# Patient Record
Sex: Female | Born: 1950 | Race: Black or African American | Hispanic: No | Marital: Married | Smoking: Former smoker
Health system: Southern US, Community
[De-identification: ages and names within clinical notes are randomized; demographics above are authoritative.]

## PROBLEM LIST (undated history)

## (undated) DIAGNOSIS — E785 Hyperlipidemia, unspecified: Secondary | ICD-10-CM

## (undated) DIAGNOSIS — M1990 Unspecified osteoarthritis, unspecified site: Secondary | ICD-10-CM

## (undated) DIAGNOSIS — M4316 Spondylolisthesis, lumbar region: Secondary | ICD-10-CM

## (undated) DIAGNOSIS — I10 Essential (primary) hypertension: Secondary | ICD-10-CM

## (undated) DIAGNOSIS — R159 Full incontinence of feces: Secondary | ICD-10-CM

## (undated) DIAGNOSIS — Z973 Presence of spectacles and contact lenses: Secondary | ICD-10-CM

## (undated) DIAGNOSIS — N186 End stage renal disease: Secondary | ICD-10-CM

## (undated) DIAGNOSIS — Z9289 Personal history of other medical treatment: Secondary | ICD-10-CM

## (undated) DIAGNOSIS — Z87898 Personal history of other specified conditions: Secondary | ICD-10-CM

## (undated) DIAGNOSIS — Z87448 Personal history of other diseases of urinary system: Secondary | ICD-10-CM

## (undated) DIAGNOSIS — H401130 Primary open-angle glaucoma, bilateral, stage unspecified: Secondary | ICD-10-CM

## (undated) DIAGNOSIS — M48061 Spinal stenosis, lumbar region without neurogenic claudication: Secondary | ICD-10-CM

## (undated) DIAGNOSIS — H409 Unspecified glaucoma: Secondary | ICD-10-CM

## (undated) DIAGNOSIS — E119 Type 2 diabetes mellitus without complications: Secondary | ICD-10-CM

## (undated) DIAGNOSIS — M545 Low back pain, unspecified: Secondary | ICD-10-CM

## (undated) DIAGNOSIS — Z972 Presence of dental prosthetic device (complete) (partial): Secondary | ICD-10-CM

## (undated) DIAGNOSIS — N3946 Mixed incontinence: Secondary | ICD-10-CM

## (undated) DIAGNOSIS — N189 Chronic kidney disease, unspecified: Secondary | ICD-10-CM

## (undated) DIAGNOSIS — M810 Age-related osteoporosis without current pathological fracture: Secondary | ICD-10-CM

## (undated) DIAGNOSIS — D631 Anemia in chronic kidney disease: Secondary | ICD-10-CM

## (undated) DIAGNOSIS — G8929 Other chronic pain: Secondary | ICD-10-CM

## (undated) DIAGNOSIS — D849 Immunodeficiency, unspecified: Secondary | ICD-10-CM

## (undated) DIAGNOSIS — K219 Gastro-esophageal reflux disease without esophagitis: Secondary | ICD-10-CM

## (undated) DIAGNOSIS — N3941 Urge incontinence: Secondary | ICD-10-CM

## (undated) DIAGNOSIS — R55 Syncope and collapse: Secondary | ICD-10-CM

## (undated) DIAGNOSIS — N952 Postmenopausal atrophic vaginitis: Secondary | ICD-10-CM

## (undated) HISTORY — DX: Anemia in chronic kidney disease: D63.1

## (undated) HISTORY — PX: TUBAL LIGATION: SHX77

## (undated) HISTORY — DX: Gastro-esophageal reflux disease without esophagitis: K21.9

## (undated) HISTORY — PX: CATARACT EXTRACTION: SUR2

## (undated) HISTORY — DX: Hyperlipidemia, unspecified: E78.5

## (undated) HISTORY — DX: Urge incontinence: N39.41

## (undated) HISTORY — DX: Type 2 diabetes mellitus without complications: E11.9

## (undated) HISTORY — DX: Postmenopausal atrophic vaginitis: N95.2

## (undated) HISTORY — DX: End stage renal disease: N18.6

## (undated) HISTORY — PX: REFRACTIVE SURGERY: SHX103

## (undated) HISTORY — DX: Unspecified osteoarthritis, unspecified site: M19.90

## (undated) HISTORY — PX: NEPHRECTOMY TRANSPLANTED ORGAN: SUR880

## (undated) HISTORY — DX: Chronic kidney disease, unspecified: N18.9

---
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
## 1999-10-03 DIAGNOSIS — H409 Unspecified glaucoma: Secondary | ICD-10-CM

## 1999-10-03 DIAGNOSIS — H401133 Primary open-angle glaucoma, bilateral, severe stage: Secondary | ICD-10-CM | POA: Insufficient documentation

## 2003-07-27 ENCOUNTER — Encounter: Payer: Self-pay | Admitting: Family Medicine

## 2003-07-27 ENCOUNTER — Other Ambulatory Visit: Admission: RE | Admit: 2003-07-27 | Discharge: 2003-07-27 | Payer: Self-pay | Admitting: Internal Medicine

## 2004-06-13 ENCOUNTER — Encounter: Payer: Self-pay | Admitting: Family Medicine

## 2004-06-13 LAB — CONVERTED CEMR LAB: Hgb A1c MFr Bld: 6.7 %

## 2004-08-05 ENCOUNTER — Ambulatory Visit: Payer: Self-pay | Admitting: Family Medicine

## 2004-08-05 LAB — CONVERTED CEMR LAB: Hgb A1c MFr Bld: 6.5 %

## 2004-08-08 ENCOUNTER — Ambulatory Visit: Payer: Self-pay | Admitting: Family Medicine

## 2004-09-08 ENCOUNTER — Ambulatory Visit: Payer: Self-pay | Admitting: Family Medicine

## 2004-09-09 ENCOUNTER — Encounter: Payer: Self-pay | Admitting: Family Medicine

## 2004-09-09 LAB — CONVERTED CEMR LAB: Hgb A1c MFr Bld: 6.1 %

## 2004-10-21 ENCOUNTER — Ambulatory Visit: Payer: Self-pay | Admitting: Family Medicine

## 2004-11-21 ENCOUNTER — Encounter: Payer: Self-pay | Admitting: Family Medicine

## 2004-12-05 ENCOUNTER — Ambulatory Visit: Payer: Self-pay | Admitting: Family Medicine

## 2004-12-07 ENCOUNTER — Ambulatory Visit: Payer: Self-pay | Admitting: Family Medicine

## 2004-12-12 ENCOUNTER — Ambulatory Visit: Payer: Self-pay | Admitting: Family Medicine

## 2005-03-07 ENCOUNTER — Ambulatory Visit: Payer: Self-pay | Admitting: Family Medicine

## 2005-03-07 LAB — CONVERTED CEMR LAB: Hgb A1c MFr Bld: 5.6 %

## 2005-03-20 ENCOUNTER — Ambulatory Visit: Payer: Self-pay | Admitting: Family Medicine

## 2005-06-15 ENCOUNTER — Ambulatory Visit: Payer: Self-pay | Admitting: Family Medicine

## 2005-06-20 ENCOUNTER — Ambulatory Visit: Payer: Self-pay | Admitting: Family Medicine

## 2005-07-02 ENCOUNTER — Encounter: Payer: Self-pay | Admitting: Family Medicine

## 2005-07-02 LAB — CONVERTED CEMR LAB: Microalbumin U total vol: 105.5 mg/L

## 2005-07-28 ENCOUNTER — Ambulatory Visit: Payer: Self-pay | Admitting: Family Medicine

## 2005-07-28 LAB — CONVERTED CEMR LAB: Hgb A1c MFr Bld: 5.8 %

## 2005-08-01 ENCOUNTER — Ambulatory Visit: Payer: Self-pay | Admitting: Family Medicine

## 2005-10-17 ENCOUNTER — Ambulatory Visit: Payer: Self-pay | Admitting: Family Medicine

## 2005-11-10 ENCOUNTER — Ambulatory Visit: Payer: Self-pay | Admitting: Family Medicine

## 2005-11-24 ENCOUNTER — Ambulatory Visit: Payer: Self-pay | Admitting: Family Medicine

## 2005-11-27 ENCOUNTER — Ambulatory Visit: Payer: Self-pay | Admitting: Family Medicine

## 2006-01-10 ENCOUNTER — Ambulatory Visit: Payer: Self-pay | Admitting: Family Medicine

## 2006-03-12 ENCOUNTER — Ambulatory Visit: Payer: Self-pay | Admitting: Family Medicine

## 2006-04-26 ENCOUNTER — Encounter: Admission: RE | Admit: 2006-04-26 | Discharge: 2006-04-26 | Payer: Self-pay | Admitting: Orthopaedic Surgery

## 2006-05-31 ENCOUNTER — Encounter: Admission: RE | Admit: 2006-05-31 | Discharge: 2006-05-31 | Payer: Self-pay | Admitting: Orthopaedic Surgery

## 2006-06-22 ENCOUNTER — Encounter: Admission: RE | Admit: 2006-06-22 | Discharge: 2006-06-22 | Payer: Self-pay | Admitting: *Deleted

## 2006-10-25 ENCOUNTER — Ambulatory Visit: Payer: Self-pay | Admitting: Family Medicine

## 2006-11-06 ENCOUNTER — Ambulatory Visit: Payer: Self-pay | Admitting: Family Medicine

## 2007-01-03 ENCOUNTER — Encounter: Payer: Self-pay | Admitting: Family Medicine

## 2007-01-03 DIAGNOSIS — M1990 Unspecified osteoarthritis, unspecified site: Secondary | ICD-10-CM | POA: Insufficient documentation

## 2007-07-10 ENCOUNTER — Encounter (INDEPENDENT_AMBULATORY_CARE_PROVIDER_SITE_OTHER): Payer: Self-pay | Admitting: *Deleted

## 2007-07-11 ENCOUNTER — Ambulatory Visit: Payer: Self-pay | Admitting: Family Medicine

## 2007-07-26 ENCOUNTER — Encounter: Payer: Self-pay | Admitting: Family Medicine

## 2007-07-29 ENCOUNTER — Encounter (INDEPENDENT_AMBULATORY_CARE_PROVIDER_SITE_OTHER): Payer: Self-pay | Admitting: *Deleted

## 2007-10-28 ENCOUNTER — Ambulatory Visit: Payer: Self-pay | Admitting: Family Medicine

## 2007-10-28 LAB — CONVERTED CEMR LAB
Albumin: 4.1 g/dL (ref 3.5–5.2)
BUN: 45 mg/dL — ABNORMAL HIGH (ref 6–23)
CO2: 24 meq/L (ref 19–32)
Calcium: 9.9 mg/dL (ref 8.4–10.5)
Chloride: 105 meq/L (ref 96–112)
Creatinine, Ser: 2.4 mg/dL — ABNORMAL HIGH (ref 0.4–1.2)
Creatinine,U: 254.8 mg/dL
Direct LDL: 133.5 mg/dL
GFR calc non Af Amer: 22 mL/min
Hgb A1c MFr Bld: 6.4 % — ABNORMAL HIGH (ref 4.6–6.0)
Microalb Creat Ratio: 305.7 mg/g — ABNORMAL HIGH (ref 0.0–30.0)
Microalb, Ur: 77.9 mg/dL — ABNORMAL HIGH (ref 0.0–1.9)
Triglycerides: 139 mg/dL (ref 0–149)

## 2007-10-30 ENCOUNTER — Ambulatory Visit: Payer: Self-pay | Admitting: Family Medicine

## 2007-10-30 ENCOUNTER — Encounter: Payer: Self-pay | Admitting: Family Medicine

## 2007-10-30 ENCOUNTER — Other Ambulatory Visit: Admission: RE | Admit: 2007-10-30 | Discharge: 2007-10-30 | Payer: Self-pay | Admitting: Family Medicine

## 2007-10-30 LAB — CONVERTED CEMR LAB: Pap Smear: NORMAL

## 2007-11-04 ENCOUNTER — Encounter (INDEPENDENT_AMBULATORY_CARE_PROVIDER_SITE_OTHER): Payer: Self-pay | Admitting: *Deleted

## 2007-11-19 LAB — CONVERTED CEMR LAB: OCCULT 3: NEGATIVE

## 2007-12-06 ENCOUNTER — Ambulatory Visit: Payer: Self-pay | Admitting: Family Medicine

## 2007-12-09 ENCOUNTER — Ambulatory Visit: Payer: Self-pay | Admitting: Family Medicine

## 2007-12-09 ENCOUNTER — Encounter (INDEPENDENT_AMBULATORY_CARE_PROVIDER_SITE_OTHER): Payer: Self-pay | Admitting: *Deleted

## 2007-12-15 LAB — CONVERTED CEMR LAB: AST: 23 units/L (ref 0–37)

## 2008-01-27 ENCOUNTER — Ambulatory Visit: Payer: Self-pay | Admitting: Family Medicine

## 2008-01-27 LAB — CONVERTED CEMR LAB
BUN: 42 mg/dL — ABNORMAL HIGH (ref 6–23)
Cholesterol: 129 mg/dL (ref 0–200)
Creatinine, Ser: 2.3 mg/dL — ABNORMAL HIGH (ref 0.4–1.2)
GFR calc Af Amer: 28 mL/min
GFR calc non Af Amer: 23 mL/min
Glucose, Bld: 100 mg/dL — ABNORMAL HIGH (ref 70–99)
LDL Cholesterol: 75 mg/dL (ref 0–99)
Potassium: 3.9 meq/L (ref 3.5–5.1)
Triglycerides: 63 mg/dL (ref 0–149)
VLDL: 13 mg/dL (ref 0–40)

## 2008-02-03 ENCOUNTER — Ambulatory Visit: Payer: Self-pay | Admitting: Family Medicine

## 2008-02-18 ENCOUNTER — Ambulatory Visit: Payer: Self-pay | Admitting: Nephrology

## 2008-02-20 ENCOUNTER — Telehealth: Payer: Self-pay | Admitting: Family Medicine

## 2008-02-25 ENCOUNTER — Ambulatory Visit: Payer: Self-pay | Admitting: Family Medicine

## 2008-03-06 ENCOUNTER — Encounter: Payer: Self-pay | Admitting: Family Medicine

## 2008-03-19 ENCOUNTER — Encounter: Payer: Self-pay | Admitting: Family Medicine

## 2008-03-30 ENCOUNTER — Encounter: Payer: Self-pay | Admitting: Family Medicine

## 2008-04-09 ENCOUNTER — Ambulatory Visit: Payer: Self-pay | Admitting: Family Medicine

## 2008-05-18 ENCOUNTER — Ambulatory Visit: Payer: Self-pay | Admitting: Family Medicine

## 2008-05-18 LAB — CONVERTED CEMR LAB: Hgb A1c MFr Bld: 6.6 % — ABNORMAL HIGH (ref 4.6–6.0)

## 2008-05-25 ENCOUNTER — Ambulatory Visit: Payer: Self-pay | Admitting: Family Medicine

## 2008-06-17 ENCOUNTER — Encounter: Payer: Self-pay | Admitting: Family Medicine

## 2008-07-31 ENCOUNTER — Ambulatory Visit: Payer: Self-pay | Admitting: Internal Medicine

## 2008-07-31 DIAGNOSIS — R42 Dizziness and giddiness: Secondary | ICD-10-CM | POA: Insufficient documentation

## 2008-08-04 ENCOUNTER — Telehealth (INDEPENDENT_AMBULATORY_CARE_PROVIDER_SITE_OTHER): Payer: Self-pay | Admitting: Internal Medicine

## 2008-08-18 ENCOUNTER — Encounter: Payer: Self-pay | Admitting: Family Medicine

## 2008-08-19 ENCOUNTER — Ambulatory Visit: Payer: Self-pay | Admitting: Family Medicine

## 2008-08-19 LAB — CONVERTED CEMR LAB
ALT: 18 units/L (ref 0–35)
AST: 19 units/L (ref 0–37)
Chloride: 109 meq/L (ref 96–112)
GFR calc Af Amer: 27 mL/min
GFR calc non Af Amer: 22 mL/min
Glucose, Bld: 69 mg/dL — ABNORMAL LOW (ref 70–99)
Potassium: 3.7 meq/L (ref 3.5–5.1)
Sodium: 141 meq/L (ref 135–145)

## 2008-08-25 ENCOUNTER — Ambulatory Visit: Payer: Self-pay | Admitting: Family Medicine

## 2008-10-05 ENCOUNTER — Ambulatory Visit: Payer: Self-pay | Admitting: Family Medicine

## 2008-10-05 DIAGNOSIS — L659 Nonscarring hair loss, unspecified: Secondary | ICD-10-CM | POA: Insufficient documentation

## 2008-11-23 ENCOUNTER — Ambulatory Visit: Payer: Self-pay | Admitting: Family Medicine

## 2008-11-23 LAB — CONVERTED CEMR LAB: TSH: 2.14 microintl units/mL (ref 0.35–5.50)

## 2008-12-07 ENCOUNTER — Encounter: Payer: Self-pay | Admitting: Family Medicine

## 2008-12-09 ENCOUNTER — Encounter: Payer: Self-pay | Admitting: Family Medicine

## 2008-12-31 ENCOUNTER — Encounter: Payer: Self-pay | Admitting: Internal Medicine

## 2009-01-18 ENCOUNTER — Ambulatory Visit: Payer: Self-pay | Admitting: Internal Medicine

## 2009-01-19 ENCOUNTER — Encounter: Payer: Self-pay | Admitting: Family Medicine

## 2009-02-04 ENCOUNTER — Ambulatory Visit: Payer: Self-pay | Admitting: Internal Medicine

## 2009-02-04 HISTORY — PX: COLONOSCOPY: SHX174

## 2009-02-22 ENCOUNTER — Ambulatory Visit: Payer: Self-pay | Admitting: Family Medicine

## 2009-02-22 DIAGNOSIS — R252 Cramp and spasm: Secondary | ICD-10-CM | POA: Insufficient documentation

## 2009-02-25 ENCOUNTER — Telehealth: Payer: Self-pay | Admitting: Family Medicine

## 2009-04-06 ENCOUNTER — Ambulatory Visit: Payer: Self-pay | Admitting: Family Medicine

## 2009-04-12 ENCOUNTER — Encounter: Payer: Self-pay | Admitting: Family Medicine

## 2009-04-30 ENCOUNTER — Telehealth (INDEPENDENT_AMBULATORY_CARE_PROVIDER_SITE_OTHER): Payer: Self-pay | Admitting: Internal Medicine

## 2009-05-05 ENCOUNTER — Telehealth (INDEPENDENT_AMBULATORY_CARE_PROVIDER_SITE_OTHER): Payer: Self-pay | Admitting: *Deleted

## 2009-05-17 ENCOUNTER — Ambulatory Visit: Payer: Self-pay | Admitting: Family Medicine

## 2009-05-19 ENCOUNTER — Ambulatory Visit: Payer: Self-pay | Admitting: Family Medicine

## 2009-05-19 DIAGNOSIS — E559 Vitamin D deficiency, unspecified: Secondary | ICD-10-CM | POA: Insufficient documentation

## 2009-05-19 LAB — CONVERTED CEMR LAB
Albumin: 3.7 g/dL (ref 3.5–5.2)
Alkaline Phosphatase: 59 units/L (ref 39–117)
BUN: 49 mg/dL — ABNORMAL HIGH (ref 6–23)
Basophils Absolute: 0.1 10*3/uL (ref 0.0–0.1)
Bilirubin, Direct: 0.1 mg/dL (ref 0.0–0.3)
CO2: 26 meq/L (ref 19–32)
Calcium: 9.5 mg/dL (ref 8.4–10.5)
Creatinine, Ser: 2.3 mg/dL — ABNORMAL HIGH (ref 0.4–1.2)
Direct LDL: 163.2 mg/dL
Eosinophils Absolute: 0.1 10*3/uL (ref 0.0–0.7)
GFR calc non Af Amer: 28.14 mL/min (ref >60)
Glucose, Bld: 74 mg/dL (ref 70–99)
HDL: 41.1 mg/dL (ref >39.00)
Hgb A1c MFr Bld: 6.1 % (ref 4.6–6.5)
Lymphocytes Relative: 26.3 % (ref 12.0–46.0)
MCHC: 33.6 g/dL (ref 30.0–36.0)
Monocytes Relative: 10.9 % (ref 3.0–12.0)
Neutro Abs: 3 10*3/uL (ref 1.4–7.7)
Neutrophils Relative %: 59.3 % (ref 43.0–77.0)
Platelets: 189 10*3/uL (ref 150.0–400.0)
RDW: 13.3 % (ref 11.5–14.6)
Total Bilirubin: 0.9 mg/dL (ref 0.3–1.2)
Triglycerides: 126 mg/dL (ref 0.0–149.0)
VLDL: 25.2 mg/dL (ref 0.0–40.0)

## 2009-06-18 ENCOUNTER — Encounter: Payer: Self-pay | Admitting: Family Medicine

## 2009-06-24 ENCOUNTER — Ambulatory Visit: Payer: Self-pay | Admitting: Family Medicine

## 2009-06-24 ENCOUNTER — Other Ambulatory Visit: Admission: RE | Admit: 2009-06-24 | Discharge: 2009-06-24 | Payer: Self-pay | Admitting: Family Medicine

## 2009-06-24 ENCOUNTER — Encounter: Payer: Self-pay | Admitting: Family Medicine

## 2009-06-24 LAB — CONVERTED CEMR LAB: Pap Smear: NORMAL

## 2009-06-28 ENCOUNTER — Encounter: Payer: Self-pay | Admitting: Family Medicine

## 2009-06-30 ENCOUNTER — Encounter (INDEPENDENT_AMBULATORY_CARE_PROVIDER_SITE_OTHER): Payer: Self-pay | Admitting: *Deleted

## 2009-07-05 ENCOUNTER — Encounter: Payer: Self-pay | Admitting: Family Medicine

## 2009-08-10 ENCOUNTER — Encounter: Payer: Self-pay | Admitting: Family Medicine

## 2009-08-17 ENCOUNTER — Ambulatory Visit: Payer: Self-pay | Admitting: Family Medicine

## 2009-08-17 LAB — CONVERTED CEMR LAB
AST: 18 units/L (ref 0–37)
Cholesterol: 172 mg/dL (ref 0–200)
Triglycerides: 108 mg/dL (ref 0.0–149.0)
VLDL: 21.6 mg/dL (ref 0.0–40.0)

## 2009-08-23 ENCOUNTER — Ambulatory Visit: Payer: Self-pay | Admitting: Family Medicine

## 2009-09-20 ENCOUNTER — Telehealth: Payer: Self-pay | Admitting: Family Medicine

## 2009-10-02 HISTORY — PX: BLADDER SUSPENSION: SHX72

## 2009-10-04 ENCOUNTER — Encounter: Payer: Self-pay | Admitting: Family Medicine

## 2009-10-26 ENCOUNTER — Ambulatory Visit: Payer: Self-pay | Admitting: Family Medicine

## 2009-10-26 DIAGNOSIS — R159 Full incontinence of feces: Secondary | ICD-10-CM | POA: Insufficient documentation

## 2009-10-27 LAB — CONVERTED CEMR LAB
CO2: 26 meq/L (ref 19–32)
Calcium: 9.3 mg/dL (ref 8.4–10.5)
Glucose, Bld: 173 mg/dL — ABNORMAL HIGH (ref 70–99)
Potassium: 4.1 meq/L (ref 3.5–5.1)
Sodium: 139 meq/L (ref 135–145)

## 2009-10-28 ENCOUNTER — Telehealth: Payer: Self-pay | Admitting: Family Medicine

## 2009-11-02 ENCOUNTER — Ambulatory Visit (HOSPITAL_BASED_OUTPATIENT_CLINIC_OR_DEPARTMENT_OTHER): Admission: RE | Admit: 2009-11-02 | Discharge: 2009-11-02 | Payer: Self-pay | Admitting: Urology

## 2009-11-23 ENCOUNTER — Encounter: Payer: Self-pay | Admitting: Family Medicine

## 2009-12-06 ENCOUNTER — Ambulatory Visit: Payer: Self-pay | Admitting: Family Medicine

## 2009-12-06 LAB — CONVERTED CEMR LAB
Albumin: 3.9 g/dL (ref 3.5–5.2)
Calcium: 9.4 mg/dL (ref 8.4–10.5)
Chloride: 110 meq/L (ref 96–112)
Potassium: 3.9 meq/L (ref 3.5–5.1)

## 2009-12-07 ENCOUNTER — Encounter: Payer: Self-pay | Admitting: Family Medicine

## 2009-12-07 LAB — CONVERTED CEMR LAB: Vit D, 25-Hydroxy: 35 ng/mL (ref 30–89)

## 2009-12-09 ENCOUNTER — Ambulatory Visit: Payer: Self-pay | Admitting: Family Medicine

## 2010-02-21 ENCOUNTER — Encounter: Payer: Self-pay | Admitting: Family Medicine

## 2010-04-06 ENCOUNTER — Encounter: Payer: Self-pay | Admitting: Family Medicine

## 2010-04-18 ENCOUNTER — Telehealth: Payer: Self-pay | Admitting: Family Medicine

## 2010-04-20 ENCOUNTER — Encounter: Payer: Self-pay | Admitting: Family Medicine

## 2010-05-09 ENCOUNTER — Encounter (INDEPENDENT_AMBULATORY_CARE_PROVIDER_SITE_OTHER): Payer: Self-pay | Admitting: *Deleted

## 2010-06-14 ENCOUNTER — Ambulatory Visit: Payer: Self-pay | Admitting: Internal Medicine

## 2010-06-14 DIAGNOSIS — M25529 Pain in unspecified elbow: Secondary | ICD-10-CM | POA: Insufficient documentation

## 2010-06-14 DIAGNOSIS — M79609 Pain in unspecified limb: Secondary | ICD-10-CM | POA: Insufficient documentation

## 2010-06-30 ENCOUNTER — Encounter: Payer: Self-pay | Admitting: Family Medicine

## 2010-07-06 ENCOUNTER — Encounter: Payer: Self-pay | Admitting: Family Medicine

## 2010-07-06 LAB — HM MAMMOGRAPHY: HM Mammogram: NORMAL

## 2010-07-10 ENCOUNTER — Emergency Department: Payer: Self-pay | Admitting: Internal Medicine

## 2010-07-10 ENCOUNTER — Encounter: Payer: Self-pay | Admitting: Family Medicine

## 2010-07-10 LAB — CONVERTED CEMR LAB
ALT: 24 units/L
CO2: 20 meq/L
Calcium: 9.5 mg/dL
Chloride: 110 meq/L
Platelets: 214 10*3/uL
Sodium: 142 meq/L
Total Protein: 7.9 g/dL
WBC: 9.9 10*3/uL

## 2010-07-11 ENCOUNTER — Ambulatory Visit: Payer: Self-pay | Admitting: Internal Medicine

## 2010-07-11 ENCOUNTER — Encounter: Payer: Self-pay | Admitting: Family Medicine

## 2010-07-11 DIAGNOSIS — R55 Syncope and collapse: Secondary | ICD-10-CM | POA: Insufficient documentation

## 2010-07-14 ENCOUNTER — Encounter: Payer: Self-pay | Admitting: Family Medicine

## 2010-07-14 ENCOUNTER — Other Ambulatory Visit: Admission: RE | Admit: 2010-07-14 | Discharge: 2010-07-14 | Payer: Self-pay | Admitting: Family Medicine

## 2010-07-14 ENCOUNTER — Ambulatory Visit: Payer: Self-pay | Admitting: Internal Medicine

## 2010-07-14 LAB — CONVERTED CEMR LAB: Pap Smear: NORMAL

## 2010-07-22 ENCOUNTER — Encounter: Payer: Self-pay | Admitting: Family Medicine

## 2010-07-22 ENCOUNTER — Encounter (INDEPENDENT_AMBULATORY_CARE_PROVIDER_SITE_OTHER): Payer: Self-pay | Admitting: *Deleted

## 2010-07-25 ENCOUNTER — Encounter: Payer: Self-pay | Admitting: Family Medicine

## 2010-09-01 ENCOUNTER — Encounter: Payer: Self-pay | Admitting: Family Medicine

## 2010-10-12 ENCOUNTER — Telehealth (INDEPENDENT_AMBULATORY_CARE_PROVIDER_SITE_OTHER): Payer: Self-pay | Admitting: *Deleted

## 2010-10-17 ENCOUNTER — Other Ambulatory Visit: Payer: Self-pay | Admitting: Family Medicine

## 2010-10-17 ENCOUNTER — Ambulatory Visit
Admission: RE | Admit: 2010-10-17 | Discharge: 2010-10-17 | Payer: Self-pay | Source: Home / Self Care | Attending: Family Medicine | Admitting: Family Medicine

## 2010-10-17 LAB — LIPID PANEL
Cholesterol: 169 mg/dL (ref 0–200)
HDL: 46.8 mg/dL (ref >39.00)
LDL Cholesterol: 112 mg/dL — ABNORMAL HIGH (ref 0–99)
Total CHOL/HDL Ratio: 4
Triglycerides: 51 mg/dL (ref 0.0–149.0)
VLDL: 10.2 mg/dL (ref 0.0–40.0)

## 2010-10-17 LAB — HEPATIC FUNCTION PANEL
ALT: 18 U/L (ref 0–35)
AST: 21 U/L (ref 0–37)
Albumin: 4 g/dL (ref 3.5–5.2)
Alkaline Phosphatase: 60 U/L (ref 39–117)
Bilirubin, Direct: 0.1 mg/dL (ref 0.0–0.3)
Total Bilirubin: 0.8 mg/dL (ref 0.3–1.2)
Total Protein: 7 g/dL (ref 6.0–8.3)

## 2010-10-17 LAB — BASIC METABOLIC PANEL
BUN: 56 mg/dL — ABNORMAL HIGH (ref 6–23)
CO2: 26 mEq/L (ref 19–32)
Calcium: 9.5 mg/dL (ref 8.4–10.5)
Chloride: 106 mEq/L (ref 96–112)
Creatinine, Ser: 2.9 mg/dL — ABNORMAL HIGH (ref 0.4–1.2)
GFR: 21.77 mL/min — ABNORMAL LOW (ref >60.00)
Glucose, Bld: 97 mg/dL (ref 70–99)
Potassium: 4.4 mEq/L (ref 3.5–5.1)
Sodium: 140 mEq/L (ref 135–145)

## 2010-10-17 LAB — HEMOGLOBIN A1C: Hgb A1c MFr Bld: 6.6 % — ABNORMAL HIGH (ref 4.6–6.5)

## 2010-10-24 ENCOUNTER — Ambulatory Visit
Admission: RE | Admit: 2010-10-24 | Discharge: 2010-10-24 | Payer: Self-pay | Source: Home / Self Care | Attending: Family Medicine | Admitting: Family Medicine

## 2010-11-01 NOTE — Assessment & Plan Note (Signed)
Summary: DISCHARGE/CLE   Vital Signs:  Patient profile:   60 year old female Weight:      170 pounds Temp:     98.2 degrees F oral Pulse rate:   64 / minute Pulse rhythm:   regular BP sitting:   144 / 74  (left arm) Cuff size:   large  Vitals Entered By: Emelia Salisbury LPN (January 25, 624THL 1:36 PM) CC: After urinating when she wipes has noticed stool on tissue paper and has not had a BM   History of Present Illness: When pt goes to BR, she will have stool when wiping at times when she is unaware of stool. Shre has started on a eyedrop recently, she does not have them with her.  Her stool has been lose.  She also has been having worse leg cramps. She drinks tonic water and was taken off all Vitamins until after her bladder support surgery. She had been tolerating her Vit D without difficulty.  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 Take One By Mouth Once A Day 2)  Multivitamins   Tabs (Multiple Vitamin) .... Take One By Mouth Once A Day 3)  Vitamin C and Vitamin E 4)  Lipitor 80 Mg  Tabs (Atorvastatin Calcium) .... One Tab  By Mouth  At Bedtime 5)  Hyzaar 100-12.5 Mg  Tabs (Losartan Potassium-Hctz) .... Take One By Mouth Every Evening 6)  Norvasc 5 Mg  Tabs (Amlodipine Besylate) .... Take One By Mouth Every Evening 7)  Ditropan Xl 10 Mg  Tb24 (Oxybutynin Chloride) .... One Tab By Mouth Daily By Mouth 8)  Carvedilol 6.25 Mg Tabs (Carvedilol) .... One Tab By Mouth Two Times A Day 9)  Amaryl 2 Mg Tabs (Glimepiride) .... One Tab By Mouth Every Am 10)  Meclizine Hcl 25 Mg Tabs (Meclizine Hcl) .Marland Kitchen.. 1 Every 8 Hrs As Needed For Dizziness By Mouth 11)  Pravastatin Sodium 80 Mg Tabs (Pravastatin Sodium) .... One Tab By Mouth At Night 12)  Vitamin D (Ergocalciferol) 50000 Unit Caps (Ergocalciferol) .... One Tab By Mouth Once A Week.  Allergies: No Known Drug Allergies  Physical Exam  General:  Well-developed,well-nourished,in no acute distress; alert,appropriate and cooperative throughout examination Head:  Normocephalic and atraumatic without obvious abnormalities. Significant  alopecia and  thinnning of the hair, appears no worse.. Eyes:  Conjunctiva clear bilaterally.  Ears:  External ear exam shows no significant lesions or deformities.  Otoscopic examination reveals clear canals, tympanic membranes are intact bilaterally without bulging, retraction, inflammation or discharge. Hearing is grossly normal bilaterally. Nose:  External nasal examination shows no deformity or inflammation. Nasal mucosa are pink and moist without lesions or  exudates. Mouth:  Oral mucosa and oropharynx without lesions or exudates.  Teeth in good repair. Neck:  No deformities, masses, or tenderness noted. Chest Wall:  No deformities, masses, or tenderness noted. Lungs:  Normal respiratory effort, chest expands symmetrically. Lungs are clear to auscultation, no crackles or wheezes. Heart:  Normal rate and regular rhythm. S1 and S2 normal without gallop, murmur, click, rub or other extra sounds.   Impression & Recommendations:  Problem # 1:   ENCOPRESIS (ICD-307.7) Assessment New Check on eye drops. Do Kegel's, try Citrucel to firm uip stool. Recheck one month.  Problem # 2:  MUSCLE CRAMPS (ICD-729.82) Assessment: Deteriorated Will check K, Mg, metabolites. Cont Quinine water. Orders: Venipuncture [id number]) TLB-BMP (Basic Metabolic Panel-BMET) (99991111) TLB-Magnesium (Mg) (83735-MG)  Complete Medication List: 1)  Nexium 40 Mg Cpdr (Esomeprazole magnesium) .... Take one by mouth once a day 2)  Multivitamins Tabs (Multiple vitamin) .... Take one by mouth once a day 3)  Vitamin C and Vitamin E  4)  Hyzaar 100-12.5 Mg Tabs (Losartan potassium-hctz) .... Take one by mouth every evening 5)  Norvasc 5 Mg Tabs (Amlodipine besylate) .... Take one by mouth every evening 6)  Ditropan Xl 10 Mg Tb24 (Oxybutynin chloride) .... One tab by mouth daily by mouth 7)  Carvedilol 6.25 Mg Tabs (Carvedilol) .... One tab by mouth two times a day 8)  Amaryl 2 Mg Tabs (Glimepiride) .... One tab by mouth every am 9)  Meclizine Hcl 25 Mg Tabs (Meclizine hcl) .Marland Kitchen.. 1 every 8 hrs as needed for dizziness by mouth 10)  Pravastatin Sodium 80 Mg Tabs (Pravastatin sodium) .... One tab by mouth at night 11)  Vitamin D (ergocalciferol) 50000 Unit Caps (Ergocalciferol) .... One tab by mouth once a week.  Patient Instructions: 1)  RTC one month.  2)  Try Citrucel, one tsp in 4 oz of water regularly in the AM.  3)  Also do Kegel exercises, 15 at a time three times a day. 4)  Check on K and Mg for leg cramps.  Current Allergies (reviewed today): No known allergies

## 2010-11-01 NOTE — Assessment & Plan Note (Signed)
Summary: CPX / LFW   Vital Signs:  Patient profile:   60 year old female Height:      63 inches Weight:      158.75 pounds Temp:     98.2 degrees F oral Pulse rate:   64 / minute Pulse rhythm:   regular BP sitting:   118 / 80  (left arm) Cuff size:   regular  Vitals Entered By: Maudie Mercury Dance CMA Deborra Medina) (July 14, 2010 8:37 AM) CC: CPx   History of Present Illness: CC:CPE  preventative - had flu shot this year.  up to date on tetanus.  h/o normal pap smears last 2 years and in past.  had mammogram last week and told normal.  colonoscopy 2010 WNL Olevia Perches).  DM - off glimepiride.  hasn't felt any other lows.    Oh by the way.Marland KitchenMarland KitchenStool - has some bowel accidents when passes gas, has been happening over last year.  Also accidents when goes to void.  mild constipation, no diarrhea.    -  Date:  07/06/2010    Mammogram normal   Date:  06/16/2010    Flu vaccine given    Diabetes Eye Exam done per patient  Current Medications (verified): 1)  Nexium 40 Mg  Cpdr (Esomeprazole Magnesium) .... Take One By Mouth Once A Day 2)  Multivitamins   Tabs (Multiple Vitamin) .... Take One By Mouth Once A Day 3)  Vitamin C and Vitamin E 4)  Hyzaar 100-12.5 Mg  Tabs (Losartan Potassium-Hctz) .... Take One By Mouth Every Evening 5)  Ditropan Xl 10 Mg  Tb24 (Oxybutynin Chloride) .... One Tab By Mouth Daily By Mouth 6)  Carvedilol 6.25 Mg Tabs (Carvedilol) .... One Tab By Mouth Two Times A Day 7)  Meclizine Hcl 25 Mg Tabs (Meclizine Hcl) .Marland Kitchen.. 1 Every 8 Hrs As Needed For Dizziness By Mouth 8)  Pravastatin Sodium 80 Mg Tabs (Pravastatin Sodium) .... One Tab By Mouth At Night 9)  Vitamin D3 1000 Unit Caps (Cholecalciferol) .... One Tab By Mouth Two Times A Day 10)  Amlodipine Besylate 10 Mg Tabs (Amlodipine Besylate) .... Take 1 Tablet By Mouth Once A Day  Allergies (verified): No Known Drug Allergies  Past History:  Past Medical History: T2DM (currently diet controlled since losing  weight) GERD HLD Hypertension  pre-2003 CKD stage 4 per [facility] renal (Dr. Alfonse Alpers), considering transplant  Past Surgical History: BTL  Nuclear stress test-nl, EF 69% 4/01 2-D Echo neg 11/01 Bladder U/S Residual 41ml    Cystoscopy Nml   (Dr Matilde Sprang) 12/09/08 Colonoscopy Nml (Dr Olevia Perches ) 02/04/09                  10 yrs.  Social History: Reviewed history from 07/11/2010 and no changes required. No smoking (remote), no EtOH, no rec drugs Occupation: [employer], Corporate investment banker Single  divorced many years not active sexually, no desire Lives with husband, sister and niece, no pets Daughter in [place]       The patient complains of syncope.  The patient denies anorexia, fever, weight loss, weight gain, vision loss, decreased hearing, hoarseness, chest pain, dyspnea on exertion, peripheral edema, prolonged cough, headaches, hemoptysis, abdominal pain, melena, hematochezia, severe indigestion/heartburn, hematuria, muscle weakness, suspicious skin lesions, transient blindness, difficulty walking, depression, and breast masses.         eye exam - 06/2010 muscle cramps worsening syncope - last week [facility] ER visit, thought to be 2/2 hypoglycemia (see previous note)  Physical Exam  General:  Well-developed,well-nourished,in no acute distress; alert,appropriate and cooperative throughout examination Head:  Normocephalic and atraumatic without obvious abnormalities. No apparent alopecia or balding. Eyes:  No corneal or conjunctival inflammation noted. EOMI. Perrla. Ears:  External ear exam shows no significant lesions or deformities.  Otoscopic examination reveals clear canals, tympanic membranes are intact bilaterally without bulging, retraction, inflammation or discharge. Hearing is grossly normal bilaterally. Nose:  External nasal examination shows no deformity or inflammation. Nasal mucosa are pink and moist without lesions or exudates but anterior mucosa is mildly irritated,  no actual bleeding noted. Mouth:  Oral mucosa and oropharynx without lesions or exudates.  Teeth in good repair. Neck:  No deformities, masses, or tenderness noted. Breasts:  No mass, nodules, thickening, tenderness, bulging, retraction, inflamation, nipple discharge or skin changes noted. Mild fibrocystic changes with slight tissue depressions bilat in UOQ just outside the areola bilat. Lungs:  Normal respiratory effort, chest expands symmetrically. Lungs are clear to auscultation, no crackles or wheezes. Heart:  Normal rate and regular rhythm. S1 and S2 normal without gallop, murmur, click, rub or other extra sounds. Abdomen:  Bowel sounds positive,abdomen soft and non-tender without masses, organomegaly or hernias noted. Genitalia:  Pelvic Exam:        External: normal female genitalia without lesions or masses        Vagina: normal without lesions or masses        Cervix: normal without lesions or masses        Adnexa: normal bimanual exam without masses or fullness        Uterus: normal by palpation        Pap smear: performed Pulses:  2+ rad/DP pulses Extremities:  no edema Neurologic:  CN grossly intact, sensation and strength intact, gait and station intact Skin:  Intact without suspicious lesions or rashes Psych:  Cognition and judgment appear intact. Alert and cooperative with normal attention span and concentration. No apparent delusions, illusions, hallucinations.  full affect   Impression & Recommendations:  Problem # 1:  HEALTH MAINTENANCE EXAM (ICD-V70.0) performed today.  preventative protocols updated.  Problem # 2:  OTHER SCREENING MAMMOGRAM (ICD-V76.12) had mammogram last week, normal.  will obtain records.  Problem # 3:  ROUTINE GYNECOLOGICAL EXAMINATION (ICD-V72.31) with pap.  Problem # 4:  SCREENING FOR MALIGNANT NEOPLASM OF THE CERVIX (ICD-V76.2)  pap performed.  Orders: Pap Smear, Thin Prep ( Collection of) [id number])  Problem # 5:  DIABETES MELLITUS, TYPE  II (ICD-250.00)  off amaryl 2/2 hypoglycemia.  return 4-6 mo for f/u, recheck A1c. Her updated medication list for this problem includes:    Hyzaar 100-12.5 Mg Tabs (Losartan potassium-hctz) .Marland Kitchen... Take one by mouth every evening  Labs Reviewed: Creat: 2.81 (07/10/2010)   Microalbumin: 105.5 (07/02/2005)  Last Eye Exam: done per patient (06/16/2010) Reviewed HgBA1c results: 6.4 (12/06/2009)  6.1 (05/17/2009)  Her updated medication list for this problem includes:    Hyzaar 100-12.5 Mg Tabs (Losartan potassium-hctz) .Marland Kitchen... Take one by mouth every evening  Problem # 6:  HYPERCHOLESTEROLEMIA PRE-2003 (ICD-272.0)  still a bit high.  would not add fibrate, continue to monitor for now.  due for recheck next visit.  Her updated medication list for this problem includes:    Pravastatin Sodium 80 Mg Tabs (Pravastatin sodium) ..... One tab by mouth at night  Labs Reviewed: SGOT: 19 (07/10/2010)   SGPT: 24 (07/10/2010)   HDL:42.20 (08/17/2009), 41.10 (05/17/2009)  LDL:108 (08/17/2009), 75 (01/27/2008)  Chol:172 (08/17/2009), 224 (05/17/2009)  Trig:108.0 (  08/17/2009), 126.0 (05/17/2009)  Her updated medication list for this problem includes:    Pravastatin Sodium 80 Mg Tabs (Pravastatin sodium) ..... One tab by mouth at night  Problem # 7:  HYPERTENSION (ICD-401.9)  good contorl on current regimen.  conitnue Her updated medication list for this problem includes:    Amlodipine Besylate 10 Mg Tabs (Amlodipine besylate) .Marland Kitchen... Take 1 tablet by mouth once a day    Carvedilol 6.25 Mg Tabs (Carvedilol) ..... One tab by mouth two times a day    Hyzaar 100-12.5 Mg Tabs (Losartan potassium-hctz) .Marland Kitchen... Take one by mouth every evening  BP today: 118/80 Prior BP: 132/82 (07/11/2010)  Labs Reviewed: K+: 3.9 (07/10/2010) Creat: : 2.81 (07/10/2010)   Chol: 172 (08/17/2009)   HDL: 42.20 (08/17/2009)   LDL: 108 (08/17/2009)   TG: 108.0 (08/17/2009)  Her updated medication list for this problem  includes:    Amlodipine Besylate 10 Mg Tabs (Amlodipine besylate) .Marland Kitchen... Take 1 tablet by mouth once a day    Carvedilol 6.25 Mg Tabs (Carvedilol) ..... One tab by mouth two times a day    Hyzaar 100-12.5 Mg Tabs (Losartan potassium-hctz) .Marland Kitchen... Take one by mouth every evening  Problem # 8:  ENCOPRESIS (ICD-307.7) ? if constipation causing, trial of miralax.  recommend return in 2 weeks if not improved with this.  has been issue in past.  Complete Medication List: 1)  Amlodipine Besylate 10 Mg Tabs (Amlodipine besylate) .... Take 1 tablet by mouth once a day 2)  Carvedilol 6.25 Mg Tabs (Carvedilol) .... One tab by mouth two times a day 3)  Hyzaar 100-12.5 Mg Tabs (Losartan potassium-hctz) .... Take one by mouth every evening 4)  Pravastatin Sodium 80 Mg Tabs (Pravastatin sodium) .... One tab by mouth at night 5)  Nexium 40 Mg Cpdr (Esomeprazole magnesium) .... Take one by mouth once a day 6)  Multivitamins Tabs (Multiple vitamin) .... Take one by mouth once a day 7)  Vitamin C and Vitamin E  8)  Ditropan Xl 10 Mg Tb24 (Oxybutynin chloride) .... One tab by mouth daily by mouth 9)  Vitamin D3 1000 Unit Caps (Cholecalciferol) .... One tab by mouth two times a day 10)  Meclizine Hcl 25 Mg Tabs (Meclizine hcl) .Marland Kitchen.. 1 every 8 hrs as needed for dizziness by mouth 11)  Miralax Powd (Polyethylene glycol 3350) .Marland Kitchen.. 17gm in 8oz fluid daily for constipation  Patient Instructions: 1)  Check wth Dr. Alfonse Alpers about tonic water at bedtime for muscle cramps 2)  Return in 4-6 months for follow up on diabetes, sooner if needed. 3)  Good to see you today, call clinic with questions. Prescriptions: MIRALAX  POWD (POLYETHYLENE GLYCOL 3350) 17gm in 8oz fluid daily for constipation  #1 x 1   Entered and Authorized by:   Ria Bush  MD   Signed by:   Ria Bush  MD on 07/14/2010   Method used:   Electronically to        [facility] [phone]* (retail)       [place], [place]  [address]       Ph: [phone] or [phone]       Fax: [phone]   RxID:   [id number]   Current Allergies (reviewed today): No known allergies    Prevention & Chronic Care Immunizations   Influenza vaccine: given  (06/16/2010)   Influenza vaccine due: 06/03/2011  Tetanus booster: 10/30/2007: Tdap   Tetanus booster due: 10/29/2017    Pneumococcal vaccine: Not documented  Colorectal Screening   Hemoccult: negative  (12/06/2007)   Hemoccult action/deferral: Not indicated  (07/14/2010)   Hemoccult due: 12/05/2008    Colonoscopy: Location:  [facility].    (02/04/2009)   Colonoscopy due: 01/2019  Other Screening   Pap smear: Normal, Satisfactory  (06/24/2009)   Pap smear action/deferral: Ordered  (07/14/2010)   Pap smear due: 07/2010    Mammogram: normal   (07/06/2010)   Mammogram due: 07/07/2011   Smoking status: never  (06/24/2009)  Diabetes Mellitus   HgbA1C: 6.4  (12/06/2009)   Hemoglobin A1C due: 08/18/2008    Eye exam: done per patient  (06/16/2010)    Foot exam: yes  (06/14/2010)   High risk foot: Not documented   Foot care education: Not documented   Foot exam due: 10/29/2008    Urine microalbumin/creatinine ratio: 138.0  (05/17/2009)   Urine microalbumin/cr due: 10/27/2008    Diabetes flowsheet reviewed?: Yes   Progress toward A1C goal: At goal  Lipids   Total Cholesterol: 172  (08/17/2009)   LDL: 108  (08/17/2009)   LDL Direct: 163.2  (05/17/2009)   HDL: 42.20  (08/17/2009)   Triglycerides: 108.0  (08/17/2009)    SGOT (AST): 19  (07/10/2010)   SGPT (ALT): 24  (07/10/2010)   Alkaline phosphatase: 59  (07/10/2010)   Total bilirubin: 0.3  (07/10/2010)    Lipid flowsheet reviewed?: Yes   Progress toward LDL goal: Unchanged  Hypertension   Last Blood Pressure: 118 / 80  (07/14/2010)   Serum creatinine: 2.81  (07/10/2010)   Serum potassium 3.9  (07/10/2010)    Hypertension flowsheet reviewed?: Yes    Progress toward BP goal: At goal  Self-Management Support :   Personal Goals (by the next clinic visit) :     Personal A1C goal: 7  (07/14/2010)     Personal blood pressure goal: 130/80  (07/14/2010)     Personal LDL goal: 100  (07/14/2010)    Diabetes self-management support: Not documented    Hypertension self-management support: Not documented    Lipid self-management support: Not documented    Nursing Instructions: Schedule screening mammogram (see order) Pap smear today   Appended Document: CPX / LFW please notify patient normal pap, rec rpt in 3 years.  please input into chart with new due date 3 years  Appended Document: CPX / LFW Letter mailed and chart updated

## 2010-11-01 NOTE — Letter (Signed)
Summary: Dr.Vimal Derebail,Nephology,Note  Dr.Vimal Derebail,Nephology,Note   Imported By: Virgia Land 01/12/2010 13:47:43  _____________________________________________________________________  External Attachment:    Type:   Image     Comment:   External Document

## 2010-11-01 NOTE — Miscellaneous (Signed)
  Clinical Lists Changes  Medications: Added new medication of AMLODIPINE BESYLATE 10 MG TABS (AMLODIPINE BESYLATE) Take 1 tablet by mouth once a day

## 2010-11-01 NOTE — Letter (Signed)
Summary: [facility] - consider transplant  [facility]   Imported By: Edmonia James 07/07/2010 13:55:05  _____________________________________________________________________  External Attachment:    Type:   Image     Comment:   External Document

## 2010-11-01 NOTE — Letter (Signed)
Summary: [facility]   Imported By: Laural Benes 08/03/2010 13:01:18  _____________________________________________________________________  External Attachment:    Type:   Image     Comment:   External Document  Appended Document: [facility]    Clinical Lists Changes  Observations: Added new observation of PAST SURG HX: BTL Bladder sling for stress incontinence [MacDIarmid]  Nuclear stress test-nl, EF 69% 4/01 2-D Echo neg 11/01 Bladder U/S Residual 84ml    Cystoscopy Nml   (Dr Matilde Sprang) 12/09/08 Colonoscopy Nml (Dr Olevia Perches ) 02/04/09                rpt 10 yrs. (08/03/2010 14:53) Added new observation of PAST MED HX: CKD stage 4 per [facility] renal (Dr. Alfonse Alpers), considering transplant T2DM (currently diet controlled since losing weight) HLD GERD Hypertension  pre-2003 Urge incontinence (Dr. Matilde Sprang) to do PTNS (08/03/2010 14:53)        Past History:  Past Medical History: CKD stage 4 per [facility] renal (Dr. Alfonse Alpers), considering transplant T2DM (currently diet controlled since losing weight) HLD GERD Hypertension  pre-2003 Urge incontinence (Dr. Matilde Sprang) to do PTNS  Past Surgical History: BTL Bladder sling for stress incontinence [MacDIarmid]  Nuclear stress test-nl, EF 69% 4/01 2-D Echo neg 11/01 Bladder U/S Residual 25ml    Cystoscopy Nml   (Dr Matilde Sprang) 12/09/08 Colonoscopy Nml (Dr Olevia Perches ) 02/04/09                rpt 10 yrs.

## 2010-11-01 NOTE — Assessment & Plan Note (Signed)
Summary: establish/ dr. Council Mechanic patient/alc   Vital Signs:  Patient profile:   60 year old female Height:      62.5 inches Weight:      156.50 pounds BMI:     28.27 Temp:     98.5 degrees F oral Pulse rate:   70 / minute Pulse rhythm:   regular BP sitting:   142 / 78  (left arm) Cuff size:   regular  Vitals Entered By: Maudie Mercury Dance CMA Deborra Medina) (June 14, 2010 3:12 PM) CC: Transfer from Dr. Council Mechanic   History of Present Illness: CC: transfer care, arm pain  60 yo with T2DM, HTN, HLD, stage 4 CKD.  1. arm pain - left arm pain for 2-3 weeks, intermittent.  Sharp, stabbing.  No radiation.  Pain around [facility] fossa.  No paresthesias.  Denies trauma/injury.  2. R heel pain - started 2 wks ago, does walk lots on treadmill, wonders if injured it.  Denies injury/trauma.  states worse at end of work day - is on feet all day.  3. CRI - stays away from NSAIDs.  Sees [facility] nephrologist.  [employer] lab assistant due for CPE.  Preventive Screening-Counseling & Management      Blood Transfusions:  no.    Current Medications (verified): 1)  Nexium 40 Mg  Cpdr (Esomeprazole Magnesium) .... Take One By Mouth Once A Day 2)  Multivitamins   Tabs (Multiple Vitamin) .... Take One By Mouth Once A Day 3)  Vitamin C and Vitamin E 4)  Hyzaar 100-12.5 Mg  Tabs (Losartan Potassium-Hctz) .... Take One By Mouth Every Evening 5)  Ditropan Xl 10 Mg  Tb24 (Oxybutynin Chloride) .... One Tab By Mouth Daily By Mouth 6)  Carvedilol 6.25 Mg Tabs (Carvedilol) .... One Tab By Mouth Two Times A Day 7)  Amaryl 2 Mg Tabs (Glimepiride) .... One Tab By Mouth Every Am 8)  Meclizine Hcl 25 Mg Tabs (Meclizine Hcl) .Marland Kitchen.. 1 Every 8 Hrs As Needed For Dizziness By Mouth 9)  Pravastatin Sodium 80 Mg Tabs (Pravastatin Sodium) .... One Tab By Mouth At Night 10)  Vitamin D3 1000 Unit Caps (Cholecalciferol) .... One Tab By Mouth Two Times A Day 11)  Amlodipine Besylate 10 Mg Tabs (Amlodipine Besylate) .... Take 1 Tablet By Mouth  Once A Day  Allergies (verified): No Known Drug Allergies  Past History:  Past Medical History: Last updated: 05/14/2010 Diabetes mellitus, type II GERD Hypertension  pre-2003 CKD stage 4 per [facility] renal  Social History: No smoking (remote), no EtOH, no rec drugs Occupation:[employer] Single  divorced many years not active sexually, no desire Lives with husband, sister and niece, no pets Daughter in [place] Transfusions:  no  Review of Systems       per HPI  Physical Exam  General:  Well-developed,well-nourished,in no acute distress; alert,appropriate and cooperative throughout examination Head:  Normocephalic and atraumatic without obvious abnormalities. No apparent alopecia or balding. Mouth:  Oral mucosa and oropharynx without lesions or exudates.  Teeth in good repair. Neck:  No deformities, masses, or tenderness noted. Lungs:  Normal respiratory effort, chest expands symmetrically. Lungs are clear to auscultation, no crackles or wheezes. Heart:  Normal rate and regular rhythm. S1 and S2 normal without gallop, murmur, click, rub or other extra sounds. Msk:  L elbow - full flexion/extension.  mild tenderness to deep palpation proximal lateral mm of forearm.  No pain at medial/lateral epicondyle, olecranon.  no deformity, swelling, warmth or erythema. R elbow -  WNL  R heel - no deformity, mild tenderness to deep palpation medial calcaneal heel.  No pain with stretching of plantar fascia.  No pain with palation of achilles tendon. L heel - WNL Pulses:  2+ rad/DP pulses Extremities:  no edema Neurologic:  CN grossly intact, sensation and strength intact, gait and station intact Skin:  Intact without suspicious lesions or rashes  Diabetes Management Exam:    Foot Exam (with socks and/or shoes not present):       Sensory-Pinprick/Light touch:          Left medial foot (L-4): normal          Left dorsal foot (L-5): normal          Left lateral foot (S-1): normal           Right medial foot (L-4): normal          Right dorsal foot (L-5): normal          Right lateral foot (S-1): normal       Sensory-Monofilament:          Left foot: normal          Right foot: normal       Inspection:          Left foot: normal          Right foot: normal       Nails:          Left foot: normal          Right foot: normal   Impression & Recommendations:  Problem # 1:  OTHER SCREENING MAMMOGRAM (ICD-V76.12) set up for mammo as due. Orders: Radiology Referral (Radiology)  Problem # 2:  ELBOW PAIN, LEFT (ICD-719.42) not in right location for lateral epicondylitis.  ? elbow sprain.  Treat as such with tylenol (staying away from NSAIDs 2/2 CRI), icing, stretching exercises and strenghtening exercises provided.  RTC if not improved.  doubt osteochondritis dessicans  Problem # 3:  HEEL PAIN, RIGHT (ICD-729.5) ? plantar fasciitis.  some components of story not typical of such.  treat as such with ice, frozen water bottle massage, RTC if not improved.  Problem # 4:  CHRONIC KIDNEY DISEASE STAGE IV (SEVERE) (ICD-585.4) sees nephrologist from [facility].  stays away from NSAID,s off metformin Labs Reviewed: BUN: 64 (12/06/2009)   Cr: 2.9 (12/06/2009)    Hgb: 11.8 (05/17/2009)   Hct: 35.1 (05/17/2009)   Ca++: 9.4 (12/06/2009)   Phos: 4.6 (12/06/2009) TP: 7.2 (05/17/2009)   Alb: 3.9 (12/06/2009)  Problem # 5:  Preventive Health Care (ICD-V70.0) schedule CPE next visit.  to get flu at work.  Complete Medication List: 1)  Nexium 40 Mg Cpdr (Esomeprazole magnesium) .... Take one by mouth once a day 2)  Multivitamins Tabs (Multiple vitamin) .... Take one by mouth once a day 3)  Vitamin C and Vitamin E  4)  Hyzaar 100-12.5 Mg Tabs (Losartan potassium-hctz) .... Take one by mouth every evening 5)  Ditropan Xl 10 Mg Tb24 (Oxybutynin chloride) .... One tab by mouth daily by mouth 6)  Carvedilol 6.25 Mg Tabs (Carvedilol) .... One tab by mouth two times a day 7)  Amaryl 2 Mg Tabs  (Glimepiride) .... One tab by mouth every am 8)  Meclizine Hcl 25 Mg Tabs (Meclizine hcl) .Marland Kitchen.. 1 every 8 hrs as needed for dizziness by mouth 9)  Pravastatin Sodium 80 Mg Tabs (Pravastatin sodium) .... One tab by mouth at night 10)  Vitamin D3  1000 Unit Caps (Cholecalciferol) .... One tab by mouth two times a day 11)  Amlodipine Besylate 10 Mg Tabs (Amlodipine besylate) .... Take 1 tablet by mouth once a day  Patient Instructions: 1)  return in next 1-2 months for CPE.  come fasting for that appointment if possible for blood work (cholesterol, sugar) 2)  Pass by up front for mammogram scheduling. 3)  Try frozen water bottle for heel to help with inflammation. 4)  Try tylenol for inside elbow pain 5)  Pleasure to meet you today, call clinic with questions  Current Allergies (reviewed today): No known allergies    Prevention & Chronic Care Immunizations   Influenza vaccine: Not documented    Tetanus booster: 10/30/2007: Tdap   Tetanus booster due: 10/29/2017    Pneumococcal vaccine: Not documented  Colorectal Screening   Hemoccult: negative  (12/06/2007)   Hemoccult due: 12/05/2008    Colonoscopy: Location:  [place].    (02/04/2009)   Colonoscopy due: 01/2019  Other Screening   Pap smear: Normal, Satisfactory  (06/24/2009)   Pap smear due: 07/2010    Mammogram: Normal Bilateral  (07/05/2009)   Mammogram due: 07/2010   Smoking status: never  (06/24/2009)  Diabetes Mellitus   HgbA1C: 6.4  (12/06/2009)   Hemoglobin A1C due: 08/18/2008    Eye exam: Not documented    Foot exam: yes  (06/14/2010)   High risk foot: Not documented   Foot care education: Not documented   Foot exam due: 10/29/2008    Urine microalbumin/creatinine ratio: 138.0  (05/17/2009)   Urine microalbumin/cr due: 10/27/2008  Lipids   Total Cholesterol: 172  (08/17/2009)   LDL: 108  (08/17/2009)   LDL Direct: 163.2  (05/17/2009)   HDL: 42.20  (08/17/2009)   Triglycerides: 108.0   (08/17/2009)    SGOT (AST): 18  (08/17/2009)   SGPT (ALT): 18  (08/17/2009)   Alkaline phosphatase: 59  (05/17/2009)   Total bilirubin: 0.9  (05/17/2009)  Hypertension   Last Blood Pressure: 142 / 78  (06/14/2010)   Serum creatinine: 2.9  (12/06/2009)   Serum potassium 3.9  (12/06/2009)  Self-Management Support :    Diabetes self-management support: Not documented    Hypertension self-management support: Not documented    Lipid self-management support: Not documented

## 2010-11-01 NOTE — Letter (Signed)
Summary: Dr.Romulo Colindres,[facility],Note  [place]   Imported By: Virgia Land 12/14/2009 10:40:31  _____________________________________________________________________  External Attachment:    Type:   Image     Comment:   External Document

## 2010-11-01 NOTE — Progress Notes (Signed)
Summary: Rx for Carvedilol and Ditropan 90 day supply  Phone Note Refill Request Call back at Home Phone [phone] Message from:  Patient on October 28, 2009 1:38 PM  Refills Requested: Medication #1:  DITROPAN XL 10 MG  TB24 one tab by mouth DAILY BY MOUTH   Supply Requested: 3 months  Medication #2:  CARVEDILOL 6.25 MG TABS one tab by mouth two times a day   Supply Requested: 3 months Patient request written Rx for a 90 day supply to be sent to mail order pharmacy.  Please call when Rx ready for pick up.   Method Requested: Pick up at Office Initial call taken by: Sherrian Divers CMA Deborra Medina),  October 28, 2009 1:43 PM  Follow-up for Phone Call        Done. Follow-up by: Raenette Rover MD,  October 28, 2009 4:53 PM  Additional Follow-up for Phone Call Additional follow up Details #1::        Patient notified Rx ready for pick up. Rx's also sent in electronically because she will be out of both medications before the mail order comes in. Additional Follow-up by: Sherrian Divers CMA Deborra Medina),  October 28, 2009 5:07 PM    Prescriptions: CARVEDILOL 6.25 MG TABS (CARVEDILOL) one tab by mouth two times a day  #180 x 3   Entered by:   Sherrian Divers CMA ([place])   Authorized by:   Raenette Rover MD   Signed by:   Sherrian Divers CMA ([place]) on 10/28/2009   Method used:   Electronically to        [facility]. [phone]* (retail)       [place], [place]  [address]       Ph: [phone] or [phone]       Fax: [phone]   RxID:   [id number] DITROPAN XL 10 MG  TB24 (OXYBUTYNIN CHLORIDE) one tab by mouth DAILY BY MOUTH  #90 x 3   Entered by:   Sherrian Divers CMA ([place])   Authorized by:   Raenette Rover MD   Signed by:   Sherrian Divers CMA ([place]) on 10/28/2009   Method used:   Electronically to        [facility]. [phone]* (retail)       [place], [place]  [address]       Ph: [phone] or  [phone]       Fax: [phone]   RxID:   [id number] CARVEDILOL 6.25 MG TABS (CARVEDILOL) one tab by mouth two times a day  #180 x 3   Entered and Authorized by:   Raenette Rover MD   Signed by:   Raenette Rover MD on 10/28/2009   Method used:   Print then Give to Patient   RxID:   [id number] DITROPAN XL 10 MG  TB24 (OXYBUTYNIN CHLORIDE) one tab by mouth DAILY BY MOUTH  #90 x 3   Entered and Authorized by:   Raenette Rover MD   Signed by:   Raenette Rover MD on 10/28/2009   Method used:   Print then Give to Patient   RxID:   [id number]

## 2010-11-01 NOTE — Letter (Signed)
Summary: Dr.Scott MacDiarmid,[facility],Note  [place] MacDiarmid,[facility],Note   Imported By: Virgia Land 02/24/2010 16:45:34  _____________________________________________________________________  External Attachment:    Type:   Image     Comment:   External Document

## 2010-11-01 NOTE — Letter (Signed)
Summary: Dr.Vimal Del[facility],Note  Dr.Vimal [facility],Note   Imported By: Virgia Land 12/14/2009 13:37:49  _____________________________________________________________________  External Attachment:    Type:   Image     Comment:   External Document

## 2010-11-01 NOTE — Assessment & Plan Note (Signed)
Summary: ER FOLLOW UP/ [facility]/ 12:00   Vital Signs:  Patient profile:   60 year old female Weight:      157.50 pounds Temp:     98.8 degrees F oral Pulse rate:   74 / minute Pulse rhythm:   regular BP sitting:   132 / 82  (left arm) Cuff size:   regular  Vitals Entered By: Maudie Mercury Dance CMA Deborra Medina) (July 11, 2010 12:15 PM) CC: ER follow up   History of Present Illness: CC: ER f/u  presents with husband.  Seen over weekend at [facility] for syncope at church, cbg found to be 37.  given amp glucose, went up 200s.  head CT WNL, CXR WNL, blood work WNL (Cr 2.8), EKG with LVH and nonspecific ST T changes.  recommended f/u with PCP so here today.  Happened while eating a piece of chicken.  Had good breakfast.  Over last 5 months husband has noticed patient more tired.  Recently had steroid shot in heel.  No hA, CP/tightness, SOB.  possibly a bit dehydrate.    As far as DM, only on glimepiride 2mg  daily.  Over last few weeks, has had ok sugar checks but doesn't routinely check.  down 10 lbs since 11/2009 (trying).    Allergies: No Known Drug Allergies  Past History:  Past Medical History: Diabetes mellitus, type II GERD Hypertension  pre-2003 CKD stage 4 per [facility] renal, considering transplant PMH-FH-SH reviewed for relevance  Social History: No smoking (remote), no EtOH, no rec drugs Occupation: [employer], Corporate investment banker Single  divorced many years not active sexually, no desire Lives with husband, sister and niece, no pets Daughter in [place]       per HPI  Physical Exam  General:  Well-developed,well-nourished,in no acute distress; alert,appropriate and cooperative throughout examination   Impression & Recommendations:  Problem # 1:  DIABETES MELLITUS, TYPE II (ICD-250.00) pt has been on amaryl for 1 1/2 years for DM, has had 10 lb weight loss in last 6 months.  likely overtreating at this time.  last A1c 11/2009 6.4%.  recheck today, expect it to be lower,  stop Amaryl.    The following medications were removed from the medication list:    Amaryl 2 Mg Tabs (Glimepiride) ..... One tab by mouth every am Her updated medication list for this problem includes:    Hyzaar 100-12.5 Mg Tabs (Losartan potassium-hctz) .Marland Kitchen... Take one by mouth every evening  Labs Reviewed: Creat: 2.9 (12/06/2009)   Microalbumin: 105.5 (07/02/2005) Reviewed HgBA1c results: 6.4 (12/06/2009)  6.1 (05/17/2009)  Orders: TLB-A1C / Hgb A1C (Glycohemoglobin) (83036-A1C)  Problem # 2:  SYNCOPE (ICD-780.2) associated with HA and dizzines.  w/u included: head CT WNL at [facility] ER, CXR WNL, blood work including cardiac enzymes x 1 WNL.  likely from hypoglycemia.  see above for plan.  Problem # 3:  CHRONIC KIDNEY DISEASE STAGE IV (SEVERE) (ICD-585.4) received records from Dr. Alfonse Alpers.  noted discussing transplant.  pt to f/u with her in 2 mo.  Cr at [facility] 2.8 (10/9).  Labs Reviewed: BUN: 64 (12/06/2009)   Cr: 2.9 (12/06/2009)    Hgb: 11.8 (05/17/2009)   Hct: 35.1 (05/17/2009)   Ca++: 9.4 (12/06/2009)   Phos: 4.6 (12/06/2009) TP: 7.2 (05/17/2009)   Alb: 3.9 (12/06/2009)  Complete Medication List: 1)  Nexium 40 Mg Cpdr (Esomeprazole magnesium) .... Take one by mouth once a day 2)  Multivitamins Tabs (Multiple vitamin) .... Take one by mouth once a day  3)  Vitamin C and Vitamin E  4)  Hyzaar 100-12.5 Mg Tabs (Losartan potassium-hctz) .... Take one by mouth every evening 5)  Ditropan Xl 10 Mg Tb24 (Oxybutynin chloride) .... One tab by mouth daily by mouth 6)  Carvedilol 6.25 Mg Tabs (Carvedilol) .... One tab by mouth two times a day 7)  Meclizine Hcl 25 Mg Tabs (Meclizine hcl) .Marland Kitchen.. 1 every 8 hrs as needed for dizziness by mouth 8)  Pravastatin Sodium 80 Mg Tabs (Pravastatin sodium) .... One tab by mouth at night 9)  Vitamin D3 1000 Unit Caps (Cholecalciferol) .... One tab by mouth two times a day 10)  Amlodipine Besylate 10 Mg Tabs (Amlodipine besylate) .... Take 1 tablet by mouth once  a day  Patient Instructions: 1)  Stop Amaryl 2mg  daily.  A1c checked today.  Please keep appointment with me on Thursday for CPE. 2)  Good to see you today.  Call clinic with questions.  Current Allergies (reviewed today): No known allergies

## 2010-11-01 NOTE — Miscellaneous (Signed)
  Clinical Lists Changes  Observations: Added new observation of PAP DUE: 07/2013 (07/22/2010 8:39) Added new observation of PAP SMEAR: normal (07/14/2010 8:39)      Preventive Care Screening  Pap Smear:    Date:  07/14/2010    Next Due:  07/2013    Results:  normal

## 2010-11-01 NOTE — Letter (Signed)
Summary: Anabel Halon letter  [place] at [facility]  [address] [place], [place] [address]   Phone: [phone]  Fax: [phone]       05/09/2010 MRN: [id number]  KAYTLINN OPSAHL [address] [place], [place]  [address]  Dear Ms. Roslynn Amble [place], and [place] announce the retirement of Modesto Charon, M.D., from full-time practice at the [facility] office effective March 31, 2010 and his plans of returning part-time.  It is important to Dr. Council Mechanic and to our practice that you understand that [facility] has seven physicians in our office for your health care needs.  We will continue to offer the same exceptional care that you have today.    Dr. Council Mechanic has spoken to many of you about his plans for retirement and returning part-time in the fall.   We will continue to work with you through the transition to schedule appointments for you in the office and meet the high standards that [facility] is committed to.   Again, it is with great pleasure that we share the news that Dr. Council Mechanic will return to [place] at [facility] in October of 2011 with a reduced schedule.    If you have any questions, or would like to request an appointment with one of our physicians, please call us at [phone] and press the option for Scheduling an appointment.  We take pleasure in providing you with excellent patient care and look forward to seeing you at your next office visit.  [facility] are:  Viviana Simpler, M.D. Teresa Pelton, M.D. Loura Pardon, M.D. Eliezer Lofts, M.D. Owens Loffler, M.D. Arnette Norris, M.D. We proudly welcomed Renford Dills, M.D. and Ria Bush, M.D. to the practice in July/August 2011.  Sincerely,  [facility]

## 2010-11-01 NOTE — Letter (Signed)
Summary: Results Follow up Letter  [place] at [facility]  [address] [place], [place] [address]   Phone: [phone]  Fax: [phone]    07/22/2010 MRN: [id number]  ANNALIS SIEKIERSKI [address] [place], [place]  [address]  Dear Ms. Shimel,  The following are the results of your recent test(s):  Test         Result    Pap Smear:        Normal __X___  Not Normal _____ Comments:Repeat in 3 years ______________________________________________________ Cholesterol: LDL(Bad cholesterol):         Your goal is less than:         HDL (Good cholesterol):       Your goal is more than: Comments:  ______________________________________________________ Mammogram:        Normal _____  Not Normal _____ Comments:  ___________________________________________________________________ Hemoccult:        Normal _____  Not normal _______ Comments:    _____________________________________________________________________ Other Tests:    We routinely do not discuss normal results over the telephone.  If you desire a copy of the results, or you have any questions about this information we can discuss them at your next office visit.   Sincerely,     Ria Bush, MD

## 2010-11-01 NOTE — Assessment & Plan Note (Signed)
Summary: 4MTH F/U AFTER LABS / LFW   Vital Signs:  Patient profile:   60 year old female Weight:      166.25 pounds Temp:     98.4 degrees F oral Pulse rate:   76 / minute Pulse rhythm:   regular BP sitting:   100 / 60  (left arm) Cuff size:   large  Vitals Entered By: Emelia Salisbury LPN (March 10, [id number] 075-GRM AM) CC: Follow-up after labs   History of Present Illness: [name] is a 60 y/o African [place] female who presents today for a follow up on kidney function and glucose labs which were elevated 1.5 months ago.  She is doing well and plans to return to work next week after being out because of  bladder surgery.  Her nephrologist would like to add 5mg  amlodipine to her current 5mg  dose.  She also complains of some tenderness of the nose to touch.  Otherwise she has no new medical complaints and is tolerating her medications well. She is no longer having encoparesis after stopping her eye drops.  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 Take One By Mouth Once A Day 2)  Multivitamins   Tabs  (Multiple Vitamin) .... Take One By Mouth Once A Day 3)  Vitamin C and Vitamin E 4)  Hyzaar 100-12.5 Mg  Tabs (Losartan Potassium-Hctz) .... Take One By Mouth Every Evening 5)  Norvasc 5 Mg  Tabs (Amlodipine Besylate) .... Take One By Mouth Every Evening 6)  Ditropan Xl 10 Mg  Tb24 (Oxybutynin Chloride) .... One Tab By Mouth Daily By Mouth 7)  Carvedilol 6.25 Mg Tabs (Carvedilol) .... One Tab By Mouth Two Times A Day 8)  Amaryl 2 Mg Tabs (Glimepiride) .... One Tab By Mouth Every Am 9)  Meclizine Hcl 25 Mg Tabs (Meclizine Hcl) .Marland Kitchen.. 1 Every 8 Hrs As Needed For Dizziness By Mouth 10)  Pravastatin Sodium 80 Mg Tabs (Pravastatin Sodium) .... One Tab By Mouth At Night 11)  Vitamin D (Ergocalciferol) 50000 Unit Caps (Ergocalciferol) .... One Tab By Mouth Once A Week.  Allergies: No Known Drug Allergies  Physical Exam  General:  Well-developed,well-nourished,in no acute distress; alert,appropriate and cooperative throughout examination Head:  Normocephalic and atraumatic without obvious abnormalities. No apparent alopecia or balding. Eyes:  Conjunctiva clear bilat. Ears:  External ear exam shows no significant lesions or deformities.  Otoscopic examination reveals clear canals, tympanic membranes are intact bilaterally without bulging, retraction, inflammation or discharge. Hearing is grossly normal bilaterally. Nose:  External nasal examination shows no deformity or inflammation. Nasal mucosa are pink and moist without lesions or exudates but  anterior mucosa is mildly irritated, no actual bleeding noted. Mouth:  Oral mucosa and oropharynx without lesions or exudates.  Teeth in good repair. Lungs:  Normal respiratory effort, chest expands symmetrically. Lungs are clear to auscultation, no crackles or wheezes. Heart:  Normal rate and regular rhythm. S1 and S2 normal without gallop, murmur, click, rub or other extra sounds.   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  Problem # 2:  UNSPECIFIED VITAMIN D DEFICIENCY (ICD-268.9) Assessment: Improved Change from 50000Iu weekly to Vit D 1000Iu two times a day  Recheck next Fall.  Problem # 3:  DIABETES MELLITUS, TYPE II (ICD-250.00) Assessment: Unchanged Good control. Cont curr meds and lifestyle. Her updated medication list for this problem includes:    Hyzaar 100-12.5 Mg Tabs (Losartan potassium-hctz) .Marland Kitchen... Take one by mouth every evening    Amaryl 2 Mg Tabs (Glimepiride) ..... 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 Will need to establish with nephrologist soon. Cont good BP and DM control.  Complete Medication List: 1)  Nexium 40 Mg Cpdr (Esomeprazole magnesium) .... Take one by mouth once a day 2)  Multivitamins Tabs (Multiple vitamin) .... Take one by mouth once a day 3)  Vitamin C and Vitamin E  4)  Hyzaar 100-12.5 Mg Tabs (Losartan potassium-hctz) .... Take one by mouth every evening 5)  Norvasc 5 Mg Tabs (Amlodipine besylate) .... Take one by mouth every evening 6)  Ditropan Xl 10 Mg Tb24 (Oxybutynin chloride) .... One tab by mouth daily by mouth 7)  Carvedilol 6.25 Mg Tabs (Carvedilol) .... One tab by mouth two times a day 8)  Amaryl 2 Mg Tabs (Glimepiride) .... One tab by mouth every am 9)  Meclizine Hcl 25 Mg Tabs (Meclizine hcl) .Marland Kitchen.. 1 every 8 hrs as needed for dizziness by mouth 10)  Pravastatin Sodium 80 Mg Tabs (Pravastatin sodium) .... One tab by mouth at night 11)  Vitamin D3 1000 Unit Caps (Cholecalciferol) .... One tab by mouth two times a day  Patient Instructions: 1)  RTC in the Fall for Comp Exam, labs prior.  Current Allergies (reviewed today): No known allergies   Appended Document: 4MTH F/U AFTER LABS  / LFW Check CBC and BUN/Cr , PTH and GFR in future per Nephrology to check transplant status. Should be done by Nephrology about Jul.  Appended Document: 4MTH F/U AFTER LABS / LFW Get Urine protein to Creatinine ratio and urine Microalbumin to Creatinine ratio when able...send to Dr Christa See, [facility].

## 2010-11-01 NOTE — Letter (Signed)
Summary: Out of Work  [employer] [facility]  [address] [place], [place] [address]   Phone: [phone]  Fax: [phone]    July 11, 2010   Employee:  CELISE CLIATT [facility]    To Whom It May Concern:   For Medical reasons, please excuse the above named employee from work for the following dates:  Start:  July 11, 2010   End:  July 12, 2010  (may return to work tomorrow)  If you need additional information, please feel free to contact our office.         Sincerely,    Ria Bush  MD

## 2010-11-01 NOTE — Progress Notes (Signed)
Summary: Amiodipine 10mg  Rx.Marland Kitchen  Phone Note Call from Patient   Summary of Call: Pt called request a 30 day supply of Amiodipine 10 mg. Pt will normally get her medication thru mail order, however she is out. [facility]Pt call back # [phone].Marland KitchenAllen Norris  April 18, 2010 10:20 AM  Initial call taken by: Allen Norris,  April 18, 2010 10:20 AM  Follow-up for Phone Call        Left message on voicemail  to return call.   10 mg. Amlodipine was requested.  Our meds list says 5 mg.  Need to clarify with patient.  Tried to phone back at contact number that was left and without an extension, operator could not locate the patient.  Lugene Fuquay CMA Deborra Medina)  April 18, 2010 2:33 PM   [facility].  Lugene Fuquay CMA Deborra Medina)  April 18, 2010 4:54 PM

## 2010-11-01 NOTE — Miscellaneous (Signed)
  Clinical Lists Changes  Observations: Added new observation of PLATELETK/UL: 214 K/uL (07/10/2010 16:03) Added new observation of MCV: 83 fL (07/10/2010 16:03) Added new observation of HCT: 35.5 % (07/10/2010 16:03) Added new observation of HGB: 11.7 g/dL (07/10/2010 16:03) Added new observation of WBC COUNT: 9.9 10*3/microliter (07/10/2010 16:03) Added new observation of CALCIUM: 9.5 mg/dL (07/10/2010 16:03) Added new observation of ALBUMIN: 3.9 g/dL (07/10/2010 16:03) Added new observation of PROTEIN, TOT: 7.9 g/dL (07/10/2010 16:03) Added new observation of SGPT (ALT): 24 units/L (07/10/2010 16:03) Added new observation of SGOT (AST): 19 units/L (07/10/2010 16:03) Added new observation of ALK PHOS: 59 units/L (07/10/2010 16:03) Added new observation of BILI TOTAL: 0.3 mg/dL (07/10/2010 16:03) Added new observation of CREATININE: 2.81 mg/dL (07/10/2010 16:03) Added new observation of BUN: 60 mg/dL (07/10/2010 16:03) Added new observation of BG RANDOM: 75 mg/dL (07/10/2010 16:03) Added new observation of CO2 PLSM/SER: 20 meq/L (07/10/2010 16:03) Added new observation of CL SERUM: 110 meq/L (07/10/2010 16:03) Added new observation of K SERUM: 3.9 meq/L (07/10/2010 16:03) Added new observation of NA: 142 meq/L (07/10/2010 16:03)

## 2010-11-01 NOTE — Letter (Signed)
Summary: [facility]- renal looking good  [place] By: Phillis Knack 09/06/2010 11:49:01  _____________________________________________________________________  External Attachment:    Type:   Image     Comment:   External Document

## 2010-11-01 NOTE — Letter (Signed)
Summary: [facility]   Imported By: Edmonia James 05/13/2010 08:46:08  _____________________________________________________________________  External Attachment:    Type:   Image     Comment:   External Document  Appended Document: [facility]    Clinical Lists Changes  Observations: Added new observation of PAST MED HX: Diabetes mellitus, type II GERD Hypertension  pre-2003 CKD stage 4 per [facility] renal (05/14/2010 16:56)       Past History:  Past Medical History: Diabetes mellitus, type II GERD Hypertension  pre-2003 CKD stage 4 per [facility] renal

## 2010-11-01 NOTE — Letter (Signed)
Summary: Dr.Scott MacDiarmid,[facility],Note  Dr.Scott MacDiarmid,[facility],Note   Imported By: Virgia Land 10/06/2009 15:34:37  _____________________________________________________________________  External Attachment:    Type:   Image     Comment:   External Document

## 2010-11-01 NOTE — Letter (Signed)
Summary: [facility]   Imported By: Edmonia James 11/26/2009 10:27:51  _____________________________________________________________________  External Attachment:    Type:   Image     Comment:   External Document

## 2010-11-03 NOTE — Assessment & Plan Note (Signed)
Summary: 3-4 MONTH FOLLOW UP/RBH   Vital Signs:  Patient profile:   60 year old female Height:      63 inches Weight:      157.50 pounds BMI:     28.00 Temp:     98.6 degrees F oral Pulse rate:   60 / minute Pulse rhythm:   regular BP sitting:   126 / 76  (left arm) Cuff size:   regular  Vitals Entered By: Ozzie Hoyle LPN (January 23, [id number] 12:35 PM) CC: 3-4 month f/u   History of Present Illness: CC: DM f/u  1. encopresis - 3-73mo, when goes to use bathroom, having more problems with encopresis and inability to control.  thinks that hard stool has caused her to stretch muscles and now can't hold in.  No pain.  No bleeding.  No leg weakness or numbness, no fevers/chills.  seems to happen more at work.  did try some miralax on weekends.  having small amt liquid stool, not entire BM.  has had 1 child vaginally, 5lb 9oz.  bms not regular - has every few days 1 bm, some constipation.   getting PTNS for urge incontinence at [facility] (x 2 mo now).  2. DM - doing well.  checks sugars.  off all meds since glimepiride caused lows.  A1c remains controlled at 6.6% with diet.  3. HTN - stable on meds, compliant  Current Medications (verified): 1)  Amlodipine Besylate 10 Mg Tabs (Amlodipine Besylate) .... Take 1 Tablet By Mouth Once A Day 2)  Carvedilol 6.25 Mg Tabs (Carvedilol) .... One Tab By Mouth Two Times A Day 3)  Hyzaar 100-12.5 Mg  Tabs (Losartan Potassium-Hctz) .... Take One By Mouth Every Evening 4)  Pravastatin Sodium 80 Mg Tabs (Pravastatin Sodium) .... One Tab By Mouth At Night 5)  Nexium 40 Mg  Cpdr (Esomeprazole Magnesium) .... Take One By Mouth Once A Day 6)  Multivitamins   Tabs (Multiple Vitamin) .... Take One By Mouth Once A Day 7)  Vitamin C and Vitamin E 8)  Ditropan Xl 10 Mg  Tb24 (Oxybutynin Chloride) .... One Tab By Mouth Daily By Mouth 9)  Vitamin D3 1000 Unit Caps (Cholecalciferol) .... One Tab By Mouth Two Times A Day 10)  Meclizine Hcl 25 Mg Tabs (Meclizine Hcl)  .Marland Kitchen.. 1 Every 8 Hrs As Needed For Dizziness By Mouth 11)  Miralax  Powd (Polyethylene Glycol 3350) .Marland Kitchen.. 17gm in 8oz Fluid Daily For Constipation  Allergies (verified): No Known Drug Allergies  Past History:  Past Medical History: Last updated: 08/03/2010 CKD stage 4 per [facility] renal (Dr. Alfonse Alpers), considering transplant T2DM (currently diet controlled since losing weight) HLD GERD Hypertension  pre-2003 Urge incontinence (Dr. Matilde Sprang) to do PTNS  Past Surgical History: Last updated: 08/03/2010 BTL Bladder sling for stress incontinence [MacDIarmid]  Nuclear stress test-nl, EF 69% 4/01 2-D Echo neg 11/01 Bladder U/S Residual 34ml    Cystoscopy Nml   (Dr Matilde Sprang) 12/09/08 Colonoscopy Nml (Dr Olevia Perches ) 02/04/09                rpt 10 yrs.  Social History: Last updated: 07/11/2010 No smoking (remote), no EtOH, no rec drugs Occupation: [employer], Corporate investment banker Single  divorced many years not active sexually, no desire Lives with husband, sister and niece, no pets Daughter in [place]       per HPI  Physical Exam  General:  Well-developed,well-nourished,in no acute distress; alert,appropriate and cooperative throughout examination Head:  Normocephalic and atraumatic without obvious abnormalities. No apparent alopecia or balding. Mouth:  Oral mucosa and oropharynx without lesions or exudates.  Teeth in good repair. Neck:  No deformities, masses, or tenderness noted. Lungs:  Normal respiratory effort, chest expands symmetrically. Lungs are clear to auscultation, no crackles or wheezes. Heart:  Normal rate and regular rhythm. S1 and S2 normal without gallop, murmur, click, rub or other extra sounds. Abdomen:  Bowel sounds positive,abdomen soft and non-tender without masses, organomegaly or hernias noted. Rectal:  No external abnormalities noted.  decreased sphincter tone. No rectal masses or tenderness. Pulses:  2+ rad/DP pulses Extremities:  no  edema  Diabetes Management Exam:    Foot Exam (with socks and/or shoes not present):       Sensory-Pinprick/Light touch:          Left medial foot (L-4): normal          Left dorsal foot (L-5): normal          Left lateral foot (S-1): normal          Right medial foot (L-4): normal          Right dorsal foot (L-5): normal          Right lateral foot (S-1): normal       Sensory-Monofilament:          Left foot: normal          Right foot: normal       Inspection:          Left foot: normal          Right foot: normal       Nails:          Left foot: normal          Right foot: normal   Impression & Recommendations:  Problem # 1:  ENCOPRESIS (ICD-307.7) h/o constipation, ? if impaction causing, trial of miralax regularly with soluble fiber supplements daily for 2 wks. to call us if not better for referral to GI for eval encopresis and anorectal manometry.  h/o DM but well controlled.  on nexium but doesn't seem to have significant diarrhea component.  only had one vag delivery of 5lb baby.  encopresis has been issue in past.  colonsocopy WNL 2010 Dr. Olevia Perches.  check KUB to eval for impaction/stool load.  no rad tech available so pt will return.  Orders: T-1 View Abdomen (KUB) (74000TC)  Problem # 2:  DIABETES MELLITUS, TYPE II (ICD-250.00) Assessment: Improved off all antihyperglycemic meds 2/2 lows.  A1c stable at 6.6% diet controlled.  continue.  RTC 6 mo for f/u.  Her updated medication list for this problem includes:    Hyzaar 100-12.5 Mg Tabs (Losartan potassium-hctz) .Marland Kitchen... Take one by mouth every evening  Labs Reviewed: Creat: 2.9 (10/17/2010)   Microalbumin: 105.5 (07/02/2005)  Last Eye Exam: done per patient (06/16/2010) Reviewed HgBA1c results: 6.6 (10/17/2010)  6.3 (07/11/2010)  Problem # 3:  HYPERTENSION (ICD-401.9) Assessment: Unchanged  Her updated medication list for this problem includes:    Amlodipine Besylate 10 Mg Tabs (Amlodipine besylate) .Marland Kitchen... Take 1  tablet by mouth once a day    Carvedilol 6.25 Mg Tabs (Carvedilol) ..... One tab by mouth two times a day    Hyzaar 100-12.5 Mg Tabs (Losartan potassium-hctz) .Marland Kitchen... Take one by mouth every evening  BP today: 126/76 Prior BP: 118/80 (07/14/2010)  Labs Reviewed: K+: 4.4 (10/17/2010) Creat: : 2.9 (10/17/2010)   Chol: 169 (10/17/2010)  HDL: 46.80 (10/17/2010)   LDL: 112 (10/17/2010)   TG: 51.0 (10/17/2010)  Problem # 4:  CHRONIC KIDNEY DISEASE STAGE IV (SEVERE) (ICD-585.4) noted discussing transplant w/ Dr. Alfonse Alpers.  pt to f/u with her.  Cr at [facility] 2.8 (10/9).  Labs Reviewed: BUN: 56 (10/17/2010)   Cr: 2.9 (10/17/2010)    Hgb: 11.7 (07/10/2010)   Hct: 35.5 (07/10/2010)   Ca++: 9.5 (10/17/2010)   Phos: 4.6 (12/06/2009) TP: 7.0 (10/17/2010)   Alb: 4.0 (10/17/2010)  Complete Medication List: 1)  Amlodipine Besylate 10 Mg Tabs (Amlodipine besylate) .... Take 1 tablet by mouth once a day 2)  Carvedilol 6.25 Mg Tabs (Carvedilol) .... One tab by mouth two times a day 3)  Hyzaar 100-12.5 Mg Tabs (Losartan potassium-hctz) .... Take one by mouth every evening 4)  Pravastatin Sodium 80 Mg Tabs (Pravastatin sodium) .... One tab by mouth at night 5)  Nexium 40 Mg Cpdr (Esomeprazole magnesium) .... Take one by mouth once a day 6)  Multivitamins Tabs (Multiple vitamin) .... Take one by mouth once a day 7)  Vitamin C and Vitamin E  8)  Vitamin D3 1000 Unit Caps (Cholecalciferol) .... One tab by mouth two times a day 9)  Ditropan Xl 10 Mg Tb24 (Oxybutynin chloride) .... One tab by mouth daily by mouth 10)  Meclizine Hcl 25 Mg Tabs (Meclizine hcl) .Marland Kitchen.. 1 every 8 hrs as needed for dizziness by mouth 11)  Miralax Powd (Polyethylene glycol 3350) .Marland Kitchen.. 17gm in 8oz fluid daily for constipation  Patient Instructions: 1)  return in 6 months for follow up. 2)  Sugar is doing well. 3)  take miralax daily for next 2 weeks as well as 1 cup with fiber supplement daily (metamucil or benefiber). 4)  Call me in 2 weeks  w/update. 5)  Good to see you today.   Orders Added: 1)  T-1 View Abdomen (KUB) [74000TC] 2)  Est. Patient Level IV [id number]    Current Allergies (reviewed today): No known allergies

## 2010-11-03 NOTE — Progress Notes (Signed)
----   Converted from flag ---- ---- 10/10/2010 6:34 PM, Ria Bush  MD wrote: CMP, FLP, A1c 250.00, 272.4, 401.9  ---- 10/10/2010 11:50 AM, Daralene Milch CMA (AAMA) wrote: Lab orders please! Good Morning! This pt is scheduled for labs Monday, which labs to draw and dx codes to use? Thanks Tasha ------------------------------

## 2010-11-14 ENCOUNTER — Encounter: Payer: Self-pay | Admitting: Family Medicine

## 2010-12-13 NOTE — Medication Information (Signed)
[facility]   Imported By: Jamelle Haring 12/05/2010 11:42:56  _____________________________________________________________________  External Attachment:    Type:   Image     Comment:   External Document

## 2010-12-19 ENCOUNTER — Encounter: Payer: Self-pay | Admitting: Family Medicine

## 2010-12-19 DIAGNOSIS — E1121 Type 2 diabetes mellitus with diabetic nephropathy: Secondary | ICD-10-CM | POA: Insufficient documentation

## 2010-12-19 DIAGNOSIS — E119 Type 2 diabetes mellitus without complications: Secondary | ICD-10-CM

## 2010-12-19 DIAGNOSIS — E1169 Type 2 diabetes mellitus with other specified complication: Secondary | ICD-10-CM | POA: Insufficient documentation

## 2010-12-19 DIAGNOSIS — N189 Chronic kidney disease, unspecified: Secondary | ICD-10-CM

## 2010-12-19 DIAGNOSIS — Z992 Dependence on renal dialysis: Secondary | ICD-10-CM | POA: Insufficient documentation

## 2010-12-19 DIAGNOSIS — I10 Essential (primary) hypertension: Secondary | ICD-10-CM

## 2010-12-19 DIAGNOSIS — N3946 Mixed incontinence: Secondary | ICD-10-CM | POA: Insufficient documentation

## 2010-12-19 DIAGNOSIS — N186 End stage renal disease: Secondary | ICD-10-CM | POA: Insufficient documentation

## 2010-12-19 DIAGNOSIS — E785 Hyperlipidemia, unspecified: Secondary | ICD-10-CM

## 2010-12-19 DIAGNOSIS — N3941 Urge incontinence: Secondary | ICD-10-CM

## 2010-12-19 DIAGNOSIS — K219 Gastro-esophageal reflux disease without esophagitis: Secondary | ICD-10-CM

## 2010-12-21 LAB — ABO/RH: ABO/RH(D): AB POS

## 2010-12-21 LAB — CBC
Hemoglobin: 11.9 g/dL — ABNORMAL LOW (ref 12.0–15.0)
MCHC: 32.7 g/dL (ref 30.0–36.0)
MCV: 82.6 fL (ref 78.0–100.0)
RBC: 4.41 MIL/uL (ref 3.87–5.11)
WBC: 5.6 10*3/uL (ref 4.0–10.5)

## 2010-12-21 LAB — TYPE AND SCREEN: Antibody Screen: NEGATIVE

## 2010-12-21 LAB — BASIC METABOLIC PANEL
CO2: 25 mEq/L (ref 19–32)
Calcium: 9.3 mg/dL (ref 8.4–10.5)
Chloride: 108 mEq/L (ref 96–112)
Creatinine, Ser: 2.94 mg/dL — ABNORMAL HIGH (ref 0.4–1.2)
Glucose, Bld: 90 mg/dL (ref 70–99)

## 2010-12-21 LAB — APTT: aPTT: 25 seconds (ref 24–37)

## 2010-12-21 LAB — PROTIME-INR: INR: 1.02 (ref 0.00–1.49)

## 2011-01-10 LAB — GLUCOSE, CAPILLARY: Glucose-Capillary: 86 mg/dL (ref 70–99)

## 2011-03-20 ENCOUNTER — Other Ambulatory Visit: Payer: Self-pay | Admitting: *Deleted

## 2011-03-20 MED ORDER — MECLIZINE HCL 25 MG PO TABS
25.0000 mg | ORAL_TABLET | Freq: Three times a day (TID) | ORAL | Status: DC | PRN
Start: 2011-03-20 — End: 2012-08-22

## 2011-03-20 NOTE — Telephone Encounter (Signed)
refilled 

## 2011-03-27 ENCOUNTER — Other Ambulatory Visit: Payer: Self-pay | Admitting: *Deleted

## 2011-03-27 MED ORDER — LOSARTAN POTASSIUM-HCTZ 100-12.5 MG PO TABS: 1.0000 | ORAL_TABLET | Freq: Every evening | ORAL | Status: DC

## 2011-03-27 NOTE — Telephone Encounter (Signed)
Rx sent to local pharmacy to last until paient receives from mail order.

## 2011-04-10 ENCOUNTER — Ambulatory Visit: Payer: Self-pay | Admitting: Family Medicine

## 2011-04-17 ENCOUNTER — Encounter: Payer: Self-pay | Admitting: Family Medicine

## 2011-04-17 ENCOUNTER — Ambulatory Visit (INDEPENDENT_AMBULATORY_CARE_PROVIDER_SITE_OTHER): Payer: 59 | Admitting: Family Medicine

## 2011-04-17 ENCOUNTER — Encounter: Payer: Self-pay | Admitting: Internal Medicine

## 2011-04-17 VITALS — BP 124/76 | HR 64 | Temp 98.7°F | Ht 62.0 in | Wt 153.0 lb

## 2011-04-17 DIAGNOSIS — N941 Unspecified dyspareunia: Secondary | ICD-10-CM

## 2011-04-17 DIAGNOSIS — E119 Type 2 diabetes mellitus without complications: Secondary | ICD-10-CM

## 2011-04-17 DIAGNOSIS — R252 Cramp and spasm: Secondary | ICD-10-CM

## 2011-04-17 DIAGNOSIS — N184 Chronic kidney disease, stage 4 (severe): Secondary | ICD-10-CM

## 2011-04-17 DIAGNOSIS — E785 Hyperlipidemia, unspecified: Secondary | ICD-10-CM

## 2011-04-17 DIAGNOSIS — R159 Full incontinence of feces: Secondary | ICD-10-CM

## 2011-04-17 DIAGNOSIS — N952 Postmenopausal atrophic vaginitis: Secondary | ICD-10-CM | POA: Insufficient documentation

## 2011-04-17 DIAGNOSIS — N189 Chronic kidney disease, unspecified: Secondary | ICD-10-CM

## 2011-04-17 DIAGNOSIS — IMO0002 Reserved for concepts with insufficient information to code with codable children: Secondary | ICD-10-CM

## 2011-04-17 DIAGNOSIS — I10 Essential (primary) hypertension: Secondary | ICD-10-CM

## 2011-04-17 LAB — RENAL FUNCTION PANEL
Albumin: 4.3 g/dL (ref 3.5–5.2)
CO2: 27 mEq/L (ref 19–32)
Calcium: 9.3 mg/dL (ref 8.4–10.5)
Glucose, Bld: 88 mg/dL (ref 70–99)
Potassium: 4.3 mEq/L (ref 3.5–5.1)
Sodium: 137 mEq/L (ref 135–145)

## 2011-04-17 MED ORDER — GLUCOSE BLOOD VI STRP: ORAL_STRIP | Status: DC

## 2011-04-17 MED ORDER — FREESTYLE SYSTEM KIT: 1.0000 | PACK | Status: DC | PRN

## 2011-04-17 MED ORDER — ONETOUCH ULTRA SYSTEM W/DEVICE KIT: 1.0000 | PACK | Freq: Once | Status: DC

## 2011-04-17 NOTE — Assessment & Plan Note (Signed)
Last A1c 6.6%.  Well controlled. Foot exam today.

## 2011-04-17 NOTE — Assessment & Plan Note (Signed)
Doubt due to statin. Check K.

## 2011-04-17 NOTE — Patient Instructions (Signed)
Return after October 13th for your next physical. Trial of vaginal lubricant (replens or KY jelly), if not helping, return sooner for further evaluation. Pass by [facility]' office for referral to GI. Blood work today to check diabetes and potassium. Good to see you today, call us with questions.

## 2011-04-17 NOTE — Assessment & Plan Note (Signed)
Main complaint seems to be vaginal dryness. Recommend trial of lubricating gels such as replens or KY.  If not improved, return for pelvic exam to r/o other cause, then consider trial of vaginal estrogen cream for vaginal atrophy.

## 2011-04-17 NOTE — Progress Notes (Signed)
  Subjective:    Patient ID: Kristen Kirk, female    DOB: May 10, 1951, 60 y.o.   MRN: [id number]  HPI CC: 5 mo f/u DM, HTN  1. DM - hasn't been checking lately.  Hasn't noticed lows.  Diet controlled.  Requests script for glucommeter.  Last vision check was 2 wks ago, elevated pressure so will return for recheck.  2. HTN - seen by renal and stopped amlodipine 2/2 hypotension.  Doing well since then.  No HA, vision changes, CP/tightness, SOB, leg swelling.  3. CKD stage 4 - followed by renal Dr. Alfonse Alpers.  Last saw Dr. Alfonse Alpers 1 mo ago.  Pt says kidneys stable.  Has discussed getting on transplant list.  Pt states considering this, has f/u appt 05/03/2011 at [facility] Lab Results  Component Value Date   CREATININE 2.9* 10/17/2010   4. Encopresis  - still having issues.  Stool sometimes "slips through", even when voiding.  H/o constipation.  Trial of miralax daily, didn't really help.  Would like referral to GI.  Only had 1 child, 5#9oz.  No pain. No bleeding. No leg weakness or numbness, no fevers/chills. seems to happen more at work.  Receives PTNS for urge incontinence at [facility] urology.  5. Dyspareunia - feels very dry, since menopause, recently worse.  States has tried lubricating gels, didn't really help.  Husband frustrated.    6. HLD - on pravastatin 80mg  daily.  Endorsing leg cramps that happen 1-2 times a month, last was last week.  Described as "cramping pain".  Rubbing helps.  Going on since prior to starting pravastatin.  Actually some improvement recently.  Review of Systems Per HPI    Objective:   Physical Exam  Nursing note and vitals reviewed. Constitutional: She appears well-developed and well-nourished. No distress.  HENT:  Head: Normocephalic and atraumatic.  Mouth/Throat: Oropharynx is clear and moist. No oropharyngeal exudate.  Eyes: Conjunctivae and EOM are normal. Pupils are equal, round, and reactive to light. No scleral icterus.  Neck: Normal range of motion.  Neck supple.  Cardiovascular: Normal rate, regular rhythm, normal heart sounds and intact distal pulses.   No murmur heard. Pulses:      Dorsalis pedis pulses are 2+ on the right side, and 2+ on the left side.       Posterior tibial pulses are 2+ on the right side, and 2+ on the left side.  Pulmonary/Chest: Effort normal and breath sounds normal. No respiratory distress. She has no wheezes. She has no rales.  Musculoskeletal: She exhibits no edema.  Lymphadenopathy:    She has no cervical adenopathy.  Skin: Skin is warm and dry.  Psychiatric: She has a normal mood and affect.        Diabetic foot exam: Normal inspection No skin breakdown No calluses  Normal DP/PT pulses Normal sensation to light tough and monofilament Nails normal Assessment & Plan:

## 2011-04-17 NOTE — Assessment & Plan Note (Addendum)
Continue pravastatin. Doubt leg cramps associated as they started prior to starting statin, but continue to monitor. If not improving, consider trial off statin.

## 2011-04-17 NOTE — Assessment & Plan Note (Signed)
Check Cr today.  Seems stable from renal standpoint. Will fax results and note to Dr. Alfonse Alpers. Pt has appt with renal 05/03/2011 for f/u.

## 2011-04-17 NOTE — Assessment & Plan Note (Signed)
With h/o constipation, thought due to impaction, but did not improve with daily miralax although constipation did improve. H/o DM, but well controlled. On nexium but no diarrhea component. only had one vag delivery of 5lb 9 oz baby. H/o urge incontinence, on PTSD per uro, doubt this is related. referral to GI for eval encopresis and anorectal manometry. colonsocopy WNL 2010 Dr. Olevia Perches. KUB neg for impaction.

## 2011-04-17 NOTE — Assessment & Plan Note (Signed)
Remaining good control off amlodipine. Continue other meds for now.

## 2011-04-21 ENCOUNTER — Other Ambulatory Visit: Payer: Self-pay | Admitting: Family Medicine

## 2011-04-21 ENCOUNTER — Telehealth: Payer: Self-pay | Admitting: *Deleted

## 2011-04-21 MED ORDER — GLUCOSE BLOOD VI STRP: ORAL_STRIP | Status: AC

## 2011-04-21 MED ORDER — GLUCOSE BLOOD VI STRP: ORAL_STRIP | Status: DC

## 2011-04-21 NOTE — Telephone Encounter (Signed)
Form faxed with quantity of 100.

## 2011-04-21 NOTE — Telephone Encounter (Signed)
[employer] has faxed a form asking for clarification on quantity of test strips, form is in your in box.

## 2011-04-21 NOTE — Telephone Encounter (Signed)
Filled form and placed in my out box.

## 2011-05-09 ENCOUNTER — Telehealth: Payer: Self-pay | Admitting: *Deleted

## 2011-05-09 NOTE — Telephone Encounter (Signed)
Pt has dropped off FMLA forms for completion, forms are on your desk.

## 2011-05-11 NOTE — Telephone Encounter (Signed)
Called patient and she was asleep per her husband. Left a message with her husband to advise patient that her paperwork was ready for pick up and to make an office visit if anything else was required. He verbalized understanding and said he would giver her the message.

## 2011-05-11 NOTE — Telephone Encounter (Signed)
Notify pt I filled out forms. plz make copy for chart of last 3 sheets, if needs more information filled out, will need OV.

## 2011-06-03 HISTORY — PX: REFRACTIVE SURGERY: SHX103

## 2011-06-03 HISTORY — PX: OTHER SURGICAL HISTORY: SHX169

## 2011-06-06 ENCOUNTER — Encounter: Payer: Self-pay | Admitting: Internal Medicine

## 2011-06-06 ENCOUNTER — Ambulatory Visit (INDEPENDENT_AMBULATORY_CARE_PROVIDER_SITE_OTHER): Payer: 59 | Admitting: Internal Medicine

## 2011-06-06 VITALS — BP 130/82 | HR 68 | Ht 63.0 in | Wt 152.6 lb

## 2011-06-06 DIAGNOSIS — K5901 Slow transit constipation: Secondary | ICD-10-CM

## 2011-06-06 DIAGNOSIS — R159 Full incontinence of feces: Secondary | ICD-10-CM

## 2011-06-06 NOTE — Progress Notes (Signed)
Kristen Kirk Dec 04, 1950 MRN [id number]    History of Present Illness:  This is a 60 year old African American female with constipation and a small amount of leakage of stool through the rectum after she takes a laxative. She has been on MiraLax 17 g on an as necessary basis. It takes about 1-2 days before MiraLax works. She subsequently has  several loose stools which seem to leak. She had a colonoscopy in May 2010 which was a normal exam. She is being treated by Dr Newton Pigg, urologist for overactive bladder. She has a history of high blood pressure, chronic renal insufficiency, and type 2 diabetes.   Past Medical History  Diagnosis Date  . CKD (chronic kidney disease)     stage 4 per [facility] renal (Dr. Alfonse Alpers) considering transplant  . Diabetes mellitus type II     currently diet controlled since losing weight  . HLD (hyperlipidemia)   . GERD (gastroesophageal reflux disease)   . HTN (hypertension) pre-2003  . Urge incontinence     Dr. Matilde Sprang to do PTNS  . Vitamin D deficiency   . Glaucoma   . DJD (degenerative joint disease)   . Osteoarthritis    Past Surgical History  Procedure Date  . Tubal ligation     bilateral  . Bladder suspension     for stress incontinence-Dr. MacDiarmid  . Refractive surgery     reports that she has quit smoking. She has never used smokeless tobacco. She reports that she does not drink alcohol or use illicit drugs. family history includes Hyperlipidemia in her brothers and sisters; Hypertension in her brothers and sisters; and Stroke in her brother. No Known Allergies      Review of Systems:  The remainder of the 10  point ROS is negative except as outlined in H&P   Physical Exam: General appearance  Well developed, in no distress. Eyes- non icteric. HEENT nontraumatic, normocephalic. Mouth no lesions, tongue papillated, no cheilosis. Neck supple without adenopathy, thyroid not enlarged, no carotid bruits, no JVD. Lungs Clear to  auscultation bilaterally. Cor normal S1 normal S2, regular rhythm , no murmur,  quiet precordium. Abdomen soft nontender abdomen with normoactive bowel sounds. No distention. No bruit. Rectal: And anoscopic exam reveals normal perianal area. Rectal tone is normal; she has satisfactory squeeze. There is small amount of followup soft stool which is Hemoccult-negative. The known hemorrhoids. There is no prolapse. Extremities no pedal edema. Skin no lesions. Neurological alert and oriented x 3. Psychological normal mood and affect.  Assessment and Plan:  Problem #1 Fecal incontinence is predominantly secondary to having diarrhea from taking MiraLax. Her basic problem is actually constipation. Rather than taking MiraLax in large amounts infrequently, she needs to take it every day or every other day in small amounts to maintain normal transit time without having loose stools., which leak.. We have talked about a high fiber diet, using fiber supplements and taking MiraLax 9 g regularly every other day or every day so that she may have a regular bowel movement without getting constipated. She would be due for a colonoscopy in 2018.   06/06/2011 Delfin Edis

## 2011-06-06 NOTE — Patient Instructions (Signed)
Dr Lynnae Sandhoff

## 2011-07-03 ENCOUNTER — Telehealth: Payer: Self-pay | Admitting: *Deleted

## 2011-07-03 NOTE — Telephone Encounter (Signed)
Patient came by the office and left a message requesting a vaginal lubricant. Patient noted that the [facility] is not helping. Patient noted that the last time she was in the office something was mentioned about a cream. Pharmacy [facility].

## 2011-07-04 NOTE — Telephone Encounter (Signed)
Patient notified and will come in if replens is no help.

## 2011-07-04 NOTE — Telephone Encounter (Signed)
She can try replens moisturizer 3x/wk - OTC med. If doesn't help, please have her come in for office visit to discuss estrogen therapy.

## 2011-07-16 ENCOUNTER — Other Ambulatory Visit: Payer: Self-pay | Admitting: Family Medicine

## 2011-07-16 DIAGNOSIS — Z0000 Encounter for general adult medical examination without abnormal findings: Secondary | ICD-10-CM

## 2011-07-16 DIAGNOSIS — E559 Vitamin D deficiency, unspecified: Secondary | ICD-10-CM

## 2011-07-16 DIAGNOSIS — N189 Chronic kidney disease, unspecified: Secondary | ICD-10-CM

## 2011-07-16 DIAGNOSIS — E785 Hyperlipidemia, unspecified: Secondary | ICD-10-CM

## 2011-07-16 DIAGNOSIS — I10 Essential (primary) hypertension: Secondary | ICD-10-CM

## 2011-07-16 DIAGNOSIS — E119 Type 2 diabetes mellitus without complications: Secondary | ICD-10-CM

## 2011-07-17 ENCOUNTER — Other Ambulatory Visit (INDEPENDENT_AMBULATORY_CARE_PROVIDER_SITE_OTHER): Payer: 59

## 2011-07-17 DIAGNOSIS — E785 Hyperlipidemia, unspecified: Secondary | ICD-10-CM

## 2011-07-17 DIAGNOSIS — Z0000 Encounter for general adult medical examination without abnormal findings: Secondary | ICD-10-CM

## 2011-07-17 DIAGNOSIS — I10 Essential (primary) hypertension: Secondary | ICD-10-CM

## 2011-07-17 DIAGNOSIS — E559 Vitamin D deficiency, unspecified: Secondary | ICD-10-CM

## 2011-07-17 DIAGNOSIS — N189 Chronic kidney disease, unspecified: Secondary | ICD-10-CM

## 2011-07-17 DIAGNOSIS — E119 Type 2 diabetes mellitus without complications: Secondary | ICD-10-CM

## 2011-07-17 LAB — CBC WITH DIFFERENTIAL/PLATELET
Basophils Relative: 0.2 % (ref 0.0–3.0)
Eosinophils Relative: 2.3 % (ref 0.0–5.0)
HCT: 35.8 % — ABNORMAL LOW (ref 36.0–46.0)
Hemoglobin: 11.8 g/dL — ABNORMAL LOW (ref 12.0–15.0)
Lymphs Abs: 1.5 10*3/uL (ref 0.7–4.0)
MCV: 83.4 fl (ref 78.0–100.0)
Monocytes Absolute: 0.5 10*3/uL (ref 0.1–1.0)
Monocytes Relative: 7.8 % (ref 3.0–12.0)
Neutro Abs: 4.5 10*3/uL (ref 1.4–7.7)
Platelets: 215 10*3/uL (ref 150.0–400.0)
RBC: 4.3 Mil/uL (ref 3.87–5.11)
WBC: 6.7 10*3/uL (ref 4.5–10.5)

## 2011-07-17 LAB — LIPID PANEL
Cholesterol: 151 mg/dL (ref 0–200)
LDL Cholesterol: 84 mg/dL (ref 0–99)
Total CHOL/HDL Ratio: 3
Triglycerides: 91 mg/dL (ref 0.0–149.0)
VLDL: 18.2 mg/dL (ref 0.0–40.0)

## 2011-07-17 LAB — COMPREHENSIVE METABOLIC PANEL
ALT: 20 U/L (ref 0–35)
AST: 21 U/L (ref 0–37)
Alkaline Phosphatase: 54 U/L (ref 39–117)
BUN: 63 mg/dL — ABNORMAL HIGH (ref 6–23)
Chloride: 110 mEq/L (ref 96–112)
Creatinine, Ser: 2.7 mg/dL — ABNORMAL HIGH (ref 0.4–1.2)
Total Bilirubin: 0.5 mg/dL (ref 0.3–1.2)

## 2011-07-17 LAB — TSH: TSH: 1.91 u[IU]/mL (ref 0.35–5.50)

## 2011-07-18 LAB — VITAMIN D 25 HYDROXY (VIT D DEFICIENCY, FRACTURES): Vit D, 25-Hydroxy: 53 ng/mL (ref 30–89)

## 2011-07-20 ENCOUNTER — Ambulatory Visit (INDEPENDENT_AMBULATORY_CARE_PROVIDER_SITE_OTHER): Payer: 59 | Admitting: Family Medicine

## 2011-07-20 ENCOUNTER — Encounter: Payer: Self-pay | Admitting: Family Medicine

## 2011-07-20 VITALS — BP 136/72 | HR 60 | Temp 98.7°F | Ht 63.0 in | Wt 155.0 lb

## 2011-07-20 DIAGNOSIS — Z0000 Encounter for general adult medical examination without abnormal findings: Secondary | ICD-10-CM | POA: Insufficient documentation

## 2011-07-20 DIAGNOSIS — Z1231 Encounter for screening mammogram for malignant neoplasm of breast: Secondary | ICD-10-CM

## 2011-07-20 DIAGNOSIS — N941 Unspecified dyspareunia: Secondary | ICD-10-CM

## 2011-07-20 DIAGNOSIS — IMO0002 Reserved for concepts with insufficient information to code with codable children: Secondary | ICD-10-CM

## 2011-07-20 DIAGNOSIS — Z01419 Encounter for gynecological examination (general) (routine) without abnormal findings: Secondary | ICD-10-CM | POA: Insufficient documentation

## 2011-07-20 DIAGNOSIS — R159 Full incontinence of feces: Secondary | ICD-10-CM

## 2011-07-20 NOTE — Assessment & Plan Note (Addendum)
Reviewed preventative protocols and updated. UTD tetanus, receives flu at work. UTD colonoscopy Breast and pelvic/pap today.  Set up mammogram. Reviewed blood work with patient. Filled out form for work, awaiting cotinine level prior to faxing.

## 2011-07-20 NOTE — Patient Instructions (Addendum)
Try replens moisturizer 3x/wk - OTC med.  Trial of this for 2 weeks.  If not better, call me for estrogen cream. Pass by Marion's office to schedule mammogram. Breast exam and pelvic exam today with pap smear. Good to see you today, call us with questions. We will check cotinine level and fax to insurance. Good to see you today, call us with questions.

## 2011-07-20 NOTE — Assessment & Plan Note (Signed)
Anticipate due to vaginal atrophy.  Pelvic otherwise normal. Vaginal lubrication with sex did not help. Trial of replens for next 1-2 wks.  If not improving, start estrogen cream.

## 2011-07-20 NOTE — Progress Notes (Addendum)
Subjective:    Patient ID: Kristen Kirk, female    DOB: 06-29-1951, 60 y.o.   MRN: [id number]  HPI CC: CPE today  Dyspareunia - feels very dry, since menopause, recently worse. States has tried lubricating gels, didn't really help.   Encoporesis - daily or QOD miralax has improved.  Saw Dr. Olevia Perches for this.  Preventative: Colonoscopy next due 2018.  Normal 2010 Olevia Perches). Gets flu shot at work up to date on tetanus.  h/o normal pap smears last 3 years and in past.  Due today. Due for mammogram this year.   Body mass index is 27.46 kg/(m^2).  Medications and allergies reviewed and updated in chart.  Past histories reviewed and updated if relevant as below. Patient Active Problem List  Diagnoses  . UNSPECIFIED VITAMIN D DEFICIENCY  . Encopresis  . GLAUCOMA NOS  . ALOPECIA  . OSTEOARTHRITIS- EARLY T11-12, T12-L1  4/05  . ELBOW PAIN, LEFT  . HEEL PAIN, RIGHT  . MUSCLE CRAMPS  . SYNCOPE  . VERTIGO  . CKD (chronic kidney disease)  . Diabetes mellitus type II  . HLD (hyperlipidemia)  . GERD (gastroesophageal reflux disease)  . HTN (hypertension)  . Urge incontinence  . Female dyspareunia  . Healthcare maintenance   Past Medical History  Diagnosis Date  . CKD (chronic kidney disease)     stage 4 per [facility] renal (Dr. Alfonse Alpers) considering transplant  . Diabetes mellitus type II     currently diet controlled since losing weight  . HLD (hyperlipidemia)   . GERD (gastroesophageal reflux disease)   . HTN (hypertension) pre-2003  . Urge incontinence     Dr. Matilde Sprang to do PTNS  . Vitamin D deficiency   . Glaucoma     L>R  . DJD (degenerative joint disease)   . Osteoarthritis    Past Surgical History  Procedure Date  . Tubal ligation 1990s    bilateral  . Bladder suspension 2011    for stress incontinence-Dr. MacDiarmid  . Refractive surgery   . Left eye laser surgery 06/2011   History  Substance Use Topics  . Smoking status: Former Research scientist (life sciences)  . Smokeless  tobacco: Never Used   Comment: Remote  . Alcohol Use: No   Family History  Problem Relation Age of Onset  . Stroke Brother   . Hyperlipidemia Brother   . Hypertension Brother   . Hyperlipidemia Brother   . Hypertension Brother   . Hyperlipidemia Sister   . Hypertension Sister   . Hyperlipidemia Sister   . Hypertension Sister    No Known Allergies Current Outpatient Prescriptions on File Prior to Visit  Medication Sig Dispense Refill  . Ascorbic Acid (VITAMIN C) 100 MG tablet Take 100 mg by mouth daily.        . Blood Glucose Monitoring Suppl (ONE TOUCH ULTRA SYSTEM KIT) W/DEVICE KIT 1 kit by Does not apply route once.  1 each  0  . carvedilol (COREG) 6.25 MG tablet Take 6.25 mg by mouth 2 (two) times daily with a meal.        . Cholecalciferol (VITAMIN D3) 1000 UNITS CAPS Take 1 capsule by mouth 2 (two) times daily.        . dorzolamide-timolol (COSOPT) 22.3-6.8 MG/ML ophthalmic solution Place 1 drop into both eyes 3 (three) times daily.        Marland Kitchen esomeprazole (NEXIUM) 40 MG capsule Take 40 mg by mouth daily before breakfast.        . glucose blood test  strip Check sugars once daily  100 each  11  . losartan-hydrochlorothiazide (HYZAAR) 100-12.5 MG per tablet Take 1 tablet by mouth every evening.  30 tablet  0  . meclizine (ANTIVERT) 25 MG tablet Take 1 tablet (25 mg total) by mouth 3 (three) times daily as needed.  30 tablet  1  . Multiple Vitamin (MULTIVITAMIN) tablet Take 1 tablet by mouth daily.        Marland Kitchen oxybutynin (DITROPAN-XL) 10 MG 24 hr tablet Take 10 mg by mouth daily.        . polyethylene glycol (MIRALAX / GLYCOLAX) packet Take 17 g by mouth daily.        . pravastatin (PRAVACHOL) 80 MG tablet Take 80 mg by mouth every evening.        . travoprost, benzalkonium, (TRAVATAN) 0.004 % ophthalmic solution Place 1 drop into both eyes at bedtime.        . vitamin E 100 UNIT capsule Take 100 Units by mouth daily.         Review of Systems  Constitutional: Negative for fever,  chills, activity change, appetite change, fatigue and unexpected weight change.  HENT: Negative for hearing loss and neck pain.   Eyes: Negative for visual disturbance.  Respiratory: Negative for cough, chest tightness, shortness of breath and wheezing.   Cardiovascular: Negative for chest pain, palpitations and leg swelling.  Gastrointestinal: Negative for nausea, vomiting, abdominal pain, diarrhea, constipation, blood in stool and abdominal distention.  Genitourinary: Negative for hematuria and difficulty urinating.  Musculoskeletal: Negative for myalgias and arthralgias.  Skin: Negative for rash.  Neurological: Negative for dizziness, seizures, syncope and headaches.  Hematological: Does not bruise/bleed easily.  Psychiatric/Behavioral: Negative for dysphoric mood. The patient is not nervous/anxious.        Objective:   Physical Exam  Nursing note and vitals reviewed. Constitutional: She is oriented to person, place, and time. She appears well-developed and well-nourished. No distress.  HENT:  Head: Normocephalic and atraumatic.  Right Ear: External ear normal.  Left Ear: External ear normal.  Nose: Nose normal.  Mouth/Throat: Oropharynx is clear and moist.  Eyes: Conjunctivae and EOM are normal. Pupils are equal, round, and reactive to light.  Neck: Normal range of motion. Neck supple. No thyromegaly present.  Cardiovascular: Normal rate, regular rhythm, normal heart sounds and intact distal pulses.   No murmur heard. Pulses:      Radial pulses are 2+ on the right side, and 2+ on the left side.  Pulmonary/Chest: Effort normal and breath sounds normal. No respiratory distress. She has no wheezes. She has no rales. Right breast exhibits no inverted nipple, no mass, no nipple discharge, no skin change and no tenderness. Left breast exhibits no inverted nipple, no mass, no nipple discharge, no skin change and no tenderness. Breasts are symmetrical.  Abdominal: Soft. Bowel sounds are  normal. She exhibits no distension and no mass. There is no tenderness. There is no rebound and no guarding.  Genitourinary: Uterus normal. No breast swelling, tenderness, discharge or bleeding. Pelvic exam was performed with patient supine. There is no rash or tenderness on the right labia. There is no rash or tenderness on the left labia. Cervix exhibits no motion tenderness, no discharge and no friability. No erythema or bleeding around the vagina. No signs of injury around the vagina. No vaginal discharge found.       Vaginal atrophy, dry  Musculoskeletal: Normal range of motion. She exhibits edema (tr pitting).  Lymphadenopathy:  She has no cervical adenopathy.  Neurological: She is alert and oriented to person, place, and time.       CN grossly intact, station and gait intact  Skin: Skin is warm and dry. No rash noted.  Psychiatric: She has a normal mood and affect. Her behavior is normal. Judgment and thought content normal.      Assessment & Plan:

## 2011-07-20 NOTE — Assessment & Plan Note (Signed)
Resolved with more regular use of miralax.

## 2011-07-21 LAB — NICOTINE/COTININE METABOLITES: Cotinine: <10 ng/mL

## 2011-07-24 ENCOUNTER — Encounter: Payer: Self-pay | Admitting: *Deleted

## 2011-07-25 ENCOUNTER — Ambulatory Visit: Payer: 59

## 2011-07-27 ENCOUNTER — Other Ambulatory Visit (HOSPITAL_COMMUNITY)
Admission: RE | Admit: 2011-07-27 | Discharge: 2011-07-27 | Disposition: A | Discharge status: Home / Self Care | Payer: 59 | Source: Ambulatory Visit | Attending: Family Medicine | Admitting: Family Medicine

## 2011-09-14 ENCOUNTER — Ambulatory Visit: Payer: Self-pay

## 2011-10-06 ENCOUNTER — Other Ambulatory Visit: Payer: Self-pay | Admitting: Internal Medicine

## 2011-10-06 MED ORDER — CARVEDILOL 6.25 MG PO TABS: 6.2500 mg | ORAL_TABLET | Freq: Two times a day (BID) | ORAL | Status: DC

## 2011-10-06 MED ORDER — OXYBUTYNIN CHLORIDE ER 10 MG PO TB24: 10.0000 mg | ORAL_TABLET | Freq: Every day | ORAL | Status: DC

## 2011-10-06 MED ORDER — LOSARTAN POTASSIUM-HCTZ 100-12.5 MG PO TABS: 1.0000 | ORAL_TABLET | Freq: Every evening | ORAL | Status: DC

## 2011-10-06 MED ORDER — PRAVASTATIN SODIUM 80 MG PO TABS: 80.0000 mg | ORAL_TABLET | Freq: Every evening | ORAL | Status: DC

## 2011-10-06 NOTE — Telephone Encounter (Signed)
Sent 1 month supply to local pharmacy b/c patient was out of medication.  Then sent 3 month supply to mail order pharmacy.

## 2012-01-10 LAB — CBC
HCT: 38 %
Hemoglobin: 12.3 g/dL (ref 12.0–16.0)

## 2012-01-10 LAB — COMPREHENSIVE METABOLIC PANEL
Albumin: 4.4
Calcium: 9.6 mg/dL
Glucose: 145
Sodium: 136 mmol/L — AB (ref 137–147)

## 2012-01-10 LAB — PROTEIN / CREATININE RATIO, URINE

## 2012-01-29 ENCOUNTER — Encounter: Payer: Self-pay | Admitting: Family Medicine

## 2012-01-29 ENCOUNTER — Ambulatory Visit (INDEPENDENT_AMBULATORY_CARE_PROVIDER_SITE_OTHER): Payer: 59 | Admitting: Family Medicine

## 2012-01-29 VITALS — BP 110/74 | HR 80 | Temp 98.8°F | Wt 154.8 lb

## 2012-01-29 DIAGNOSIS — E119 Type 2 diabetes mellitus without complications: Secondary | ICD-10-CM

## 2012-01-29 DIAGNOSIS — K219 Gastro-esophageal reflux disease without esophagitis: Secondary | ICD-10-CM

## 2012-01-29 DIAGNOSIS — N3941 Urge incontinence: Secondary | ICD-10-CM

## 2012-01-29 DIAGNOSIS — K117 Disturbances of salivary secretion: Secondary | ICD-10-CM

## 2012-01-29 DIAGNOSIS — N941 Unspecified dyspareunia: Secondary | ICD-10-CM

## 2012-01-29 DIAGNOSIS — IMO0002 Reserved for concepts with insufficient information to code with codable children: Secondary | ICD-10-CM

## 2012-01-29 DIAGNOSIS — N189 Chronic kidney disease, unspecified: Secondary | ICD-10-CM

## 2012-01-29 DIAGNOSIS — H409 Unspecified glaucoma: Secondary | ICD-10-CM

## 2012-01-29 DIAGNOSIS — Z23 Encounter for immunization: Secondary | ICD-10-CM

## 2012-01-29 MED ORDER — ESTRADIOL 10 MCG VA TABS: ORAL_TABLET | VAGINAL | Status: DC

## 2012-01-29 MED ORDER — DARIFENACIN HYDROBROMIDE ER 7.5 MG PO TB24: 7.5000 mg | ORAL_TABLET | Freq: Every day | ORAL | Status: DC

## 2012-01-29 NOTE — Assessment & Plan Note (Signed)
Sounds like may need upcoming surgery R eye.

## 2012-01-29 NOTE — Assessment & Plan Note (Signed)
See below.   Oxybutynin XR no longer helping. If enablex does not help, advised f/u with Dr. Matilde Sprang

## 2012-01-29 NOTE — Patient Instructions (Addendum)
Pneumonia shot today. Try vaginal estrogen tablet - vaginally daily for 1 week then twice a week. Stop oxybutynin XL - trial of enablex (darifenacin) daily for bladder.  If not helping, consider checking in with urology for follow up or to see if you can undergo PTNS again. Good to see you today, call us with questions.

## 2012-01-29 NOTE — Progress Notes (Signed)
Addended by: Royann Shivers A on: 01/29/2012 01:10 PM   Modules accepted: Orders

## 2012-01-29 NOTE — Assessment & Plan Note (Addendum)
With dyspareunia. Vag lubricant and moisturizer replens did not help Trial of vagifem.

## 2012-01-29 NOTE — Assessment & Plan Note (Signed)
Anticipate due to anticholinergic. Trial of enablex (more muscarinic selective). rec keep water bottle nearby.  Discussed may need to choose between dry mouth and bladder incontinence control.

## 2012-01-29 NOTE — Assessment & Plan Note (Signed)
Well controlled with nexium, however very expensive. Consider trial of formulary PPI, samples provided today. Pt desires to continue nexium for now.

## 2012-01-29 NOTE — Assessment & Plan Note (Signed)
Good control - A1c this month 6.5%.  Off diabetic meds. Better controlled since losing weight. Due for foot exam next visit.

## 2012-01-29 NOTE — Assessment & Plan Note (Signed)
sounds like on transplant list as far as pt knows.  Hopeful. Cr actually some improved but remains in stage 4 CKD.

## 2012-01-29 NOTE — Progress Notes (Signed)
  Subjective:    Patient ID: Kristen Kirk, female    DOB: 25-Mar-1951, 61 y.o.   MRN: [id number]  HPI CC: several issues  Recently saw nephrology for CKD stage 4 - things stable. Hgb 12.3, A1c 6.5%, Cr 2.5 (actually better than normally).  Still on transplant list.  Dry mouth - going on for several months.  wonders what could be causing this.  Thinks a medicine is causing this.  Thick mucous out of throat, sometimes trouble swallowing when eating as well from dry mouth.  H/o overactive bladder - urge and stress s/p bladder suspension (McDiarmid) - significant urge incontinence - PTNS done, was helping then stopped.  Currently on oxybutynin XL but not working well.  Has tried several other antimuscarinics, states most have caused dry mouth.  Vag atrophy - was using replens which didn't really help.  Interested in vaginal cream.  Prior has been examined  Elevated pressure in eyes - s/p surgery left eye, now told may need R eye surgery.  Followed by ophtho.  Past Medical History  Diagnosis Date  . CKD (chronic kidney disease)     stage 4 per [facility] renal (Dr. Alfonse Alpers) considering transplant  . Diabetes mellitus type II     currently diet controlled since losing weight  . HLD (hyperlipidemia)   . GERD (gastroesophageal reflux disease)   . HTN (hypertension) pre-2003  . Urge incontinence     Dr. Matilde Sprang to do PTNS  . Vitamin d deficiency   . Glaucoma     L>R  . DJD (degenerative joint disease)   . Osteoarthritis    Past Surgical History  Procedure Date  . Tubal ligation 1990s    bilateral  . Bladder suspension 2011    for stress incontinence-Dr. MacDiarmid  . Refractive surgery   . Left eye laser surgery 06/2011  . Colonoscopy 02/04/2009    normal, rec rpt 10 yrs    Review of Systems Per HPI    Objective:   Physical Exam  Nursing note and vitals reviewed. Constitutional: She appears well-developed and well-nourished. No distress.  HENT:  Head: Normocephalic and  atraumatic.  Mouth/Throat: Oropharynx is clear and moist. No oropharyngeal exudate.       MMM  Eyes: Conjunctivae and EOM are normal. Pupils are equal, round, and reactive to light. No scleral icterus.  Neck: Normal range of motion. Neck supple.  Cardiovascular: Normal rate, regular rhythm, normal heart sounds and intact distal pulses.   No murmur heard. Pulmonary/Chest: Effort normal and breath sounds normal. No respiratory distress. She has no wheezes. She has no rales.  Musculoskeletal: She exhibits no edema.  Lymphadenopathy:    She has no cervical adenopathy.  Skin: Skin is warm and dry. No rash noted.  Psychiatric: She has a normal mood and affect.       Assessment & Plan:

## 2012-01-30 ENCOUNTER — Encounter: Payer: Self-pay | Admitting: Family Medicine

## 2012-03-19 ENCOUNTER — Encounter: Payer: Self-pay | Admitting: Family Medicine

## 2012-03-19 ENCOUNTER — Ambulatory Visit (INDEPENDENT_AMBULATORY_CARE_PROVIDER_SITE_OTHER): Payer: 59 | Admitting: Family Medicine

## 2012-03-19 VITALS — BP 142/78 | HR 56 | Temp 98.5°F | Wt 158.2 lb

## 2012-03-19 DIAGNOSIS — E119 Type 2 diabetes mellitus without complications: Secondary | ICD-10-CM

## 2012-03-19 DIAGNOSIS — Z7682 Awaiting organ transplant status: Secondary | ICD-10-CM

## 2012-03-19 DIAGNOSIS — N189 Chronic kidney disease, unspecified: Secondary | ICD-10-CM

## 2012-03-19 DIAGNOSIS — N3941 Urge incontinence: Secondary | ICD-10-CM

## 2012-03-19 DIAGNOSIS — I10 Essential (primary) hypertension: Secondary | ICD-10-CM

## 2012-03-19 DIAGNOSIS — N952 Postmenopausal atrophic vaginitis: Secondary | ICD-10-CM

## 2012-03-19 MED ORDER — ESTRADIOL 0.1 MG/GM VA CREA: 2.0000 g | TOPICAL_CREAM | Freq: Every day | VAGINAL | Status: DC

## 2012-03-19 MED ORDER — CARVEDILOL 6.25 MG PO TABS: 6.2500 mg | ORAL_TABLET | Freq: Two times a day (BID) | ORAL | Status: DC

## 2012-03-19 NOTE — Addendum Note (Signed)
Addended by: Royann Shivers A on: 03/19/2012 03:48 PM   Modules accepted: Orders

## 2012-03-19 NOTE — Progress Notes (Signed)
  Subjective:    Patient ID: Kristen Kirk, female    DOB: 1951/02/20, 61 y.o.   MRN: [id number]  HPI CC: PPD placement and pap smear  Ms Bentley thought she was on kidney transplant list but actually needs copy of pap and ppd sent to [facility] transplant attention Veto [place] at fax # [phone].  We will do this.  Actually had pap smear 07/2011.  Provided pt with copy of normal results. PPD to be placed today.  Was unable to fill latest meds - vagifem and enablex ($100 each).    Still having significant vaginal dryness.  No other concerns today.  BP Readings from Last 3 Encounters:  03/19/12 142/78  01/29/12 110/74  07/20/11 136/72  bp elevated today.  Has been elevated recently - was high at urologist's office as well (170/80 last week).  However at home blood pressures have been running well.  Glaucoma acting up - saw ophthalmology yesterday, R eye with pressure 27.  Pending surgery.  DM - A1c checked in 01/2012 - 6.5%.  Foot exam today.  Past Medical History  Diagnosis Date  . CKD (chronic kidney disease)     stage 4 per [facility] renal (Dr. Alfonse Alpers) considering transplant  . Diabetes mellitus type II     currently diet controlled since losing weight  . HLD (hyperlipidemia)   . GERD (gastroesophageal reflux disease)   . HTN (hypertension) pre-2003  . Urge incontinence     Dr. Matilde Sprang to do PTNS  . Vitamin d deficiency   . Glaucoma     L>R  . DJD (degenerative joint disease)   . Osteoarthritis      Review of Systems Per HPI    Objective:   Physical Exam  Nursing note and vitals reviewed. Constitutional: She appears well-developed and well-nourished. No distress.  HENT:  Head: Normocephalic and atraumatic.  Right Ear: External ear normal.  Left Ear: External ear normal.  Nose: Nose normal.  Mouth/Throat: Oropharynx is clear and moist. No oropharyngeal exudate.  Eyes: Conjunctivae and EOM are normal. Pupils are equal, round, and reactive to light. No scleral  icterus.  Neck: Normal range of motion. Neck supple. Carotid bruit is not present.  Cardiovascular: Normal rate, regular rhythm, normal heart sounds and intact distal pulses.   No murmur heard. Pulmonary/Chest: Effort normal and breath sounds normal. No respiratory distress. She has no wheezes. She has no rales.  Musculoskeletal: She exhibits no edema.       Diabetic foot exam: Normal inspection No skin breakdown No calluses  Normal DP/PT pulses Normal sensation to light touch and slightly diminished to monofilament Nails normal  Lymphadenopathy:    She has no cervical adenopathy.  Skin: Skin is warm and dry. No rash noted.  Psychiatric: She has a normal mood and affect.       Assessment & Plan:

## 2012-03-19 NOTE — Assessment & Plan Note (Signed)
With dyspareunia. Vag lubricant/moisturizer replens did not help. vagifem too expensive.  Sent in estrace cream for pt to price out.

## 2012-03-19 NOTE — Assessment & Plan Note (Signed)
chronic, diet controlled. Foot exam today.

## 2012-03-19 NOTE — Assessment & Plan Note (Signed)
Followed by urology.   

## 2012-03-19 NOTE — Assessment & Plan Note (Signed)
Stable.  Asked Maudie Mercury to fax results of recent pap to [facility] transplant, will also fax over PPD results.

## 2012-03-19 NOTE — Assessment & Plan Note (Signed)
Chronic, deteriorated recently. Advised keep track of BPs, if staying consistently >130/80, to update me to restart amlodipine (low dose 2.5mg  daily)

## 2012-03-19 NOTE — Patient Instructions (Signed)
Ask [facility] to send me copy of mammogram as I do not have this. Keep eye on blood pressures at home.  If consistently >130/80, call me and we will start back on amlodipine (2.5mg  daily). Price out estrogen cream at pharmacy. PPD placed today. Good to see you today, call us with questions.

## 2012-03-21 ENCOUNTER — Telehealth: Payer: Self-pay

## 2012-03-21 NOTE — Telephone Encounter (Signed)
Pt said cream is more expensive that Estadiol tabs. Pt wants to know which Dr Darnell Level recommends.[facility].Please advise.

## 2012-03-22 NOTE — Telephone Encounter (Signed)
Message left advising patient to call and advise if she wants the pills.

## 2012-03-22 NOTE — Telephone Encounter (Signed)
Would do tablets first, but if too expensive ok to hold on for now.

## 2012-03-25 NOTE — Telephone Encounter (Signed)
Pt came by office request 90 day supply Estradiol sent to [facility]. Pt will reschedule PPD in 2 weeks since missed coming back for reading.

## 2012-03-25 NOTE — Telephone Encounter (Signed)
Pt requesting Estradiol tabs. Not on med list.

## 2012-03-26 MED ORDER — ESTRADIOL 10 MCG VA TABS: ORAL_TABLET | VAGINAL | Status: DC

## 2012-03-26 NOTE — Addendum Note (Signed)
Addended by: Ria Bush on: 03/26/2012 10:46 AM   Modules accepted: Orders, Medications

## 2012-03-26 NOTE — Telephone Encounter (Signed)
Notified patient.

## 2012-03-26 NOTE — Telephone Encounter (Signed)
Sent in.  plz notify pt.

## 2012-04-09 ENCOUNTER — Ambulatory Visit (INDEPENDENT_AMBULATORY_CARE_PROVIDER_SITE_OTHER): Payer: 59 | Admitting: *Deleted

## 2012-04-09 DIAGNOSIS — Z139 Encounter for screening, unspecified: Secondary | ICD-10-CM

## 2012-04-11 LAB — TB SKIN TEST: TB Skin Test: NEGATIVE

## 2012-04-17 HISTORY — PX: TRABECULECTOMY: SHX107

## 2012-04-25 ENCOUNTER — Telehealth: Payer: Self-pay | Admitting: Family Medicine

## 2012-04-25 DIAGNOSIS — N952 Postmenopausal atrophic vaginitis: Secondary | ICD-10-CM

## 2012-04-25 DIAGNOSIS — N950 Postmenopausal bleeding: Secondary | ICD-10-CM

## 2012-04-25 NOTE — Telephone Encounter (Signed)
Caller: Veona/Patient; PCP: Ria Bush; CB#: [phone]; ; ; Call regarding Medication Inquiry;   Patient states she was prescribed Vagifem vaginal tablets 04/22/12 for vaginal dryness. Patient states she developed vaginal spotting 04/23/12. States one episode of "medium red" spotting 04/23/12, states one spot "the size of a pencil eraser."  States also had a "very faded" light pink spot on pad 04/24/12. States did not use Vagifem 04/24/12 or 04/25/12. States no bleeding noted 04/25/12. Afebrile. Denies pain.  Denies vaginal discharge. Patient states she has noted odor to urine onset X "Several months." States she was seen by Urologist 04/12/12. States has negative urinalysis and was prescribed Myrbtriq. States has f/u appt. with Urologist 05/06/12.  Triage per Vaginal Bleeding and Urinary SX Protocol.  No emergent sx identified. Care advice given per guidelines. No emergent sx identified. Care advice given per guidelines. Call back paramters reviewed. PATIENT STATES SHE DOES NOT WANT TO CONTINUE TO USE VAGIFEM UNTIL DR. GUTIERREZ IS AWARE OF ABOVE SX AND ADVISES HER TO CONTINUE. PATIENT CAN BE REACHED @ [phone].

## 2012-04-26 ENCOUNTER — Other Ambulatory Visit: Payer: Self-pay | Admitting: Family Medicine

## 2012-04-26 DIAGNOSIS — N950 Postmenopausal bleeding: Secondary | ICD-10-CM

## 2012-04-26 DIAGNOSIS — N952 Postmenopausal atrophic vaginitis: Secondary | ICD-10-CM

## 2012-04-26 NOTE — Telephone Encounter (Signed)
How was she taking tablets?   Likely too much estrogen.  Are tablets able to be cut in 1/2?  If so, would do 1/2 tablet twice a week.  Let's get an ultrasound to check on things prior to restarting vagifem - placed order in chart. If normal [place], will try lower dose.  If continued spotting, may recommend Estrace cream instead.  ==>spoke with pt - took 2 pills and then noted spotting.  Recommended hold vagifem for now, will obtain TVS to eval endometrial thickness, if normal, will try tabs only twice a week.   If continued bleeding, to let me know, could try estrace cream for better control of estrogen dose.

## 2012-05-01 ENCOUNTER — Ambulatory Visit: Payer: Self-pay | Admitting: Family Medicine

## 2012-05-01 ENCOUNTER — Encounter: Payer: Self-pay | Admitting: Family Medicine

## 2012-05-02 ENCOUNTER — Encounter: Payer: Self-pay | Admitting: Family Medicine

## 2012-05-16 ENCOUNTER — Other Ambulatory Visit: Payer: Self-pay

## 2012-05-16 MED ORDER — PRAVASTATIN SODIUM 80 MG PO TABS: 80.0000 mg | ORAL_TABLET | Freq: Every evening | ORAL | Status: DC

## 2012-05-16 NOTE — Telephone Encounter (Signed)
Pt request refill pravastatin #30 to [facility]. Pt out of med and will reorder from [facility] but will take a few weeks to get. Pt notified while on phone refill sent.

## 2012-06-24 ENCOUNTER — Telehealth: Payer: Self-pay | Admitting: Family Medicine

## 2012-06-24 MED ORDER — PRAVASTATIN SODIUM 80 MG PO TABS: 80.0000 mg | ORAL_TABLET | Freq: Every evening | ORAL | Status: DC

## 2012-06-24 NOTE — Telephone Encounter (Signed)
[facility] Triage Call Report Triage Record Num: [id number] Operator: Jani Files Patient Name: Kristen Kirk Call Date & Time: 06/21/2012 5:59:43PM Patient Phone: [phone] PCP: Ria Bush Patient Gender: Female PCP Fax : [phone] Patient DOB: Jan 21, 1951 Practice Name: [facility] Reason for Call: Caller: [name]/Patient; PCP: Ria Bush [facility]); CB#: [phone]; Call regarding Medication Issue; Medication(s): Pravastatin 80mg . daily; Patient states she ran out of her Pravastatin 80mg . daily. States seh took last dosage 06/20/12. RN verified order in [place] Record. Patient was prescribed Pravastatin 80mg . by mouth, one daily every evening. Last filled 05/16/12 for # 30 with no refills. Per standing orders, Pravastatin 80mg .; one by mouth every evening; dispense #3; no refills called into [facility] at [phone]. Spoke with Pharmacist/Tom. Patient informed of above. Patient advised to call office 06/24/12 for refill request. Patient verbalizes understanding Protocol(s) Used: Office Note Recommended Outcome per Protocol: Information Noted and Sent to Office Reason for Outcome: Caller information to office Care Advice:

## 2012-06-24 NOTE — Telephone Encounter (Signed)
plz notify refill sent in.

## 2012-06-24 NOTE — Telephone Encounter (Signed)
Patient notified as instructed by telephone. 

## 2012-08-12 ENCOUNTER — Other Ambulatory Visit: Payer: Self-pay | Admitting: Family Medicine

## 2012-08-15 ENCOUNTER — Other Ambulatory Visit: Payer: Self-pay

## 2012-08-15 MED ORDER — LOSARTAN POTASSIUM-HCTZ 100-12.5 MG PO TABS: ORAL_TABLET | ORAL | Status: DC

## 2012-08-15 NOTE — Telephone Encounter (Signed)
Pt request # 10 losartan hctz to [facility] [address]med sent left message pt to call back.

## 2012-08-16 NOTE — Telephone Encounter (Signed)
Patient notified as instructed by telephone refill done.

## 2012-08-22 ENCOUNTER — Ambulatory Visit (INDEPENDENT_AMBULATORY_CARE_PROVIDER_SITE_OTHER): Payer: 59 | Admitting: Family Medicine

## 2012-08-22 ENCOUNTER — Telehealth: Payer: Self-pay | Admitting: *Deleted

## 2012-08-22 ENCOUNTER — Encounter: Payer: Self-pay | Admitting: Family Medicine

## 2012-08-22 VITALS — BP 128/80 | HR 76 | Temp 98.2°F | Wt 162.5 lb

## 2012-08-22 DIAGNOSIS — M7550 Bursitis of unspecified shoulder: Secondary | ICD-10-CM | POA: Insufficient documentation

## 2012-08-22 DIAGNOSIS — R140 Abdominal distension (gaseous): Secondary | ICD-10-CM

## 2012-08-22 DIAGNOSIS — Z1231 Encounter for screening mammogram for malignant neoplasm of breast: Secondary | ICD-10-CM

## 2012-08-22 DIAGNOSIS — R141 Gas pain: Secondary | ICD-10-CM

## 2012-08-22 DIAGNOSIS — M25519 Pain in unspecified shoulder: Secondary | ICD-10-CM

## 2012-08-22 DIAGNOSIS — M25512 Pain in left shoulder: Secondary | ICD-10-CM

## 2012-08-22 MED ORDER — MECLIZINE HCL 25 MG PO TABS: 25.0000 mg | ORAL_TABLET | Freq: Three times a day (TID) | ORAL | Status: DC | PRN

## 2012-08-22 NOTE — Telephone Encounter (Signed)
Patient needs mammogram referral. She meant to ask at her appt today and forgot.

## 2012-08-22 NOTE — Assessment & Plan Note (Signed)
No red flags.  Anticipate diet- related. Discussed beano and gas x OTC use. Recommended avoid gas producing foods and lactose. Update me if sxs worsen or not improving with above treatment.

## 2012-08-22 NOTE — Assessment & Plan Note (Signed)
L subacromial bursitis with mild RTC tendinopathy. Treat supportively with scheduled tylenol, ice/heat, and stretching exercises from [facility] pt advisor. If not improving with this, return for steroid injection. Avoid NSAIDs 2/2 CKD.

## 2012-08-22 NOTE — Progress Notes (Signed)
Subjective:    Patient ID: Kristen Kirk, female    DOB: 31-Aug-1951, 61 y.o.   MRN: [id number]  HPI CC: gas and shoulder pain  Birthday coming up.  Kristen Kirk is a peasant 61 yo with h/o diet controlled diabetes, CKD stage 4, GERD controlled with PPI PRN, HTN, HLD presents today with 2 concerns today:  1. Gas - present for last several weeks.  No abd pain, nausea/vomiting, no bloating feeling.  Passing gas and belching.  Mild constipation.  Feels like anything she eats worsens gas.  So far hasn't tried anything for this either.  No fevers/chills, BM changes, diarrhea.  No blood in stool.  No early satiety, no weight loss, no dysphagia. Wt Readings from Last 3 Encounters:  08/22/12 162 lb 8 oz (73.71 kg)  03/19/12 158 lb 4 oz (71.782 kg)  01/29/12 154 lb 12 oz (70.194 kg)    2. L shoulder pain - present for the last week.  Denies inciting trauma/injury.  Worse pain with movement, specifically reaching backwards with arm.  No h/o shoulder issues in past.  More difficult to sleep on arm at night.  So far has nothing for this.  No numbness/weakness down arm, denies neck pain.  Pain stays in shoulder, points to anterior shoulder.  Still having some vaginal issues, declines OBGYN referral currently but may consider at beginning of year.  CKD - Followed by Kristen Kirk at [facility].  Stage 4.  Past Medical History  Diagnosis Date  . CKD (chronic kidney disease)     stage 4 per [facility] renal (Kristen Kirk) considering transplant  . Diabetes mellitus type II     currently diet controlled since losing weight  . HLD (hyperlipidemia)   . GERD (gastroesophageal reflux disease)   . HTN (hypertension) pre-2003  . Urge incontinence     Dr. Matilde Sprang to do PTNS  . Vitamin D deficiency   . Glaucoma(365)     L>R  . DJD (degenerative joint disease)   . Osteoarthritis   . Vaginal atrophy     vagifem caused spotting, normal TVS     Review of Systems Per HPI    Objective:   Physical Exam    Nursing note and vitals reviewed. Constitutional: She appears well-developed and well-nourished. No distress.  HENT:  Mouth/Throat: Oropharynx is clear and moist. No oropharyngeal exudate.  Eyes: Conjunctivae normal and EOM are normal. Pupils are equal, round, and reactive to light.  Cardiovascular: Normal rate, regular rhythm, normal heart sounds and intact distal pulses.   No murmur heard. Pulmonary/Chest: Effort normal and breath sounds normal. No respiratory distress. She has no wheezes. She has no rales.  Abdominal: Soft. Bowel sounds are normal. She exhibits no distension and no mass. There is no hepatosplenomegaly. There is no tenderness. There is no rebound, no guarding and no CVA tenderness. Hernia confirmed negative in the ventral area.  Musculoskeletal: She exhibits no edema.       FROM at shoulders but tender with mid range L arm. No neck pain + tender posterior L shoulder to palpation.  No deformity present. + tender with testing int/ext rotation against resistance and + empty can sign No pain at bicipital tendon. No pain at [facility] with crossover test Negative tests for impingement.  Lymphadenopathy:    She has no cervical adenopathy.  Skin: Skin is warm and dry. No rash noted.  Psychiatric: She has a normal mood and affect.       Assessment & Plan:

## 2012-08-22 NOTE — Patient Instructions (Addendum)
For shoulder - I think you have bursitis and possible rotator cuff irritation.  Treat with resting shoulder, ice/heat (which ever soothes better), and tylenol 500mg  2-3 times daily regularly for 1 week.  Do stretching exercises provided today as well. For abdominal issues - take beano with large meals.  Take gas X after meals.  Exclude gas producing foods (beans, onions, celery, carrots, raisins, bananas, apricots, prunes, brussel sprouts, wheat germ, pretzels).  Drink lots of water.   Avoid lactose for now - milk and other dairy products. Let me know if not improving with these measures.

## 2012-08-22 NOTE — Telephone Encounter (Signed)
Placed referral for mammogram in chart.  Will route to [facility].

## 2012-09-06 ENCOUNTER — Ambulatory Visit (INDEPENDENT_AMBULATORY_CARE_PROVIDER_SITE_OTHER): Payer: 59 | Admitting: Family Medicine

## 2012-09-06 ENCOUNTER — Encounter: Payer: Self-pay | Admitting: Family Medicine

## 2012-09-06 VITALS — BP 140/80 | HR 64 | Temp 98.1°F | Wt 162.0 lb

## 2012-09-06 DIAGNOSIS — M7550 Bursitis of unspecified shoulder: Secondary | ICD-10-CM

## 2012-09-06 DIAGNOSIS — IMO0002 Reserved for concepts with insufficient information to code with codable children: Secondary | ICD-10-CM

## 2012-09-06 DIAGNOSIS — M75100 Unspecified rotator cuff tear or rupture of unspecified shoulder, not specified as traumatic: Secondary | ICD-10-CM

## 2012-09-06 MED ORDER — DICLOFENAC SODIUM 1 % TD GEL: 1.0000 "application " | Freq: Three times a day (TID) | TRANSDERMAL | Status: DC

## 2012-09-06 NOTE — Patient Instructions (Signed)
You had steroid injection into left shoulder today. Watch for swelling of shoulder or redness/warmth - if that happens, please seek urgent care. voltaren gel presription sent in today. Let me know if this doesn't help.

## 2012-09-06 NOTE — Assessment & Plan Note (Signed)
With RTC tendinopathy. Steroid injection provided today - pt tolerated well.  Lidocaine caused immediate relief. Discussed red flags to seek urgent care.   Will also provide with voltaren gel script for pt to price out.

## 2012-09-06 NOTE — Progress Notes (Signed)
  Subjective:    Patient ID: Kristen Kirk, female    DOB: 11-13-1950, 61 y.o.   MRN: [id number]  HPI CC: L shoulder pain  See prior note for details.  Last month presumed L subacromial bursitis - rec tylenol (cannot take NSAIDs 2/2 CRI) and rest, ice/heat.  Also provided with exercises.  These things didn't help.  Worse pain with sleep at night and reaching backwards.  Comes in today requesting steroid injection into L shoulder.  No fevers/chills, redness.  Planning trip out of town this weekend.  From last visit: L shoulder pain - present for the last week. Denies inciting trauma/injury. Worse pain with movement, specifically reaching backwards with arm. No h/o shoulder issues in past. More difficult to sleep on arm at night. So far has nothing for this. No numbness/weakness down arm, denies neck pain. Pain stays in shoulder, points to anterior shoulder.   Review of Systems Per HPI    Objective:   Physical Exam AAF, NAD FROM at shoulders but tender with mid range L arm. No neck pain + tender posterior L shoulder to palpation. No deformity present. + tender with testing int/ext rotation against resistance and + empty can sign No pain at bicipital tendon. No pain at [facility] with crossover test Negative tests for impingement.    L shoulder steroid injection:  IC obtained and in chart.  Landmarks palpated and area cleaned with EtOH wipe.  Using posterior lateral approach, steroid injection today - 1cc depo medrol, 4cc lidocaine using 22g 1.5in needle.  Pt tolerated well.    Assessment & Plan:

## 2012-09-17 ENCOUNTER — Telehealth: Payer: Self-pay | Admitting: Family Medicine

## 2012-09-17 NOTE — Telephone Encounter (Signed)
Patient Information:  Caller Name: [place]  Phone: [phone]  Patient: Kristen Kirk, Kristen Kirk  Gender: Female  DOB: June 06, 1951  Age: 61 Years  PCP: Ria Bush [facility])  Office Follow Up:  Does the office need to follow up with this patient?: No  Instructions For The Office: N/A   Symptoms  Reason For Call & Symptoms: c/o cough and congestion; coughed up some phlegm, unk color; sneezing; calling for OTC med suggestions  Reviewed Health History In EMR: Yes  Reviewed Medications In EMR: Yes  Reviewed Allergies In EMR: Yes  Reviewed Surgeries / Procedures: Yes  Date of Onset of Symptoms: 09/16/2012  Guideline(s) Used:  Cough  Disposition Per Guideline:   Home Care  Reason For Disposition Reached:   Cough with cold symptoms (e.g., runny nose, postnasal drip, throat clearing)  Advice Given:  Reassurance  Coughing is the way that our lungs remove irritants and mucus. It helps protect our lungs from getting pneumonia.  You can get a dry hacking cough after a chest cold. Sometimes this type of cough can last 1-3 weeks, and be worse at night.  You can also get a cough after being exposed to irritating substances like smoke, strong perfumes, and dust.  Cough Medicines:  OTC Cough Syrups: The most common cough suppressant in OTC cough medications is dextromethorphan. Often the letters "DM" appear in the name.  OTC Cough Drops: Cough drops can help a lot, especially for mild coughs. They reduce coughing by soothing your irritated throat and removing that tickle sensation in the back of the throat. Cough drops also have the advantage of portability - you can carry them with you.  Home Remedy - Hard Candy: Hard candy works just as well as medicine-flavored OTC cough drops. Diabetics should use sugar-free candy.  Home Remedy - Honey: This old home remedy has been shown to help decrease coughing at night. The adult dosage is 2 teaspoons (10 ml) at bedtime. Honey should not be  given to infants under one year of age.  OTC Cough Syrup - Dextromethorphan:  Cough syrups containing the cough suppressant dextromethorphan (DM) may help decrease your cough. Cough syrups work best for coughs that keep you awake at night. They can also sometimes help in the late stages of a respiratory infection when the cough is dry and hacking. They can be used along with cough drops.  Examples: Benylin, Robitussin DM, Vicks 44 Cough Relief  Caution - Dextromethorphan:   Do not try to completely suppress coughs that produce mucus and phlegm. Remember that coughing is helpful in bringing up mucus from the lungs and preventing pneumonia.  Caution - Dextromethorphan:   Do not try to completely suppress coughs that produce mucus and phlegm. Remember that coughing is helpful in bringing up mucus from the lungs and preventing pneumonia.  Research Notes: Dextromethorphan in some research studies has been shown to reduce the frequency and severity of cough in adults (18 years or older) without significant adverse effects. However, other studies suggest that dextromethorphan is no better than placebo at reducing a cough.  Coughing Spasms:  Drink warm fluids. Inhale warm mist (Reason: both relax the airway and loosen up the phlegm).  Prevent Dehydration:  Drink adequate liquids.  Avoid Tobacco Smoke:  Smoking or being exposed to smoke makes coughs much worse.  Expected Course:   The expected course depends on what is causing the cough.  Viral bronchitis (chest cold) causes a cough that lasts 1 to 3 weeks. Sometimes you  may cough up lots of phlegm (sputum, mucus). The mucus can normally be white, gray, yellow, or green.  Call Back If:  Difficulty breathing  Cough lasts more than 3 weeks  Fever lasts > 3 days  You become worse.

## 2012-09-19 ENCOUNTER — Ambulatory Visit (INDEPENDENT_AMBULATORY_CARE_PROVIDER_SITE_OTHER): Payer: 59 | Admitting: Family Medicine

## 2012-09-19 ENCOUNTER — Encounter: Payer: Self-pay | Admitting: *Deleted

## 2012-09-19 ENCOUNTER — Encounter: Payer: Self-pay | Admitting: Family Medicine

## 2012-09-19 ENCOUNTER — Ambulatory Visit: Payer: Self-pay | Admitting: Family Medicine

## 2012-09-19 VITALS — BP 126/84 | HR 76 | Temp 99.4°F | Wt 155.8 lb

## 2012-09-19 DIAGNOSIS — J111 Influenza due to unidentified influenza virus with other respiratory manifestations: Secondary | ICD-10-CM | POA: Insufficient documentation

## 2012-09-19 DIAGNOSIS — R509 Fever, unspecified: Secondary | ICD-10-CM

## 2012-09-19 DIAGNOSIS — R059 Cough, unspecified: Secondary | ICD-10-CM

## 2012-09-19 DIAGNOSIS — J40 Bronchitis, not specified as acute or chronic: Secondary | ICD-10-CM

## 2012-09-19 DIAGNOSIS — R05 Cough: Secondary | ICD-10-CM

## 2012-09-19 LAB — POCT INFLUENZA A/B: Influenza B, POC: POSITIVE

## 2012-09-19 MED ORDER — OSELTAMIVIR PHOSPHATE 75 MG PO CAPS: 75.0000 mg | ORAL_CAPSULE | Freq: Every day | ORAL | Status: DC

## 2012-09-19 MED ORDER — AZITHROMYCIN 250 MG PO TABS: ORAL_TABLET | ORAL | Status: DC

## 2012-09-19 MED ORDER — HYDROCODONE-HOMATROPINE 5-1.5 MG/5ML PO SYRP: 5.0000 mL | ORAL_SOLUTION | Freq: Every evening | ORAL | Status: DC | PRN

## 2012-09-19 NOTE — Patient Instructions (Addendum)
Flu test was positive - take tamiflu once daily for next 5 days. Treat cough with hycodan cough syrup.  May also use simple mucinex or guaifenesin alone with plenty of fluid to mobilize mucous. Please return to see me if fever >101, or worsening productive cough or not improving as expected. Cut hyzaar pill in half while you feel ill. Push fluids and plenty of rest. Good to see you today, call us with questions.

## 2012-09-19 NOTE — Assessment & Plan Note (Addendum)
Somewhat sudden onset, subjective fevers/chills, productive cough and myalgias - check flu swab for influenza A/B = positive. Take tamiflu renally dosed. Treat cough with hycodan cough syrup In h/o CKD and hyzaar use, recommended cut hyzaar in 1/2 until feeling better.

## 2012-09-19 NOTE — Progress Notes (Signed)
  Subjective:    Patient ID: Kristen Kirk, female    DOB: October 19, 1950, 61 y.o.   MRN: [id number]  HPI CC: cough, fever  2d h/o URTI that started with ST, RN, coryza.  Yesterday acutely worse - worse mildly productive cough, post tussive emesis - clear mucous, subjective fevers and chills.  Has not been keeping track of temperature.  Decreased appetite.  Drinking ok, staying hydrated.  Body aches wit hcoughing.  Did receive flu shot this year.  No abd pain, PNdrainage, ear or tooth pain, HA.  No sick contacts at home. No smokers at home. No h/o asthma. H/o stage 4 CKD, T2DM controlled  Past Medical History  Diagnosis Date  . CKD (chronic kidney disease)     stage 4 per [facility] renal (Dr. Alfonse Alpers) considering transplant  . Diabetes mellitus type II     currently diet controlled since losing weight  . HLD (hyperlipidemia)   . GERD (gastroesophageal reflux disease)   . HTN (hypertension) pre-2003  . Urge incontinence     Dr. Matilde Sprang to do PTNS  . Vitamin D deficiency   . Glaucoma(365)     L>R  . DJD (degenerative joint disease)   . Osteoarthritis   . Vaginal atrophy     vagifem caused spotting, normal TVS     Review of Systems Per HPI    Objective:   Physical Exam  Nursing note and vitals reviewed. Constitutional: She appears well-developed and well-nourished. No distress.       Nontoxic, tired  HENT:  Head: Normocephalic and atraumatic.  Right Ear: Hearing, tympanic membrane, external ear and ear canal normal.  Left Ear: Hearing, tympanic membrane, external ear and ear canal normal.  Nose: Mucosal edema present. No rhinorrhea. Right sinus exhibits no maxillary sinus tenderness and no frontal sinus tenderness. Left sinus exhibits no maxillary sinus tenderness and no frontal sinus tenderness.  Mouth/Throat: Uvula is midline, oropharynx is clear and moist and mucous membranes are normal. No oropharyngeal exudate, posterior oropharyngeal edema, posterior oropharyngeal  erythema or tonsillar abscesses.       irritation of turbinates  Eyes: Conjunctivae normal and EOM are normal. Pupils are equal, round, and reactive to light. No scleral icterus.  Neck: Normal range of motion. Neck supple.  Cardiovascular: Normal rate, regular rhythm, normal heart sounds and intact distal pulses.   No murmur heard. Pulmonary/Chest: Effort normal. No respiratory distress. She has wheezes (slight end-insp wheeze). She has no rales.  Lymphadenopathy:    She has no cervical adenopathy.  Skin: Skin is warm and dry. No rash noted.       Assessment & Plan:

## 2012-09-20 ENCOUNTER — Encounter: Payer: Self-pay | Admitting: Family Medicine

## 2012-09-23 ENCOUNTER — Encounter: Payer: Self-pay | Admitting: *Deleted

## 2012-10-17 ENCOUNTER — Ambulatory Visit (INDEPENDENT_AMBULATORY_CARE_PROVIDER_SITE_OTHER): Payer: 59 | Admitting: Family Medicine

## 2012-10-17 ENCOUNTER — Encounter: Payer: Self-pay | Admitting: Family Medicine

## 2012-10-17 ENCOUNTER — Telehealth: Payer: Self-pay | Admitting: Family Medicine

## 2012-10-17 VITALS — BP 190/90 | HR 60 | Temp 98.1°F | Wt 162.5 lb

## 2012-10-17 DIAGNOSIS — M75100 Unspecified rotator cuff tear or rupture of unspecified shoulder, not specified as traumatic: Secondary | ICD-10-CM

## 2012-10-17 DIAGNOSIS — M7550 Bursitis of unspecified shoulder: Secondary | ICD-10-CM

## 2012-10-17 DIAGNOSIS — IMO0002 Reserved for concepts with insufficient information to code with codable children: Secondary | ICD-10-CM

## 2012-10-17 DIAGNOSIS — I10 Essential (primary) hypertension: Secondary | ICD-10-CM

## 2012-10-17 NOTE — Telephone Encounter (Signed)
Please have patient check blood pressure over next week and to update me if continued high readings like at office visit.

## 2012-10-17 NOTE — Assessment & Plan Note (Signed)
Left sided, s/p steroid injection 09/06/2012 with several weeks of relief. Now with returning pain. Advised to be regular with stretching exercises provided from [facility] pt advisor on subacromial bursitis, provided with resistance band. rec use ice/heat, continue tylenol and voltaren gel. If not better with this in 1-2 weeks, or if any worsening pain or disability, she will call me for referral to ortho. Pt agrees with plan.

## 2012-10-17 NOTE — Assessment & Plan Note (Signed)
Significantly elevated today, reports compliance with meds. Did not repeat blood pressure prior to patient leaving, but I will ask her to keep track of bp at home and update me if consistently high like today (See phone note)

## 2012-10-17 NOTE — Progress Notes (Signed)
  Subjective:    Patient ID: Kristen Kirk, female    DOB: 02-22-1951, 62 y.o.   MRN: [id number]  HPI CC: L shoulder pain  Has been having L shoulder issues for last 2 months, thought subacromial bursitis and mild RTC tendonitis, treated last month 09/06/2012 with steroid shot into subacromial bursa.  Did provide relief for several weeks, now for last 2 weeks having recurrent L shoulder pain that starts at superior shoulder and radiates down upper arm.  Wakes her up out of sleep.  Some days worse than others.  Has been prescribed home exercises for subacromial bursitis but has not been doing.  Treating with tylenol 2-3x/day and voltaren gel.  Denies fevers/chills, neck pain.  BP elevated today - reports compliance with blood pressure meds - has not recently been checking bp at home..  2 days ago had some sausage - more salty than normal.  No HA, vision changes, CP/tightness, SOB, leg swelling.  BP Readings from Last 3 Encounters:  10/17/12 190/90  09/19/12 126/84  09/06/12 140/80    Want to avoid NSAIDs given CKD stage 4.  Followed by Dr. Alfonse Alpers at [facility] 3rd shift.  Past Medical History  Diagnosis Date  . CKD (chronic kidney disease)     stage 4 per [facility] renal (Dr. Alfonse Alpers) considering transplant  . Diabetes mellitus type II     currently diet controlled since losing weight  . HLD (hyperlipidemia)   . GERD (gastroesophageal reflux disease)   . HTN (hypertension) pre-2003  . Urge incontinence     Dr. Matilde Sprang to do PTNS  . Vitamin D deficiency   . Glaucoma(365)     L>R  . DJD (degenerative joint disease)   . Osteoarthritis   . Vaginal atrophy     vagifem caused spotting, normal TVS    Review of Systems Per HPI    Objective:   Physical Exam  Nursing note and vitals reviewed. Constitutional: She appears well-developed and well-nourished. No distress.  Neck: Normal range of motion. Neck supple.  Musculoskeletal: She exhibits no edema.       No midline spine  tenderness. Point tender left subacromial bursa FROM of bilateral shoulders. Neg impingement No pain with testing int/ext rotation of shoulder against resistance. Mild discomfort with empty can sign on left. No pain with rotation of humeral head in [facility] joint       Assessment & Plan:

## 2012-10-17 NOTE — Patient Instructions (Addendum)
I think you still have shoulder bursitis. Continue tylenol, ice/heat, volatern gel, and do stretching exercises provided today. If not improving with this in 1-2 weeks, (or sooner if any worsening) let me know for referral to orthopedist.

## 2012-10-18 NOTE — Telephone Encounter (Signed)
Patient notified

## 2012-10-21 ENCOUNTER — Other Ambulatory Visit: Payer: Self-pay | Admitting: Family Medicine

## 2012-10-25 ENCOUNTER — Other Ambulatory Visit: Payer: Self-pay | Admitting: *Deleted

## 2012-10-25 MED ORDER — LOSARTAN POTASSIUM-HCTZ 100-12.5 MG PO TABS: ORAL_TABLET | ORAL | Status: DC

## 2012-12-02 ENCOUNTER — Other Ambulatory Visit: Payer: Self-pay | Admitting: Family Medicine

## 2012-12-02 MED ORDER — POLYETHYLENE GLYCOL 3350 17 G PO PACK: 17.0000 g | PACK | Freq: Every day | ORAL | Status: DC

## 2013-01-13 ENCOUNTER — Encounter: Payer: Self-pay | Admitting: Family Medicine

## 2013-01-13 ENCOUNTER — Ambulatory Visit (INDEPENDENT_AMBULATORY_CARE_PROVIDER_SITE_OTHER): Payer: 59 | Admitting: Family Medicine

## 2013-01-13 VITALS — BP 150/100 | HR 72 | Temp 98.5°F | Wt 158.2 lb

## 2013-01-13 DIAGNOSIS — G4700 Insomnia, unspecified: Secondary | ICD-10-CM

## 2013-01-13 DIAGNOSIS — R143 Flatulence: Secondary | ICD-10-CM

## 2013-01-13 DIAGNOSIS — I10 Essential (primary) hypertension: Secondary | ICD-10-CM

## 2013-01-13 DIAGNOSIS — N952 Postmenopausal atrophic vaginitis: Secondary | ICD-10-CM

## 2013-01-13 DIAGNOSIS — R140 Abdominal distension (gaseous): Secondary | ICD-10-CM

## 2013-01-13 DIAGNOSIS — R141 Gas pain: Secondary | ICD-10-CM

## 2013-01-13 MED ORDER — POLYETHYLENE GLYCOL 3350 17 GM/SCOOP PO POWD: 17.0000 g | Freq: Every day | ORAL | Status: DC | PRN

## 2013-01-13 NOTE — Patient Instructions (Signed)
For insomnia - may try melatonin or benadryl.  Let me know if not helping. For gassiness - avoid any dairy products for the next 2 weeks to see if improvement in gassiness. Pass by [facility]'s office for referral to GYN.  Lactose Intolerance, Adult Lactose intolerance is when the body is not able to digest lactose, a sugar found in milk and milk products. Lactose intolerance is caused by your body not producing enough of the enzyme lactase. When there is not enough lactase to digest the amount of lactose consumed, discomfort may be felt. Lactose intolerance is not a milk allergy. For most people, lactase deficiency is a condition that develops naturally over time. After about the age of 2, the body begins to produce less lactase. But many people may not experience symptoms until they are much older. CAUSES Things that can cause you to be lactose intolerant include:  Aging.  Being born without the ability to make lactase.  Certain digestive diseases.  Injuries to the small intestine. SYMPTOMS   Feeling sick to your stomach (nauseous).  Diarrhea.  Cramps.  Bloating.  Gas. Symptoms usually show up a half hour or 2 hours after eating or drinking products containing lactose. TREATMENT  No treatment can improve the body's ability to produce lactase. However, symptoms can be controlled through diet. A medicine may be given to you to take when you consume lactose-containing foods or drinks. The medicine contains the lactase enzyme, which help the body digest lactose better. HOME CARE INSTRUCTIONS  Eat or drink dairy products as told by your caregiver or dietician.  Take all medicine as directed by your caregiver.  Find lactose-free or lactose-reduced products at your local grocery store.  Talk to your caregiver or dietician to decide if you need any dietary supplements. The following is the amount of calcium needed from the diet:  19 to 50 years: 1000 mg  Over 50 years: 1200  mg Calcium and Lactose in Common Foods Non-Dairy Products / Calcium Content (mg)  Calcium-fortified orange juice, 1 cup / 308 to 344 mg  Sardines, with edible bones, 3 oz / 270 mg  Salmon, canned, with edible bones, 3 oz / 205 mg  Soymilk, fortified, 1 cup / 200 mg  Broccoli (raw), 1 cup / 90 mg  Orange, 1 medium / 50 mg  Pinto beans,  cup / 40 mg  Tuna, canned, 3 oz / 10 mg  Lettuce greens,  cup / 10 mg Dairy Products / Calcium Content (mg) / Lactose Content (g)  Yogurt, plain, low-fat, 1 cup / 415 mg / 5 g  Milk, reduced fat, 1 cup / 295 mg / 11 g  Swiss cheese, 1 oz / 270 mg / 1 g  Ice cream,  cup / 85 mg / 6 g  Cottage cheese,  cup / 75 mg / 2 to 3 g SEEK MEDICAL CARE IF: You have no relief from your symptoms. Document Released: 09/18/2005 Document Revised: 12/11/2011 Document Reviewed: 12/16/2010 [facility] Patient Information 2013 [place].   Insomnia Insomnia is frequent trouble falling and/or staying asleep. Insomnia can be a long term problem or a short term problem. Both are common. Insomnia can be a short term problem when the wakefulness is related to a certain stress or worry. Long term insomnia is often related to ongoing stress during waking hours and/or poor sleeping habits. Overtime, sleep deprivation itself can make the problem worse. Every little thing feels more severe because you are overtired and your ability to  cope is decreased. CAUSES   Stress, anxiety, and depression.  Poor sleeping habits.  Distractions such as TV in the bedroom.  Naps close to bedtime.  Engaging in emotionally charged conversations before bed.  Technical reading before sleep.  Alcohol and other sedatives. They may make the problem worse. They can hurt normal sleep patterns and normal dream activity.  Stimulants such as caffeine for several hours prior to bedtime.  Pain syndromes and shortness of breath can cause insomnia.  Exercise late at  night.  Changing time zones may cause sleeping problems (jet lag). It is sometimes helpful to have someone observe your sleeping patterns. They should look for periods of not breathing during the night (sleep apnea). They should also look to see how long those periods last. If you live alone or observers are uncertain, you can also be observed at a sleep clinic where your sleep patterns will be professionally monitored. Sleep apnea requires a checkup and treatment. Give your caregivers your medical history. Give your caregivers observations your family has made about your sleep.  SYMPTOMS   Not feeling rested in the morning.  Anxiety and restlessness at bedtime.  Difficulty falling and staying asleep. TREATMENT   Your caregiver may prescribe treatment for an underlying medical disorders. Your caregiver can give advice or help if you are using alcohol or other drugs for self-medication. Treatment of underlying problems will usually eliminate insomnia problems.  Medications can be prescribed for short time use. They are generally not recommended for lengthy use.  Over-the-counter sleep medicines are not recommended for lengthy use. They can be habit forming.  You can promote easier sleeping by making lifestyle changes such as:  Using relaxation techniques that help with breathing and reduce muscle tension.  Exercising earlier in the day.  Changing your diet and the time of your last meal. No night time snacks.  Establish a regular time to go to bed.  Counseling can help with stressful problems and worry.  Soothing music and white noise may be helpful if there are background noises you cannot remove.  Stop tedious detailed work at least one hour before bedtime. HOME CARE INSTRUCTIONS   Keep a diary. Inform your caregiver about your progress. This includes any medication side effects. See your caregiver regularly. Take note of:  Times when you are asleep.  Times when you are awake  during the night.  The quality of your sleep.  How you feel the next day. This information will help your caregiver care for you.  Get out of bed if you are still awake after 15 minutes. Read or do some quiet activity. Keep the lights down. Wait until you feel sleepy and go back to bed.  Keep regular sleeping and waking hours. Avoid naps.  Exercise regularly.  Avoid distractions at bedtime. Distractions include watching television or engaging in any intense or detailed activity like attempting to balance the household checkbook.  Develop a bedtime ritual. Keep a familiar routine of bathing, brushing your teeth, climbing into bed at the same time each night, listening to soothing music. Routines increase the success of falling to sleep faster.  Use relaxation techniques. This can be using breathing and muscle tension release routines. It can also include visualizing peaceful scenes. You can also help control troubling or intruding thoughts by keeping your mind occupied with boring or repetitive thoughts like the old concept of counting sheep. You can make it more creative like imagining planting one beautiful flower after another in your backyard  garden.  During your day, work to eliminate stress. When this is not possible use some of the previous suggestions to help reduce the anxiety that accompanies stressful situations. MAKE SURE YOU:   Understand these instructions.  Will watch your condition.  Will get help right away if you are not doing well or get worse. Document Released: 09/15/2000 Document Revised: 12/11/2011 Document Reviewed: 10/16/2007 [facility] Patient Information 2013 [place].

## 2013-01-13 NOTE — Progress Notes (Signed)
Subjective:    Patient ID: Kristen Kirk, female    DOB: November 12, 1950, 62 y.o.   MRN: [id number]  HPI CC: abd pain, bloating  BP elevated today, but pt states at home running normal.  Did have small piece of ham this morning, and hamburger yesterday. BP Readings from Last 3 Encounters:  01/13/13 150/100  10/17/12 190/90  09/19/12 126/84    abd pain - described mainly as gas pains.  Works around [employer], distressing because at times uncontrollable.  Has tried beano and gas X.  Finds milk products worsen gas. Denies fevers/chills, nausea/vomiting, diarrhea, early satiety, weight changes.  Denies dysuria, urgency.  Wt Readings from Last 3 Encounters:  01/13/13 158 lb 4 oz (71.782 kg)  10/17/12 162 lb 8 oz (73.71 kg)  09/19/12 155 lb 12 oz (70.648 kg)   Taking miralax prn which helps constipation.  Insomnia - trouble falling asleep and trouble staying asleep.  3rd shift - works 10:30pm to 6:30am.  Tries to keep dark environment.  Hasn't tried any meds for this.  Does have bedtime routine.  Notes worsened since lasix started - takes after gets home from work, prior to bedtime.  Vaginal dryness - on estradiol tablet vaginally twice weekly.  Has been on this since August 2013.  Hasn't really helped at all.  Also with hot flashes.  Has tried vaginal lubricants as well.  Continued significant dyspareunia.  Asks about osphena.  Sees Dr. Alfonse Alpers at [facility].  On lasix and iron.  Medications and allergies reviewed and updated in chart.  Past histories reviewed and updated if relevant as below. Patient Active Problem List  Diagnosis  . UNSPECIFIED VITAMIN D DEFICIENCY  . Encopresis  . GLAUCOMA NOS  . ALOPECIA  . OSTEOARTHRITIS- EARLY T11-12, T12-L1  4/05  . ELBOW PAIN, LEFT  . MUSCLE CRAMPS  . SYNCOPE  . VERTIGO  . CKD (chronic kidney disease)  . Diabetes mellitus, type 2  . HLD (hyperlipidemia)  . GERD (gastroesophageal reflux disease)  . HTN (hypertension)  . Urge incontinence  .  Vaginal atrophy  . Healthcare maintenance  . Xerostomia  . Gassiness  . Subacromial bursitis  . Influenza   Past Medical History  Diagnosis Date  . CKD (chronic kidney disease)     stage 4 per [facility] renal (Dr. Alfonse Alpers) considering transplant  . Diabetes mellitus type II     currently diet controlled since losing weight  . HLD (hyperlipidemia)   . GERD (gastroesophageal reflux disease)   . HTN (hypertension) pre-2003  . Urge incontinence     Dr. Matilde Sprang to do PTNS  . Vitamin D deficiency   . Glaucoma(365)     L>R  . DJD (degenerative joint disease)   . Osteoarthritis   . Vaginal atrophy     vagifem caused spotting, normal TVS   Past Surgical History  Procedure Laterality Date  . Tubal ligation  1990s    bilateral  . Bladder suspension  2011    for stress incontinence-Dr. MacDiarmid  . Refractive surgery    . Left eye laser surgery  06/2011  . Colonoscopy  02/04/2009    normal, rec rpt 10 yrs   History  Substance Use Topics  . Smoking status: Former Research scientist (life sciences)  . Smokeless tobacco: Never Used     Comment: Remote  . Alcohol Use: No   Family History  Problem Relation Age of Onset  . Stroke Brother   . Hyperlipidemia Brother   . Hypertension Brother   .  Hyperlipidemia Brother   . Hypertension Brother   . Hyperlipidemia Sister   . Hypertension Sister   . Hyperlipidemia Sister   . Hypertension Sister    No Known Allergies Current Outpatient Prescriptions on File Prior to Visit  Medication Sig Dispense Refill  . Ascorbic Acid (VITAMIN C) 100 MG tablet Take 100 mg by mouth daily.        Marland Kitchen aspirin 81 MG tablet Take 81 mg by mouth daily.      . Blood Glucose Monitoring Suppl (ONE TOUCH ULTRA SYSTEM KIT) W/DEVICE KIT 1 kit by Does not apply route once.  1 each  0  . carvedilol (COREG) 6.25 MG tablet Take 1 tablet (6.25 mg total) by mouth 2 (two) times daily with a meal.  180 tablet  3  . Cholecalciferol (VITAMIN D3) 1000 UNITS CAPS Take 1 capsule by mouth 2 (two) times  daily.        . dorzolamide-timolol (COSOPT) 22.3-6.8 MG/ML ophthalmic solution Place 1 drop into both eyes 3 (three) times daily.        Marland Kitchen esomeprazole (NEXIUM) 40 MG capsule Take 40 mg by mouth daily as needed.       . Estradiol 10 MCG TABS 1 tablet vaginally daily for 1 week then one tablet vaginally twice a week  24 tablet  3  . glucose blood test strip Check sugars once daily  100 each  11  . losartan-hydrochlorothiazide (HYZAAR) 100-12.5 MG per tablet One tablet by mouth every evening.  90 tablet  0  . meclizine (ANTIVERT) 25 MG tablet Take 1 tablet (25 mg total) by mouth 3 (three) times daily as needed.  30 tablet  1  . Multiple Vitamin (MULTIVITAMIN) tablet Take 1 tablet by mouth daily.        . polyethylene glycol (MIRALAX / GLYCOLAX) packet Take 17 g by mouth daily.  14 each  0  . pravastatin (PRAVACHOL) 80 MG tablet Take 1 tablet by mouth   every evening  30 tablet  11  . travoprost, benzalkonium, (TRAVATAN) 0.004 % ophthalmic solution Place 1 drop into both eyes at bedtime.        . vitamin E 100 UNIT capsule Take 100 Units by mouth daily.        . diclofenac sodium (VOLTAREN) 1 % GEL Apply 1 application topically 3 (three) times daily.  1 Tube  1   No current facility-administered medications on file prior to visit.     Review of Systems Per HPI    Objective:   Physical Exam  Nursing note and vitals reviewed. Constitutional: She appears well-developed and well-nourished. No distress.  HENT:  Head: Normocephalic and atraumatic.  Mouth/Throat: Oropharynx is clear and moist. No oropharyngeal exudate.  Eyes: Conjunctivae and EOM are normal. Pupils are equal, round, and reactive to light.  Cardiovascular: Normal rate, regular rhythm, normal heart sounds and intact distal pulses.   No murmur heard. Pulmonary/Chest: Effort normal and breath sounds normal. No respiratory distress. She has no wheezes. She has no rales.  Abdominal: Soft. Normal appearance. She exhibits no distension  and no mass. Bowel sounds are increased. There is no hepatosplenomegaly. There is no tenderness. There is no rigidity, no rebound, no guarding, no CVA tenderness and negative Murphy's sign. No hernia.  Musculoskeletal: She exhibits no edema.  Skin: Skin is warm and dry. No rash noted.  Psychiatric: She has a normal mood and affect.       Assessment & Plan:

## 2013-01-14 ENCOUNTER — Ambulatory Visit: Payer: 59 | Admitting: Obstetrics and Gynecology

## 2013-01-14 ENCOUNTER — Encounter: Payer: Self-pay | Admitting: Family Medicine

## 2013-01-14 DIAGNOSIS — G4700 Insomnia, unspecified: Secondary | ICD-10-CM | POA: Insufficient documentation

## 2013-01-14 NOTE — Assessment & Plan Note (Signed)
With dyspareunia. Did not respond to vaginal lubricant/moisturizer. Has not responded to estrace cream. vagifem was too expensive. Asks about osphena but I don't have experience with this med, although looks like would be able to take despite kidney disease. Will refer to GYN for further eval and discussion of options. Transvaginal US 2013 showed possible small fibroid, otherwise stable.

## 2013-01-14 NOTE — Assessment & Plan Note (Signed)
works 3rd shift, anticipate shift work sleep disorder. Also takes lasix prior to bedtime, wakes up during the night to void.  I suggested she take her lasix in afternoon prior to work. May also do trial of benadryl or melatonin. Provided with sleep hygiene handout. Will try to avoid pharmacotherapy for now.

## 2013-01-14 NOTE — Assessment & Plan Note (Signed)
BP again elevated today, however pt states at home better control.  No changes today. BP Readings from Last 3 Encounters:  01/13/13 150/100  10/17/12 190/90  09/19/12 126/84

## 2013-01-14 NOTE — Assessment & Plan Note (Signed)
Persistent compliant.  No red flags. Has not responded to beano or gas X OTC. Recommended trial of lactose free diet, avoid gas producing foods. miralax has significantly helped constipation.

## 2013-01-16 ENCOUNTER — Emergency Department: Payer: Self-pay | Admitting: Emergency Medicine

## 2013-01-16 LAB — URINALYSIS, COMPLETE
Bacteria: NONE SEEN
Blood: NEGATIVE
Glucose,UR: NEGATIVE mg/dL (ref 0–75)
Nitrite: NEGATIVE
Ph: 6 (ref 4.5–8.0)
WBC UR: 1 /HPF (ref 0–5)

## 2013-01-16 LAB — CBC
HCT: 33.5 % — ABNORMAL LOW (ref 35.0–47.0)
MCH: 26.9 pg (ref 26.0–34.0)
RBC: 4.02 10*6/uL (ref 3.80–5.20)

## 2013-01-16 LAB — COMPREHENSIVE METABOLIC PANEL
Alkaline Phosphatase: 79 U/L (ref 50–136)
BUN: 64 mg/dL — ABNORMAL HIGH (ref 7–18)
Chloride: 109 mmol/L — ABNORMAL HIGH (ref 98–107)
Co2: 25 mmol/L (ref 21–32)
EGFR (African American): 14 — ABNORMAL LOW
EGFR (Non-African Amer.): 12 — ABNORMAL LOW
Glucose: 86 mg/dL (ref 65–99)
Osmolality: 299 (ref 275–301)
Potassium: 3.9 mmol/L (ref 3.5–5.1)
SGOT(AST): 24 U/L (ref 15–37)
Total Protein: 7.7 g/dL (ref 6.4–8.2)

## 2013-01-16 LAB — CK TOTAL AND CKMB (NOT AT ARMC): CK-MB: 2 ng/mL (ref 0.5–3.6)

## 2013-01-16 LAB — TROPONIN I: Troponin-I: <0.02 ng/mL

## 2013-01-20 ENCOUNTER — Ambulatory Visit: Payer: 59 | Admitting: Obstetrics & Gynecology

## 2013-01-28 ENCOUNTER — Other Ambulatory Visit: Payer: Self-pay | Admitting: Obstetrics & Gynecology

## 2013-01-28 ENCOUNTER — Ambulatory Visit (INDEPENDENT_AMBULATORY_CARE_PROVIDER_SITE_OTHER): Payer: 59 | Admitting: Obstetrics & Gynecology

## 2013-01-28 ENCOUNTER — Encounter: Payer: Self-pay | Admitting: Obstetrics & Gynecology

## 2013-01-28 VITALS — BP 149/85 | HR 64 | Ht 63.0 in | Wt 156.0 lb

## 2013-01-28 DIAGNOSIS — N905 Atrophy of vulva: Secondary | ICD-10-CM

## 2013-01-28 DIAGNOSIS — L989 Disorder of the skin and subcutaneous tissue, unspecified: Secondary | ICD-10-CM

## 2013-01-28 MED ORDER — ESTRADIOL 10 MCG VA TABS: ORAL_TABLET | VAGINAL | Status: DC

## 2013-01-28 NOTE — Progress Notes (Signed)
  Subjective:    Patient ID: Kristen Kirk, female    DOB: 1951-04-30, 62 y.o.   MRN: [id number]  HPI  32 M AA P1 who is here today with the complaint of dyspareunia for about 6 months. She has been menopausal for about 4 years. She tried some vaginal estrogen 2 times per week for only 3 weeks and didn't think that it worked.  Review of Systems     Objective:   Physical Exam  Moderate to severe vulvo vaginal atrophy Decrease in pigmentation in a symmetrical fastion on either side of and just below both labia majora Normal bimanual exam  I recommended a vulvar biopsy. A consent was signed I prepped just below the right labium with betadine and infiltrated the area with 1 cc of 1% lidocaine. I did a 3 mm punch biopsy without difficulty. Silver nitrate yielded hemostasis. She tolerated the procedure well      Assessment & Plan:  Dyspareunia due to v v atrophy and possible lichen sclerosis. I will give her samples of vagifem to be used daily for 2 weeks and then on a QOD basis. I will re evaluate in 2 weeks.

## 2013-01-28 NOTE — Progress Notes (Signed)
Referred here for vaginal atrophy, also having pain with intercourse.

## 2013-02-03 LAB — PATHOLOGY

## 2013-02-10 ENCOUNTER — Ambulatory Visit (INDEPENDENT_AMBULATORY_CARE_PROVIDER_SITE_OTHER): Payer: 59 | Admitting: Obstetrics & Gynecology

## 2013-02-10 ENCOUNTER — Encounter: Payer: Self-pay | Admitting: Obstetrics & Gynecology

## 2013-02-10 VITALS — BP 157/94 | HR 70 | Resp 16 | Ht 63.0 in | Wt 155.0 lb

## 2013-02-10 DIAGNOSIS — N905 Atrophy of vulva: Secondary | ICD-10-CM

## 2013-02-10 DIAGNOSIS — IMO0002 Reserved for concepts with insufficient information to code with codable children: Secondary | ICD-10-CM

## 2013-02-10 MED ORDER — OSPEMIFENE 60 MG PO TABS: 1.0000 | ORAL_TABLET | Freq: Every morning | ORAL | Status: DC

## 2013-02-10 NOTE — Progress Notes (Signed)
  Subjective:    Patient ID: Kristen Kirk, female    DOB: April 13, 1951, 62 y.o.   MRN: [id number]  HPI  [name] is a M AA lady with the complaint of dyspareunia for 2 years. She was seen here last month and I saw atrophy as well as an area that I suspected might be lichen sclerosis. I did a vulvar biopsy and the result was "lentigo". She tried the samples of vagifem that I gave her. She developed a pain in her left hip. This pain resolved when she quit using the Vagifem. She then remembered that she had tried this in the past and developed the same pain. She is willing to try Osphena.  Review of Systems  She tells me that her mammogram was done at [facility] in 2013.    Objective:   Physical Exam        Assessment & Plan:  As above

## 2013-03-09 ENCOUNTER — Encounter: Payer: Self-pay | Admitting: Family Medicine

## 2013-03-09 ENCOUNTER — Other Ambulatory Visit: Payer: Self-pay | Admitting: Family Medicine

## 2013-03-24 ENCOUNTER — Encounter: Payer: Self-pay | Admitting: Family Medicine

## 2013-03-24 ENCOUNTER — Ambulatory Visit (INDEPENDENT_AMBULATORY_CARE_PROVIDER_SITE_OTHER): Payer: 59 | Admitting: Family Medicine

## 2013-03-24 VITALS — BP 170/82 | HR 68 | Temp 98.4°F | Wt 155.0 lb

## 2013-03-24 DIAGNOSIS — I10 Essential (primary) hypertension: Secondary | ICD-10-CM

## 2013-03-24 DIAGNOSIS — J069 Acute upper respiratory infection, unspecified: Secondary | ICD-10-CM

## 2013-03-24 DIAGNOSIS — J029 Acute pharyngitis, unspecified: Secondary | ICD-10-CM | POA: Insufficient documentation

## 2013-03-24 MED ORDER — AMLODIPINE BESYLATE 10 MG PO TABS: 10.0000 mg | ORAL_TABLET | Freq: Every day | ORAL | Status: DC

## 2013-03-24 NOTE — Progress Notes (Signed)
Subjective:    Patient ID: Kristen Kirk, female    DOB: 1951-02-09, 62 y.o.   MRN: [id number] 62 yo pt with complicated medical history, including HTN, DM, CKD stage 4, new to me (Dr. Darnell Level pt ) here for urgent care follow up and elevated BP.  Went to urgent care yesterday because she had sore throat, congestion 2-3 days. Rapid strep neg.  Placed on amoxicillin 500 mg twice daily x 10 days.  BP was elevated was elevated to 196/92.  She was and remains asymptomatic.    Sees renal who has been adjusting her blood pressure medication, along with Dr. Darnell Level.  Not taking any decongestants.    Brings in home BP readings- ranging 131/74- 165/78.  Sore throat feels a little better.  Currently taking Coreg 12.5 mg twice daily, Lasix 40 mg daily ONLY.  Med list includes Hyzaar 100-12.5 mg daily but per pt, this was d/c'd by renal. Most recent note by renal, Dr. Mathis Fare was in 03/04/2012 reviewed. BP was 182/84 at that office visit.  She felt elevated BP was due to taking Coreg only once a day but she is now taking twice daily.   Past Medical History  Diagnosis Date  . Chronic kidney disease, stage 4, severely decreased GFR     per [facility] renal (Dr. Alfonse Alpers) considering transplant  . Diabetes mellitus type II     currently diet controlled since losing weight  . HLD (hyperlipidemia)   . GERD (gastroesophageal reflux disease)   . HTN (hypertension) pre-2003  . Urge incontinence     Dr. Matilde Sprang to do PTNS  . Vitamin D deficiency   . Glaucoma     L>R  . DJD (degenerative joint disease)   . Osteoarthritis   . Vaginal atrophy     vagifem caused spotting, normal TVS     Review of Systems Per HPI    + sore throat No HA, blurred vision, CP or SOB. Objective:   Physical Exam  BP 170/82  Pulse 68  Temp(Src) 98.4 F (36.9 C)  Wt 155 lb (70.308 kg)  BMI 27.46 kg/m2  Constitutional: She appears well-developed and well-nourished. No distress.       Nontoxic, tired  HENT:  Head:  Normocephalic and atraumatic.  Right Ear: Hearing, tympanic membrane, external ear and ear canal normal.  Left Ear: Hearing, tympanic membrane, external ear and ear canal normal.  Nose: Mucosal edema present. No rhinorrhea. Right sinus exhibits no maxillary sinus tenderness and no frontal sinus tenderness. Left sinus exhibits no maxillary sinus tenderness and no frontal sinus tenderness.  Mouth/Throat: Uvula is midline, oropharynx is clear and moist and mucous membranes are normal. No oropharyngeal exudate, posterior oropharyngeal edema, posterior oropharyngeal erythema or tonsillar abscesses.       irritation of turbinates  Eyes: Conjunctivae normal and EOM are normal. Pupils are equal, round, and reactive to light. No scleral icterus.  Neck: Normal range of motion. Neck supple.  Cardiovascular: Normal rate, regular rhythm, normal heart sounds and intact distal pulses.   No murmur heard. Pulmonary/Chest: Effort normal. No respiratory distress. She has wheezes (slight end-insp wheeze). She has no rales.  Lymphadenopathy:    She has no cervical adenopathy.  Skin: Skin is warm and dry. No rash noted.       Assessment & Plan:  1. HTN (hypertension) Remains high. Will restart amlodipine 10 mg daily- per pt, did not have any adverse reactions. Follow up with PCP this week.  2. Acute upper respiratory infections  of unspecified site Likely viral but started on amoxicillin so I advised her to finish abx as directed. The patient indicates understanding of these issues and agrees with the plan.

## 2013-03-24 NOTE — Patient Instructions (Addendum)
It was nice to meet you. Let's restart amlodipine 10 mg daily. Please make appt to see Dr. Darnell Level this week.

## 2013-03-26 ENCOUNTER — Encounter: Payer: Self-pay | Admitting: Family Medicine

## 2013-03-26 ENCOUNTER — Ambulatory Visit (INDEPENDENT_AMBULATORY_CARE_PROVIDER_SITE_OTHER): Payer: 59 | Admitting: Family Medicine

## 2013-03-26 VITALS — BP 146/78 | HR 69 | Temp 97.4°F | Wt 156.5 lb

## 2013-03-26 DIAGNOSIS — J069 Acute upper respiratory infection, unspecified: Secondary | ICD-10-CM

## 2013-03-26 DIAGNOSIS — I10 Essential (primary) hypertension: Secondary | ICD-10-CM

## 2013-03-26 DIAGNOSIS — E119 Type 2 diabetes mellitus without complications: Secondary | ICD-10-CM

## 2013-03-26 MED ORDER — GUAIFENESIN-CODEINE 100-10 MG/5ML PO SYRP: 5.0000 mL | ORAL_SOLUTION | Freq: Two times a day (BID) | ORAL | Status: DC | PRN

## 2013-03-26 NOTE — Assessment & Plan Note (Signed)
Improved control on amlodipine - continue amlodipine 10mg  daily and coreg 12.5mg  bid. rtc 1-2 mo for f/u. BP Readings from Last 3 Encounters:  03/26/13 146/78  03/24/13 170/82  02/10/13 157/94

## 2013-03-26 NOTE — Assessment & Plan Note (Signed)
See if A1c done at latest renal blood work.  I asked her to send me copy of blood work from Dr. Alfonse Alpers.

## 2013-03-26 NOTE — Patient Instructions (Addendum)
Continue coreg 12.5mg  twice daily. Continue amlodipine 10mg  once daily. Ask Dr. Irean Hong office to send me copy of latest blood work to update your chart - if no recent A1c ,I will want you to return to [place] for lab visit. Return to see me in 1-2 months for follow up. Blood pressures are looking better with addition of amlodipine 10 mg.

## 2013-03-26 NOTE — Progress Notes (Signed)
  Subjective:    Patient ID: Kristen Kirk, female    DOB: [birth year], 62 y.o.   MRN: [id number]  HPI CC: f/u HTN  See prior note.  Recently elevated blood pressure noted at [facility] - treated for pharyngitis with amoxicillin.  Continues to cough up thick phlegm as well as persistent sore throat and hoarse voice.  No fevers/chills, ear or tooth pain.  Has not taken anything for this so far (other than abx).  Cough keeping her up at night.  Blood pressures have been better since starting amlodipine 10mg  daily.  Brings log of blood pressures over last 2 days showing 136-162/60s (significant improvement).  Next appt with Dr. Alfonse Alpers is next month.  Lab Results  Component Value Date   CREATININE 2.50 01/10/2012   Lab Results  Component Value Date   HGBA1C 6.5 07/17/2011    Past Medical History  Diagnosis Date  . Chronic kidney disease, stage 4, severely decreased GFR     per [facility] renal (Dr. Alfonse Alpers) considering transplant  . Diabetes mellitus type II     currently diet controlled since losing weight  . HLD (hyperlipidemia)   . GERD (gastroesophageal reflux disease)   . HTN (hypertension) pre-2003  . Urge incontinence     Dr. Matilde Sprang to do PTNS  . Vitamin D deficiency   . Glaucoma     L>R  . DJD (degenerative joint disease)   . Osteoarthritis   . Vaginal atrophy     vagifem caused spotting, normal TVS     Review of Systems Per HPI    Objective:   Physical Exam  Nursing note and vitals reviewed. Constitutional: She appears well-developed and well-nourished. No distress.  HENT:  Head: Normocephalic and atraumatic.  Right Ear: Hearing, tympanic membrane, external ear and ear canal normal.  Left Ear: Hearing, tympanic membrane, external ear and ear canal normal.  Mouth/Throat: Uvula is midline, oropharynx is clear and moist and mucous membranes are normal. No oropharyngeal exudate, posterior oropharyngeal edema, posterior oropharyngeal erythema or tonsillar abscesses.  Eyes:  Conjunctivae and EOM are normal. Pupils are equal, round, and reactive to light. No scleral icterus.  Neck: Normal range of motion. Neck supple.  Cardiovascular: Normal rate, regular rhythm, normal heart sounds and intact distal pulses.   No murmur heard. Pulmonary/Chest: Effort normal and breath sounds normal. No respiratory distress. She has no wheezes. She has no rales.  Musculoskeletal: She exhibits no edema.  Lymphadenopathy:    She has no cervical adenopathy.  Skin: Skin is warm and dry. No rash noted.       Assessment & Plan:

## 2013-03-26 NOTE — Assessment & Plan Note (Signed)
Add on cheratussin, complete abx course. Discussed voice rest and throat lozenges.

## 2013-04-10 ENCOUNTER — Other Ambulatory Visit: Payer: Self-pay

## 2013-04-14 ENCOUNTER — Ambulatory Visit (INDEPENDENT_AMBULATORY_CARE_PROVIDER_SITE_OTHER): Payer: 59 | Admitting: Family Medicine

## 2013-04-14 ENCOUNTER — Encounter: Payer: Self-pay | Admitting: Family Medicine

## 2013-04-14 VITALS — BP 146/78 | HR 72 | Temp 98.1°F | Wt 153.5 lb

## 2013-04-14 DIAGNOSIS — J029 Acute pharyngitis, unspecified: Secondary | ICD-10-CM

## 2013-04-14 NOTE — Assessment & Plan Note (Signed)
In setting of recent treatment with amox for presumed strep pharyngitis. Check RST to r/o recurrence. Overall benign exam today. Anticipate viral pharyngitis. Supportive care as per instructions.

## 2013-04-14 NOTE — Progress Notes (Signed)
  Subjective:    Patient ID: Kristen Kirk, female    DOB: [birth year], 62 y.o.   MRN: [id number]  HPI CC: ST  Seen at walk in clinic 3 weeks ago with sore throat, treated w/ amoxicillin for presumed strep pharyngitis - unsure RST results.   Sore throat has returned for the last week Some headaches at work last week.   Denies fevers/chills, ear or tooth pain, nasal congestion, coughing, abd pain. Denies urinary changes. Using cough drops.  No sick contacts at home. No smokers at home.  Saw renal transplant doc, on list.  Wt Readings from Last 3 Encounters:  04/14/13 153 lb 8 oz (69.627 kg)  03/26/13 156 lb 8 oz (70.988 kg)  03/24/13 155 lb (70.308 kg)    Past Medical History  Diagnosis Date  . Chronic kidney disease, stage 4, severely decreased GFR     per [facility] renal (Dr. Alfonse Alpers) considering transplant  . Diabetes mellitus type II     currently diet controlled since losing weight  . HLD (hyperlipidemia)   . GERD (gastroesophageal reflux disease)   . HTN (hypertension) pre-2003  . Urge incontinence     Dr. Matilde Sprang to do PTNS  . Vitamin D deficiency   . Glaucoma     L>R  . DJD (degenerative joint disease)   . Osteoarthritis   . Vaginal atrophy     vagifem caused spotting, normal TVS    Review of Systems Per HPI    Objective:   Physical Exam  Nursing note and vitals reviewed. Constitutional: She appears well-developed and well-nourished. No distress.  HENT:  Head: Normocephalic and atraumatic.  Right Ear: Hearing, tympanic membrane, external ear and ear canal normal.  Left Ear: Hearing, tympanic membrane, external ear and ear canal normal.  Nose: Nose normal. No mucosal edema or rhinorrhea. Right sinus exhibits no maxillary sinus tenderness and no frontal sinus tenderness. Left sinus exhibits no maxillary sinus tenderness and no frontal sinus tenderness.  Mouth/Throat: Uvula is midline and mucous membranes are normal. Posterior oropharyngeal erythema present.  No oropharyngeal exudate, posterior oropharyngeal edema or tonsillar abscesses.  Faint erythematous slivers of posterior oropharynx, no exudates  Eyes: Conjunctivae and EOM are normal. Pupils are equal, round, and reactive to light. No scleral icterus.  Neck: Normal range of motion. Neck supple.  Cardiovascular: Normal rate, regular rhythm, normal heart sounds and intact distal pulses.   No murmur heard. Pulmonary/Chest: Effort normal and breath sounds normal. No respiratory distress. She has no wheezes. She has no rales.  Lymphadenopathy:    She has no cervical adenopathy.  Skin: Skin is warm and dry. No rash noted.       Assessment & Plan:

## 2013-04-14 NOTE — Patient Instructions (Signed)
You have pharyngitis, likely viral. Strep test was negative. Push fluids and plenty of rest. May use tylenol as needed for throat inflammation. Salt water gargles.  Honey with lemon. Suck on cold things like popsicles or warm things like herbal teas (whichever soothes the throat better). Return if fever >101.5, worsening pain, or trouble opening/closing mouth, or hoarse voice. Good to see you today, call clinic with questions.

## 2013-04-28 ENCOUNTER — Encounter: Payer: Self-pay | Admitting: Family Medicine

## 2013-04-28 ENCOUNTER — Ambulatory Visit (INDEPENDENT_AMBULATORY_CARE_PROVIDER_SITE_OTHER): Payer: 59 | Admitting: Family Medicine

## 2013-04-28 VITALS — BP 144/80 | HR 68 | Temp 98.4°F | Wt 156.2 lb

## 2013-04-28 DIAGNOSIS — N184 Chronic kidney disease, stage 4 (severe): Secondary | ICD-10-CM

## 2013-04-28 DIAGNOSIS — E119 Type 2 diabetes mellitus without complications: Secondary | ICD-10-CM

## 2013-04-28 DIAGNOSIS — E785 Hyperlipidemia, unspecified: Secondary | ICD-10-CM

## 2013-04-28 DIAGNOSIS — K219 Gastro-esophageal reflux disease without esophagitis: Secondary | ICD-10-CM

## 2013-04-28 DIAGNOSIS — I10 Essential (primary) hypertension: Secondary | ICD-10-CM

## 2013-04-28 LAB — LIPID PANEL
HDL: 41.1 mg/dL (ref >39.00)
LDL Cholesterol: 96 mg/dL (ref 0–99)
Total CHOL/HDL Ratio: 4
Triglycerides: 126 mg/dL (ref 0.0–149.0)
VLDL: 25.2 mg/dL (ref 0.0–40.0)

## 2013-04-28 LAB — CBC WITH DIFFERENTIAL/PLATELET
Basophils Absolute: 0 10*3/uL (ref 0.0–0.1)
Eosinophils Absolute: 0.2 10*3/uL (ref 0.0–0.7)
HCT: 30.5 % — ABNORMAL LOW (ref 36.0–46.0)
Hemoglobin: 9.9 g/dL — ABNORMAL LOW (ref 12.0–15.0)
Lymphs Abs: 1.5 10*3/uL (ref 0.7–4.0)
MCHC: 32.5 g/dL (ref 30.0–36.0)
MCV: 83.1 fl (ref 78.0–100.0)
Monocytes Absolute: 0.7 10*3/uL (ref 0.1–1.0)
Monocytes Relative: 9.5 % (ref 3.0–12.0)
Neutro Abs: 5 10*3/uL (ref 1.4–7.7)
RDW: 13.6 % (ref 11.5–14.6)

## 2013-04-28 LAB — RENAL FUNCTION PANEL
Albumin: 4 g/dL (ref 3.5–5.2)
BUN: 69 mg/dL — ABNORMAL HIGH (ref 6–23)
CO2: 26 mEq/L (ref 19–32)
Chloride: 107 mEq/L (ref 96–112)
Creatinine, Ser: 4.3 mg/dL — ABNORMAL HIGH (ref 0.4–1.2)

## 2013-04-28 NOTE — Assessment & Plan Note (Signed)
Remains uncontrolled but improved. Consider increase in coreg if remains uncontrolled at CPE. Pt will call me with dose of lasix she takes daily.
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
## 2013-04-28 NOTE — Assessment & Plan Note (Signed)
Check FLP today.  Compliant with pravastatin 80.

## 2013-04-28 NOTE — Assessment & Plan Note (Addendum)
Check blood work today. Continue to follow with [facility].

## 2013-04-28 NOTE — Patient Instructions (Addendum)
Check on lasix dose and let me know. Blood work today. Return in 3 months for physical. Blood pressure is a bit elevated - we will continue to monitor for now, we may increase coreg in the future if staying elevated. Good to see you today, call us with questions.

## 2013-04-28 NOTE — Assessment & Plan Note (Signed)
No recent blood work - so will draw today. H/o diet controlled diabetes - anticipate continued stable. Foot exam today.

## 2013-04-29 ENCOUNTER — Other Ambulatory Visit: Payer: Self-pay | Admitting: Family Medicine

## 2013-05-02 ENCOUNTER — Encounter: Payer: Self-pay | Admitting: *Deleted

## 2013-05-08 ENCOUNTER — Other Ambulatory Visit: Payer: Self-pay

## 2013-05-08 MED ORDER — PRAVASTATIN SODIUM 80 MG PO TABS: ORAL_TABLET | ORAL | Status: DC

## 2013-05-08 NOTE — Telephone Encounter (Signed)
Pt going out of town and will run out of med while gone and cannot get from [place] prior to going out of town.pt notified done.

## 2013-07-09 ENCOUNTER — Ambulatory Visit (INDEPENDENT_AMBULATORY_CARE_PROVIDER_SITE_OTHER): Payer: 59 | Admitting: *Deleted

## 2013-07-09 DIAGNOSIS — Z0279 Encounter for issue of other medical certificate: Secondary | ICD-10-CM

## 2013-07-11 LAB — TB SKIN TEST: Induration: 0 mm

## 2013-07-15 ENCOUNTER — Other Ambulatory Visit: Payer: Self-pay | Admitting: Family Medicine

## 2013-07-19 ENCOUNTER — Other Ambulatory Visit: Payer: Self-pay | Admitting: Family Medicine

## 2013-07-19 DIAGNOSIS — E119 Type 2 diabetes mellitus without complications: Secondary | ICD-10-CM

## 2013-07-19 DIAGNOSIS — N189 Chronic kidney disease, unspecified: Secondary | ICD-10-CM

## 2013-07-19 DIAGNOSIS — I10 Essential (primary) hypertension: Secondary | ICD-10-CM

## 2013-07-19 DIAGNOSIS — E785 Hyperlipidemia, unspecified: Secondary | ICD-10-CM

## 2013-07-19 DIAGNOSIS — E559 Vitamin D deficiency, unspecified: Secondary | ICD-10-CM

## 2013-07-24 ENCOUNTER — Other Ambulatory Visit (INDEPENDENT_AMBULATORY_CARE_PROVIDER_SITE_OTHER): Payer: 59

## 2013-07-24 DIAGNOSIS — N189 Chronic kidney disease, unspecified: Secondary | ICD-10-CM

## 2013-07-24 DIAGNOSIS — E559 Vitamin D deficiency, unspecified: Secondary | ICD-10-CM

## 2013-07-24 DIAGNOSIS — E119 Type 2 diabetes mellitus without complications: Secondary | ICD-10-CM

## 2013-07-25 LAB — CBC WITH DIFFERENTIAL
Basophils Absolute: 0 10*3/uL (ref 0.0–0.2)
Eos: 3 %
HCT: 30 % — ABNORMAL LOW (ref 34.0–46.6)
Hemoglobin: 9.5 g/dL — ABNORMAL LOW (ref 11.1–15.9)
Lymphocytes Absolute: 1.3 10*3/uL (ref 0.7–3.1)
Lymphs: 23 %
MCV: 83 fL (ref 79–97)
Monocytes: 8 %
Neutrophils Absolute: 3.8 10*3/uL (ref 1.4–7.0)
RBC: 3.61 x10E6/uL — ABNORMAL LOW (ref 3.77–5.28)
RDW: 14.1 % (ref 12.3–15.4)
WBC: 5.7 10*3/uL (ref 3.4–10.8)

## 2013-07-25 LAB — RENAL FUNCTION PANEL
Albumin: 4.6 g/dL (ref 3.6–4.8)
BUN/Creatinine Ratio: 17 (ref 11–26)
BUN: 89 mg/dL (ref 8–27)
CO2: 22 mmol/L (ref 18–29)
Chloride: 101 mmol/L (ref 97–108)
Creatinine, Ser: 5.3 mg/dL — ABNORMAL HIGH (ref 0.57–1.00)
GFR calc Af Amer: 9 mL/min/{1.73_m2} — ABNORMAL LOW (ref >59)
Glucose: 97 mg/dL (ref 65–99)
Phosphorus: 4.9 mg/dL — ABNORMAL HIGH (ref 2.5–4.5)

## 2013-07-25 LAB — VITAMIN D 25 HYDROXY (VIT D DEFICIENCY, FRACTURES): Vit D, 25-Hydroxy: 51 ng/mL (ref 30.0–100.0)

## 2013-07-25 LAB — HEMOGLOBIN A1C: Hgb A1c MFr Bld: 6.4 % — ABNORMAL HIGH (ref 4.8–5.6)

## 2013-07-29 ENCOUNTER — Encounter: Payer: Self-pay | Admitting: Family Medicine

## 2013-07-29 ENCOUNTER — Ambulatory Visit (INDEPENDENT_AMBULATORY_CARE_PROVIDER_SITE_OTHER): Payer: 59 | Admitting: Family Medicine

## 2013-07-29 VITALS — BP 140/70 | HR 64 | Temp 98.6°F | Ht 63.0 in | Wt 159.2 lb

## 2013-07-29 DIAGNOSIS — E119 Type 2 diabetes mellitus without complications: Secondary | ICD-10-CM

## 2013-07-29 DIAGNOSIS — I10 Essential (primary) hypertension: Secondary | ICD-10-CM

## 2013-07-29 DIAGNOSIS — N185 Chronic kidney disease, stage 5: Secondary | ICD-10-CM

## 2013-07-29 DIAGNOSIS — E785 Hyperlipidemia, unspecified: Secondary | ICD-10-CM

## 2013-07-29 DIAGNOSIS — Z0000 Encounter for general adult medical examination without abnormal findings: Secondary | ICD-10-CM

## 2013-07-29 DIAGNOSIS — N952 Postmenopausal atrophic vaginitis: Secondary | ICD-10-CM

## 2013-07-29 NOTE — Assessment & Plan Note (Signed)
Continue pravastatin.  LDL 96 (04/2013)

## 2013-07-29 NOTE — Assessment & Plan Note (Signed)
I'm concerned about her kidneys and increasing BUN.  Discussed concerns with failing kidneys, asked her to contact Dr. Alfonse Alpers. I will fax today's office note and blood work to renal [facility].  Appreciate their care of my patient. I also discussed I was worried about safety of upcoming trip to [facility]. Pt remains on renal transplant list. Advised stop myrbetriq and stop voltaren gel.

## 2013-07-29 NOTE — Assessment & Plan Note (Signed)
Higher than her ideal with CKD.  No changes today.

## 2013-07-29 NOTE — Assessment & Plan Note (Signed)
Preventative protocols reviewed and updated unless pt declined. Discussed healthy diet and lifestyle.  

## 2013-07-29 NOTE — Assessment & Plan Note (Signed)
Continues diet controlled.

## 2013-07-29 NOTE — Assessment & Plan Note (Signed)
osphena did not help - now off med.

## 2013-07-29 NOTE — Progress Notes (Signed)
Subjective:    Patient ID: Kristen Kirk, female    DOB: 04-22-1951, 62 y.o.   MRN: [id number]  HPI CC: CPE  BP Readings from Last 3 Encounters:  07/29/13 140/70  04/28/13 144/80  04/14/13 146/78   Last saw Dr. Alfonse Alpers last week - progressively worsening chronic kidney disease over last 3 months.  Has discussed graft/fistula.  Pt wanted to wait until first of the year for this, wants to visit daughter in [place].  Next appt with Dr. Alfonse Alpers is in a few months.  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 Normal 2010 Olevia Perches).  Gets flu shot at work  Tetanus 2009.  Pneumovax 2013 Pap normal 07/2011.  rec rpt 3 year Mammogram 09/2012 normal.  Body mass index is 28.22 kg/(m^2). Wt Readings from Last 3 Encounters:  07/29/13 159 lb 4 oz (72.235 kg)  04/28/13 156 lb 4 oz (70.875 kg)  04/14/13 153 lb 8 oz (69.627 kg)   Medications and allergies reviewed and updated in chart.  Past histories reviewed and updated if relevant as below. Patient Active Problem List   Diagnosis Date Noted  . Insomnia 01/14/2013  . Gassiness 08/22/2012  . Subacromial bursitis 08/22/2012  . Xerostomia 01/29/2012  . Healthcare maintenance 07/20/2011  . Vaginal atrophy 04/17/2011  . CKD (chronic kidney disease)   . Diabetes mellitus, type 2   . HLD (hyperlipidemia)   . GERD (gastroesophageal reflux disease)   . HTN (hypertension)   . Urge incontinence   . SYNCOPE 07/11/2010  . Encopresis 10/26/2009  . UNSPECIFIED VITAMIN D DEFICIENCY 05/19/2009  . MUSCLE CRAMPS 02/22/2009  . ALOPECIA 10/05/2008  . VERTIGO 07/31/2008  . OSTEOARTHRITIS- EARLY T11-12, T12-L1  4/05 01/03/2007  . GLAUCOMA NOS 10/03/1999   Past Medical History  Diagnosis Date  . Chronic kidney disease, stage 4, severely  decreased GFR     per [facility] renal (Dr. Alfonse Alpers) considering transplant  . Diabetes mellitus type II     currently diet controlled since losing weight  . HLD (hyperlipidemia)   . GERD (gastroesophageal reflux disease)   . HTN (hypertension) pre-2003  . Urge incontinence     Dr. Matilde Sprang to do PTNS  . Vitamin D deficiency   . Glaucoma     L>R  . DJD (degenerative joint disease)   . Osteoarthritis   . Vaginal atrophy     vagifem caused spotting, normal TVS   Past Surgical History  Procedure Laterality Date  . Tubal ligation  1990s    bilateral  . Bladder suspension  2011    for stress incontinence-Dr. MacDiarmid  . Refractive surgery    . Left eye laser surgery  06/2011  . Colonoscopy  02/04/2009    normal, rec rpt 10 yrs   History  Substance Use Topics  . Smoking status: Former Research scientist (life sciences)  . Smokeless tobacco: Never Used     Comment: Remote  . Alcohol Use: No   Family History  Problem Relation Age of Onset  . Stroke Brother   . Hyperlipidemia Brother   . Hypertension Brother   . Hyperlipidemia Brother   . Hypertension Brother   . Hyperlipidemia Sister   . Hypertension Sister   . Hyperlipidemia Sister   . Hypertension Sister    No Known Allergies Current Outpatient Prescriptions on File Prior to  Visit  Medication Sig Dispense Refill  . amLODipine (NORVASC) 10 MG tablet TAKE 1 TABLET BY MOUTH DAILY  30 tablet  5  . Ascorbic Acid (VITAMIN C) 100 MG tablet Take 100 mg by mouth daily.        Marland Kitchen aspirin 81 MG tablet Take 81 mg by mouth daily.      . Blood Glucose Monitoring Suppl (ONE TOUCH ULTRA SYSTEM KIT) W/DEVICE KIT 1 kit by Does not apply route once.  1 each  0  . carvedilol (COREG) 12.5 MG tablet Take 1 tablet (12.5 mg total) by mouth 2 (two) times daily with a meal.      . Cholecalciferol (VITAMIN D3) 1000 UNITS CAPS Take 1 capsule by mouth 2 (two) times daily.        . dorzolamide-timolol (COSOPT) 22.3-6.8 MG/ML ophthalmic solution Place 1 drop into both eyes 3  (three) times daily.        Marland Kitchen esomeprazole (NEXIUM) 40 MG capsule Take 40 mg by mouth daily as needed.       . ferrous sulfate 325 (65 FE) MG tablet Take 325 mg by mouth daily with breakfast.      . furosemide (LASIX) 40 MG tablet Take 1 tablet (40 mg total) by mouth daily.  30 tablet    . glucose blood test strip Check sugars once daily  100 each  11  . losartan-hydrochlorothiazide (HYZAAR) 100-12.5 MG per tablet One tablet by mouth every evening.  90 tablet  0  . meclizine (ANTIVERT) 25 MG tablet Take 1 tablet (25 mg total) by mouth 3 (three) times daily as needed.  30 tablet  1  . Multiple Vitamin (MULTIVITAMIN) tablet Take 1 tablet by mouth daily.        . polyethylene glycol powder (GLYCOLAX/MIRALAX) powder Take 17 g by mouth daily as needed.  3350 g  1  . pravastatin (PRAVACHOL) 80 MG tablet Take 1 tablet by mouth   every evening  15 tablet  0  . travoprost, benzalkonium, (TRAVATAN) 0.004 % ophthalmic solution Place 1 drop into both eyes at bedtime.        . traZODone (DESYREL) 50 MG tablet Take 50 mg by mouth at bedtime as needed.       . vitamin E 100 UNIT capsule Take 100 Units by mouth daily.        . diclofenac sodium (VOLTAREN) 1 % GEL Apply 1 application topically 3 (three) times daily.  1 Tube  1   No current facility-administered medications on file prior to visit.      Review of Systems  Constitutional: Negative for fever, chills, activity change, appetite change, fatigue and unexpected weight change.  HENT: Negative for hearing loss.   Eyes: Negative for visual disturbance.  Respiratory: Negative for cough, chest tightness, shortness of breath and wheezing.   Cardiovascular: Positive for leg swelling (seems to come on with use of myrbetriq). Negative for chest pain and palpitations.  Gastrointestinal: Positive for constipation. Negative for nausea, vomiting, abdominal pain, diarrhea, blood in stool and abdominal distention.  Genitourinary: Negative for hematuria and  difficulty urinating.  Musculoskeletal: Negative for arthralgias, myalgias and neck pain.  Skin: Negative for rash.  Neurological: Negative for dizziness, seizures, syncope and headaches.  Hematological: Negative for adenopathy.  Psychiatric/Behavioral: Negative for dysphoric mood. The patient is not nervous/anxious.        Objective:   Physical Exam  Nursing note and vitals reviewed. Constitutional: She is oriented to person, place, and time. She  appears well-developed and well-nourished. No distress.  HENT:  Head: Normocephalic and atraumatic.  Mouth/Throat: Oropharynx is clear and moist. No oropharyngeal exudate.  Eyes: Conjunctivae and EOM are normal. Pupils are equal, round, and reactive to light. No scleral icterus.  Neck: Normal range of motion. Neck supple. Carotid bruit is not present. No thyromegaly present.  Cardiovascular: Normal rate, regular rhythm, normal heart sounds and intact distal pulses.   No murmur heard. Pulses:      Radial pulses are 2+ on the right side, and 2+ on the left side.  Pulmonary/Chest: Effort normal and breath sounds normal. No respiratory distress. She has no wheezes. She has no rales. Right breast exhibits no inverted nipple, no mass, no nipple discharge, no skin change and no tenderness. Left breast exhibits no inverted nipple, no mass, no nipple discharge, no skin change and no tenderness. Breasts are symmetrical.  Abdominal: Soft. Bowel sounds are normal. She exhibits no distension and no mass. There is no tenderness. There is no rebound and no guarding.  Musculoskeletal: Normal range of motion. She exhibits edema (1-2+ pitting edema bilateral lower extremities).  Lymphadenopathy:       Head (right side): No submental, no submandibular, no tonsillar, no preauricular and no posterior auricular adenopathy present.       Head (left side): No submental, no submandibular, no tonsillar, no preauricular and no posterior auricular adenopathy present.     She has no cervical adenopathy.    She has no axillary adenopathy.       Right axillary: No lateral adenopathy present.       Left axillary: No lateral adenopathy present.      Right: No supraclavicular adenopathy present.       Left: No supraclavicular adenopathy present.  Neurological: She is alert and oriented to person, place, and time.  CN grossly intact, station and gait intact  Skin: Skin is warm and dry. No rash noted.  Psychiatric: She has a normal mood and affect. Her behavior is normal. Judgment and thought content normal.      Assessment & Plan:

## 2013-07-29 NOTE — Patient Instructions (Addendum)
I'm worried about your kidneys . As they have gotten worse, I'd like [place] to stop myrbetriq.  Let's hold voltaren gel for now. I want you to touch base with Dr. Irean Hong office about plans for dialysis and upcoming trip to [facility]. Call to schedule mammogram in December. Good to see you today, call us with questions. I wil send today's office note and labs to Dr. Alfonse Alpers.

## 2013-08-21 ENCOUNTER — Other Ambulatory Visit: Payer: Self-pay | Admitting: Family Medicine

## 2013-09-04 ENCOUNTER — Telehealth: Payer: Self-pay

## 2013-09-04 NOTE — Telephone Encounter (Signed)
Pt said she received losartan HCTZ [facility]ill [facility] and pt cannot remember if she was told to stop losartan HCTZ by Dr Darnell Level and pt cannot remember if she has been taking med.pt is sure she has been taking amlodipine 10 mg daily. Pt wants to know if Dr Darnell Level wants pt to take both amlodipine and losartan HCTZ.Both meds are on med list.pt last seen 07/29/13 Pt request cb.

## 2013-09-04 NOTE — Telephone Encounter (Signed)
Patient notified to continue medication. Per note patient only to d/c myrbetriq and voltaren.

## 2013-09-04 NOTE — Telephone Encounter (Signed)
Noted agree

## 2013-09-16 ENCOUNTER — Encounter: Payer: Self-pay | Admitting: Family Medicine

## 2013-09-16 ENCOUNTER — Ambulatory Visit (INDEPENDENT_AMBULATORY_CARE_PROVIDER_SITE_OTHER): Payer: 59 | Admitting: Family Medicine

## 2013-09-16 VITALS — BP 136/70 | HR 63 | Temp 97.8°F | Ht 63.0 in | Wt 161.5 lb

## 2013-09-16 DIAGNOSIS — S46811A Strain of other muscles, fascia and tendons at shoulder and upper arm level, right arm, initial encounter: Secondary | ICD-10-CM

## 2013-09-16 DIAGNOSIS — S46919A Strain of unspecified muscle, fascia and tendon at shoulder and upper arm level, unspecified arm, initial encounter: Secondary | ICD-10-CM

## 2013-09-16 MED ORDER — CYCLOBENZAPRINE HCL 5 MG PO TABS: 5.0000 mg | ORAL_TABLET | Freq: Two times a day (BID) | ORAL | Status: DC | PRN

## 2013-09-16 NOTE — Progress Notes (Signed)
   Subjective:    Patient ID: Kristen Kirk, female    DOB: [birth year], 62 y.o.   MRN: [id number]  HPI CC: left shoulder pain  1 wk h/o L shoulder pain - points to L trapezius muscle superior to shoulder blade as site of pain.  No radiation of pain.  Presented after stressful drive from [place] to [place].  Denies inciting trauma,injury to start pain.   Has tried bengay and heating pad and tylenol. No skin changes or new rashes, no fevers/chills, neck stiffness.  Has not seen Dr. Alfonse Alpers since last visit here 07/29/2013.  Next appt is early next year.  Still planning trip to [facility].    Past Medical History  Diagnosis Date  . Chronic kidney disease, stage 4, severely decreased GFR     per [facility] renal (Dr. Alfonse Alpers) considering transplant  . Diabetes mellitus type II     currently diet controlled since losing weight  . HLD (hyperlipidemia)   . GERD (gastroesophageal reflux disease)   . HTN (hypertension) pre-2003  . Urge incontinence     Dr. Matilde Sprang to do PTNS  . Vitamin D deficiency   . Glaucoma     L>R  . DJD (degenerative joint disease)   . Osteoarthritis   . Vaginal atrophy     vagifem caused spotting, normal TVS     Review of Systems Per HPI    Objective:   Physical Exam  Nursing note and vitals reviewed. Constitutional: She appears well-developed and well-nourished. No distress.  Musculoskeletal: She exhibits no edema.  Some kyphosis present of cervical spine.  Evident tightness and spasm of right infraspinatus muscle. No midline spine tenderness. FROM of shoulders       Assessment & Plan:

## 2013-09-16 NOTE — Progress Notes (Signed)
Pre-visit discussion using our clinic review tool. No additional management support is needed unless otherwise documented below in the visit note.  

## 2013-09-16 NOTE — Assessment & Plan Note (Signed)
Anticipate strain of R infraspinatus with evident tightness/spasm on exam of this muscle. Treat with stretching exercises (provided on upper back pain), ice/heat, continue tylenol, and flexeril course for muscle spasm. Update [place] if not improving as expected.

## 2013-09-16 NOTE — Patient Instructions (Signed)
You have muscle spasm of your left upper back at your infraspinatus muscle - treat with flexeril 1-2 tablets twice daily as needed for this. Continue heating pad/ice (whichever soothes better) and tylenol as needed. Do stretching exercises provided today. Let us know if not improving with this.

## 2013-10-19 ENCOUNTER — Other Ambulatory Visit: Payer: Self-pay | Admitting: Family Medicine

## 2013-10-23 ENCOUNTER — Emergency Department: Payer: Self-pay | Admitting: Emergency Medicine

## 2013-10-23 LAB — COMPREHENSIVE METABOLIC PANEL
ANION GAP: 7 (ref 7–16)
AST: 17 U/L (ref 15–37)
Albumin: 3.9 g/dL (ref 3.4–5.0)
Alkaline Phosphatase: 70 U/L
BILIRUBIN TOTAL: 0.3 mg/dL (ref 0.2–1.0)
BUN: 88 mg/dL — AB (ref 7–18)
Calcium, Total: 9.1 mg/dL (ref 8.5–10.1)
Chloride: 104 mmol/L (ref 98–107)
Co2: 25 mmol/L (ref 21–32)
Creatinine: 6.96 mg/dL — ABNORMAL HIGH (ref 0.60–1.30)
EGFR (African American): 7 — ABNORMAL LOW
GFR CALC NON AF AMER: 6 — AB
GLUCOSE: 150 mg/dL — AB (ref 65–99)
OSMOLALITY: 302 (ref 275–301)
POTASSIUM: 4.3 mmol/L (ref 3.5–5.1)
SGPT (ALT): 28 U/L (ref 12–78)
Sodium: 136 mmol/L (ref 136–145)
Total Protein: 7.7 g/dL (ref 6.4–8.2)

## 2013-10-23 LAB — URINALYSIS, COMPLETE
Bilirubin,UR: NEGATIVE
Blood: NEGATIVE
Glucose,UR: NEGATIVE mg/dL (ref 0–75)
Ketone: NEGATIVE
NITRITE: NEGATIVE
Ph: 6 (ref 4.5–8.0)
RBC,UR: NONE SEEN /HPF (ref 0–5)
Specific Gravity: 1.006 (ref 1.003–1.030)
WBC UR: 1 /HPF (ref 0–5)

## 2013-10-23 LAB — TROPONIN I

## 2013-10-23 LAB — CBC WITH DIFFERENTIAL/PLATELET
Basophil #: 0 10*3/uL (ref 0.0–0.1)
Basophil %: 0.3 %
EOS ABS: 0.1 10*3/uL (ref 0.0–0.7)
Eosinophil %: 2 %
HCT: 29.8 % — ABNORMAL LOW (ref 35.0–47.0)
HGB: 9.6 g/dL — ABNORMAL LOW (ref 12.0–16.0)
LYMPHS ABS: 1.2 10*3/uL (ref 1.0–3.6)
Lymphocyte %: 21.5 %
MCH: 26.7 pg (ref 26.0–34.0)
MCHC: 32.3 g/dL (ref 32.0–36.0)
MCV: 83 fL (ref 80–100)
Monocyte #: 0.6 x10 3/mm (ref 0.2–0.9)
Monocyte %: 10.5 %
NEUTROS PCT: 65.7 %
Neutrophil #: 3.6 10*3/uL (ref 1.4–6.5)
Platelet: 214 10*3/uL (ref 150–440)
RBC: 3.6 10*6/uL — AB (ref 3.80–5.20)
RDW: 13.5 % (ref 11.5–14.5)
WBC: 5.5 10*3/uL (ref 3.6–11.0)

## 2013-10-27 ENCOUNTER — Ambulatory Visit: Payer: 59 | Admitting: Family Medicine

## 2013-10-29 ENCOUNTER — Ambulatory Visit (INDEPENDENT_AMBULATORY_CARE_PROVIDER_SITE_OTHER): Payer: 59 | Admitting: Family Medicine

## 2013-10-29 ENCOUNTER — Encounter: Payer: Self-pay | Admitting: Family Medicine

## 2013-10-29 VITALS — BP 124/64 | HR 64 | Temp 97.5°F | Wt 158.2 lb

## 2013-10-29 DIAGNOSIS — M545 Low back pain, unspecified: Secondary | ICD-10-CM | POA: Insufficient documentation

## 2013-10-29 DIAGNOSIS — I10 Essential (primary) hypertension: Secondary | ICD-10-CM

## 2013-10-29 DIAGNOSIS — N185 Chronic kidney disease, stage 5: Secondary | ICD-10-CM

## 2013-10-29 DIAGNOSIS — E119 Type 2 diabetes mellitus without complications: Secondary | ICD-10-CM

## 2013-10-29 MED ORDER — TRAMADOL HCL 50 MG PO TABS: 25.0000 mg | ORAL_TABLET | Freq: Every day | ORAL | Status: DC | PRN

## 2013-10-29 NOTE — Assessment & Plan Note (Signed)
No red flags today. Provided with exercises from [facility] advisor, rec continued tylenol and may try tramadol for breakthrough pain.  Start at 1/2-1 tablet once daily. Pt will update me with effect, and notify me sooner if worsening pain.

## 2013-10-29 NOTE — Assessment & Plan Note (Signed)
Stable.  Improved reading today.  Continue meds.

## 2013-10-29 NOTE — Progress Notes (Signed)
Pre-visit discussion using our clinic review tool. No additional management support is needed unless otherwise documented below in the visit note.  

## 2013-10-29 NOTE — Progress Notes (Signed)
   Subjective:    Patient ID: Kristen Kirk, female    DOB: 10-06-1950, 62 y.o.   MRN: [id number]  HPI CC: 3 mo f/u today  Last week with stomach virus.  Seen at [facility] with drop in blood pressure to 60/33.  Rehydrated with IVF.  CKD stage 4 - next appt with Dr. Alfonse Alpers is next week 11/03/2013.  Had discussed transplant in past. Lab Results  Component Value Date   CREATININE 5.30* 07/24/2013    DM - regularly does not check sugars.  Compliant with antihyperglycemic regimen which includes: diet controlled.  Denies hypoglycemic symptoms.  Denies paresthesias. Last diabetic eye exam: unsure.  Pneumovax: 01/2012.  HTN - better control noted.  Compliant with meds.  No HA, vision changes, CP/tightness, SOB, leg swelling.  Lower back pain - for months.  Worse with bending forward.  Has tried tylenol for this which doesn't help much.  Denies inciting trauma/injury.  No radiculopathy or numbness/weakness of legs.  No bowel/bladder accidents.  No fevers recently.  Some weak bowel longstanding. Main pain present after working her shift - standing up all night.  Past Medical History  Diagnosis Date  . Chronic kidney disease, stage 4, severely decreased GFR     per [facility] renal (Dr. Alfonse Alpers) considering transplant  . Diabetes mellitus type II     currently diet controlled since losing weight  . HLD (hyperlipidemia)   . GERD (gastroesophageal reflux disease)   . HTN (hypertension) pre-2003  . Urge incontinence     Dr. Matilde Sprang to do PTNS  . Vitamin D deficiency   . Glaucoma     L>R  . DJD (degenerative joint disease)   . Osteoarthritis   . Vaginal atrophy     vagifem caused spotting, normal TVS    Review of Systems Per HPI    Objective:   Physical Exam  Nursing note and vitals reviewed. Constitutional: She appears well-developed and well-nourished. No distress.  HENT:  Mouth/Throat: Oropharynx is clear and moist. No oropharyngeal exudate.  Cardiovascular: Normal rate, regular  rhythm, normal heart sounds and intact distal pulses.   No murmur heard. Pulmonary/Chest: Effort normal and breath sounds normal. No respiratory distress. She has no wheezes. She has no rales.  Musculoskeletal: She exhibits no edema.  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

## 2013-10-29 NOTE — Patient Instructions (Signed)
Back exam looking ok today - this could be some arthritis in the back. Continue tylenol, start stretching exercises.  May try tramadol 1/2 - 1 tablet once daily for pain as needed. Let me know if not improving with this. Continue blood pressure medicines as up to now.

## 2013-10-29 NOTE — Assessment & Plan Note (Signed)
To see renal next week.  I've asked to have note and labwork faxed back to me to stay up to date on treatment plan.

## 2013-10-29 NOTE — Assessment & Plan Note (Signed)
Stays diet controlled.  Will defer blood work this visit as she will have it drawn for renal this week.

## 2013-10-30 LAB — FERRITIN: Ferritin: 237

## 2013-10-30 LAB — IRON AND TIBC
IRON: 77
Iron Saturation: 26
TIBC: 294
UIBC: 217

## 2013-10-30 LAB — HEMOGLOBIN: HGB: 8.7 g/dL

## 2013-10-30 LAB — COMPREHENSIVE METABOLIC PANEL
CREATININE: 6.79
Glucose: 70
PHOSPHORUS: 5.4 mg/dL — AB (ref 2.5–4.9)
Potassium: 4.5 mmol/L
Sodium: 140 mmol/L (ref 137–147)

## 2013-10-31 ENCOUNTER — Telehealth: Payer: Self-pay

## 2013-10-31 NOTE — Telephone Encounter (Signed)
Relevant patient education mailed to patient.  

## 2013-11-03 ENCOUNTER — Telehealth: Payer: Self-pay | Admitting: Family Medicine

## 2013-11-03 NOTE — Telephone Encounter (Signed)
Relevant patient education mailed to patient.  

## 2013-11-07 ENCOUNTER — Encounter: Payer: Self-pay | Admitting: Family Medicine

## 2013-11-07 ENCOUNTER — Other Ambulatory Visit: Payer: Self-pay | Admitting: Family Medicine

## 2013-11-07 MED ORDER — FERROUS SULFATE 325 (65 FE) MG PO TABS: 325.0000 mg | ORAL_TABLET | Freq: Two times a day (BID) | ORAL | Status: DC

## 2013-11-10 ENCOUNTER — Ambulatory Visit: Payer: Self-pay | Admitting: Family Medicine

## 2013-11-11 ENCOUNTER — Encounter: Payer: Self-pay | Admitting: Family Medicine

## 2013-11-11 LAB — HM MAMMOGRAPHY: HM Mammogram: NORMAL

## 2013-11-12 ENCOUNTER — Encounter: Payer: Self-pay | Admitting: *Deleted

## 2013-11-25 ENCOUNTER — Encounter: Payer: Self-pay | Admitting: *Deleted

## 2013-12-12 ENCOUNTER — Encounter: Payer: Self-pay | Admitting: Family Medicine

## 2013-12-12 ENCOUNTER — Other Ambulatory Visit: Payer: Self-pay | Admitting: Family Medicine

## 2013-12-12 MED ORDER — SODIUM BICARBONATE 650 MG PO TABS: 650.0000 mg | ORAL_TABLET | Freq: Three times a day (TID) | ORAL | Status: DC

## 2014-01-03 ENCOUNTER — Other Ambulatory Visit: Payer: Self-pay | Admitting: Family Medicine

## 2014-01-15 ENCOUNTER — Ambulatory Visit (INDEPENDENT_AMBULATORY_CARE_PROVIDER_SITE_OTHER): Payer: 59 | Admitting: Internal Medicine

## 2014-01-15 ENCOUNTER — Encounter: Payer: Self-pay | Admitting: Internal Medicine

## 2014-01-15 VITALS — BP 120/68 | HR 65 | Temp 98.0°F | Wt 156.8 lb

## 2014-01-15 DIAGNOSIS — J209 Acute bronchitis, unspecified: Secondary | ICD-10-CM

## 2014-01-15 DIAGNOSIS — R059 Cough, unspecified: Secondary | ICD-10-CM

## 2014-01-15 DIAGNOSIS — R05 Cough: Secondary | ICD-10-CM

## 2014-01-15 MED ORDER — AMOXICILLIN 250 MG PO CAPS: 250.0000 mg | ORAL_CAPSULE | Freq: Three times a day (TID) | ORAL | Status: DC

## 2014-01-15 MED ORDER — AZITHROMYCIN 250 MG PO TABS: ORAL_TABLET | ORAL | Status: DC

## 2014-01-15 NOTE — Progress Notes (Signed)
Pre visit review using our clinic review tool, if applicable. No additional management support is needed unless otherwise documented below in the visit note. 

## 2014-01-15 NOTE — Progress Notes (Signed)
HPI  Pt presents to the clinic today with c/o couch and chest congestion. She reports this started 1-2 weeks ago. The cough is productive of thick green mucous. She has not tried anything OTC. She denies fever, chills or body aches. She has no history of allergies or breathing problems.  Review of Systems      Past Medical History  Diagnosis Date  . CKD (chronic kidney disease) stage 5, GFR less than 15 ml/min     per [facility] renal (Dr. Alfonse Alpers) considering transplant, referred for HD access  . Diabetes mellitus type II     currently diet controlled since losing weight  . HLD (hyperlipidemia)   . GERD (gastroesophageal reflux disease)   . HTN (hypertension) pre-2003  . Urge incontinence     Dr. Matilde Sprang to do PTNS  . Vitamin D deficiency   . Glaucoma     L>R  . DJD (degenerative joint disease)   . Osteoarthritis   . Vaginal atrophy     vagifem caused spotting, normal TVS  . Anemia in chronic kidney disease     s/p aranesp 11/2013    Family History  Problem Relation Age of Onset  . Stroke Brother   . Hyperlipidemia Brother   . Hypertension Brother   . Hyperlipidemia Brother   . Hypertension Brother   . Hyperlipidemia Sister   . Hypertension Sister   . Hyperlipidemia Sister   . Hypertension Sister     History   Social History  . Marital Status: Married    Spouse Name: N/A    Number of Children: 1  . Years of Education: N/A   Occupational History  . [employer]-lab assistant   .     Social History Main Topics  . Smoking status: Former Research scientist (life sciences)  . Smokeless tobacco: Never Used     Comment: Remote  . Alcohol Use: No  . Drug Use: No  . Sexual Activity: Not on file   Other Topics Concern  . 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 Denies fever or abrupt weight changes.  HEENT:  Denies eye redness, eye pain, pressure behind the eyes, facial pain, nasal congestion, ear pain, ringing in the ears, wax buildup, runny nose or bloody nose. Respiratory: Positive cough. Denies difficulty breathing or shortness of breath.  Cardiovascular: Denies chest pain, chest tightness, palpitations or swelling in the hands or feet.   No other specific complaints in a complete review of systems (except as listed in HPI above).  Objective:   BP 120/68  Pulse 65  Temp(Src) 98 F (36.7 C) (Oral)  Wt 156 lb 12 oz (71.101 kg)  SpO2 99% Wt Readings from Last 3 Encounters:  01/15/14 156 lb 12 oz (71.101 kg)  10/29/13 158 lb 4 oz (71.782 kg)  09/16/13 161 lb 8 oz (73.256 kg)     General: Appears her stated age, well developed, well nourished in NAD. HEENT: Head: normal shape and size; Eyes: sclera white, no icterus, conjunctiva pink, PERRLA and EOMs intact; Ears: Tm's gray and intact, normal light reflex; Nose: mucosa pink and moist, septum midline; Throat/Mouth: + PND. Teeth present, mucosa erythematous and moist, no exudate noted, no lesions  or ulcerations noted.  Neck: Neck supple, trachea midline. No massses, lumps or thyromegaly present.  Cardiovascular: Normal rate and rhythm. S1,S2 noted.  No murmur, rubs or gallops noted. No JVD or BLE edema. No carotid bruits noted. Pulmonary/Chest: Normal effort and scattered rhonchi throughout. No respiratory distress. No wheezes, rales noted.      Assessment & Plan:   Acute Bronchitis:  Get some rest and drink plenty of water eRx for Azithromax x 5 days Delsym OTC  RTC as needed or if symptoms persist.

## 2014-01-15 NOTE — Patient Instructions (Addendum)
Acute Bronchitis Bronchitis is inflammation of the airways that extend from the windpipe into the lungs (bronchi). The inflammation often causes mucus to develop. This leads to a cough, which is the most common symptom of bronchitis.  In acute bronchitis, the condition usually develops suddenly and goes away over time, usually in a couple weeks. Smoking, allergies, and asthma can make bronchitis worse. Repeated episodes of bronchitis may cause further lung problems.  CAUSES Acute bronchitis is most often caused by the same virus that causes a cold. The virus can spread from person to person (contagious).  SIGNS AND SYMPTOMS   Cough.   Fever.   Coughing up mucus.   Body aches.   Chest congestion.   Chills.   Shortness of breath.   Sore throat.  DIAGNOSIS  Acute bronchitis is usually diagnosed through a physical exam. Tests, such as chest X-rays, are sometimes done to rule out other conditions.  TREATMENT  Acute bronchitis usually goes away in a couple weeks. Often times, no medical treatment is necessary. Medicines are sometimes given for relief of fever or cough. Antibiotics are usually not needed but may be prescribed in certain situations. In some cases, an inhaler may be recommended to help reduce shortness of breath and control the cough. A cool mist vaporizer may also be used to help thin bronchial secretions and make it easier to clear the chest.  HOME CARE INSTRUCTIONS  Get plenty of rest.   Drink enough fluids to keep your urine clear or pale yellow (unless you have a medical condition that requires fluid restriction). Increasing fluids may help thin your secretions and will prevent dehydration.   Only take over-the-counter or prescription medicines as directed by your health care provider.   Avoid smoking and secondhand smoke. Exposure to cigarette smoke or irritating chemicals will make bronchitis worse. If you are a smoker, consider using nicotine gum or skin  patches to help control withdrawal symptoms. Quitting smoking will help your lungs heal faster.   Reduce the chances of another bout of acute bronchitis by washing your hands frequently, avoiding people with cold symptoms, and trying not to touch your hands to your mouth, nose, or eyes.   Follow up with your health care provider as directed.  SEEK MEDICAL CARE IF: Your symptoms do not improve after 1 week of treatment.  SEEK IMMEDIATE MEDICAL CARE IF:  You develop an increased fever or chills.   You have chest pain.   You have severe shortness of breath.  You have bloody sputum.   You develop dehydration.  You develop fainting.  You develop repeated vomiting.  You develop a severe headache. MAKE SURE YOU:   Understand these instructions.  Will watch your condition.  Will get help right away if you are not doing well or get worse. Document Released: 10/26/2004 Document Revised: 05/21/2013 Document Reviewed: 03/11/2013 ExitCare Patient Information 2014 [employer].  

## 2014-01-30 DIAGNOSIS — N185 Chronic kidney disease, stage 5: Secondary | ICD-10-CM

## 2014-01-30 HISTORY — DX: Chronic kidney disease, stage 5: N18.5

## 2014-02-20 HISTORY — PX: AV FISTULA PLACEMENT, RADIOCEPHALIC: SHX1208

## 2014-02-23 LAB — HM DIABETES EYE EXAM

## 2014-02-26 ENCOUNTER — Ambulatory Visit: Payer: 59 | Admitting: Family Medicine

## 2014-03-02 ENCOUNTER — Ambulatory Visit (INDEPENDENT_AMBULATORY_CARE_PROVIDER_SITE_OTHER): Payer: 59 | Admitting: Family Medicine

## 2014-03-02 ENCOUNTER — Encounter: Payer: Self-pay | Admitting: Family Medicine

## 2014-03-02 ENCOUNTER — Telehealth: Payer: Self-pay | Admitting: Family Medicine

## 2014-03-02 VITALS — BP 142/72 | HR 61 | Temp 98.2°F | Wt 153.0 lb

## 2014-03-02 DIAGNOSIS — E1129 Type 2 diabetes mellitus with other diabetic kidney complication: Secondary | ICD-10-CM

## 2014-03-02 DIAGNOSIS — N058 Unspecified nephritic syndrome with other morphologic changes: Secondary | ICD-10-CM

## 2014-03-02 DIAGNOSIS — I10 Essential (primary) hypertension: Secondary | ICD-10-CM

## 2014-03-02 DIAGNOSIS — E1121 Type 2 diabetes mellitus with diabetic nephropathy: Secondary | ICD-10-CM

## 2014-03-02 DIAGNOSIS — N185 Chronic kidney disease, stage 5: Secondary | ICD-10-CM

## 2014-03-02 MED ORDER — ONETOUCH ULTRA SYSTEM W/DEVICE KIT: 1.0000 | PACK | Freq: Once | Status: AC

## 2014-03-02 NOTE — Patient Instructions (Signed)
Good to see you today. I think you are doing well. Return in 5-6 months for physical, prior fasting for blood work.

## 2014-03-02 NOTE — Assessment & Plan Note (Signed)
Chronic, stable. Well controlled on current regimen - continue.  

## 2014-03-02 NOTE — Assessment & Plan Note (Signed)
New fistula LUE as of last week, still maturing. Establishing with HD center for anticipated upcoming need for dialysis.

## 2014-03-02 NOTE — Progress Notes (Signed)
BP 142/72  Pulse 61  Temp(Src) 98.2 F (36.8 C) (Oral)  Wt 153 lb (69.4 kg)  SpO2 99%   CC: 4 mo f/u  Subjective:    Patient ID: Kristen Kirk, female    DOB: [birth year], 63 y.o.   MRN: [id number]  HPI: Kristen Kirk is a 63 y.o. female presenting on 03/02/2014 for Follow-up   CKD (chronic kidney disease) stage 5, GFR less than 15 ml/min per [facility] renal (Dr. Alfonse Alpers) considering transplant, fistula placed 1 wk ago [facility]). LUE fistula.  Anemia of CKD - on aranesp Q6 wks, hgb last checked 9.0.  DM - diet controlled since losing weight. Fasting sugars range 89-101. Eye doctor - 1 wk ago with new glasses.  HTN - compliant with regimen which includes lasix 40mg  bid, coreg 12.5mg  bid, and amlodipine 10mg  daily.  bp at home averages 140/60. No HA, vision changes, CP/tightness, SOB. No low bp readings or sxs. Leg swelling stable.  Relevant past medical, surgical, family and social history reviewed and updated as indicated.  Allergies and medications reviewed and updated. Current Outpatient Prescriptions on File Prior to Visit  Medication Sig  . amLODipine (NORVASC) 10 MG tablet TAKE 1 TABLET BY MOUTH DAILY  . aspirin 81 MG tablet Take 81 mg by mouth daily.  . carvedilol (COREG) 12.5 MG tablet Take 1 tablet (12.5 mg total) by mouth 2 (two) times daily with a meal.  . Cholecalciferol (VITAMIN D3) 1000 UNITS CAPS Take 1 capsule by mouth 2 (two) times daily.    . cyclobenzaprine (FLEXERIL) 5 MG tablet Take 1-2 tablets (5-10 mg total) by mouth 2 (two) times daily as needed for muscle spasms.  . darbepoetin alfa-polysorbate (ARANESP, ALBUMIN FREE,) 60 MCG/ML injection Inject 60 mcg into the skin every 6 (six) weeks. Per renal  . dorzolamide-timolol (COSOPT) 22.3-6.8 MG/ML ophthalmic solution Place 1 drop into both eyes 3 (three) times daily.    Marland Kitchen esomeprazole (NEXIUM) 40 MG capsule Take 40 mg by mouth daily as needed.   . ferrous sulfate 325 (65 FE) MG tablet Take 1 tablet (325 mg  total) by mouth 2 (two) times daily with a meal.  . furosemide (LASIX) 40 MG tablet Take 1 tablet (40 mg total) by mouth 2 (two) times daily.  Marland Kitchen glucose blood test strip Check sugars once daily  . hydrOXYzine (ATARAX/VISTARIL) 25 MG tablet Take 1 tablet (25 mg total) by mouth 2 (two) times daily as needed.  . meclizine (ANTIVERT) 25 MG tablet Take 1 tablet (25 mg total) by mouth 3 (three) times daily as needed.  . Multiple Vitamin (MULTIVITAMIN) tablet Take 1 tablet by mouth daily.    . polyethylene glycol powder (GLYCOLAX/MIRALAX) powder Take 17 g by mouth daily as needed.  . pravastatin (PRAVACHOL) 80 MG tablet Take 1 tablet by mouth    every evening  . sodium bicarbonate 650 MG tablet Take 1 tablet (650 mg total) by mouth 3 (three) times daily.  . traMADol (ULTRAM) 50 MG tablet Take 0.5-1 tablets (25-50 mg total) by mouth daily as needed for moderate pain.  Marland Kitchen travoprost, benzalkonium, (TRAVATAN) 0.004 % ophthalmic solution Place 1 drop into both eyes at bedtime.    . traZODone (DESYREL) 50 MG tablet Take 50 mg by mouth at bedtime as needed.   . vitamin E 100 UNIT capsule Take 100 Units by mouth daily.    . Ascorbic Acid (VITAMIN C) 100 MG tablet Take 100 mg by mouth daily.    . diclofenac sodium (  VOLTAREN) 1 % GEL Apply 1 application topically 3 (three) times daily.   No current facility-administered medications on file prior to visit.    Review of Systems Per HPI unless specifically indicated above    Objective:    BP 142/72  Pulse 61  Temp(Src) 98.2 F (36.8 C) (Oral)  Wt 153 lb (69.4 kg)  SpO2 99%  Physical Exam  Nursing note and vitals reviewed. Constitutional: She appears well-developed and well-nourished. No distress.  HENT:  Head: Normocephalic and atraumatic.  Right Ear: External ear normal.  Left Ear: External ear normal.  Nose: Nose normal.  Mouth/Throat: Oropharynx is clear and moist. No oropharyngeal exudate.  Eyes: Conjunctivae and EOM are normal. Pupils are  equal, round, and reactive to light. No scleral icterus.  Neck: Normal range of motion. Neck supple.  Cardiovascular: Normal rate, regular rhythm, normal heart sounds and intact distal pulses.   No murmur heard. Pulmonary/Chest: Effort normal and breath sounds normal. No respiratory distress. She has no wheezes. She has no rales.  Musculoskeletal: She exhibits edema (tr pedal edema).  Diabetic foot exam: Normal inspection No skin breakdown No calluses  Normal DP/PT pulses Normal sensation to light touch and monofilament Nails normal  Lymphadenopathy:    She has no cervical adenopathy.  Skin: Skin is warm and dry. No rash noted.  Psychiatric: She has a normal mood and affect.       Assessment & Plan:   Problem List Items Addressed This Visit   HTN (hypertension)     Chronic, stable. Well controlled on current regimen - continue.    Controlled type 2 diabetes mellitus with diabetic nephropathy - Primary     Reviewed recent labs and cbg's. Congratulated on good control. Did not check A1c today.    Relevant Orders      HM DIABETES FOOT EXAM (Completed)   CKD (chronic kidney disease) stage 5, GFR less than 15 ml/min     New fistula LUE as of last week, still maturing. Establishing with HD center for anticipated upcoming need for dialysis.        Follow up plan: Return in about 6 months (around 09/01/2014), or as needed, for annual exam, prior fasting for blood work.

## 2014-03-02 NOTE — Assessment & Plan Note (Signed)
Reviewed recent labs and cbg's. Congratulated on good control. Did not check A1c today.

## 2014-03-02 NOTE — Telephone Encounter (Signed)
Relevant patient education mailed to patient.  

## 2014-03-02 NOTE — Progress Notes (Signed)
Pre-visit discussion using our clinic review tool. No additional management support is needed unless otherwise documented below in the visit note.  

## 2014-05-11 ENCOUNTER — Telehealth: Payer: Self-pay

## 2014-05-11 NOTE — Telephone Encounter (Signed)
Crystal with [facility] left v/m requesting labs faxed to [phone]. Spoke with Lovey Newcomer at [facility] and request 10/30/13 labs faxed to [phone]. Advised done.

## 2014-05-19 ENCOUNTER — Telehealth: Payer: Self-pay | Admitting: Family Medicine

## 2014-05-19 MED ORDER — TRAMADOL HCL 50 MG PO TABS: 25.0000 mg | ORAL_TABLET | Freq: Every day | ORAL | Status: DC | PRN

## 2014-05-19 NOTE — Telephone Encounter (Signed)
Pt called requesting refill of Tramadol 50 mg.   [facility] [address].

## 2014-05-19 NOTE — Telephone Encounter (Signed)
Rx called in as directed.   

## 2014-05-19 NOTE — Telephone Encounter (Signed)
plz phone in. 

## 2014-06-03 ENCOUNTER — Encounter: Payer: Self-pay | Admitting: Family Medicine

## 2014-06-29 ENCOUNTER — Encounter: Payer: Self-pay | Admitting: Internal Medicine

## 2014-08-01 ENCOUNTER — Other Ambulatory Visit: Payer: Self-pay | Admitting: Family Medicine

## 2014-08-01 DIAGNOSIS — E1121 Type 2 diabetes mellitus with diabetic nephropathy: Secondary | ICD-10-CM

## 2014-08-01 DIAGNOSIS — N185 Chronic kidney disease, stage 5: Secondary | ICD-10-CM

## 2014-08-01 DIAGNOSIS — I10 Essential (primary) hypertension: Secondary | ICD-10-CM

## 2014-08-01 DIAGNOSIS — E559 Vitamin D deficiency, unspecified: Secondary | ICD-10-CM

## 2014-08-01 DIAGNOSIS — E785 Hyperlipidemia, unspecified: Secondary | ICD-10-CM

## 2014-08-03 ENCOUNTER — Other Ambulatory Visit: Payer: 59

## 2014-08-04 ENCOUNTER — Other Ambulatory Visit (INDEPENDENT_AMBULATORY_CARE_PROVIDER_SITE_OTHER): Payer: 59

## 2014-08-04 ENCOUNTER — Telehealth: Payer: Self-pay | Admitting: Radiology

## 2014-08-04 DIAGNOSIS — N185 Chronic kidney disease, stage 5: Secondary | ICD-10-CM

## 2014-08-04 DIAGNOSIS — E1121 Type 2 diabetes mellitus with diabetic nephropathy: Secondary | ICD-10-CM

## 2014-08-04 DIAGNOSIS — E559 Vitamin D deficiency, unspecified: Secondary | ICD-10-CM

## 2014-08-04 DIAGNOSIS — E785 Hyperlipidemia, unspecified: Secondary | ICD-10-CM

## 2014-08-04 LAB — LIPID PANEL
CHOL/HDL RATIO: 2
Cholesterol: 117 mg/dL (ref 0–200)
HDL: 47 mg/dL (ref >39.00)
LDL Cholesterol: 55 mg/dL (ref 0–99)
NONHDL: 70
Triglycerides: 76 mg/dL (ref 0.0–149.0)
VLDL: 15.2 mg/dL (ref 0.0–40.0)

## 2014-08-04 LAB — COMPREHENSIVE METABOLIC PANEL
ALT: 13 U/L (ref 0–35)
AST: 20 U/L (ref 0–37)
Albumin: 3.6 g/dL (ref 3.5–5.2)
Alkaline Phosphatase: 96 U/L (ref 39–117)
BILIRUBIN TOTAL: 0.4 mg/dL (ref 0.2–1.2)
BUN: 47 mg/dL — ABNORMAL HIGH (ref 6–23)
CALCIUM: 9.2 mg/dL (ref 8.4–10.5)
CHLORIDE: 98 meq/L (ref 96–112)
CO2: 31 mEq/L (ref 19–32)
Creatinine, Ser: 5.7 mg/dL (ref 0.4–1.2)
GFR: 9.72 mL/min — CL (ref >60.00)
Glucose, Bld: 79 mg/dL (ref 70–99)
Potassium: 4.4 mEq/L (ref 3.5–5.1)
Sodium: 141 mEq/L (ref 135–145)
Total Protein: 7.5 g/dL (ref 6.0–8.3)

## 2014-08-04 LAB — CBC WITH DIFFERENTIAL/PLATELET
BASOS ABS: 0 10*3/uL (ref 0.0–0.1)
Basophils Relative: 0.5 % (ref 0.0–3.0)
EOS ABS: 0.1 10*3/uL (ref 0.0–0.7)
Eosinophils Relative: 1.7 % (ref 0.0–5.0)
HCT: 35.7 % — ABNORMAL LOW (ref 36.0–46.0)
Hemoglobin: 11.1 g/dL — ABNORMAL LOW (ref 12.0–15.0)
LYMPHS ABS: 1.4 10*3/uL (ref 0.7–4.0)
LYMPHS PCT: 22.7 % (ref 12.0–46.0)
MCHC: 31.2 g/dL (ref 30.0–36.0)
MCV: 90.3 fl (ref 78.0–100.0)
MONOS PCT: 12.6 % — AB (ref 3.0–12.0)
Monocytes Absolute: 0.8 10*3/uL (ref 0.1–1.0)
Neutro Abs: 3.9 10*3/uL (ref 1.4–7.7)
Neutrophils Relative %: 62.5 % (ref 43.0–77.0)
PLATELETS: 198 10*3/uL (ref 150.0–400.0)
RBC: 3.96 Mil/uL (ref 3.87–5.11)
RDW: 16.6 % — AB (ref 11.5–15.5)
WBC: 6.2 10*3/uL (ref 4.0–10.5)

## 2014-08-04 LAB — VITAMIN D 25 HYDROXY (VIT D DEFICIENCY, FRACTURES): VITD: 72.16 ng/mL (ref 30.00–100.00)

## 2014-08-04 LAB — PHOSPHORUS: Phosphorus: 4.4 mg/dL (ref 2.3–4.6)

## 2014-08-04 LAB — HEMOGLOBIN A1C: HEMOGLOBIN A1C: 4.8 % (ref 4.6–6.5)

## 2014-08-04 NOTE — Telephone Encounter (Signed)
Noted. Thanks. Known CKD stage 5 CREAT  Date Value Ref Range Status  10/30/2013 6.79  Final   CREATININE, SER  Date Value Ref Range Status  08/04/2014 5.7* 0.4 - 1.2 mg/dL Final

## 2014-08-04 NOTE — Telephone Encounter (Signed)
[facility] called a critical result, Creatinine 5.7, GFR 9.72. Results given to DR Danise Mina

## 2014-08-10 ENCOUNTER — Encounter: Payer: 59 | Admitting: Family Medicine

## 2014-08-11 ENCOUNTER — Other Ambulatory Visit (HOSPITAL_COMMUNITY)
Admission: RE | Admit: 2014-08-11 | Discharge: 2014-08-11 | Disposition: A | Discharge status: Home / Self Care | Payer: 59 | Source: Ambulatory Visit | Attending: Family Medicine | Admitting: Family Medicine

## 2014-08-11 ENCOUNTER — Ambulatory Visit (INDEPENDENT_AMBULATORY_CARE_PROVIDER_SITE_OTHER): Payer: 59 | Admitting: Family Medicine

## 2014-08-11 ENCOUNTER — Encounter: Payer: Self-pay | Admitting: Family Medicine

## 2014-08-11 VITALS — BP 140/78 | HR 68 | Temp 98.7°F | Ht 63.0 in | Wt 147.2 lb

## 2014-08-11 DIAGNOSIS — Z01419 Encounter for gynecological examination (general) (routine) without abnormal findings: Secondary | ICD-10-CM | POA: Insufficient documentation

## 2014-08-11 DIAGNOSIS — I10 Essential (primary) hypertension: Secondary | ICD-10-CM

## 2014-08-11 DIAGNOSIS — Z992 Dependence on renal dialysis: Secondary | ICD-10-CM

## 2014-08-11 DIAGNOSIS — E1121 Type 2 diabetes mellitus with diabetic nephropathy: Secondary | ICD-10-CM

## 2014-08-11 DIAGNOSIS — Z1151 Encounter for screening for human papillomavirus (HPV): Secondary | ICD-10-CM | POA: Insufficient documentation

## 2014-08-11 DIAGNOSIS — N186 End stage renal disease: Secondary | ICD-10-CM

## 2014-08-11 DIAGNOSIS — E785 Hyperlipidemia, unspecified: Secondary | ICD-10-CM

## 2014-08-11 DIAGNOSIS — Z0000 Encounter for general adult medical examination without abnormal findings: Secondary | ICD-10-CM

## 2014-08-11 DIAGNOSIS — Z124 Encounter for screening for malignant neoplasm of cervix: Secondary | ICD-10-CM

## 2014-08-11 LAB — HM PAP SMEAR: HM Pap smear: NORMAL

## 2014-08-11 NOTE — Assessment & Plan Note (Signed)
Preventative protocols reviewed and updated unless pt declined. Discussed healthy diet and lifestyle.  

## 2014-08-11 NOTE — Assessment & Plan Note (Signed)
Now HD dependent, MWF.

## 2014-08-11 NOTE — Patient Instructions (Addendum)
Good to see you today, return as needed or in 6 months for follow up.  Health Maintenance Adopting a healthy lifestyle and getting preventive care can go a long way to promote health and wellness. Talk with your health care provider about what schedule of regular examinations is right for you. This is a good chance for you to check in with your provider about disease prevention and staying healthy. In between checkups, there are plenty of things you can do on your own. Experts have done a lot of research about which lifestyle changes and preventive measures are most likely to keep you healthy. Ask your health care provider for more information. WEIGHT AND DIET  Eat a healthy diet  Be sure to include plenty of vegetables, fruits, low-fat dairy products, and lean protein.  Do not eat a lot of foods high in solid fats, added sugars, or salt.  Get regular exercise. This is one of the most important things you can do for your health.  Most adults should exercise for at least 150 minutes each week. The exercise should increase your heart rate and make you sweat (moderate-intensity exercise).  Most adults should also do strengthening exercises at least twice a week. This is in addition to the moderate-intensity exercise.  Maintain a healthy weight  Body mass index (BMI) is a measurement that can be used to identify possible weight problems. It estimates body fat based on height and weight. Your health care provider can help determine your BMI and help you achieve or maintain a healthy weight.  For females 32 years of age and older:   A BMI below 18.5 is considered underweight.  A BMI of 18.5 to 24.9 is normal.  A BMI of 25 to 29.9 is considered overweight.  A BMI of 30 and above is considered obese.  Watch levels of cholesterol and blood lipids  You should start having your blood tested for lipids and cholesterol at 62 years of age, then have this test every 5 years.  You may need to  have your cholesterol levels checked more often if:  Your lipid or cholesterol levels are high.  You are older than 62 years of age.  You are at high risk for heart disease.  CANCER SCREENING   Lung Cancer  Lung cancer screening is recommended for adults 54-71 years old who are at high risk for lung cancer because of a history of smoking.  A yearly low-dose CT scan of the lungs is recommended for people who:  Currently smoke.  Have quit within the past 15 years.  Have at least a 30-pack-year history of smoking. A pack year is smoking an average of one pack of cigarettes a day for 1 year.  Yearly screening should continue until it has been 15 years since you quit.  Yearly screening should stop if you develop a health problem that would prevent you from having lung cancer treatment.  Breast Cancer  Practice breast self-awareness. This means understanding how your breasts normally appear and feel.  It also means doing regular breast self-exams. Let your health care provider know about any changes, no matter how small.  If you are in your 20s or 30s, you should have a clinical breast exam (CBE) by a health care provider every 1-3 years as part of a regular health exam.  If you are 9 or older, have a CBE every year. Also consider having a breast X-ray (mammogram) every year.  If you have a family history  of breast cancer, talk to your health care provider about genetic screening.  If you are at high risk for breast cancer, talk to your health care provider about having an MRI and a mammogram every year.  Breast cancer gene (BRCA) assessment is recommended for women who have family members with BRCA-related cancers. BRCA-related cancers include:  Breast.  Ovarian.  Tubal.  Peritoneal cancers.  Results of the assessment will determine the need for genetic counseling and BRCA1 and BRCA2 testing. Cervical Cancer Routine pelvic examinations to screen for cervical cancer  are no longer recommended for nonpregnant women who are considered low risk for cancer of the pelvic organs (ovaries, uterus, and vagina) and who do not have symptoms. A pelvic examination may be necessary if you have symptoms including those associated with pelvic infections. Ask your health care provider if a screening pelvic exam is right for you.   The Pap test is the screening test for cervical cancer for women who are considered at risk.  If you had a hysterectomy for a problem that was not cancer or a condition that could lead to cancer, then you no longer need Pap tests.  If you are older than 65 years, and you have had normal Pap tests for the past 10 years, you no longer need to have Pap tests.  If you have had past treatment for cervical cancer or a condition that could lead to cancer, you need Pap tests and screening for cancer for at least 20 years after your treatment.  If you no longer get a Pap test, assess your risk factors if they change (such as having a new sexual partner). This can affect whether you should start being screened again.  Some women have medical problems that increase their chance of getting cervical cancer. If this is the case for you, your health care provider may recommend more frequent screening and Pap tests.  The human papillomavirus (HPV) test is another test that may be used for cervical cancer screening. The HPV test looks for the virus that can cause cell changes in the cervix. The cells collected during the Pap test can be tested for HPV.  The HPV test can be used to screen women 18 years of age and older. Getting tested for HPV can extend the interval between normal Pap tests from three to five years.  An HPV test also should be used to screen women of any age who have unclear Pap test results.  After 62 years of age, women should have HPV testing as often as Pap tests.  Colorectal Cancer  This type of cancer can be detected and often  prevented.  Routine colorectal cancer screening usually begins at 62 years of age and continues through 62 years of age.  Your health care provider may recommend screening at an earlier age if you have risk factors for colon cancer.  Your health care provider may also recommend using home test kits to check for hidden blood in the stool.  A small camera at the end of a tube can be used to examine your colon directly (sigmoidoscopy or colonoscopy). This is done to check for the earliest forms of colorectal cancer.  Routine screening usually begins at age 74.  Direct examination of the colon should be repeated every 5-10 years through 62 years of age. However, you may need to be screened more often if early forms of precancerous polyps or small growths are found. Skin Cancer  Check your skin from head  to toe regularly.  Tell your health care provider about any new moles or changes in moles, especially if there is a change in a mole's shape or color.  Also tell your health care provider if you have a mole that is larger than the size of a pencil eraser.  Always use sunscreen. Apply sunscreen liberally and repeatedly throughout the day.  Protect yourself by wearing long sleeves, pants, a wide-brimmed hat, and sunglasses whenever you are outside. HEART DISEASE, DIABETES, AND HIGH BLOOD PRESSURE   Have your blood pressure checked at least every 1-2 years. High blood pressure causes heart disease and increases the risk of stroke.  If you are between 26 years and 89 years old, ask your health care provider if you should take aspirin to prevent strokes.  Have regular diabetes screenings. This involves taking a blood sample to check your fasting blood sugar level.  If you are at a normal weight and have a low risk for diabetes, have this test once every three years after 62 years of age.  If you are overweight and have a high risk for diabetes, consider being tested at a younger age or more  often. PREVENTING INFECTION  Hepatitis B  If you have a higher risk for hepatitis B, you should be screened for this virus. You are considered at high risk for hepatitis B if:  You were born in a country where hepatitis B is common. Ask your health care provider which countries are considered high risk.  Your parents were born in a high-risk country, and you have not been immunized against hepatitis B (hepatitis B vaccine).  You have HIV or AIDS.  You use needles to inject street drugs.  You live with someone who has hepatitis B.  You have had sex with someone who has hepatitis B.  You get hemodialysis treatment.  You take certain medicines for conditions, including cancer, organ transplantation, and autoimmune conditions. Hepatitis C  Blood testing is recommended for:  Everyone born from [birth year] through 1964.  Anyone with known risk factors for hepatitis C. Sexually transmitted infections (STIs)  You should be screened for sexually transmitted infections (STIs) including gonorrhea and chlamydia if:  You are sexually active and are younger than 62 years of age.  You are older than 62 years of age and your health care provider tells you that you are at risk for this type of infection.  Your sexual activity has changed since you were last screened and you are at an increased risk for chlamydia or gonorrhea. Ask your health care provider if you are at risk.  If you do not have HIV, but are at risk, it may be recommended that you take a prescription medicine daily to prevent HIV infection. This is called pre-exposure prophylaxis (PrEP). You are considered at risk if:  You are sexually active and do not regularly use condoms or know the HIV status of your partner(s).  You take drugs by injection.  You are sexually active with a partner who has HIV. Talk with your health care provider about whether you are at high risk of being infected with HIV. If you choose to begin PrEP, you  should first be tested for HIV. You should then be tested every 3 months for as long as you are taking PrEP.  PREGNANCY   If you are premenopausal and you may become pregnant, ask your health care provider about preconception counseling.  If you may become pregnant, take 400 to 800 micrograms (  mcg) of folic acid every day.  If you want to prevent pregnancy, talk to your health care provider about birth control (contraception). OSTEOPOROSIS AND MENOPAUSE   Osteoporosis is a disease in which the bones lose minerals and strength with aging. This can result in serious bone fractures. Your risk for osteoporosis can be identified using a bone density scan.  If you are 73 years of age or older, or if you are at risk for osteoporosis and fractures, ask your health care provider if you should be screened.  Ask your health care provider whether you should take a calcium or vitamin D supplement to lower your risk for osteoporosis.  Menopause may have certain physical symptoms and risks.  Hormone replacement therapy may reduce some of these symptoms and risks. Talk to your health care provider about whether hormone replacement therapy is right for you.  HOME CARE INSTRUCTIONS   Schedule regular health, dental, and eye exams.  Stay current with your immunizations.   Do not use any tobacco products including cigarettes, chewing tobacco, or electronic cigarettes.  If you are pregnant, do not drink alcohol.  If you are breastfeeding, limit how much and how often you drink alcohol.  Limit alcohol intake to no more than 1 drink per day for nonpregnant women. One drink equals 12 ounces of beer, 5 ounces of wine, or 1 ounces of hard liquor.  Do not use street drugs.  Do not share needles.  Ask your health care provider for help if you need support or information about quitting drugs.  Tell your health care provider if you often feel depressed.  Tell your health care provider if you have ever  been abused or do not feel safe at home. Document Released: 04/03/2011 Document Revised: 02/02/2014 Document Reviewed: 08/20/2013 [facility] Patient Information 2015 [place], [place]. This information is not intended to replace advice given to you by your health care provider. Make sure you discuss any questions you have with your health care provider.

## 2014-08-11 NOTE — Assessment & Plan Note (Signed)
Chronic, stable on pravastatin. Continue.

## 2014-08-11 NOTE — Progress Notes (Signed)
BP 140/78 mmHg  Pulse 68  Temp(Src) 98.7 F (37.1 C) (Oral)  Ht '5\' 3"'  (1.6 m)  Wt 147 lb 4 oz (66.792 kg)  BMI 26.09 kg/m2   CC: CPE  Subjective:    Patient ID: Kristen Kirk, female    DOB: 02-27-1951, 63 y.o.   MRN: [id number]  HPI: Kristen Kirk is a 63 y.o. female presenting on 08/11/2014 for Annual Exam   Started dialysis at [facility] in [place] 2 mo ago. Schedule MWF 4 hour sessions. 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   Remote smoking use.  Wt Readings from Last 3 Encounters:  08/11/14 147 lb 4 oz (66.792 kg)  03/02/14 153 lb (69.4 kg)  01/15/14 156 lb 12 oz (71.101 kg)   Body mass index is 26.09 kg/(m^2).   Preventative: Colonoscopy - normal 2010 Olevia Perches). rpt 10 yrs Gets flu shot at work  Tetanus 2009.  Pneumovax 2013 Pap normal 07/2011. rpt today Mammogram 11/2013 normal.  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  Allergies and medications reviewed and updated. Current Outpatient Prescriptions on File Prior to Visit  Medication Sig  . amLODipine (NORVASC) 10 MG tablet TAKE 1 TABLET BY MOUTH DAILY  . Ascorbic Acid (VITAMIN C) 100 MG tablet Take 100 mg by mouth daily.    Marland Kitchen aspirin 81 MG tablet Take 81 mg by mouth daily.  . Blood Glucose Monitoring Suppl (ONE TOUCH ULTRA SYSTEM KIT) W/DEVICE KIT 1 kit by Does not apply route once.  . carvedilol (COREG) 12.5 MG tablet Take 1 tablet (12.5 mg total) by mouth 2 (two) times daily with a meal.  . Cholecalciferol (VITAMIN D3) 1000 UNITS CAPS Take 1 capsule by mouth 2 (two) times daily.    . cyclobenzaprine (FLEXERIL) 5 MG tablet Take 1-2 tablets (5-10 mg total) by mouth 2 (two) times daily as needed for muscle spasms.  .  darbepoetin alfa-polysorbate (ARANESP, ALBUMIN FREE,) 60 MCG/ML injection Inject 60 mcg into the skin every 6 (six) weeks. Per renal  . diclofenac sodium (VOLTAREN) 1 % GEL Apply 1 application topically 3 (three) times daily.  . dorzolamide-timolol (COSOPT) 22.3-6.8 MG/ML ophthalmic solution Place 1 drop into both eyes 3 (three) times daily.    Marland Kitchen esomeprazole (NEXIUM) 40 MG capsule Take 40 mg by mouth daily as needed.   . ferrous sulfate 325 (65 FE) MG tablet Take 1 tablet (325 mg total) by mouth 2 (two) times daily with a meal.  . furosemide (LASIX) 40 MG tablet Take 1 tablet (40 mg total) by mouth 2 (two) times daily.  Marland Kitchen glucose blood test strip Check sugars once daily  . hydrOXYzine (ATARAX/VISTARIL) 25 MG tablet Take 1 tablet (25 mg total) by mouth 2 (two) times daily as needed.  . meclizine (ANTIVERT) 25 MG tablet Take 1 tablet (25 mg total) by mouth 3 (three) times daily as needed.  . Multiple Vitamin (MULTIVITAMIN) tablet Take 1 tablet by mouth daily.    . polyethylene glycol powder (GLYCOLAX/MIRALAX) powder Take 17 g by mouth daily as needed.  . pravastatin (PRAVACHOL) 80 MG tablet Take 1 tablet by mouth    every evening  . traMADol (ULTRAM) 50 MG tablet Take 0.5-1 tablets (25-50 mg total) by mouth daily as needed for moderate  pain.  . travoprost, benzalkonium, (TRAVATAN) 0.004 % ophthalmic solution Place 1 drop into both eyes at bedtime.    . traZODone (DESYREL) 50 MG tablet Take 50 mg by mouth at bedtime as needed.   . vitamin E 100 UNIT capsule Take 100 Units by mouth daily.    . sodium bicarbonate 650 MG tablet Take 1 tablet (650 mg total) by mouth 3 (three) times daily.   No current facility-administered medications on file prior to visit.    Review of Systems  Constitutional: Negative for fever, chills, activity change, appetite change, fatigue and unexpected weight change.  HENT: Negative for hearing loss.   Eyes: Negative for visual disturbance.  Respiratory: Positive for  cough (rare, with chest discomfort to cough). Negative for chest tightness, shortness of breath and wheezing.   Cardiovascular: Negative for chest pain, palpitations and leg swelling.  Gastrointestinal: Negative for nausea, vomiting, abdominal pain, diarrhea, constipation, blood in stool and abdominal distention.  Genitourinary: Negative for hematuria and difficulty urinating.  Musculoskeletal: Negative for myalgias, arthralgias and neck pain.  Skin: Negative for rash.  Neurological: Negative for dizziness, seizures, syncope and headaches.  Hematological: Negative for adenopathy. Does not bruise/bleed easily.  Psychiatric/Behavioral: Negative for dysphoric mood. The patient is not nervous/anxious.    Per HPI unless specifically indicated above    Objective:    BP 140/78 mmHg  Pulse 68  Temp(Src) 98.7 F (37.1 C) (Oral)  Ht '5\' 3"'  (1.6 m)  Wt 147 lb 4 oz (66.792 kg)  BMI 26.09 kg/m2  Physical Exam  Constitutional: She is oriented to person, place, and time. She appears well-developed and well-nourished. No distress.  HENT:  Head: Normocephalic and atraumatic.  Right Ear: Hearing, tympanic membrane, external ear and ear canal normal.  Left Ear: Hearing, tympanic membrane, external ear and ear canal normal.  Nose: Nose normal.  Mouth/Throat: Uvula is midline, oropharynx is clear and moist and mucous membranes are normal. No oropharyngeal exudate, posterior oropharyngeal edema or posterior oropharyngeal erythema.  Eyes: Conjunctivae and EOM are normal. Pupils are equal, round, and reactive to light. No scleral icterus.  Neck: Normal range of motion. Neck supple. No thyromegaly present.  Cardiovascular: Normal rate, regular rhythm, normal heart sounds and intact distal pulses.   No murmur heard. Pulses:      Radial pulses are 2+ on the right side, and 2+ on the left side.  Pulmonary/Chest: Effort normal and breath sounds normal. No respiratory distress. She has no wheezes. She has no  rales. Right breast exhibits no inverted nipple, no mass, no nipple discharge, no skin change and no tenderness. Left breast exhibits no inverted nipple, no mass, no nipple discharge, no skin change and no tenderness.  Abdominal: Soft. Bowel sounds are normal. She exhibits no distension and no mass. There is no tenderness. There is no rebound and no guarding.  Genitourinary: Vagina normal and uterus normal. Pelvic exam was performed with patient supine. There is no rash, tenderness or lesion on the right labia. There is no rash, tenderness or lesion on the left labia. Cervix exhibits no motion tenderness, no discharge and no friability. Right adnexum displays no mass, no tenderness and no fullness. Left adnexum displays no mass, no tenderness and no fullness.  Musculoskeletal: Normal range of motion. She exhibits no edema.  Lymphadenopathy:       Head (right side): No submental, no submandibular, no tonsillar, no preauricular and no posterior auricular adenopathy present.       Head (left side): No submental,  no submandibular, no tonsillar, no preauricular and no posterior auricular adenopathy present.    She has no cervical adenopathy.    She has no axillary adenopathy.       Right axillary: No lateral adenopathy present.       Left axillary: No lateral adenopathy present.      Right: No supraclavicular adenopathy present.       Left: No supraclavicular adenopathy present.  Neurological: She is alert and oriented to person, place, and time.  CN grossly intact, station and gait intact  Skin: Skin is warm and dry. No rash noted.  Psychiatric: She has a normal mood and affect. Her behavior is normal. Judgment and thought content normal.  Nursing note and vitals reviewed.  Results for orders placed or performed in visit on 08/04/14  Comprehensive metabolic panel  Result Value Ref Range   Sodium 141 135 - 145 mEq/L   Potassium 4.4 3.5 - 5.1 mEq/L   Chloride 98 96 - 112 mEq/L   CO2 31 19 - 32  mEq/L   Glucose, Bld 79 70 - 99 mg/dL   BUN 47 (H) 6 - 23 mg/dL   Creatinine, Ser 5.7 (HH) 0.4 - 1.2 mg/dL   Total Bilirubin 0.4 0.2 - 1.2 mg/dL   Alkaline Phosphatase 96 39 - 117 U/L   AST 20 0 - 37 U/L   ALT 13 0 - 35 U/L   Total Protein 7.5 6.0 - 8.3 g/dL   Albumin 3.6 3.5 - 5.2 g/dL   Calcium 9.2 8.4 - 10.5 mg/dL   GFR 9.72 (LL) >60.00 mL/min  Lipid panel  Result Value Ref Range   Cholesterol 117 0 - 200 mg/dL   Triglycerides 76.0 0.0 - 149.0 mg/dL   HDL 47.00 >39.00 mg/dL   VLDL 15.2 0.0 - 40.0 mg/dL   LDL Cholesterol 55 0 - 99 mg/dL   Total CHOL/HDL Ratio 2    NonHDL 70.00   Hemoglobin A1c  Result Value Ref Range   Hgb A1c MFr Bld 4.8 4.6 - 6.5 %  CBC with Differential  Result Value Ref Range   WBC 6.2 4.0 - 10.5 K/uL   RBC 3.96 3.87 - 5.11 Mil/uL   Hemoglobin 11.1 (L) 12.0 - 15.0 g/dL   HCT 35.7 (L) 36.0 - 46.0 %   MCV 90.3 78.0 - 100.0 fl   MCHC 31.2 30.0 - 36.0 g/dL   RDW 16.6 (H) 11.5 - 15.5 %   Platelets 198.0 150.0 - 400.0 K/uL   Neutrophils Relative % 62.5 43.0 - 77.0 %   Lymphocytes Relative 22.7 12.0 - 46.0 %   Monocytes Relative 12.6 (H) 3.0 - 12.0 %   Eosinophils Relative 1.7 0.0 - 5.0 %   Basophils Relative 0.5 0.0 - 3.0 %   Neutro Abs 3.9 1.4 - 7.7 K/uL   Lymphs Abs 1.4 0.7 - 4.0 K/uL   Monocytes Absolute 0.8 0.1 - 1.0 K/uL   Eosinophils Absolute 0.1 0.0 - 0.7 K/uL   Basophils Absolute 0.0 0.0 - 0.1 K/uL  Phosphorus  Result Value Ref Range   Phosphorus 4.4 2.3 - 4.6 mg/dL  Vit D  25 hydroxy (rtn osteoporosis monitoring)  Result Value Ref Range   VITD 72.16 30.00 - 100.00 ng/mL      Assessment & Plan:   Problem List Items Addressed This Visit    HTN (hypertension)    Chronic, mildly elevated today. No changes made.    HLD (hyperlipidemia)    Chronic, stable on pravastatin.  Continue.    Healthcare maintenance - Primary    Preventative protocols reviewed and updated unless pt declined. Discussed healthy diet and lifestyle.     ESRD (end  stage renal disease) on dialysis    Now HD dependent, MWF.    Controlled type 2 diabetes mellitus with diabetic nephropathy    Diabetes has resolved with weight loss and since starting dialysis.     Other Visit Diagnoses    Pap smear for cervical cancer screening        Relevant Orders       Cytology - PAP        Follow up plan: Return in about 6 months (around 02/09/2015), or as needed, for follow up visit.

## 2014-08-11 NOTE — Assessment & Plan Note (Signed)
Chronic, mildly elevated today. No changes made.  

## 2014-08-11 NOTE — Assessment & Plan Note (Signed)
Diabetes has resolved with weight loss and since starting dialysis.

## 2014-08-11 NOTE — Progress Notes (Signed)
Pre visit review using our clinic review tool, if applicable. No additional management support is needed unless otherwise documented below in the visit note. 

## 2014-08-12 LAB — CYTOLOGY - PAP

## 2014-08-14 ENCOUNTER — Encounter: Payer: Self-pay | Admitting: *Deleted

## 2014-09-10 ENCOUNTER — Other Ambulatory Visit: Payer: Self-pay | Admitting: Family Medicine

## 2014-10-06 ENCOUNTER — Other Ambulatory Visit: Payer: Self-pay | Admitting: Family Medicine

## 2014-10-06 NOTE — Telephone Encounter (Signed)
Ok to refill 

## 2014-10-07 NOTE — Telephone Encounter (Signed)
Rx called in as directed.   

## 2014-10-07 NOTE — Telephone Encounter (Signed)
plz phone in. 

## 2014-10-13 ENCOUNTER — Other Ambulatory Visit: Payer: Self-pay | Admitting: *Deleted

## 2014-10-13 NOTE — Telephone Encounter (Signed)
Pt thinks she's having vertigo, last time med filled was 2013, please advise

## 2014-10-14 MED ORDER — MECLIZINE HCL 25 MG PO TABS: 25.0000 mg | ORAL_TABLET | Freq: Three times a day (TID) | ORAL | Status: DC | PRN

## 2014-10-27 ENCOUNTER — Ambulatory Visit (INDEPENDENT_AMBULATORY_CARE_PROVIDER_SITE_OTHER): Payer: 59 | Admitting: Family Medicine

## 2014-10-27 ENCOUNTER — Encounter: Payer: Self-pay | Admitting: Family Medicine

## 2014-10-27 VITALS — BP 112/64 | HR 68 | Temp 98.3°F | Wt 146.0 lb

## 2014-10-27 DIAGNOSIS — Z992 Dependence on renal dialysis: Secondary | ICD-10-CM

## 2014-10-27 DIAGNOSIS — R42 Dizziness and giddiness: Secondary | ICD-10-CM

## 2014-10-27 DIAGNOSIS — N186 End stage renal disease: Secondary | ICD-10-CM

## 2014-10-27 DIAGNOSIS — K219 Gastro-esophageal reflux disease without esophagitis: Secondary | ICD-10-CM

## 2014-10-27 NOTE — Patient Instructions (Addendum)
May take simethicone or gasX.  Exclude gas producing foods (beans, onions, celery, carrots, raisins, bananas, apricots, prunes, brussel sprouts, wheat germ, pretzels) Increase nexium to one tablet daily every day. For vertigo - ok to continue meclizine. Pass by Marion's office for referral to neurologist for further evaluation of worsening vertigo episodes

## 2014-10-27 NOTE — Progress Notes (Signed)
Pre visit review using our clinic review tool, if applicable. No additional management support is needed unless otherwise documented below in the visit note. 

## 2014-10-27 NOTE — Assessment & Plan Note (Signed)
Encouraged she start nexium 40mg  once daily every day.

## 2014-10-27 NOTE — Assessment & Plan Note (Signed)
Continue HD MWF.

## 2014-10-27 NOTE — Progress Notes (Signed)
BP 112/64 mmHg  Pulse 68  Temp(Src) 98.3 F (36.8 C) (Oral)  Wt 146 lb (66.225 kg)   CC: vertigo worsening  Subjective:    Patient ID: Kristen Kirk, female    DOB: 1950-11-22, 64 y.o.   MRN: [id number]  HPI: Kristen Kirk is a 64 y.o. female presenting on 10/27/2014 for Dizziness   Worsening vertigo recently, affecting ability to go to work. Present during holiday season as we as again last week. Describes room spinning sensation and feeling off balance. Improves with closing of eyes. Episodes last days. + nausea/vomiting.  Random onset.   No recent fevers/chills, recent viral URI, significant sinus congestion, denies changes to hearing, denies ringing in ears. Not worse with head tilt in extension. No migraines or headaches.   Has tried meclizine which may help some but not consistently.  Also noticing worsening gas and heartburn regardless of diet.   ESRD HD dependent - MWF. Followed by Dr Mathis Fare at [facility]. 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  Relevant past medical, surgical, family and social history reviewed and updated as indicated. Interim medical history since our last visit reviewed. Allergies and medications reviewed and updated. Current Outpatient Prescriptions on File Prior to Visit  Medication Sig  . amLODipine (NORVASC) 10 MG tablet TAKE 1 TABLET BY MOUTH DAILY  . aspirin 81 MG tablet Take 81 mg by mouth daily.  . Blood Glucose Monitoring Suppl (ONE TOUCH ULTRA SYSTEM KIT) W/DEVICE KIT 1 kit by Does not apply route once.  . carvedilol (COREG) 12.5 MG tablet Take 1 tablet (12.5 mg total) by mouth 2 (two) times daily with a meal.  . Cholecalciferol (VITAMIN D3) 1000 UNITS CAPS Take 1 capsule by mouth 2 (two) times daily.    . cyclobenzaprine (FLEXERIL) 5 MG tablet Take 1-2 tablets (5-10 mg total) by mouth 2 (two) times daily as needed for muscle spasms.  . diclofenac sodium (VOLTAREN) 1  % GEL Apply 1 application topically 3 (three) times daily.  . dorzolamide-timolol (COSOPT) 22.3-6.8 MG/ML ophthalmic solution Place 1 drop into both eyes 3 (three) times daily.    Marland Kitchen esomeprazole (NEXIUM) 40 MG capsule Take 40 mg by mouth daily.   . furosemide (LASIX) 40 MG tablet Take 1 tablet (40 mg total) by mouth 2 (two) times daily.  Marland Kitchen glucose blood test strip Check sugars once daily  . hydrOXYzine (ATARAX/VISTARIL) 25 MG tablet Take 1 tablet (25 mg total) by mouth 2 (two) times daily as needed.  . meclizine (ANTIVERT) 25 MG tablet Take 1 tablet (25 mg total) by mouth 3 (three) times daily as needed.  . Multiple Vitamin (MULTIVITAMIN) tablet Take 1 tablet by mouth daily.    . polyethylene glycol powder (GLYCOLAX/MIRALAX) powder Take 17 g by mouth daily as needed.  . pravastatin (PRAVACHOL) 80 MG tablet Take 1 tablet by mouth  every evening  . traMADol (ULTRAM) 50 MG tablet TAKE 1/2 TO 1 TABLET EVERY DAY AS NEEDED  . travoprost, benzalkonium, (TRAVATAN) 0.004 % ophthalmic solution Place 1 drop into both eyes at bedtime.    . traZODone (DESYREL) 50 MG tablet Take 50 mg by mouth at bedtime as needed.   . vitamin E 100 UNIT capsule Take 400 Units by mouth daily.   . Ascorbic Acid (VITAMIN C) 100 MG tablet Take 100 mg by mouth daily.    . darbepoetin alfa-polysorbate (ARANESP, ALBUMIN FREE,) 60 MCG/ML injection Inject 60  mcg into the skin every 6 (six) weeks. Per renal  . sodium bicarbonate 650 MG tablet Take 1 tablet (650 mg total) by mouth 3 (three) times daily. (Patient not taking: Reported on 10/27/2014)   No current facility-administered medications on file prior to visit.   Past Medical History  Diagnosis Date  . CKD (chronic kidney disease) stage 5, GFR less than 15 ml/min     per [facility] renal (Dr. Alfonse Alpers) considering transplant, referred for HD access and initiation  . Diabetes mellitus type II     currently diet controlled since losing weight  . HLD (hyperlipidemia)   . GERD  (gastroesophageal reflux disease)   . HTN (hypertension) pre-2003  . Urge incontinence     Dr. Matilde Sprang to do PTNS  . Vitamin D deficiency   . Glaucoma     L>R  . DJD (degenerative joint disease)   . Osteoarthritis   . Vaginal atrophy     vagifem caused spotting, normal TVS  . Anemia in chronic kidney disease     s/p aranesp 11/2013 now starting epo with HD 06/2014    Review of Systems Per HPI unless specifically indicated above     Objective:    BP 112/64 mmHg  Pulse 68  Temp(Src) 98.3 F (36.8 C) (Oral)  Wt 146 lb (66.225 kg)  Wt Readings from Last 3 Encounters:  10/27/14 146 lb (66.225 kg)  08/11/14 147 lb 4 oz (66.792 kg)  03/02/14 153 lb (69.4 kg)    Physical Exam  Constitutional: She is oriented to person, place, and time. She appears well-developed and well-nourished. No distress.  HENT:  Head: Normocephalic and atraumatic.  Mouth/Throat: Oropharynx is clear and moist. No oropharyngeal exudate.  Eyes: Conjunctivae and EOM are normal. Pupils are equal, round, and reactive to light. No scleral icterus.  Minimal horizontal nystagmus with bilateral lateral gaze   Neck: Normal range of motion. Neck supple. No thyromegaly present.  Cardiovascular: Normal rate, regular rhythm, normal heart sounds and intact distal pulses.   No murmur heard. Venous hum throughout from L forearm access for HD  Pulmonary/Chest: Effort normal and breath sounds normal. No respiratory distress. She has no wheezes. She has no rales.  Musculoskeletal: She exhibits no edema.  Lymphadenopathy:    She has no cervical adenopathy.  Neurological: She is alert and oriented to person, place, and time. She has normal strength. No cranial nerve deficit or sensory deficit. She displays a negative Romberg sign. Coordination and gait normal.  CN 2-12 intact Station and gait intact FTN intact  Skin: Skin is warm and dry. No rash noted.  Psychiatric: She has a normal mood and affect.  Nursing note and  vitals reviewed.  Results for orders placed or performed in visit on 08/14/14  HM PAP SMEAR  Result Value Ref Range   HM Pap smear Normal-Repeat 3 years   orthostatics negative today.    Assessment & Plan:   Problem List Items Addressed This Visit    VERTIGO - Primary    Pt describes worsening prolonged episodes of vertigo that last a few days, not improved with meclizine. Not consistent with typical peripheral causes of vertigo, exam not consistent with central cause (but pt also not currently having episode). Not consistent with BPV, labrynthitis or vestibular neuritis, meniere's. ?vertiginous migraine without the headache. Significant comorbidities. Would like neuro input prior to ordering head imaging. Pt agrees with plan. Will refer to neuro for further evaluation.      Relevant Orders  Ambulatory referral to Neurology   GERD (gastroesophageal reflux disease)    Encouraged she start nexium 59m once daily every day.      Relevant Medications   sevelamer carbonate (RENVELA) 800 MG tablet   ESRD (end stage renal disease) on dialysis    Continue HD MWF.          Follow up plan: Return if symptoms worsen or fail to improve.

## 2014-10-27 NOTE — Assessment & Plan Note (Signed)
Pt describes worsening prolonged episodes of vertigo that last a few days, not improved with meclizine. Not consistent with typical peripheral causes of vertigo, exam not consistent with central cause (but pt also not currently having episode). Not consistent with BPV, labrynthitis or vestibular neuritis, meniere's. ?vertiginous migraine without the headache. Significant comorbidities. Would like neuro input prior to ordering head imaging. Pt agrees with plan. Will refer to neuro for further evaluation.

## 2014-11-05 ENCOUNTER — Encounter: Payer: Self-pay | Admitting: Neurology

## 2014-11-05 ENCOUNTER — Ambulatory Visit (INDEPENDENT_AMBULATORY_CARE_PROVIDER_SITE_OTHER): Payer: 59 | Admitting: Neurology

## 2014-11-05 VITALS — BP 106/58 | HR 70 | Temp 98.2°F | Resp 18 | Ht 63.0 in | Wt 147.0 lb

## 2014-11-05 DIAGNOSIS — R42 Dizziness and giddiness: Secondary | ICD-10-CM

## 2014-11-05 NOTE — Progress Notes (Signed)
NEUROLOGY CONSULTATION NOTE  Kristen Kirk MRN: [id number] DOB: 1951/03/21  Referring provider: Dr. Danise Mina Primary care provider: Dr. Danise Mina  Reason for consult:  vertigo  HISTORY OF PRESENT ILLNESS: Kristen Kirk is a 64 year old right-handed woman with ESRD on dialysis (M,W,F), GERD, who presents for dizziness.  Records and labs reviewed.  She has history of vertigo, usually occuring twice a year.  However her current episode has been worse and more persistent.  It started around Christmas.  She has spontaneous onset of spinning sensation associated with nausea and vomiting.  There is no associated headache, hearing loss, tinnitus, double vision, focal numbness or weakness.  It lasts all day.  It occurs once or twice a week.  It is not positional.  She has not had any falls.  In between attacks, she feels fine.  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 She takes meclizine with variable effect.  Renal function from 08/04/14 showed Hgb A1c 4.8, LDL 55, BUN 47, Cr 5.7 and GFR 9.72, which is baseline. PAST MEDICAL HISTORY: Past Medical History  Diagnosis Date  . CKD (chronic kidney disease) stage 5, GFR less than 15 ml/min     per [facility] renal (Dr. Alfonse Alpers) considering transplant, referred for HD access and initiation  . Diabetes mellitus type II     currently diet controlled since losing weight  . HLD (hyperlipidemia)   . GERD (gastroesophageal reflux disease)   . HTN (hypertension) pre-2003  . Urge incontinence     Dr. Matilde Sprang to do PTNS  . Vitamin D deficiency   . Glaucoma     L>R  . DJD (degenerative joint disease)   . Osteoarthritis   . Vaginal atrophy     vagifem caused spotting, normal TVS  . Anemia in chronic kidney disease     s/p aranesp 11/2013 now starting epo with HD 06/2014    PAST SURGICAL HISTORY: Past Surgical History  Procedure Laterality Date  . Tubal ligation  1990s    bilateral  . Bladder suspension  2011    for stress incontinence-Dr.  MacDiarmid  . Refractive surgery    . Left eye laser surgery  06/2011  . Colonoscopy  02/04/2009    normal, rec rpt 10 yrs    MEDICATIONS: Current Outpatient Prescriptions on File Prior to Visit  Medication Sig Dispense Refill  . amLODipine (NORVASC) 10 MG tablet TAKE 1 TABLET BY MOUTH DAILY 30 tablet 5  . Ascorbic Acid (VITAMIN C) 100 MG tablet Take 100 mg by mouth daily.      Marland Kitchen aspirin 81 MG tablet Take 81 mg by mouth daily.    . Blood Glucose Monitoring Suppl (ONE TOUCH ULTRA SYSTEM KIT) W/DEVICE KIT 1 kit by Does not apply route once. 1 each 0  . carvedilol (COREG) 12.5 MG tablet Take 1 tablet (12.5 mg total) by mouth 2 (two) times daily with a meal.    . Cholecalciferol (VITAMIN D3) 1000 UNITS CAPS Take 1 capsule by mouth 2 (two) times daily.      . cinacalcet (SENSIPAR) 30 MG tablet Take 30 mg by mouth daily.    . cyclobenzaprine (FLEXERIL) 5 MG tablet Take 1-2 tablets (5-10 mg total) by mouth 2 (two) times daily as needed for muscle spasms. 40 tablet 0  . diclofenac sodium (VOLTAREN) 1 % GEL Apply 1 application topically 3 (three) times daily. 1 Tube 1  . dorzolamide-timolol (COSOPT) 22.3-6.8 MG/ML ophthalmic solution Place 1 drop into both eyes 3 (three) times  daily.      . esomeprazole (NEXIUM) 40 MG capsule Take 40 mg by mouth daily.     . furosemide (LASIX) 40 MG tablet Take 1 tablet (40 mg total) by mouth 2 (two) times daily.    Marland Kitchen glucose blood test strip Check sugars once daily 100 each 11  . hydrOXYzine (ATARAX/VISTARIL) 25 MG tablet Take 1 tablet (25 mg total) by mouth 2 (two) times daily as needed.    . meclizine (ANTIVERT) 25 MG tablet Take 1 tablet (25 mg total) by mouth 3 (three) times daily as needed. 30 tablet 1  . Multiple Vitamin (MULTIVITAMIN) tablet Take 1 tablet by mouth daily.      . polyethylene glycol powder (GLYCOLAX/MIRALAX) powder Take 17 g by mouth daily as needed. 3350 g 1  . pravastatin (PRAVACHOL) 80 MG tablet Take 1 tablet by mouth  every evening 90  tablet 2  . sevelamer carbonate (RENVELA) 800 MG tablet Take 800 mg by mouth 3 (three) times daily with meals.    . sodium bicarbonate 650 MG tablet Take 1 tablet (650 mg total) by mouth 3 (three) times daily.    . traMADol (ULTRAM) 50 MG tablet TAKE 1/2 TO 1 TABLET EVERY DAY AS NEEDED 30 tablet 0  . travoprost, benzalkonium, (TRAVATAN) 0.004 % ophthalmic solution Place 1 drop into both eyes at bedtime.      . traZODone (DESYREL) 50 MG tablet Take 50 mg by mouth at bedtime as needed.     . vitamin E 100 UNIT capsule Take 400 Units by mouth daily.     . darbepoetin alfa-polysorbate (ARANESP, ALBUMIN FREE,) 60 MCG/ML injection Inject 60 mcg into the skin every 6 (six) weeks. Per renal     No current facility-administered medications on file prior to visit.    ALLERGIES: Allergies  Allergen Reactions  . Brimonidine     Other reaction(s): Other (See Comments) eye redness  . Shellfish-Derived Products Swelling    FAMILY HISTORY: Family History  Problem Relation Age of Onset  . Stroke Brother   . Hyperlipidemia Brother   . Hypertension Brother   . Hyperlipidemia Brother   . Hypertension Brother   . Hyperlipidemia Sister   . Hypertension Sister   . Hyperlipidemia Sister   . Hypertension Sister   . Diabetes Mother   . Heart failure Mother   . Heart failure Maternal Grandfather     SOCIAL HISTORY: History   Social History  . Marital Status: Married    Spouse Name: N/A    Number of Children: 1  . Years of Education: N/A   Occupational History  . [employer]-lab assistant   .     Social History Main Topics  . Smoking status: Former Research scientist (life sciences)  . Smokeless tobacco: Never Used     Comment: Remote  . Alcohol Use: No  . Drug Use: No  . Sexual Activity:    Partners: Male   Other Topics Concern  . 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[employer], 3rd shift   Activity:  no regular exercise - goes to gym   Diet: good water, fruits/vegetables daily    REVIEW OF SYSTEMS: Constitutional: No fevers, chills, or sweats, no generalized fatigue, change in appetite Eyes: No visual changes, double vision, eye pain Ear, nose and throat: No hearing loss, ear pain, nasal congestion, sore throat Cardiovascular: No chest  pain, palpitations Respiratory:  No shortness of breath at rest or with exertion, wheezes GastrointestinaI: No nausea, vomiting, diarrhea, abdominal pain, fecal incontinence Genitourinary:  No dysuria, urinary retention or frequency Musculoskeletal:  No neck pain, back pain Integumentary: No rash, pruritus, skin lesions Neurological: as above Psychiatric: No depression, insomnia, anxiety Endocrine: No palpitations, fatigue, diaphoresis, mood swings, change in appetite, change in weight, increased thirst Hematologic/Lymphatic:  No anemia, purpura, petechiae. Allergic/Immunologic: no itchy/runny eyes, nasal congestion, recent allergic reactions, rashes  PHYSICAL EXAM: Filed Vitals:   11/05/14 1004  BP: 106/58  Pulse: 70  Temp: 98.2 F (36.8 C)  Resp: 18   General: No acute distress Head:  Normocephalic/atraumatic Eyes:  fundi unremarkable, without vessel changes, exudates, hemorrhages or papilledema. Neck: supple, no paraspinal tenderness, full range of motion Back: No paraspinal tenderness Heart: regular rate and rhythm Lungs: Clear to auscultation bilaterally. Vascular: No carotid bruits. Neurological Exam: Mental status: alert and oriented to person, place, and time, recent and remote memory intact, fund of knowledge intact, attention and concentration intact, speech fluent and not dysarthric, language intact. Cranial nerves: CN I: not tested CN II: pupils equal, round and reactive to light, visual fields intact, fundi unremarkable, without vessel changes, exudates, hemorrhages or papilledema. CN III, IV, VI:  full range of motion, no  nystagmus, no ptosis CN V: facial sensation intact CN VII: upper and lower face symmetric CN VIII: hearing intact CN IX, X: gag intact, uvula midline CN XI: sternocleidomastoid and trapezius muscles intact CN XII: tongue midline Bulk & Tone: normal, no fasciculations. Motor:  5/5 throughout Sensation:  Temperature and vibration intact Deep Tendon Reflexes:  2+ throughout, toes downgoing Finger to nose testing:  No dysmetria Heel to shin:  No dysmetria Gait:  Normal station and stride.  Able to turn.  Some trouble with tandem walking. Romberg negative.  IMPRESSION: Recurrent vertigo.  Not classic benign paroxysmal vertigo.  Vestibular neuritis possible but not typical to be so episodic.  PLAN: 1.  Would get MRI of the brain 2.  If unremarkable, would recommend ENT evaluation   Thank you for allowing me to take part in the care of this patient.  Metta Clines, DO  CC: Ria Bush, MD

## 2014-11-05 NOTE — Patient Instructions (Addendum)
Etiology of the dizziness is unclear.  We will check MRI of the brain to look for any abnormalities [facility] 11/24/14 at 11:45 am .  I will let you know the results and if any other testing is necessary.  Otherwise, I would suggest seeing an ear doctor

## 2014-11-24 ENCOUNTER — Ambulatory Visit (HOSPITAL_COMMUNITY): Payer: 59

## 2014-11-26 ENCOUNTER — Ambulatory Visit: Payer: 59 | Admitting: Neurology

## 2014-11-28 ENCOUNTER — Ambulatory Visit
Admission: RE | Admit: 2014-11-28 | Discharge: 2014-11-28 | Disposition: A | Discharge status: Home / Self Care | Payer: 59 | Source: Ambulatory Visit | Attending: Neurology | Admitting: Neurology

## 2014-11-28 DIAGNOSIS — R42 Dizziness and giddiness: Secondary | ICD-10-CM

## 2014-11-30 ENCOUNTER — Telehealth: Payer: Self-pay | Admitting: *Deleted

## 2014-11-30 ENCOUNTER — Telehealth: Payer: Self-pay | Admitting: Family Medicine

## 2014-11-30 NOTE — Telephone Encounter (Signed)
Pt called to say she will be dropping off FMLA papers to be filled out

## 2014-11-30 NOTE — Telephone Encounter (Signed)
Patient is aware of normal MRI and advised to see ENT for the vertigo

## 2014-12-01 NOTE — Telephone Encounter (Signed)
Pt dropped off FMLA paperwork to be completed. She would like it faxed and then call her to pick up the copy. Will put inbox. Thank you.

## 2014-12-01 NOTE — Telephone Encounter (Signed)
FMLA paperwork in you INbox for review, completion and signature.

## 2014-12-03 ENCOUNTER — Telehealth: Payer: Self-pay | Admitting: Family Medicine

## 2014-12-03 DIAGNOSIS — R42 Dizziness and giddiness: Secondary | ICD-10-CM

## 2014-12-03 DIAGNOSIS — N186 End stage renal disease: Secondary | ICD-10-CM

## 2014-12-03 DIAGNOSIS — Z992 Dependence on renal dialysis: Secondary | ICD-10-CM

## 2014-12-03 NOTE — Telephone Encounter (Signed)
Pt needs a referral to an ENT. Would like to go to [facility] E[facility] Dr Pryor Ochoa.

## 2014-12-04 DIAGNOSIS — Z7689 Persons encountering health services in other specified circumstances: Secondary | ICD-10-CM

## 2014-12-05 NOTE — Telephone Encounter (Addendum)
Filled and in Kim[place]' box. Would clarify with patient - can we add kidney disease and dialysis dx onto FMLA forms?

## 2014-12-05 NOTE — Telephone Encounter (Signed)
referral placed

## 2014-12-05 NOTE — Addendum Note (Signed)
Addended by: Ria Bush on: 12/05/2014 12:44 PM   Modules accepted: Orders

## 2014-12-07 NOTE — Telephone Encounter (Signed)
Left message with pt's spouse to call back. Pt was at dialysis this morning.

## 2014-12-09 NOTE — Telephone Encounter (Signed)
Faxed referral to [facility] 12/09/14.

## 2014-12-09 NOTE — Telephone Encounter (Signed)
Spoke to pt: she does not want dx of dialysis or kidney disease.  Faxed ppw 12/07/14  Copy mailed to pt. Copy for my file, copy to scan and copy to charge.

## 2014-12-11 NOTE — Telephone Encounter (Signed)
Pt scheduled at [facility] with Dr. Pryor Ochoa on 12/23/14 @ 2:30pm.  LVM for pt to call back and confirm she received my message about when this appt is scheduled.

## 2015-01-14 ENCOUNTER — Encounter: Payer: Self-pay | Admitting: Family Medicine

## 2015-01-14 ENCOUNTER — Ambulatory Visit (INDEPENDENT_AMBULATORY_CARE_PROVIDER_SITE_OTHER): Payer: 59 | Admitting: Family Medicine

## 2015-01-14 VITALS — BP 110/74 | HR 66 | Temp 98.7°F | Wt 144.8 lb

## 2015-01-14 DIAGNOSIS — Z992 Dependence on renal dialysis: Secondary | ICD-10-CM

## 2015-01-14 DIAGNOSIS — E1121 Type 2 diabetes mellitus with diabetic nephropathy: Secondary | ICD-10-CM

## 2015-01-14 DIAGNOSIS — N186 End stage renal disease: Secondary | ICD-10-CM

## 2015-01-14 DIAGNOSIS — J209 Acute bronchitis, unspecified: Secondary | ICD-10-CM

## 2015-01-14 MED ORDER — PREDNISONE 20 MG PO TABS: ORAL_TABLET | ORAL | Status: DC

## 2015-01-14 MED ORDER — AZITHROMYCIN 250 MG PO TABS: ORAL_TABLET | ORAL | Status: DC

## 2015-01-14 MED ORDER — BENZONATATE 100 MG PO CAPS: 100.0000 mg | ORAL_CAPSULE | Freq: Two times a day (BID) | ORAL | Status: DC | PRN

## 2015-01-14 NOTE — Assessment & Plan Note (Signed)
Treat aggressively with zpack and prednisone course given comorbidities.  Tessalon perls for cough. Supportive care as per instructions.  Red flags to return discussed.

## 2015-01-14 NOTE — Assessment & Plan Note (Signed)
Doing well on HD MWF.

## 2015-01-14 NOTE — Patient Instructions (Addendum)
I do think you have bronchitis. Treat with zpack and prednisone course. May use tessalon perls as needed for suppression of cough. Let us know if not improving as expected or if any worsening seek care over the weekend.

## 2015-01-14 NOTE — Progress Notes (Signed)
Pre visit review using our clinic review tool, if applicable. No additional management support is needed unless otherwise documented below in the visit note. 

## 2015-01-14 NOTE — Assessment & Plan Note (Signed)
Will need to monitor sugars while on prednisone.

## 2015-01-14 NOTE — Progress Notes (Signed)
BP 110/74 mmHg  Pulse 66  Temp(Src) 98.7 F (37.1 C) (Oral)  Wt 144 lb 12.8 oz (65.681 kg)  SpO2 97%   CC: congested x 2 days  Subjective:    Patient ID: Kristen Kirk, female    DOB: 01-30-1951, 64 y.o.   MRN: [id number]  HPI: Kristen Kirk is a 64 y.o. female presenting on 01/14/2015 for Cough   2d h/o chest > head congestion with cough productive of mucous.   No fevers/chills, ear or tooth pain, PDrainage, HA or ST.  No sick contacts at home. No smokers at home.  So far hasn't tried anything for this other than tylenol. Doesn't want to miss work. Worked through [employer] last night.   CKD stage 5 followed by Dr Alfonse Alpers [facility] now on HD MWF.  Relevant past medical, surgical, family and social history reviewed and updated as indicated. Interim medical history since our last visit reviewed. Allergies and medications reviewed and updated. Current Outpatient Prescriptions on File Prior to Visit  Medication Sig  . amLODipine (NORVASC) 10 MG tablet TAKE 1 TABLET BY MOUTH DAILY  . Ascorbic Acid (VITAMIN C) 100 MG tablet Take 100 mg by mouth daily.    Marland Kitchen aspirin 81 MG tablet Take 81 mg by mouth daily.  . Blood Glucose Monitoring Suppl (ONE TOUCH ULTRA SYSTEM KIT) W/DEVICE KIT 1 kit by Does not apply route once.  . carvedilol (COREG) 12.5 MG tablet Take 1 tablet (12.5 mg total) by mouth 2 (two) times daily with a meal.  . carvedilol (COREG) 12.5 MG tablet TAKE 1 TABLET (12.5 MG TOTAL) BY MOUTH TWO (2) TIMES A DAY.  Marland Kitchen Cholecalciferol (VITAMIN D3) 1000 UNITS CAPS Take 1 capsule by mouth 2 (two) times daily.    . cinacalcet (SENSIPAR) 30 MG tablet Take 30 mg by mouth daily.  . cyclobenzaprine (FLEXERIL) 5 MG tablet Take 1-2 tablets (5-10 mg total) by mouth 2 (two) times daily as needed for muscle spasms.  . darbepoetin alfa-polysorbate (ARANESP, ALBUMIN FREE,) 60 MCG/ML injection Inject 60 mcg into the skin every 6 (six) weeks. Per renal  . diclofenac sodium (VOLTAREN) 1 % GEL Apply 1  application topically 3 (three) times daily.  . dorzolamide-timolol (COSOPT) 22.3-6.8 MG/ML ophthalmic solution Place 1 drop into both eyes 3 (three) times daily.    Marland Kitchen esomeprazole (NEXIUM) 40 MG capsule Take 40 mg by mouth daily.   . furosemide (LASIX) 40 MG tablet Take 1 tablet (40 mg total) by mouth 2 (two) times daily.  Marland Kitchen glucose blood test strip Check sugars once daily  . hydrOXYzine (ATARAX/VISTARIL) 25 MG tablet Take 1 tablet (25 mg total) by mouth 2 (two) times daily as needed.  . meclizine (ANTIVERT) 25 MG tablet Take 1 tablet (25 mg total) by mouth 3 (three) times daily as needed.  . Multiple Vitamin (MULTIVITAMIN) tablet Take 1 tablet by mouth daily.    . polyethylene glycol powder (GLYCOLAX/MIRALAX) powder Take 17 g by mouth daily as needed.  . pravastatin (PRAVACHOL) 80 MG tablet Take 1 tablet by mouth  every evening  . sevelamer carbonate (RENVELA) 800 MG tablet Take 800 mg by mouth 3 (three) times daily with meals.  . sodium bicarbonate 650 MG tablet Take 1 tablet (650 mg total) by mouth 3 (three) times daily.  . traMADol (ULTRAM) 50 MG tablet TAKE 1/2 TO 1 TABLET EVERY DAY AS NEEDED  . TRAVATAN Z 0.004 % SOLN ophthalmic solution   . travoprost, benzalkonium, (TRAVATAN) 0.004 % ophthalmic  solution Place 1 drop into both eyes at bedtime.    . traZODone (DESYREL) 50 MG tablet Take 50 mg by mouth at bedtime as needed.   . vitamin E 100 UNIT capsule Take 400 Units by mouth daily.    No current facility-administered medications on file prior to visit.   Past Medical History  Diagnosis Date  . CKD (chronic kidney disease) stage 5, GFR less than 15 ml/min     per [facility] renal (Dr. Alfonse Alpers) considering transplant, referred for HD access and initiation  . Diabetes mellitus type II     currently diet controlled since losing weight  . HLD (hyperlipidemia)   . GERD (gastroesophageal reflux disease)   . HTN (hypertension) pre-2003  . Urge incontinence     Dr. Matilde Sprang to do PTNS  .  Vitamin D deficiency   . Glaucoma     L>R  . DJD (degenerative joint disease)   . Osteoarthritis   . Vaginal atrophy     vagifem caused spotting, normal TVS  . Anemia in chronic kidney disease     s/p aranesp 11/2013 now starting epo with HD 06/2014    Review of Systems Per HPI unless specifically indicated above     Objective:    BP 110/74 mmHg  Pulse 66  Temp(Src) 98.7 F (37.1 C) (Oral)  Wt 144 lb 12.8 oz (65.681 kg)  SpO2 97%  Wt Readings from Last 3 Encounters:  01/14/15 144 lb 12.8 oz (65.681 kg)  11/05/14 147 lb (66.679 kg)  10/27/14 146 lb (66.225 kg)    Physical Exam  Constitutional: She appears well-developed and well-nourished. No distress.  HENT:  Head: Normocephalic and atraumatic.  Right Ear: Hearing, tympanic membrane, external ear and ear canal normal.  Left Ear: Hearing, tympanic membrane, external ear and ear canal normal.  Nose: Mucosal edema (mild) present. No rhinorrhea. Right sinus exhibits no maxillary sinus tenderness and no frontal sinus tenderness. Left sinus exhibits no maxillary sinus tenderness and no frontal sinus tenderness.  Mouth/Throat: Uvula is midline, oropharynx is clear and moist and mucous membranes are normal. No oropharyngeal exudate, posterior oropharyngeal edema, posterior oropharyngeal erythema or tonsillar abscesses.  Eyes: Conjunctivae and EOM are normal. Pupils are equal, round, and reactive to light. No scleral icterus.  Neck: Normal range of motion. Neck supple.  Cardiovascular: Normal rate, regular rhythm, normal heart sounds and intact distal pulses.   No murmur heard. Pulmonary/Chest: Effort normal. No respiratory distress. She has decreased breath sounds. She has wheezes (mild faint). She has rhonchi in the right middle field, the right lower field and the left middle field. She has no rales.  Lymphadenopathy:    She has no cervical adenopathy.  Skin: Skin is warm and dry. No rash noted.  Nursing note and vitals  reviewed.  Results for orders placed or performed in visit on 08/14/14  HM PAP SMEAR  Result Value Ref Range   HM Pap smear Normal-Repeat 3 years       Assessment & Plan:   Problem List Items Addressed This Visit    ESRD (end stage renal disease) on dialysis    Doing well on HD MWF.       Controlled type 2 diabetes mellitus with diabetic nephropathy    Will need to monitor sugars while on prednisone.      Acute bronchitis - Primary    Treat aggressively with zpack and prednisone course given comorbidities.  Tessalon perls for cough. Supportive care as per instructions.  Red  flags to return discussed.           Follow up plan: Return if symptoms worsen or fail to improve.

## 2015-01-21 ENCOUNTER — Telehealth: Payer: Self-pay | Admitting: *Deleted

## 2015-01-21 MED ORDER — AMOXICILLIN-POT CLAVULANATE 500-125 MG PO TABS: 1.0000 | ORAL_TABLET | Freq: Every day | ORAL | Status: DC

## 2015-01-21 MED ORDER — AMOXICILLIN-POT CLAVULANATE 500-125 MG PO TABS: 1.0000 | ORAL_TABLET | Freq: Two times a day (BID) | ORAL | Status: DC

## 2015-01-21 NOTE — Telephone Encounter (Signed)
Not feeling well and asking for another abx, head congested and coughing up phlegm. Uses [facility] [address] [place].

## 2015-01-21 NOTE — Telephone Encounter (Signed)
Message left notifying patient.

## 2015-01-21 NOTE — Telephone Encounter (Signed)
seen 4/14 with bronchitis and treated with azithromycin. Now with persistent head congestion and productive cough. Will treat for sinusitis with renally dosed augmentin course.

## 2015-02-09 ENCOUNTER — Ambulatory Visit: Payer: 59 | Admitting: Family Medicine

## 2015-02-09 DIAGNOSIS — Z0289 Encounter for other administrative examinations: Secondary | ICD-10-CM

## 2015-03-10 ENCOUNTER — Ambulatory Visit (INDEPENDENT_AMBULATORY_CARE_PROVIDER_SITE_OTHER): Payer: 59

## 2015-03-10 ENCOUNTER — Encounter: Payer: Self-pay | Admitting: Podiatry

## 2015-03-10 ENCOUNTER — Ambulatory Visit (INDEPENDENT_AMBULATORY_CARE_PROVIDER_SITE_OTHER): Payer: 59 | Admitting: Podiatry

## 2015-03-10 VITALS — BP 126/70 | HR 69 | Resp 16 | Ht 63.0 in | Wt 146.0 lb

## 2015-03-10 DIAGNOSIS — M722 Plantar fascial fibromatosis: Secondary | ICD-10-CM

## 2015-03-10 MED ORDER — METHYLPREDNISOLONE 4 MG PO TBPK: ORAL_TABLET | ORAL | Status: DC

## 2015-03-10 NOTE — Progress Notes (Signed)
   Subjective:    Patient ID: Kristen Kirk, female    DOB: [birth year], 64 y.o.   MRN: [id number] Pt has plantar heel pain in left foot  . Pt.  states that her heel bothers her more at night when she works ( [employer] ).  As far as treatments she has gotten insoles and this has not worked.  Pt is not diabetic.    HPI    Review of Systems  All other systems reviewed and are negative.      Objective:   Physical Exam: I have reviewed her past history medications allergies surgery social history and review of systems. Currently on dialysis with kidney failure due to hypertension. Pulses are strongly palpable bilateral neurologic sensorium is intact percent lasting monofilament. Deep tendon reflexes intact bilateral and muscle strength was 5 over 5 dorsiflexion and plantar flexors and inverters everters all intrinsic musculature is intact. Orthopedic evaluation demonstrates pain on palpation medial calcaneal tubercle of the left heel otherwise all joints distal to the ankle for range of motion without crepitation. Cutaneous evaluation of Mr. supple well-hydrated cutis no erythema edema saline as drainage or odor. Radiographs demonstrate soft tissue increase in density at the plantar fascial calcaneal insertion site the left.        Assessment & Plan:  Assessment: Plantar fasciitis left foot.  Plan: We discussed etiology pathology conservative versus surgical therapies. I injected her left heel today with Kenalog and local anesthesia. Prepared a prescription for a Medrol Dosepak. Dispense a night splint and plantar fascial brace. We discussed etiology pathology conservative versus surgical therapies and I will follow-up with her in 1 month.

## 2015-03-16 ENCOUNTER — Ambulatory Visit: Payer: Self-pay | Admitting: Podiatry

## 2015-04-07 ENCOUNTER — Ambulatory Visit: Payer: 59 | Admitting: Podiatry

## 2015-05-10 ENCOUNTER — Other Ambulatory Visit: Payer: Self-pay | Admitting: Family Medicine

## 2015-06-22 ENCOUNTER — Ambulatory Visit: Payer: 59 | Admitting: Podiatry

## 2015-06-29 ENCOUNTER — Ambulatory Visit: Payer: 59 | Admitting: Podiatry

## 2015-06-30 ENCOUNTER — Encounter: Payer: Self-pay | Admitting: Podiatry

## 2015-06-30 ENCOUNTER — Ambulatory Visit (INDEPENDENT_AMBULATORY_CARE_PROVIDER_SITE_OTHER): Payer: 59 | Admitting: Podiatry

## 2015-06-30 VITALS — BP 133/72 | HR 71 | Resp 18

## 2015-06-30 DIAGNOSIS — M722 Plantar fascial fibromatosis: Secondary | ICD-10-CM

## 2015-07-01 NOTE — Progress Notes (Signed)
She presents for follow-up of plantar fasciitis to her left heel. She states that they have been doing very well until just a couple weeks ago started back. She continues conservative therapies at home since his her night splint anti-inflammatory's in her daily brace.   Objective: vital signs are stable she's alert and oriented 3 pulses are palpable. Neurologic sensorium is intact per Semmes-Weinstein monofilament. Deep tendon reflexes are intact bilateral muscle strength is intact bilateral she has pain on palpation medial calcaneal tubercle of the left heel  The less than previously noted.    assessment: plantar fasciitis left.  Plan: injected the left heel today with Kenalog and local and aesthetic I encouraged her to continue all conservative therapies listed above.   Roselind Messier DPM

## 2015-08-02 ENCOUNTER — Encounter: Payer: Self-pay | Admitting: Podiatry

## 2015-08-02 ENCOUNTER — Ambulatory Visit (INDEPENDENT_AMBULATORY_CARE_PROVIDER_SITE_OTHER): Payer: 59 | Admitting: Podiatry

## 2015-08-02 VITALS — BP 159/76 | HR 67 | Resp 16

## 2015-08-02 DIAGNOSIS — M722 Plantar fascial fibromatosis: Secondary | ICD-10-CM | POA: Diagnosis not present

## 2015-08-02 NOTE — Progress Notes (Signed)
She presents today for follow-up of her plantar fasciitis. She states that it seems to be doing a little better.  Objective: Vital signs are stable [place] 3 pulses are palpable. No open lesions or wounds. She has pain on palpation medial calcaneal tubercle of the left heel.  Assessment: Plantar fasciitis chronic in nature.  Plan: Reinjected the area today continue other conservative therapies. Follow up with me in 1 month  [facility] DPM

## 2015-09-09 ENCOUNTER — Encounter: Payer: Self-pay | Admitting: Family Medicine

## 2015-09-09 ENCOUNTER — Ambulatory Visit (INDEPENDENT_AMBULATORY_CARE_PROVIDER_SITE_OTHER): Payer: 59 | Admitting: Family Medicine

## 2015-09-09 VITALS — BP 118/60 | HR 68 | Temp 98.6°F | Wt 150.0 lb

## 2015-09-09 DIAGNOSIS — E1121 Type 2 diabetes mellitus with diabetic nephropathy: Secondary | ICD-10-CM | POA: Diagnosis not present

## 2015-09-09 DIAGNOSIS — N186 End stage renal disease: Secondary | ICD-10-CM | POA: Diagnosis not present

## 2015-09-09 DIAGNOSIS — Z992 Dependence on renal dialysis: Secondary | ICD-10-CM | POA: Diagnosis not present

## 2015-09-09 DIAGNOSIS — J069 Acute upper respiratory infection, unspecified: Secondary | ICD-10-CM

## 2015-09-09 DIAGNOSIS — B9789 Other viral agents as the cause of diseases classified elsewhere: Principal | ICD-10-CM

## 2015-09-09 MED ORDER — GUAIFENESIN-CODEINE 100-10 MG/5ML PO SYRP: 5.0000 mL | ORAL_SOLUTION | Freq: Two times a day (BID) | ORAL | Status: DC | PRN

## 2015-09-09 MED ORDER — AMOXICILLIN 500 MG PO CAPS: 500.0000 mg | ORAL_CAPSULE | Freq: Every day | ORAL | Status: DC

## 2015-09-09 NOTE — Progress Notes (Signed)
BP 118/60 mmHg  Pulse 68  Temp(Src) 98.6 F (37 C) (Oral)  Wt 150 lb (68.04 kg)  SpO2 99%  LMP  (LMP Unknown)   CC: URI  Subjective:    Patient ID: Kristen Kirk, female    DOB: 1951-01-04, 64 y.o.   MRN: [id number]  HPI: Kristen Kirk is a 64 y.o. female presenting on 09/09/2015 for URI   Head cold bothering her for the past week. Planning on going out of town for Christmas.  Sinus congestion and pressure, blowing nose, some coughing. +PNDrainage.   No fevers/chills, ear or tooth pain, abd pain, no ST.  No sick contacts at home. + sick contacts at work. No smokers at home. No h/o asthma, no allergies.  So far has tried nothing for this.   CKD stage 5 followed by Dr Alfonse Alpers [facility] now on HD MWF.  Relevant past medical, surgical, family and social history reviewed and updated as indicated. Interim medical history since our last visit reviewed. Allergies and medications reviewed and updated. Current Outpatient Prescriptions on File Prior to Visit  Medication Sig  . amLODipine (NORVASC) 10 MG tablet TAKE 1 TABLET BY MOUTH DAILY  . Ascorbic Acid (VITAMIN C) 100 MG tablet Take 100 mg by mouth daily.    Marland Kitchen aspirin 81 MG tablet Take 81 mg by mouth daily.  . Blood Glucose Monitoring Suppl (ONE TOUCH ULTRA SYSTEM KIT) W/DEVICE KIT 1 kit by Does not apply route once.  . carvedilol (COREG) 12.5 MG tablet Take 1 tablet (12.5 mg total) by mouth 2 (two) times daily with a meal.  . Cholecalciferol (VITAMIN D3) 1000 UNITS CAPS Take 1 capsule by mouth 2 (two) times daily.    . cyclobenzaprine (FLEXERIL) 5 MG tablet Take 1-2 tablets (5-10 mg total) by mouth 2 (two) times daily as needed for muscle spasms.  . darbepoetin alfa-polysorbate (ARANESP, ALBUMIN FREE,) 60 MCG/ML injection Inject 60 mcg into the skin every 6 (six) weeks. Per renal  . diclofenac sodium (VOLTAREN) 1 % GEL Apply 1 application topically 3 (three) times daily.  . dorzolamide-timolol (COSOPT) 22.3-6.8 MG/ML ophthalmic  solution Place 1 drop into both eyes 3 (three) times daily.    Marland Kitchen esomeprazole (NEXIUM) 40 MG capsule Take 40 mg by mouth daily.   . furosemide (LASIX) 40 MG tablet Take 1 tablet (40 mg total) by mouth 2 (two) times daily.  Marland Kitchen glucose blood test strip Check sugars once daily  . hydrOXYzine (ATARAX/VISTARIL) 25 MG tablet Take 1 tablet (25 mg total) by mouth 2 (two) times daily as needed.  . meclizine (ANTIVERT) 25 MG tablet Take 1 tablet (25 mg total) by mouth 3 (three) times daily as needed.  . Multiple Vitamin (MULTIVITAMIN) tablet Take 1 tablet by mouth daily.    . polyethylene glycol powder (GLYCOLAX/MIRALAX) powder Take 17 g by mouth daily as needed.  . pravastatin (PRAVACHOL) 80 MG tablet Take 1 tablet by mouth  every evening  . sevelamer carbonate (RENVELA) 800 MG tablet Take 800 mg by mouth 3 (three) times daily with meals.  . sodium bicarbonate 650 MG tablet Take 1 tablet (650 mg total) by mouth 3 (three) times daily.  . traMADol (ULTRAM) 50 MG tablet TAKE 1/2 TO 1 TABLET EVERY DAY AS NEEDED  . TRAVATAN Z 0.004 % SOLN ophthalmic solution   . travoprost, benzalkonium, (TRAVATAN) 0.004 % ophthalmic solution Place 1 drop into both eyes at bedtime.    . traZODone (DESYREL) 50 MG tablet Take 50 mg  by mouth at bedtime as needed.   . vitamin E 100 UNIT capsule Take 400 Units by mouth daily.   . cinacalcet (SENSIPAR) 30 MG tablet Take 30 mg by mouth daily.   No current facility-administered medications on file prior to visit.    Review of Systems Per HPI unless specifically indicated in ROS section     Objective:    BP 118/60 mmHg  Pulse 68  Temp(Src) 98.6 F (37 C) (Oral)  Wt 150 lb (68.04 kg)  SpO2 99%  LMP  (LMP Unknown)  Wt Readings from Last 3 Encounters:  09/09/15 150 lb (68.04 kg)  03/10/15 146 lb (66.225 kg)  01/14/15 144 lb 12.8 oz (65.681 kg)    Physical Exam  Constitutional: She appears well-developed and well-nourished. No distress.  HENT:  Head: Normocephalic and  atraumatic.  Right Ear: Hearing, tympanic membrane, external ear and ear canal normal.  Left Ear: Hearing, tympanic membrane, external ear and ear canal normal.  Nose: Mucosal edema (mild) and rhinorrhea present. Right sinus exhibits no maxillary sinus tenderness and no frontal sinus tenderness. Left sinus exhibits no maxillary sinus tenderness and no frontal sinus tenderness.  Mouth/Throat: Uvula is midline, oropharynx is clear and moist and mucous membranes are normal. No oropharyngeal exudate, posterior oropharyngeal edema, posterior oropharyngeal erythema or tonsillar abscesses.  Congested nasal mucosa  Eyes: Conjunctivae and EOM are normal. Pupils are equal, round, and reactive to light. No scleral icterus.  Neck: Normal range of motion. Neck supple.  Cardiovascular: Normal rate, regular rhythm, normal heart sounds and intact distal pulses.   No murmur heard. Pulmonary/Chest: Effort normal and breath sounds normal. No respiratory distress. She has no wheezes. She has no rales.  Lymphadenopathy:    She has no cervical adenopathy.  Skin: Skin is warm and dry. No rash noted.  Nursing note and vitals reviewed.  Results for orders placed or performed in visit on 08/14/14  HM PAP SMEAR  Result Value Ref Range   HM Pap smear Normal-Repeat 3 years       Assessment & Plan:   Problem List Items Addressed This Visit    Viral URI with cough - Primary    Anticipate viral URI with cough given short duration. However does have significant comorbidities, on dialysis. Supportive care discussed, cheratussin for sleep. Provided with WASP for renally dosed amoxicillin to fill if sxs worsening or not improving as expected. Pt agrees with plan.      ESRD (end stage renal disease) on dialysis ([place])   Controlled type 2 diabetes mellitus with diabetic nephropathy (HCC)   Relevant Medications   lisinopril (PRINIVIL,ZESTRIL) 10 MG tablet       Follow up plan: Return if symptoms worsen or fail to  improve.

## 2015-09-09 NOTE — Patient Instructions (Signed)
You have a viral upper respiratory infection. Viral infections usually take 7-10 days to resolve.  The cough can last a few weeks to go away. Use medication as prescribed: cheratussin cough syrup for when you sleep. Push fluids and plenty of rest.  If worsening productive cough, fever >101, or worsening symptoms after initial improvement, fill antibiotic provided today. (amoxicillin) Call clinic with questions.  Good to see you today. I hope you start feeling better soon.

## 2015-09-09 NOTE — Progress Notes (Signed)
Pre visit review using our clinic review tool, if applicable. No additional management support is needed unless otherwise documented below in the visit note. 

## 2015-09-09 NOTE — Assessment & Plan Note (Signed)
Anticipate viral URI with cough given short duration. However does have significant comorbidities, on dialysis. Supportive care discussed, cheratussin for sleep. Provided with WASP for renally dosed amoxicillin to fill if sxs worsening or not improving as expected. Pt agrees with plan.

## 2015-09-22 ENCOUNTER — Other Ambulatory Visit: Payer: Self-pay | Admitting: Family Medicine

## 2015-09-22 MED ORDER — AMLODIPINE BESYLATE 10 MG PO TABS: 10.0000 mg | ORAL_TABLET | Freq: Every day | ORAL | Status: DC

## 2015-09-22 NOTE — Addendum Note (Signed)
Addended by: Royann Shivers A on: 09/22/2015 06:43 PM   Modules accepted: Orders

## 2015-09-23 ENCOUNTER — Other Ambulatory Visit: Payer: Self-pay | Admitting: Family Medicine

## 2015-10-29 ENCOUNTER — Ambulatory Visit (INDEPENDENT_AMBULATORY_CARE_PROVIDER_SITE_OTHER): Payer: 59 | Admitting: Family Medicine

## 2015-10-29 ENCOUNTER — Encounter: Payer: Self-pay | Admitting: Family Medicine

## 2015-10-29 ENCOUNTER — Telehealth: Payer: Self-pay | Admitting: Radiology

## 2015-10-29 VITALS — BP 160/80 | HR 64 | Temp 98.4°F | Wt 150.2 lb

## 2015-10-29 DIAGNOSIS — K219 Gastro-esophageal reflux disease without esophagitis: Secondary | ICD-10-CM | POA: Diagnosis not present

## 2015-10-29 DIAGNOSIS — I10 Essential (primary) hypertension: Secondary | ICD-10-CM | POA: Diagnosis not present

## 2015-10-29 DIAGNOSIS — R1013 Epigastric pain: Secondary | ICD-10-CM | POA: Diagnosis not present

## 2015-10-29 DIAGNOSIS — N186 End stage renal disease: Secondary | ICD-10-CM

## 2015-10-29 DIAGNOSIS — Z992 Dependence on renal dialysis: Secondary | ICD-10-CM

## 2015-10-29 LAB — CBC WITH DIFFERENTIAL/PLATELET
BASOS ABS: 0.1 10*3/uL (ref 0.0–0.1)
Basophils Relative: 0.7 % (ref 0.0–3.0)
EOS PCT: 4.1 % (ref 0.0–5.0)
Eosinophils Absolute: 0.3 10*3/uL (ref 0.0–0.7)
HEMATOCRIT: 41.6 % (ref 36.0–46.0)
Hemoglobin: 13.3 g/dL (ref 12.0–15.0)
LYMPHS ABS: 2 10*3/uL (ref 0.7–4.0)
Lymphocytes Relative: 26.6 % (ref 12.0–46.0)
MCHC: 32.1 g/dL (ref 30.0–36.0)
MCV: 87.7 fl (ref 78.0–100.0)
Monocytes Absolute: 0.7 10*3/uL (ref 0.1–1.0)
Monocytes Relative: 10 % (ref 3.0–12.0)
NEUTROS ABS: 4.3 10*3/uL (ref 1.4–7.7)
NEUTROS PCT: 58.6 % (ref 43.0–77.0)
PLATELETS: 223 10*3/uL (ref 150.0–400.0)
RBC: 4.74 Mil/uL (ref 3.87–5.11)
RDW: 16 % — ABNORMAL HIGH (ref 11.5–15.5)
WBC: 7.4 10*3/uL (ref 4.0–10.5)

## 2015-10-29 LAB — COMPREHENSIVE METABOLIC PANEL
ALK PHOS: 77 U/L (ref 39–117)
ALT: 12 U/L (ref 0–35)
AST: 16 U/L (ref 0–37)
Albumin: 4.3 g/dL (ref 3.5–5.2)
BILIRUBIN TOTAL: 0.3 mg/dL (ref 0.2–1.2)
BUN: 28 mg/dL — AB (ref 6–23)
CO2: 32 mEq/L (ref 19–32)
Calcium: 9 mg/dL (ref 8.4–10.5)
Chloride: 97 mEq/L (ref 96–112)
Creatinine, Ser: 4.38 mg/dL — ABNORMAL HIGH (ref 0.40–1.20)
GFR: 13.09 mL/min — CL (ref >60.00)
GLUCOSE: 75 mg/dL (ref 70–99)
Potassium: 3.5 mEq/L (ref 3.5–5.1)
SODIUM: 139 meq/L (ref 135–145)
TOTAL PROTEIN: 7.6 g/dL (ref 6.0–8.3)

## 2015-10-29 LAB — LIPASE: Lipase: 45 U/L (ref 11.0–59.0)

## 2015-10-29 MED ORDER — SUCRALFATE 1 G PO TABS: 1.0000 g | ORAL_TABLET | Freq: Every day | ORAL | Status: DC

## 2015-10-29 MED ORDER — ESOMEPRAZOLE MAGNESIUM 40 MG PO CPDR: 40.0000 mg | DELAYED_RELEASE_CAPSULE | Freq: Every day | ORAL | Status: DC

## 2015-10-29 NOTE — Telephone Encounter (Signed)
Known ESRD 

## 2015-10-29 NOTE — Assessment & Plan Note (Signed)
Pt will take antihypertensive regimen when she gets home today.

## 2015-10-29 NOTE — Telephone Encounter (Signed)
[facility] called a critical GFR, 13.09. Results given to Dr Danise Mina

## 2015-10-29 NOTE — Progress Notes (Signed)
Pre visit review using our clinic review tool, if applicable. No additional management support is needed unless otherwise documented below in the visit note. 

## 2015-10-29 NOTE — Assessment & Plan Note (Signed)
Worsening abd pain over last 1 week.  Check labs today (CMP, CBC, lipase). Anticipate possible gastritis vs worsening GERD.  Rec start nexium 40mg  daily, start carafate 1 tab daily prior to meal.  Update if not improving with treatment.

## 2015-10-29 NOTE — Patient Instructions (Signed)
I wonder about gastritis - treat with nexium once daily and carafate once daily for 10-14 days. Avoid spicy and acidic foods.  labwork today. Let us know if not improving with treatment.

## 2015-10-29 NOTE — Progress Notes (Signed)
BP 160/80 mmHg  Pulse 64  Temp(Src) 98.4 F (36.9 C) (Oral)  Wt 150 lb 4 oz (68.153 kg)  LMP  (LMP Unknown)   CC: epigastric abd pain  Subjective:    Patient ID: Kristen Kirk, female    DOB: Apr 12, 1951, 65 y.o.   MRN: [id number]  HPI: Kristen Kirk is a 65 y.o. female presenting on 10/29/2015 for Abdominal Pain   1 wk h/o intermittent epigastric abdominal pain that have woken her up at night. Pain described as hurting achey pain. Pressure on epigastric region helps. No fevers/chills, nausea/vomiting, diarrhea or constipation. No increased gassiness. Belching stable. No blood in stool or urine. No lower abdominal pain or chest pain, coughing or wheezing. No boring pain to back. No weight loss.  No recent antibiotic use, no recent NSAID. Not on blood thinner.  No diet changes. Avoids spicy foods. Drinks caffeine free soda seldomly.   Known ESRD on HD access is L forearm fistula, T2DM, GERD on nexium '40mg'$  - takes on average 2-3 times/wk.   Has not taken bp meds yet this morning - went from work to dialysis.   Relevant past medical, surgical, family and social history reviewed and updated as indicated. Interim medical history since our last visit reviewed. Allergies and medications reviewed and updated. Current Outpatient Prescriptions on File Prior to Visit  Medication Sig  . amLODipine (NORVASC) 10 MG tablet Take 1 tablet (10 mg total) by mouth daily.  . Ascorbic Acid (VITAMIN C) 100 MG tablet Take 100 mg by mouth daily.    Marland Kitchen aspirin 81 MG tablet Take 81 mg by mouth daily.  . carvedilol (COREG) 12.5 MG tablet Take 1 tablet (12.5 mg total) by mouth 2 (two) times daily with a meal.  . Cholecalciferol (VITAMIN D3) 1000 UNITS CAPS Take 1 capsule by mouth 2 (two) times daily.    . cinacalcet (SENSIPAR) 30 MG tablet Take 30 mg by mouth daily.  . cyclobenzaprine (FLEXERIL) 5 MG tablet Take 1-2 tablets (5-10 mg total) by mouth 2 (two) times daily as needed for muscle spasms.  .  darbepoetin alfa-polysorbate (ARANESP, ALBUMIN FREE,) 60 MCG/ML injection Inject 60 mcg into the skin every 6 (six) weeks. Per renal  . diclofenac sodium (VOLTAREN) 1 % GEL Apply 1 application topically 3 (three) times daily.  . dorzolamide-timolol (COSOPT) 22.3-6.8 MG/ML ophthalmic solution Place 1 drop into both eyes 3 (three) times daily.    . furosemide (LASIX) 40 MG tablet Take 1 tablet (40 mg total) by mouth 2 (two) times daily.  . hydrOXYzine (ATARAX/VISTARIL) 25 MG tablet Take 1 tablet (25 mg total) by mouth 2 (two) times daily as needed.  Marland Kitchen lisinopril (PRINIVIL,ZESTRIL) 10 MG tablet Take 5 mg by mouth daily.  . meclizine (ANTIVERT) 25 MG tablet Take 1 tablet (25 mg total) by mouth 3 (three) times daily as needed.  . Multiple Vitamin (MULTIVITAMIN) tablet Take 1 tablet by mouth daily.    . polyethylene glycol powder (GLYCOLAX/MIRALAX) powder Take 17 g by mouth daily as needed.  . pravastatin (PRAVACHOL) 80 MG tablet Take 1 tablet by mouth  every evening  . sevelamer carbonate (RENVELA) 800 MG tablet Take 800 mg by mouth 3 (three) times daily with meals.  . sodium bicarbonate 650 MG tablet Take 1 tablet (650 mg total) by mouth 3 (three) times daily.  . travoprost, benzalkonium, (TRAVATAN) 0.004 % ophthalmic solution Place 1 drop into both eyes at bedtime.    . traZODone (DESYREL) 50 MG tablet Take  50 mg by mouth at bedtime as needed.   . vitamin E 100 UNIT capsule Take 400 Units by mouth daily.   . Blood Glucose Monitoring Suppl (ONE TOUCH ULTRA SYSTEM KIT) W/DEVICE KIT 1 kit by Does not apply route once.  Marland Kitchen glucose blood test strip Check sugars once daily  . traMADol (ULTRAM) 50 MG tablet TAKE 1/2 TO 1 TABLET EVERY DAY AS NEEDED (Patient not taking: Reported on 10/29/2015)   No current facility-administered medications on file prior to visit.    Review of Systems Per HPI unless specifically indicated in ROS section     Objective:    BP 160/80 mmHg  Pulse 64  Temp(Src) 98.4 F  (36.9 C) (Oral)  Wt 150 lb 4 oz (68.153 kg)  LMP  (LMP Unknown)  Wt Readings from Last 3 Encounters:  10/29/15 150 lb 4 oz (68.153 kg)  09/09/15 150 lb (68.04 kg)  03/10/15 146 lb (66.225 kg)    Physical Exam  Constitutional: She appears well-developed and well-nourished.  HENT:  Mouth/Throat: Oropharynx is clear and moist. No oropharyngeal exudate.  Cardiovascular: Normal rate, regular rhythm, normal heart sounds and intact distal pulses.   No murmur heard. Pulmonary/Chest: Effort normal and breath sounds normal. No respiratory distress. She has no wheezes. She has no rales.  Abdominal: Soft. Normal appearance and bowel sounds are normal. She exhibits no distension and no mass. There is no hepatosplenomegaly. There is tenderness (minimal) in the epigastric area. There is no rigidity, no rebound, no guarding, no CVA tenderness and negative Murphy's sign.  Musculoskeletal: She exhibits no edema.  Psychiatric: She has a normal mood and affect.  Nursing note and vitals reviewed.     Assessment & Plan:   Problem List Items Addressed This Visit    HTN (hypertension)    Pt will take antihypertensive regimen when she gets home today.      GERD (gastroesophageal reflux disease)   Relevant Medications   sucralfate (CARAFATE) 1 g tablet   esomeprazole (NEXIUM) 40 MG capsule   ESRD (end stage renal disease) on dialysis (HCC)   Abdominal pain, epigastric - Primary    Worsening abd pain over last 1 week.  Check labs today (CMP, CBC, lipase). Anticipate possible gastritis vs worsening GERD.  Rec start nexium 51m daily, start carafate 1 tab daily prior to meal.  Update if not improving with treatment.      Relevant Orders   Comprehensive metabolic panel   CBC with Differential/Platelet   Lipase       Follow up plan: Return if symptoms worsen or fail to improve.

## 2015-11-01 ENCOUNTER — Encounter: Payer: Self-pay | Admitting: *Deleted

## 2015-11-08 ENCOUNTER — Encounter: Payer: Self-pay | Admitting: Podiatry

## 2015-11-08 ENCOUNTER — Ambulatory Visit (INDEPENDENT_AMBULATORY_CARE_PROVIDER_SITE_OTHER): Payer: 59 | Admitting: Podiatry

## 2015-11-08 DIAGNOSIS — L600 Ingrowing nail: Secondary | ICD-10-CM

## 2015-11-08 DIAGNOSIS — B351 Tinea unguium: Secondary | ICD-10-CM

## 2015-11-08 DIAGNOSIS — M79676 Pain in unspecified toe(s): Secondary | ICD-10-CM | POA: Diagnosis not present

## 2015-11-08 MED ORDER — NEOMYCIN-POLYMYXIN-HC 1 % OT SOLN: OTIC | Status: DC

## 2015-11-08 NOTE — Patient Instructions (Signed)

## 2015-11-08 NOTE — Progress Notes (Signed)
She presents today with a chief complaint of an ingrown toenail tibial border of the hallux left. She states this been bothering her for quite some time and she can hardly wear any shoes but the ones that she has on now. She also states this her nails are long and she would like to have them trimmed. She denies diabetes but states that she does work a regular shift and currently is on dialysis.  Objective: Vital signs are stable alert and oriented 3. Pulses are palpable. Sharp incurvated nail margin along the tibial border of the hallux left with pain on palpation and erythema. Her toenails are thick yellow dystrophic clinic with mycotic and painful to palpation.  Assessment: Pain in limb secondary to onychomycosis and ingrown toenail tibial border hallux left.  Plan: At this point we performed a chemical matrixectomy to the tibial border hallux left after local anesthesia was administered. She tolerated this procedure well was given both oral and written home-going instructions for care and soaking of her toe as well as a prescription for Cortisporin Otic to be applied twice daily after soaking. We also debrided her toenails 1 through 5 bilateral as a covered service secondary to pain I will follow up with her in 1-2 weeks.

## 2015-11-17 ENCOUNTER — Ambulatory Visit (INDEPENDENT_AMBULATORY_CARE_PROVIDER_SITE_OTHER): Payer: 59 | Admitting: Podiatry

## 2015-11-17 ENCOUNTER — Encounter: Payer: Self-pay | Admitting: Podiatry

## 2015-11-17 DIAGNOSIS — L600 Ingrowing nail: Secondary | ICD-10-CM

## 2015-11-17 NOTE — Progress Notes (Signed)
She presents today for follow-up of matrixectomy tibial border hallux nail left foot. She states that the toe still very sore she continues to apply Cortisporin otic drops and soaks in Epsom salts and warm water.  Objective: Vital signs are stable she is alert and oriented 3. Pulses are palpable. The toe appears to be slightly macerated along the tibial margin and the toenail is still painful on palpation. I see no signs of infection. Once or malodor.  Assessment: Well-healing matrixectomy hallux left tibial border however I feel that more than likely she is going to have to find a new para shoes with a deep toe box or have her nail removed.  Plan: Continue soaking Epsom salts and warm water and apply Cortisporin otic and to completely heal. I encouraged her to find a new para shoes with a deep toebox.

## 2015-11-17 NOTE — Patient Instructions (Signed)

## 2015-11-30 ENCOUNTER — Other Ambulatory Visit: Payer: Self-pay | Admitting: Family Medicine

## 2015-11-30 ENCOUNTER — Telehealth: Payer: Self-pay

## 2015-11-30 MED ORDER — FUROSEMIDE 40 MG PO TABS: 40.0000 mg | ORAL_TABLET | Freq: Two times a day (BID) | ORAL | Status: DC

## 2015-11-30 NOTE — Telephone Encounter (Signed)
Pt was seen 10/29/15;pt has been taking nexium once daily and carafate once daily; per AVS pt was to take nexium once daily and carafate daily for 10 - 14 days. Pt finished the carafate 11/29/15; pt is doing really well; no abd pain.pt wants to know if should continue the carafate with the nexium for awhile longer since pt is responding so well. Pt request cb. [facility] [address]

## 2015-12-01 ENCOUNTER — Other Ambulatory Visit: Payer: Self-pay | Admitting: *Deleted

## 2015-12-01 MED ORDER — AMLODIPINE BESYLATE 10 MG PO TABS: 10.0000 mg | ORAL_TABLET | Freq: Every day | ORAL | Status: DC

## 2015-12-03 NOTE — Telephone Encounter (Signed)
If doing well off carafate, ok to stay off, continue nexium only.  If symptoms recurring, would continue carafate for another 1-2 weeks.  Let me know if she needs any refills.

## 2015-12-03 NOTE — Telephone Encounter (Signed)
Message left advising patient.  

## 2015-12-06 ENCOUNTER — Observation Stay
Admission: EM | Admit: 2015-12-06 | Discharge: 2015-12-09 | Disposition: A | Discharge status: Home / Self Care | Payer: 59 | Attending: Internal Medicine | Admitting: Internal Medicine

## 2015-12-06 DIAGNOSIS — R569 Unspecified convulsions: Secondary | ICD-10-CM | POA: Insufficient documentation

## 2015-12-06 DIAGNOSIS — E559 Vitamin D deficiency, unspecified: Secondary | ICD-10-CM | POA: Insufficient documentation

## 2015-12-06 DIAGNOSIS — I10 Essential (primary) hypertension: Secondary | ICD-10-CM

## 2015-12-06 DIAGNOSIS — E1121 Type 2 diabetes mellitus with diabetic nephropathy: Secondary | ICD-10-CM | POA: Diagnosis not present

## 2015-12-06 DIAGNOSIS — Z79899 Other long term (current) drug therapy: Secondary | ICD-10-CM | POA: Insufficient documentation

## 2015-12-06 DIAGNOSIS — Z888 Allergy status to other drugs, medicaments and biological substances status: Secondary | ICD-10-CM | POA: Diagnosis not present

## 2015-12-06 DIAGNOSIS — R55 Syncope and collapse: Secondary | ICD-10-CM | POA: Diagnosis not present

## 2015-12-06 DIAGNOSIS — M1990 Unspecified osteoarthritis, unspecified site: Secondary | ICD-10-CM | POA: Diagnosis not present

## 2015-12-06 DIAGNOSIS — E785 Hyperlipidemia, unspecified: Secondary | ICD-10-CM | POA: Diagnosis present

## 2015-12-06 DIAGNOSIS — D631 Anemia in chronic kidney disease: Secondary | ICD-10-CM | POA: Diagnosis not present

## 2015-12-06 DIAGNOSIS — Z833 Family history of diabetes mellitus: Secondary | ICD-10-CM | POA: Insufficient documentation

## 2015-12-06 DIAGNOSIS — Z992 Dependence on renal dialysis: Secondary | ICD-10-CM | POA: Diagnosis present

## 2015-12-06 DIAGNOSIS — R42 Dizziness and giddiness: Secondary | ICD-10-CM | POA: Insufficient documentation

## 2015-12-06 DIAGNOSIS — J069 Acute upper respiratory infection, unspecified: Secondary | ICD-10-CM | POA: Diagnosis not present

## 2015-12-06 DIAGNOSIS — Z7982 Long term (current) use of aspirin: Secondary | ICD-10-CM | POA: Diagnosis not present

## 2015-12-06 DIAGNOSIS — E1122 Type 2 diabetes mellitus with diabetic chronic kidney disease: Secondary | ICD-10-CM | POA: Insufficient documentation

## 2015-12-06 DIAGNOSIS — Z8349 Family history of other endocrine, nutritional and metabolic diseases: Secondary | ICD-10-CM | POA: Diagnosis not present

## 2015-12-06 DIAGNOSIS — I120 Hypertensive chronic kidney disease with stage 5 chronic kidney disease or end stage renal disease: Secondary | ICD-10-CM | POA: Insufficient documentation

## 2015-12-06 DIAGNOSIS — Z87891 Personal history of nicotine dependence: Secondary | ICD-10-CM | POA: Diagnosis not present

## 2015-12-06 DIAGNOSIS — Z91013 Allergy to seafood: Secondary | ICD-10-CM | POA: Diagnosis not present

## 2015-12-06 DIAGNOSIS — R918 Other nonspecific abnormal finding of lung field: Secondary | ICD-10-CM | POA: Diagnosis not present

## 2015-12-06 DIAGNOSIS — R112 Nausea with vomiting, unspecified: Secondary | ICD-10-CM | POA: Diagnosis not present

## 2015-12-06 DIAGNOSIS — K219 Gastro-esophageal reflux disease without esophagitis: Secondary | ICD-10-CM

## 2015-12-06 DIAGNOSIS — H409 Unspecified glaucoma: Secondary | ICD-10-CM | POA: Insufficient documentation

## 2015-12-06 DIAGNOSIS — Z823 Family history of stroke: Secondary | ICD-10-CM | POA: Insufficient documentation

## 2015-12-06 DIAGNOSIS — E875 Hyperkalemia: Secondary | ICD-10-CM | POA: Insufficient documentation

## 2015-12-06 DIAGNOSIS — N186 End stage renal disease: Secondary | ICD-10-CM | POA: Insufficient documentation

## 2015-12-06 DIAGNOSIS — I739 Peripheral vascular disease, unspecified: Secondary | ICD-10-CM | POA: Insufficient documentation

## 2015-12-06 DIAGNOSIS — Z794 Long term (current) use of insulin: Secondary | ICD-10-CM | POA: Insufficient documentation

## 2015-12-06 DIAGNOSIS — R001 Bradycardia, unspecified: Secondary | ICD-10-CM | POA: Diagnosis not present

## 2015-12-06 DIAGNOSIS — R05 Cough: Secondary | ICD-10-CM | POA: Diagnosis not present

## 2015-12-06 DIAGNOSIS — J101 Influenza due to other identified influenza virus with other respiratory manifestations: Secondary | ICD-10-CM | POA: Insufficient documentation

## 2015-12-06 HISTORY — DX: End stage renal disease: N18.6

## 2015-12-06 HISTORY — DX: Syncope and collapse: R55

## 2015-12-06 HISTORY — DX: Essential (primary) hypertension: I10

## 2015-12-06 HISTORY — DX: Personal history of other medical treatment: Z92.89

## 2015-12-06 NOTE — ED Notes (Addendum)
Pt arrived via EMS from work at [employer]. Pt was working when she had a syncopal episode. Co-workers report that as she was on the floor she was shaking. When EMS arrived pt was alert. Pt was in NSR but then had a run of brady/bigemeny with a rate of 36. FSBS was 118 with EMS. Pt is a dialysis pt and had dialysis today, pt reports that there were no problems. Husband has recently been sick with the flu.

## 2015-12-07 ENCOUNTER — Observation Stay (HOSPITAL_BASED_OUTPATIENT_CLINIC_OR_DEPARTMENT_OTHER)
Admit: 2015-12-07 | Discharge: 2015-12-07 | Disposition: A | Discharge status: Home / Self Care | Payer: 59 | Attending: Internal Medicine | Admitting: Internal Medicine

## 2015-12-07 ENCOUNTER — Emergency Department: Payer: 59

## 2015-12-07 ENCOUNTER — Encounter: Payer: Self-pay | Admitting: Emergency Medicine

## 2015-12-07 DIAGNOSIS — N186 End stage renal disease: Secondary | ICD-10-CM | POA: Diagnosis not present

## 2015-12-07 DIAGNOSIS — Z992 Dependence on renal dialysis: Secondary | ICD-10-CM

## 2015-12-07 DIAGNOSIS — I10 Essential (primary) hypertension: Secondary | ICD-10-CM | POA: Diagnosis not present

## 2015-12-07 DIAGNOSIS — E1121 Type 2 diabetes mellitus with diabetic nephropathy: Secondary | ICD-10-CM

## 2015-12-07 DIAGNOSIS — R55 Syncope and collapse: Principal | ICD-10-CM

## 2015-12-07 DIAGNOSIS — J069 Acute upper respiratory infection, unspecified: Secondary | ICD-10-CM

## 2015-12-07 DIAGNOSIS — E785 Hyperlipidemia, unspecified: Secondary | ICD-10-CM

## 2015-12-07 DIAGNOSIS — R404 Transient alteration of awareness: Secondary | ICD-10-CM | POA: Diagnosis not present

## 2015-12-07 LAB — URINALYSIS COMPLETE WITH MICROSCOPIC (ARMC ONLY)
BILIRUBIN URINE: NEGATIVE
Bacteria, UA: NONE SEEN
GLUCOSE, UA: 50 mg/dL — AB
KETONES UR: NEGATIVE mg/dL
Leukocytes, UA: NEGATIVE
NITRITE: NEGATIVE
Protein, ur: 30 mg/dL — AB
SPECIFIC GRAVITY, URINE: 1.005 (ref 1.005–1.030)
pH: 9 — ABNORMAL HIGH (ref 5.0–8.0)

## 2015-12-07 LAB — GLUCOSE, CAPILLARY
GLUCOSE-CAPILLARY: 89 mg/dL (ref 65–99)
GLUCOSE-CAPILLARY: 83 mg/dL (ref 65–99)
GLUCOSE-CAPILLARY: 112 mg/dL — AB (ref 65–99)
Glucose-Capillary: 79 mg/dL (ref 65–99)
Glucose-Capillary: 96 mg/dL (ref 65–99)

## 2015-12-07 LAB — BASIC METABOLIC PANEL
ANION GAP: 12 (ref 5–15)
ANION GAP: 9 (ref 5–15)
BUN: 46 mg/dL — AB (ref 6–20)
BUN: 51 mg/dL — ABNORMAL HIGH (ref 6–20)
CALCIUM: 9.3 mg/dL (ref 8.9–10.3)
CHLORIDE: 104 mmol/L (ref 101–111)
CO2: 25 mmol/L (ref 22–32)
CO2: 27 mmol/L (ref 22–32)
Calcium: 9.4 mg/dL (ref 8.9–10.3)
Chloride: 102 mmol/L (ref 101–111)
Creatinine, Ser: 6.62 mg/dL — ABNORMAL HIGH (ref 0.44–1.00)
Creatinine, Ser: 6.99 mg/dL — ABNORMAL HIGH (ref 0.44–1.00)
GFR calc Af Amer: 7 mL/min — ABNORMAL LOW (ref >60)
GFR calc non Af Amer: 6 mL/min — ABNORMAL LOW (ref >60)
GFR, EST AFRICAN AMERICAN: 6 mL/min — AB (ref >60)
GFR, EST NON AFRICAN AMERICAN: 6 mL/min — AB (ref >60)
GLUCOSE: 103 mg/dL — AB (ref 65–99)
Glucose, Bld: 94 mg/dL (ref 65–99)
POTASSIUM: 4.7 mmol/L (ref 3.5–5.1)
Potassium: 4.4 mmol/L (ref 3.5–5.1)
SODIUM: 139 mmol/L (ref 135–145)
Sodium: 140 mmol/L (ref 135–145)

## 2015-12-07 LAB — CBC
HCT: 35.5 % (ref 35.0–47.0)
HCT: 35.8 % (ref 35.0–47.0)
HEMOGLOBIN: 11.6 g/dL — AB (ref 12.0–16.0)
HEMOGLOBIN: 11.6 g/dL — AB (ref 12.0–16.0)
MCH: 26.9 pg (ref 26.0–34.0)
MCH: 26.9 pg (ref 26.0–34.0)
MCHC: 32.5 g/dL (ref 32.0–36.0)
MCHC: 32.7 g/dL (ref 32.0–36.0)
MCV: 82.2 fL (ref 80.0–100.0)
MCV: 82.8 fL (ref 80.0–100.0)
Platelets: 139 10*3/uL — ABNORMAL LOW (ref 150–440)
Platelets: 143 10*3/uL — ABNORMAL LOW (ref 150–440)
RBC: 4.31 MIL/uL (ref 3.80–5.20)
RBC: 4.33 MIL/uL (ref 3.80–5.20)
RDW: 16 % — ABNORMAL HIGH (ref 11.5–14.5)
RDW: 16.2 % — AB (ref 11.5–14.5)
WBC: 6.6 10*3/uL (ref 3.6–11.0)
WBC: 7.5 10*3/uL (ref 3.6–11.0)

## 2015-12-07 LAB — TSH: TSH: 1.801 u[IU]/mL (ref 0.350–4.500)

## 2015-12-07 LAB — MRSA PCR SCREENING: MRSA BY PCR: NEGATIVE

## 2015-12-07 LAB — INFLUENZA PANEL BY PCR (TYPE A & B)
H1N1 flu by pcr: NOT DETECTED
Influenza A By PCR: POSITIVE — AB
Influenza B By PCR: NEGATIVE

## 2015-12-07 LAB — TROPONIN I
Troponin I: <0.03 ng/mL (ref <0.031)
Troponin I: <0.03 ng/mL (ref <0.031)
Troponin I: <0.03 ng/mL (ref <0.031)

## 2015-12-07 LAB — HEMOGLOBIN A1C: Hgb A1c MFr Bld: 6 % (ref 4.0–6.0)

## 2015-12-07 MED ORDER — OSELTAMIVIR PHOSPHATE 30 MG PO CAPS
30.0000 mg | ORAL_CAPSULE | ORAL | Status: DC
  Administered 2015-12-08: 30 mg via ORAL
  Filled 2015-12-07: qty 1

## 2015-12-07 MED ORDER — VITAMIN D 1000 UNITS PO TABS
1000.0000 [IU] | ORAL_TABLET | Freq: Two times a day (BID) | ORAL | Status: DC
Start: 2015-12-07 — End: 2015-12-09
  Administered 2015-12-07 – 2015-12-09 (×4): 1000 [IU] via ORAL
  Filled 2015-12-07 (×4): qty 1

## 2015-12-07 MED ORDER — SEVELAMER CARBONATE 800 MG PO TABS
800.0000 mg | ORAL_TABLET | Freq: Three times a day (TID) | ORAL | Status: DC
  Administered 2015-12-07 – 2015-12-09 (×6): 800 mg via ORAL
  Filled 2015-12-07 (×6): qty 1

## 2015-12-07 MED ORDER — SODIUM BICARBONATE 650 MG PO TABS
650.0000 mg | ORAL_TABLET | Freq: Three times a day (TID) | ORAL | Status: DC
  Administered 2015-12-07 – 2015-12-09 (×6): 650 mg via ORAL
  Filled 2015-12-07 (×6): qty 1

## 2015-12-07 MED ORDER — AMLODIPINE BESYLATE 10 MG PO TABS
10.0000 mg | ORAL_TABLET | Freq: Every day | ORAL | Status: DC
  Administered 2015-12-07 – 2015-12-09 (×3): 10 mg via ORAL
  Filled 2015-12-07 (×3): qty 1

## 2015-12-07 MED ORDER — INSULIN ASPART 100 UNIT/ML ~~LOC~~ SOLN: 0.0000 [IU] | Freq: Every day | SUBCUTANEOUS | Status: DC

## 2015-12-07 MED ORDER — FUROSEMIDE 40 MG PO TABS
40.0000 mg | ORAL_TABLET | Freq: Two times a day (BID) | ORAL | Status: DC
  Administered 2015-12-07 – 2015-12-09 (×5): 40 mg via ORAL
  Filled 2015-12-07 (×5): qty 1

## 2015-12-07 MED ORDER — ONDANSETRON HCL 4 MG/2ML IJ SOLN: 4.0000 mg | Freq: Four times a day (QID) | INTRAMUSCULAR | Status: DC | PRN

## 2015-12-07 MED ORDER — POLYETHYLENE GLYCOL 3350 17 GM/SCOOP PO POWD: 17.0000 g | Freq: Every day | ORAL | Status: DC | PRN

## 2015-12-07 MED ORDER — GUAIFENESIN 100 MG/5ML PO SOLN: 5.0000 mL | ORAL | Status: DC | PRN

## 2015-12-07 MED ORDER — ACETAMINOPHEN 500 MG PO TABS
1000.0000 mg | ORAL_TABLET | Freq: Once | ORAL | Status: AC
  Administered 2015-12-07: 1000 mg via ORAL
  Filled 2015-12-07: qty 2

## 2015-12-07 MED ORDER — TRAVOPROST 0.004 % OP SOLN: 1.0000 [drp] | Freq: Every day | OPHTHALMIC | Status: DC

## 2015-12-07 MED ORDER — POLYETHYLENE GLYCOL 3350 17 G PO PACK: 17.0000 g | PACK | Freq: Every day | ORAL | Status: DC | PRN

## 2015-12-07 MED ORDER — CYCLOBENZAPRINE HCL 10 MG PO TABS: 5.0000 mg | ORAL_TABLET | Freq: Two times a day (BID) | ORAL | Status: DC | PRN

## 2015-12-07 MED ORDER — TRAZODONE HCL 50 MG PO TABS: 50.0000 mg | ORAL_TABLET | Freq: Every evening | ORAL | Status: DC | PRN

## 2015-12-07 MED ORDER — ASPIRIN 81 MG PO CHEW
81.0000 mg | CHEWABLE_TABLET | Freq: Every day | ORAL | Status: DC
Start: 2015-12-07 — End: 2015-12-09
  Administered 2015-12-07 – 2015-12-09 (×3): 81 mg via ORAL
  Filled 2015-12-07 (×3): qty 1

## 2015-12-07 MED ORDER — HYDROXYZINE HCL 25 MG PO TABS: 25.0000 mg | ORAL_TABLET | Freq: Two times a day (BID) | ORAL | Status: DC | PRN

## 2015-12-07 MED ORDER — MECLIZINE HCL 25 MG PO TABS: 25.0000 mg | ORAL_TABLET | Freq: Three times a day (TID) | ORAL | Status: DC | PRN

## 2015-12-07 MED ORDER — CINACALCET HCL 30 MG PO TABS
30.0000 mg | ORAL_TABLET | Freq: Every day | ORAL | Status: DC
Start: 2015-12-07 — End: 2015-12-09
  Administered 2015-12-07 – 2015-12-09 (×3): 30 mg via ORAL
  Filled 2015-12-07 (×3): qty 1

## 2015-12-07 MED ORDER — SUCRALFATE 1 G PO TABS
1.0000 g | ORAL_TABLET | Freq: Every day | ORAL | Status: DC
  Administered 2015-12-07 – 2015-12-09 (×2): 1 g via ORAL
  Filled 2015-12-07 (×2): qty 1

## 2015-12-07 MED ORDER — NEOMYCIN-POLYMYXIN-HC 1 % OT SOLN
2.0000 [drp] | Freq: Three times a day (TID) | OTIC | Status: DC
  Filled 2015-12-07 (×2): qty 10

## 2015-12-07 MED ORDER — INSULIN ASPART 100 UNIT/ML ~~LOC~~ SOLN
0.0000 [IU] | Freq: Three times a day (TID) | SUBCUTANEOUS | Status: DC
  Administered 2015-12-08: 2 [IU] via SUBCUTANEOUS
  Administered 2015-12-09: 1 [IU] via SUBCUTANEOUS
  Filled 2015-12-07: qty 1
  Filled 2015-12-07: qty 2

## 2015-12-07 MED ORDER — NEOMYCIN-POLYMYXIN-HC 3.5-10000-1 OT SUSP
2.0000 [drp] | Freq: Three times a day (TID) | OTIC | Status: DC
  Administered 2015-12-07 – 2015-12-08 (×2): 2 [drp] via OTIC
  Filled 2015-12-07: qty 10

## 2015-12-07 MED ORDER — ONDANSETRON HCL 4 MG/2ML IJ SOLN
4.0000 mg | Freq: Once | INTRAMUSCULAR | Status: AC
  Administered 2015-12-07: 4 mg via INTRAVENOUS
  Filled 2015-12-07: qty 2

## 2015-12-07 MED ORDER — PANTOPRAZOLE SODIUM 40 MG PO TBEC
40.0000 mg | DELAYED_RELEASE_TABLET | Freq: Every day | ORAL | Status: DC
  Administered 2015-12-07 – 2015-12-09 (×3): 40 mg via ORAL
  Filled 2015-12-07 (×3): qty 1

## 2015-12-07 MED ORDER — LATANOPROST 0.005 % OP SOLN
1.0000 [drp] | Freq: Every day | OPHTHALMIC | Status: DC
  Administered 2015-12-07 – 2015-12-08 (×2): 1 [drp] via OPHTHALMIC
  Filled 2015-12-07: qty 2.5

## 2015-12-07 MED ORDER — OSELTAMIVIR PHOSPHATE 75 MG PO CAPS
75.0000 mg | ORAL_CAPSULE | Freq: Two times a day (BID) | ORAL | Status: DC
  Administered 2015-12-07: 75 mg via ORAL
  Filled 2015-12-07: qty 1

## 2015-12-07 MED ORDER — HEPARIN SODIUM (PORCINE) 5000 UNIT/ML IJ SOLN
5000.0000 [IU] | Freq: Three times a day (TID) | INTRAMUSCULAR | Status: DC
  Administered 2015-12-07 – 2015-12-09 (×7): 5000 [IU] via SUBCUTANEOUS
  Filled 2015-12-07 (×7): qty 1

## 2015-12-07 MED ORDER — LISINOPRIL 5 MG PO TABS
5.0000 mg | ORAL_TABLET | Freq: Every day | ORAL | Status: DC
  Administered 2015-12-07 – 2015-12-09 (×3): 5 mg via ORAL
  Filled 2015-12-07 (×3): qty 1

## 2015-12-07 MED ORDER — CARVEDILOL 12.5 MG PO TABS
12.5000 mg | ORAL_TABLET | Freq: Two times a day (BID) | ORAL | Status: DC
Start: 2015-12-07 — End: 2015-12-09
  Administered 2015-12-07 – 2015-12-09 (×5): 12.5 mg via ORAL
  Filled 2015-12-07 (×5): qty 1

## 2015-12-07 MED ORDER — DORZOLAMIDE HCL-TIMOLOL MAL 2-0.5 % OP SOLN
1.0000 [drp] | Freq: Three times a day (TID) | OPHTHALMIC | Status: DC
Start: 2015-12-07 — End: 2015-12-09
  Administered 2015-12-07 – 2015-12-09 (×6): 1 [drp] via OPHTHALMIC
  Filled 2015-12-07 (×2): qty 10

## 2015-12-07 MED ORDER — PRAVASTATIN SODIUM 20 MG PO TABS
80.0000 mg | ORAL_TABLET | Freq: Every day | ORAL | Status: DC
  Administered 2015-12-07 – 2015-12-08 (×2): 80 mg via ORAL
  Filled 2015-12-07 (×3): qty 4

## 2015-12-07 NOTE — Consult Note (Signed)
Reason for Consult:Syncope Referring Physician: Manuella Ghazi  CC: Syncope  HPI: Kristen Kirk is an 65 y.o. female with a known history of ESRD on hemodialysis, hyperlipidemia, vertigo, osteoarthritis, GERD, and hypertension and diabetes who presented to the ED yesterday after she passed out at work. Patient works at [employer] and was placing specimens on a rack when she suddenly became nauseated and lightheaded.  Patient turned to head to the bathroom collapsed and fell on the floor. There was no head or bodily trauma.  Was noted by co-workers to have some shaking.  Patient is unsure of how long she passed out for but she estimates about 5-10 minutes. Her colleagues in the office called EMS. Patient denies any bladder/bowel incontinence or tongue biting.  She did vomit after coming to.  She reports that she has had a cough for a few days and cough was productive of clear to yellowish sputum. Her husband was recently hospitalized at the [facility] in [place] for the flu and she spent the weekend going back and forth to [facility].    Past Medical History  Diagnosis Date  . ESRD (end stage renal disease) ([place])     a. Followed @ [facility];  b. On dialysis since late 2015 (MWF).  . Diabetes mellitus type II     a. Currently diet controlled since losing weight.  Marland Kitchen HLD (hyperlipidemia)   . GERD (gastroesophageal reflux disease)   . Essential hypertension   . History of Urge Incontinence   . Vitamin D deficiency   . Glaucoma     a. L>R  . DJD (degenerative joint disease)   . Osteoarthritis   . Vaginal atrophy     a. vagifem caused spotting, normal TVS  . Anemia in chronic kidney disease     a. s/p aranesp 11/2013 now starting epo with HD 06/2014  . Syncope     a. 12/2015.  Marland Kitchen History of echocardiogram     a. 01/2015 Echo [facility]): EF ?55%, triv MR, mildly dil LA, nl PV.  Marland Kitchen History of stress test     a. 03/2013 [facility]): EF 63%, no ischemia.    Past Surgical History  Procedure Laterality Date  . Tubal  ligation  1990s    bilateral  . Bladder suspension  2011    for stress incontinence-Dr. MacDiarmid  . Refractive surgery    . Left eye laser surgery  06/2011  . Colonoscopy  02/04/2009    normal, rec rpt 10 yrs    Family History  Problem Relation Age of Onset  . Stroke Brother   . Hyperlipidemia Brother   . Hypertension Brother   . Hyperlipidemia Brother   . Hypertension Brother   . Hyperlipidemia Sister   . Hypertension Sister   . Hyperlipidemia Sister   . Hypertension Sister   . Diabetes Mother   . Heart failure Mother     died @ 41  . Heart failure Maternal Grandfather   . Other Father     she did not know her father.    Social History:  reports that she has quit smoking. She has never used smokeless tobacco. She reports that she does not drink alcohol or use illicit drugs.  Allergies  Allergen Reactions  . Brimonidine     Other reaction(s): Other (See Comments) eye redness  . Shellfish-Derived Products Swelling    Medications:  I have reviewed the patient's current medications. Prior to Admission:  Prescriptions prior to admission  Medication Sig Dispense Refill  Last Dose  . amLODipine (NORVASC) 10 MG tablet Take 1 tablet (10 mg total) by mouth daily. 90 tablet 1 12/06/2015 at Unknown time  . Ascorbic Acid (VITAMIN C) 100 MG tablet Take 100 mg by mouth daily.     12/06/2015 at Unknown time  . aspirin 81 MG tablet Take 81 mg by mouth daily.   12/06/2015 at 0800  . Blood Glucose Monitoring Suppl (ONE TOUCH ULTRA SYSTEM KIT) W/DEVICE KIT 1 kit by Does not apply route once. 1 each 0 12/06/2015 at Unknown time  . carvedilol (COREG) 12.5 MG tablet Take 1 tablet (12.5 mg total) by mouth 2 (two) times daily with a meal.   12/06/2015 at Unknown time  . Cholecalciferol (VITAMIN D3) 1000 UNITS CAPS Take 1 capsule by mouth 2 (two) times daily.     12/06/2015 at Unknown time  . cyclobenzaprine (FLEXERIL) 5 MG tablet Take 1-2 tablets (5-10 mg total) by mouth 2 (two) times daily as needed  for muscle spasms. 40 tablet 0 prn at prn  . diclofenac sodium (VOLTAREN) 1 % GEL Apply 1 application topically 3 (three) times daily. 1 Tube 1 prn at prn  . dorzolamide-timolol (COSOPT) 22.3-6.8 MG/ML ophthalmic solution Place 1 drop into both eyes 3 (three) times daily.     12/06/2015 at Unknown time  . esomeprazole (NEXIUM) 40 MG capsule Take 1 capsule (40 mg total) by mouth daily. 30 capsule 3 prn at prn  . furosemide (LASIX) 40 MG tablet Take 1 tablet (40 mg total) by mouth 2 (two) times daily. 180 tablet 2 12/06/2015 at Unknown time  . glucose blood test strip Check sugars once daily 100 each 11 12/06/2015 at Unknown time  . hydrOXYzine (ATARAX/VISTARIL) 25 MG tablet Take 1 tablet (25 mg total) by mouth 2 (two) times daily as needed.   prn at prn  . lisinopril (PRINIVIL,ZESTRIL) 10 MG tablet Take 5 mg by mouth daily.   12/06/2015 at Unknown time  . meclizine (ANTIVERT) 25 MG tablet Take 1 tablet (25 mg total) by mouth 3 (three) times daily as needed. 30 tablet 1 prn at prn  . Multiple Vitamin (MULTIVITAMIN) tablet Take 1 tablet by mouth daily.     12/06/2015 at Unknown time  . NEOMYCIN-POLYMYXIN-HYDROCORTISONE (CORTISPORIN) 1 % SOLN otic solution Apply 1-2 drops to toe BID after soaking 10 mL 1 12/06/2015 at Unknown time  . polyethylene glycol powder (GLYCOLAX/MIRALAX) powder Take 17 g by mouth daily as needed. 3350 g 1 Past Week at Unknown time  . pravastatin (PRAVACHOL) 80 MG tablet Take 1 tablet by mouth  every evening 90 tablet 2 12/06/2015 at Unknown time  . sevelamer carbonate (RENVELA) 800 MG tablet Take 800 mg by mouth 3 (three) times daily with meals.   12/06/2015 at Unknown time  . sodium bicarbonate 650 MG tablet Take 1 tablet (650 mg total) by mouth 3 (three) times daily.   12/06/2015 at Unknown time  . sucralfate (CARAFATE) 1 g tablet Take 1 tablet (1 g total) by mouth daily. 30 tablet 0 Past Week at Unknown time  . travoprost, benzalkonium, (TRAVATAN) 0.004 % ophthalmic solution Place 1 drop into  both eyes at bedtime.     12/06/2015 at Unknown time  . traZODone (DESYREL) 50 MG tablet Take 50 mg by mouth at bedtime as needed.    12/06/2015 at Unknown time  . vitamin E 100 UNIT capsule Take 400 Units by mouth daily.    12/06/2015 at Unknown time  . cinacalcet (SENSIPAR) 30 MG  tablet Take 30 mg by mouth daily. Reported on 12/07/2015   Not Taking at Unknown time   Scheduled: . amLODipine  10 mg Oral Daily  . aspirin  81 mg Oral Daily  . carvedilol  12.5 mg Oral BID WC  . cholecalciferol  1,000 Units Oral BID  . cinacalcet  30 mg Oral Daily  . dorzolamide-timolol  1 drop Both Eyes TID  . furosemide  40 mg Oral BID  . heparin  5,000 Units Subcutaneous 3 times per day  . insulin aspart  0-5 Units Subcutaneous QHS  . insulin aspart  0-9 Units Subcutaneous TID WC  . latanoprost  1 drop Both Eyes QHS  . lisinopril  5 mg Oral Daily  . neomycin-polymyxin-hydrocortisone  2 drop Both Ears 3 times per day  . [START ON 12/08/2015] oseltamivir  30 mg Oral Q M,W,F-1800  . pantoprazole  40 mg Oral Daily  . pravastatin  80 mg Oral Daily  . sevelamer carbonate  800 mg Oral TID WC  . sodium bicarbonate  650 mg Oral TID  . sucralfate  1 g Oral Daily    ROS: History obtained from the patient  General ROS: negative for - chills, fatigue, fever, night sweats, weight gain or weight loss Psychological ROS: negative for - behavioral disorder, hallucinations, memory difficulties, mood swings or suicidal ideation Ophthalmic ROS: negative for - blurry vision, double vision, eye pain or loss of vision ENT ROS: negative for - epistaxis, nasal discharge, oral lesions, sore throat, tinnitus  Allergy and Immunology ROS: negative for - hives or itchy/watery eyes Hematological and Lymphatic ROS: negative for - bleeding problems, bruising or swollen lymph nodes Endocrine ROS: negative for - galactorrhea, hair pattern changes, polydipsia/polyuria or temperature intolerance Respiratory ROS: negative for - cough,  hemoptysis, shortness of breath or wheezing Cardiovascular ROS: negative for - chest pain, dyspnea on exertion, edema or irregular heartbeat Gastrointestinal ROS: as noted in HPI Genito-Urinary ROS: negative for - dysuria, hematuria, incontinence or urinary frequency/urgency Musculoskeletal ROS: negative for - joint swelling or muscular weakness Neurological ROS: as noted in HPI Dermatological ROS: negative for rash and skin lesion changes  Physical Examination: Blood pressure 120/54, pulse 63, temperature 98.1 F (36.7 C), temperature source Oral, resp. rate 18, height _0  (1.6 m), weight 68 kg (149 lb 14.6 oz), SpO2 100 %.  HEENT-  Normocephalic, no lesions, without obvious abnormality.  Normal external eye and conjunctiva.  Normal TM's bilaterally.  Normal auditory canals and external ears. Normal external nose, mucus membranes and septum.  Normal pharynx. Cardiovascular- S1, S2 normal, pulses palpable throughout   Lungs- chest clear, no wheezing, rales, normal symmetric air entry Abdomen- soft, non-tender; bowel sounds normal; no masses,  no organomegaly Extremities- no edema Lymph-no adenopathy palpable Musculoskeletal-no joint tenderness, deformity or swelling Skin-warm and dry, no hyperpigmentation, vitiligo, or suspicious lesions  Neurological Examination Mental Status: Alert, oriented, thought content appropriate.  Speech fluent without evidence of aphasia.  Able to follow 3 step commands without difficulty. Cranial Nerves: II: Discs flat bilaterally; Visual fields grossly normal, pupils equal, round, reactive to light and accommodation III,IV, VI: ptosis not present, extra-ocular motions intact bilaterally V,VII: smile symmetric, facial light touch sensation normal bilaterally VIII: hearing normal bilaterally IX,X: gag reflex present XI: bilateral shoulder shrug XII: midline tongue extension Motor: Right : Upper extremity   5/5    Left:     Upper extremity   5/5  Lower  extremity   5/5     Lower extremity  5/5 Tone and bulk:normal tone throughout; no atrophy noted Sensory: Pinprick and light touch intact throughout, bilaterally Deep Tendon Reflexes: 2+ in the upper extremities, trace at the knees and absent at the ankles Plantars: Right: downgoing   Left: downgoing Cerebellar: Normal finger-to-nose and normal heel-to-shin testing bilaterally Gait: not tested due to safety concerns   Laboratory Studies:   Basic Metabolic Panel:  Recent Labs Lab 12/07/15 0012 12/07/15 0536  NA 139 140  K 4.4 4.7  CL 102 104  CO2 25 27  GLUCOSE 103* 94  BUN 46* 51*  CREATININE 6.62* 6.99*  CALCIUM 9.3 9.4    Liver Function Tests: No results for input(s): AST, ALT, ALKPHOS, BILITOT, PROT, ALBUMIN in the last 168 hours. No results for input(s): LIPASE, AMYLASE in the last 168 hours. No results for input(s): AMMONIA in the last 168 hours.  CBC:  Recent Labs Lab 12/07/15 0012 12/07/15 0536  WBC 7.5 6.6  HGB 11.6* 11.6*  HCT 35.8 35.5  MCV 82.8 82.2  PLT 139* 143*    Cardiac Enzymes:  Recent Labs Lab 12/07/15 0012 12/07/15 0536 12/07/15 1135  TROPONINI <0.03 <0.03 <0.03    BNP: Invalid input(s): POCBNP  CBG:  Recent Labs Lab 12/07/15 0507 12/07/15 0742 12/07/15 1123  GLUCAP 89 79 83    Microbiology: Results for orders placed or performed during the hospital encounter of 12/06/15  MRSA PCR Screening     Status: None   Collection Time: 12/07/15  6:55 AM  Result Value Ref Range Status   MRSA by PCR NEGATIVE NEGATIVE Final    Comment:        The GeneXpert MRSA Assay (FDA approved for NASAL specimens only), is one component of a comprehensive MRSA colonization surveillance program. It is not intended to diagnose MRSA infection nor to guide or monitor treatment for MRSA infections.     Coagulation Studies: No results for input(s): LABPROT, INR in the last 72 hours.  Urinalysis:  Recent Labs Lab 12/07/15 0118   COLORURINE STRAW*  LABSPEC 1.005  PHURINE 9.0*  GLUCOSEU 50*  HGBUR 1+*  BILIRUBINUR NEGATIVE  KETONESUR NEGATIVE  PROTEINUR 30*  NITRITE NEGATIVE  LEUKOCYTESUR NEGATIVE    Lipid Panel:     Component Value Date/Time   CHOL 117 08/04/2014 0912   TRIG 76.0 08/04/2014 0912   HDL 47.00 08/04/2014 0912   CHOLHDL 2 08/04/2014 0912   VLDL 15.2 08/04/2014 0912   LDLCALC 55 08/04/2014 0912    HgbA1C:  Lab Results  Component Value Date   HGBA1C 4.8 08/04/2014    Urine Drug Screen:  No results found for: LABOPIA, COCAINSCRNUR, LABBENZ, AMPHETMU, THCU, LABBARB  Alcohol Level: No results for input(s): ETH in the last 168 hours.  Other results: EKG: sinus rhythm at 64 bpm.  Imaging: Ct Head Wo Contrast  12/07/2015  CLINICAL DATA:  Syncope. Collapsed at work with loss of consciousness. Dialysis today. EXAM: CT HEAD WITHOUT CONTRAST TECHNIQUE: Contiguous axial images were obtained from the base of the skull through the vertex without intravenous contrast. COMPARISON:  MRI brain 11/28/2014.  CT head 01/17/2013. FINDINGS: Mild cerebral atrophy. Mild ventricular dilatation likely due to central atrophy. Patchy low-attenuation changes in the deep white matter probably representing small vessel ischemic change. No mass effect or midline shift. No abnormal extra-axial fluid collections. Gray-white matter junctions are distinct. Basal cisterns are not effaced. No evidence of acute intracranial hemorrhage. No depressed skull fractures. Mucosal thickening throughout the paranasal sinuses. Opacification and sclerosis in some of the  right mastoid air cells. Vascular calcifications. IMPRESSION: No acute intracranial abnormalities. Mild chronic atrophy and small vessel ischemic changes. Electronically Signed   By: Lucienne Capers M.D.   On: 12/07/2015 00:56   Dg Chest Port 1 View  12/07/2015  CLINICAL DATA:  Syncope while at work. EXAM: PORTABLE CHEST 1 VIEW COMPARISON:  07/10/2010 FINDINGS: Lower lung  volumes from prior exam. The cardiomediastinal contours are normal. The lungs are clear. Pulmonary vasculature is normal. No consolidation, pleural effusion, or pneumothorax. No acute osseous abnormalities are seen. IMPRESSION: Low lung volumes without acute process. Electronically Signed   By: Jeb Levering M.D.   On: 12/07/2015 00:38     Assessment/Plan: 65 year old female with multiple medical problems presenting after a syncopal episode with some associated jerking.  Patient without a history of seizures.  Neurological examination nonfocal.  Head CT personally reviewed and shows no acute changes.  Suspect extremity jerking due to hypoperfusion.  Patient currently not orthostatic.  Influenza A positive.  Further work up recommended.    Recommendations: 1.  MRI of the brain without contrast 2.  EEG.  May be performed as an outpatient.   3.  Anticonvulsant therapy not indicated at this time.    Alexis Goodell, MD Neurology [phone] 12/07/2015, 3:38 PM

## 2015-12-07 NOTE — ED Provider Notes (Signed)
[facility] Emergency Department Provider Note  ____________________________________________  Time seen: Approximately 12:43 AM  I have reviewed the triage vital signs and the nursing notes.   HISTORY  Chief Complaint Loss of Consciousness    HPI Kristen Kirk is a 65 y.o. female who presents to the ED from work via EMS with a chief complaint of syncope.Patient has a history of hypertension, diabetes, end-stage renal disease on hemodialysis Monday/Wednesday/Friday. 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 Patient reports she was standing, felt nauseated and passed out. Per EMS, coworkers reported that she was shaking and she was on the floor. Rhythm strip per EMS demonstrates a run of bigeminy with a heart rate of 36. Patient states she was dialyzed for the full time yesterday without difficulty. Spouse has recently been sick with the flu, and patient had been feeling more fatigued than usual yesterday. She denies associated fever, chills, chest pain, shortness of breath, abdominal pain, vomiting, diarrhea. Denies recent travel or trauma. Nothing makes her symptoms better or worse.   Past Medical History  Diagnosis Date  . CKD (chronic kidney disease) stage 5, GFR less than 15 ml/min (HCC)     per [facility] renal (Dr. Alfonse Alpers) considering transplant, referred for HD access and initiation  . Diabetes mellitus type II     currently diet controlled since losing weight  . HLD (hyperlipidemia)   . GERD (gastroesophageal reflux disease)   . HTN (hypertension) pre-2003  . Urge incontinence     Dr. Matilde Sprang to do PTNS  . Vitamin D deficiency   . Glaucoma     L>R  . DJD (degenerative joint disease)   . Osteoarthritis   . Vaginal atrophy     vagifem caused spotting, normal TVS  . Anemia in chronic kidney disease     s/p aranesp 11/2013 now starting epo with HD 06/2014    Patient Active Problem List   Diagnosis Date Noted  . Cardiogenic  Syncope 12/07/2015  . ESRD on hemodialysis ([place]) 12/07/2015  . T2DM (type 2 diabetes mellitus) ([place]) 12/07/2015  . URI (upper respiratory infection) 12/07/2015  . Abdominal pain, epigastric 10/29/2015  . Lower back pain 10/29/2013  . Insomnia 01/14/2013  . Gassiness 08/22/2012  . Subacromial bursitis 08/22/2012  . Xerostomia 01/29/2012  . Healthcare maintenance 07/20/2011  . Vaginal atrophy 04/17/2011  . ESRD (end stage renal disease) on dialysis ([place])   . Controlled type 2 diabetes mellitus with diabetic nephropathy ([place])   . HLD (hyperlipidemia)   . GERD (gastroesophageal reflux disease)   . HTN (hypertension)   . Urge incontinence   . SYNCOPE 07/11/2010  . Encopresis(307.7) 10/26/2009  . Vitamin D deficiency 05/19/2009  . MUSCLE CRAMPS 02/22/2009  . ALOPECIA 10/05/2008  . VERTIGO 07/31/2008  . OSTEOARTHRITIS- EARLY T11-12, T12-L1  4/05 01/03/2007  . GLAUCOMA NOS 10/03/1999    Past Surgical History  Procedure Laterality Date  . Tubal ligation  1990s    bilateral  . Bladder suspension  2011    for stress incontinence-Dr. MacDiarmid  . Refractive surgery    . Left eye laser surgery  06/2011  . Colonoscopy  02/04/2009    normal, rec rpt 10 yrs    No current outpatient prescriptions on file.  Allergies Brimonidine and Shellfish-derived products  Family History  Problem Relation Age of Onset  . Stroke Brother   . Hyperlipidemia Brother   . Hypertension Brother   . Hyperlipidemia Brother   . Hypertension  Brother   . Hyperlipidemia Sister   . Hypertension Sister   . Hyperlipidemia Sister   . Hypertension Sister   . Diabetes Mother   . Heart failure Mother   . Heart failure Maternal Grandfather     Social History Social History  Substance Use Topics  . Smoking status: Former Research scientist (life sciences)  . Smokeless tobacco: Never Used     Comment: Remote  . Alcohol Use: No    Review of Systems  Constitutional: No fever/chills. Eyes: No visual changes. ENT: No sore  throat. Cardiovascular: Denies chest pain. Respiratory: Denies shortness of breath. Gastrointestinal: No abdominal pain.  No nausea, no vomiting.  No diarrhea.  No constipation. Genitourinary: Negative for dysuria. Musculoskeletal: Negative for back pain. Skin: Negative for rash. Neurological: Positive for syncope. Negative for headaches, focal weakness or numbness.  10-point ROS otherwise negative.  ____________________________________________   PHYSICAL EXAM:  VITAL SIGNS: ED Triage Vitals  Enc Vitals Group     BP 12/07/15 0000 154/88 mmHg     Pulse Rate 12/07/15 0000 71     Resp 12/07/15 0000 17     Temp 12/07/15 0000 98.7 F (37.1 C)     Temp Source 12/07/15 0000 Oral     SpO2 12/07/15 0000 99 %     Weight 12/07/15 0000 149 lb 14.6 oz (68 kg)     Height 12/07/15 0000 5\' 3"  (1.6 m)     Head Cir --      Peak Flow --      Pain Score 12/07/15 0002 0     Pain Loc --      Pain Edu? --      Excl. in [place]? --     Constitutional: Alert and oriented. Well appearing and in no acute distress. Eyes: Conjunctivae are normal. PERRL. EOMI. Head: Atraumatic. Nose: No congestion/rhinnorhea. Mouth/Throat: Mucous membranes are moist.  Oropharynx non-erythematous. Neck: No stridor.  No cervical spine tenderness to palpation.  No carotid bruits. Cardiovascular: Normal rate, regular rhythm. Grossly normal heart sounds.  Good peripheral circulation. Respiratory: Normal respiratory effort.  No retractions. Lungs CTAB. Gastrointestinal: Soft and nontender. No distention. No abdominal bruits. No CVA tenderness. Musculoskeletal: No lower extremity tenderness nor edema.  No joint effusions. Neurologic:  Normal speech and language. Alert and oriented 4. CN II-XII grossly intact. No gross focal neurologic deficits are appreciated.  Skin:  Skin is warm, dry and intact. No rash noted. Psychiatric: Mood and affect are normal. Speech and behavior are  normal.  ____________________________________________   LABS (all labs ordered are listed, but only abnormal results are displayed)  Labs Reviewed  BASIC METABOLIC PANEL - Abnormal; Notable for the following:    Glucose, Bld 103 (*)    BUN 46 (*)    Creatinine, Ser 6.62 (*)    GFR calc non Af Amer 6 (*)    GFR calc Af Amer 7 (*)    All other components within normal limits  CBC - Abnormal; Notable for the following:    Hemoglobin 11.6 (*)    RDW 16.2 (*)    Platelets 139 (*)    All other components within normal limits  URINALYSIS COMPLETEWITH MICROSCOPIC ([facility] ONLY) - Abnormal; Notable for the following:    Color, Urine STRAW (*)    APPearance CLEAR (*)    Glucose, UA 50 (*)    Hgb urine dipstick 1+ (*)    pH 9.0 (*)    Protein, ur 30 (*)    Squamous Epithelial / LPF  0-5 (*)    All other components within normal limits  INFLUENZA PANEL BY PCR (TYPE A & B, H1N1) - Abnormal; Notable for the following:    Influenza A By PCR POSITIVE (*)    All other components within normal limits  CBC - Abnormal; Notable for the following:    Hemoglobin 11.6 (*)    RDW 16.0 (*)    Platelets 143 (*)    All other components within normal limits  BASIC METABOLIC PANEL - Abnormal; Notable for the following:    BUN 51 (*)    Creatinine, Ser 6.99 (*)    GFR calc non Af Amer 6 (*)    GFR calc Af Amer 6 (*)    All other components within normal limits  MRSA PCR SCREENING  TROPONIN I  TROPONIN I  TSH  GLUCOSE, CAPILLARY  TROPONIN I  HEMOGLOBIN A1C  CBG MONITORING, ED   ____________________________________________  EKG  ED ECG REPORT I, SUNG,JADE J, the attending physician, personally viewed and interpreted this ECG.   Date: 12/07/2015  EKG Time: 0012  Rate: 64  Rhythm: normal EKG, normal sinus rhythm  Axis: Normal  Intervals:none  ST&T Change: Nonspecific  ____________________________________________  RADIOLOGY  Chest x-ray (viewed by me, interpreted per Dr.  Marisue Humble): Low lung volumes without acute process. ____________________________________________   PROCEDURES  Procedure(s) performed: None  Critical Care performed: No  ____________________________________________   INITIAL IMPRESSION / ASSESSMENT AND PLAN / ED COURSE  Pertinent labs & imaging results that were available during my care of the patient were reviewed by me and considered in my medical decision making (see chart for details).  65 year old female with a history of hypertension, diabetes, ESRD on HD who presents with syncopal episode. Run of bigeminy noted per EMS rhythm strip. Suspect cardiac etiology. Will obtain screening lab work, CT head, administer antiemetic; anticipate hospital admission. ____________________________________________   FINAL CLINICAL IMPRESSION(S) / ED DIAGNOSES  Final diagnoses:  Syncope, unspecified syncope type  ESRD (end stage renal disease) on dialysis ([place])  Controlled type 2 diabetes mellitus with diabetic nephropathy, without long-term current use of insulin (HCC)  HLD (hyperlipidemia)  Essential hypertension  Gastroesophageal reflux disease, esophagitis presence not specified      Paulette Blanch, MD 12/07/15 [id number]

## 2015-12-07 NOTE — Consult Note (Signed)
Cardiology Consult    Patient ID: Kristen Kirk MRN: [id number], DOB/AGE: 06-27-1951   Admit date: 12/06/2015 Date of Consult: 12/07/2015  Primary Physician: Ria Bush, MD Primary Cardiologist: new Requesting Provider: A. Oseni, MD  Patient Profile    65 y/o ? w/o prior cardiac hx who was admitted overnight 2/2 syncope.  Past Medical History   Past Medical History  Diagnosis Date  . ESRD (end stage renal disease) ([place])     a. Followed @ [facility];  b. On dialysis since late 2015 (MWF).  . Diabetes mellitus type II     a. Currently diet controlled since losing weight.  Marland Kitchen HLD (hyperlipidemia)   . GERD (gastroesophageal reflux disease)   . Essential hypertension   . History of Urge Incontinence   . Vitamin D deficiency   . Glaucoma     a. L>R  . DJD (degenerative joint disease)   . Osteoarthritis   . Vaginal atrophy     a. vagifem caused spotting, normal TVS  . Anemia in chronic kidney disease     a. s/p aranesp 11/2013 now starting epo with HD 06/2014  . Syncope     a. 12/2015.  Marland Kitchen History of echocardiogram     a. 01/2015 Echo [facility]): EF ?55%, triv MR, mildly dil LA, nl PV.  Marland Kitchen History of stress test     a. 03/2013 Myoview [facility]): EF 63%, no ischemia.    Past Surgical History  Procedure Laterality Date  . Tubal ligation  1990s    bilateral  . Bladder suspension  2011    for stress incontinence-Dr. MacDiarmid  . Refractive surgery    . Left eye laser surgery  06/2011  . Colonoscopy  02/04/2009    normal, rec rpt 10 yrs     Allergies  Allergies  Allergen Reactions  . Brimonidine     Other reaction(s): Other (See Comments) eye redness  . Shellfish-Derived Products Swelling    History of Present Illness    65 y/o ? with the above complex PMH.  She has a long h/o HTN and development of CKD  ESRD.  She's been on dialysis for just over a year and is followed by nephrology @ [facility].  She has no prior cardiac history, though she has undergone echo(2016) and stress  testing(2014) - both nl - in the past related to a w/u for possible renal transplant.  She was in her usoh until this past weekend when she developed some sinus congestion and cough in the absence of fevers/chills/malaise/myalgias.  Her husband was recently hosptiatliized @ the [facility] with flu.  She didn't think too much of it and had a nl dialysis session on the AM of 3/6.  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  She denies c/p, dyspnea, palpitations, or diaphoresis.  She turned from her work station to walk to the bathroom but suddenly lost consciousness, falling to the floor.  This was witnessed by her co-workers and she says that she was told that they thought she was twitching and her tongue was moving "like she was having a seizure."  She doesn't know how much time passed before she regained consciousness, but she believes it was within a minute based on what her co-workers said.  Upon regaining consciousness, she continued to feel nauseated and LH.  She was assisted to the bathroom and upon arriving their, she vomited x 1 into a garbage can.  She continued to feel poorly and EMS was called.  Per notes, during Tx to [facility], she was intermittently bradycardic with rates in the 30's.  In the ED, labs were relatively unremarkable except for her creat, which is chronically elevated.  She was admitted for further eval.  Overnight, there have been no significant events on tele.  She did test + for influenza A by PCR and is on droplet precautions and has received tamiflu.  Her only complaint this AM is that of mild sinus congestion.  She has had no further nausea, vomiting, or LH.  Inpatient Medications    . amLODipine  10 mg Oral Daily  . aspirin  81 mg Oral Daily  . carvedilol  12.5 mg Oral BID WC  . cholecalciferol  1,000 Units Oral BID    . cinacalcet  30 mg Oral Daily  . dorzolamide-timolol  1 drop Both Eyes TID  . furosemide  40 mg Oral BID  . heparin  5,000 Units Subcutaneous 3 times per day  . insulin aspart  0-5 Units Subcutaneous QHS  . insulin aspart  0-9 Units Subcutaneous TID WC  . latanoprost  1 drop Both Eyes QHS  . lisinopril  5 mg Oral Daily  . neomycin-polymyxin-hydrocortisone  2 drop Both Ears 3 times per day  . oseltamivir  75 mg Oral BID  . pantoprazole  40 mg Oral Daily  . pravastatin  80 mg Oral Daily  . sevelamer carbonate  800 mg Oral TID WC  . sodium bicarbonate  650 mg Oral TID  . sucralfate  1 g Oral Daily    Family History    Family History  Problem Relation Age of Onset  . Stroke Brother   . Hyperlipidemia Brother   . Hypertension Brother   . Hyperlipidemia Brother   . Hypertension Brother   . Hyperlipidemia Sister   . Hypertension Sister   . Hyperlipidemia Sister   . Hypertension Sister   . Diabetes Mother   . Heart failure Mother     died @ 48  . Heart failure Maternal Grandfather   . Other Father     she did not know her father.    Social History    Social History   Social History  . Marital Status: Married    Spouse Name: N/A  . Number of Children: 1  . Years of Education: N/A   Occupational History  . [employer]-lab assistant   .     Social History Main Topics  . Smoking status: Former Research scientist (life sciences)  . Smokeless tobacco: Never Used     Comment: Remote- quit ~ late 1980's.  . Alcohol Use: No  . Drug Use: No  . Sexual Activity:    Partners: Male   Other Topics Concern  . Not on file   Social History Narrative   Lives with husband in [place].   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  Cardiovascular:  No chest pain, dyspnea on  exertion, edema, orthopnea, palpitations, paroxysmal nocturnal dyspnea. +++ presyncope and syncope as outlined above. Dermatological: No rash, lesions/masses Respiratory: +++ cough and sinus congestion, no  dyspnea Urologic: No hematuria, dysuria Abdominal:   +++ nausea, vomiting prior to admission.  No diarrhea, bright red blood per rectum, melena, or hematemesis Neurologic:  No visual changes, wkns, changes in mental status. All other systems reviewed and are otherwise negative except as noted above.  Physical Exam    Blood pressure 120/54, pulse 63, temperature 98.1 F (36.7 C), temperature source Oral, resp. rate 18, height 5\' 3"  (1.6 m), weight 149 lb 14.6 oz (68 kg), SpO2 100 %.  General: Pleasant, NAD Psych: Normal affect. Neuro: Alert and oriented X 3. Moves all extremities spontaneously. HEENT: Normal  Neck: Supple without bruits or JVD. Lungs:  Resp regular and unlabored, CTA. Heart: RRR no s3, s4, or murmurs. Abdomen: Soft, non-tender, non-distended, BS + x 4.  Extremities: No clubbing, cyanosis or edema. DP/PT/Radials 2+ and equal bilaterally.  Left AVF with + bruit/thrill.  Labs     Recent Labs  12/07/15 0012 12/07/15 0536 12/07/15 1135  TROPONINI <0.03 <0.03 <0.03   Lab Results  Component Value Date   WBC 6.6 12/07/2015   HGB 11.6* 12/07/2015   HCT 35.5 12/07/2015   MCV 82.2 12/07/2015   PLT 143* 12/07/2015    Recent Labs Lab 12/07/15 0536  NA 140  K 4.7  CL 104  CO2 27  BUN 51*  CREATININE 6.99*  CALCIUM 9.4  GLUCOSE 94   Lab Results  Component Value Date   CHOL 117 08/04/2014   HDL 47.00 08/04/2014   LDLCALC 55 08/04/2014   TRIG 76.0 08/04/2014     Radiology Studies    Ct Head Wo Contrast  12/07/2015  CLINICAL DATA:  Syncope. Collapsed at work with loss of consciousness. Dialysis today. EXAM: CT HEAD WITHOUT CONTRAST TECHNIQUE: Contiguous axial images were obtained from the base of the skull through the vertex without intravenous contrast.  COMPARISON:  MRI brain 11/28/2014.  CT head 01/17/2013. FINDINGS: Mild cerebral atrophy. Mild ventricular dilatation likely due to central atrophy. Patchy low-attenuation changes in the deep white matter probably representing small vessel ischemic change. No mass effect or midline shift. No abnormal extra-axial fluid collections. Gray-white matter junctions are distinct. Basal cisterns are not effaced. No evidence of acute intracranial hemorrhage. No depressed skull fractures. Mucosal thickening throughout the paranasal sinuses. Opacification and sclerosis in some of the right mastoid air cells. Vascular calcifications. IMPRESSION: No acute intracranial abnormalities. Mild chronic atrophy and small vessel ischemic changes. Electronically Signed   By: Lucienne Capers M.D.   On: 12/07/2015 00:56   Dg Chest Port 1 View  12/07/2015  CLINICAL DATA:  Syncope while at work. EXAM: PORTABLE CHEST 1 VIEW COMPARISON:  07/10/2010 FINDINGS: Lower lung volumes from prior exam. The cardiomediastinal contours are normal. The lungs are clear. Pulmonary vasculature is normal. No consolidation, pleural effusion, or pneumothorax. No acute osseous abnormalities are seen. IMPRESSION: Low lung volumes without acute process. Electronically Signed   By: Jeb Levering M.D.   On: 12/07/2015 00:38    ECG & Cardiac Imaging    RSR, 65, LAD, LAFB, abnl R progression (V2).  Assessment & Plan    1.  Syncope:  Pt presented late on 3/6 after a syncopal spell at work that occurred while standing and was preceded by nausea and lightheadedness.  Co-workers apparently questioned shaking/sz like activity during spell.  Duration unknown but felt to be somewhere around a minute per pt.  Nausea and lightheaded persisted following syncope and was then followed by vomiting.  She was reportedly bradycardic with rates in  the 30's at times on tele en route to [facility].  She has r/o for MI and has had no significant bradycardia, heart block, tachy or  brady arrhythmias on tele.  Lytes and TSH wnl.  Echo is ordered and pending.  Provided that EF is nl (it was in 2016) and no events found on tele, would arrange for 30 day event monitor.  Her symptoms/presentation sounds to be most consistent with vasovagal syncope.  If any further bradycardia seen, we will need to back off or discontinue  blocker.  2.  Essential HTN: stable.  3.  ESRD: MWF dialysis.  4. Influenza A:  On tamiflu.  ? Role in vasovagal syncope.  Signed, Murray Hodgkins, NP 12/07/2015, 12:38 PM

## 2015-12-07 NOTE — Progress Notes (Signed)
[facility] [place] NAME: Kristen Kirk    MR#:  [id number]  DATE OF BIRTH:  Feb 12, 1951  SUBJECTIVE:  CHIEF COMPLAINT:   Chief Complaint  Patient presents with  . Loss of Consciousness  feels tired, no other c/o, family at bedside.  REVIEW OF SYSTEMS:  Review of Systems  Constitutional: Positive for malaise/fatigue. Negative for fever, weight loss and diaphoresis.  HENT: Negative for ear discharge, ear pain, hearing loss, nosebleeds, sore throat and tinnitus.   Eyes: Negative for blurred vision and pain.  Respiratory: Negative for cough, hemoptysis, shortness of breath and wheezing.   Cardiovascular: Negative for chest pain, palpitations, orthopnea and leg swelling.  Gastrointestinal: Negative for heartburn, nausea, vomiting, abdominal pain, diarrhea, constipation and blood in stool.  Genitourinary: Negative for dysuria, urgency and frequency.  Musculoskeletal: Negative for myalgias and back pain.  Skin: Negative for itching and rash.  Neurological: Positive for dizziness and weakness. Negative for tingling, tremors, focal weakness, seizures and headaches.  Psychiatric/Behavioral: Negative for depression. The patient is not nervous/anxious.     DRUG ALLERGIES:   Allergies  Allergen Reactions  . Brimonidine     Other reaction(s): Other (See Comments) eye redness  . Shellfish-Derived Products Swelling   VITALS:  Blood pressure 120/54, pulse 63, temperature 98.1 F (36.7 C), temperature source Oral, resp. rate 18, height 5\' 3"  (1.6 m), weight 68 kg (149 lb 14.6 oz), SpO2 100 %. PHYSICAL EXAMINATION:  Physical Exam  Constitutional: She is oriented to person, place, and time and well-developed, well-nourished, and in no distress.  HENT:  Head: Normocephalic and atraumatic.  Eyes: Conjunctivae and EOM are normal. Pupils are equal, round, and reactive to light.  Neck: Normal range of motion. Neck supple. No tracheal  deviation present. No thyromegaly present.  Cardiovascular: Normal rate, regular rhythm and normal heart sounds.   Pulmonary/Chest: Effort normal and breath sounds normal. No respiratory distress. She has no wheezes. She exhibits no tenderness.  Abdominal: Soft. Bowel sounds are normal. She exhibits no distension. There is no tenderness.  Musculoskeletal: Normal range of motion.  Neurological: She is alert and oriented to person, place, and time. No cranial nerve deficit.  Skin: Skin is warm and dry. No rash noted.  Psychiatric: Mood and affect normal.   LABORATORY PANEL:   CBC  Recent Labs Lab 12/07/15 0536  WBC 6.6  HGB 11.6*  HCT 35.5  PLT 143*   ------------------------------------------------------------------------------------------------------------------ Chemistries   Recent Labs Lab 12/07/15 0536  NA 140  K 4.7  CL 104  CO2 27  GLUCOSE 94  BUN 51*  CREATININE 6.99*  CALCIUM 9.4   RADIOLOGY:  Ct Head Wo Contrast  12/07/2015  CLINICAL DATA:  Syncope. Collapsed at work with loss of consciousness. Dialysis today. EXAM: CT HEAD WITHOUT CONTRAST TECHNIQUE: Contiguous axial images were obtained from the base of the skull through the vertex without intravenous contrast. COMPARISON:  MRI brain 11/28/2014.  CT head 01/17/2013. FINDINGS: Mild cerebral atrophy. Mild ventricular dilatation likely due to central atrophy. Patchy low-attenuation changes in the deep white matter probably representing small vessel ischemic change. No mass effect or midline shift. No abnormal extra-axial fluid collections. Gray-white matter junctions are distinct. Basal cisterns are not effaced. No evidence of acute intracranial hemorrhage. No depressed skull fractures. Mucosal thickening throughout the paranasal sinuses. Opacification and sclerosis in some of the right mastoid air cells. Vascular calcifications. IMPRESSION: No acute intracranial abnormalities. Mild chronic atrophy and small vessel  ischemic changes.  Electronically Signed   By: Lucienne Capers M.D.   On: 12/07/2015 00:56   Dg Chest Port 1 View  12/07/2015  CLINICAL DATA:  Syncope while at work. EXAM: PORTABLE CHEST 1 VIEW COMPARISON:  07/10/2010 FINDINGS: Lower lung volumes from prior exam. The cardiomediastinal contours are normal. The lungs are clear. Pulmonary vasculature is normal. No consolidation, pleural effusion, or pneumothorax. No acute osseous abnormalities are seen. IMPRESSION: Low lung volumes without acute process. Electronically Signed   By: Jeb Levering M.D.   On: 12/07/2015 00:38   ASSESSMENT AND PLAN:  1). Cardiogenic/vasovagal syncope - likely - Patient had a syncope after nausea. She denies any prior dizziness. She has not had a history of syncope in the past. EMS reported the patient was bradycardic and had bigeminy on EKG prior to arrival but in the ED, patient's EKG shows normal sinus rhythm. - serial troponin negative.  - normal TSH and pending 2-D echo. - Neg orthostatic vitals and continue when necessary Zofran and meclizine. - Ensure fall precautions. - Obtain influenza A and B swabs as patient's husband has the flu. - pt and family giving some concerning story for seizures - foaming in mouth corners, shaking of extremities. Will get Neuro c/s - MRI brain, EEG (may be outpt) depending on Neuro eval - Cardio c/s for eval of cardiac etio.  2). ESRD on hemodialysis - nephrology for hemodialysis.  3). Type 2 diabetes mellitus - Patient is not on home medications. Start sliding scale insulin and monitor glucose levels. - Consistent carbohydrate diet - A1c 4.8 suggestive of diet controlled - excellent sugar control. Doubt she has DM based on this.  4). Positive Influenza - Patient complains of cough that is productive of clear to yellowish sputum. Husband Has Just Presented Discharge after Having Influenza. - + flu swabs and start patient on when necessary guaifenesin. - Continue droplet  isolation  - started Tamiflu     All the records are reviewed and case discussed with Care Management/Social Worker. Management plans discussed with the patient, family and they are in agreement.  CODE STATUS: FULL CODE  TOTAL TIME TAKING CARE OF THIS PATIENT: 25 minutes.   More than 50% of the time was spent in counseling/coordination of care: YES  POSSIBLE D/C IN AM, DEPENDING ON CLINICAL CONDITION. AWAIT mri Brain results.   [facility], Lamont Glasscock M.D on 12/07/2015 at 4:06 PM  Between 7am to 6pm - Pager - [phone]  After 6pm go to [email or web address] - password [email or web address] [facility]  Office  [phone]  CC: Primary care physician; Ria Bush, MD  Note: This dictation was prepared with Dragon dictation along with smaller phrase technology. Any transcriptional errors that result from this process are unintentional.

## 2015-12-07 NOTE — ED Notes (Signed)
Nasal flu swab obtained and sent to lab

## 2015-12-07 NOTE — H&P (Signed)
[facility] at [place] NAME: Kristen Kirk    MR#:  [id number]  DATE OF BIRTH:  1951-02-04  DATE OF ADMISSION:  12/06/2015  PRIMARY CARE PHYSICIAN: Ria Bush, MD   REQUESTING/REFERRING PHYSICIAN: Dr Lurline Hare  CHIEF COMPLAINT:   Chief Complaint  Patient presents with  . Loss of Consciousness   HISTORY OF PRESENT ILLNESS: Kristen Kirk  is a 65 y.o. female with a known history of ESRD on hemodialysis, hyperlipidemia, vertigo, osteoarthritis, GERD, and hypertension and diabetes. She presents to the ED today after she passed out at work. Patient works at [employer] and was put in specimen on a rack when she suddenly became nauseated and collapsed and fell on the floor. There was no head or bodily trauma. Patient is unsure of how long she passed out for but she estimates about 5-10 minutes. Her colleagues in the office called EMS. Patient denies dizziness prior to the fall and also denies any palpitations, chest pain, shortness of breath, abdominal pain, blurry vision or vertigo. She was not reported to have had any jerky movement suggestive of seizures. She denies any urinary or bowel incontinence when she came around. Her appetite has been normal and she hasn't had any preceding fever or changes in bowel or urine habits. She denies any bleeding from any orifice or excessive urination. She reports that she has had a cough for a few days and cough was productive of clear to yellowish sputum. Her husband was recently hospitalized at the [facility] in [place] for the flu. She denies any cardiac history but states that she had a stress test about 2 years ago that was negative. She is on dialysis on Mondays, Wednesday and Fridays and had had dialysis yesterday without any difficulty. She denies use of recreational drugs, tobacco or alcohol.  While being transported to the ED, patient was noted by EMS to have had bradycardia of 30 bpm as well as  bigeminy. On arrival in the ED, EKG showed normal sinus rhythm. CBC was unremarkable and CMP was significant for a BUN of 46 and creatinine of 6.62. Troponin was negative. Patient was given Zofran and Tylenol in the ED and will be admitted for observation due to likely cardiogenic syncope. Wishes to be full code.  PAST MEDICAL HISTORY:   Past Medical History  Diagnosis Date  . CKD (chronic kidney disease) stage 5, GFR less than 15 ml/min (HCC)     per [facility] renal (Dr. Alfonse Alpers) considering transplant, referred for HD access and initiation  . Diabetes mellitus type II     currently diet controlled since losing weight  . HLD (hyperlipidemia)   . GERD (gastroesophageal reflux disease)   . HTN (hypertension) pre-2003  . Urge incontinence     Dr. Matilde Sprang to do PTNS  . Vitamin D deficiency   . Glaucoma     L>R  . DJD (degenerative joint disease)   . Osteoarthritis   . Vaginal atrophy     vagifem caused spotting, normal TVS  . Anemia in chronic kidney disease     s/p aranesp 11/2013 now starting epo with HD 06/2014    PAST SURGICAL HISTORY:  Past Surgical History  Procedure Laterality Date  . Tubal ligation  1990s    bilateral  . Bladder suspension  2011    for stress incontinence-Dr. MacDiarmid  . Refractive surgery    . Left eye laser surgery  06/2011  . Colonoscopy  02/04/2009  normal, rec rpt 10 yrs    SOCIAL HISTORY:  Social History  Substance Use Topics  . Smoking status: Former Research scientist (life sciences)  . Smokeless tobacco: Never Used     Comment: Remote  . Alcohol Use: No    FAMILY HISTORY:  Family History  Problem Relation Age of Onset  . Stroke Brother   . Hyperlipidemia Brother   . Hypertension Brother   . Hyperlipidemia Brother   . Hypertension Brother   . Hyperlipidemia Sister   . Hypertension Sister   . Hyperlipidemia Sister   . Hypertension Sister   . Diabetes Mother   . Heart failure Mother   . Heart failure Maternal Grandfather     DRUG ALLERGIES:  Allergies   Allergen Reactions  . Brimonidine     Other reaction(s): Other (See Comments) eye redness  . Shellfish-Derived Products Swelling    REVIEW OF SYSTEMS:   CONSTITUTIONAL: No fever, fatigue or weakness.  EYES: No blurred or double vision.  EARS, NOSE, AND THROAT: No tinnitus or ear pain.  RESPIRATORY: As in history of present illness CARDIOVASCULAR: No chest pain, orthopnea, edema.  GASTROINTESTINAL: No vomiting, diarrhea or abdominal pain.  GENITOURINARY: No dysuria, hematuria.  ENDOCRINE: No polyuria, nocturia,  HEMATOLOGY: No anemia, easy bruising or bleeding SKIN: No rash or lesion. MUSCULOSKELETAL: No joint pain or arthritis.   NEUROLOGIC: No tingling, numbness, weakness.  PSYCHIATRY: No anxiety or depression.   MEDICATIONS AT HOME:  Prior to Admission medications   Medication Sig Start Date End Date Taking? Authorizing Provider  amLODipine (NORVASC) 10 MG tablet Take 1 tablet (10 mg total) by mouth daily. 12/01/15  Yes Ria Bush, MD  Ascorbic Acid (VITAMIN C) 100 MG tablet Take 100 mg by mouth daily.     Yes Historical Provider, MD  aspirin 81 MG tablet Take 81 mg by mouth daily.   Yes Historical Provider, MD  Blood Glucose Monitoring Suppl (ONE TOUCH ULTRA SYSTEM KIT) W/DEVICE KIT 1 kit by Does not apply route once. 03/02/14  Yes Ria Bush, MD  carvedilol (COREG) 12.5 MG tablet Take 1 tablet (12.5 mg total) by mouth 2 (two) times daily with a meal. 03/09/13  Yes Ria Bush, MD  Cholecalciferol (VITAMIN D3) 1000 UNITS CAPS Take 1 capsule by mouth 2 (two) times daily.     Yes Historical Provider, MD  cyclobenzaprine (FLEXERIL) 5 MG tablet Take 1-2 tablets (5-10 mg total) by mouth 2 (two) times daily as needed for muscle spasms. 09/16/13  Yes Ria Bush, MD  diclofenac sodium (VOLTAREN) 1 % GEL Apply 1 application topically 3 (three) times daily. 09/06/12  Yes Ria Bush, MD  dorzolamide-timolol (COSOPT) 22.3-6.8 MG/ML ophthalmic solution Place 1 drop  into both eyes 3 (three) times daily.     Yes Historical Provider, MD  esomeprazole (NEXIUM) 40 MG capsule Take 1 capsule (40 mg total) by mouth daily. 10/29/15  Yes Ria Bush, MD  furosemide (LASIX) 40 MG tablet Take 1 tablet (40 mg total) by mouth 2 (two) times daily. 11/30/15  Yes Ria Bush, MD  glucose blood test strip Check sugars once daily 04/21/11  Yes Ria Bush, MD  hydrOXYzine (ATARAX/VISTARIL) 25 MG tablet Take 1 tablet (25 mg total) by mouth 2 (two) times daily as needed. 12/12/13  Yes Ria Bush, MD  lisinopril (PRINIVIL,ZESTRIL) 10 MG tablet Take 5 mg by mouth daily. 08/12/15  Yes Historical Provider, MD  meclizine (ANTIVERT) 25 MG tablet Take 1 tablet (25 mg total) by mouth 3 (three) times daily as needed.  10/14/14  Yes Ria Bush, MD  Multiple Vitamin (MULTIVITAMIN) tablet Take 1 tablet by mouth daily.     Yes Historical Provider, MD  NEOMYCIN-POLYMYXIN-HYDROCORTISONE (CORTISPORIN) 1 % SOLN otic solution Apply 1-2 drops to toe BID after soaking 11/08/15  Yes Max T Hyatt, DPM  polyethylene glycol powder (GLYCOLAX/MIRALAX) powder Take 17 g by mouth daily as needed. 01/13/13  Yes Ria Bush, MD  pravastatin (PRAVACHOL) 80 MG tablet Take 1 tablet by mouth  every evening 11/30/15  Yes Ria Bush, MD  sevelamer carbonate (RENVELA) 800 MG tablet Take 800 mg by mouth 3 (three) times daily with meals.   Yes Historical Provider, MD  sodium bicarbonate 650 MG tablet Take 1 tablet (650 mg total) by mouth 3 (three) times daily. 12/12/13  Yes Ria Bush, MD  sucralfate (CARAFATE) 1 g tablet Take 1 tablet (1 g total) by mouth daily. 10/29/15  Yes Ria Bush, MD  travoprost, benzalkonium, (TRAVATAN) 0.004 % ophthalmic solution Place 1 drop into both eyes at bedtime.     Yes Historical Provider, MD  traZODone (DESYREL) 50 MG tablet Take 50 mg by mouth at bedtime as needed.  01/27/13  Yes Historical Provider, MD  vitamin E 100 UNIT capsule Take 400 Units by  mouth daily.    Yes Historical Provider, MD  cinacalcet (SENSIPAR) 30 MG tablet Take 30 mg by mouth daily. Reported on 12/07/2015    Historical Provider, MD      PHYSICAL EXAMINATION:   VITAL SIGNS: Blood pressure 140/63, pulse 60, temperature 98.7 F (37.1 C), temperature source Oral, resp. rate 15, height 5' 3" (1.6 m), weight 68 kg (149 lb 14.6 oz), SpO2 98 %.  GENERAL:  65 y.o.-year-old patient lying in the bed with no acute distress. Alert and oriented x 3. EYES: Pupils equal, round, reactive to light and accommodation. No scleral icterus. Extraocular muscles intact.  HEENT: Head atraumatic, normocephalic. Oropharynx and nasopharynx clear.  NECK:  Supple, no jugular venous distention. No thyroid enlargement, no tenderness.  LUNGS: Normal breath sounds bilaterally, no wheezing, rales,rhonchi or crepitation. No use of accessory muscles of respiration.  CARDIOVASCULAR: S1, S2 normal. No murmurs, rubs, or gallops.  ABDOMEN: Soft, nontender, nondistended. Bowel sounds present. No organomegaly or mass.  EXTREMITIES: No pedal edema, cyanosis, or clubbing.  NEUROLOGIC: Cranial nerves II through XII are intact. Muscle strength 5/5 in all extremities. Sensation intact. Gait not checked.   SKIN: No obvious rash, lesion, or ulcer.   LABORATORY PANEL:   CBC  Recent Labs Lab 12/07/15 0012  WBC 7.5  HGB 11.6*  HCT 35.8  PLT 139*  MCV 82.8  MCH 26.9  MCHC 32.5  RDW 16.2*   ------------------------------------------------------------------------------------------------------------------  Chemistries   Recent Labs Lab 12/07/15 0012  NA 139  K 4.4  CL 102  CO2 25  GLUCOSE 103*  BUN 46*  CREATININE 6.62*  CALCIUM 9.3   ------------------------------------------------------------------------------------------------------------------ estimated creatinine clearance is 8 mL/min (by C-G formula based on Cr of  6.62). ------------------------------------------------------------------------------------------------------------------ No results for input(s): TSH, T4TOTAL, T3FREE, THYROIDAB in the last 72 hours.  Invalid input(s): FREET3   Coagulation profile No results for input(s): INR, PROTIME in the last 168 hours. ------------------------------------------------------------------------------------------------------------------- No results for input(s): DDIMER in the last 72 hours. -------------------------------------------------------------------------------------------------------------------  Cardiac Enzymes  Recent Labs Lab 12/07/15 0012  TROPONINI <0.03   ------------------------------------------------------------------------------------------------------------------ Invalid input(s): POCBNP  ---------------------------------------------------------------------------------------------------------------  Urinalysis    Component Value Date/Time   COLORURINE STRAW* 12/07/2015 0118   COLORURINE Yellow 10/23/2013 2127   APPEARANCEUR CLEAR* 12/07/2015  [place] 10/23/2013 2127   LABSPEC 1.005 12/07/2015 0118   LABSPEC 1.006 10/23/2013 2127   PHURINE 9.0* 12/07/2015 0118   PHURINE 6.0 10/23/2013 2127   GLUCOSEU 50* 12/07/2015 0118   GLUCOSEU Negative 10/23/2013 2127   HGBUR 1+* 12/07/2015 0118   HGBUR Negative 10/23/2013 2127   BILIRUBINUR NEGATIVE 12/07/2015 0118   BILIRUBINUR Negative 10/23/2013 2127   KETONESUR NEGATIVE 12/07/2015 0118   KETONESUR Negative 10/23/2013 2127   PROTEINUR 30* 12/07/2015 0118   PROTEINUR 30 mg/dL 10/23/2013 2127   NITRITE NEGATIVE 12/07/2015 0118   NITRITE Negative 10/23/2013 2127   LEUKOCYTESUR NEGATIVE 12/07/2015 0118   LEUKOCYTESUR 1+ 10/23/2013 2127     RADIOLOGY: Ct Head Wo Contrast  12/07/2015  CLINICAL DATA:  Syncope. Collapsed at work with loss of consciousness. Dialysis today. EXAM: CT HEAD WITHOUT CONTRAST  TECHNIQUE: Contiguous axial images were obtained from the base of the skull through the vertex without intravenous contrast. COMPARISON:  MRI brain 11/28/2014.  CT head 01/17/2013. FINDINGS: Mild cerebral atrophy. Mild ventricular dilatation likely due to central atrophy. Patchy low-attenuation changes in the deep white matter probably representing small vessel ischemic change. No mass effect or midline shift. No abnormal extra-axial fluid collections. Gray-white matter junctions are distinct. Basal cisterns are not effaced. No evidence of acute intracranial hemorrhage. No depressed skull fractures. Mucosal thickening throughout the paranasal sinuses. Opacification and sclerosis in some of the right mastoid air cells. Vascular calcifications. IMPRESSION: No acute intracranial abnormalities. Mild chronic atrophy and small vessel ischemic changes. Electronically Signed   By: Lucienne Capers M.D.   On: 12/07/2015 00:56   Dg Chest Port 1 View  12/07/2015  CLINICAL DATA:  Syncope while at work. EXAM: PORTABLE CHEST 1 VIEW COMPARISON:  07/10/2010 FINDINGS: Lower lung volumes from prior exam. The cardiomediastinal contours are normal. The lungs are clear. Pulmonary vasculature is normal. No consolidation, pleural effusion, or pneumothorax. No acute osseous abnormalities are seen. IMPRESSION: Low lung volumes without acute process. Electronically Signed   By: Jeb Levering M.D.   On: 12/07/2015 00:38    EKG: Orders placed or performed during the hospital encounter of 12/06/15  . ED EKG  . ED EKG  . EKG 12-Lead  . EKG 12-Lead    ASSESSMENT  Principal Problem:   Cardiogenic Syncope Active Problems:   ESRD on hemodialysis (HCC)   T2DM (type 2 diabetes mellitus) (HCC)   URI (upper respiratory infection)  PLAN   1). Cardiogenic syncope - likely - Patient had a syncopeeither by nausea. She denies any prior dizziness. She has not had a history of syncope in the past. EMS reported the patient was  bradycardic and had bigeminy on EKG prior to arrival but in the ED, patient's EKG shows normal sinus rhythm. - Initial troponin was negative. We'll obtain 2 months S rule out ACS. - lace patient on telemetry and obtain TSH and 2-D echo. - Check orthostatic vitals and continue when necessary Zofran and meclizine. - Ensure follow precautions. - Obtain influenza A and B swabs as patient's husband has the flu.  2). ESRD on hemodialysis - No complaint at this time. Consult nephrology for hemodialysis.  3). Type 2 diabetes mellitus - Patient is not on home medications. Start sliding scale insulin and monitor glucose levels. - Consistent carbohydrate diet - Check A1c  4). URI - Patient complains of cough that is productive of clear to yellowish sputum. Husband Has Just Presented Discharge after Having Influenza. - Obtain flu swabs and start  patient on when necessary guaifenesin. - Place patient on droplet isolation pending flu results  All the records are reviewed and case discussed with ED provider. Management plans discussed with the patient, family and they are in agreement.  CODE STATUS: Code Status History    This patient does not have a recorded code status. Please follow your organizational policy for patients in this situation.      I have independently reviewed all EKG and chest x-ray data  VTE prophylaxis: Heparin if no contraindications and patient at low risk for bleeding. SCD's and progressive ambulation if patient has contraindications to anticoagulants. No DVT prophylaxis if patient presently receiving therapeutic anticoagulation or is at significant risk of bleeding for which the risk of anticoagulation outweigh the potential benefits.  Vaccinations: Pneumonia & flu vaccine per hospital protocol Prevention: Will proceed with conservative measures for the prevention of delirium in patients older than 65. Fall precautions and 1:1 sitter as needed per hospital  protocol.  TOTAL TIME TAKING CARE OF THIS PATIENT: 50 minutes.    Sylvan Cheese M.D on 12/07/2015 at 3:04 AM  Between 7am to 6pm - Pager - [phone]  After 6pm go to [email or web address] - password [email or web address] [facility]  [facility]  Office  [phone]  CC: Primary care physician; Ria Bush, MD

## 2015-12-07 NOTE — Consult Note (Signed)
[facility]  CONSULT NOTE    Date: 12/07/2015                  Patient Name:  Kristen Kirk  MRN: [id number]  DOB: 1951-01-06  Age / Sex: 65 y.o., female         PCP: Ria Bush, MD                 Service Requesting Consult: Dr. Manuella Ghazi                 Reason for Consult: End Stage Renal Disease            History of Present Illness: Kristen Kirk is a 65 y.o. black female with diabetes mellitus type II, hypertension, hyperlipidemia, glaucoma, GERD, anemia and incontinence, who was admitted to [facility] on 12/06/2015 for HLD (hyperlipidemia) [E78.5] ESRD (end stage renal disease) on dialysis ([place]) [N18.6, Z99.2] Essential hypertension [I10] Syncope, unspecified syncope type [R55] Gastroesophageal reflux disease, esophagitis presence not specified [K21.9] Controlled type 2 diabetes mellitus with diabetic nephropathy, without long-term current use of insulin ([place]) [E11.21]  Patient received dialysis yesterday at [facility]. She follows with [facility]. Has a left forearm AVF.   Patient had presyncope at work. Influenza positive.    Medications: Outpatient medications: Prescriptions prior to admission  Medication Sig Dispense Refill Last Dose  . amLODipine (NORVASC) 10 MG tablet Take 1 tablet (10 mg total) by mouth daily. 90 tablet 1 12/06/2015 at Unknown time  . Ascorbic Acid (VITAMIN C) 100 MG tablet Take 100 mg by mouth daily.     12/06/2015 at Unknown time  . aspirin 81 MG tablet Take 81 mg by mouth daily.   12/06/2015 at 0800  . Blood Glucose Monitoring Suppl (ONE TOUCH ULTRA SYSTEM KIT) W/DEVICE KIT 1 kit by Does not apply route once. 1 each 0 12/06/2015 at Unknown time  . carvedilol (COREG) 12.5 MG tablet Take 1 tablet (12.5 mg total) by mouth 2 (two) times daily with a meal.   12/06/2015 at Unknown time  . Cholecalciferol (VITAMIN D3) 1000 UNITS CAPS Take 1 capsule by mouth 2 (two) times daily.     12/06/2015 at Unknown time  . cyclobenzaprine  (FLEXERIL) 5 MG tablet Take 1-2 tablets (5-10 mg total) by mouth 2 (two) times daily as needed for muscle spasms. 40 tablet 0 prn at prn  . diclofenac sodium (VOLTAREN) 1 % GEL Apply 1 application topically 3 (three) times daily. 1 Tube 1 prn at prn  . dorzolamide-timolol (COSOPT) 22.3-6.8 MG/ML ophthalmic solution Place 1 drop into both eyes 3 (three) times daily.     12/06/2015 at Unknown time  . esomeprazole (NEXIUM) 40 MG capsule Take 1 capsule (40 mg total) by mouth daily. 30 capsule 3 prn at prn  . furosemide (LASIX) 40 MG tablet Take 1 tablet (40 mg total) by mouth 2 (two) times daily. 180 tablet 2 12/06/2015 at Unknown time  . glucose blood test strip Check sugars once daily 100 each 11 12/06/2015 at Unknown time  . hydrOXYzine (ATARAX/VISTARIL) 25 MG tablet Take 1 tablet (25 mg total) by mouth 2 (two) times daily as needed.   prn at prn  . lisinopril (PRINIVIL,ZESTRIL) 10 MG tablet Take 5 mg by mouth daily.   12/06/2015 at Unknown time  . meclizine (ANTIVERT) 25 MG tablet Take 1 tablet (25 mg total) by mouth 3 (three) times daily as needed. 30 tablet 1 prn at prn  .  Multiple Vitamin (MULTIVITAMIN) tablet Take 1 tablet by mouth daily.     12/06/2015 at Unknown time  . NEOMYCIN-POLYMYXIN-HYDROCORTISONE (CORTISPORIN) 1 % SOLN otic solution Apply 1-2 drops to toe BID after soaking 10 mL 1 12/06/2015 at Unknown time  . polyethylene glycol powder (GLYCOLAX/MIRALAX) powder Take 17 g by mouth daily as needed. 3350 g 1 Past Week at Unknown time  . pravastatin (PRAVACHOL) 80 MG tablet Take 1 tablet by mouth  every evening 90 tablet 2 12/06/2015 at Unknown time  . sevelamer carbonate (RENVELA) 800 MG tablet Take 800 mg by mouth 3 (three) times daily with meals.   12/06/2015 at Unknown time  . sodium bicarbonate 650 MG tablet Take 1 tablet (650 mg total) by mouth 3 (three) times daily.   12/06/2015 at Unknown time  . sucralfate (CARAFATE) 1 g tablet Take 1 tablet (1 g total) by mouth daily. 30 tablet 0 Past Week at  Unknown time  . travoprost, benzalkonium, (TRAVATAN) 0.004 % ophthalmic solution Place 1 drop into both eyes at bedtime.     12/06/2015 at Unknown time  . traZODone (DESYREL) 50 MG tablet Take 50 mg by mouth at bedtime as needed.    12/06/2015 at Unknown time  . vitamin E 100 UNIT capsule Take 400 Units by mouth daily.    12/06/2015 at Unknown time  . cinacalcet (SENSIPAR) 30 MG tablet Take 30 mg by mouth daily. Reported on 12/07/2015   Not Taking at Unknown time    Current medications: Current Facility-Administered Medications  Medication Dose Route Frequency Provider Last Rate Last Dose  . amLODipine (NORVASC) tablet 10 mg  10 mg Oral Daily Sylvan Cheese, MD   10 mg at 12/07/15 0930  . aspirin chewable tablet 81 mg  81 mg Oral Daily Sylvan Cheese, MD   81 mg at 12/07/15 0930  . carvedilol (COREG) tablet 12.5 mg  12.5 mg Oral BID WC Sylvan Cheese, MD   12.5 mg at 12/07/15 0817  . cholecalciferol (VITAMIN D) tablet 1,000 Units  1,000 Units Oral BID Sylvan Cheese, MD   1,000 Units at 12/07/15 0930  . cinacalcet (SENSIPAR) tablet 30 mg  30 mg Oral Daily Sylvan Cheese, MD   30 mg at 12/07/15 0930  . cyclobenzaprine (FLEXERIL) tablet 5-10 mg  5-10 mg Oral BID PRN Sylvan Cheese, MD      . dorzolamide-timolol (COSOPT) 22.3-6.8 MG/ML ophthalmic solution 1 drop  1 drop Both Eyes TID Sylvan Cheese, MD   1 drop at 12/07/15 1041  . furosemide (LASIX) tablet 40 mg  40 mg Oral BID Sylvan Cheese, MD   40 mg at 12/07/15 0817  . guaiFENesin (ROBITUSSIN) 100 MG/5ML solution 100 mg  5 mL Oral Q4H PRN Sylvan Cheese, MD      . heparin injection 5,000 Units  5,000 Units Subcutaneous 3 times per day Sylvan Cheese, MD   5,000 Units at 12/07/15 0649  . hydrOXYzine (ATARAX/VISTARIL) tablet 25 mg  25 mg Oral BID PRN Sylvan Cheese, MD      . insulin aspart (novoLOG) injection 0-5 Units  0-5 Units Subcutaneous QHS Sylvan Cheese, MD      . insulin aspart (novoLOG) injection 0-9 Units  0-9 Units Subcutaneous  TID WC Sylvan Cheese, MD   0 Units at 12/07/15 0750  . latanoprost (XALATAN) 0.005 % ophthalmic solution 1 drop  1 drop Both Eyes QHS Abdullahi Oseni, MD      . lisinopril (PRINIVIL,ZESTRIL) tablet 5 mg  5 mg Oral Daily Sylvan Cheese, MD  5 mg at 12/07/15 0930  . meclizine (ANTIVERT) tablet 25 mg  25 mg Oral TID PRN Sylvan Cheese, MD      . neomycin-polymyxin-hydrocortisone (CORTISPORIN) otic suspension 2 drop  2 drop Both Ears 3 times per day Sylvan Cheese, MD   2 drop at 12/07/15 0937  . ondansetron (ZOFRAN) injection 4 mg  4 mg Intravenous Q6H PRN Sylvan Cheese, MD      . oseltamivir (TAMIFLU) capsule 75 mg  75 mg Oral BID Saundra Shelling, MD   75 mg at 12/07/15 0930  . pantoprazole (PROTONIX) EC tablet 40 mg  40 mg Oral Daily Sylvan Cheese, MD   40 mg at 12/07/15 0930  . polyethylene glycol (MIRALAX / GLYCOLAX) packet 17 g  17 g Oral Daily PRN Sylvan Cheese, MD      . pravastatin (PRAVACHOL) tablet 80 mg  80 mg Oral Daily Sylvan Cheese, MD   80 mg at 12/07/15 0931  . sevelamer carbonate (RENVELA) tablet 800 mg  800 mg Oral TID WC Sylvan Cheese, MD   800 mg at 12/07/15 0817  . sodium bicarbonate tablet 650 mg  650 mg Oral TID Sylvan Cheese, MD   650 mg at 12/07/15 0930  . sucralfate (CARAFATE) tablet 1 g  1 g Oral Daily Sylvan Cheese, MD   1 g at 12/07/15 0930  . traZODone (DESYREL) tablet 50 mg  50 mg Oral QHS PRN Sylvan Cheese, MD          Allergies: Allergies  Allergen Reactions  . Brimonidine     Other reaction(s): Other (See Comments) eye redness  . Shellfish-Derived Products Swelling      Past Medical History: Past Medical History  Diagnosis Date  . CKD (chronic kidney disease) stage 5, GFR less than 15 ml/min (HCC)     per [facility] renal (Dr. Alfonse Alpers) considering transplant, referred for HD access and initiation  . Diabetes mellitus type II     currently diet controlled since losing weight  . HLD (hyperlipidemia)   . GERD (gastroesophageal reflux  disease)   . HTN (hypertension) pre-2003  . Urge incontinence     Dr. Matilde Sprang to do PTNS  . Vitamin D deficiency   . Glaucoma     L>R  . DJD (degenerative joint disease)   . Osteoarthritis   . Vaginal atrophy     vagifem caused spotting, normal TVS  . Anemia in chronic kidney disease     s/p aranesp 11/2013 now starting epo with HD 06/2014     Past Surgical History: Past Surgical History  Procedure Laterality Date  . Tubal ligation  1990s    bilateral  . Bladder suspension  2011    for stress incontinence-Dr. MacDiarmid  . Refractive surgery    . Left eye laser surgery  06/2011  . Colonoscopy  02/04/2009    normal, rec rpt 10 yrs     Family History: Family History  Problem Relation Age of Onset  . Stroke Brother   . Hyperlipidemia Brother   . Hypertension Brother   . Hyperlipidemia Brother   . Hypertension Brother   . Hyperlipidemia Sister   . Hypertension Sister   . Hyperlipidemia Sister   . Hypertension Sister   . Diabetes Mother   . Heart failure Mother   . Heart failure Maternal Grandfather      Social History: Social History   Social History  . Marital Status: Married    Spouse Name: N/A  . Number of Children: 1  .  Years of Education: N/A   Occupational History  . [employer]-lab assistant   .     Social History Main Topics  . Smoking status: Former Research scientist (life sciences)  . Smokeless tobacco: Never Used     Comment: Remote  . Alcohol Use: No  . Drug Use: No  . Sexual Activity:    Partners: Male   Other Topics Concern  . 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  Negative for fever, chills, weight loss, malaise/fatigue and diaphoresis.  HENT: Negative.  Negative for  congestion, ear discharge, ear pain, hearing loss, nosebleeds, sore throat and tinnitus.   Eyes: Negative.  Negative for blurred vision, double vision, photophobia, pain, discharge and redness.  Respiratory: Negative.  Negative for cough, hemoptysis, sputum production, shortness of breath, wheezing and stridor.   Cardiovascular: Negative.  Negative for chest pain, palpitations, orthopnea, claudication, leg swelling and PND.  Gastrointestinal: Positive for nausea and vomiting. Negative for heartburn, abdominal pain, diarrhea, constipation, blood in stool and melena.  Genitourinary: Positive for urgency. Negative for dysuria, frequency, hematuria and flank pain.  Musculoskeletal: Negative.  Negative for myalgias, back pain, joint pain, falls and neck pain.  Skin: Negative.  Negative for itching and rash.  Neurological: Positive for dizziness and loss of consciousness. Negative for tingling, tremors, sensory change, speech change, focal weakness, seizures, weakness and headaches.  Endo/Heme/Allergies: Negative.  Negative for environmental allergies and polydipsia. Does not bruise/bleed easily.  Psychiatric/Behavioral: Negative.  Negative for depression, suicidal ideas, hallucinations, memory loss and substance abuse. The patient is not nervous/anxious and does not have insomnia.     Vital Signs: Blood pressure 120/54, pulse 63, temperature 98.1 F (36.7 C), temperature source Oral, resp. rate 18, height '5\' 3"'  (1.6 m), weight 68 kg (149 lb 14.6 oz), SpO2 100 %.  Weight trends: Filed Weights   12/07/15 0000  Weight: 68 kg (149 lb 14.6 oz)    Physical Exam: General: NAD, laying in bed  Head: Normocephalic, atraumatic. Moist oral mucosal membranes  Eyes: Anicteric, PERRL  Neck: Supple, trachea midline  Lungs:  Clear to auscultation  Heart: Regular rate and rhythm  Abdomen:  Soft, nontender,   Extremities:  no peripheral edema.  Neurologic: Nonfocal, moving all four extremities  Skin: No  lesions  Access: Left forearm AVF     Lab results: Basic Metabolic Panel:  Recent Labs Lab 12/07/15 0012 12/07/15 0536  NA 139 140  K 4.4 4.7  CL 102 104  CO2 25 27  GLUCOSE 103* 94  BUN 46* 51*  CREATININE 6.62* 6.99*  CALCIUM 9.3 9.4    Liver Function Tests: No results for input(s): AST, ALT, ALKPHOS, BILITOT, PROT, ALBUMIN in the last 168 hours. No results for input(s): LIPASE, AMYLASE in the last 168 hours. No results for input(s): AMMONIA in the last 168 hours.  CBC:  Recent Labs Lab 12/07/15 0012 12/07/15 0536  WBC 7.5 6.6  HGB 11.6* 11.6*  HCT 35.8 35.5  MCV 82.8 82.2  PLT 139* 143*    Cardiac Enzymes:  Recent Labs Lab 12/07/15 0012 12/07/15 0536  TROPONINI <0.03 <0.03    BNP: Invalid input(s): POCBNP  CBG:  Recent Labs Lab 12/07/15 0507  12/07/15 0742 12/07/15 1123  GLUCAP 89 79 83    Microbiology: Results for orders placed or performed during the hospital encounter of 12/06/15  MRSA PCR Screening     Status: None   Collection Time: 12/07/15  6:55 AM  Result Value Ref Range Status   MRSA by PCR NEGATIVE NEGATIVE Final    Comment:        The GeneXpert MRSA Assay (FDA approved for NASAL specimens only), is one component of a comprehensive MRSA colonization surveillance program. It is not intended to diagnose MRSA infection nor to guide or monitor treatment for MRSA infections.     Coagulation Studies: No results for input(s): LABPROT, INR in the last 72 hours.  Urinalysis:  Recent Labs  12/07/15 0118  COLORURINE STRAW*  LABSPEC 1.005  PHURINE 9.0*  GLUCOSEU 50*  HGBUR 1+*  BILIRUBINUR NEGATIVE  KETONESUR NEGATIVE  PROTEINUR 30*  NITRITE NEGATIVE  LEUKOCYTESUR NEGATIVE      Imaging: Ct Head Wo Contrast  12/07/2015  CLINICAL DATA:  Syncope. Collapsed at work with loss of consciousness. Dialysis today. EXAM: CT HEAD WITHOUT CONTRAST TECHNIQUE: Contiguous axial images were obtained from the base of the skull  through the vertex without intravenous contrast. COMPARISON:  MRI brain 11/28/2014.  CT head 01/17/2013. FINDINGS: Mild cerebral atrophy. Mild ventricular dilatation likely due to central atrophy. Patchy low-attenuation changes in the deep white matter probably representing small vessel ischemic change. No mass effect or midline shift. No abnormal extra-axial fluid collections. Gray-white matter junctions are distinct. Basal cisterns are not effaced. No evidence of acute intracranial hemorrhage. No depressed skull fractures. Mucosal thickening throughout the paranasal sinuses. Opacification and sclerosis in some of the right mastoid air cells. Vascular calcifications. IMPRESSION: No acute intracranial abnormalities. Mild chronic atrophy and small vessel ischemic changes. Electronically Signed   By: Lucienne Capers M.D.   On: 12/07/2015 00:56   Dg Chest Port 1 View  12/07/2015  CLINICAL DATA:  Syncope while at work. EXAM: PORTABLE CHEST 1 VIEW COMPARISON:  07/10/2010 FINDINGS: Lower lung volumes from prior exam. The cardiomediastinal contours are normal. The lungs are clear. Pulmonary vasculature is normal. No consolidation, pleural effusion, or pneumothorax. No acute osseous abnormalities are seen. IMPRESSION: Low lung volumes without acute process. Electronically Signed   By: Jeb Levering M.D.   On: 12/07/2015 00:38      Assessment & Plan: Ms. TENITA CUE is a 65 y.o. black female with diabetes mellitus type II, hypertension, hyperlipidemia, glaucoma, GERD, anemia and incontinence, who was admitted to [facility] on 12/06/2015.   MWF [facility]  1. End Stage Renal Disease: MWF. Last treatment was yesterday. Through left forearm AVF - schedule dialysis for tomorrow.   2. Hypertension: admitted with dizziness.  - amlodipine, carvedilol, furosemide, lisinopril  3. Secondary Hyperparathyroidism and vitamin D deficiency - cinacalcet, vitamin D, and takes sevelamer with meals.    4. Anemia with chronic kidney disease: Hemoglobin 11.6 - [facility] as outpatient.    LOSJuleen Kirk, Kristen Kirk 3/7/201711:46 AM

## 2015-12-07 NOTE — Plan of Care (Signed)
Problem: Pain Managment: Goal: General experience of comfort will improve Outcome: Progressing Prn medications  Problem: Physical Regulation: Goal: Will remain free from infection Outcome: Not Progressing + flu, antibiotics  Problem: Tissue Perfusion: Goal: Risk factors for ineffective tissue perfusion will decrease Outcome: Progressing SQ heparin  Problem: Fluid Volume: Goal: Ability to maintain a balanced intake and output will improve Outcome: Progressing Monitor I&O

## 2015-12-08 ENCOUNTER — Observation Stay: Payer: 59

## 2015-12-08 LAB — RENAL FUNCTION PANEL
ALBUMIN: 3.6 g/dL (ref 3.5–5.0)
ANION GAP: 12 (ref 5–15)
BUN: 76 mg/dL — AB (ref 6–20)
CALCIUM: 8.9 mg/dL (ref 8.9–10.3)
CO2: 23 mmol/L (ref 22–32)
Chloride: 104 mmol/L (ref 101–111)
Creatinine, Ser: 9.34 mg/dL — ABNORMAL HIGH (ref 0.44–1.00)
GFR calc Af Amer: 5 mL/min — ABNORMAL LOW (ref >60)
GFR calc non Af Amer: 4 mL/min — ABNORMAL LOW (ref >60)
GLUCOSE: 95 mg/dL (ref 65–99)
PHOSPHORUS: 3.9 mg/dL (ref 2.5–4.6)
Potassium: 5.6 mmol/L — ABNORMAL HIGH (ref 3.5–5.1)
SODIUM: 139 mmol/L (ref 135–145)

## 2015-12-08 LAB — CBC
HCT: 34.8 % — ABNORMAL LOW (ref 35.0–47.0)
HEMOGLOBIN: 11.5 g/dL — AB (ref 12.0–16.0)
MCH: 27.3 pg (ref 26.0–34.0)
MCHC: 33.2 g/dL (ref 32.0–36.0)
MCV: 82.3 fL (ref 80.0–100.0)
Platelets: 151 10*3/uL (ref 150–440)
RBC: 4.23 MIL/uL (ref 3.80–5.20)
RDW: 15.6 % — AB (ref 11.5–14.5)
WBC: 6.3 10*3/uL (ref 3.6–11.0)

## 2015-12-08 LAB — GLUCOSE, CAPILLARY
GLUCOSE-CAPILLARY: 187 mg/dL — AB (ref 65–99)
Glucose-Capillary: 80 mg/dL (ref 65–99)
Glucose-Capillary: 87 mg/dL (ref 65–99)

## 2015-12-08 MED ORDER — LORAZEPAM 1 MG PO TABS
1.0000 mg | ORAL_TABLET | Freq: Once | ORAL | Status: AC
  Administered 2015-12-08: 1 mg via ORAL
  Filled 2015-12-08: qty 1

## 2015-12-08 MED ORDER — DOCUSATE SODIUM 100 MG PO CAPS
100.0000 mg | ORAL_CAPSULE | Freq: Two times a day (BID) | ORAL | Status: DC
  Administered 2015-12-08 – 2015-12-09 (×2): 100 mg via ORAL
  Filled 2015-12-08 (×2): qty 1

## 2015-12-08 NOTE — Progress Notes (Signed)
PRE HD.  Pt arrived A/O x3,  Denies any discomforts.  Vitals stable, access +bruit and thrill, pt stable for HD

## 2015-12-08 NOTE — Progress Notes (Signed)
[place] [facility] [place] NAME: Kristen Kirk    MR#:  [id number]  DATE OF BIRTH:  1950/11/22  SUBJECTIVE:  Still w th cough Seen in HD MRI not done yet  REVIEW OF SYSTEMS:  Review of Systems  Constitutional: Negative for fever, weight loss, malaise/fatigue and diaphoresis.  HENT: Negative for ear discharge, ear pain, hearing loss, nosebleeds, sore throat and tinnitus.   Eyes: Negative for blurred vision and pain.  Respiratory: Positive for cough. Negative for hemoptysis, sputum production, shortness of breath and wheezing.   Cardiovascular: Negative for chest pain, palpitations, orthopnea and leg swelling.  Gastrointestinal: Negative for heartburn, nausea, vomiting, abdominal pain, diarrhea, constipation and blood in stool.  Genitourinary: Negative for dysuria, urgency and frequency.  Musculoskeletal: Negative for myalgias and back pain.  Skin: Negative for itching and rash.  Neurological: Negative for dizziness, tingling, tremors, focal weakness, seizures, weakness and headaches.  Psychiatric/Behavioral: Negative for depression. The patient is not nervous/anxious.     DRUG ALLERGIES:   Allergies  Allergen Reactions  . Brimonidine     Other reaction(s): Other (See Comments) eye redness  . Shellfish-Derived Products Swelling   VITALS:  Blood pressure 144/71, pulse 64, temperature 99.6 F (37.6 C), temperature source Oral, resp. rate 14, height 5\' 3"  (1.6 m), weight 67.8 kg (149 lb 7.6 oz), SpO2 99 %. PHYSICAL EXAMINATION:  Physical Exam  Constitutional: She is oriented to person, place, and time and well-developed, well-nourished, and in no distress.  HENT:  Head: Normocephalic and atraumatic.  Eyes: Conjunctivae and EOM are normal. Pupils are equal, round, and reactive to light.  Neck: Normal range of motion. Neck supple. No tracheal deviation present. No thyromegaly present.  Cardiovascular: Normal rate, regular rhythm and  normal heart sounds.   Pulmonary/Chest: Effort normal and breath sounds normal. No respiratory distress. She has no wheezes. She exhibits no tenderness.  Abdominal: Soft. Bowel sounds are normal. She exhibits no distension. There is no tenderness.  Musculoskeletal: Normal range of motion.  Neurological: She is alert and oriented to person, place, and time. No cranial nerve deficit.  Skin: Skin is warm and dry. No rash noted.  Psychiatric: Mood and affect normal.   LABORATORY PANEL:   CBC  Recent Labs Lab 12/08/15 0905  WBC 6.3  HGB 11.5*  HCT 34.8*  PLT 151   ------------------------------------------------------------------------------------------------------------------ Chemistries   Recent Labs Lab 12/08/15 0905  NA 139  K 5.6*  CL 104  CO2 23  GLUCOSE 95  BUN 76*  CREATININE 9.34*  CALCIUM 8.9   RADIOLOGY:  No results found. ASSESSMENT AND PLAN:  1. vasovagal syncope: Pain the seting of INFLUENZA and GI upset/nausea.  She has a normal TSH and normal ECHO Cardiology and NEUROLOGY consult appreciated. She had negative orthostatic vitals  MRI and EEG pending  2. ESRD on hemodialysis: as per NEPHROLOGY  3. Type 2 diabetes mellitus - Patient is not on home medications. Start sliding scale insulin and monitor glucose levels. - Consistent carbohydrate diet - A1c 4.8 suggestive of diet controlled - excellent sugar control. Doubt she has DM based on this.  4. Positive Influenza A: Continue TAMIFLU Continue droplet isolation    D/w Dr Abigail Butts Management plans discussed with the patient and she is in agreement.  CODE STATUS: FULL CODE  TOTAL TIME TAKING CARE OF THIS PATIENT: 25 minutes.    POSSIBLE D/C IN AM, DEPENDING ON CLINICAL CONDITION. AWAIT mri Brain results.   Kristen Kirk M.D on  12/08/2015 at 12:13 PM  Between 7am to 6pm - Pager - [phone]  After 6pm go to [email or web address] - password [email or web address]  Office   [phone]  CC: Primary care physician; Ria Bush, MD  Note: This dictation was prepared with Dragon dictation along with smaller phrase technology. Any transcriptional errors that result from this process are unintentional.

## 2015-12-08 NOTE — Progress Notes (Signed)
Tx started as prescibed, denies SOB or any other discomforts. Access / lines visible.  Pt stable

## 2015-12-08 NOTE — Progress Notes (Signed)
Post HD  

## 2015-12-08 NOTE — Progress Notes (Signed)
Tx ended, pt rinsebacked, tolerated tx. Pt stable

## 2015-12-08 NOTE — Progress Notes (Signed)
Pt requesting stool softener for hardened stool. MD Dr. Tressia Miners notified, orders placed for colace. RN will continue to monitor. Rachael Fee, RN

## 2015-12-08 NOTE — Progress Notes (Signed)
[facility]  ROUNDING NOTE   Subjective:   Seen and examined on hemodialysis. Tolerating treatment well. Uf of 1 litre  EEG done today.   Objective:  Vital signs in last 24 hours:  Temp:  [98.9 F (37.2 C)-99.6 F (37.6 C)] 99.6 F (37.6 C) (03/08 1015) Pulse Rate:  [61-71] 64 (03/08 1100) Resp:  [13-21] 17 (03/08 1100) BP: (101-148)/(55-81) 104/61 mmHg (03/08 1100) SpO2:  [94 %-99 %] 99 % (03/08 1015) Weight:  [67.8 kg (149 lb 7.6 oz)] 67.8 kg (149 lb 7.6 oz) (03/08 1015)  Weight change:  Filed Weights   12/07/15 0000 12/08/15 1015  Weight: 68 kg (149 lb 14.6 oz) 67.8 kg (149 lb 7.6 oz)    Intake/Output: I/O last 3 completed shifts: In: 480 [P.O.:480] Out: 300 [Urine:300]   Intake/Output this shift:  Total I/O In: -  Out: 200 [Urine:200]  Physical Exam: General: NAD,   Head: Normocephalic, atraumatic. Moist oral mucosal membranes  Eyes: Anicteric, PERRL  Neck: Supple, trachea midline  Lungs:  Clear to auscultation  Heart: Regular rate and rhythm  Abdomen:  Soft, nontender,   Extremities: no peripheral edema.  Neurologic: Nonfocal, moving all four extremities  Skin: No lesions  Access: Left AVF    Basic Metabolic Panel:  Recent Labs Lab 12/07/15 0012 12/07/15 0536 12/08/15 0905  NA 139 140 139  K 4.4 4.7 5.6*  CL 102 104 104  CO2 25 27 23   GLUCOSE 103* 94 95  BUN 46* 51* 76*  CREATININE 6.62* 6.99* 9.34*  CALCIUM 9.3 9.4 8.9  PHOS  --   --  3.9    Liver Function Tests:  Recent Labs Lab 12/08/15 0905  ALBUMIN 3.6   No results for input(s): LIPASE, AMYLASE in the last 168 hours. No results for input(s): AMMONIA in the last 168 hours.  CBC:  Recent Labs Lab 12/07/15 0012 12/07/15 0536 12/08/15 0905  WBC 7.5 6.6 6.3  HGB 11.6* 11.6* 11.5*  HCT 35.8 35.5 34.8*  MCV 82.8 82.2 82.3  PLT 139* 143* 151    Cardiac Enzymes:  Recent Labs Lab 12/07/15 0012 12/07/15 0536 12/07/15 1135  TROPONINI <0.03 <0.03 <0.03     BNP: Invalid input(s): POCBNP  CBG:  Recent Labs Lab 12/07/15 0742 12/07/15 1123 12/07/15 1639 12/07/15 2114 12/08/15 0748  GLUCAP 79 83 96 112* 27    Microbiology: Results for orders placed or performed during the hospital encounter of 12/06/15  MRSA PCR Screening     Status: None   Collection Time: 12/07/15  6:55 AM  Result Value Ref Range Status   MRSA by PCR NEGATIVE NEGATIVE Final    Comment:        The GeneXpert MRSA Assay (FDA approved for NASAL specimens only), is one component of a comprehensive MRSA colonization surveillance program. It is not intended to diagnose MRSA infection nor to guide or monitor treatment for MRSA infections.     Coagulation Studies: No results for input(s): LABPROT, INR in the last 72 hours.  Urinalysis:  Recent Labs  12/07/15 0118  COLORURINE STRAW*  LABSPEC 1.005  PHURINE 9.0*  GLUCOSEU 50*  HGBUR 1+*  BILIRUBINUR NEGATIVE  KETONESUR NEGATIVE  PROTEINUR 30*  NITRITE NEGATIVE  LEUKOCYTESUR NEGATIVE      Imaging: Ct Head Wo Contrast  12/07/2015  CLINICAL DATA:  Syncope. Collapsed at work with loss of consciousness. Dialysis today. EXAM: CT HEAD WITHOUT CONTRAST TECHNIQUE: Contiguous axial images were obtained from the base of the skull through the  vertex without intravenous contrast. COMPARISON:  MRI brain 11/28/2014.  CT head 01/17/2013. FINDINGS: Mild cerebral atrophy. Mild ventricular dilatation likely due to central atrophy. Patchy low-attenuation changes in the deep white matter probably representing small vessel ischemic change. No mass effect or midline shift. No abnormal extra-axial fluid collections. Gray-white matter junctions are distinct. Basal cisterns are not effaced. No evidence of acute intracranial hemorrhage. No depressed skull fractures. Mucosal thickening throughout the paranasal sinuses. Opacification and sclerosis in some of the right mastoid air cells. Vascular calcifications. IMPRESSION: No acute  intracranial abnormalities. Mild chronic atrophy and small vessel ischemic changes. Electronically Signed   By: Lucienne Capers M.D.   On: 12/07/2015 00:56   Dg Chest Port 1 View  12/07/2015  CLINICAL DATA:  Syncope while at work. EXAM: PORTABLE CHEST 1 VIEW COMPARISON:  07/10/2010 FINDINGS: Lower lung volumes from prior exam. The cardiomediastinal contours are normal. The lungs are clear. Pulmonary vasculature is normal. No consolidation, pleural effusion, or pneumothorax. No acute osseous abnormalities are seen. IMPRESSION: Low lung volumes without acute process. Electronically Signed   By: Jeb Levering M.D.   On: 12/07/2015 00:38     Medications:     . amLODipine  10 mg Oral Daily  . aspirin  81 mg Oral Daily  . carvedilol  12.5 mg Oral BID WC  . cholecalciferol  1,000 Units Oral BID  . cinacalcet  30 mg Oral Daily  . dorzolamide-timolol  1 drop Both Eyes TID  . furosemide  40 mg Oral BID  . heparin  5,000 Units Subcutaneous 3 times per day  . insulin aspart  0-5 Units Subcutaneous QHS  . insulin aspart  0-9 Units Subcutaneous TID WC  . latanoprost  1 drop Both Eyes QHS  . lisinopril  5 mg Oral Daily  . neomycin-polymyxin-hydrocortisone  2 drop Both Ears 3 times per day  . oseltamivir  30 mg Oral Q M,W,F-1800  . pantoprazole  40 mg Oral Daily  . pravastatin  80 mg Oral Daily  . sevelamer carbonate  800 mg Oral TID WC  . sodium bicarbonate  650 mg Oral TID  . sucralfate  1 g Oral Daily   cyclobenzaprine, guaiFENesin, hydrOXYzine, meclizine, ondansetron (ZOFRAN) IV, polyethylene glycol, traZODone  Assessment/ Plan:  Kristen Kirk is a 65 y.o. black female with diabetes mellitus type II, hypertension, hyperlipidemia, glaucoma, GERD, anemia and incontinence, who was admitted to [facility] on 12/06/2015.   MWF [facility]  1. End Stage Renal Disease: MWF. With some hyperkalemia today. 3 K bath - tolerating treatment well. Continue MWF schedule.   2.  Hypertension: admitted with dizziness. Some hypotension.  - amlodipine, carvedilol, furosemide, lisinopril  3. Secondary Hyperparathyroidism and vitamin D deficiency - cinacalcet, vitamin D - sevelamer with meals.   4. Anemia with chronic kidney disease: Hemoglobin 11.5 - [facility] as outpatient.    LOS:  Kristen Kirk 3/8/201711:53 AM

## 2015-12-08 NOTE — Progress Notes (Signed)
Attempted EEG. Pt is unavailable. dialysis

## 2015-12-08 NOTE — Progress Notes (Signed)
MD notified. Pt complains of clostrophobia, MRI pending. Orders for ativan received. Will continue to assess.

## 2015-12-08 NOTE — Progress Notes (Signed)
PRE HD   

## 2015-12-09 DIAGNOSIS — R55 Syncope and collapse: Secondary | ICD-10-CM | POA: Diagnosis not present

## 2015-12-09 LAB — HEPATITIS B SURFACE ANTIGEN
Hepatitis B Surface Ag: NEGATIVE
Hepatitis B Surface Ag: NEGATIVE

## 2015-12-09 LAB — PARATHYROID HORMONE, INTACT (NO CA): PTH: 106 pg/mL — AB (ref 15–65)

## 2015-12-09 LAB — HEPATITIS B CORE ANTIBODY, TOTAL: HEP B C TOTAL AB: NEGATIVE

## 2015-12-09 LAB — GLUCOSE, CAPILLARY
GLUCOSE-CAPILLARY: 115 mg/dL — AB (ref 65–99)
Glucose-Capillary: 133 mg/dL — ABNORMAL HIGH (ref 65–99)

## 2015-12-09 LAB — HEPATITIS B SURFACE ANTIBODY,QUALITATIVE: HEP B S AB: REACTIVE

## 2015-12-09 MED ORDER — POLYETHYLENE GLYCOL 3350 17 G PO PACK: 17.0000 g | PACK | Freq: Every day | ORAL | Status: DC | PRN

## 2015-12-09 MED ORDER — OSELTAMIVIR PHOSPHATE 30 MG PO CAPS: 30.0000 mg | ORAL_CAPSULE | ORAL | Status: DC

## 2015-12-09 NOTE — Discharge Summary (Signed)
[place] [facility] [place] NAME: Kristen Kirk    MR#:  [id number]  DATE OF BIRTH:  [birth year]  DATE OF ADMISSION:  12/06/2015 ADMITTING PHYSICIAN: Vaughan Basta, MD  DATE OF DISCHARGE: 12/09/2015  PRIMARY CARE PHYSICIAN: Ria Bush, MD    ADMISSION DIAGNOSIS:  HLD (hyperlipidemia) [E78.5] ESRD (end stage renal disease) on dialysis ([place]) [N18.6, Z99.2] Essential hypertension [I10] Syncope, unspecified syncope type [R55] Gastroesophageal reflux disease, esophagitis presence not specified [K21.9] Controlled type 2 diabetes mellitus with diabetic nephropathy, without long-term current use of insulin ([place]) [E11.21]  DISCHARGE DIAGNOSIS:  Principal Problem:   Cardiogenic Syncope Active Problems:   ESRD on hemodialysis ([place])   T2DM (type 2 diabetes mellitus) ([place])   URI (upper respiratory infection)   Controlled type 2 diabetes mellitus with diabetic nephropathy, without long-term current use of insulin ([facility])   Essential hypertension   SECONDARY DIAGNOSIS:   Past Medical History  Diagnosis Date  . ESRD (end stage renal disease) ([place])     a. Followed @ [facility];  b. On dialysis since late 2015 (MWF).  . Diabetes mellitus type II     a. Currently diet controlled since losing weight.  Marland Kitchen HLD (hyperlipidemia)   . GERD (gastroesophageal reflux disease)   . Essential hypertension   . History of Urge Incontinence   . Vitamin D deficiency   . Glaucoma     a. L>R  . DJD (degenerative joint disease)   . Osteoarthritis   . Vaginal atrophy     a. vagifem caused spotting, normal TVS  . Anemia in chronic kidney disease     a. s/p aranesp 11/2013 now starting epo with HD 06/2014  . Syncope     a. 12/2015.  Marland Kitchen History of echocardiogram     a. 01/2015 Echo [facility]): EF ?55%, triv MR, mildly dil LA, nl PV.  Marland Kitchen History of stress test     a. 03/2013 My[facility]): EF 63%, no ischemia.    HOSPITAL COURSE:   1. vasovagal syncope: Pain the  seting of INFLUENZA and GI upset/nausea.  She has a normal TSH and normal ECHO Cardiology and NEUROLOGY consult appreciated. She had negative orthostatic vitals  MRI was negative   2. ESRD on hemodialysis: Continue Monday, Wednesday and Friday hemodialysis  3. Type 2 diabetes mellitus: Diet-controlled  4. Positive Influenza A: Patient will need to finish Tamiflu    DISCHARGE CONDITIONS AND DIET:   Stable for discharge on renal/diabetic diet  CONSULTS OBTAINED:  Treatment Team:  Lavonia Dana, MD Catarina Hartshorn, MD  DRUG ALLERGIES:   Allergies  Allergen Reactions  . Brimonidine     Other reaction(s): Other (See Comments) eye redness  . Shellfish-Derived Products Swelling    DISCHARGE MEDICATIONS:   Current Discharge Medication List    START taking these medications   Details  oseltamivir (TAMIFLU) 30 MG capsule Take 1 capsule (30 mg total) by mouth every Monday, Wednesday, and Friday at 6 PM. Qty: 3 capsule, Refills: 0    polyethylene glycol (MIRALAX / GLYCOLAX) packet Take 17 g by mouth daily as needed for moderate constipation. Qty: 14 each, Refills: 0      CONTINUE these medications which have NOT CHANGED   Details  amLODipine (NORVASC) 10 MG tablet Take 1 tablet (10 mg total) by mouth daily. Qty: 90 tablet, Refills: 1    Ascorbic Acid (VITAMIN C) 100 MG tablet Take 100 mg by mouth daily.      aspirin 81  MG tablet Take 81 mg by mouth daily.    Blood Glucose Monitoring Suppl (ONE TOUCH ULTRA SYSTEM KIT) W/DEVICE KIT 1 kit by Does not apply route once. Qty: 1 each, Refills: 0    carvedilol (COREG) 12.5 MG tablet Take 1 tablet (12.5 mg total) by mouth 2 (two) times daily with a meal.    Cholecalciferol (VITAMIN D3) 1000 UNITS CAPS Take 1 capsule by mouth 2 (two) times daily.      cyclobenzaprine (FLEXERIL) 5 MG tablet Take 1-2 tablets (5-10 mg total) by mouth 2 (two) times daily as needed for muscle spasms. Qty: 40 tablet, Refills: 0    diclofenac  sodium (VOLTAREN) 1 % GEL Apply 1 application topically 3 (three) times daily. Qty: 1 Tube, Refills: 1    dorzolamide-timolol (COSOPT) 22.3-6.8 MG/ML ophthalmic solution Place 1 drop into both eyes 3 (three) times daily.      esomeprazole (NEXIUM) 40 MG capsule Take 1 capsule (40 mg total) by mouth daily. Qty: 30 capsule, Refills: 3    furosemide (LASIX) 40 MG tablet Take 1 tablet (40 mg total) by mouth 2 (two) times daily. Qty: 180 tablet, Refills: 2    glucose blood test strip Check sugars once daily Qty: 100 each, Refills: 11    hydrOXYzine (ATARAX/VISTARIL) 25 MG tablet Take 1 tablet (25 mg total) by mouth 2 (two) times daily as needed.    lisinopril (PRINIVIL,ZESTRIL) 10 MG tablet Take 5 mg by mouth daily.    meclizine (ANTIVERT) 25 MG tablet Take 1 tablet (25 mg total) by mouth 3 (three) times daily as needed. Qty: 30 tablet, Refills: 1    Multiple Vitamin (MULTIVITAMIN) tablet Take 1 tablet by mouth daily.      NEOMYCIN-POLYMYXIN-HYDROCORTISONE (CORTISPORIN) 1 % SOLN otic solution Apply 1-2 drops to toe BID after soaking Qty: 10 mL, Refills: 1    pravastatin (PRAVACHOL) 80 MG tablet Take 1 tablet by mouth  every evening Qty: 90 tablet, Refills: 2    sevelamer carbonate (RENVELA) 800 MG tablet Take 800 mg by mouth 3 (three) times daily with meals.    sodium bicarbonate 650 MG tablet Take 1 tablet (650 mg total) by mouth 3 (three) times daily.    sucralfate (CARAFATE) 1 g tablet Take 1 tablet (1 g total) by mouth daily. Qty: 30 tablet, Refills: 0    travoprost, benzalkonium, (TRAVATAN) 0.004 % ophthalmic solution Place 1 drop into both eyes at bedtime.      traZODone (DESYREL) 50 MG tablet Take 50 mg by mouth at bedtime as needed.     vitamin E 100 UNIT capsule Take 400 Units by mouth daily.     cinacalcet (SENSIPAR) 30 MG tablet Take 30 mg by mouth daily. Reported on 12/07/2015      STOP taking these medications     polyethylene glycol powder (GLYCOLAX/MIRALAX)  powder               Today   CHIEF COMPLAINT:  Patient is doing well this point. She is ready for discharge   VITAL SIGNS:  Blood pressure 118/74, pulse 68, temperature 98 F (36.7 C), temperature source Oral, resp. rate 20, height '5\' 3"'  (1.6 m), weight 64.365 kg (141 lb 14.4 oz), SpO2 100 %.   REVIEW OF SYSTEMS:  Review of Systems  Constitutional: Negative for fever, chills and malaise/fatigue.  HENT: Negative for sore throat.   Eyes: Negative for blurred vision.  Respiratory: Negative for cough, hemoptysis, shortness of breath and wheezing.   Cardiovascular: Negative  for chest pain, palpitations and leg swelling.  Gastrointestinal: Negative for nausea, vomiting, abdominal pain, diarrhea and blood in stool.  Genitourinary: Negative for dysuria.  Musculoskeletal: Negative for back pain.  Neurological: Negative for dizziness, tremors and headaches.  Endo/Heme/Allergies: Does not bruise/bleed easily.     PHYSICAL EXAMINATION:  GENERAL:  65 y.o.-year-old patient lying in the bed with no acute distress.  NECK:  Supple, no jugular venous distention. No thyroid enlargement, no tenderness.  LUNGS: Normal breath sounds bilaterally, no wheezing, rales,rhonchi  No use of accessory muscles of respiration.  CARDIOVASCULAR: S1, S2 normal. No murmurs, rubs, or gallops.  ABDOMEN: Soft, non-tender, non-distended. Bowel sounds present. No organomegaly or mass.  EXTREMITIES: No pedal edema, cyanosis, or clubbing.  PSYCHIATRIC: The patient is alert and oriented x 3.  SKIN: No obvious rash, lesion, or ulcer.   DATA REVIEW:   CBC  Recent Labs Lab 12/08/15 0905  WBC 6.3  HGB 11.5*  HCT 34.8*  PLT 151    Chemistries   Recent Labs Lab 12/08/15 0905  NA 139  K 5.6*  CL 104  CO2 23  GLUCOSE 95  BUN 76*  CREATININE 9.34*  CALCIUM 8.9    Cardiac Enzymes  Recent Labs Lab 12/07/15 0012 12/07/15 0536 12/07/15 1135  TROPONINI <0.03 <0.03 <0.03    Microbiology  Results  '@[id number]' @  RADIOLOGY:  Mr Brain Wo Contrast  12/08/2015  CLINICAL DATA:  65 year old female with end-stage renal disease on dialysis. Syncope at work. Initial encounter. EXAM: MRI HEAD WITHOUT CONTRAST TECHNIQUE: Multiplanar, multiecho pulse sequences of the brain and surrounding structures were obtained without intravenous contrast. COMPARISON:  Head CT without contrast 12/07/2015. [facility] Brain MRI 11/28/2014. FINDINGS: Major intracranial vascular flow voids are stable. Stable cerebral volume. No restricted diffusion to suggest acute infarction. No midline shift, mass effect, evidence of mass lesion, ventriculomegaly, extra-axial collection or acute intracranial hemorrhage. Cervicomedullary junction and pituitary are within normal limits. Patchy and confluent bilateral cerebral white matter T2 and FLAIR hyperintensity is stable since 2016 and in a nonspecific configuration. No cortical encephalomalacia identified. Deep gray matter nuclei and cerebellum are within normal limits. Stable mild patchy T2 hyperintensity in the pons. No chronic cerebral blood products identified. Trace mastoid fluid is stable or regressed. Stable visualized internal auditory structures. Increased mild paranasal sinus mucosal thickening bilaterally, small fluid level in the left maxillary sinus is new. Negative orbit and scalp soft tissues. Stable and negative visualized cervical spine. IMPRESSION: 1. No acute intracranial abnormality. Stable noncontrast MRI appearance of the brain since 2016 with moderate for age nonspecific cerebral white matter signal changes, favor small vessel disease related given end-stage renal disease. 2. New mild paranasal sinus inflammation suggestive of acute sinusitis in the appropriate clinical setting. Electronically Signed   By: Genevie Ann M.D.   On: 12/08/2015 17:22      Management plans discussed with the patient and she is in agreement. Stable for discharge home  Patient  should follow up with PCP in 1 week  CODE STATUS:     Code Status Orders        Start     Ordered   12/07/15 0504  Full code   Continuous     12/07/15 0503    Code Status History    Date Active Date Inactive Code Status Order ID Comments User Context   This patient has a current code status but no historical code status.      TOTAL TIME TAKING CARE OF THIS PATIENT:  35 minutes.    Note: This dictation was prepared with Dragon dictation along with smaller phrase technology. Any transcriptional errors that result from this process are unintentional.  Jilliana Burkes M.D on 12/09/2015 at 10:25 AM  Between 7am to 6pm - Pager - [phone] After 6pm go to [email or web address] - password [email or web address] [facility]  [facility]  Office  [phone]  CC: Primary care physician; Ria Bush, MD

## 2015-12-09 NOTE — Progress Notes (Signed)
Subjective: No further syncopal events noted.  Patient with no new complaints.   Objective: Current vital signs: BP 118/74 mmHg  Pulse 68  Temp(Src) 98 F (36.7 C) (Oral)  Resp 20  Ht 5\' 3"  (1.6 m)  Wt 64.365 kg (141 lb 14.4 oz)  BMI 25.14 kg/m2  SpO2 100%  LMP  (LMP Unknown) Vital signs in last 24 hours: Temp:  [98 F (36.7 C)-100 F (37.8 C)] 98 F (36.7 C) (03/09 0422) Pulse Rate:  [58-77] 68 (03/09 0422) Resp:  [14-21] 20 (03/09 0422) BP: (87-144)/(50-77) 118/74 mmHg (03/09 0845) SpO2:  [92 %-100 %] 100 % (03/09 0422) Weight:  [64.365 kg (141 lb 14.4 oz)] 64.365 kg (141 lb 14.4 oz) (03/08 1441)  Intake/Output from previous day: 03/08 0701 - 03/09 0700 In: -  Out: 1200 [Urine:200] Intake/Output this shift: Total I/O In: -  Out: 150 [Urine:150] Nutritional status: Diet Carb Modified Fluid consistency:: Thin; Room service appropriate?: Yes  Neurologic Exam: Mental Status: Alert, oriented, thought content appropriate. Speech fluent without evidence of aphasia. Able to follow 3 step commands without difficulty. Cranial Nerves: II: Discs flat bilaterally; Visual fields grossly normal, pupils equal, round, reactive to light and accommodation III,IV, VI: ptosis not present, extra-ocular motions intact bilaterally V,VII: smile symmetric, facial light touch sensation normal bilaterally VIII: hearing normal bilaterally IX,X: gag reflex present XI: bilateral shoulder shrug XII: midline tongue extension Motor: 5/5 throughout Sensory: Pinprick and light touch intact throughout, bilaterally Deep Tendon Reflexes: 2+ in the upper extremities, trace at the knees and absent at the ankles   Lab Results: Basic Metabolic Panel:  Recent Labs Lab 12/07/15 0012 12/07/15 0536 12/08/15 0905  NA 139 140 139  K 4.4 4.7 5.6*  CL 102 104 104  CO2 25 27 23   GLUCOSE 103* 94 95  BUN 46* 51* 76*  CREATININE 6.62* 6.99* 9.34*  CALCIUM 9.3 9.4 8.9  PHOS  --   --  3.9    Liver  Function Tests:  Recent Labs Lab 12/08/15 0905  ALBUMIN 3.6   No results for input(s): LIPASE, AMYLASE in the last 168 hours. No results for input(s): AMMONIA in the last 168 hours.  CBC:  Recent Labs Lab 12/07/15 0012 12/07/15 0536 12/08/15 0905  WBC 7.5 6.6 6.3  HGB 11.6* 11.6* 11.5*  HCT 35.8 35.5 34.8*  MCV 82.8 82.2 82.3  PLT 139* 143* 151    Cardiac Enzymes:  Recent Labs Lab 12/07/15 0012 12/07/15 0536 12/07/15 1135  TROPONINI <0.03 <0.03 <0.03    Lipid Panel: No results for input(s): CHOL, TRIG, HDL, CHOLHDL, VLDL, LDLCALC in the last 168 hours.  CBG:  Recent Labs Lab 12/07/15 2114 12/08/15 0748 12/08/15 1613 12/08/15 2109 12/09/15 0730  GLUCAP 112* 80 187* 2 133*    Microbiology: Results for orders placed or performed during the hospital encounter of 12/06/15  MRSA PCR Screening     Status: None   Collection Time: 12/07/15  6:55 AM  Result Value Ref Range Status   MRSA by PCR NEGATIVE NEGATIVE Final    Comment:        The GeneXpert MRSA Assay (FDA approved for NASAL specimens only), is one component of a comprehensive MRSA colonization surveillance program. It is not intended to diagnose MRSA infection nor to guide or monitor treatment for MRSA infections.     Coagulation Studies: No results for input(s): LABPROT, INR in the last 72 hours.  Imaging: Mr Herby Abraham Contrast  12/08/2015  CLINICAL DATA:  65 year old female  with end-stage renal disease on dialysis. Syncope at work. Initial encounter. EXAM: MRI HEAD WITHOUT CONTRAST TECHNIQUE: Multiplanar, multiecho pulse sequences of the brain and surrounding structures were obtained without intravenous contrast. COMPARISON:  Head CT without contrast 12/07/2015. [facility] Brain MRI 11/28/2014. FINDINGS: Major intracranial vascular flow voids are stable. Stable cerebral volume. No restricted diffusion to suggest acute infarction. No midline shift, mass effect, evidence of mass lesion,  ventriculomegaly, extra-axial collection or acute intracranial hemorrhage. Cervicomedullary junction and pituitary are within normal limits. Patchy and confluent bilateral cerebral white matter T2 and FLAIR hyperintensity is stable since 2016 and in a nonspecific configuration. No cortical encephalomalacia identified. Deep gray matter nuclei and cerebellum are within normal limits. Stable mild patchy T2 hyperintensity in the pons. No chronic cerebral blood products identified. Trace mastoid fluid is stable or regressed. Stable visualized internal auditory structures. Increased mild paranasal sinus mucosal thickening bilaterally, small fluid level in the left maxillary sinus is new. Negative orbit and scalp soft tissues. Stable and negative visualized cervical spine. IMPRESSION: 1. No acute intracranial abnormality. Stable noncontrast MRI appearance of the brain since 2016 with moderate for age nonspecific cerebral white matter signal changes, favor small vessel disease related given end-stage renal disease. 2. New mild paranasal sinus inflammation suggestive of acute sinusitis in the appropriate clinical setting. Electronically Signed   By: Genevie Ann M.D.   On: 12/08/2015 17:22    Medications:  I have reviewed the patient's current medications. Scheduled: . amLODipine  10 mg Oral Daily  . aspirin  81 mg Oral Daily  . carvedilol  12.5 mg Oral BID WC  . cholecalciferol  1,000 Units Oral BID  . cinacalcet  30 mg Oral Daily  . docusate sodium  100 mg Oral BID  . dorzolamide-timolol  1 drop Both Eyes TID  . furosemide  40 mg Oral BID  . heparin  5,000 Units Subcutaneous 3 times per day  . insulin aspart  0-5 Units Subcutaneous QHS  . insulin aspart  0-9 Units Subcutaneous TID WC  . latanoprost  1 drop Both Eyes QHS  . lisinopril  5 mg Oral Daily  . oseltamivir  30 mg Oral Q M,W,F-1800  . pantoprazole  40 mg Oral Daily  . pravastatin  80 mg Oral Daily  . sevelamer carbonate  800 mg Oral TID WC  .  sodium bicarbonate  650 mg Oral TID  . sucralfate  1 g Oral Daily    Assessment/Plan: Patient without further syncopal events.  MRI of the brain personally reviewed and shows no acute changes.  EEG pending but may be done as an outpatient.  Unable to be performed previously due to the fact that the patient was in dialysis.  Anticonvulsant therapy not indicated at this time.      Alexis Goodell, MD Neurology [phone] 12/09/2015  11:03 AM

## 2015-12-09 NOTE — Discharge Instructions (Signed)

## 2015-12-09 NOTE — Progress Notes (Signed)
Patient discharged via wheelchair and private vehicle. IV removed and catheter intact. All discharge instructions given and patient verbalizes understanding. Tele removed and returned. Prescriptions given to patient. Patient wearing mask upon discharge. No distress noted.

## 2015-12-09 NOTE — Progress Notes (Signed)
[facility]  ROUNDING NOTE   Subjective:   Hemodialysis yesterday. Tolerated treatment  Objective:  Vital signs in last 24 hours:  Temp:  [98 F (36.7 C)-100 F (37.8 C)] 98.5 F (36.9 C) (03/09 1116) Pulse Rate:  [58-77] 70 (03/09 1116) Resp:  [14-21] 18 (03/09 1116) BP: (87-144)/(50-77) 103/55 mmHg (03/09 1116) SpO2:  [92 %-100 %] 97 % (03/09 1116) Weight:  [64.365 kg (141 lb 14.4 oz)] 64.365 kg (141 lb 14.4 oz) (03/08 1441)  Weight change:  Filed Weights   12/07/15 0000 12/08/15 1015 12/08/15 1441  Weight: 68 kg (149 lb 14.6 oz) 67.8 kg (149 lb 7.6 oz) 64.365 kg (141 lb 14.4 oz)    Intake/Output: I/O last 3 completed shifts: In: -  Out: 1200 [Urine:200; Other:1000]   Intake/Output this shift:  Total I/O In: -  Out: 150 [Urine:150]  Physical Exam: General: NAD,   Head: Normocephalic, atraumatic. Moist oral mucosal membranes  Eyes: Anicteric, PERRL  Neck: Supple, trachea midline  Lungs:  Clear to auscultation  Heart: Regular rate and rhythm  Abdomen:  Soft, nontender,   Extremities: no peripheral edema.  Neurologic: Nonfocal, moving all four extremities  Skin: No lesions  Access: Left AVF    Basic Metabolic Panel:  Recent Labs Lab 12/07/15 0012 12/07/15 0536 12/08/15 0905  NA 139 140 139  K 4.4 4.7 5.6*  CL 102 104 104  CO2 25 27 23   GLUCOSE 103* 94 95  BUN 46* 51* 76*  CREATININE 6.62* 6.99* 9.34*  CALCIUM 9.3 9.4 8.9  PHOS  --   --  3.9    Liver Function Tests:  Recent Labs Lab 12/08/15 0905  ALBUMIN 3.6   No results for input(s): LIPASE, AMYLASE in the last 168 hours. No results for input(s): AMMONIA in the last 168 hours.  CBC:  Recent Labs Lab 12/07/15 0012 12/07/15 0536 12/08/15 0905  WBC 7.5 6.6 6.3  HGB 11.6* 11.6* 11.5*  HCT 35.8 35.5 34.8*  MCV 82.8 82.2 82.3  PLT 139* 143* 151    Cardiac Enzymes:  Recent Labs Lab 12/07/15 0012 12/07/15 0536 12/07/15 1135  TROPONINI <0.03 <0.03 <0.03     BNP: Invalid input(s): POCBNP  CBG:  Recent Labs Lab 12/08/15 0748 12/08/15 1613 12/08/15 2109 12/09/15 0730 12/09/15 1113  GLUCAP 80 187* 4 133* 115*    Microbiology: Results for orders placed or performed during the hospital encounter of 12/06/15  MRSA PCR Screening     Status: None   Collection Time: 12/07/15  6:55 AM  Result Value Ref Range Status   MRSA by PCR NEGATIVE NEGATIVE Final    Comment:        The GeneXpert MRSA Assay (FDA approved for NASAL specimens only), is one component of a comprehensive MRSA colonization surveillance program. It is not intended to diagnose MRSA infection nor to guide or monitor treatment for MRSA infections.     Coagulation Studies: No results for input(s): LABPROT, INR in the last 72 hours.  Urinalysis:  Recent Labs  12/07/15 0118  COLORURINE STRAW*  LABSPEC 1.005  PHURINE 9.0*  GLUCOSEU 50*  HGBUR 1+*  BILIRUBINUR NEGATIVE  KETONESUR NEGATIVE  PROTEINUR 30*  NITRITE NEGATIVE  LEUKOCYTESUR NEGATIVE      Imaging: Mr Herby Abraham Contrast  12/08/2015  CLINICAL DATA:  65 year old female with end-stage renal disease on dialysis. Syncope at work. Initial encounter. EXAM: MRI HEAD WITHOUT CONTRAST TECHNIQUE: Multiplanar, multiecho pulse sequences of the brain and surrounding structures were obtained without intravenous  contrast. COMPARISON:  Head CT without contrast 12/07/2015. [facility] Brain MRI 11/28/2014. FINDINGS: Major intracranial vascular flow voids are stable. Stable cerebral volume. No restricted diffusion to suggest acute infarction. No midline shift, mass effect, evidence of mass lesion, ventriculomegaly, extra-axial collection or acute intracranial hemorrhage. Cervicomedullary junction and pituitary are within normal limits. Patchy and confluent bilateral cerebral white matter T2 and FLAIR hyperintensity is stable since 2016 and in a nonspecific configuration. No cortical encephalomalacia identified.  Deep gray matter nuclei and cerebellum are within normal limits. Stable mild patchy T2 hyperintensity in the pons. No chronic cerebral blood products identified. Trace mastoid fluid is stable or regressed. Stable visualized internal auditory structures. Increased mild paranasal sinus mucosal thickening bilaterally, small fluid level in the left maxillary sinus is new. Negative orbit and scalp soft tissues. Stable and negative visualized cervical spine. IMPRESSION: 1. No acute intracranial abnormality. Stable noncontrast MRI appearance of the brain since 2016 with moderate for age nonspecific cerebral white matter signal changes, favor small vessel disease related given end-stage renal disease. 2. New mild paranasal sinus inflammation suggestive of acute sinusitis in the appropriate clinical setting. Electronically Signed   By: Genevie Ann M.D.   On: 12/08/2015 17:22     Medications:     . amLODipine  10 mg Oral Daily  . aspirin  81 mg Oral Daily  . carvedilol  12.5 mg Oral BID WC  . cholecalciferol  1,000 Units Oral BID  . cinacalcet  30 mg Oral Daily  . docusate sodium  100 mg Oral BID  . dorzolamide-timolol  1 drop Both Eyes TID  . furosemide  40 mg Oral BID  . heparin  5,000 Units Subcutaneous 3 times per day  . insulin aspart  0-5 Units Subcutaneous QHS  . insulin aspart  0-9 Units Subcutaneous TID WC  . latanoprost  1 drop Both Eyes QHS  . lisinopril  5 mg Oral Daily  . oseltamivir  30 mg Oral Q M,W,F-1800  . pantoprazole  40 mg Oral Daily  . pravastatin  80 mg Oral Daily  . sevelamer carbonate  800 mg Oral TID WC  . sodium bicarbonate  650 mg Oral TID  . sucralfate  1 g Oral Daily   cyclobenzaprine, guaiFENesin, hydrOXYzine, meclizine, ondansetron (ZOFRAN) IV, polyethylene glycol, traZODone  Assessment/ Plan:  Kristen Kirk is a 65 y.o. black female with diabetes mellitus type II, hypertension, hyperlipidemia, glaucoma, GERD, anemia and incontinence, who was admitted to [facility] on  12/06/2015.   MWF [facility]  1. End Stage Renal Disease: MWF. Hemodialysis yesterday. Continue MWF  2. Hypertension: admitted with dizziness.  - amlodipine, carvedilol, furosemide, lisinopril  3. Secondary Hyperparathyroidism and vitamin D deficiency - cinacalcet, vitamin D - sevelamer with meals.   4. Anemia with chronic kidney disease: Hemoglobin 11.5 - [facility] as outpatient.    LOS:  Jahvon Gosline, Lurena Nida 3/9/201711:38 AM

## 2015-12-13 ENCOUNTER — Encounter: Payer: Self-pay | Admitting: Podiatry

## 2015-12-13 ENCOUNTER — Telehealth: Payer: Self-pay | Admitting: *Deleted

## 2015-12-13 ENCOUNTER — Ambulatory Visit (INDEPENDENT_AMBULATORY_CARE_PROVIDER_SITE_OTHER): Payer: 59 | Admitting: Podiatry

## 2015-12-13 VITALS — BP 132/67 | HR 64 | Resp 16

## 2015-12-13 DIAGNOSIS — L600 Ingrowing nail: Secondary | ICD-10-CM

## 2015-12-13 NOTE — Telephone Encounter (Signed)
Transition Care Management Follow-up Telephone Call   Date discharged? 12/09/15   How have you been since you were released from the hospital? Patient is doing well, still weak but improving.   Do you understand why you were in the hospital? yes   Do you understand the discharge instructions? yes   Where were you discharged to? home   Items Reviewed:  Medications reviewed: yes  Allergies reviewed: yes  Dietary changes reviewed: no  Referrals reviewed: cardiology, neurology   Functional Questionnaire:   Activities of Daily Living (ADLs):   She states they are independent in the following: ambulation, bathing and hygiene, feeding, continence, grooming, toileting and dressing States they require assistance with the following: none   Any transportation issues/concerns?: no   Any patient concerns? yes, medication changes - patient will bring new meds to f/u for review by pcp   Confirmed importance and date/time of follow-up visits scheduled yes, 12/16/15 @ 11  Provider Appointment booked with Ria Bush, MD  Confirmed with patient if condition begins to worsen call PCP or go to the ER.  Patient was given the office number and encouraged to call back with question or concerns.  : yes

## 2015-12-13 NOTE — Telephone Encounter (Signed)
Transitional care call attempted.  Patient's husband declined to talk.  He will give her a message to return my call.

## 2015-12-13 NOTE — Progress Notes (Signed)
She presents for follow-up of matrixectomy tibial border hallux left. She states that she was recently in the hospital for a while was unable to wear shoes and she states her toe feels so much better now. She states that it has been bothering her since she's been out of the hospital.  Objective: Vital signs are stable she's alert and oriented 3 no erythema edema cellulitis drainage or odor hallux left. At this point with no pain on palpation to this area I recommended that we wait on performing any type of nail avulsion. Should it begin to hurt we will go ahead and press forward with a total nail avulsion and matrixectomy.  Assessment: Well-healing matrixectomy tibial border hallux left.  Plan: Consider total nail matrixectomy hallux left left I will follow up with her in 2 weeks if necessary.

## 2015-12-16 ENCOUNTER — Ambulatory Visit: Payer: 59 | Admitting: Family Medicine

## 2015-12-29 ENCOUNTER — Ambulatory Visit: Payer: 59 | Admitting: Family Medicine

## 2015-12-31 ENCOUNTER — Ambulatory Visit (INDEPENDENT_AMBULATORY_CARE_PROVIDER_SITE_OTHER): Payer: 59 | Admitting: Family Medicine

## 2015-12-31 ENCOUNTER — Encounter: Payer: Self-pay | Admitting: Family Medicine

## 2015-12-31 ENCOUNTER — Ambulatory Visit: Payer: 59 | Admitting: Family Medicine

## 2015-12-31 VITALS — BP 114/64 | HR 68 | Temp 98.7°F | Wt 147.8 lb

## 2015-12-31 DIAGNOSIS — R55 Syncope and collapse: Secondary | ICD-10-CM

## 2015-12-31 DIAGNOSIS — R109 Unspecified abdominal pain: Secondary | ICD-10-CM | POA: Diagnosis not present

## 2015-12-31 DIAGNOSIS — N186 End stage renal disease: Secondary | ICD-10-CM

## 2015-12-31 DIAGNOSIS — J111 Influenza due to unidentified influenza virus with other respiratory manifestations: Secondary | ICD-10-CM

## 2015-12-31 DIAGNOSIS — Z992 Dependence on renal dialysis: Secondary | ICD-10-CM

## 2015-12-31 LAB — POC URINALSYSI DIPSTICK (AUTOMATED)
Bilirubin, UA: NEGATIVE
Glucose, UA: NEGATIVE
KETONES UA: NEGATIVE
Leukocytes, UA: NEGATIVE
Nitrite, UA: NEGATIVE
PH UA: 8
SPEC GRAV UA: 1.01
UROBILINOGEN UA: 0.2

## 2015-12-31 NOTE — Assessment & Plan Note (Signed)
Back on regular work and dialysis schedule.

## 2015-12-31 NOTE — Assessment & Plan Note (Signed)
Has fully resolved from this.

## 2015-12-31 NOTE — Patient Instructions (Addendum)
I'm glad we're doing better. Let's check urinalysis today - we will call you with results.  Good to see you today, call us with quesitons. Return in 3 months for physical.

## 2015-12-31 NOTE — Assessment & Plan Note (Addendum)
?  renal pain vs MSK pain.  Exam today WNL.  Check UA today - 1+ blood and protein.  Micro overall reassuring. UCx sent.

## 2015-12-31 NOTE — Progress Notes (Signed)
BP 114/64 mmHg  Pulse 68  Temp(Src) 98.7 F (37.1 C) (Oral)  Wt 147 lb 12 oz (67.019 kg)  LMP  (LMP Unknown)   CC: hosp f/u visit  Subjective:    Patient ID: Kristen Kirk, female    DOB: 1950/10/21, 65 y.o.   MRN: [id number]  HPI: Kristen Kirk is a 65 y.o. female presenting on 12/31/2015 for Follow-up   Hospitalized with vasovagal syncope in setting of influenza (mild cough, no body aches or fevers) - treated with tamiflu. Saw cardiology, nephrology, and neurology. records reviewed. MRI brain without acute changes. EEG not completed - rec outpatient f/u. Pt doesn't think she needs this.   Back on HD routine MWF. No concerns identified there.   Back to work, works third shift. Since she's been at work, noticing R flank pain. Stays on her feet all night. No fevers/chills, urinary symptoms or nausea/vomiting.   Admission date: 12/06/2015 Discharge date: 12/09/2015 F/u phone call: 12/13/2015 Pt rescheduled appt from 3/16 to today.  Discharge diagnosis: Principal Problem:  Cardiogenic Syncope Active Problems:  ESRD on hemodialysis ([place])  T2DM (type 2 diabetes mellitus) ([place])  URI (upper respiratory infection)  Controlled type 2 diabetes mellitus with diabetic nephropathy, without long-term current use of insulin ([place])  Essential hypertension  Relevant past medical, surgical, family and social history reviewed and updated as indicated. Interim medical history since our last visit reviewed. Allergies and medications reviewed and updated. Current Outpatient Prescriptions on File Prior to Visit  Medication Sig  . amLODipine (NORVASC) 10 MG tablet Take 1 tablet (10 mg total) by mouth daily.  . Ascorbic Acid (VITAMIN C) 100 MG tablet Take 100 mg by mouth daily.    Marland Kitchen aspirin 81 MG tablet Take 81 mg by mouth daily.  . Blood Glucose Monitoring Suppl (ONE TOUCH ULTRA SYSTEM KIT) W/DEVICE KIT 1 kit by Does not apply route once.  . carvedilol (COREG) 12.5 MG tablet Take 1  tablet (12.5 mg total) by mouth 2 (two) times daily with a meal.  . Cholecalciferol (VITAMIN D3) 1000 UNITS CAPS Take 1 capsule by mouth 2 (two) times daily.    . cyclobenzaprine (FLEXERIL) 5 MG tablet Take 1-2 tablets (5-10 mg total) by mouth 2 (two) times daily as needed for muscle spasms.  . diclofenac sodium (VOLTAREN) 1 % GEL Apply 1 application topically 3 (three) times daily.  . dorzolamide-timolol (COSOPT) 22.3-6.8 MG/ML ophthalmic solution Place 1 drop into both eyes 3 (three) times daily.    Marland Kitchen esomeprazole (NEXIUM) 40 MG capsule Take 1 capsule (40 mg total) by mouth daily.  . furosemide (LASIX) 40 MG tablet Take 1 tablet (40 mg total) by mouth 2 (two) times daily.  Marland Kitchen glucose blood test strip Check sugars once daily  . hydrOXYzine (ATARAX/VISTARIL) 25 MG tablet Take 1 tablet (25 mg total) by mouth 2 (two) times daily as needed.  Marland Kitchen lisinopril (PRINIVIL,ZESTRIL) 10 MG tablet Take 5 mg by mouth daily.  . meclizine (ANTIVERT) 25 MG tablet Take 1 tablet (25 mg total) by mouth 3 (three) times daily as needed.  . Multiple Vitamin (MULTIVITAMIN) tablet Take 1 tablet by mouth daily.    . polyethylene glycol (MIRALAX / GLYCOLAX) packet Take 17 g by mouth daily as needed for moderate constipation.  . pravastatin (PRAVACHOL) 80 MG tablet Take 1 tablet by mouth  every evening  . sevelamer carbonate (RENVELA) 800 MG tablet Take 800 mg by mouth 3 (three) times daily with meals.  . sucralfate (CARAFATE) 1  g tablet Take 1 tablet (1 g total) by mouth daily.  . travoprost, benzalkonium, (TRAVATAN) 0.004 % ophthalmic solution Place 1 drop into both eyes at bedtime.    . traZODone (DESYREL) 50 MG tablet Take 50 mg by mouth at bedtime as needed.   . vitamin E 100 UNIT capsule Take 400 Units by mouth daily.   . cinacalcet (SENSIPAR) 30 MG tablet Take 30 mg by mouth daily. Reported on 12/31/2015  . sodium bicarbonate 650 MG tablet Take 1 tablet (650 mg total) by mouth 3 (three) times daily.   No current  facility-administered medications on file prior to visit.    Review of Systems Per HPI unless specifically indicated in ROS section     Objective:    BP 114/64 mmHg  Pulse 68  Temp(Src) 98.7 F (37.1 C) (Oral)  Wt 147 lb 12 oz (67.019 kg)  LMP  (LMP Unknown)  Wt Readings from Last 3 Encounters:  12/31/15 147 lb 12 oz (67.019 kg)  12/08/15 141 lb 14.4 oz (64.365 kg)  10/29/15 150 lb 4 oz (68.153 kg)    Physical Exam  Constitutional: She appears well-developed and well-nourished. No distress.  HENT:  Mouth/Throat: Oropharynx is clear and moist. No oropharyngeal exudate.  Cardiovascular: Normal rate, regular rhythm, normal heart sounds and intact distal pulses.   No murmur heard. Pulmonary/Chest: Effort normal and breath sounds normal. No respiratory distress. She has no wheezes. She has no rales.  Abdominal: Soft. Normal appearance and bowel sounds are normal. She exhibits no distension and no mass. There is no hepatosplenomegaly. There is no tenderness. There is no rebound, no guarding and no CVA tenderness.  Musculoskeletal: She exhibits no edema.  Skin: Skin is warm and dry. No rash noted.  Psychiatric: She has a normal mood and affect.  Nursing note and vitals reviewed.  Results for orders placed or performed in visit on 12/31/15  POCT Urinalysis Dipstick (Automated)  Result Value Ref Range   Color, UA yellow    Clarity, UA clear    Glucose, UA neg    Bilirubin, UA neg    Ketones, UA neg    Spec Grav, UA 1.010    Blood, UA 1+    pH, UA 8.0    Protein, UA 1+    Urobilinogen, UA 0.2    Nitrite, UA neg    Leukocytes, UA Negative Negative      Assessment & Plan:   Problem List Items Addressed This Visit    ESRD (end stage renal disease) on dialysis ([place])    Back on regular work and dialysis schedule.      Syncope    Hospital records reviewed. In setting of influenza illness in ESRD patient. Anticipate vasovagal syncope, doubt will need EEG. If recurrent  syncope, will refer to neuro. Pt agrees with plan.      Right flank pain - Primary    ?renal pain vs MSK pain.  Exam today WNL.  Check UA today - 1+ blood and protein.  Micro overall reassuring. UCx sent.      Relevant Orders   Urine culture   RESOLVED: Influenza    Has fully resolved from this.          Follow up plan: Return in about 3 months (around 03/31/2016) for annual exam, prior fasting for blood work.

## 2015-12-31 NOTE — Progress Notes (Signed)
Pre visit review using our clinic review tool, if applicable. No additional management support is needed unless otherwise documented below in the visit note. 

## 2015-12-31 NOTE — Assessment & Plan Note (Addendum)
Hospital records reviewed. In setting of influenza illness in ESRD patient. Anticipate vasovagal syncope, doubt will need EEG. If recurrent syncope, will refer to neuro. Pt agrees with plan.

## 2016-01-02 LAB — URINE CULTURE
Colony Count: NO GROWTH
ORGANISM ID, BACTERIA: NO GROWTH

## 2016-01-03 ENCOUNTER — Ambulatory Visit: Payer: 59 | Admitting: Podiatry

## 2016-01-19 ENCOUNTER — Other Ambulatory Visit: Payer: Self-pay

## 2016-01-19 NOTE — Telephone Encounter (Signed)
Pt request refill carvedilol 12.5 mg # 30 to [facility] until pt gets mail order refill. Pt said she has been taking carvedilol 12.5 mg bid. Is on current med list but do not see where been filled by Dr Darnell Level. Pt cannot find doctor's name on med bottle that she has at home now. The bottle pt has was dispensed on 08/30/15 for # 180; pt should have already run out of med. Pt does not know why still has medication in bottle.Please advise. Pt last seen 12/31/15 for f/u.

## 2016-01-20 MED ORDER — CARVEDILOL 12.5 MG PO TABS: 12.5000 mg | ORAL_TABLET | Freq: Two times a day (BID) | ORAL | Status: DC

## 2016-01-20 NOTE — Telephone Encounter (Signed)
plz notify 1 mo supply sent in locally.

## 2016-01-20 NOTE — Telephone Encounter (Signed)
Pt left v/m requesting cb about status of carvedilol refill.

## 2016-01-21 NOTE — Telephone Encounter (Signed)
Patient advised.

## 2016-02-01 ENCOUNTER — Emergency Department
Admission: EM | Admit: 2016-02-01 | Discharge: 2016-02-01 | Disposition: A | Discharge status: Home / Self Care | Payer: 59 | Attending: Emergency Medicine | Admitting: Emergency Medicine

## 2016-02-01 ENCOUNTER — Encounter: Payer: Self-pay | Admitting: Medical Oncology

## 2016-02-01 DIAGNOSIS — J029 Acute pharyngitis, unspecified: Secondary | ICD-10-CM

## 2016-02-01 DIAGNOSIS — N186 End stage renal disease: Secondary | ICD-10-CM | POA: Diagnosis not present

## 2016-02-01 DIAGNOSIS — Z87891 Personal history of nicotine dependence: Secondary | ICD-10-CM | POA: Insufficient documentation

## 2016-02-01 DIAGNOSIS — J0120 Acute ethmoidal sinusitis, unspecified: Secondary | ICD-10-CM | POA: Diagnosis not present

## 2016-02-01 DIAGNOSIS — Z992 Dependence on renal dialysis: Secondary | ICD-10-CM | POA: Diagnosis not present

## 2016-02-01 DIAGNOSIS — E785 Hyperlipidemia, unspecified: Secondary | ICD-10-CM | POA: Diagnosis not present

## 2016-02-01 DIAGNOSIS — I120 Hypertensive chronic kidney disease with stage 5 chronic kidney disease or end stage renal disease: Secondary | ICD-10-CM | POA: Insufficient documentation

## 2016-02-01 DIAGNOSIS — E1122 Type 2 diabetes mellitus with diabetic chronic kidney disease: Secondary | ICD-10-CM | POA: Diagnosis not present

## 2016-02-01 DIAGNOSIS — Z79899 Other long term (current) drug therapy: Secondary | ICD-10-CM | POA: Diagnosis not present

## 2016-02-01 DIAGNOSIS — M1990 Unspecified osteoarthritis, unspecified site: Secondary | ICD-10-CM | POA: Diagnosis not present

## 2016-02-01 DIAGNOSIS — Z7982 Long term (current) use of aspirin: Secondary | ICD-10-CM | POA: Insufficient documentation

## 2016-02-01 MED ORDER — AMOXICILLIN 500 MG PO TABS: 500.0000 mg | ORAL_TABLET | Freq: Three times a day (TID) | ORAL | Status: DC

## 2016-02-01 MED ORDER — CETIRIZINE HCL 10 MG PO CAPS: 10.0000 mg | ORAL_CAPSULE | Freq: Every day | ORAL | Status: DC

## 2016-02-01 MED ORDER — CIPROFLOXACIN HCL 0.3 % OP SOLN: 1.0000 [drp] | OPHTHALMIC | Status: AC

## 2016-02-01 MED ORDER — LIDOCAINE VISCOUS 2 % MT SOLN: 20.0000 mL | OROMUCOSAL | Status: DC | PRN

## 2016-02-01 NOTE — ED Notes (Signed)
Pt ambulatory to triage with reports of sore throat that began a couple days ago, today pt reports the pain in her throat has worsened but feels like there is a knot in her throat, pt denies sob. Pt reports she feels like it is difficult to swallow.

## 2016-02-01 NOTE — Discharge Instructions (Signed)
Pharyngitis Pharyngitis is redness, pain, and swelling (inflammation) of your pharynx.  CAUSES  Pharyngitis is usually caused by infection. Most of the time, these infections are from viruses (viral) and are part of a cold. However, sometimes pharyngitis is caused by bacteria (bacterial). Pharyngitis can also be caused by allergies. Viral pharyngitis may be spread from person to person by coughing, sneezing, and personal items or utensils (cups, forks, spoons, toothbrushes). Bacterial pharyngitis may be spread from person to person by more intimate contact, such as kissing.  SIGNS AND SYMPTOMS  Symptoms of pharyngitis include:   Sore throat.   Tiredness (fatigue).   Low-grade fever.   Headache.  Joint pain and muscle aches.  Skin rashes.  Swollen lymph nodes.  Plaque-like film on throat or tonsils (often seen with bacterial pharyngitis). DIAGNOSIS  Your health care provider will ask you questions about your illness and your symptoms. Your medical history, along with a physical exam, is often all that is needed to diagnose pharyngitis. Sometimes, a rapid strep test is done. Other lab tests may also be done, depending on the suspected cause.  TREATMENT  Viral pharyngitis will usually get better in 3-4 days without the use of medicine. Bacterial pharyngitis is treated with medicines that kill germs (antibiotics).  HOME CARE INSTRUCTIONS   Drink enough water and fluids to keep your urine clear or pale yellow.   Only take over-the-counter or prescription medicines as directed by your health care provider:   If you are prescribed antibiotics, make sure you finish them even if you start to feel better.   Do not take aspirin.   Get lots of rest.   Gargle with 8 oz of salt water ( tsp of salt per 1 qt of water) as often as every 1-2 hours to soothe your throat.   Throat lozenges (if you are not at risk for choking) or sprays may be used to soothe your throat. SEEK MEDICAL  CARE IF:   You have large, tender lumps in your neck.  You have a rash.  You cough up green, yellow-brown, or bloody spit. SEEK IMMEDIATE MEDICAL CARE IF:   Your neck becomes stiff.  You drool or are unable to swallow liquids.  You vomit or are unable to keep medicines or liquids down.  You have severe pain that does not go away with the use of recommended medicines.  You have trouble breathing (not caused by a stuffy nose). MAKE SURE YOU:   Understand these instructions.  Will watch your condition.  Will get help right away if you are not doing well or get worse.   This information is not intended to replace advice given to you by your health care provider. Make sure you discuss any questions you have with your health care provider.   Document Released: 09/18/2005 Document Revised: 07/09/2013 Document Reviewed: 05/26/2013 Elsevier Interactive Patient Education 2016 Elsevier Inc[employer]  Sinusitis, Adult Sinusitis is redness, soreness, and inflammation of the paranasal sinuses. Paranasal sinuses are air pockets within the bones of your face. They are located beneath your eyes, in the middle of your forehead, and above your eyes. In healthy paranasal sinuses, mucus is able to drain out, and air is able to circulate through them by way of your nose. However, when your paranasal sinuses are inflamed, mucus and air can become trapped. This can allow bacteria and other germs to grow and cause infection. Sinusitis can develop quickly and last only a short time (acute) or continue over a long period (  chronic). Sinusitis that lasts for more than 12 weeks is considered chronic. CAUSES Causes of sinusitis include:  Allergies.  Structural abnormalities, such as displacement of the cartilage that separates your nostrils (deviated septum), which can decrease the air flow through your nose and sinuses and affect sinus drainage.  Functional abnormalities, such as when the small hairs (cilia) that  line your sinuses and help remove mucus do not work properly or are not present. SIGNS AND SYMPTOMS Symptoms of acute and chronic sinusitis are the same. The primary symptoms are pain and pressure around the affected sinuses. Other symptoms include:  Upper toothache.  Earache.  Headache.  Bad breath.  Decreased sense of smell and taste.  A cough, which worsens when you are lying flat.  Fatigue.  Fever.  Thick drainage from your nose, which often is green and may contain pus (purulent).  Swelling and warmth over the affected sinuses. DIAGNOSIS Your health care provider will perform a physical exam. During your exam, your health care provider may perform any of the following to help determine if you have acute sinusitis or chronic sinusitis:  Look in your nose for signs of abnormal growths in your nostrils (nasal polyps).  Tap over the affected sinus to check for signs of infection.  View the inside of your sinuses using an imaging device that has a light attached (endoscope). If your health care provider suspects that you have chronic sinusitis, one or more of the following tests may be recommended:  Allergy tests.  Nasal culture. A sample of mucus is taken from your nose, sent to a lab, and screened for bacteria.  Nasal cytology. A sample of mucus is taken from your nose and examined by your health care provider to determine if your sinusitis is related to an allergy. TREATMENT Most cases of acute sinusitis are related to a viral infection and will resolve on their own within 10 days. Sometimes, medicines are prescribed to help relieve symptoms of both acute and chronic sinusitis. These may include pain medicines, decongestants, nasal steroid sprays, or saline sprays. However, for sinusitis related to a bacterial infection, your health care provider will prescribe antibiotic medicines. These are medicines that will help kill the bacteria causing the infection. Rarely,  sinusitis is caused by a fungal infection. In these cases, your health care provider will prescribe antifungal medicine. For some cases of chronic sinusitis, surgery is needed. Generally, these are cases in which sinusitis recurs more than 3 times per year, despite other treatments. HOME CARE INSTRUCTIONS  Drink plenty of water. Water helps thin the mucus so your sinuses can drain more easily.  Use a humidifier.  Inhale steam 3-4 times a day (for example, sit in the bathroom with the shower running).  Apply a warm, moist washcloth to your face 3-4 times a day, or as directed by your health care provider.  Use saline nasal sprays to help moisten and clean your sinuses.  Take medicines only as directed by your health care provider.  If you were prescribed either an antibiotic or antifungal medicine, finish it all even if you start to feel better. SEEK IMMEDIATE MEDICAL CARE IF:  You have increasing pain or severe headaches.  You have nausea, vomiting, or drowsiness.  You have swelling around your face.  You have vision problems.  You have a stiff neck.  You have difficulty breathing.   This information is not intended to replace advice given to you by your health care provider. Make sure you discuss any  questions you have with your health care provider.   Document Released: 09/18/2005 Document Revised: 10/09/2014 Document Reviewed: 10/03/2011 Elsevier Interactive Patient Education [employer].

## 2016-02-01 NOTE — ED Notes (Signed)
Per Dr Joni Fears pt to be seen in [facility].

## 2016-02-01 NOTE — ED Provider Notes (Signed)
[facility] Emergency Department Provider Note  ____________________________________________  Time seen: Approximately 8:15 PM  I have reviewed the triage vital signs and the nursing notes.   HISTORY  Chief Complaint Sore Throat   HPI Kristen Kirk is a 65 y.o. female who presents to the emergency department for evaluation of pharyngitis that has been progressively worsening over the past 4 days. She states that today it has been painful to Swallow. She Has Been Using Cough Drops with out Relief. She Denies Fever, Cough, Nausea, or Vomiting. She Does State That She Has Had Some Yellow Drainage from the Left Eye Is Worse upon Awakening in the Mornings. There Is No Pain or Feeling of Foreign Body in the Left Eye. She States That She Went to Urgent Care This Morning, but Was Not Prescribed Any Medications with the exception of an eyedrop.   Past Medical History  Diagnosis Date  . ESRD (end stage renal disease) ([place])     a. Followed @ [facility];  b. On dialysis since late 2015 (MWF).  . Diabetes mellitus type II     a. Currently diet controlled since losing weight.  Marland Kitchen HLD (hyperlipidemia)   . GERD (gastroesophageal reflux disease)   . Essential hypertension   . History of Urge Incontinence   . Vitamin D deficiency   . Glaucoma     a. L>R  . DJD (degenerative joint disease)   . Osteoarthritis   . Vaginal atrophy     a. vagifem caused spotting, normal TVS  . Anemia in chronic kidney disease     a. s/p aranesp 11/2013 now starting epo with HD 06/2014  . Syncope     a. 12/2015.  Marland Kitchen History of echocardiogram     a. 01/2015 Echo [facility]): EF ?55%, triv MR, mildly dil LA, nl PV.  Marland Kitchen History of stress test     a. 03/2013 My[facility]): EF 63%, no ischemia.    Patient Active Problem List   Diagnosis Date Noted  . Right flank pain 12/31/2015  . Syncope 12/07/2015  . Abdominal pain, epigastric 10/29/2015  . Lower back pain 10/29/2013  . Insomnia 01/14/2013  . Gassiness  08/22/2012  . Subacromial bursitis 08/22/2012  . Xerostomia 01/29/2012  . Healthcare maintenance 07/20/2011  . Vaginal atrophy 04/17/2011  . ESRD (end stage renal disease) on dialysis ([place])   . Controlled type 2 diabetes mellitus with diabetic nephropathy ([place])   . HLD (hyperlipidemia)   . GERD (gastroesophageal reflux disease)   . HTN (hypertension)   . Urge incontinence   . SYNCOPE 07/11/2010  . Encopresis(307.7) 10/26/2009  . Vitamin D deficiency 05/19/2009  . MUSCLE CRAMPS 02/22/2009  . ALOPECIA 10/05/2008  . VERTIGO 07/31/2008  . OSTEOARTHRITIS- EARLY T11-12, T12-L1  4/05 01/03/2007  . GLAUCOMA NOS 10/03/1999    Past Surgical History  Procedure Laterality Date  . Tubal ligation  1990s    bilateral  . Bladder suspension  2011    for stress incontinence-Dr. MacDiarmid  . Refractive surgery    . Left eye laser surgery  06/2011  . Colonoscopy  02/04/2009    normal, rec rpt 10 yrs    Current Outpatient Rx  Name  Route  Sig  Dispense  Refill  . amLODipine (NORVASC) 10 MG tablet   Oral   Take 1 tablet (10 mg total) by mouth daily.   90 tablet   1   . amoxicillin (AMOXIL) 500 MG tablet   Oral   Take 1 tablet (  500 mg total) by mouth 3 (three) times daily.   30 tablet   0   . Ascorbic Acid (VITAMIN C) 100 MG tablet   Oral   Take 100 mg by mouth daily.           Marland Kitchen aspirin 81 MG tablet   Oral   Take 81 mg by mouth daily.         . Blood Glucose Monitoring Suppl (ONE TOUCH ULTRA SYSTEM KIT) W/DEVICE KIT   Does not apply   1 kit by Does not apply route once.   1 each   0   . carvedilol (COREG) 12.5 MG tablet   Oral   Take 1 tablet (12.5 mg total) by mouth 2 (two) times daily with a meal.   60 tablet   0   . Cetirizine HCl 10 MG CAPS   Oral   Take 1 capsule (10 mg total) by mouth daily.   30 capsule   3   . Cholecalciferol (VITAMIN D3) 1000 UNITS CAPS   Oral   Take 1 capsule by mouth 2 (two) times daily.           . cinacalcet (SENSIPAR) 30 MG  tablet   Oral   Take 30 mg by mouth daily. Reported on 12/31/2015         . ciprofloxacin (CILOXAN) 0.3 % ophthalmic solution   Left Eye   Place 1 drop into the left eye every 4 (four) hours while awake. Every 4 hours, while awake, for the next 5 days.   5 mL   0   . cyclobenzaprine (FLEXERIL) 5 MG tablet   Oral   Take 1-2 tablets (5-10 mg total) by mouth 2 (two) times daily as needed for muscle spasms.   40 tablet   0   . diclofenac sodium (VOLTAREN) 1 % GEL   Topical   Apply 1 application topically 3 (three) times daily.   1 Tube   1   . dorzolamide-timolol (COSOPT) 22.3-6.8 MG/ML ophthalmic solution   Both Eyes   Place 1 drop into both eyes 3 (three) times daily.           Marland Kitchen esomeprazole (NEXIUM) 40 MG capsule   Oral   Take 1 capsule (40 mg total) by mouth daily.   30 capsule   3   . furosemide (LASIX) 40 MG tablet   Oral   Take 1 tablet (40 mg total) by mouth 2 (two) times daily.   180 tablet   2   . glucose blood test strip      Check sugars once daily   100 each   11   . hydrOXYzine (ATARAX/VISTARIL) 25 MG tablet   Oral   Take 1 tablet (25 mg total) by mouth 2 (two) times daily as needed.         . lidocaine (XYLOCAINE) 2 % solution   Mouth/Throat   Use as directed 20 mLs in the mouth or throat as needed (gargle at least 15 seconds then spit).   100 mL   0   . lisinopril (PRINIVIL,ZESTRIL) 10 MG tablet   Oral   Take 5 mg by mouth daily.         . meclizine (ANTIVERT) 25 MG tablet   Oral   Take 1 tablet (25 mg total) by mouth 3 (three) times daily as needed.   30 tablet   1   . Multiple Vitamin (MULTIVITAMIN) tablet   Oral  Take 1 tablet by mouth daily.           . polyethylene glycol (MIRALAX / GLYCOLAX) packet   Oral   Take 17 g by mouth daily as needed for moderate constipation.   14 each   0   . pravastatin (PRAVACHOL) 80 MG tablet      Take 1 tablet by mouth  every evening   90 tablet   2   . sevelamer carbonate  (RENVELA) 800 MG tablet   Oral   Take 800 mg by mouth 3 (three) times daily with meals.         . sodium bicarbonate 650 MG tablet   Oral   Take 1 tablet (650 mg total) by mouth 3 (three) times daily.         . sucralfate (CARAFATE) 1 g tablet   Oral   Take 1 tablet (1 g total) by mouth daily.   30 tablet   0   . travoprost, benzalkonium, (TRAVATAN) 0.004 % ophthalmic solution   Both Eyes   Place 1 drop into both eyes at bedtime.           . traZODone (DESYREL) 50 MG tablet   Oral   Take 50 mg by mouth at bedtime as needed.          . vitamin E 100 UNIT capsule   Oral   Take 400 Units by mouth daily.            Allergies Brimonidine and Shellfish-derived products  Family History  Problem Relation Age of Onset  . Stroke Brother   . Hyperlipidemia Brother   . Hypertension Brother   . Hyperlipidemia Brother   . Hypertension Brother   . Hyperlipidemia Sister   . Hypertension Sister   . Hyperlipidemia Sister   . Hypertension Sister   . Diabetes Mother   . Heart failure Mother     died @ 39  . Heart failure Maternal Grandfather   . Other Father     she did not know her father.    Social History Social History  Substance Use Topics  . Smoking status: Former Research scientist (life sciences)  . Smokeless tobacco: Never Used     Comment: Remote- quit ~ late 1980's.  . Alcohol Use: No    Review of Systems Constitutional: Negative for fever. Eyes: No visual changes. ENT: Positive for sore throat; negative for difficulty swallowing, but is feeling of foreign body sensation while swallowing. Respiratory: Denies shortness of breath. Positive for occasional cough Gastrointestinal: No abdominal pain.  No nausea, no vomiting.  No diarrhea.  Musculoskeletal: Negative for generalized body aches. Skin: Negative for rash. Neurological: Negative for headaches, focal weakness or numbness. ____________________________________________   PHYSICAL EXAM:  VITAL SIGNS: ED Triage Vitals   Enc Vitals Group     BP 02/01/16 1847 139/75 mmHg     Pulse Rate 02/01/16 1847 70     Resp 02/01/16 1847 20     Temp 02/01/16 1847 98.9 F (37.2 C)     Temp Source 02/01/16 1847 Oral     SpO2 02/01/16 1847 98 %     Weight 02/01/16 1847 147 lb (66.679 kg)     Height --      Head Cir --      Peak Flow --      Pain Score 02/01/16 1848 8     Pain Loc --      Pain Edu? --      Excl. in  GC? --     Constitutional: Alert and oriented. Well appearing and in no acute distress. Eyes: Conjunctiva are normal, but there is a purulent exudate noted in the lower lid on the left. Nose: No congestion/rhinnorhea. Tenderness noted over the left maxillary sinus. Mouth/Throat: Mucous membranes are moist.  Oropharynx erythematous, no tonsillar exudate. Neck: No stridor.  Lymphatic: Lymphadenopathy: None noted Cardiovascular: Normal rate, regular rhythm. Good peripheral circulation. Respiratory: Normal respiratory effort. Lungs CTAB. Skin:  Skin is warm, dry and intact. No rash noted Psychiatric: Mood and affect are normal. Speech and behavior are normal.  ____________________________________________   LABS (all labs ordered are listed, but only abnormal results are displayed)  Labs Reviewed - No data to display ____________________________________________  EKG   ____________________________________________  RADIOLOGY  Not indicated ____________________________________________   PROCEDURES  Procedure(s) performed: None  Critical Care performed: No  ____________________________________________   INITIAL IMPRESSION / ASSESSMENT AND PLAN / ED COURSE  Pertinent labs & imaging results that were available during my care of the patient were reviewed by me and considered in my medical decision making (see chart for details).  Patient was encouraged to follow up with her primary care provider for symptoms that are not improving with medication. She will be prescribed amoxicillin, Cipro  eyedrops, and lidocaine solution to gargle. She was encouraged to return to the emergency department for symptoms that are not improving over the next 2 days if she is unable to schedule an appointment with her primary care provider. ____________________________________________   FINAL CLINICAL IMPRESSION(S) / ED DIAGNOSES  Final diagnoses:  Acute pharyngitis, unspecified etiology  Acute ethmoidal sinusitis, recurrence not specified      Victorino Dike, FNP 02/01/16 2316  Orbie Pyo, MD 02/01/16 [phone]

## 2016-02-02 ENCOUNTER — Ambulatory Visit: Payer: 59 | Admitting: Family Medicine

## 2016-02-02 DIAGNOSIS — Z0289 Encounter for other administrative examinations: Secondary | ICD-10-CM

## 2016-02-03 ENCOUNTER — Telehealth: Payer: Self-pay | Admitting: Family Medicine

## 2016-02-03 NOTE — Telephone Encounter (Signed)
No follow up needed

## 2016-02-03 NOTE — Telephone Encounter (Signed)
Patient did not come for their scheduled appointment 5/3 for eyes running, [facility] Please let me know if the patient needs to be contacted immediately for follow up or if no follow up is necessary.

## 2016-03-26 ENCOUNTER — Other Ambulatory Visit: Payer: Self-pay | Admitting: Family Medicine

## 2016-03-26 DIAGNOSIS — E785 Hyperlipidemia, unspecified: Secondary | ICD-10-CM

## 2016-03-26 DIAGNOSIS — I10 Essential (primary) hypertension: Secondary | ICD-10-CM

## 2016-03-26 DIAGNOSIS — E1121 Type 2 diabetes mellitus with diabetic nephropathy: Secondary | ICD-10-CM

## 2016-03-26 DIAGNOSIS — N186 End stage renal disease: Secondary | ICD-10-CM

## 2016-03-26 DIAGNOSIS — Z1159 Encounter for screening for other viral diseases: Secondary | ICD-10-CM

## 2016-03-26 DIAGNOSIS — E559 Vitamin D deficiency, unspecified: Secondary | ICD-10-CM

## 2016-03-26 DIAGNOSIS — Z992 Dependence on renal dialysis: Secondary | ICD-10-CM

## 2016-03-28 ENCOUNTER — Other Ambulatory Visit (INDEPENDENT_AMBULATORY_CARE_PROVIDER_SITE_OTHER): Payer: 59

## 2016-03-28 ENCOUNTER — Telehealth: Payer: Self-pay | Admitting: *Deleted

## 2016-03-28 DIAGNOSIS — E785 Hyperlipidemia, unspecified: Secondary | ICD-10-CM

## 2016-03-28 DIAGNOSIS — Z1159 Encounter for screening for other viral diseases: Secondary | ICD-10-CM

## 2016-03-28 DIAGNOSIS — Z992 Dependence on renal dialysis: Secondary | ICD-10-CM

## 2016-03-28 DIAGNOSIS — I10 Essential (primary) hypertension: Secondary | ICD-10-CM

## 2016-03-28 DIAGNOSIS — E559 Vitamin D deficiency, unspecified: Secondary | ICD-10-CM

## 2016-03-28 DIAGNOSIS — E1121 Type 2 diabetes mellitus with diabetic nephropathy: Secondary | ICD-10-CM

## 2016-03-28 DIAGNOSIS — N186 End stage renal disease: Secondary | ICD-10-CM

## 2016-03-28 LAB — RENAL FUNCTION PANEL
ALBUMIN: 4.9 g/dL (ref 3.5–5.2)
BUN: 53 mg/dL — ABNORMAL HIGH (ref 6–23)
CALCIUM: 10.8 mg/dL — AB (ref 8.4–10.5)
CO2: 30 meq/L (ref 19–32)
CREATININE: 7.6 mg/dL — AB (ref 0.40–1.20)
Chloride: 98 mEq/L (ref 96–112)
GFR: 6.92 mL/min — CL (ref >60.00)
GLUCOSE: 83 mg/dL (ref 70–99)
Phosphorus: 5.6 mg/dL — ABNORMAL HIGH (ref 2.3–4.6)
Potassium: 5.6 mEq/L — ABNORMAL HIGH (ref 3.5–5.1)
Sodium: 139 mEq/L (ref 135–145)

## 2016-03-28 LAB — LIPID PANEL
CHOL/HDL RATIO: 2
Cholesterol: 140 mg/dL (ref 0–200)
HDL: 57.4 mg/dL (ref >39.00)
LDL Cholesterol: 65 mg/dL (ref 0–99)
NONHDL: 82.15
Triglycerides: 88 mg/dL (ref 0.0–149.0)
VLDL: 17.6 mg/dL (ref 0.0–40.0)

## 2016-03-28 LAB — CBC WITH DIFFERENTIAL/PLATELET
BASOS ABS: 0 10*3/uL (ref 0.0–0.1)
Basophils Relative: 0.4 % (ref 0.0–3.0)
EOS ABS: 0.3 10*3/uL (ref 0.0–0.7)
Eosinophils Relative: 3.3 % (ref 0.0–5.0)
HEMATOCRIT: 43.7 % (ref 36.0–46.0)
Hemoglobin: 13.8 g/dL (ref 12.0–15.0)
LYMPHS PCT: 21.8 % (ref 12.0–46.0)
Lymphs Abs: 1.8 10*3/uL (ref 0.7–4.0)
MCHC: 31.6 g/dL (ref 30.0–36.0)
MCV: 82.1 fl (ref 78.0–100.0)
Monocytes Absolute: 0.6 10*3/uL (ref 0.1–1.0)
Monocytes Relative: 7.5 % (ref 3.0–12.0)
NEUTROS ABS: 5.5 10*3/uL (ref 1.4–7.7)
NEUTROS PCT: 67 % (ref 43.0–77.0)
PLATELETS: 181 10*3/uL (ref 150.0–400.0)
RBC: 5.33 Mil/uL — ABNORMAL HIGH (ref 3.87–5.11)
RDW: 16.5 % — ABNORMAL HIGH (ref 11.5–15.5)
WBC: 8.2 10*3/uL (ref 4.0–10.5)

## 2016-03-28 LAB — FERRITIN: FERRITIN: 423 ng/mL — AB (ref 10.0–291.0)

## 2016-03-28 LAB — HEMOGLOBIN A1C: Hgb A1c MFr Bld: 5.7 % (ref 4.6–6.5)

## 2016-03-28 LAB — VITAMIN D 25 HYDROXY (VIT D DEFICIENCY, FRACTURES): VITD: 64.13 ng/mL (ref 30.00–100.00)

## 2016-03-28 NOTE — Telephone Encounter (Signed)
Noted. Thanks.

## 2016-03-28 NOTE — Telephone Encounter (Signed)
Hope with [facility] called to let Dr. Darnell Level know pt had critical labs. Pt's Creatinine was 7.60 and her GFR is 6.92, they are aware her labs are usually high but they have to call each time with critical labs

## 2016-03-29 LAB — PARATHYROID HORMONE, INTACT (NO CA): PTH: 391 pg/mL — ABNORMAL HIGH (ref 14–64)

## 2016-03-29 LAB — HEPATITIS C ANTIBODY: HCV AB: NEGATIVE

## 2016-03-30 LAB — HM MAMMOGRAPHY

## 2016-04-03 ENCOUNTER — Encounter: Payer: Self-pay | Admitting: Family Medicine

## 2016-04-03 ENCOUNTER — Ambulatory Visit (INDEPENDENT_AMBULATORY_CARE_PROVIDER_SITE_OTHER): Payer: 59 | Admitting: Family Medicine

## 2016-04-03 VITALS — BP 146/78 | HR 60 | Temp 98.8°F | Ht 63.0 in | Wt 146.0 lb

## 2016-04-03 DIAGNOSIS — Z0000 Encounter for general adult medical examination without abnormal findings: Secondary | ICD-10-CM

## 2016-04-03 DIAGNOSIS — E785 Hyperlipidemia, unspecified: Secondary | ICD-10-CM

## 2016-04-03 DIAGNOSIS — E1121 Type 2 diabetes mellitus with diabetic nephropathy: Secondary | ICD-10-CM

## 2016-04-03 DIAGNOSIS — N186 End stage renal disease: Secondary | ICD-10-CM

## 2016-04-03 DIAGNOSIS — I10 Essential (primary) hypertension: Secondary | ICD-10-CM

## 2016-04-03 DIAGNOSIS — Z992 Dependence on renal dialysis: Secondary | ICD-10-CM

## 2016-04-03 NOTE — Assessment & Plan Note (Signed)
Appreciate renal care of patient. On HD MWF at [facility] in [place]. On top of list for renal transplant.

## 2016-04-03 NOTE — Progress Notes (Signed)
Pre visit review using our clinic review tool, if applicable. No additional management support is needed unless otherwise documented below in the visit note. 

## 2016-04-03 NOTE — Assessment & Plan Note (Signed)
Chronic, stable. Continue pravastatin. 

## 2016-04-03 NOTE — Assessment & Plan Note (Signed)
Chronic, controlled. Latest A1c 5.7% - consider fructosamine next visit. Not on any diabetic meds.

## 2016-04-03 NOTE — Progress Notes (Signed)
BP 146/78 mmHg  Pulse 60  Temp(Src) 98.8 F (37.1 C) (Oral)  Ht '5\' 3"'  (1.6 m)  Wt 146 lb (66.225 kg)  BMI 25.87 kg/m2  LMP  (LMP Unknown)   CC: CPE  Subjective:    Patient ID: Kristen Kirk, female    DOB: 1950-10-14, 65 y.o.   MRN: [id number]  HPI: Kristen Kirk is a 65 y.o. female presenting on 04/03/2016 for Annual Exam   Dialysis at [facility] HD in [place] MWF 4 hour sessions. 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 At top of list for kidney transplant.   Preventative: Colonoscopy - normal 2010 Olevia Perches).rpt 10 yrs.  Pap normal 07/2011, 08/2014.rpt 2018.  Mammogram 03/2016 [facility] 1 Lung cancer screen - quit smoking ~1987 Flu shot yearly.  Tetanus 2009.  Pneumovax 2013  zostavax - declines for now.  Seat belt use discussed.  Sunscreen use discussed. No changing moles on skin.   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 Interim medical history since our last visit reviewed. Allergies and medications reviewed and updated. Current Outpatient Prescriptions on File Prior to Visit  Medication Sig  . amLODipine (NORVASC) 10 MG tablet Take 1 tablet (10 mg total) by mouth daily.  . Ascorbic Acid (VITAMIN C) 100 MG tablet Take 100 mg by mouth daily.    Marland Kitchen aspirin 81 MG tablet Take 81 mg by mouth daily.  . Blood Glucose Monitoring Suppl (ONE TOUCH ULTRA SYSTEM KIT) W/DEVICE KIT 1 kit by Does not apply route once.  . carvedilol (COREG) 12.5 MG tablet Take 1 tablet (12.5 mg total) by mouth 2 (two) times daily with a meal.  . Cetirizine HCl 10 MG CAPS Take 1 capsule (10 mg total) by mouth daily.  . Cholecalciferol (VITAMIN D3) 1000 UNITS CAPS Take 1 capsule by mouth 2 (two) times daily.    .  cyclobenzaprine (FLEXERIL) 5 MG tablet Take 1-2 tablets (5-10 mg total) by mouth 2 (two) times daily as needed for muscle spasms.  . diclofenac sodium (VOLTAREN) 1 % GEL Apply 1 application topically 3 (three) times daily.  . dorzolamide-timolol (COSOPT) 22.3-6.8 MG/ML ophthalmic solution Place 1 drop into both eyes 3 (three) times daily.    Marland Kitchen esomeprazole (NEXIUM) 40 MG capsule Take 1 capsule (40 mg total) by mouth daily.  . furosemide (LASIX) 40 MG tablet Take 1 tablet (40 mg total) by mouth 2 (two) times daily.  Marland Kitchen glucose blood test strip Check sugars once daily  . hydrOXYzine (ATARAX/VISTARIL) 25 MG tablet Take 1 tablet (25 mg total) by mouth 2 (two) times daily as needed.  . lidocaine (XYLOCAINE) 2 % solution Use as directed 20 mLs in the mouth or throat as needed (gargle at least 15 seconds then spit).  . meclizine (ANTIVERT) 25 MG tablet Take 1 tablet (25 mg total) by mouth 3 (three) times daily as needed.  . Multiple Vitamin (MULTIVITAMIN) tablet Take 1 tablet by mouth daily.    . polyethylene glycol (MIRALAX / GLYCOLAX) packet Take 17 g by mouth daily as needed for moderate constipation.  . pravastatin (PRAVACHOL) 80 MG tablet Take 1 tablet by mouth  every evening  . sevelamer carbonate (RENVELA) 800 MG tablet Take 800 mg by mouth 3 (three) times daily with meals.  Marland Kitchen  sucralfate (CARAFATE) 1 g tablet Take 1 tablet (1 g total) by mouth daily.  . travoprost, benzalkonium, (TRAVATAN) 0.004 % ophthalmic solution Place 1 drop into both eyes at bedtime.    . traZODone (DESYREL) 50 MG tablet Take 50 mg by mouth at bedtime as needed.   . vitamin E 100 UNIT capsule Take 400 Units by mouth daily.   Marland Kitchen lisinopril (PRINIVIL,ZESTRIL) 10 MG tablet Take 5 mg by mouth daily. Reported on 04/03/2016  . sodium bicarbonate 650 MG tablet Take 1 tablet (650 mg total) by mouth 3 (three) times daily. (Patient not taking: Reported on 04/03/2016)   No current facility-administered medications on file prior to visit.     Review of Systems  Constitutional: Negative for fever, chills, activity change, appetite change, fatigue and unexpected weight change.  HENT: Negative for hearing loss.   Eyes: Negative for visual disturbance.  Respiratory: Negative for cough, chest tightness, shortness of breath and wheezing.   Cardiovascular: Negative for chest pain, palpitations and leg swelling.  Gastrointestinal: Negative for nausea, vomiting, abdominal pain, diarrhea, constipation, blood in stool and abdominal distention.  Genitourinary: Negative for hematuria and difficulty urinating.  Musculoskeletal: Negative for myalgias, arthralgias and neck pain.  Skin: Negative for rash.  Neurological: Negative for dizziness, seizures, syncope and headaches.  Hematological: Negative for adenopathy. Does not bruise/bleed easily.  Psychiatric/Behavioral: Negative for dysphoric mood. The patient is not nervous/anxious.    Per HPI unless specifically indicated in ROS section     Objective:    BP 146/78 mmHg  Pulse 60  Temp(Src) 98.8 F (37.1 C) (Oral)  Ht '5\' 3"'  (1.6 m)  Wt 146 lb (66.225 kg)  BMI 25.87 kg/m2  LMP  (LMP Unknown)  Wt Readings from Last 3 Encounters:  04/03/16 146 lb (66.225 kg)  02/01/16 147 lb (66.679 kg)  12/31/15 147 lb 12 oz (67.019 kg)    Physical Exam  Constitutional: She is oriented to person, place, and time. She appears well-developed and well-nourished. No distress.  HENT:  Head: Normocephalic and atraumatic.  Right Ear: Hearing, tympanic membrane, external ear and ear canal normal.  Left Ear: Hearing, tympanic membrane, external ear and ear canal normal.  Nose: Nose normal.  Mouth/Throat: Uvula is midline, oropharynx is clear and moist and mucous membranes are normal. No oropharyngeal exudate, posterior oropharyngeal edema or posterior oropharyngeal erythema.  Eyes: Conjunctivae and EOM are normal. Pupils are equal, round, and reactive to light. No scleral icterus.  Neck: Normal range  of motion. Neck supple. Carotid bruit is not present. No thyromegaly present.  Cardiovascular: Normal rate, regular rhythm, normal heart sounds and intact distal pulses.   No murmur heard. Pulses:      Radial pulses are 2+ on the right side, and 2+ on the left side.  Pulmonary/Chest: Effort normal and breath sounds normal. No respiratory distress. She has no wheezes. She has no rales.  Abdominal: Soft. Bowel sounds are normal. She exhibits no distension and no mass. There is no tenderness. There is no rebound and no guarding.  Musculoskeletal: Normal range of motion. She exhibits no edema.  LUE fistula  Lymphadenopathy:    She has no cervical adenopathy.  Neurological: She is alert and oriented to person, place, and time.  CN grossly intact, station and gait intact  Skin: Skin is warm and dry. No rash noted.  Psychiatric: She has a normal mood and affect. Her behavior is normal. Judgment and thought content normal.  Nursing note and vitals reviewed.  Results for  orders placed or performed in visit on 04/03/16  HM MAMMOGRAPHY  Result Value Ref Range   HM Mammogram 0-4 Bi-Rad 0-4 Bi-Rad, Self Reported Normal      Assessment & Plan:   Problem List Items Addressed This Visit    ESRD (end stage renal disease) on dialysis [facility])    Appreciate renal care of patient. On HD MWF at [facility] in [place]. On top of list for renal transplant.      Controlled type 2 diabetes mellitus with diabetic nephropathy (HCC)    Chronic, controlled. Latest A1c 5.7% - consider fructosamine next visit. Not on any diabetic meds.       HLD (hyperlipidemia)    Chronic, stable. Continue pravastatin.      HTN (hypertension)    Chronic, stable. Lisinopril on hold 2/2 recent hyperkalemia.  Planned lower goal for dry weight.       Healthcare maintenance - Primary    Preventative protocols reviewed and updated unless pt declined. Discussed healthy diet and lifestyle.           Follow up  plan: Return in about 6 months (around 10/04/2016), or as needed, for follow up visit.  Ria Bush, MD

## 2016-04-03 NOTE — Patient Instructions (Addendum)
Continue to hold lisinopril until next lab work at dialysis. Then will recheck potassium levels at dialysis.  You are doing well today.  Return as needed or in 6 months for follow up visit.   Health Maintenance, Female Adopting a healthy lifestyle and getting preventive care can go a long way to promote health and wellness. Talk with your health care provider about what schedule of regular examinations is right for you. This is a good chance for you to check in with your provider about disease prevention and staying healthy. In between checkups, there are plenty of things you can do on your own. Experts have done a lot of research about which lifestyle changes and preventive measures are most likely to keep you healthy. Ask your health care provider for more information. WEIGHT AND DIET  Eat a healthy diet  Be sure to include plenty of vegetables, fruits, low-fat dairy products, and lean protein.  Do not eat a lot of foods high in solid fats, added sugars, or salt.  Get regular exercise. This is one of the most important things you can do for your health.  Most adults should exercise for at least 150 minutes each week. The exercise should increase your heart rate and make you sweat (moderate-intensity exercise).  Most adults should also do strengthening exercises at least twice a week. This is in addition to the moderate-intensity exercise.  Maintain a healthy weight  Body mass index (BMI) is a measurement that can be used to identify possible weight problems. It estimates body fat based on height and weight. Your health care provider can help determine your BMI and help you achieve or maintain a healthy weight.  For females 94 years of age and older:   A BMI below 18.5 is considered underweight.  A BMI of 18.5 to 24.9 is normal.  A BMI of 25 to 29.9 is considered overweight.  A BMI of 30 and above is considered obese.  Watch levels of cholesterol and blood lipids  You should  start having your blood tested for lipids and cholesterol at 64 years of age, then have this test every 5 years.  You may need to have your cholesterol levels checked more often if:  Your lipid or cholesterol levels are high.  You are older than 64 years of age.  You are at high risk for heart disease.  CANCER SCREENING   Lung Cancer  Lung cancer screening is recommended for adults 25-65 years old who are at high risk for lung cancer because of a history of smoking.  A yearly low-dose CT scan of the lungs is recommended for people who:  Currently smoke.  Have quit within the past 15 years.  Have at least a 30-pack-year history of smoking. A pack year is smoking an average of one pack of cigarettes a day for 1 year.  Yearly screening should continue until it has been 15 years since you quit.  Yearly screening should stop if you develop a health problem that would prevent you from having lung cancer treatment.  Breast Cancer  Practice breast self-awareness. This means understanding how your breasts normally appear and feel.  It also means doing regular breast self-exams. Let your health care provider know about any changes, no matter how small.  If you are in your 20s or 30s, you should have a clinical breast exam (CBE) by a health care provider every 1-3 years as part of a regular health exam.  If you are 40 or  older, have a CBE every year. Also consider having a breast X-ray (mammogram) every year.  If you have a family history of breast cancer, talk to your health care provider about genetic screening.  If you are at high risk for breast cancer, talk to your health care provider about having an MRI and a mammogram every year.  Breast cancer gene (BRCA) assessment is recommended for women who have family members with BRCA-related cancers. BRCA-related cancers include:  Breast.  Ovarian.  Tubal.  Peritoneal cancers.  Results of the assessment will determine the  need for genetic counseling and BRCA1 and BRCA2 testing. Cervical Cancer Your health care provider may recommend that you be screened regularly for cancer of the pelvic organs (ovaries, uterus, and vagina). This screening involves a pelvic examination, including checking for microscopic changes to the surface of your cervix (Pap test). You may be encouraged to have this screening done every 3 years, beginning at age 79.  For women ages 9-65, health care providers may recommend pelvic exams and Pap testing every 3 years, or they may recommend the Pap and pelvic exam, combined with testing for human papilloma virus (HPV), every 5 years. Some types of HPV increase your risk of cervical cancer. Testing for HPV may also be done on women of any age with unclear Pap test results.  Other health care providers may not recommend any screening for nonpregnant women who are considered low risk for pelvic cancer and who do not have symptoms. Ask your health care provider if a screening pelvic exam is right for you.  If you have had past treatment for cervical cancer or a condition that could lead to cancer, you need Pap tests and screening for cancer for at least 20 years after your treatment. If Pap tests have been discontinued, your risk factors (such as having a new sexual partner) need to be reassessed to determine if screening should resume. Some women have medical problems that increase the chance of getting cervical cancer. In these cases, your health care provider may recommend more frequent screening and Pap tests. Colorectal Cancer  This type of cancer can be detected and often prevented.  Routine colorectal cancer screening usually begins at 64 years of age and continues through 64 years of age.  Your health care provider may recommend screening at an earlier age if you have risk factors for colon cancer.  Your health care provider may also recommend using home test kits to check for hidden blood in  the stool.  A small camera at the end of a tube can be used to examine your colon directly (sigmoidoscopy or colonoscopy). This is done to check for the earliest forms of colorectal cancer.  Routine screening usually begins at age 12.  Direct examination of the colon should be repeated every 5-10 years through 64 years of age. However, you may need to be screened more often if early forms of precancerous polyps or small growths are found. Skin Cancer  Check your skin from head to toe regularly.  Tell your health care provider about any new moles or changes in moles, especially if there is a change in a mole's shape or color.  Also tell your health care provider if you have a mole that is larger than the size of a pencil eraser.  Always use sunscreen. Apply sunscreen liberally and repeatedly throughout the day.  Protect yourself by wearing long sleeves, pants, a wide-brimmed hat, and sunglasses whenever you are outside. HEART DISEASE, DIABETES,  AND HIGH BLOOD PRESSURE   High blood pressure causes heart disease and increases the risk of stroke. High blood pressure is more likely to develop in:  People who have blood pressure in the high end of the normal range (130-139/85-89 mm Hg).  People who are overweight or obese.  People who are African American.  If you are 18-39 years of age, have your blood pressure checked every 3-5 years. If you are 40 years of age or older, have your blood pressure checked every year. You should have your blood pressure measured twice--once when you are at a hospital or clinic, and once when you are not at a hospital or clinic. Record the average of the two measurements. To check your blood pressure when you are not at a hospital or clinic, you can use:  An automated blood pressure machine at a pharmacy.  A home blood pressure monitor.  If you are between 55 years and 79 years old, ask your health care provider if you should take aspirin to prevent  strokes.  Have regular diabetes screenings. This involves taking a blood sample to check your fasting blood sugar level.  If you are at a normal weight and have a low risk for diabetes, have this test once every three years after 64 years of age.  If you are overweight and have a high risk for diabetes, consider being tested at a younger age or more often. PREVENTING INFECTION  Hepatitis B  If you have a higher risk for hepatitis B, you should be screened for this virus. You are considered at high risk for hepatitis B if:  You were born in a country where hepatitis B is common. Ask your health care provider which countries are considered high risk.  Your parents were born in a high-risk country, and you have not been immunized against hepatitis B (hepatitis B vaccine).  You have HIV or AIDS.  You use needles to inject street drugs.  You live with someone who has hepatitis B.  You have had sex with someone who has hepatitis B.  You get hemodialysis treatment.  You take certain medicines for conditions, including cancer, organ transplantation, and autoimmune conditions. Hepatitis C  Blood testing is recommended for:  Everyone born from 1945 through 1965.  Anyone with known risk factors for hepatitis C. Sexually transmitted infections (STIs)  You should be screened for sexually transmitted infections (STIs) including gonorrhea and chlamydia if:  You are sexually active and are younger than 64 years of age.  You are older than 64 years of age and your health care provider tells you that you are at risk for this type of infection.  Your sexual activity has changed since you were last screened and you are at an increased risk for chlamydia or gonorrhea. Ask your health care provider if you are at risk.  If you do not have HIV, but are at risk, it may be recommended that you take a prescription medicine daily to prevent HIV infection. This is called pre-exposure prophylaxis  (PrEP). You are considered at risk if:  You are sexually active and do not regularly use condoms or know the HIV status of your partner(s).  You take drugs by injection.  You are sexually active with a partner who has HIV. Talk with your health care provider about whether you are at high risk of being infected with HIV. If you choose to begin PrEP, you should first be tested for HIV. You should then be   tested every 3 months for as long as you are taking PrEP.  PREGNANCY   If you are premenopausal and you may become pregnant, ask your health care provider about preconception counseling.  If you may become pregnant, take 400 to 800 micrograms (mcg) of folic acid every day.  If you want to prevent pregnancy, talk to your health care provider about birth control (contraception). OSTEOPOROSIS AND MENOPAUSE   Osteoporosis is a disease in which the bones lose minerals and strength with aging. This can result in serious bone fractures. Your risk for osteoporosis can be identified using a bone density scan.  If you are 43 years of age or older, or if you are at risk for osteoporosis and fractures, ask your health care provider if you should be screened.  Ask your health care provider whether you should take a calcium or vitamin D supplement to lower your risk for osteoporosis.  Menopause may have certain physical symptoms and risks.  Hormone replacement therapy may reduce some of these symptoms and risks. Talk to your health care provider about whether hormone replacement therapy is right for you.  HOME CARE INSTRUCTIONS   Schedule regular health, dental, and eye exams.  Stay current with your immunizations.   Do not use any tobacco products including cigarettes, chewing tobacco, or electronic cigarettes.  If you are pregnant, do not drink alcohol.  If you are breastfeeding, limit how much and how often you drink alcohol.  Limit alcohol intake to no more than 1 drink per day for  nonpregnant women. One drink equals 12 ounces of beer, 5 ounces of wine, or 1 ounces of hard liquor.  Do not use street drugs.  Do not share needles.  Ask your health care provider for help if you need support or information about quitting drugs.  Tell your health care provider if you often feel depressed.  Tell your health care provider if you have ever been abused or do not feel safe at home.   This information is not intended to replace advice given to you by your health care provider. Make sure you discuss any questions you have with your health care provider.   Document Released: 04/03/2011 Document Revised: 10/09/2014 Document Reviewed: 08/20/2013 Elsevier Interactive Patient Education [employer].

## 2016-04-03 NOTE — Assessment & Plan Note (Signed)
Chronic, stable. Lisinopril on hold 2/2 recent hyperkalemia.  Planned lower goal for dry weight.

## 2016-04-03 NOTE — Assessment & Plan Note (Signed)
Preventative protocols reviewed and updated unless pt declined. Discussed healthy diet and lifestyle.  

## 2016-05-19 ENCOUNTER — Other Ambulatory Visit: Payer: Self-pay | Admitting: Family Medicine

## 2016-08-12 ENCOUNTER — Other Ambulatory Visit: Payer: Self-pay | Admitting: Family Medicine

## 2016-09-18 ENCOUNTER — Other Ambulatory Visit: Payer: Self-pay | Admitting: Family Medicine

## 2016-10-05 ENCOUNTER — Ambulatory Visit (INDEPENDENT_AMBULATORY_CARE_PROVIDER_SITE_OTHER): Payer: 59 | Admitting: Family Medicine

## 2016-10-05 DIAGNOSIS — Z0289 Encounter for other administrative examinations: Secondary | ICD-10-CM

## 2016-11-02 ENCOUNTER — Other Ambulatory Visit: Payer: Self-pay | Admitting: Family Medicine

## 2016-11-03 NOTE — Telephone Encounter (Signed)
Received refill electronically Patient no showed for last office visit Okay to refill?

## 2016-11-05 ENCOUNTER — Other Ambulatory Visit: Payer: Self-pay | Admitting: Family Medicine

## 2017-01-11 ENCOUNTER — Ambulatory Visit (INDEPENDENT_AMBULATORY_CARE_PROVIDER_SITE_OTHER): Payer: 59 | Admitting: Family Medicine

## 2017-01-11 ENCOUNTER — Encounter: Payer: Self-pay | Admitting: Family Medicine

## 2017-01-11 VITALS — BP 146/78 | HR 72 | Temp 98.6°F | Wt 144.2 lb

## 2017-01-11 DIAGNOSIS — K5900 Constipation, unspecified: Secondary | ICD-10-CM | POA: Diagnosis not present

## 2017-01-11 DIAGNOSIS — K625 Hemorrhage of anus and rectum: Secondary | ICD-10-CM | POA: Insufficient documentation

## 2017-01-11 DIAGNOSIS — R159 Full incontinence of feces: Secondary | ICD-10-CM

## 2017-01-11 NOTE — Patient Instructions (Addendum)
I think you have anal fissure from hard stools. Continue desitin. May try cream sent to pharmacy - caution with low blood pressure/dizziness.  Dink plenty of water and good fiber in the diet, start miralax 1 spoonful daily for goal 1 soft a day.  Let us know if not improving or ongoing bleeding for GI referrral.   Anal Fissure, Adult An anal fissure is a small tear or crack in the skin around the anus. Bleeding from a fissure usually stops on its own within a few minutes. However, bleeding will often occur again with each bowel movement until the crack heals. What are the causes? This condition may be caused by:  Passing large, hard stool (feces).  Frequent diarrhea.  Constipation.  Inflammatory bowel disease (Crohn disease or ulcerative colitis).  Infections.  Anal sex. What are the signs or symptoms? Symptoms of this condition include:  Bleeding from the rectum.  Small amounts of blood seen on your stool, on toilet paper, or in the toilet after a bowel movement.  Painful bowel movements.  Itching or irritation around the anus. How is this diagnosed? A health care provider may diagnose this condition by closely examining the anal area. An anal fissure can usually be seen with careful inspection. In some cases, a rectal exam may be performed, or a short tube (anoscope) may be used to examine the anal canal. How is this treated? Treatment for this condition may include:  Taking steps to avoid constipation. This may include making changes to your diet, such as increasing your intake of fiber or fluid.  Taking fiber supplements. These supplements can soften your stool to help make bowel movements easier. Your health care provider may also prescribe a stool softener if your stool is often hard.  Taking sitz baths. This may help to heal the tear.  Using medicated creams or ointments. These may be prescribed to lessen discomfort. Follow these instructions at home: Eating and  drinking   Avoid foods that may be constipating, such as bananas and dairy products.  Drink enough fluid to keep your urine clear or pale yellow.  Maintain a diet that is high in fruits, whole grains, and vegetables. General instructions   Keep the anal area as clean and dry as possible.  Take sitz baths as told by your health care provider. Do not use soap in the sitz baths.  Take over-the-counter and prescription medicines only as told by your health care provider.  Use creams or ointments only as told by your health care provider.  Keep all follow-up visits as told by your health care provider. This is important. Contact a health care provider if:  You have more bleeding.  You have a fever.  You have diarrhea that is mixed with blood.  You continue to have pain.  Your problem is getting worse rather than better. This information is not intended to replace advice given to you by your health care provider. Make sure you discuss any questions you have with your health care provider. Document Released: 09/18/2005 Document Revised: 01/26/2016 Document Reviewed: 12/14/2014 Elsevier Interactive Patient Education  2017 [employer].

## 2017-01-11 NOTE — Assessment & Plan Note (Signed)
Previously evaluated by GI Olevia Perches 2012) - thought related to constipation and recommended daily small dose miralax to combat. Unclear if pt ever tried this. Low threshold to refer back to GI.

## 2017-01-11 NOTE — Assessment & Plan Note (Signed)
Tender exam today points to anal fissure vs internal hemorrhoids. No ext hemorrhoids present.  Treat with supportive care of sitz baths, starting miralax, increased fiber and water in diet. With Rx compounded nifedipine 0.2% gel to use around rectum. Written Rx provided to patient to take to [facility] and she will check on affordability. If expensive, consider topical nitroglycerin.  If ongoing, we will refer back to GI for further evaluation - colonoscopy will be due later this year. Pt agrees with plan.

## 2017-01-11 NOTE — Progress Notes (Signed)
Pre visit review using our clinic review tool, if applicable. No additional management support is needed unless otherwise documented below in the visit note. 

## 2017-01-11 NOTE — Progress Notes (Signed)
BP (!) 146/78   Pulse 72   Temp 98.6 F (37 C) (Oral)   Wt 144 lb 4 oz (65.4 kg)   LMP  (LMP Unknown)   BMI 25.55 kg/m    CC: blood in stool Subjective:    Patient ID: Kristen Kirk, female    DOB: Oct 14, 1950, 66 y.o.   MRN: [id number]  HPI: Kristen Kirk is a 66 y.o. female presenting on 01/11/2017 for Blood In Stools (BRB; rectal irritation)   1 wk h/o blood with wiping after bowel movement. She has been slightly more constipated. Last night noticed perirectal pain, irritation, and increased bright red blood on TP with wiping - left work early. She also notices blood on pad (uses for some urinary incontinence). No significant blood in commode or mixed in stool. Denies abd pain, nausea/vomiting, fevers/chills. Some pain with defecation. Some fecal incontinence and trouble controlling gas.   vaseline did not help. desitin has significantly helped. She also used sitz bath.  Stools alternate between hard and soft.  She worries renvela may be causing constipation. Not currently using miralax.   ESRD on dialysis at [facility] in [place] MWF. Carvedilol recently increased to 90m bid.  [facility]   Colonoscopy - normal 2010 (Olevia Perches.rpt 10 yrs.   Relevant past medical, surgical, family and social history reviewed and updated as indicated. Interim medical history since our last visit reviewed. Allergies and medications reviewed and updated. Outpatient Medications Prior to Visit  Medication Sig Dispense Refill  . amLODipine (NORVASC) 10 MG tablet TAKE 1 TABLET BY MOUTH  DAILY 90 tablet 0  . Ascorbic Acid (VITAMIN C) 100 MG tablet Take 100 mg by mouth daily.      .Marland Kitchenaspirin 81 MG tablet Take 81 mg by mouth daily.    . Blood Glucose Monitoring Suppl (ONE TOUCH ULTRA SYSTEM KIT) W/DEVICE KIT 1 kit by Does not apply route once. 1 each 0  . Cetirizine HCl 10 MG CAPS Take 1 capsule (10 mg total) by mouth daily. 30 capsule 3  .  Cholecalciferol (VITAMIN D3) 1000 UNITS CAPS Take 1 capsule by mouth 2 (two) times daily.      . cyclobenzaprine (FLEXERIL) 5 MG tablet Take 1-2 tablets (5-10 mg total) by mouth 2 (two) times daily as needed for muscle spasms. 40 tablet 0  . diclofenac sodium (VOLTAREN) 1 % GEL Apply 1 application topically 3 (three) times daily. 1 Tube 1  . dorzolamide-timolol (COSOPT) 22.3-6.8 MG/ML ophthalmic solution Place 1 drop into both eyes 3 (three) times daily.      .Marland Kitchenesomeprazole (NEXIUM) 40 MG capsule Take 1 capsule (40 mg total) by mouth daily. 30 capsule 3  . furosemide (LASIX) 40 MG tablet TAKE 1 TABLET BY MOUTH TWO  TIMES DAILY 180 tablet 2  . glucose blood test strip Check sugars once daily 100 each 11  . hydrOXYzine (ATARAX/VISTARIL) 25 MG tablet Take 1 tablet (25 mg total) by mouth 2 (two) times daily as needed.    . lidocaine (XYLOCAINE) 2 % solution Use as directed 20 mLs in the mouth or throat as needed (gargle at least 15 seconds then spit). 100 mL 0  . meclizine (ANTIVERT) 25 MG tablet Take 1 tablet (25 mg total) by mouth 3 (three) times daily as needed. 30 tablet 1  . Multiple Vitamin (MULTIVITAMIN) tablet Take 1 tablet by mouth daily.      . polyethylene glycol (MIRALAX / GLYCOLAX) packet Take  17 g by mouth daily as needed for moderate constipation. 14 each 0  . pravastatin (PRAVACHOL) 80 MG tablet TAKE 1 TABLET BY MOUTH  EVERY EVENING 90 tablet 1  . sevelamer carbonate (RENVELA) 800 MG tablet Take 800 mg by mouth 3 (three) times daily with meals.    . sodium bicarbonate 650 MG tablet Take 1 tablet (650 mg total) by mouth 3 (three) times daily.    . sucralfate (CARAFATE) 1 g tablet Take 1 tablet (1 g total) by mouth daily. 30 tablet 0  . travoprost, benzalkonium, (TRAVATAN) 0.004 % ophthalmic solution Place 1 drop into both eyes at bedtime.      . traZODone (DESYREL) 50 MG tablet Take 50 mg by mouth at bedtime as needed.     . vitamin E 100 UNIT capsule Take 400 Units by mouth daily.       . carvedilol (COREG) 12.5 MG tablet Take 1 tablet (12.5 mg total) by mouth 2 (two) times daily with a meal. 60 tablet 0   No facility-administered medications prior to visit.      Per HPI unless specifically indicated in ROS section below Review of Systems     Objective:    BP (!) 146/78   Pulse 72   Temp 98.6 F (37 C) (Oral)   Wt 144 lb 4 oz (65.4 kg)   LMP  (LMP Unknown)   BMI 25.55 kg/m   Wt Readings from Last 3 Encounters:  01/11/17 144 lb 4 oz (65.4 kg)  04/03/16 146 lb (66.2 kg)  02/01/16 147 lb (66.7 kg)    Physical Exam  Constitutional: She appears well-developed and well-nourished. No distress.  Abdominal: Soft. Normal appearance and bowel sounds are normal. She exhibits no distension and no mass. There is no hepatosplenomegaly. There is no tenderness. There is no rigidity, no rebound, no guarding, no CVA tenderness and negative Murphy's sign.  Genitourinary: Rectal exam shows fissure. Rectal exam shows no external hemorrhoid.  Genitourinary Comments: No obvious bleeding Tender exam Internal exam not performed  Musculoskeletal: She exhibits no edema.  Psychiatric: She has a normal mood and affect.  Nursing note and vitals reviewed.     Assessment & Plan:   Problem List Items Addressed This Visit    Fecal incontinence alternating with constipation    Previously evaluated by GI Olevia Perches 2012) - thought related to constipation and recommended daily small dose miralax to combat. Unclear if pt ever tried this. Low threshold to refer back to GI.       Rectal bleeding - Primary    Tender exam today points to anal fissure vs internal hemorrhoids. No ext hemorrhoids present.  Treat with supportive care of sitz baths, starting miralax, increased fiber and water in diet. With Rx compounded nifedipine 0.2% gel to use around rectum. Written Rx provided to patient to take to [facility] and she will check on affordability. If expensive, consider topical nitroglycerin.  If  ongoing, we will refer back to GI for further evaluation - colonoscopy will be due later this year. Pt agrees with plan.          Follow up plan: Return if symptoms worsen or fail to improve.  Ria Bush, MD

## 2017-01-25 ENCOUNTER — Other Ambulatory Visit: Payer: Self-pay | Admitting: Family Medicine

## 2017-03-12 ENCOUNTER — Encounter: Payer: Self-pay | Admitting: Family Medicine

## 2017-03-12 ENCOUNTER — Telehealth: Payer: Self-pay | Admitting: Family Medicine

## 2017-03-12 ENCOUNTER — Ambulatory Visit (INDEPENDENT_AMBULATORY_CARE_PROVIDER_SITE_OTHER): Payer: 59 | Admitting: Family Medicine

## 2017-03-12 VITALS — BP 166/84 | HR 63 | Temp 98.2°F | Ht 63.0 in | Wt 141.2 lb

## 2017-03-12 DIAGNOSIS — Z992 Dependence on renal dialysis: Secondary | ICD-10-CM | POA: Diagnosis not present

## 2017-03-12 DIAGNOSIS — R42 Dizziness and giddiness: Secondary | ICD-10-CM | POA: Diagnosis not present

## 2017-03-12 DIAGNOSIS — N186 End stage renal disease: Secondary | ICD-10-CM | POA: Diagnosis not present

## 2017-03-12 DIAGNOSIS — I10 Essential (primary) hypertension: Secondary | ICD-10-CM

## 2017-03-12 MED ORDER — HYDRALAZINE HCL 25 MG PO TABS: 25.0000 mg | ORAL_TABLET | Freq: Two times a day (BID) | ORAL | 1 refills | Status: DC

## 2017-03-12 NOTE — Assessment & Plan Note (Deleted)
Dix hallpike positive on right. Somewhat prolonged duration for BPPV but anticipate this is cause. Provided with home modified epley exercises.

## 2017-03-12 NOTE — Telephone Encounter (Addendum)
Pt has not gone to UC yet; pt said she would take appt with Dr Darnell Level today at 4:15. Morey Hummingbird to add on.FYI to Dr Darnell Level.

## 2017-03-12 NOTE — Telephone Encounter (Signed)
See my other note - can we get her in at 4:15?

## 2017-03-12 NOTE — Telephone Encounter (Signed)
I spoke with pt and she took BP 15 min ago BP 197/76 pt has H/A and dizziness. Pt thinks she will have to pay money up front at ED but pt will go to UC. FYI to Dr Darnell Level.

## 2017-03-12 NOTE — Patient Instructions (Addendum)
For blood pressure -continue current regimen, add hydralazine 25mg  twice daily - dose after dialysis on dialysis days.  For right sided vertigo - try repositioning maneuvers provided today. Touch base with your kidney doctor - make separate appointment from dialysis if able.  Seek urgent care if any signs of stroke (one sided weakness, slurred speech, trouble smiling with facial droop), nausea/vomiting, severe headache.

## 2017-03-12 NOTE — Telephone Encounter (Signed)
[facility] called - pt had ED disposition and pt refused to go. For High Blood Pressure, 197/79 and unsteady on feet  Th will send report

## 2017-03-12 NOTE — Progress Notes (Signed)
BP (!) 166/84   Pulse 63   Temp 98.2 F (36.8 C) (Oral)   Ht 5' 3" (1.6 m)   Wt 141 lb 4 oz (64.1 kg)   LMP  (LMP Unknown)   SpO2 98%   BMI 25.02 kg/m    CC: high blood pressures Subjective:    Patient ID: Kristen Kirk, female    DOB: 03-Feb-1951, 66 y.o.   MRN: [id number]  HPI: Kristen Kirk is a 66 y.o. female presenting on 03/12/2017 for Hypertension (Pt reports high blood pressure at dialysis today.); Dizziness; Blurred Vision; and Headache   BP 200/90s at HD this morning. This was associated with dizziness, headache, blurred vision. Advised to seek ER care, pt declined. Here for further evaluation. BP running elevated at home and at HD for last month. Endorses headaches with dizziness. dizzness described as "room spinning" vertigo worse when supine. No vision changes, CP/tightness, SOB, leg swelling. Currently no headache.   No diet changes recently. Denies increased salt intake.   Compliant with BP regimen which includes amlodipine 39m daily, carvedilol 261mbid, lasix 409mID. Isosorbide mononitrate ER 49m85mD was started last month - mild headache associated with this.  Weight loss noted.   [facility] - high before and after dialysis.   Relevant past medical, surgical, family and social history reviewed and updated as indicated. Interim medical history since our last visit reviewed. Allergies and medications reviewed and updated. Outpatient Medications Prior to Visit  Medication Sig Dispense Refill  . amLODipine (NORVASC) 10 MG tablet TAKE 1 TABLET BY MOUTH  DAILY 90 tablet 1  . Ascorbic Acid (VITAMIN C) 100 MG tablet Take 100 mg by mouth daily.      . asMarland Kitchenirin 81 MG tablet Take 81 mg by mouth daily.    . Blood Glucose Monitoring Suppl (ONE TOUCH ULTRA SYSTEM KIT) W/DEVICE KIT 1 kit by Does not apply route once. 1 each 0  . carvedilol (COREG) 25 MG tablet Take 25 mg by mouth 2 (two)  times daily with a meal.    . Cetirizine HCl 10 MG CAPS Take 1 capsule (10 mg total) by mouth daily. 30 capsule 3  . Cholecalciferol (VITAMIN D3) 1000 UNITS CAPS Take 1 capsule by mouth 2 (two) times daily.      . cyclobenzaprine (FLEXERIL) 5 MG tablet Take 1-2 tablets (5-10 mg total) by mouth 2 (two) times daily as needed for muscle spasms. 40 tablet 0  . diclofenac sodium (VOLTAREN) 1 % GEL Apply 1 application topically 3 (three) times daily. 1 Tube 1  . dorzolamide-timolol (COSOPT) 22.3-6.8 MG/ML ophthalmic solution Place 1 drop into both eyes 3 (three) times daily.      . esMarland Kitchenmeprazole (NEXIUM) 40 MG capsule Take 1 capsule (40 mg total) by mouth daily. 30 capsule 3  . furosemide (LASIX) 40 MG tablet TAKE 1 TABLET BY MOUTH TWO  TIMES DAILY 180 tablet 2  . glucose blood test strip Check sugars once daily 100 each 11  . hydrOXYzine (ATARAX/VISTARIL) 25 MG tablet Take 1 tablet (25 mg total) by mouth 2 (two) times daily as needed.    . meclizine (ANTIVERT) 25 MG tablet Take 1 tablet (25 mg total) by mouth 3 (three) times daily as needed. 30 tablet 1  . Multiple Vitamin (MULTIVITAMIN) tablet Take 1 tablet by mouth daily.      . polyethylene glycol (MIRALAX / GLYCOLAX) packet  Take 17 g by mouth daily as needed for moderate constipation. 14 each 0  . pravastatin (PRAVACHOL) 80 MG tablet TAKE 1 TABLET BY MOUTH  EVERY EVENING 90 tablet 1  . sevelamer carbonate (RENVELA) 800 MG tablet Take 800 mg by mouth 3 (three) times daily with meals.    . sodium bicarbonate 650 MG tablet Take 1 tablet (650 mg total) by mouth 3 (three) times daily.    . sucralfate (CARAFATE) 1 g tablet Take 1 tablet (1 g total) by mouth daily. 30 tablet 0  . travoprost, benzalkonium, (TRAVATAN) 0.004 % ophthalmic solution Place 1 drop into both eyes at bedtime.      . traZODone (DESYREL) 50 MG tablet Take 50 mg by mouth at bedtime as needed.     . vitamin E 100 UNIT capsule Take 400 Units by mouth daily.     Marland Kitchen lidocaine (XYLOCAINE) 2  % solution Use as directed 20 mLs in the mouth or throat as needed (gargle at least 15 seconds then spit). 100 mL 0   No facility-administered medications prior to visit.      Per HPI unless specifically indicated in ROS section below Review of Systems     Objective:    BP (!) 166/84   Pulse 63   Temp 98.2 F (36.8 C) (Oral)   Ht 5' 3" (1.6 m)   Wt 141 lb 4 oz (64.1 kg)   LMP  (LMP Unknown)   SpO2 98%   BMI 25.02 kg/m   Wt Readings from Last 3 Encounters:  03/12/17 141 lb 4 oz (64.1 kg)  01/11/17 144 lb 4 oz (65.4 kg)  04/03/16 146 lb (66.2 kg)    Physical Exam  Constitutional: She is oriented to person, place, and time. She appears well-developed and well-nourished. No distress.  HENT:  Head: Normocephalic and atraumatic.  Mouth/Throat: Oropharynx is clear and moist. No oropharyngeal exudate.  Eyes: Conjunctivae are normal. Pupils are equal, round, and reactive to light.  Neck: Normal range of motion. Neck supple. No thyromegaly present.  Cardiovascular: Normal rate, regular rhythm, normal heart sounds and intact distal pulses.   No murmur heard. Pulmonary/Chest: Effort normal and breath sounds normal. No respiratory distress. She has no wheezes. She has no rales.  Musculoskeletal: She exhibits no edema.  Neurological: She is alert and oriented to person, place, and time.  Dix hallpike negative on left, positive on right epley canalith repositioning maneuvers performed today  Skin: Skin is warm and dry. No rash noted.  Psychiatric: She has a normal mood and affect.  Nursing note and vitals reviewed.  Results for orders placed or performed in visit on 04/03/16  HM MAMMOGRAPHY  Result Value Ref Range   HM Mammogram 0-4 Bi-Rad 0-4 Bi-Rad, Self Reported Normal   Lab Results  Component Value Date   CREATININE 7.60 ([place]) 03/28/2016      Assessment & Plan:   Problem List Items Addressed This Visit    ESRD (end stage renal disease) on dialysis ([place])   HTN  (hypertension) - Primary    Deteriorated bp control despite recent addition of isosorbide ER 75m BID by renal. We will add today hydralazine 22mBID today and I recommended schedule f/u appt in office with nephrologist. Pt to update me with effect over the next week.  Reviewed ER precautions with patient in detail.       Relevant Medications   isosorbide mononitrate (IMDUR) 30 MG 24 hr tablet   hydrALAZINE (APRESOLINE) 25 MG tablet  Vertigo    Dix hallpike positive on right. Somewhat prolonged duration for BPPV but anticipate this is cause. Provided with home modified epley exercises.           Follow up plan: Return if symptoms worsen or fail to improve.  Ria Bush, MD

## 2017-03-12 NOTE — Assessment & Plan Note (Addendum)
Deteriorated bp control despite recent addition of isosorbide ER 30mg  BID by renal. We will add today hydralazine 25mg  BID today and I recommended schedule f/u appt in office with nephrologist. Pt to update me with effect over the next week.  Reviewed ER precautions with patient in detail.

## 2017-03-12 NOTE — Telephone Encounter (Signed)
plz offer appt today 4:15pm.

## 2017-03-12 NOTE — Telephone Encounter (Signed)
Patient Name: Kristen Kirk  DOB: 04-14-1951    Initial Comment Caller states she is needing to schedule an appt. She is having high BP. Current BP: 197/73, 200/65, off and on.    Nurse Assessment  Nurse: Leilani Merl, RN, Heather Date/Time (Eastern Time): 03/12/2017 1:16:45 PM  Confirm and document reason for call. If symptomatic, describe symptoms. ---Caller states she is needing to schedule an appt. She is having high BP. Current BP: 197/73, 200/65, off and on for the last few weeks.  Does the patient have any new or worsening symptoms? ---Yes  Will a triage be completed? ---Yes  Related visit to physician within the last 2 weeks? ---No  Does the PT have any chronic conditions? (i.e. diabetes, asthma, etc.) ---Yes  List chronic conditions. ---See MR  Is this a behavioral health or substance abuse call? ---No     Guidelines    Guideline Title Affirmed Question Affirmed Notes  High Blood Pressure [4] Systolic BP >= 166 OR Diastolic >= 063 AND [0] cardiac or neurologic symptoms (e.g., chest pain, difficulty breathing, unsteady gait, blurred vision)    Final Disposition User   Go to ED Now [place], RN, [facility] the office, [phone] and information given.   Referrals  GO TO FACILITY REFUSED   Disagree/Comply: Disagree  Disagree/Comply Reason: Disagree with instructions

## 2017-03-12 NOTE — Assessment & Plan Note (Signed)
Dix hallpike positive on right. Somewhat prolonged duration for BPPV but anticipate this is cause. Provided with home modified epley exercises.

## 2017-03-12 NOTE — Telephone Encounter (Signed)
Pt sch for 1615 today.

## 2017-04-01 DIAGNOSIS — Z940 Kidney transplant status: Secondary | ICD-10-CM

## 2017-04-01 HISTORY — DX: Kidney transplant status: Z94.0

## 2017-04-02 HISTORY — PX: KIDNEY TRANSPLANT: SHX239

## 2017-04-03 HISTORY — PX: KIDNEY TRANSPLANT: SHX239

## 2017-04-06 ENCOUNTER — Encounter: Payer: Self-pay | Admitting: Family Medicine

## 2017-04-13 ENCOUNTER — Telehealth: Payer: Self-pay

## 2017-04-13 NOTE — Telephone Encounter (Signed)
Noted. Thanks.

## 2017-04-13 NOTE — Telephone Encounter (Signed)
Amy PT with [facility] left v/m to notify Dr Darnell Level that pt is back in hospital at [facility] and pt has not been admitted to [facility] for PT yet. FYI to Dr Darnell Level.

## 2017-04-17 ENCOUNTER — Other Ambulatory Visit: Payer: Self-pay | Admitting: Family Medicine

## 2017-05-27 ENCOUNTER — Other Ambulatory Visit: Payer: Self-pay | Admitting: Family Medicine

## 2017-06-19 ENCOUNTER — Encounter: Payer: Self-pay | Admitting: Family Medicine

## 2017-06-19 ENCOUNTER — Ambulatory Visit (INDEPENDENT_AMBULATORY_CARE_PROVIDER_SITE_OTHER): Payer: 59 | Admitting: Family Medicine

## 2017-06-19 VITALS — BP 108/62 | HR 64 | Temp 98.1°F | Wt 132.5 lb

## 2017-06-19 DIAGNOSIS — Z940 Kidney transplant status: Secondary | ICD-10-CM | POA: Diagnosis not present

## 2017-06-19 DIAGNOSIS — M25511 Pain in right shoulder: Secondary | ICD-10-CM | POA: Insufficient documentation

## 2017-06-19 NOTE — Progress Notes (Signed)
BP 108/62 (BP Location: Right Arm, Patient Position: Sitting, Cuff Size: Normal)   Pulse 64   Temp 98.1 F (36.7 C) (Oral)   Wt 132 lb 8 oz (60.1 kg)   LMP  (LMP Unknown)   SpO2 98%   BMI 23.47 kg/m    CC: R shoulder pain Subjective:    Patient ID: Kristen Kirk, female    DOB: 1951-01-01, 66 y.o.   MRN: [id number]  HPI: Kristen Kirk is a 66 y.o. female presenting on 06/19/2017 for Shoulder Pain (Sore pain when lifting right arm. Started about 3 wks ago)   3 wk h/o R shoulder pain without inciting trauma/injury. Movement in bed hurts.   Lifting restriction at baseline to 5 lbs.   No neck pain. No numbness or weakness down arm, no shooting pain down arm.   S/p kidney transplant 04/2017!  Some chills with meals since this. Also staying fatigued with minimal exertion.   Relevant past medical, surgical, family and social history reviewed and updated as indicated. Interim medical history since our last visit reviewed. Allergies and medications reviewed and updated. Outpatient Medications Prior to Visit  Medication Sig Dispense Refill  . amLODipine (NORVASC) 10 MG tablet TAKE 1 TABLET BY MOUTH  DAILY 90 tablet 1  . Ascorbic Acid (VITAMIN C) 100 MG tablet Take 100 mg by mouth daily.      Marland Kitchen aspirin 81 MG tablet Take 81 mg by mouth daily.    . Blood Glucose Monitoring Suppl (ONE TOUCH ULTRA SYSTEM KIT) W/DEVICE KIT 1 kit by Does not apply route once. 1 each 0  . carvedilol (COREG) 25 MG tablet Take 25 mg by mouth 2 (two) times daily with a meal.    . Cetirizine HCl 10 MG CAPS Take 1 capsule (10 mg total) by mouth daily. 30 capsule 3  . Cholecalciferol (VITAMIN D3) 1000 UNITS CAPS Take 1 capsule by mouth 2 (two) times daily.      . cyclobenzaprine (FLEXERIL) 5 MG tablet Take 1-2 tablets (5-10 mg total) by mouth 2 (two) times daily as needed for muscle spasms. 40 tablet 0  . diclofenac sodium (VOLTAREN) 1 % GEL Apply 1 application topically 3 (three) times daily. 1 Tube 1  .  dorzolamide-timolol (COSOPT) 22.3-6.8 MG/ML ophthalmic solution Place 1 drop into both eyes 3 (three) times daily.      Marland Kitchen esomeprazole (NEXIUM) 40 MG capsule Take 1 capsule (40 mg total) by mouth daily. 30 capsule 3  . furosemide (LASIX) 40 MG tablet TAKE 1 TABLET BY MOUTH TWO  TIMES DAILY 180 tablet 1  . glucose blood test strip Check sugars once daily 100 each 11  . hydrALAZINE (APRESOLINE) 25 MG tablet Take 1 tablet (25 mg total) by mouth 2 (two) times daily. 60 tablet 1  . hydrOXYzine (ATARAX/VISTARIL) 25 MG tablet Take 1 tablet (25 mg total) by mouth 2 (two) times daily as needed.    . isosorbide mononitrate (IMDUR) 30 MG 24 hr tablet Take 30 mg by mouth 2 (two) times daily.    . meclizine (ANTIVERT) 25 MG tablet Take 1 tablet (25 mg total) by mouth 3 (three) times daily as needed. 30 tablet 1  . Multiple Vitamin (MULTIVITAMIN) tablet Take 1 tablet by mouth daily.      . polyethylene glycol (MIRALAX / GLYCOLAX) packet Take 17 g by mouth daily as needed for moderate constipation. 14 each 0  . pravastatin (PRAVACHOL) 80 MG tablet TAKE 1 TABLET BY MOUTH  EVERY EVENING  90 tablet 2  . sevelamer carbonate (RENVELA) 800 MG tablet Take 800 mg by mouth 3 (three) times daily with meals.    . sodium bicarbonate 650 MG tablet Take 1 tablet (650 mg total) by mouth 3 (three) times daily.    . sucralfate (CARAFATE) 1 g tablet Take 1 tablet (1 g total) by mouth daily. 30 tablet 0  . travoprost, benzalkonium, (TRAVATAN) 0.004 % ophthalmic solution Place 1 drop into both eyes at bedtime.      . traZODone (DESYREL) 50 MG tablet Take 50 mg by mouth at bedtime as needed.     . vitamin E 100 UNIT capsule Take 400 Units by mouth daily.      No facility-administered medications prior to visit.      Per HPI unless specifically indicated in ROS section below Review of Systems     Objective:    BP 108/62 (BP Location: Right Arm, Patient Position: Sitting, Cuff Size: Normal)   Pulse 64   Temp 98.1 F (36.7 C)  (Oral)   Wt 132 lb 8 oz (60.1 kg)   LMP  (LMP Unknown)   SpO2 98%   BMI 23.47 kg/m   Wt Readings from Last 3 Encounters:  06/19/17 132 lb 8 oz (60.1 kg)  03/12/17 141 lb 4 oz (64.1 kg)  01/11/17 144 lb 4 oz (65.4 kg)    Physical Exam  Constitutional: She is oriented to person, place, and time. She appears well-developed and well-nourished. No distress.  Musculoskeletal: She exhibits no edema.  FROM at neck without pain L shoulder WNL R shoulder exam: No deformity of shoulders on inspection. No pain with palpation of shoulder landmarks. FROM in abduction and forward flexion. Discomfort with testing SITS in ext/int rotation. + pain with empty can sign. + mild discomfort with speed test. No impingement. No pain with crossover test. No pain with rotation of humeral head in [place] joint.   Neurological: She is alert and oriented to person, place, and time.  Skin: Skin is warm and dry. No rash noted.  Nursing note and vitals reviewed.     Assessment & Plan:   Problem List Items Addressed This Visit    Kidney transplant status   Right shoulder pain - Primary    Anticipate R RTC tendonitis. Supportive care reviewed. Avoid NSAIDs given recent kidney transplant. See pt instructions. rec heating pad/ ice, stretching exercises. Update if not improving, consider outpatient PT course.           Follow up plan: Return if symptoms worsen or fail to improve.  Ria Bush, MD

## 2017-06-19 NOTE — Patient Instructions (Addendum)
I think you have rotator cuff tendonitis.  Treat with ice or heating pad (whichever soothes better) as well as stretching exercises provided today with resistance band. May use tylenol for discomfort as needed. Let us know if not improving with treatment.  Fax [place] your medicine list to [phone] attention Dr Darnell Level.

## 2017-06-19 NOTE — Assessment & Plan Note (Signed)
Anticipate R RTC tendonitis. Supportive care reviewed. Avoid NSAIDs given recent kidney transplant. See pt instructions. rec heating pad/ ice, stretching exercises. Update if not improving, consider outpatient PT course.

## 2017-07-03 ENCOUNTER — Telehealth: Payer: Self-pay | Admitting: Family Medicine

## 2017-07-03 NOTE — Telephone Encounter (Signed)
Pt has appt with Dr Darnell Level on 07/04/17 at 1pm.

## 2017-07-03 NOTE — Telephone Encounter (Signed)
PLEASE NOTE: All timestamps contained within this report are represented as [place] Standard Time. CONFIDENTIALTY NOTICE: This fax transmission is intended only for the addressee. It contains information that is legally privileged, confidential or otherwise protected from use or disclosure. If you are not the intended recipient, you are strictly prohibited from reviewing, disclosing, copying using or disseminating any of this information or taking any action in reliance on or regarding this information. If you have received this fax in error, please notify us immediately by telephone so that we can arrange for its return to [place]. Phone: [phone], Toll-Free: [phone], Fax: [phone] Page: 1 of 2 Call Id: [id number] Dane Patient Name: Kristen Kirk Gender: Female DOB: 1950-10-30 Age: 66 Y 10 M 7 D Return Phone Number: [phone] (Primary) Address: City/State/Zip: [place] [address] Client [facility] - Client Client Site [place] Physician Ria Bush - MD Contact Type Call Who Is Calling Patient / Member / Family / Caregiver Call Type Triage / Clinical Relationship To Patient Self Return Phone Number [phone] (Primary) Chief Complaint Abdominal Pain Reason for Call Symptomatic / Request for [place] states she has Diarrhea. It started this morning. Wants to know what she can take. She's urinating. Having abdominal pain. She had a kidney transplant in July. Appointment Disposition EMR Patient Reports Appointment Already Scheduled Info pasted into Epic Yes Translation No Nurse Assessment Nurse: Thad Ranger, RN, Langley Gauss Date/Time (Eastern Time): 07/03/2017 2:32:49 PM Confirm and document reason for call. If symptomatic, describe symptoms. ---Caller states she has Diarrhea. It started  this morning. Wants to know what she can take. She's urinating. Having abdominal pain. She had a kidney transplant in July. No fever or vomiting. Does the patient have any new or worsening symptoms? ---Yes Will a triage be completed? ---Yes Related visit to physician within the last 2 weeks? ---No Does the PT have any chronic conditions? (i.e. diabetes, asthma, etc.) ---Yes List chronic conditions. ---Kidney transplant, HTN Is this a behavioral health or substance abuse call? ---No Guidelines Guideline Title Affirmed Question Affirmed Notes Nurse Date/Time (Eastern Time) Diarrhea Weak immune system (e.g., HIV positive, cancer chemo, splenectomy, organ transplant, chronic steroids) Carmon, RN, Langley Gauss 07/03/2017 2:35:43 PM Disp. Time Eilene Ghazi Time) Disposition Final User 07/03/2017 3:00:43 PM Call Completed Thad Ranger, RN, Langley Gauss PLEASE NOTE: All timestamps contained within this report are represented as [place] Standard Time. CONFIDENTIALTY NOTICE: This fax transmission is intended only for the addressee. It contains information that is legally privileged, confidential or otherwise protected from use or disclosure. If you are not the intended recipient, you are strictly prohibited from reviewing, disclosing, copying using or disseminating any of this information or taking any action in reliance on or regarding this information. If you have received this fax in error, please notify us immediately by telephone so that we can arrange for its return to [place]. Phone: [phone], Toll-Free: [phone], Fax: [phone] Page: 2 of 2 Call Id: [id number] 07/03/2017 2:42:28 PM See Physician within 24 Hours Yes Clinton, RN, Yevette Edwards Disagree/Comply Comply Caller Understands Yes PreDisposition Call Doctor Care Advice Given Per Guideline SEE PHYSICIAN WITHIN 24 HOURS: FLUID THERAPY DURING MILD-MODERATE DIARRHEA: * Drink more fluids, at least 8-10 cups daily. One cup equals 8 oz (240 ml). * SPORTS  DRINKS: You can also drink a sports drinks (e.g., Gatorade, Powerade) to help treat and prevent dehydration. For it to work  best, mix it half and half with water. DO NOT USE - Bismuth Subsalicylate (e.g., Kaopectate, Pepto-Bismol): * Do not take Kaopectate or Pepto-Bismol for the diarrhea. * Reason: Diarrhea in immunocompromised patients is often chronic and there could be side effects from taking it chronically. FOOD AND NUTRITION DURING MILD-MODERATE DIARRHEA * Maintaining some food intake during episodes of diarrhea is important. * Begin with boiled starches / cereals (e.g., potatoes, rice, noodles, wheat, oats) with a small amount of salt to taste. * Other foods that are OK include: bananas, yogurt, crackers, soup. * As the diarrhea starts to get better, you can slowly return to a normal diet. CALL BACK IF: * Signs of dehydration occur (e.g., no urine over 12 hours, very dry mouth, lightheaded, etc.) * Bloody stools * Constant or severe abdominal pain * You become worse. CARE ADVICE given per Diarrhea (Adult) guideline. Comments User: Romeo Apple, RN Date/Time Eilene Ghazi Time): 07/03/2017 2:44:07 PM Based on RN clinical knowledge, advised do not take any OTC antidiarrheal meds due to hx of kidney transplant. Pt states she was seen in ER yesterday for weakness, and they made her an appt at the [facility] for tomorrow, 07/04/17 at 1300. Advised to keep appt as advised. Referrals REFERRED TO PCP OFFICE

## 2017-07-03 NOTE — Telephone Encounter (Signed)
Patient was seen at [facility] ER yesterday.  Patient was seen for fatigue.  They wanted patient to follow up with Dr.G today.  There are no openings.  There are some 15 minute openings for tomorrow.  When can I schedule patient's follow up appointment?  Patient said she's feeling alright today, but she does have diarrhea. Please advise.

## 2017-07-03 NOTE — Telephone Encounter (Signed)
I called patient and let her know we could schedule an appointment for tomorrow.  Patient said Dr.Pankal Jawa's,Neurologist,office called and scheduled an appointment for her on 07/12/17.  She said she wasn't sure why she needed to follow up with Dr.G.  I asked her if she still had the diarrhea.  She said yes.  She said the diarrhea started this morning and she didn't have it when she went to the ER.  She scheduled appointment with Dr.G for tomorrow at 1:00 to be seen about the diarrhea.  I let her know if the diarrhea has stopped by morning, she could call me and we can cancel the appointment.

## 2017-07-03 NOTE — Telephone Encounter (Signed)
May place in 15 min slot for tomorrow. thanks

## 2017-07-04 ENCOUNTER — Encounter: Payer: Self-pay | Admitting: Family Medicine

## 2017-07-04 ENCOUNTER — Ambulatory Visit (INDEPENDENT_AMBULATORY_CARE_PROVIDER_SITE_OTHER): Payer: 59 | Admitting: Family Medicine

## 2017-07-04 VITALS — BP 102/56 | HR 70 | Temp 98.3°F | Wt 132.5 lb

## 2017-07-04 DIAGNOSIS — G4700 Insomnia, unspecified: Secondary | ICD-10-CM

## 2017-07-04 DIAGNOSIS — Z940 Kidney transplant status: Secondary | ICD-10-CM | POA: Diagnosis not present

## 2017-07-04 DIAGNOSIS — R197 Diarrhea, unspecified: Secondary | ICD-10-CM | POA: Diagnosis not present

## 2017-07-04 MED ORDER — TRAZODONE HCL 50 MG PO TABS: 25.0000 mg | ORAL_TABLET | Freq: Every evening | ORAL | 3 refills | Status: DC | PRN

## 2017-07-04 NOTE — Patient Instructions (Addendum)
Pass by lab for C diff stool test.  Possible viral stomach bug. Supportive care for now - push fluids, bland diet.  Trial trazodone 25-50mg  at bedtime for sleep. Let me know how this helps.

## 2017-07-04 NOTE — Assessment & Plan Note (Signed)
1d h/o watery diarrhea after recent ER visit. Anticipate viral gastroenteritis. Reviewed supportive care with good hydration, bland diet. Update if not improving with treatment. Check stool for C diff today.

## 2017-07-04 NOTE — Assessment & Plan Note (Signed)
Melatonin 3mg  nightly may have helped some.  She has not been taking trazodone - rec she start 25-50mg  nightly. Update if not improving with this.

## 2017-07-04 NOTE — Assessment & Plan Note (Addendum)
Ongoing fatigue since transplant.  Upcoming appt next week with kidney transplant team.

## 2017-07-04 NOTE — Progress Notes (Signed)
BP (!) 102/56 (BP Location: Right Arm, Patient Position: Sitting, Cuff Size: Normal)   Pulse 70   Temp 98.3 F (36.8 C) (Oral)   Wt 132 lb 8 oz (60.1 kg)   LMP  (LMP Unknown)   SpO2 98%   BMI 23.47 kg/m    CC: diarrhea Subjective:    Patient ID: MALLARIE VOORHIES, female    DOB: 1951-01-05, 66 y.o.   MRN: [id number]  HPI: ALAYIAH FONTES is a 66 y.o. female presenting on 07/04/2017 for Diarrhea (Started yesterday morning. Has not taken anything. Also c/o fatigue)   Kidney transplant 04/2017.   Seen at [facility] ER over weekend, records reviewed - seen due to ongoing fatigue after kidney transplant. Attributed to slow recovery after transplant. Lab evaluation reassuring. Treated for insomnia with melatonin 12m nightly. Cr at ER 1.3 (stable). She has not been using trazodone.   Yesterday started having watery diarrhea x5 yesterday, twice overnight. Again today x2, watery. She has had some abdominal discomfort as well described as stomachache. No fevers. Not more foul smelling than normal. No sick contacts at home - husband doesn't have diarrhea. No nausea/vomiting, urinary changes or dysuria. No bood in urine.  She is trying to stay well hydrated with plenty of water - keeps water bottle nearby.   Has f/u scheduled with nephrologist team (Dr PHarrietta Guardian for 07/12/2017.   Relevant past medical, surgical, family and social history reviewed and updated as indicated. Interim medical history since our last visit reviewed. Allergies and medications reviewed and updated. Outpatient Medications Prior to Visit  Medication Sig Dispense Refill  . amLODipine (NORVASC) 10 MG tablet TAKE 1 TABLET BY MOUTH  DAILY 90 tablet 1  . Ascorbic Acid (VITAMIN C) 100 MG tablet Take 100 mg by mouth daily.      .Marland Kitchenaspirin 81 MG tablet Take 81 mg by mouth daily.    . Blood Glucose Monitoring Suppl (ONE TOUCH ULTRA SYSTEM KIT) W/DEVICE KIT 1 kit by Does not apply route once. 1 each 0  . carvedilol (COREG) 25 MG  tablet Take 25 mg by mouth 2 (two) times daily with a meal.    . Cetirizine HCl 10 MG CAPS Take 1 capsule (10 mg total) by mouth daily. 30 capsule 3  . Cholecalciferol (VITAMIN D3) 1000 UNITS CAPS Take 1 capsule by mouth 2 (two) times daily.      . cyclobenzaprine (FLEXERIL) 5 MG tablet Take 1-2 tablets (5-10 mg total) by mouth 2 (two) times daily as needed for muscle spasms. 40 tablet 0  . diclofenac sodium (VOLTAREN) 1 % GEL Apply 1 application topically 3 (three) times daily. 1 Tube 1  . dorzolamide-timolol (COSOPT) 22.3-6.8 MG/ML ophthalmic solution Place 1 drop into both eyes 3 (three) times daily.      .Marland Kitchenesomeprazole (NEXIUM) 40 MG capsule Take 1 capsule (40 mg total) by mouth daily. 30 capsule 3  . furosemide (LASIX) 40 MG tablet TAKE 1 TABLET BY MOUTH TWO  TIMES DAILY 180 tablet 1  . glucose blood test strip Check sugars once daily 100 each 11  . hydrALAZINE (APRESOLINE) 25 MG tablet Take 1 tablet (25 mg total) by mouth 2 (two) times daily. 60 tablet 1  . hydrOXYzine (ATARAX/VISTARIL) 25 MG tablet Take 1 tablet (25 mg total) by mouth 2 (two) times daily as needed.    . isosorbide mononitrate (IMDUR) 30 MG 24 hr tablet Take 30 mg by mouth 2 (two) times daily.    . meclizine (ANTIVERT)  25 MG tablet Take 1 tablet (25 mg total) by mouth 3 (three) times daily as needed. 30 tablet 1  . Multiple Vitamin (MULTIVITAMIN) tablet Take 1 tablet by mouth daily.      . polyethylene glycol (MIRALAX / GLYCOLAX) packet Take 17 g by mouth daily as needed for moderate constipation. 14 each 0  . pravastatin (PRAVACHOL) 80 MG tablet TAKE 1 TABLET BY MOUTH  EVERY EVENING 90 tablet 2  . sevelamer carbonate (RENVELA) 800 MG tablet Take 800 mg by mouth 3 (three) times daily with meals.    . sodium bicarbonate 650 MG tablet Take 1 tablet (650 mg total) by mouth 3 (three) times daily.    . sucralfate (CARAFATE) 1 g tablet Take 1 tablet (1 g total) by mouth daily. 30 tablet 0  . travoprost, benzalkonium, (TRAVATAN)  0.004 % ophthalmic solution Place 1 drop into both eyes at bedtime.      . vitamin E 100 UNIT capsule Take 400 Units by mouth daily.     . traZODone (DESYREL) 50 MG tablet Take 50 mg by mouth at bedtime as needed.      No facility-administered medications prior to visit.      Per HPI unless specifically indicated in ROS section below Review of Systems     Objective:    BP (!) 102/56 (BP Location: Right Arm, Patient Position: Sitting, Cuff Size: Normal)   Pulse 70   Temp 98.3 F (36.8 C) (Oral)   Wt 132 lb 8 oz (60.1 kg)   LMP  (LMP Unknown)   SpO2 98%   BMI 23.47 kg/m   Wt Readings from Last 3 Encounters:  07/04/17 132 lb 8 oz (60.1 kg)  06/19/17 132 lb 8 oz (60.1 kg)  03/12/17 141 lb 4 oz (64.1 kg)    Physical Exam  Constitutional: She appears well-developed and well-nourished. No distress.  HENT:  Mouth/Throat: Oropharynx is clear and moist. No oropharyngeal exudate.  Cardiovascular: Normal rate, regular rhythm, normal heart sounds and intact distal pulses.   No murmur heard. Pulmonary/Chest: Effort normal and breath sounds normal. No respiratory distress. She has no wheezes. She has no rales.  Abdominal: Soft. Normal appearance and bowel sounds are normal. She exhibits no distension and no mass. There is tenderness (mild) in the epigastric area. There is no rigidity, no rebound, no guarding, no CVA tenderness and negative Murphy's sign.  Musculoskeletal: She exhibits no edema.  Skin: Skin is warm and dry. No rash noted.  Psychiatric: She has a normal mood and affect.  Nursing note and vitals reviewed.  Results for orders placed or performed in visit on 04/03/16  HM MAMMOGRAPHY  Result Value Ref Range   HM Mammogram 0-4 Bi-Rad 0-4 Bi-Rad, Self Reported Normal      Assessment & Plan:   Problem List Items Addressed This Visit    Diarrhea - Primary    1d h/o watery diarrhea after recent ER visit. Anticipate viral gastroenteritis. Reviewed supportive care with good  hydration, bland diet. Update if not improving with treatment. Check stool for C diff today.       Relevant Orders   C. difficile GDH and Toxin A/B   Insomnia    Melatonin 33m nightly may have helped some.  She has not been taking trazodone - rec she start 25-56mnightly. Update if not improving with this.       Kidney transplant status    Ongoing fatigue since transplant.  Upcoming appt next week with kidney transplant  team.           Follow up plan: Return if symptoms worsen or fail to improve.  Ria Bush, MD

## 2017-07-09 NOTE — Addendum Note (Signed)
Addended by: Ellamae Sia on: 07/09/2017 10:23 AM   Modules accepted: Orders

## 2017-07-12 NOTE — Addendum Note (Signed)
Addended by: Ellamae Sia on: 07/12/2017 12:58 PM   Modules accepted: Orders

## 2017-07-13 LAB — C. DIFFICILE GDH AND TOXIN A/B
GDH ANTIGEN: NOT DETECTED
MICRO NUMBER:: 81135932
SPECIMEN QUALITY:: ADEQUATE
TOXIN A AND B: NOT DETECTED

## 2017-07-17 ENCOUNTER — Other Ambulatory Visit: Payer: Self-pay | Admitting: Family Medicine

## 2017-08-06 ENCOUNTER — Ambulatory Visit: Payer: Self-pay

## 2017-08-06 NOTE — Telephone Encounter (Signed)
Pt with head and chest congestion. Pt with productive cough with thick yellow secretions. Next appt was for 08/07/17 @ 0830.  After discussion, pt states they will go to the nearest Urgent Care in [place] instead.  Reason for Disposition . Transplant patient (e.g., kidney, liver, lung, heart)  Answer Assessment - Initial Assessment Questions 1. TEMPERATURE: "What is the most recent temperature?"  "How was it measured?"      100.0 oral  2. ONSET: "When did the fever start?"      2 days ago 3. SYMPTOMS: "Do you have any other symptoms besides the fever?"  (e.g., colds, headache, sore throat, earache, cough, rash, diarrhea, vomiting, abdominal pain)     Head congestion cough-thick phlegm yellow 4. CAUSE: If there are no symptoms, ask: "What do you think is causing the fever?"      She thinks its the flu 5. CONTACTS: "Does anyone else in the family have an infection?"     no 6. TREATMENT: "What have you done so far to treat this fever?" (e.g., medications)     nothing 7. IMMUNOCOMPROMISE: "Do you have of the following: diabetes, HIV positive, splenectomy, cancer chemotherapy, chronic steroid treatment, transplant patient, etc."     Transplant pt immunosupression therapy 8. PREGNANCY: "Is there any chance you are pregnant?" "When was your last menstrual period?"     n/a 9. TRAVEL: "Have you traveled out of the country in the last month?" (e.g., travel history, exposures)     n/a  Protocols used: FEVER-A-AH

## 2017-08-07 ENCOUNTER — Ambulatory Visit: Payer: 59 | Admitting: Internal Medicine

## 2017-10-02 HISTORY — PX: LIGATION OF ARTERIOVENOUS  FISTULA: SHX5948

## 2017-10-30 HISTORY — PX: LIGATION OF ARTERIOVENOUS  FISTULA: SHX5948

## 2017-11-18 ENCOUNTER — Encounter: Payer: Self-pay | Admitting: Family Medicine

## 2018-01-28 ENCOUNTER — Other Ambulatory Visit: Payer: Self-pay | Admitting: Family Medicine

## 2018-03-19 ENCOUNTER — Other Ambulatory Visit: Payer: Self-pay | Admitting: Family Medicine

## 2018-03-25 ENCOUNTER — Other Ambulatory Visit: Payer: Self-pay | Admitting: Family Medicine

## 2018-03-25 DIAGNOSIS — Z1231 Encounter for screening mammogram for malignant neoplasm of breast: Secondary | ICD-10-CM

## 2018-03-28 ENCOUNTER — Ambulatory Visit
Admission: RE | Admit: 2018-03-28 | Discharge: 2018-03-28 | Disposition: A | Discharge status: Home / Self Care | Payer: Medicare Other | Source: Ambulatory Visit | Attending: Family Medicine | Admitting: Family Medicine

## 2018-03-28 DIAGNOSIS — Z1231 Encounter for screening mammogram for malignant neoplasm of breast: Secondary | ICD-10-CM | POA: Diagnosis present

## 2018-04-03 ENCOUNTER — Other Ambulatory Visit: Payer: Self-pay | Admitting: Family Medicine

## 2018-04-03 DIAGNOSIS — R928 Other abnormal and inconclusive findings on diagnostic imaging of breast: Secondary | ICD-10-CM

## 2018-04-10 ENCOUNTER — Ambulatory Visit
Admission: RE | Admit: 2018-04-10 | Discharge: 2018-04-10 | Disposition: A | Discharge status: Home / Self Care | Payer: Medicare Other | Source: Ambulatory Visit | Attending: Family Medicine | Admitting: Family Medicine

## 2018-04-10 DIAGNOSIS — R928 Other abnormal and inconclusive findings on diagnostic imaging of breast: Secondary | ICD-10-CM

## 2018-04-10 LAB — HM MAMMOGRAPHY

## 2018-04-11 ENCOUNTER — Encounter: Payer: Self-pay | Admitting: Family Medicine

## 2018-05-30 ENCOUNTER — Other Ambulatory Visit: Payer: Self-pay | Admitting: Family Medicine

## 2018-05-30 NOTE — Telephone Encounter (Signed)
Copied from [place] [id number]. Topic: Quick Communication - See Telephone Encounter >> May 30, 2018 11:39 AM Mylinda Latina, NT wrote: CRM for notification. See Telephone encounter for: 05/30/18. Patient called and states she needs a refill of her  benzalkonium, (TRAVATAN) 0.004 % ophthalmic solution,dorzolamide-timolol (COSOPT) 22.3-6.8 MG/ML ophthalmic solution  [facility] [place], [place] [phone] (Phone) [phone] (Fax)

## 2018-05-30 NOTE — Telephone Encounter (Signed)
Rx refill request: benzalkonium ophthalmic solution 0.004%  Ordered: 06/06/11 historical provider                            Dorzolamide-timolol ophthalmic solution      Ordered: 06/06/11 historical provider  LOV: 07/04/17 acute  PCP: [place]: Claria Dice Rx

## 2018-05-31 NOTE — Telephone Encounter (Signed)
Next OV: none

## 2018-06-01 NOTE — Telephone Encounter (Signed)
These medicines should come from ophtho

## 2018-06-10 NOTE — Telephone Encounter (Signed)
Spoke with pt relaying Dr. G's message. Pt verbalizes understanding.  

## 2018-07-02 ENCOUNTER — Ambulatory Visit (INDEPENDENT_AMBULATORY_CARE_PROVIDER_SITE_OTHER): Payer: Medicare Other | Admitting: Family Medicine

## 2018-07-02 ENCOUNTER — Encounter (INDEPENDENT_AMBULATORY_CARE_PROVIDER_SITE_OTHER): Payer: Self-pay

## 2018-07-02 ENCOUNTER — Encounter: Payer: Self-pay | Admitting: Family Medicine

## 2018-07-02 VITALS — BP 130/70 | HR 66 | Temp 98.3°F | Ht 63.0 in | Wt 144.5 lb

## 2018-07-02 DIAGNOSIS — K219 Gastro-esophageal reflux disease without esophagitis: Secondary | ICD-10-CM

## 2018-07-02 DIAGNOSIS — Z23 Encounter for immunization: Secondary | ICD-10-CM

## 2018-07-02 MED ORDER — FAMOTIDINE 20 MG PO TABS: 20.0000 mg | ORAL_TABLET | Freq: Two times a day (BID) | ORAL | Status: DC

## 2018-07-02 MED ORDER — MELATONIN 3 MG PO CAPS: 1.0000 | ORAL_CAPSULE | Freq: Every evening | ORAL | 0 refills | Status: AC | PRN

## 2018-07-02 MED ORDER — PANTOPRAZOLE SODIUM 20 MG PO TBEC: 20.0000 mg | DELAYED_RELEASE_TABLET | Freq: Every day | ORAL | 1 refills | Status: DC

## 2018-07-02 NOTE — Patient Instructions (Addendum)
Heartburn preventative measures: Head of bed elevated. Avoidance of citrus, fatty foods, chocolate, peppermint, and excessive alcohol, along with sodas, orange juice (acidic drinks) At least a few hours between dinner and bed, minimize naps after eating.  Take some pepcid over the counter once daily for 2-3 weeks. If no better with this, may fill protonix presciption printed out today - but check with kidney doctors about protonix.

## 2018-07-02 NOTE — Assessment & Plan Note (Addendum)
Anticipate uncomplicated GERD. rec trial pepcid 20mg  daily OTC. If no improvement with this, Rx for protonix 20mg  printed for patient - suggested she run it by kidney transplant team (seeing tomorrow). Update with effect. Pt agrees with plan.  She was previously treated with nexium.

## 2018-07-02 NOTE — Progress Notes (Signed)
BP 130/70 (BP Location: Right Arm, Patient Position: Sitting, Cuff Size: Normal)   Pulse 66   Temp 98.3 F (36.8 C) (Oral)   Ht _0  (1.6 m)   Wt 144 lb 8 oz (65.5 kg)   LMP  (LMP Unknown)   SpO2 96%   BMI 25.60 kg/m    CC: acid reflux Subjective:    Patient ID: Kristen Kirk, female    DOB: 17-Dec-1950, 67 y.o.   MRN: [id number]  HPI: Kristen Kirk is a 67 y.o. female presenting on 07/02/2018 for Gastroesophageal Reflux (C/o burning sensation in her throat. Started 1-2 wks ago. Says she is passing a lot of gas. )   S/p R kidney transplant through [facility] 04/2017. Last saw them for yearly check 04/2018, good report. Amlodipine was stopped. Continues coreg. She brings current medicine list which was updated.   2 wk h/o burning in throat associated with gas and burping. Denies recent change in diet - denies increased spicy foods. She drinks caffeinated beverages. Increased tomatoes as well.  Denies abd pain, nausea/vomiting or dysphagia. Appetite ok.   Relevant past medical, surgical, family and social history reviewed and updated as indicated. Interim medical history since our last visit reviewed. Allergies and medications reviewed and updated. Outpatient Medications Prior to Visit  Medication Sig Dispense Refill  . aspirin 81 MG tablet Take 81 mg by mouth daily.    . Blood Glucose Monitoring Suppl (ONE TOUCH ULTRA SYSTEM KIT) W/DEVICE KIT 1 kit by Does not apply route once. 1 each 0  . carvedilol (COREG) 25 MG tablet Take 1.5 tablets (37.5 mg total) by mouth 2 (two) times daily with a meal.    . Cholecalciferol (VITAMIN D3) 1000 units CAPS Take 2 capsules (2,000 Units total) by mouth daily. 60 capsule   . diclofenac sodium (VOLTAREN) 1 % GEL Apply 1 application topically 3 (three) times daily. 1 Tube 1  . dorzolamide-timolol (COSOPT) 22.3-6.8 MG/ML ophthalmic solution Place 1 drop into both eyes 3 (three) times daily.      Marland Kitchen glucose blood test strip Check sugars once daily 100  each 11  . Multiple Vitamin (MULTIVITAMIN) tablet Take 1 tablet by mouth daily.      . polyethylene glycol (MIRALAX / GLYCOLAX) packet Take 17 g by mouth daily as needed for moderate constipation. 14 each 0  . pravastatin (PRAVACHOL) 80 MG tablet TAKE 1 TABLET BY MOUTH  EVERY EVENING 90 tablet 0  . sodium bicarbonate 650 MG tablet Take 1 tablet (650 mg total) by mouth 2 (two) times daily.    . travoprost, benzalkonium, (TRAVATAN) 0.004 % ophthalmic solution Place 1 drop into both eyes at bedtime.      Marland Kitchen amLODipine (NORVASC) 10 MG tablet TAKE 1 TABLET BY MOUTH  DAILY 90 tablet 0  . Ascorbic Acid (VITAMIN C) 100 MG tablet Take 100 mg by mouth daily.      . carvedilol (COREG) 25 MG tablet Take 25 mg by mouth 2 (two) times daily with a meal.    . Cetirizine HCl 10 MG CAPS Take 1 capsule (10 mg total) by mouth daily. 30 capsule 3  . Cholecalciferol (VITAMIN D3) 1000 UNITS CAPS Take 1 capsule by mouth 2 (two) times daily.      . cyclobenzaprine (FLEXERIL) 5 MG tablet Take 1-2 tablets (5-10 mg total) by mouth 2 (two) times daily as needed for muscle spasms. 40 tablet 0  . esomeprazole (NEXIUM) 40 MG capsule Take 1 capsule (40 mg total)  by mouth daily. 30 capsule 3  . furosemide (LASIX) 40 MG tablet TAKE 1 TABLET BY MOUTH TWO  TIMES DAILY 180 tablet 1  . hydrALAZINE (APRESOLINE) 25 MG tablet Take 1 tablet (25 mg total) by mouth 2 (two) times daily. 60 tablet 1  . hydrOXYzine (ATARAX/VISTARIL) 25 MG tablet Take 1 tablet (25 mg total) by mouth 2 (two) times daily as needed.    . isosorbide mononitrate (IMDUR) 30 MG 24 hr tablet Take 30 mg by mouth 2 (two) times daily.    . meclizine (ANTIVERT) 25 MG tablet Take 1 tablet (25 mg total) by mouth 3 (three) times daily as needed. 30 tablet 1  . sevelamer carbonate (RENVELA) 800 MG tablet Take 800 mg by mouth 3 (three) times daily with meals.    . sodium bicarbonate 650 MG tablet Take 1 tablet (650 mg total) by mouth 3 (three) times daily.    . sucralfate  (CARAFATE) 1 g tablet Take 1 tablet (1 g total) by mouth daily. 30 tablet 0  . traZODone (DESYREL) 50 MG tablet Take 0.5-1 tablets (25-50 mg total) by mouth at bedtime as needed. 30 tablet 3  . vitamin E 100 UNIT capsule Take 400 Units by mouth daily.     Marland Kitchen docusate sodium (COLACE) 100 MG capsule Take 1 capsule (100 mg total) by mouth 2 (two) times daily.  0  . mycophenolate (MYFORTIC) 180 MG EC tablet Take 2 tablets (360 mg total) by mouth 2 (two) times daily.    . tacrolimus (PROGRAF) 0.5 MG capsule 2 tabs in am and 1 at night     No facility-administered medications prior to visit.      Per HPI unless specifically indicated in ROS section below Review of Systems     Objective:    BP 130/70 (BP Location: Right Arm, Patient Position: Sitting, Cuff Size: Normal)   Pulse 66   Temp 98.3 F (36.8 C) (Oral)   Ht _0  (1.6 m)   Wt 144 lb 8 oz (65.5 kg)   LMP  (LMP Unknown)   SpO2 96%   BMI 25.60 kg/m   Wt Readings from Last 3 Encounters:  07/02/18 144 lb 8 oz (65.5 kg)  07/04/17 132 lb 8 oz (60.1 kg)  06/19/17 132 lb 8 oz (60.1 kg)    Physical Exam  Constitutional: She appears well-developed and well-nourished. No distress.  HENT:  Mouth/Throat: Oropharynx is clear and moist. No oropharyngeal exudate.  Cardiovascular: Normal rate, regular rhythm and normal heart sounds.  No murmur heard. Pulmonary/Chest: Effort normal and breath sounds normal. No respiratory distress. She has no wheezes. She has no rales.  Abdominal: Soft. Bowel sounds are normal. She exhibits no distension and no mass. There is no tenderness. There is no rebound and no guarding. No hernia.  Musculoskeletal: She exhibits no edema.  Psychiatric: She has a normal mood and affect.  Nursing note and vitals reviewed.  Results for orders placed or performed in visit on 04/11/18  HM MAMMOGRAPHY  Result Value Ref Range   HM Mammogram 0-4 Bi-Rad 0-4 Bi-Rad, Self Reported Normal      Assessment & Plan:   Problem  List Items Addressed This Visit    GERD (gastroesophageal reflux disease) - Primary    Anticipate uncomplicated GERD. rec trial pepcid 56m daily OTC. If no improvement with this, Rx for protonix 225mprinted for patient - suggested she run it by kidney transplant team (seeing tomorrow). Update with effect. Pt agrees with plan.  She was previously treated with nexium.       Relevant Medications   sodium bicarbonate 650 MG tablet   docusate sodium (COLACE) 100 MG capsule   famotidine (PEPCID) 20 MG tablet   pantoprazole (PROTONIX) 20 MG tablet    Other Visit Diagnoses    Need for influenza vaccination       Relevant Orders   Flu Vaccine QUAD 36+ mos IM (Completed)       Meds ordered this encounter  Medications  . Melatonin 3 MG CAPS    Sig: Take 1-2 capsules (3-6 mg total) by mouth at bedtime as needed (sleep).    Refill:  0  . famotidine (PEPCID) 20 MG tablet    Sig: Take 1 tablet (20 mg total) by mouth 2 (two) times daily.  . pantoprazole (PROTONIX) 20 MG tablet    Sig: Take 1 tablet (20 mg total) by mouth daily.    Dispense:  30 tablet    Refill:  1   Orders Placed This Encounter  Procedures  . Flu Vaccine QUAD 36+ mos IM    Follow up plan: No follow-ups on file.  Ria Bush, MD

## 2018-07-25 ENCOUNTER — Other Ambulatory Visit: Payer: Self-pay | Admitting: Family Medicine

## 2018-10-03 ENCOUNTER — Other Ambulatory Visit: Payer: Self-pay | Admitting: Family Medicine

## 2018-10-30 ENCOUNTER — Telehealth: Payer: Self-pay | Admitting: Family Medicine

## 2018-10-31 NOTE — Telephone Encounter (Signed)
E-scribed pravastatin refill.  Please schedule annual wellness visit.

## 2018-11-05 NOTE — Telephone Encounter (Signed)
Tried calling pt no answer

## 2018-11-13 NOTE — Telephone Encounter (Signed)
Noted  

## 2018-11-13 NOTE — Telephone Encounter (Signed)
Medicare wellness 3/18 cpx 3/25

## 2018-12-18 ENCOUNTER — Other Ambulatory Visit (INDEPENDENT_AMBULATORY_CARE_PROVIDER_SITE_OTHER): Payer: Medicare Other

## 2018-12-18 ENCOUNTER — Ambulatory Visit: Payer: Medicare Other

## 2018-12-18 ENCOUNTER — Other Ambulatory Visit: Payer: Self-pay

## 2018-12-18 ENCOUNTER — Other Ambulatory Visit: Payer: Self-pay | Admitting: Family Medicine

## 2018-12-18 DIAGNOSIS — E1121 Type 2 diabetes mellitus with diabetic nephropathy: Secondary | ICD-10-CM

## 2018-12-18 DIAGNOSIS — Z940 Kidney transplant status: Secondary | ICD-10-CM | POA: Diagnosis not present

## 2018-12-18 DIAGNOSIS — E785 Hyperlipidemia, unspecified: Secondary | ICD-10-CM

## 2018-12-18 DIAGNOSIS — I10 Essential (primary) hypertension: Secondary | ICD-10-CM

## 2018-12-18 DIAGNOSIS — E559 Vitamin D deficiency, unspecified: Secondary | ICD-10-CM

## 2018-12-18 LAB — CBC WITH DIFFERENTIAL/PLATELET
BASOS ABS: 0 10*3/uL (ref 0.0–0.1)
Basophils Relative: 0.5 % (ref 0.0–3.0)
EOS ABS: 0.2 10*3/uL (ref 0.0–0.7)
Eosinophils Relative: 3.5 % (ref 0.0–5.0)
HCT: 40 % (ref 36.0–46.0)
Hemoglobin: 13.2 g/dL (ref 12.0–15.0)
LYMPHS ABS: 0.8 10*3/uL (ref 0.7–4.0)
Lymphocytes Relative: 16.7 % (ref 12.0–46.0)
MCHC: 32.9 g/dL (ref 30.0–36.0)
MCV: 84.1 fl (ref 78.0–100.0)
MONO ABS: 0.6 10*3/uL (ref 0.1–1.0)
MONOS PCT: 12.1 % — AB (ref 3.0–12.0)
NEUTROS PCT: 67.2 % (ref 43.0–77.0)
Neutro Abs: 3.3 10*3/uL (ref 1.4–7.7)
Platelets: 211 10*3/uL (ref 150.0–400.0)
RBC: 4.76 Mil/uL (ref 3.87–5.11)
RDW: 13.7 % (ref 11.5–15.5)
WBC: 4.9 10*3/uL (ref 4.0–10.5)

## 2018-12-18 LAB — COMPREHENSIVE METABOLIC PANEL
ALT: 15 U/L (ref 0–35)
AST: 14 U/L (ref 0–37)
Albumin: 4.4 g/dL (ref 3.5–5.2)
Alkaline Phosphatase: 94 U/L (ref 39–117)
BILIRUBIN TOTAL: 0.6 mg/dL (ref 0.2–1.2)
BUN: 25 mg/dL — ABNORMAL HIGH (ref 6–23)
CALCIUM: 10.1 mg/dL (ref 8.4–10.5)
CO2: 25 mEq/L (ref 19–32)
CREATININE: 1.17 mg/dL (ref 0.40–1.20)
Chloride: 106 mEq/L (ref 96–112)
GFR: 55.94 mL/min — AB (ref >60.00)
GLUCOSE: 135 mg/dL — AB (ref 70–99)
Potassium: 4.5 mEq/L (ref 3.5–5.1)
Sodium: 139 mEq/L (ref 135–145)
Total Protein: 7.1 g/dL (ref 6.0–8.3)

## 2018-12-18 LAB — VITAMIN D 25 HYDROXY (VIT D DEFICIENCY, FRACTURES): VITD: 41.79 ng/mL (ref 30.00–100.00)

## 2018-12-18 LAB — LIPID PANEL
Cholesterol: 165 mg/dL (ref 0–200)
HDL: 48.3 mg/dL (ref >39.00)
LDL Cholesterol: 87 mg/dL (ref 0–99)
NonHDL: 116.22
TRIGLYCERIDES: 148 mg/dL (ref 0.0–149.0)
Total CHOL/HDL Ratio: 3
VLDL: 29.6 mg/dL (ref 0.0–40.0)

## 2018-12-18 LAB — HEMOGLOBIN A1C: HEMOGLOBIN A1C: 6.7 % — AB (ref 4.6–6.5)

## 2018-12-23 ENCOUNTER — Telehealth: Payer: Self-pay

## 2018-12-23 NOTE — Telephone Encounter (Signed)
Called patient on home number and it just kept ringing-could not leave a message. Called mobile number and voicemail is not set up. Will try again later.

## 2018-12-25 ENCOUNTER — Encounter: Payer: Medicare Other | Admitting: Family Medicine

## 2019-01-12 ENCOUNTER — Other Ambulatory Visit: Payer: Self-pay | Admitting: Family Medicine

## 2019-02-12 ENCOUNTER — Encounter: Payer: Self-pay | Admitting: Gastroenterology

## 2019-02-18 ENCOUNTER — Encounter: Payer: Self-pay | Admitting: Gastroenterology

## 2019-03-03 HISTORY — PX: COLONOSCOPY: SHX174

## 2019-03-11 ENCOUNTER — Other Ambulatory Visit: Payer: Self-pay

## 2019-03-11 ENCOUNTER — Ambulatory Visit (AMBULATORY_SURGERY_CENTER): Payer: Self-pay

## 2019-03-11 VITALS — Ht 63.0 in | Wt 147.0 lb

## 2019-03-11 DIAGNOSIS — Z1211 Encounter for screening for malignant neoplasm of colon: Secondary | ICD-10-CM

## 2019-03-11 MED ORDER — PEG 3350-KCL-NA BICARB-NACL 420 G PO SOLR: 4000.0000 mL | Freq: Once | ORAL | 0 refills | Status: AC

## 2019-03-11 NOTE — Progress Notes (Signed)
Denies allergies to eggs or soy products. Denies complication of anesthesia or sedation. Denies use of weight loss medication. Denies use of O2.   Emmi instructions given for colonoscopy.  Pre-Visit was conducted by phone due to Covid 19. Instructions were reviewed and mailed to confirmed home address. Patient was encouraged to call if she had any questions or concerns regarding instructions.

## 2019-03-20 ENCOUNTER — Other Ambulatory Visit (HOSPITAL_COMMUNITY)
Admission: RE | Admit: 2019-03-20 | Discharge: 2019-03-20 | Disposition: A | Discharge status: Home / Self Care | Payer: Medicare Other | Source: Ambulatory Visit | Attending: Family Medicine | Admitting: Family Medicine

## 2019-03-20 ENCOUNTER — Encounter: Payer: Self-pay | Admitting: Family Medicine

## 2019-03-20 ENCOUNTER — Ambulatory Visit (INDEPENDENT_AMBULATORY_CARE_PROVIDER_SITE_OTHER): Payer: Medicare Other | Admitting: Family Medicine

## 2019-03-20 ENCOUNTER — Other Ambulatory Visit: Payer: Self-pay

## 2019-03-20 VITALS — BP 124/80 | HR 62 | Temp 97.9°F | Ht 62.25 in | Wt 145.4 lb

## 2019-03-20 DIAGNOSIS — Z0000 Encounter for general adult medical examination without abnormal findings: Secondary | ICD-10-CM

## 2019-03-20 DIAGNOSIS — R413 Other amnesia: Secondary | ICD-10-CM

## 2019-03-20 DIAGNOSIS — Z01411 Encounter for gynecological examination (general) (routine) with abnormal findings: Secondary | ICD-10-CM | POA: Diagnosis not present

## 2019-03-20 DIAGNOSIS — R87610 Atypical squamous cells of undetermined significance on cytologic smear of cervix (ASC-US): Secondary | ICD-10-CM | POA: Diagnosis not present

## 2019-03-20 DIAGNOSIS — Z1151 Encounter for screening for human papillomavirus (HPV): Secondary | ICD-10-CM | POA: Insufficient documentation

## 2019-03-20 DIAGNOSIS — Z23 Encounter for immunization: Secondary | ICD-10-CM | POA: Diagnosis not present

## 2019-03-20 DIAGNOSIS — Z7189 Other specified counseling: Secondary | ICD-10-CM | POA: Diagnosis not present

## 2019-03-20 DIAGNOSIS — Z780 Asymptomatic menopausal state: Secondary | ICD-10-CM | POA: Diagnosis not present

## 2019-03-20 DIAGNOSIS — Z940 Kidney transplant status: Secondary | ICD-10-CM

## 2019-03-20 DIAGNOSIS — E1121 Type 2 diabetes mellitus with diabetic nephropathy: Secondary | ICD-10-CM | POA: Diagnosis not present

## 2019-03-20 DIAGNOSIS — H401133 Primary open-angle glaucoma, bilateral, severe stage: Secondary | ICD-10-CM

## 2019-03-20 DIAGNOSIS — E2839 Other primary ovarian failure: Secondary | ICD-10-CM

## 2019-03-20 DIAGNOSIS — K219 Gastro-esophageal reflux disease without esophagitis: Secondary | ICD-10-CM

## 2019-03-20 DIAGNOSIS — Z1231 Encounter for screening mammogram for malignant neoplasm of breast: Secondary | ICD-10-CM

## 2019-03-20 DIAGNOSIS — G3184 Mild cognitive impairment, so stated: Secondary | ICD-10-CM | POA: Insufficient documentation

## 2019-03-20 DIAGNOSIS — I10 Essential (primary) hypertension: Secondary | ICD-10-CM

## 2019-03-20 DIAGNOSIS — Z01419 Encounter for gynecological examination (general) (routine) without abnormal findings: Secondary | ICD-10-CM | POA: Diagnosis not present

## 2019-03-20 DIAGNOSIS — E785 Hyperlipidemia, unspecified: Secondary | ICD-10-CM

## 2019-03-20 DIAGNOSIS — E559 Vitamin D deficiency, unspecified: Secondary | ICD-10-CM

## 2019-03-20 LAB — MICROALBUMIN / CREATININE URINE RATIO
Creatinine,U: 137.3 mg/dL
Microalb Creat Ratio: 2.7 mg/g (ref 0.0–30.0)
Microalb, Ur: 3.7 mg/dL — ABNORMAL HIGH (ref 0.0–1.9)

## 2019-03-20 NOTE — Progress Notes (Signed)
This visit was conducted in person.  BP 124/80 (BP Location: Left Arm, Patient Position: Sitting, Cuff Size: Normal)   Pulse 62   Temp 97.9 F (36.6 C) (Tympanic)   Ht 5' 2.25" (1.581 m)   Wt 145 lb 6 oz (65.9 kg)   LMP  (LMP Unknown)   SpO2 99%   BMI 26.38 kg/m    CC: AMW, initial  Subjective:    Patient ID: Kristen Kirk, female    DOB: 03-Feb-1951, 68 y.o.   MRN: [id number]  HPI: Kristen Kirk is a 68 y.o. female presenting on 03/20/2019 for Medicare Wellness   Did not see Katha Cabal this year.   Hearing Screening   '125Hz'  '250Hz'  '500Hz'  '1000Hz'  '2000Hz'  '3000Hz'  '4000Hz'  '6000Hz'  '8000Hz'   Right ear:   40 0 25  40    Left ear:   '20 20 20  ' 40      Visual Acuity Screening   Right eye Left eye Both eyes  Without correction:     With correction: '20/25 20/25 20/25 '  Denies falls, denies depression  Kidney transplant 04/2017. On prograf and myfortic. Goal BP <130/80.   Noticing L lateral thigh pain noticeable with ambulation.   Preventative: Colonoscopy - normal 2010 Olevia Perches).rpt 10 yrs. Upcoming colonoscopy next week.  Pap normal 07/2011, 08/2014 WNL. Rpt today.  Mammogram 04/2018 Birads1.  DEXA - due for this Lung cancer screen - quit smoking ~1987 Flu shot yearly.  Tetanus 2009.  Pneumovax 2013. prevnar today Shingrix - 07/2018, due for second [facility]) Advanced directive - does not have this yet. Packet provided today. HCPOA would be husband and sister.  Seat belt use discussed  Sunscreen use discussed. 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 Interim medical history since our last visit reviewed. Allergies and medications reviewed and updated. Outpatient Medications Prior to Visit  Medication Sig Dispense Refill  . aspirin 81 MG tablet Take 81 mg by mouth daily.    . Blood Glucose Monitoring Suppl (ONE TOUCH ULTRA SYSTEM KIT) W/DEVICE KIT 1 kit by Does not apply route once. 1 each 0  . carvedilol (COREG) 25 MG tablet Take 1.5 tablets (37.5 mg total) by mouth 2 (two) times daily with a meal.    . Cholecalciferol (VITAMIN D3) 1000 units CAPS Take 2 capsules (2,000 Units total) by mouth daily. 60 capsule   . docusate sodium (COLACE) 100 MG capsule Take 1 capsule (100 mg total) by mouth 2 (two) times daily.  0  . dorzolamide-timolol (COSOPT) 22.3-6.8 MG/ML ophthalmic solution Place 1 drop into both eyes 3 (three) times daily.      . famotidine (PEPCID) 20 MG tablet Take 1 tablet (20 mg total) by mouth 2 (two) times daily.    Marland Kitchen glucose blood test strip Check sugars once daily 100 each 11  . Melatonin 3 MG CAPS Take 1-2 capsules (3-6 mg total) by mouth at bedtime as needed (sleep).  0  . Multiple Vitamin (MULTIVITAMIN) tablet Take 1 tablet by mouth daily.      . mycophenolate (MYFORTIC) 180 MG EC  tablet Take 2 tablets (360 mg total) by mouth 2 (two) times daily.    . pantoprazole (PROTONIX) 20 MG tablet TAKE 1 TABLET BY MOUTH ONCE DAILY 30 tablet 5  . polyethylene glycol (MIRALAX / GLYCOLAX) packet Take 17 g by mouth daily as needed for moderate constipation. 14 each 0  . pravastatin (PRAVACHOL) 80 MG tablet TAKE 1 TABLET BY MOUTH  EVERY EVENING 90 tablet 0  . sodium bicarbonate 650 MG tablet Take 1 tablet (650 mg total) by mouth 2 (two) times daily.    . tacrolimus (PROGRAF) 0.5 MG capsule 2 tabs in am and 1 at night    . travoprost, benzalkonium, (TRAVATAN) 0.004 % ophthalmic solution Place 1 drop into both eyes at bedtime.       No facility-administered medications prior to visit.      Per HPI unless specifically  indicated in ROS section below Review of Systems Objective:    BP 124/80 (BP Location: Left Arm, Patient Position: Sitting, Cuff Size: Normal)   Pulse 62   Temp 97.9 F (36.6 C) (Tympanic)   Ht 5' 2.25" (1.581 m)   Wt 145 lb 6 oz (65.9 kg)   LMP  (LMP Unknown)   SpO2 99%   BMI 26.38 kg/m   Wt Readings from Last 3 Encounters:  03/20/19 145 lb 6 oz (65.9 kg)  03/11/19 147 lb (66.7 kg)  07/02/18 144 lb 8 oz (65.5 kg)    Physical Exam Vitals signs and nursing note reviewed. Exam conducted with a chaperone present.  Constitutional:      General: She is not in acute distress.    Appearance: Normal appearance. She is well-developed.  HENT:     Head: Normocephalic and atraumatic.     Right Ear: Hearing, tympanic membrane, ear canal and external ear normal.     Left Ear: Hearing, tympanic membrane, ear canal and external ear normal.     Mouth/Throat:     Mouth: Mucous membranes are moist.     Pharynx: Uvula midline. No oropharyngeal exudate or posterior oropharyngeal erythema.  Eyes:     General: No scleral icterus.    Extraocular Movements: Extraocular movements intact.     Conjunctiva/sclera: Conjunctivae normal.     Pupils: Pupils are equal, round, and reactive to light.  Neck:     Musculoskeletal: Normal range of motion and neck supple.     Vascular: No carotid bruit.  Cardiovascular:     Rate and Rhythm: Normal rate and regular rhythm.     Pulses: Normal pulses.          Radial pulses are 2+ on the right side and 2+ on the left side.     Heart sounds: Normal heart sounds. No murmur.  Pulmonary:     Effort: Pulmonary effort is normal. No respiratory distress.     Breath sounds: Normal breath sounds. No wheezing, rhonchi or rales.  Abdominal:     General: Abdomen is flat. Bowel sounds are normal. There is no distension.     Palpations: Abdomen is soft. There is no mass.     Tenderness: There is no abdominal tenderness. There is no guarding or rebound.     Hernia: No  hernia is present.  Genitourinary:    General: Normal vulva.     Exam position: Supine.     Labia:        Right: No rash.        Left: No rash.      Urethra: No  prolapse.     Vagina: Normal.     Cervix: Normal.     Uterus: Normal.      Adnexa: Right adnexa normal and left adnexa normal.  Musculoskeletal: Normal range of motion.  Lymphadenopathy:     Cervical: No cervical adenopathy.  Skin:    General: Skin is warm and dry.     Findings: No rash.  Neurological:     General: No focal deficit present.     Mental Status: She is alert and oriented to person, place, and time.     Comments:  CN grossly intact, station and gait intact Recall 0/3 Calculation 3/5 serial 3s Unable to recall name of president  Psychiatric:        Mood and Affect: Mood normal.        Behavior: Behavior normal.        Thought Content: Thought content normal.        Judgment: Judgment normal.       Results for orders placed or performed in visit on 12/18/18  CBC with Differential/Platelet  Result Value Ref Range   WBC 4.9 4.0 - 10.5 K/uL   RBC 4.76 3.87 - 5.11 Mil/uL   Hemoglobin 13.2 12.0 - 15.0 g/dL   HCT 40.0 36.0 - 46.0 %   MCV 84.1 78.0 - 100.0 fl   MCHC 32.9 30.0 - 36.0 g/dL   RDW 13.7 11.5 - 15.5 %   Platelets 211.0 150.0 - 400.0 K/uL   Neutrophils Relative % 67.2 43.0 - 77.0 %   Lymphocytes Relative 16.7 12.0 - 46.0 %   Monocytes Relative 12.1 (H) 3.0 - 12.0 %   Eosinophils Relative 3.5 0.0 - 5.0 %   Basophils Relative 0.5 0.0 - 3.0 %   Neutro Abs 3.3 1.4 - 7.7 K/uL   Lymphs Abs 0.8 0.7 - 4.0 K/uL   Monocytes Absolute 0.6 0.1 - 1.0 K/uL   Eosinophils Absolute 0.2 0.0 - 0.7 K/uL   Basophils Absolute 0.0 0.0 - 0.1 K/uL  Hemoglobin A1c  Result Value Ref Range   Hgb A1c MFr Bld 6.7 (H) 4.6 - 6.5 %  Lipid panel  Result Value Ref Range   Cholesterol 165 0 - 200 mg/dL   Triglycerides 148.0 0.0 - 149.0 mg/dL   HDL 48.30 >39.00 mg/dL   VLDL 29.6 0.0 - 40.0 mg/dL   LDL Cholesterol 87 0  - 99 mg/dL   Total CHOL/HDL Ratio 3    NonHDL 116.22   Comprehensive metabolic panel  Result Value Ref Range   Sodium 139 135 - 145 mEq/L   Potassium 4.5 3.5 - 5.1 mEq/L   Chloride 106 96 - 112 mEq/L   CO2 25 19 - 32 mEq/L   Glucose, Bld 135 (H) 70 - 99 mg/dL   BUN 25 (H) 6 - 23 mg/dL   Creatinine, Ser 1.17 0.40 - 1.20 mg/dL   Total Bilirubin 0.6 0.2 - 1.2 mg/dL   Alkaline Phosphatase 94 39 - 117 U/L   AST 14 0 - 37 U/L   ALT 15 0 - 35 U/L   Total Protein 7.1 6.0 - 8.3 g/dL   Albumin 4.4 3.5 - 5.2 g/dL   Calcium 10.1 8.4 - 10.5 mg/dL   GFR 55.94 (L) >60.00 mL/min  VITAMIN D 25 Hydroxy (Vit-D Deficiency, Fractures)  Result Value Ref Range   VITD 41.79 30.00 - 100.00 ng/mL   Assessment & Plan:  Will order mammo/DEXA at [facility] Problem List Items Addressed This Visit  Vitamin D deficiency    Continues vitamin D replacement.       Primary open angle glaucoma (POAG) of both eyes, severe stage    Appreciate ophtho care.       Memory impairment    Some trouble noted with memory testing today. I did ask patient to return in 1 month for more detailed geriatric assessment.       Medicare annual wellness visit, initial - Primary    I have personally reviewed the Medicare Annual Wellness questionnaire and have noted 1. The patient's medical and social history 2. Their use of alcohol, tobacco or illicit drugs 3. Their current medications and supplements 4. The patient's functional ability including ADL's, fall risks, home safety risks and hearing or visual impairment. Cognitive function has been assessed and addressed as indicated.  5. Diet and physical activity 6. Evidence for depression or mood disorders The patients weight, height, BMI have been recorded in the chart. I have made referrals, counseling and provided education to the patient based on review of the above and I have provided the pt with a written personalized care plan for preventive services. Provider list  updated.. See scanned questionairre as needed for further documentation. Reviewed preventative protocols and updated unless pt declined.       Kidney transplant status    Regularly sees kidney transplant team.      HTN (hypertension)    Chronic, stable - continue carvedilol.      HLD (hyperlipidemia)    Chronic, stable on pravastatin.       GERD (gastroesophageal reflux disease)    Continue pepcid, protonix.       Controlled type 2 diabetes mellitus with diabetic nephropathy (HCC)    Chronic, stable off meds. Continue to monitor.       Relevant Orders   Microalbumin / creatinine urine ratio   Advanced care planning/counseling discussion    Advanced directive - does not have this yet. Packet provided today. HCPOA would be husband and sister.        Other Visit Diagnoses    Need for vaccination with 13-polyvalent pneumococcal conjugate vaccine       Relevant Orders   Pneumococcal conjugate vaccine 13-valent IM (Completed)   Well female exam with routine gynecological exam       Relevant Orders   Cytology - PAP   Estrogen deficiency       Relevant Orders   DG Bone Density   Screening mammogram, encounter for       Relevant Orders   MM Digital Screening       No orders of the defined types were placed in this encounter.  Orders Placed This Encounter  Procedures  . DG Bone Density    Standing Status:   Future    Standing Expiration Date:   05/19/2020    Order Specific Question:   Reason for Exam (SYMPTOM  OR DIAGNOSIS REQUIRED)    Answer:   osteoporosis screen    Order Specific Question:   Preferred imaging location?    Answer:   [facility]  . MM Digital Screening    Standing Status:   Future    Standing Expiration Date:   05/19/2020    Order Specific Question:   Reason for Exam (SYMPTOM  OR DIAGNOSIS REQUIRED)    Answer:   breast cancer screening    Order Specific Question:   Preferred imaging location?    Answer:   [facility]  . Pneumococcal  conjugate vaccine  13-valent IM  . Microalbumin / creatinine urine ratio    Follow up plan: Return in about 4 weeks (around 04/17/2019) for follow up visit.  Ria Bush, MD

## 2019-03-20 NOTE — Assessment & Plan Note (Signed)

## 2019-03-20 NOTE — Assessment & Plan Note (Signed)
Chronic, stable on pravastatin.

## 2019-03-20 NOTE — Assessment & Plan Note (Signed)
Continue pepcid, protonix.

## 2019-03-20 NOTE — Assessment & Plan Note (Addendum)
Chronic, stable off meds.  Continue to monitor. 

## 2019-03-20 NOTE — Assessment & Plan Note (Signed)
Some trouble noted with memory testing today. I did ask patient to return in 1 month for more detailed geriatric assessment.

## 2019-03-20 NOTE — Assessment & Plan Note (Signed)
Chronic, stable - continue carvedilol.

## 2019-03-20 NOTE — Assessment & Plan Note (Signed)
Advanced directive - does not have this yet. Packet provided today. HCPOA would be husband and sister.

## 2019-03-20 NOTE — Assessment & Plan Note (Signed)
Regularly sees kidney transplant team.

## 2019-03-20 NOTE — Patient Instructions (Addendum)
Well woman exam today. If normal, we may stop screening You will be due for mammogram next month Check with [facility] about second shingles shot (Shingrix) Return as needed or in 1 month for further memory testing. Good to see you today, call us with questions.  Health Maintenance After Age 67 After age 35, you are at a higher risk for certain long-term diseases and infections as well as injuries from falls. Falls are a major cause of broken bones and head injuries in people who are older than age 46. Getting regular preventive care can help to keep you healthy and well. Preventive care includes getting regular testing and making lifestyle changes as recommended by your health care provider. Talk with your health care provider about:  Which screenings and tests you should have. A screening is a test that checks for a disease when you have no symptoms.  A diet and exercise plan that is right for you. What should I know about screenings and tests to prevent falls? Screening and testing are the best ways to find a health problem early. Early diagnosis and treatment give you the best chance of managing medical conditions that are common after age 77. Certain conditions and lifestyle choices may make you more likely to have a fall. Your health care provider may recommend:  Regular vision checks. Poor vision and conditions such as cataracts can make you more likely to have a fall. If you wear glasses, make sure to get your prescription updated if your vision changes.  Medicine review. Work with your health care provider to regularly review all of the medicines you are taking, including over-the-counter medicines. Ask your health care provider about any side effects that may make you more likely to have a fall. Tell your health care provider if any medicines that you take make you feel dizzy or sleepy.  Osteoporosis screening. Osteoporosis is a condition that causes the bones to get weaker. This can make the  bones weak and cause them to break more easily.  Blood pressure screening. Blood pressure changes and medicines to control blood pressure can make you feel dizzy.  Strength and balance checks. Your health care provider may recommend certain tests to check your strength and balance while standing, walking, or changing positions.  Foot health exam. Foot pain and numbness, as well as not wearing proper footwear, can make you more likely to have a fall.  Depression screening. You may be more likely to have a fall if you have a fear of falling, feel emotionally low, or feel unable to do activities that you used to do.  Alcohol use screening. Using too much alcohol can affect your balance and may make you more likely to have a fall. What actions can I take to lower my risk of falls? General instructions  Talk with your health care provider about your risks for falling. Tell your health care provider if: ? You fall. Be sure to tell your health care provider about all falls, even ones that seem minor. ? You feel dizzy, sleepy, or off-balance.  Take over-the-counter and prescription medicines only as told by your health care provider. These include any supplements.  Eat a healthy diet and maintain a healthy weight. A healthy diet includes low-fat dairy products, low-fat (lean) meats, and fiber from whole grains, beans, and lots of fruits and vegetables. Home safety  Remove any tripping hazards, such as rugs, cords, and clutter.  Install safety equipment such as grab bars in bathrooms and safety  rails on stairs.  Keep rooms and walkways well-lit. Activity   Follow a regular exercise program to stay fit. This will help you maintain your balance. Ask your health care provider what types of exercise are appropriate for you.  If you need a cane or walker, use it as recommended by your health care provider.  Wear supportive shoes that have nonskid soles. Lifestyle  Do not drink alcohol if your  health care provider tells you not to drink.  If you drink alcohol, limit how much you have: ? 0-1 drink a day for women. ? 0-2 drinks a day for men.  Be aware of how much alcohol is in your drink. In the [place] one drink equals one typical bottle of beer (12 oz), one-half glass of wine (5 oz), or one shot of hard liquor (1 oz).  Do not use any products that contain nicotine or tobacco, such as cigarettes and e-cigarettes. If you need help quitting, ask your health care provider. Summary  Having a healthy lifestyle and getting preventive care can help to protect your health and wellness after age 23.  Screening and testing are the best way to find a health problem early and help you avoid having a fall. Early diagnosis and treatment give you the best chance for managing medical conditions that are more common for people who are older than age 71.  Falls are a major cause of broken bones and head injuries in people who are older than age 41. Take precautions to prevent a fall at home.  Work with your health care provider to learn what changes you can make to improve your health and wellness and to prevent falls. This information is not intended to replace advice given to you by your health care provider. Make sure you discuss any questions you have with your health care provider. Document Released: 08/01/2017 Document Revised: 08/01/2017 Document Reviewed: 08/01/2017 Elsevier Interactive Patient Education  2019 [employer].

## 2019-03-20 NOTE — Assessment & Plan Note (Signed)
Appreciate ophtho care.  

## 2019-03-20 NOTE — Assessment & Plan Note (Signed)
Continues vitamin D replacement.

## 2019-03-24 ENCOUNTER — Telehealth: Payer: Self-pay | Admitting: Gastroenterology

## 2019-03-24 NOTE — Telephone Encounter (Signed)

## 2019-03-25 ENCOUNTER — Other Ambulatory Visit: Payer: Self-pay

## 2019-03-25 ENCOUNTER — Encounter

## 2019-03-25 ENCOUNTER — Encounter: Payer: Self-pay | Admitting: Gastroenterology

## 2019-03-25 ENCOUNTER — Ambulatory Visit (AMBULATORY_SURGERY_CENTER): Payer: Medicare Other | Admitting: Gastroenterology

## 2019-03-25 VITALS — BP 161/69 | HR 64 | Temp 98.9°F | Resp 16 | Ht 63.0 in | Wt 147.0 lb

## 2019-03-25 DIAGNOSIS — Z1211 Encounter for screening for malignant neoplasm of colon: Secondary | ICD-10-CM | POA: Diagnosis not present

## 2019-03-25 LAB — CYTOLOGY - PAP
Diagnosis: UNDETERMINED — AB
HPV: NOT DETECTED

## 2019-03-25 MED ORDER — SODIUM CHLORIDE 0.9 % IV SOLN: 500.0000 mL | Freq: Once | INTRAVENOUS | Status: DC

## 2019-03-25 NOTE — Progress Notes (Signed)
Covid screening and temp done by Riki Sheer, Vital signs done per Rica Mote

## 2019-03-25 NOTE — Progress Notes (Signed)
To PACU, VSS. Report to [email or web address] 

## 2019-03-25 NOTE — Op Note (Signed)
[place] Patient Name: Kristen Kirk Procedure Date: 03/25/2019 10:30 AM MRN: [id number] Endoscopist: Mauri Pole , MD Age: 68 Referring MD:  Date of Birth: Sep 11, 1951 Gender: Female Account #: [id number] Procedure:                Colonoscopy Indications:              Screening for colorectal malignant neoplasm Medicines:                Monitored Anesthesia Care Procedure:                Pre-Anesthesia Assessment:                           - Prior to the procedure, a History and Physical                            was performed, and patient medications and                            allergies were reviewed. The patient's tolerance of                            previous anesthesia was also reviewed. The risks                            and benefits of the procedure and the sedation                            options and risks were discussed with the patient.                            All questions were answered, and informed consent                            was obtained. Prior Anticoagulants: The patient has                            taken no previous anticoagulant or antiplatelet                            agents. ASA Grade Assessment: II - A patient with                            mild systemic disease. After reviewing the risks                            and benefits, the patient was deemed in                            satisfactory condition to undergo the procedure.                           After obtaining informed consent, the colonoscope  was passed under direct vision. Throughout the                            procedure, the patient's blood pressure, pulse, and                            oxygen saturations were monitored continuously. The                            Model PCF-H190DL [id number]) scope was introduced                            through the anus and advanced to the the cecum,   identified by appendiceal orifice and ileocecal                            valve. The colonoscopy was performed without                            difficulty. The patient tolerated the procedure                            well. The quality of the bowel preparation was                            excellent. The ileocecal valve, appendiceal                            orifice, and rectum were photographed. Scope In: 10:34:59 AM Scope Out: 10:51:12 AM Scope Withdrawal Time: 0 hours 10 minutes 17 seconds  Total Procedure Duration: 0 hours 16 minutes 13 seconds  Findings:                 The perianal and digital rectal examinations were                            normal.                           Multiple small-mouthed diverticula were found in                            the sigmoid colon and descending colon.                           Non-bleeding internal hemorrhoids were found during                            retroflexion. The hemorrhoids were small. Complications:            No immediate complications. Estimated Blood Loss:     Estimated blood loss was minimal. Impression:               - Diverticulosis in the sigmoid colon and in the  descending colon.                           - Non-bleeding internal hemorrhoids.                           - No specimens collected. Recommendation:           - Patient has a contact number available for                            emergencies. The signs and symptoms of potential                            delayed complications were discussed with the                            patient. Return to normal activities tomorrow.                            Written discharge instructions were provided to the                            patient.                           - Resume previous diet.                           - Continue present medications.                           - Repeat colonoscopy in 10 years for screening                             purposes. Mauri Pole, MD 03/25/2019 11:06:10 AM This report has been signed electronically.

## 2019-03-25 NOTE — Patient Instructions (Signed)
INFORMATION ON DIVERTICULOSIS & HEMORRHOIDS GIVEN TO YOU TODAY    YOU HAD AN ENDOSCOPIC PROCEDURE TODAY AT [facility]:   Refer to the procedure report that was given to you for any specific questions about what was found during the examination.  If the procedure report does not answer your questions, please call your gastroenterologist to clarify.  If you requested that your care partner not be given the details of your procedure findings, then the procedure report has been included in a sealed envelope for you to review at your convenience later.  YOU SHOULD EXPECT: Some feelings of bloating in the abdomen. Passage of more gas than usual.  Walking can help get rid of the air that was put into your GI tract during the procedure and reduce the bloating. If you had a lower endoscopy (such as a colonoscopy or flexible sigmoidoscopy) you may notice spotting of blood in your stool or on the toilet paper. If you underwent a bowel prep for your procedure, you may not have a normal bowel movement for a few days.  Please Note:  You might notice some irritation and congestion in your nose or some drainage.  This is from the oxygen used during your procedure.  There is no need for concern and it should clear up in a day or so.  SYMPTOMS TO REPORT IMMEDIATELY:   Following lower endoscopy (colonoscopy or flexible sigmoidoscopy):  Excessive amounts of blood in the stool  Significant tenderness or worsening of abdominal pains  Swelling of the abdomen that is new, acute  Fever of 100F or higher   Following upper endoscopy (EGD)  Vomiting of blood or coffee ground material  New chest pain or pain under the shoulder blades  Painful or persistently difficult swallowing  New shortness of breath  Fever of 100F or higher  Black, tarry-looking stools  For urgent or emergent issues, a gastroenterologist can be reached at any hour by calling [phone].   DIET:  We do recommend a small  meal at first, but then you may proceed to your regular diet.  Drink plenty of fluids but you should avoid alcoholic beverages for 24 hours.  ACTIVITY:  You should plan to take it easy for the rest of today and you should NOT DRIVE or use heavy machinery until tomorrow (because of the sedation medicines used during the test).    FOLLOW UP: Our staff will call the number listed on your records 48-72 hours following your procedure to check on you and address any questions or concerns that you may have regarding the information given to you following your procedure. If we do not reach you, we will leave a message.  We will attempt to reach you two times.  During this call, we will ask if you have developed any symptoms of COVID 19. If you develop any symptoms (ie: fever, flu-like symptoms, shortness of breath, cough etc.) before then, please call [phone].  If you test positive for Covid 19 in the 2 weeks post procedure, please call and report this information to [place].    If any biopsies were taken you will be contacted by phone or by letter within the next 1-3 weeks.  Please call us at [phone] if you have not heard about the biopsies in 3 weeks.    SIGNATURES/CONFIDENTIALITY: You and/or your care partner have signed paperwork which will be entered into your electronic medical record.  These signatures attest to the fact that that the  information above on your After Visit Summary has been reviewed and is understood.  Full responsibility of the confidentiality of this discharge information lies with you and/or your care-partner.

## 2019-03-27 ENCOUNTER — Telehealth: Payer: Self-pay | Admitting: *Deleted

## 2019-03-27 NOTE — Telephone Encounter (Signed)
  Follow up Call-  Call back number 03/25/2019  Post procedure Call Back phone  # [phone]  Permission to leave phone message Yes  Some recent data might be hidden     Patient questions:  Do you have a fever, pain , or abdominal swelling? No. Pain Score  0 *  Have you tolerated food without any problems? Yes.    Have you been able to return to your normal activities? Yes.    Do you have any questions about your discharge instructions: Diet   No. Medications  No. Follow up visit  No.  Do you have questions or concerns about your Care? No.  Actions: * If pain score is 4 or above: No action needed, pain <4.  1. Have you developed a fever since your procedure? NO  2.   Have you had an respiratory symptoms (SOB or cough) since your procedure? NO  3.   Have you tested positive for COVID 19 since your procedure NO  4.   Have you had any family members/close contacts diagnosed with the COVID 19 since your procedure?  NO   If yes to any of these questions please route to Joylene John, RN and Alphonsa Gin, RN.

## 2019-05-01 ENCOUNTER — Ambulatory Visit
Admission: RE | Admit: 2019-05-01 | Discharge: 2019-05-01 | Disposition: A | Discharge status: Home / Self Care | Payer: Medicare Other | Source: Ambulatory Visit | Attending: Family Medicine | Admitting: Family Medicine

## 2019-05-01 DIAGNOSIS — E2839 Other primary ovarian failure: Secondary | ICD-10-CM | POA: Diagnosis present

## 2019-05-03 ENCOUNTER — Encounter: Payer: Self-pay | Admitting: Family Medicine

## 2019-05-03 DIAGNOSIS — M810 Age-related osteoporosis without current pathological fracture: Secondary | ICD-10-CM

## 2019-05-03 HISTORY — DX: Age-related osteoporosis without current pathological fracture: M81.0

## 2019-05-14 ENCOUNTER — Encounter: Payer: Self-pay | Admitting: Family Medicine

## 2019-05-25 ENCOUNTER — Other Ambulatory Visit: Payer: Self-pay | Admitting: Family Medicine

## 2019-07-17 ENCOUNTER — Other Ambulatory Visit: Payer: Self-pay

## 2019-07-17 DIAGNOSIS — Z20822 Contact with and (suspected) exposure to covid-19: Secondary | ICD-10-CM

## 2019-07-18 LAB — NOVEL CORONAVIRUS, NAA: SARS-CoV-2, NAA: NOT DETECTED

## 2019-07-21 ENCOUNTER — Other Ambulatory Visit: Payer: Self-pay | Admitting: Family Medicine

## 2019-07-28 ENCOUNTER — Telehealth: Payer: Self-pay | Admitting: Family Medicine

## 2019-07-28 ENCOUNTER — Other Ambulatory Visit: Payer: Self-pay | Admitting: Family Medicine

## 2019-07-28 NOTE — Telephone Encounter (Signed)
° °  Pt rec neg COVID results °

## 2019-07-29 NOTE — Telephone Encounter (Signed)
Noted  

## 2019-07-29 NOTE — Telephone Encounter (Signed)
Pt called triage because she still hasn't receive her refill of her protonix. I advise pt it was refilled on 05/26/19 #90 with 2 refills but she said the pharmacy told her they don't have that refill. Called [facility] directly for pt and they did confirm that they don't have that refill on file so verbal refill given over the phone to pharmacy. I advise pt and she will go get her meds now

## 2019-10-08 ENCOUNTER — Encounter: Payer: Self-pay | Admitting: Family Medicine

## 2019-10-08 ENCOUNTER — Other Ambulatory Visit: Payer: Self-pay

## 2019-10-08 ENCOUNTER — Ambulatory Visit (INDEPENDENT_AMBULATORY_CARE_PROVIDER_SITE_OTHER): Payer: Medicare Other | Admitting: Family Medicine

## 2019-10-08 VITALS — BP 122/62 | HR 78 | Temp 97.6°F | Wt 146.7 lb

## 2019-10-08 DIAGNOSIS — M5432 Sciatica, left side: Secondary | ICD-10-CM | POA: Diagnosis not present

## 2019-10-08 DIAGNOSIS — M818 Other osteoporosis without current pathological fracture: Secondary | ICD-10-CM | POA: Diagnosis not present

## 2019-10-08 DIAGNOSIS — Z940 Kidney transplant status: Secondary | ICD-10-CM

## 2019-10-08 DIAGNOSIS — M545 Low back pain, unspecified: Secondary | ICD-10-CM | POA: Insufficient documentation

## 2019-10-08 DIAGNOSIS — G8929 Other chronic pain: Secondary | ICD-10-CM | POA: Insufficient documentation

## 2019-10-08 MED ORDER — PREDNISONE 10 MG PO TABS: ORAL_TABLET | ORAL | 0 refills | Status: DC

## 2019-10-08 NOTE — Patient Instructions (Addendum)
Leg pain suspicious for sciatica on left  Treat with prednisone course sent to pharmacy.  Do exercises provided today. May continue heating pad. Continue tylenol, may take 500-1000mg  with meals.  Update [place] with effect of above including prednisone course.   Sciatica  Sciatica is pain, numbness, weakness, or tingling along the path of the sciatic nerve. The sciatic nerve starts in the lower back and runs down the back of each leg. The nerve controls the muscles in the lower leg and in the back of the knee. It also provides feeling (sensation) to the back of the thigh, the lower leg, and the sole of the foot. Sciatica is a symptom of another medical condition that pinches or puts pressure on the sciatic nerve. Sciatica most often only affects one side of the body. Sciatica usually goes away on its own or with treatment. In some cases, sciatica may come back (recur). What are the causes? This condition is caused by pressure on the sciatic nerve or pinching of the nerve. This may be the result of:  A disk in between the bones of the spine bulging out too far (herniated disk).  Age-related changes in the spinal disks.  A pain disorder that affects a muscle in the buttock.  Extra bone growth near the sciatic nerve.  A break (fracture) of the pelvis.  Pregnancy.  Tumor. This is rare. What increases the risk? The following factors may make you more likely to develop this condition:  Playing sports that place pressure or stress on the spine.  Having poor strength and flexibility.  A history of back injury or surgery.  Sitting for long periods of time.  Doing activities that involve repetitive bending or lifting.  Obesity. What are the signs or symptoms? Symptoms can vary from mild to very severe, and they may include:  Any of these problems in the lower back, leg, hip, or buttock: ? Mild tingling, numbness, or dull aches. ? Burning sensations. ? Sharp pains.  Numbness in the  back of the calf or the sole of the foot.  Leg weakness.  Severe back pain that makes movement difficult. Symptoms may get worse when you cough, sneeze, or laugh, or when you sit or stand for long periods of time. How is this diagnosed? This condition may be diagnosed based on:  Your symptoms and medical history.  A physical exam.  Blood tests.  Imaging tests, such as: ? X-rays. ? MRI. ? CT scan. How is this treated? In many cases, this condition improves on its own without treatment. However, treatment may include:  Reducing or modifying physical activity.  Exercising and stretching.  Icing and applying heat to the affected area.  Medicines that help to: ? Relieve pain and swelling. ? Relax your muscles.  Injections of medicines that help to relieve pain, irritation, and inflammation around the sciatic nerve (steroids).  Surgery. Follow these instructions at home: Medicines  Take over-the-counter and prescription medicines only as told by your health care provider.  Ask your health care provider if the medicine prescribed to you: ? Requires you to avoid driving or using heavy machinery. ? Can cause constipation. You may need to take these actions to prevent or treat constipation:  Drink enough fluid to keep your urine pale yellow.  Take over-the-counter or prescription medicines.  Eat foods that are high in fiber, such as beans, whole grains, and fresh fruits and vegetables.  Limit foods that are high in fat and processed sugars, such as fried  or sweet foods. Managing pain      If directed, put ice on the affected area. ? Put ice in a plastic bag. ? Place a towel between your skin and the bag. ? Leave the ice on for 20 minutes, 2-3 times a day.  If directed, apply heat to the affected area. Use the heat source that your health care provider recommends, such as a moist heat pack or a heating pad. ? Place a towel between your skin and the heat  source. ? Leave the heat on for 20-30 minutes. ? Remove the heat if your skin turns bright red. This is especially important if you are unable to feel pain, heat, or cold. You may have a greater risk of getting burned. Activity   Return to your normal activities as told by your health care provider. Ask your health care provider what activities are safe for you.  Avoid activities that make your symptoms worse.  Take brief periods of rest throughout the day. ? When you rest for longer periods, mix in some mild activity or stretching between periods of rest. This will help to prevent stiffness and pain. ? Avoid sitting for long periods of time without moving. Get up and move around at least one time each hour.  Exercise and stretch regularly, as told by your health care provider.  Do not lift anything that is heavier than 10 lb (4.5 kg) while you have symptoms of sciatica. When you do not have symptoms, you should still avoid heavy lifting, especially repetitive heavy lifting.  When you lift objects, always use proper lifting technique, which includes: ? Bending your knees. ? Keeping the load close to your body. ? Avoiding twisting. General instructions  Maintain a healthy weight. Excess weight puts extra stress on your back.  Wear supportive, comfortable shoes. Avoid wearing high heels.  Avoid sleeping on a mattress that is too soft or too hard. A mattress that is firm enough to support your back when you sleep may help to reduce your pain.  Keep all follow-up visits as told by your health care provider. This is important. Contact a health care provider if:  You have pain that: ? Wakes you up when you are sleeping. ? Gets worse when you lie down. ? Is worse than you have experienced in the past. ? Lasts longer than 4 weeks.  You have an unexplained weight loss. Get help right away if:  You are not able to control when you urinate or have bowel movements (incontinence).  You  have: ? Weakness in your lower back, pelvis, buttocks, or legs that gets worse. ? Redness or swelling of your back. ? A burning sensation when you urinate. Summary  Sciatica is pain, numbness, weakness, or tingling along the path of the sciatic nerve.  This condition is caused by pressure on the sciatic nerve or pinching of the nerve.  Sciatica can cause pain, numbness, or tingling in the lower back, legs, hips, and buttocks.  Treatment often includes rest, exercise, medicines, and applying ice or heat. This information is not intended to replace advice given to you by your health care provider. Make sure you discuss any questions you have with your health care provider. Document Revised: 10/07/2018 Document Reviewed: 10/07/2018 Elsevier Patient Education  [place].

## 2019-10-08 NOTE — Assessment & Plan Note (Signed)
Planning to start fosamax through nephrology (hasn't started yet).

## 2019-10-08 NOTE — Assessment & Plan Note (Addendum)
No red flags. Hips move ok. No midline lumbar spine pain to suggest vertebral fracture Discussed with patient.  Conservative management with prednisone course, tylenol, heating pad, gentle stretching exercises provided today (HNP exercises).  Avoid NSAIDs with kidney history. Reviewed steroid precautions in diabetic.  Update if not improving with treatment for further evaluation, consider xray, PT. Pt agrees with plan.

## 2019-10-08 NOTE — Progress Notes (Signed)
This visit was conducted in person.  BP 122/62 (BP Location: Left Arm, Patient Position: Sitting, Cuff Size: Small)   Pulse 78   Temp 97.6 F (36.4 C) (Tympanic)   Wt 146 lb 11.2 oz (66.5 kg)   LMP  (LMP Unknown)   SpO2 98%   BMI 25.99 kg/m    CC: leg pain Subjective:    Patient ID: Kristen Kirk, female    DOB: Oct 26, 1950, 69 y.o.   MRN: [id number]  HPI: Kristen Kirk is a 69 y.o. female presenting on 10/08/2019 for Leg Pain   10d h/o L leg pain described as pulling pain from posterior hip down posterior leg.  Pain worse with standing and walking.  No pain when sitting or supine.  No groin pain.  No shooting pain past knee, no numbness/paresthesias, bowel/bladder accidents, fevers, lower back pain. Denies inciting trauma/injury or falls.  Treating with tylenol 579m up to TID PRN.   H/o ESRD s/p kidney transplant 04/2017.   Planning to start fosamax 744mweekly.     Relevant past medical, surgical, family and social history reviewed and updated as indicated. Interim medical history since our last visit reviewed. Allergies and medications reviewed and updated. Outpatient Medications Prior to Visit  Medication Sig Dispense Refill  . alendronate (FOSAMAX) 70 MG tablet Take 70 mg by mouth once a week. Take with a full glass of water on an empty stomach.    . Marland Kitchenspirin 81 MG tablet Take 81 mg by mouth daily.    . Blood Glucose Monitoring Suppl (ONE TOUCH ULTRA SYSTEM KIT) W/DEVICE KIT 1 kit by Does not apply route once. 1 each 0  . carvedilol (COREG) 25 MG tablet Take 1.5 tablets (37.5 mg total) by mouth 2 (two) times daily with a meal.    . Cholecalciferol (VITAMIN D3) 1000 units CAPS Take 2 capsules (2,000 Units total) by mouth daily. 60 capsule   . docusate sodium (COLACE) 100 MG capsule Take 1 capsule (100 mg total) by mouth 2 (two) times daily.  0  . dorzolamide-timolol (COSOPT) 22.3-6.8 MG/ML ophthalmic solution Place 1 drop into both eyes 3 (three) times daily.        . famotidine (PEPCID) 20 MG tablet Take 1 tablet (20 mg total) by mouth 2 (two) times daily.    . Marland Kitchenlucose blood test strip Check sugars once daily 100 each 11  . Melatonin 3 MG CAPS Take 1-2 capsules (3-6 mg total) by mouth at bedtime as needed (sleep).  0  . Multiple Vitamin (MULTIVITAMIN) tablet Take 1 tablet by mouth daily.      . mycophenolate (MYFORTIC) 180 MG EC tablet Take 2 tablets (360 mg total) by mouth 2 (two) times daily.    . pantoprazole (PROTONIX) 20 MG tablet TAKE 1 TABLET BY MOUTH EVERY DAY 90 tablet 1  . polyethylene glycol (MIRALAX / GLYCOLAX) packet Take 17 g by mouth daily as needed for moderate constipation. 14 each 0  . pravastatin (PRAVACHOL) 80 MG tablet TAKE 1 TABLET BY MOUTH  EVERY EVENING 90 tablet 0  . sodium bicarbonate 650 MG tablet Take 1 tablet (650 mg total) by mouth 2 (two) times daily.    . tacrolimus (PROGRAF) 0.5 MG capsule 2 tabs in am and 1 at night    . travoprost, benzalkonium, (TRAVATAN) 0.004 % ophthalmic solution Place 1 drop into both eyes at bedtime.       No facility-administered medications prior to visit.     Per HPI unless specifically  indicated in ROS section below Review of Systems Objective:    BP 122/62 (BP Location: Left Arm, Patient Position: Sitting, Cuff Size: Small)   Pulse 78   Temp 97.6 F (36.4 C) (Tympanic)   Wt 146 lb 11.2 oz (66.5 kg)   LMP  (LMP Unknown)   SpO2 98%   BMI 25.99 kg/m   Wt Readings from Last 3 Encounters:  10/08/19 146 lb 11.2 oz (66.5 kg)  03/25/19 147 lb (66.7 kg)  03/20/19 145 lb 6 oz (65.9 kg)    Physical Exam Vitals and nursing note reviewed.  Constitutional:      Appearance: Normal appearance. She is not ill-appearing.  Musculoskeletal:        General: Normal range of motion.     Right lower leg: No edema.     Left lower leg: No edema.     Comments:  No pain midline spine No paraspinous mm tenderness + left SLR bilaterally  No pain with int/ext rotation at hip Neg FABER No pain at  GTB or sciatic notch bilaterally.  Tender to palpation at L SIJ  Skin:    Findings: No rash.  Neurological:     General: No focal deficit present.     Mental Status: She is alert.     Comments: 5/5 strength BLE  Psychiatric:        Mood and Affect: Mood normal.        Behavior: Behavior normal.       Lab Results  Component Value Date   HGBA1C 6.7 (H) 12/18/2018  A1c 6.6 (05/08/2019) at [facility]:  This visit occurred during the SARS-CoV-2 public health emergency.  Safety protocols were in place, including screening questions prior to the visit, additional usage of staff PPE, and extensive cleaning of exam room while observing appropriate contact time as indicated for disinfecting solutions.   Problem List Items Addressed This Visit    Osteoporosis    Planning to start fosamax through nephrology (hasn't started yet).       Relevant Medications   alendronate (FOSAMAX) 70 MG tablet   Left sided sciatica    No red flags. Hips move ok. No midline lumbar spine pain to suggest vertebral fracture Discussed with patient.  Conservative management with prednisone course, tylenol, heating pad, gentle stretching exercises provided today (HNP exercises).  Avoid NSAIDs with kidney history. Reviewed steroid precautions in diabetic.  Update if not improving with treatment for further evaluation, consider xray, PT. Pt agrees with plan.       Kidney transplant status - Primary   Relevant Medications   predniSONE (DELTASONE) 10 MG tablet       Meds ordered this encounter  Medications  . predniSONE (DELTASONE) 10 MG tablet    Sig: Take three tablets for 3 days followed by two tablets for 3 days followed by one tablet for 3 days    Dispense:  18 tablet    Refill:  0   No orders of the defined types were placed in this encounter.   Patient instructions: Leg pain suspicious for sciatica on left  Treat with prednisone course sent to pharmacy.  Do exercises provided  today. May continue heating pad. Continue tylenol, may take 500-1044m with meals.  Update [place] of above including prednisone course.   Follow up plan: Return if symptoms worsen or fail to improve.  JRia Bush MD

## 2019-10-16 ENCOUNTER — Telehealth: Payer: Self-pay | Admitting: Family Medicine

## 2019-10-16 MED ORDER — TRAMADOL HCL 50 MG PO TABS: 25.0000 mg | ORAL_TABLET | Freq: Three times a day (TID) | ORAL | 0 refills | Status: AC | PRN

## 2019-10-16 NOTE — Telephone Encounter (Signed)
Patient called in regards to hip/leg pain  She stated that the medication she was prescribed for this she has run out of. She is not sure what she should do next. Patient said she is still having hip.leg pain and it does not seem to be improving .   Please advise

## 2019-10-16 NOTE — Telephone Encounter (Signed)
Agree with OV next week. Recommend max out tylenol - up to 1000mg  three times daily.  May add tramadol for pain - may take 1/2-1 tab bid prn. Sent to pharmacy. Caution with sedation on this synthetic opiate.  [place] CSRS reviewed

## 2019-10-16 NOTE — Telephone Encounter (Signed)
Patient called back She scheduled appointment for Monday but wanted to know what she should do until them with the pain. She said she didn't think she could make it until Monday without something to ease the pain

## 2019-10-16 NOTE — Telephone Encounter (Signed)
Spoke with pt relaying Dr. G's message.  Pt verbalizes understanding and expresses her thanks.  

## 2019-10-20 ENCOUNTER — Ambulatory Visit (INDEPENDENT_AMBULATORY_CARE_PROVIDER_SITE_OTHER)
Admission: RE | Admit: 2019-10-20 | Discharge: 2019-10-20 | Disposition: A | Discharge status: Home / Self Care | Payer: Medicare Other | Source: Ambulatory Visit | Attending: Family Medicine | Admitting: Family Medicine

## 2019-10-20 ENCOUNTER — Ambulatory Visit (INDEPENDENT_AMBULATORY_CARE_PROVIDER_SITE_OTHER): Payer: Medicare Other | Admitting: Family Medicine

## 2019-10-20 ENCOUNTER — Other Ambulatory Visit: Payer: Self-pay

## 2019-10-20 ENCOUNTER — Encounter: Payer: Self-pay | Admitting: Family Medicine

## 2019-10-20 VITALS — BP 120/76 | HR 67 | Temp 98.3°F | Ht 63.0 in | Wt 145.4 lb

## 2019-10-20 DIAGNOSIS — Z940 Kidney transplant status: Secondary | ICD-10-CM | POA: Diagnosis not present

## 2019-10-20 DIAGNOSIS — M5432 Sciatica, left side: Secondary | ICD-10-CM

## 2019-10-20 DIAGNOSIS — E1121 Type 2 diabetes mellitus with diabetic nephropathy: Secondary | ICD-10-CM

## 2019-10-20 LAB — POCT GLYCOSYLATED HEMOGLOBIN (HGB A1C): Hemoglobin A1C: 6.8 % — AB (ref 4.0–5.6)

## 2019-10-20 MED ORDER — DEXAMETHASONE SODIUM PHOSPHATE 10 MG/ML IJ SOLN
10.0000 mg | Freq: Once | INTRAMUSCULAR | Status: AC
  Administered 2019-10-20: 10 mg via INTRAMUSCULAR

## 2019-10-20 NOTE — Progress Notes (Signed)
This visit was conducted in person.  BP 120/76 (BP Location: Left Arm, Patient Position: Sitting, Cuff Size: Normal)   Pulse 67   Temp 98.3 F (36.8 C) (Temporal)   Ht '5\' 3"'  (1.6 m)   Wt 145 lb 6 oz (65.9 kg)   LMP  (LMP Unknown)   SpO2 98%   BMI 25.75 kg/m    CC: L hip pain Subjective:    Patient ID: Kristen Kirk, female    DOB: 12/21/1950, 69 y.o.   MRN: [id number]  HPI: Kristen Kirk is a 69 y.o. female presenting on 10/20/2019 for Hip Pain (C/o left hip pain radiating down into posterior thigh.  States the tramadol helped. )   See prior note for details. Seen here 10/08/2019 with dx L sciatica treated with 9d prednisone taper without much benefit but pain has persisted. She also continues scheduled tylenol 1031m. Tramadol helped with pain but caused significant dizziness. Piriformis syndrome exercises actually worsened pain.   Pain started Christmas, worsened last weekend. Describes pain stemming from L upper buttock down posterior thigh to knee. Worse with walking.   Denies bowel/bladder changes, leg numbness/weakness or saddle anesthesia.      Relevant past medical, surgical, family and social history reviewed and updated as indicated. Interim medical history since our last visit reviewed. Allergies and medications reviewed and updated. Outpatient Medications Prior to Visit  Medication Sig Dispense Refill  . alendronate (FOSAMAX) 70 MG tablet Take 70 mg by mouth once a week. Take with a full glass of water on an empty stomach.    .Marland Kitchenaspirin 81 MG tablet Take 81 mg by mouth daily.    . Blood Glucose Monitoring Suppl (ONE TOUCH ULTRA SYSTEM KIT) W/DEVICE KIT 1 kit by Does not apply route once. 1 each 0  . carvedilol (COREG) 25 MG tablet Take 1.5 tablets (37.5 mg total) by mouth 2 (two) times daily with a meal.    . Cholecalciferol (VITAMIN D3) 1000 units CAPS Take 2 capsules (2,000 Units total) by mouth daily. 60 capsule   . docusate sodium (COLACE) 100 MG capsule  Take 1 capsule (100 mg total) by mouth 2 (two) times daily.  0  . dorzolamide-timolol (COSOPT) 22.3-6.8 MG/ML ophthalmic solution Place 1 drop into both eyes 3 (three) times daily.      . famotidine (PEPCID) 20 MG tablet Take 1 tablet (20 mg total) by mouth 2 (two) times daily.    .Marland Kitchenglucose blood test strip Check sugars once daily 100 each 11  . Melatonin 3 MG CAPS Take 1-2 capsules (3-6 mg total) by mouth at bedtime as needed (sleep).  0  . Multiple Vitamin (MULTIVITAMIN) tablet Take 1 tablet by mouth daily.      . mycophenolate (MYFORTIC) 180 MG EC tablet Take 2 tablets (360 mg total) by mouth 2 (two) times daily.    . pantoprazole (PROTONIX) 20 MG tablet TAKE 1 TABLET BY MOUTH EVERY DAY 90 tablet 1  . polyethylene glycol (MIRALAX / GLYCOLAX) packet Take 17 g by mouth daily as needed for moderate constipation. 14 each 0  . pravastatin (PRAVACHOL) 80 MG tablet TAKE 1 TABLET BY MOUTH  EVERY EVENING 90 tablet 0  . sodium bicarbonate 650 MG tablet Take 1 tablet (650 mg total) by mouth 2 (two) times daily.    . tacrolimus (PROGRAF) 0.5 MG capsule 2 tabs in am and 1 at night    . traMADol (ULTRAM) 50 MG tablet Take 0.5-1 tablets (25-50 mg total) by  mouth 3 (three) times daily as needed for up to 5 days for moderate pain. 15 tablet 0  . travoprost, benzalkonium, (TRAVATAN) 0.004 % ophthalmic solution Place 1 drop into both eyes at bedtime.      . predniSONE (DELTASONE) 10 MG tablet Take three tablets for 3 days followed by two tablets for 3 days followed by one tablet for 3 days 18 tablet 0   No facility-administered medications prior to visit.     Per HPI unless specifically indicated in ROS section below Review of Systems Objective:    BP 120/76 (BP Location: Left Arm, Patient Position: Sitting, Cuff Size: Normal)   Pulse 67   Temp 98.3 F (36.8 C) (Temporal)   Ht '5\' 3"'  (1.6 m)   Wt 145 lb 6 oz (65.9 kg)   LMP  (LMP Unknown)   SpO2 98%   BMI 25.75 kg/m   Wt Readings from Last 3  Encounters:  10/20/19 145 lb 6 oz (65.9 kg)  10/08/19 146 lb 11.2 oz (66.5 kg)  03/25/19 147 lb (66.7 kg)    Physical Exam Vitals and nursing note reviewed.  Constitutional:      Appearance: Normal appearance. She is not ill-appearing.  Musculoskeletal:        General: Normal range of motion.     Comments:  No pain midline spine No paraspinous mm tenderness + SLR on left No pain with int/ext rotation at hip. + FABER on left No pain at SIJ, GTB bilaterally.  Pain to palpation at L sciatic notch  Skin:    General: Skin is warm and dry.     Findings: No erythema or rash.  Neurological:     General: No focal deficit present.     Mental Status: She is alert.     Comments: 5/5 strength BLE  Psychiatric:        Mood and Affect: Mood normal.        Behavior: Behavior normal.       Lab Results  Component Value Date   CREATININE 1.17 12/18/2018   BUN 25 (H) 12/18/2018   NA 139 12/18/2018   K 4.5 12/18/2018   CL 106 12/18/2018   CO2 25 12/18/2018   Cr 0.9 ([facility] Lab Results  Component Value Date   HGBA1C 6.8 (A) 10/20/2019    Assessment & Plan:  This visit occurred during the SARS-CoV-2 public health emergency.  Safety protocols were in place, including screening questions prior to the visit, additional usage of staff PPE, and extensive cleaning of exam room while observing appropriate contact time as indicated for disinfecting solutions.   Problem List Items Addressed This Visit    Left sided sciatica - Primary    Anticipate persistent piriformis syndrome. Persistent symptoms despite recent prednisone taper. Tramadol helpful but causing side effects - suggested 1/2 tab at a time. Check lumbar films today. Will provide with steroid injection and refer to PT. If fails PT, will refer to spine clinic for further evaluation/management. Pt agrees with plan.  Continue to avoid NSAIDs in h/o kidney transplant.       Relevant Orders   DG Lumbar  Spine Complete (Completed)   Ambulatory referral to Physical Therapy   Kidney transplant status   Controlled type 2 diabetes mellitus with diabetic nephropathy (HCC)    Update A1c. Recent prednisone course. Not on antihyperglycemic meds.       Relevant Orders   POCT glycosylated hemoglobin (Hb A1C) (Completed)  Meds ordered this encounter  Medications  . dexamethasone (DECADRON) injection 10 mg   Orders Placed This Encounter  Procedures  . DG Lumbar Spine Complete    Standing Status:   Future    Number of Occurrences:   1    Standing Expiration Date:   12/17/2020    Order Specific Question:   Reason for Exam (SYMPTOM  OR DIAGNOSIS REQUIRED)    Answer:   L radiculopathy, piriformis syndrome on left x 3 wks, worsening    Order Specific Question:   Preferred imaging location?    Answer:   [facility]    Order Specific Question:   Radiology Contrast Protocol - do NOT remove file path    Answer:   [email or web address]  . Ambulatory referral to Physical Therapy    Referral Priority:   Routine    Referral Type:   Physical Medicine    Referral Reason:   Specialty Services Required    Requested Specialty:   Physical Therapy    Number of Visits Requested:   1  . POCT glycosylated hemoglobin (Hb A1C)    Follow up plan: Return if symptoms worsen or fail to improve.  Ria Bush, MD

## 2019-10-20 NOTE — Assessment & Plan Note (Addendum)
Anticipate persistent piriformis syndrome. Persistent symptoms despite recent prednisone taper. Tramadol helpful but causing side effects - suggested 1/2 tab at a time. Check lumbar films today. Will provide with steroid injection and refer to PT. If fails PT, will refer to spine clinic for further evaluation/management. Pt agrees with plan.  Continue to avoid NSAIDs in h/o kidney transplant.

## 2019-10-20 NOTE — Assessment & Plan Note (Signed)
Update A1c. Recent prednisone course. Not on antihyperglycemic meds.

## 2019-10-20 NOTE — Patient Instructions (Addendum)
Lumbar xrays today.  A1c today 6.8%.  Continue tylenol, with tramadol for breakthrough pain, use 1/2 tablet at a time.  Shot of steroid today (dexamethasone 10mg ).  We will refer you to physical therapy. If no better let me know for referral to back doctor.

## 2019-10-21 ENCOUNTER — Encounter: Payer: Self-pay | Admitting: Internal Medicine

## 2019-10-21 ENCOUNTER — Telehealth: Payer: Self-pay

## 2019-10-21 ENCOUNTER — Ambulatory Visit (INDEPENDENT_AMBULATORY_CARE_PROVIDER_SITE_OTHER): Payer: Medicare Other | Admitting: Internal Medicine

## 2019-10-21 VITALS — BP 154/84 | HR 88 | Temp 96.9°F | Wt 145.0 lb

## 2019-10-21 DIAGNOSIS — I10 Essential (primary) hypertension: Secondary | ICD-10-CM

## 2019-10-21 NOTE — Progress Notes (Signed)
Subjective:    Patient ID: Kristen Kirk, female    DOB: Dec 03, 1950, 69 y.o.   MRN: [id number]  HPI  Pt presents to the clinic today with c/o elevated blood pressure, headache and lightheadedness. She reports her BP this am was 207/91. She reports no changes other than getting a steroid shot yesterday. The headache is located. She describes the dizziness as a sense of imbalance, not that the room is spinning. She takes Carvedilol as prescribed. Her BP today is 158/84.  Review of Systems      Past Medical History:  Diagnosis Date  . Anemia in chronic kidney disease    a. s/p aranesp 11/2013 now starting epo with HD 06/2014  . Diabetes mellitus type II    a. Currently diet controlled since losing weight.  . DJD (degenerative joint disease)   . ESRD (end stage renal disease) ([place])    a. Followed @ [facility];  b. On dialysis since late 2015 (MWF).  Marland Kitchen ESRD (end stage renal disease) on dialysis [facility])    stage 5 per [facility] renal (Dr. Daphane Shepherd) considering transplant Access: LUE fistula Establishing with [facility]   . Essential hypertension   . GERD (gastroesophageal reflux disease)   . Glaucoma    a. L>R  . History of echocardiogram    a. 01/2015 [facility]): EF ?55%, triv MR, mildly dil LA, nl PV.  Marland Kitchen History of stress test    a. 03/2013 [facility]): EF 63%, no ischemia.  Marland Kitchen History of Urge Incontinence   . HLD (hyperlipidemia)   . Osteoarthritis   . Osteoporosis 05/03/2019   DEXA 04/2019 - 1.5 spine, -2.1 hip, -2.5 forearm Check phosphate and PTH to guide further management in h/o kidney transplant  . Syncope    a. 12/2015.  . Vaginal atrophy    a. vagifem caused spotting, normal TVS  . Vitamin D deficiency     Current Outpatient Medications  Medication Sig Dispense Refill  . alendronate (FOSAMAX) 70 MG tablet Take 70 mg by mouth once a week. Take with a full glass of water on an empty stomach.    Marland Kitchen aspirin 81 MG tablet Take 81 mg by mouth daily.    . Blood Glucose Monitoring  Suppl (ONE TOUCH ULTRA SYSTEM KIT) W/DEVICE KIT 1 kit by Does not apply route once. 1 each 0  . carvedilol (COREG) 25 MG tablet Take 1.5 tablets (37.5 mg total) by mouth 2 (two) times daily with a meal.    . Cholecalciferol (VITAMIN D3) 1000 units CAPS Take 2 capsules (2,000 Units total) by mouth daily. 60 capsule   . docusate sodium (COLACE) 100 MG capsule Take 1 capsule (100 mg total) by mouth 2 (two) times daily.  0  . dorzolamide-timolol (COSOPT) 22.3-6.8 MG/ML ophthalmic solution Place 1 drop into both eyes 3 (three) times daily.      . famotidine (PEPCID) 20 MG tablet Take 1 tablet (20 mg total) by mouth 2 (two) times daily.    Marland Kitchen glucose blood test strip Check sugars once daily 100 each 11  . Melatonin 3 MG CAPS Take 1-2 capsules (3-6 mg total) by mouth at bedtime as needed (sleep).  0  . Multiple Vitamin (MULTIVITAMIN) tablet Take 1 tablet by mouth daily.      . mycophenolate (MYFORTIC) 180 MG EC tablet Take 2 tablets (360 mg total) by mouth 2 (two) times daily.    . pantoprazole (PROTONIX) 20 MG tablet TAKE 1 TABLET BY MOUTH EVERY DAY  90 tablet 1  . polyethylene glycol (MIRALAX / GLYCOLAX) packet Take 17 g by mouth daily as needed for moderate constipation. 14 each 0  . pravastatin (PRAVACHOL) 80 MG tablet TAKE 1 TABLET BY MOUTH  EVERY EVENING 90 tablet 0  . sodium bicarbonate 650 MG tablet Take 1 tablet (650 mg total) by mouth 2 (two) times daily.    . tacrolimus (PROGRAF) 0.5 MG capsule 2 tabs in am and 1 at night    . traMADol (ULTRAM) 50 MG tablet Take 0.5-1 tablets (25-50 mg total) by mouth 3 (three) times daily as needed for up to 5 days for moderate pain. 15 tablet 0  . travoprost, benzalkonium, (TRAVATAN) 0.004 % ophthalmic solution Place 1 drop into both eyes at bedtime.       No current facility-administered medications for this visit.    Allergies  Allergen Reactions  . Brimonidine     Other reaction(s): Other (See Comments) eye redness  . Shellfish-Derived Products  Swelling    Family History  Problem Relation Age of Onset  . Other Father        she did not know her father.  . Diabetes Mother   . Heart failure Mother        died @ 53  . Stroke Brother   . Hyperlipidemia Brother   . Hypertension Brother   . Hyperlipidemia Brother   . Hypertension Brother   . Hyperlipidemia Sister   . Hypertension Sister   . Hyperlipidemia Sister   . Hypertension Sister   . Heart failure Maternal Grandfather   . Breast cancer Neg Hx   . Colon cancer Neg Hx   . Esophageal cancer Neg Hx   . Stomach cancer Neg Hx   . Rectal cancer Neg Hx     Social History   Socioeconomic History  . Marital status: Married    Spouse name: Not on file  . Number of children: 1  . Years of education: Not on file  . Highest education level: Not on file  Occupational History  . 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 Smoking status: Former Smoker    Quit date: 10/02/1985    Years since quitting: 34.0  . Smokeless tobacco: Never Used  . Tobacco comment: Remote- quit ~ late 1980's.  Substance and Sexual Activity  . Alcohol use: No    Alcohol/week: 0.0 standard drinks  . Drug use: No  . Sexual activity: Yes    Partners: Male  Other Topics Concern  . Not on file  Social History Narrative   Lives with husband in [place].   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 Difficulty of Paying Living Expenses: Not on file  Food Insecurity:   . Worried About Charity fundraiser in the Last Year: Not on file  . Ran Out of Food in the Last Year: Not on file  Transportation Needs:   . Lack of Transportation (Medical): Not on file  . Lack of Transportation (Non-Medical): Not on file  Physical Activity:   . Days of Exercise per Week: Not on  file  . Minutes of Exercise per Session: Not on file  Stress:   . Feeling of Stress : Not on file  Social Connections:   . Frequency of Communication with Friends and Family: Not on file  . Frequency of Social Gatherings with Friends and Family: Not on file  . Attends Religious Services: Not on file  . Active Member of Clubs or Organizations: Not on file  . Attends Archivist Meetings: Not on file  . Marital Status: Not on file  Intimate Partner Violence:   . Fear of Current or Ex-Partner: Not on file  . Emotionally Abused: Not on file  . Physically Abused: Not on file  . Sexually Abused: Not on file     Constitutional: Pt reports headache. Denies fever, malaise, fatigue, or abrupt weight changes.  Respiratory: Denies difficulty breathing, shortness of breath, cough or sputum production.   Cardiovascular: Denies chest pain, chest tightness, palpitations or swelling in the hands or feet.  Neurological: Pt reports lightheadedness. Denies  difficulty with memory, difficulty with speech or problems with balance and coordination.    No other specific complaints in a complete review of systems (except as listed in HPI above).  Objective:   Physical Exam   BP (!) 154/84   Pulse 88   Temp (!) 96.9 F (36.1 C) (Temporal)   Wt 145 lb (65.8 kg)   LMP  (LMP Unknown)   SpO2 98%   BMI 25.69 kg/m   Wt Readings from Last 3 Encounters:  10/20/19 145 lb 6 oz (65.9 kg)  10/08/19 146 lb 11.2 oz (66.5 kg)  03/25/19 147 lb (66.7 kg)    General: Appears her stated age, well developed, well nourished in NAD. Cardiovascular: Normal rate and rhythm. S1,S2 noted.  No murmur, rubs or gallops noted. No JVD or BLE edema.  Pulmonary/Chest: Normal effort and positive vesicular breath sounds. No respiratory distress. No wheezes, rales or ronchi noted.  Neurological: Alert and oriented.    BMET    Component Value Date/Time   NA 139 12/18/2018 1200   NA 140 10/30/2013 0000   NA 136  10/23/2013 1918   K 4.5 12/18/2018 1200   K 4.5 10/30/2013 0000   CL 106 12/18/2018 1200   CL 104 10/23/2013 1918   CL 102 01/10/2012 0000   CO2 25 12/18/2018 1200   CO2 25 10/23/2013 1918   GLUCOSE 135 (H) 12/18/2018 1200   GLUCOSE 150 (H) 10/23/2013 1918   BUN 25 (H) 12/18/2018 1200   BUN 88 (H) 10/23/2013 1918   CREATININE 1.17 12/18/2018 1200   CREATININE 6.79 10/30/2013 0000   CALCIUM 10.1 12/18/2018 1200   CALCIUM 9.1 10/23/2013 1918   CALCIUM 9.6 01/10/2012 0000   GFRNONAA 4 (L) 12/08/2015 0905   GFRNONAA 6 (L) 10/23/2013 1918   GFRAA 5 (L) 12/08/2015 0905   GFRAA 7 (L) 10/23/2013 1918    Lipid Panel     Component Value Date/Time   CHOL 165 12/18/2018 1200   TRIG 148.0 12/18/2018 1200   HDL 48.30 12/18/2018 1200   CHOLHDL 3 12/18/2018 1200   VLDL 29.6 12/18/2018 1200   LDLCALC 87 12/18/2018 1200    CBC    Component Value Date/Time   WBC 4.9 12/18/2018 1200   RBC 4.76 12/18/2018 1200   HGB 13.2 12/18/2018 1200   HGB 8.7 10/30/2013 0000   HCT 40.0 12/18/2018 1200   HCT 29.8 (L) 10/23/2013 1918   HCT 38 01/10/2012 0000   PLT 211.0 12/18/2018 1200   PLT 214 10/23/2013 1918   MCV 84.1 12/18/2018 1200   MCV 83 10/23/2013 1918   MCH 27.3  12/08/2015 0905   MCHC 32.9 12/18/2018 1200   RDW 13.7 12/18/2018 1200   RDW 13.5 10/23/2013 1918   LYMPHSABS 0.8 12/18/2018 1200   LYMPHSABS 1.2 10/23/2013 1918   MONOABS 0.6 12/18/2018 1200   MONOABS 0.6 10/23/2013 1918   EOSABS 0.2 12/18/2018 1200   EOSABS 0.1 10/23/2013 1918   BASOSABS 0.0 12/18/2018 1200   BASOSABS 0.0 10/23/2013 1918    Hgb A1C Lab Results  Component Value Date   HGBA1C 6.8 (A) 10/20/2019          Assessment & Plan:   HTN:  Slightly elevated today, likely due to steroid shot Will have her continue Carvedilol for now If BP remains elevated, would recommend follow up with PCP on Monday  Return precautions discussed Webb Silversmith, NP This visit occurred during the SARS-CoV-2 public  health emergency.  Safety protocols were in place, including screening questions prior to the visit, additional usage of staff PPE, and extensive cleaning of exam room while observing appropriate contact time as indicated for disinfecting solutions.

## 2019-10-21 NOTE — Telephone Encounter (Signed)
noted 

## 2019-10-21 NOTE — Telephone Encounter (Signed)
Pt called back and BP now 147/71 pt said has gone down more. Earlier last BP was 148/76 and pt wanted to be seen due to BP higher than usual even though explained that steroid injection can cause BP to be elevated; pt is going to keep appt 10/21/19 with Avie Echevaria NP.

## 2019-10-21 NOTE — Telephone Encounter (Signed)
Will discuss at upcoming appt.

## 2019-10-21 NOTE — Telephone Encounter (Signed)
Pt seen 10/20/19 and given steroid shot; today 8 AM BP 207/91 rechecked BP 140/103. 8;%% AM BP 148/76 P66. Advised pt steroid shot can cause BP to be elevated and to watch salt in her diet. Pt voiced understanding. Pt took 1/2 tab of tramadol last night and one and previously tramadol can make pt lightheaded. This morning pt has slight H/A and light headed. (room does not spin around). No CP or SOB. Pt has no covid symptoms, no travel and no known exposure to + covid. Pt usually takes carvedilol 25 mg taking 1/2 tab bid prescribed by kidney dr at [facility]. (different instructions than on med list). Was going to send phone note but pt said her BP has never been this high before and would feel better if seen. Pt scheduled 30' appt with Avie Echevaria NP today at 11:15. (first appt pt could get transportation to office. Advised pt she should not drive and UC & ED precautions given and pt voiced understanding.FYI to Avie Echevaria NP. Dr Darnell Level is out of office but will send as FYI.

## 2019-10-21 NOTE — Telephone Encounter (Signed)
Noted. Thank you for seeing her. 

## 2019-10-21 NOTE — Patient Instructions (Signed)

## 2019-12-31 ENCOUNTER — Telehealth: Payer: Self-pay | Admitting: Family Medicine

## 2019-12-31 NOTE — Telephone Encounter (Signed)
Returned pt's call.  She wanted to mention she stopped taking the tramadol due to it causing dizziness during the day.  She will continue Tylenol until OV with Dr. Darnell Level on 01/05/20.

## 2019-12-31 NOTE — Telephone Encounter (Signed)
Pt called wanting lisa to call her regarding back pain.  Pt didn't want to go into it with me.  She has appointment 4/5 offered sooner appointment with another provider pt only wanted to see dr g

## 2020-01-05 ENCOUNTER — Encounter: Payer: Self-pay | Admitting: Family Medicine

## 2020-01-05 ENCOUNTER — Ambulatory Visit (INDEPENDENT_AMBULATORY_CARE_PROVIDER_SITE_OTHER)
Admission: RE | Admit: 2020-01-05 | Discharge: 2020-01-05 | Disposition: A | Discharge status: Home / Self Care | Payer: Medicare Other | Source: Ambulatory Visit | Attending: Family Medicine | Admitting: Family Medicine

## 2020-01-05 ENCOUNTER — Other Ambulatory Visit: Payer: Self-pay

## 2020-01-05 ENCOUNTER — Ambulatory Visit (INDEPENDENT_AMBULATORY_CARE_PROVIDER_SITE_OTHER): Payer: Medicare Other | Admitting: Family Medicine

## 2020-01-05 VITALS — BP 126/78 | HR 68 | Temp 97.5°F | Ht 63.0 in | Wt 148.2 lb

## 2020-01-05 DIAGNOSIS — M5442 Lumbago with sciatica, left side: Secondary | ICD-10-CM

## 2020-01-05 DIAGNOSIS — M818 Other osteoporosis without current pathological fracture: Secondary | ICD-10-CM | POA: Diagnosis not present

## 2020-01-05 DIAGNOSIS — M546 Pain in thoracic spine: Secondary | ICD-10-CM | POA: Diagnosis not present

## 2020-01-05 DIAGNOSIS — Z940 Kidney transplant status: Secondary | ICD-10-CM

## 2020-01-05 DIAGNOSIS — G8929 Other chronic pain: Secondary | ICD-10-CM

## 2020-01-05 MED ORDER — TIZANIDINE HCL 2 MG PO TABS: 2.0000 mg | ORAL_TABLET | Freq: Two times a day (BID) | ORAL | 0 refills | Status: DC | PRN

## 2020-01-05 NOTE — Assessment & Plan Note (Signed)
Recent fosamax start (2021)

## 2020-01-05 NOTE — Assessment & Plan Note (Addendum)
New with midline spine tenderness. No known inciting trauma. In OP hx, check films r/o vertebral compression fracture.  She does have trapezius mm spasm/tightness - add tizanidine. Sedation precautions discussed.

## 2020-01-05 NOTE — Patient Instructions (Signed)
Try tizanidine 2mg  1 tablet twice daily as needed for neck pain/muscle spasms.  Thoracic spine xrays today I will order MRI of lumbar spine and refer you to the spine clinic.

## 2020-01-05 NOTE — Progress Notes (Signed)
This visit was conducted in person.  BP 126/78 (BP Location: Left Arm, Patient Position: Sitting, Cuff Size: Normal)   Pulse 68   Temp (!) 97.5 F (36.4 C) (Temporal)   Ht '5\' 3"'  (1.6 m)   Wt 148 lb 3 oz (67.2 kg)   LMP  (LMP Unknown)   SpO2 98%   BMI 26.25 kg/m    CC: ongoing back pain Subjective:    Patient ID: Kristen Kirk, female    DOB: Feb 21, 1951, 69 y.o.   MRN: [id number]  HPI: Kristen Kirk is a 69 y.o. female presenting on 01/05/2020 for Back Pain (C/o ongoing back pain.  States pain is not improving and is radiating down posterior left leg again. )   See prior notes for details. Seen twice 10/2019 with dx L sciatica s/p prednisone taper without benefit. She has treated with scheduled tylenol 1077m. Tramadol caused significant dizziness (even 1/2 tab at a time) so she stopped this. Piriformis syndrome exercises worsened pain. IM dexamethasone caused marked hypertensive response. Referred to PT - she found some benefit with this, PT finished 12/2019. Pain improved some after dexamethasone shot and with PT. She has home exercise program.   Avoid NSAIDs in h/o kidney transplant.   Pain started 09/2019, describes pain stemming from L upper buttock down posterior thigh to knee, worse with walking. Pain has restarted over the past 2 weeks, uncomfortable to sit in chair, now pain starting at mid to upper thoracic spine and traveling down the back. Also has some neck discomfort. Pain is better when she lays down.   No bowel/bladder changes, leg numbness/weakness or saddle anesthesia. No calf pain.   She is on fosamax weekly.    DG Lumbar Spine Complete CLINICAL DATA:  Left-sided sciatica.  EXAM: LUMBAR SPINE - COMPLETE 4+ VIEW  COMPARISON:  None.  FINDINGS: Mild retrolisthesis L1-2, L2-3, L3-4. Approximately 8 mm anterolisthesis L4-5. Disc and facet degeneration at L4-5. Disc degeneration and mild spurring L1-2, L2-3 and L3-4. Negative for pars defect  Negative  for fracture or mass.  Moderate stool in the colon with normal bowel gas pattern. Surgical clips in the right pelvis.  IMPRESSION: Lumbar spondylosis. Grade 1 anterolisthesis L4-5 with disc and facet degeneration and possible spinal stenosis.  Constipation  Electronically Signed   By: CFranchot GalloM.D.   On: 10/20/2019 12:41      Relevant past medical, surgical, family and social history reviewed and updated as indicated. Interim medical history since our last visit reviewed. Allergies and medications reviewed and updated. Outpatient Medications Prior to Visit  Medication Sig Dispense Refill  . alendronate (FOSAMAX) 70 MG tablet Take 70 mg by mouth once a week. Take with a full glass of water on an empty stomach.    .Marland Kitchenaspirin 81 MG tablet Take 81 mg by mouth daily.    . Blood Glucose Monitoring Suppl (ONE TOUCH ULTRA SYSTEM KIT) W/DEVICE KIT 1 kit by Does not apply route once. 1 each 0  . carvedilol (COREG) 25 MG tablet Take 1.5 tablets (37.5 mg total) by mouth 2 (two) times daily with a meal.    . Cholecalciferol (VITAMIN D3) 1000 units CAPS Take 2 capsules (2,000 Units total) by mouth daily. 60 capsule   . docusate sodium (COLACE) 100 MG capsule Take 1 capsule (100 mg total) by mouth 2 (two) times daily.  0  . dorzolamide-timolol (COSOPT) 22.3-6.8 MG/ML ophthalmic solution Place 1 drop into both eyes 3 (three) times daily.      .Marland Kitchen  famotidine (PEPCID) 20 MG tablet Take 1 tablet (20 mg total) by mouth 2 (two) times daily.    Marland Kitchen glucose blood test strip Check sugars once daily 100 each 11  . Melatonin 3 MG CAPS Take 1-2 capsules (3-6 mg total) by mouth at bedtime as needed (sleep).  0  . Multiple Vitamin (MULTIVITAMIN) tablet Take 1 tablet by mouth daily.      . mycophenolate (MYFORTIC) 180 MG EC tablet Take 2 tablets (360 mg total) by mouth 2 (two) times daily.    . pantoprazole (PROTONIX) 20 MG tablet TAKE 1 TABLET BY MOUTH EVERY DAY 90 tablet 1  . polyethylene glycol (MIRALAX /  GLYCOLAX) packet Take 17 g by mouth daily as needed for moderate constipation. 14 each 0  . pravastatin (PRAVACHOL) 80 MG tablet TAKE 1 TABLET BY MOUTH  EVERY EVENING 90 tablet 0  . sodium bicarbonate 650 MG tablet Take 1 tablet (650 mg total) by mouth 2 (two) times daily.    . tacrolimus (PROGRAF) 0.5 MG capsule 2 tabs in am and 1 at night    . travoprost, benzalkonium, (TRAVATAN) 0.004 % ophthalmic solution Place 1 drop into both eyes at bedtime.       No facility-administered medications prior to visit.     Per HPI unless specifically indicated in ROS section below Review of Systems Objective:    BP 126/78 (BP Location: Left Arm, Patient Position: Sitting, Cuff Size: Normal)   Pulse 68   Temp (!) 97.5 F (36.4 C) (Temporal)   Ht '5\' 3"'  (1.6 m)   Wt 148 lb 3 oz (67.2 kg)   LMP  (LMP Unknown)   SpO2 98%   BMI 26.25 kg/m   Wt Readings from Last 3 Encounters:  01/05/20 148 lb 3 oz (67.2 kg)  10/21/19 145 lb (65.8 kg)  10/20/19 145 lb 6 oz (65.9 kg)    Physical Exam Vitals and nursing note reviewed.  Constitutional:      Appearance: Normal appearance. She is not ill-appearing.  Neck:     Comments: No midline cervical neck pain Musculoskeletal:        General: Tenderness present. Normal range of motion.     Cervical back: Normal range of motion and neck supple. No rigidity or tenderness.     Comments:  No pain midline cervical spine Tender to palpation trapezius mm, tightness present Tender to palpation midline upper thoracic spine No pain midline lumbar spine No paraspinous mm tenderness Neg SLR bilaterally. No pain with int/ext rotation at hip. Neg FABER. No pain at GTB or sciatic notch bilaterally.  Tender to palpation at bilateral SIJ  Neurological:     Mental Status: She is alert.     Sensory: Sensation is intact.     Motor: Motor function is intact. No weakness.     Coordination: Coordination is intact.     Deep Tendon Reflexes:     Reflex Scores:       Patellar reflexes are 0 on the right side and 0 on the left side.      Achilles reflexes are 0 on the right side and 0 on the left side.    Comments:  5/5 strength BLE Sensation intact Diminished DTRs       Results for orders placed or performed in visit on 10/20/19  POCT glycosylated hemoglobin (Hb A1C)  Result Value Ref Range   Hemoglobin A1C 6.8 (A) 4.0 - 5.6 %   HbA1c POC (<> result, manual entry)  HbA1c, POC (prediabetic range)     HbA1c, POC (controlled diabetic range)     Assessment & Plan:  This visit occurred during the SARS-CoV-2 public health emergency.  Safety protocols were in place, including screening questions prior to the visit, additional usage of staff PPE, and extensive cleaning of exam room while observing appropriate contact time as indicated for disinfecting solutions.   Problem List Items Addressed This Visit    Thoracic spine pain    New with midline spine tenderness. No known inciting trauma. In OP hx, check films r/o vertebral compression fracture.  She does have trapezius mm spasm/tightness - add tizanidine. Sedation precautions discussed.       Relevant Medications   tiZANidine (ZANAFLEX) 2 MG tablet   Other Relevant Orders   DG Thoracic Spine W/Swimmers   Ambulatory referral to Physical Medicine Rehab   Osteoporosis    Recent fosamax start (2021)      Kidney transplant status   Chronic lower back pain - Primary    Ongoing for 3+ months. Has failed conservative management (analgesic, oral and IM steroid course, PT course). Xray showed degenerative changes of discs and facet joints as well as possible spinal stenosis. Add tizanidine, check lumbar MRI, refer to spine clinic PM&R for further eval/management. Pt agrees with plan.       Relevant Medications   tiZANidine (ZANAFLEX) 2 MG tablet   Other Relevant Orders   MR Lumbar Spine Wo Contrast   Ambulatory referral to Physical Medicine Rehab       Meds ordered this encounter  Medications    . tiZANidine (ZANAFLEX) 2 MG tablet    Sig: Take 1 tablet (2 mg total) by mouth 2 (two) times daily as needed for muscle spasms (sedation precautions).    Dispense:  30 tablet    Refill:  0   Orders Placed This Encounter  Procedures  . DG Thoracic Spine W/Swimmers    Standing Status:   Future    Number of Occurrences:   1    Standing Expiration Date:   03/06/2021    Order Specific Question:   Reason for Exam (SYMPTOM  OR DIAGNOSIS REQUIRED)    Answer:   upper thoracic spine pain with midline tenderness to palpation    Order Specific Question:   Preferred imaging location?    Answer:   [facility]    Order Specific Question:   Radiology Contrast Protocol - do NOT remove file path    Answer:   \\[email or web address]  . MR Lumbar Spine Wo Contrast    Standing Status:   Future    Standing Expiration Date:   03/06/2021    Order Specific Question:   ** REASON FOR EXAM (FREE TEXT)    Answer:   chronic lumbar back pain failed conservative management    Order Specific Question:   What is the patient's sedation requirement?    Answer:   No Sedation    Order Specific Question:   Does the patient have a pacemaker or implanted devices?    Answer:   No    Order Specific Question:   Preferred imaging location?    Answer:   [facility] (table limit-400lbs)    Order Specific Question:   Radiology Contrast Protocol - do NOT remove file path    Answer:   \\[email or web address]  . Ambulatory referral to Physical Medicine Rehab    Referral Priority:   Routine    Referral Type:   Rehabilitation    Referral  Reason:   Specialty Services Required    Requested Specialty:   Physical Medicine and Rehabilitation    Number of Visits Requested:   1    Patient Instructions  Try tizanidine 16m 1 tablet twice daily as needed for neck pain/muscle spasms.  Thoracic spine xrays today I will order MRI of lumbar spine and refer you to the spine  clinic.    Follow up plan: Return if symptoms worsen or fail to improve.  JRia Bush MD

## 2020-01-05 NOTE — Assessment & Plan Note (Addendum)
Ongoing for 3+ months. Has failed conservative management (analgesic, oral and IM steroid course, PT course). Xray showed degenerative changes of discs and facet joints as well as possible spinal stenosis. Add tizanidine, check lumbar MRI, refer to spine clinic PM&R for further eval/management. Pt agrees with plan.

## 2020-01-06 ENCOUNTER — Telehealth: Payer: Self-pay | Admitting: Family Medicine

## 2020-01-06 MED ORDER — DIAZEPAM 2 MG PO TABS: 2.0000 mg | ORAL_TABLET | Freq: Once | ORAL | 0 refills | Status: AC

## 2020-01-06 NOTE — Telephone Encounter (Signed)
Patient is claustrophobic and is requesting medication be called in for her when she has her MRI.

## 2020-01-06 NOTE — Telephone Encounter (Signed)
Left message on vm per dpr relaying Dr. G's message.  

## 2020-01-06 NOTE — Telephone Encounter (Signed)
Valium 2mg  sent for patient - may take 1-2 tab PRN MRI.  Have someone drive her.

## 2020-01-13 ENCOUNTER — Other Ambulatory Visit: Payer: Self-pay

## 2020-01-13 ENCOUNTER — Ambulatory Visit
Admission: RE | Admit: 2020-01-13 | Discharge: 2020-01-13 | Disposition: A | Discharge status: Home / Self Care | Payer: Medicare Other | Source: Ambulatory Visit | Attending: Family Medicine | Admitting: Family Medicine

## 2020-01-13 DIAGNOSIS — M5442 Lumbago with sciatica, left side: Secondary | ICD-10-CM

## 2020-01-13 DIAGNOSIS — G8929 Other chronic pain: Secondary | ICD-10-CM

## 2020-01-15 ENCOUNTER — Other Ambulatory Visit: Payer: Self-pay | Admitting: Family Medicine

## 2020-01-20 ENCOUNTER — Telehealth: Payer: Self-pay | Admitting: Family Medicine

## 2020-01-20 MED ORDER — ACETAMINOPHEN-CODEINE 300-30 MG PO TABS: 1.0000 | ORAL_TABLET | Freq: Three times a day (TID) | ORAL | 0 refills | Status: DC | PRN

## 2020-01-20 NOTE — Addendum Note (Signed)
Addended by: Ria Bush on: 01/20/2020 05:10 PM   Modules accepted: Orders

## 2020-01-20 NOTE — Telephone Encounter (Signed)
Spoke with pt relaying Dr. Synthia Innocent message.  Verbalizes understanding.  States she has taken both codeine and hydrocodone previously and tolerated both.  Pt agrees to either medication.

## 2020-01-20 NOTE — Telephone Encounter (Signed)
I have sent tylenol #3 to her pharmacy.

## 2020-01-20 NOTE — Telephone Encounter (Signed)
Spoke with pt notifying her rx was sent.  Expresses her thanks.

## 2020-01-20 NOTE — Telephone Encounter (Signed)
Pt called for results  Ria Bush, MD  01/19/2020 8:07 AM EDT    Plz notify xray showed several new or progressed herniated disks of lumbar spine. Will await spine clinic (PM&R) eval/management (to see Chasnis next week).   [facility] 01/2020: L1/2 new L foraminal disc protrusion; L2/3 progressed R subarticular to R extraforaminal disc protrusion crowding R L3 nerve root; L3/4 progressed central disc protrusion and B subarticular and central canal stenosis and B neural foraminal narrowing; L4/5 progressive severe B subarticular and central canal stenosis, neural foraminal narrowing; L5/S1 small cnetral disc protrusion, enlarged L 37mm synovial facet cyst causing mod L subarticular stenosis with crwoding of L S1 nerve root, B neural foraminal stenosis. Progressive lumbar DDD.]    Discussed results, states that the Spine Specialist has already reached out to her and she is scheduled with them next week - Whitney Meeler, NP Pt is not wanting to see an NP for her first visit and she is going contact their office to see if they can ger her in the with Doctor soon, if not able then patient states she would like to be referred to [facility]. She will call us back.   Pt is having a lot pain and is wanting something sent in if possible.

## 2020-01-20 NOTE — Telephone Encounter (Signed)
Has not tolerated tramadol in the past Has she tried and tolerated codeine or hydrocodone in the past? May also continue tizanidine muscle relaxant.

## 2020-01-28 ENCOUNTER — Other Ambulatory Visit: Payer: Self-pay | Admitting: Family Medicine

## 2020-01-28 NOTE — Telephone Encounter (Signed)
Tizanidine Last filled:  01/05/20, #30 Last OV:  01/05/20, chronic low back pain Next OV:  none

## 2020-02-10 ENCOUNTER — Encounter: Payer: Self-pay | Admitting: Family Medicine

## 2020-02-11 HISTORY — PX: CATARACT EXTRACTION W/ INTRAOCULAR LENS IMPLANT: SHX1309

## 2020-02-24 ENCOUNTER — Other Ambulatory Visit: Payer: Self-pay | Admitting: Family Medicine

## 2020-02-24 NOTE — Telephone Encounter (Signed)
Last office visit 01/05/2020 for back pain.  Last refilled 01/29/2020 for #30 with no refills.  No future appointments.

## 2020-03-20 ENCOUNTER — Other Ambulatory Visit: Payer: Self-pay | Admitting: Family Medicine

## 2020-03-20 NOTE — Telephone Encounter (Signed)
Last office visit 01/05/2020 for back pain.  Last refilled 02/26/2020 for #30 with no refills.  No future appointments. Please advise

## 2020-04-02 ENCOUNTER — Other Ambulatory Visit: Payer: Self-pay | Admitting: Family Medicine

## 2020-04-02 NOTE — Telephone Encounter (Signed)
[facility] requesting 90-day rx.

## 2020-04-08 ENCOUNTER — Telehealth: Payer: Self-pay

## 2020-04-08 NOTE — Telephone Encounter (Signed)
Pt said since 03/31/20 pt started with head congestion, prod cough with yellow phlegm, H/A pain level 5 that is in top of head and forehead and runny nose. No chest congestion or fever or other covid symptoms. Pt scheduled virtual appt on 04/09/20 at 12 noon. UC & ED precautions given and pt voiced understanding.

## 2020-04-09 ENCOUNTER — Encounter: Payer: Self-pay | Admitting: Family Medicine

## 2020-04-09 ENCOUNTER — Other Ambulatory Visit: Payer: Self-pay

## 2020-04-09 ENCOUNTER — Other Ambulatory Visit: Payer: Self-pay | Admitting: Family Medicine

## 2020-04-09 ENCOUNTER — Telehealth (INDEPENDENT_AMBULATORY_CARE_PROVIDER_SITE_OTHER): Payer: Medicare Other | Admitting: Family Medicine

## 2020-04-09 VITALS — BP 158/77 | HR 60 | Temp 97.9°F | Ht 63.0 in | Wt 135.5 lb

## 2020-04-09 DIAGNOSIS — I10 Essential (primary) hypertension: Secondary | ICD-10-CM | POA: Diagnosis not present

## 2020-04-09 DIAGNOSIS — H401133 Primary open-angle glaucoma, bilateral, severe stage: Secondary | ICD-10-CM | POA: Diagnosis not present

## 2020-04-09 DIAGNOSIS — J019 Acute sinusitis, unspecified: Secondary | ICD-10-CM | POA: Diagnosis not present

## 2020-04-09 DIAGNOSIS — Z940 Kidney transplant status: Secondary | ICD-10-CM

## 2020-04-09 DIAGNOSIS — B9689 Other specified bacterial agents as the cause of diseases classified elsewhere: Secondary | ICD-10-CM | POA: Insufficient documentation

## 2020-04-09 MED ORDER — AMOXICILLIN-POT CLAVULANATE 875-125 MG PO TABS: 1.0000 | ORAL_TABLET | Freq: Two times a day (BID) | ORAL | 0 refills | Status: AC

## 2020-04-09 NOTE — Assessment & Plan Note (Signed)
BP elevated today - recently took decongestant. Advised avoiding decongestants from here on.

## 2020-04-09 NOTE — Telephone Encounter (Signed)
E-scribed refill.  Plz schedule wellness, cpe and lab visits.  

## 2020-04-09 NOTE — Assessment & Plan Note (Signed)
Will treat for bacterial sinusitis given duration and progression of symptoms as well as immunocompromised status. Add nasal saline irrigation. Avoid nasal steroid given recent cataract/glaucoma surgery. Update if not improving with treatment.

## 2020-04-09 NOTE — Progress Notes (Signed)
   Kristen Kirk - 69 y.o. female  MRN [id number]  Date of Birth: 1951-03-30  PCP: Ria Bush, MD  This service was provided via telemedicine. Phone Visit performed on 04/09/2020. Phone visit performed as patient does not have access to video embedded technology.    Rationale for phone visit along with limitations reviewed. I discussed the limitations, risks, security and privacy concerns of performing a phone visit and the availability of in person appointments. I also discussed with the patient that there may be a patient responsible charge related to this service. Patient consented to telephone encounter.    Location of patient: home Location of provider: in office, [place] [facility] Name of referring provider: N/A   Names of persons and role in encounter: Provider: Ria Bush, MD  Patient: Kristen Kirk  Other: N/A   Time on call: 11:40am - 11:59am   Subjective: Chief Complaint  Patient presents with  . Cough    C/o cough, HA, nasal congestion and runny nose.  Sxs started 03/31/20.  Tried Tylenol and Dayquil, not helpful.      HPI:  Symptoms started at choir practice last Wednesday - cousin sick with cold (cousin had negative COVID test). Ongoing symptoms for the past 9 days - head congestion, HA, rhinorrhea with thick mucous. Symptoms seem to localize to right frontal sinuses. Not sleeping well due to congestion.   No fevers. Not a lot of coughing. No dyspnea. No ear or tooth pain, ST, abd pain, nausea or diarrhea. No loss of taste or smell. Appetite good.   Symptoms plateau  No h/o asthma.  Non smoker.  So far treating with tylenol and dayquil.   Known kidney transplant recipient on tacrolimus, mycophenolate.  Recent R eye cataract and glaucoma surgery, pending left.    Objective/Observations:  No physical exam or vital signs collected unless specifically identified below.   BP (!) 158/77   Pulse 60   Temp 97.9 F (36.6 C)   Ht 5\' 3"   (1.6 m)   Wt 135 lb 8 oz (61.5 kg)   LMP  (LMP Unknown)   BMI 24.00 kg/m    Respiratory status: speaks in complete sentences without evident shortness of breath but sounds congested  Assessment/Plan:  Acute bacterial sinusitis Will treat for bacterial sinusitis given duration and progression of symptoms as well as immunocompromised status. Add nasal saline irrigation. Avoid nasal steroid given recent cataract/glaucoma surgery. Update if not improving with treatment.   Primary open angle glaucoma (POAG) of both eyes, severe stage Recent R eye surgery at [facility].   Kidney transplant status Continues tacrolimus, mycophenolate.   HTN (hypertension) BP elevated today - recently took decongestant. Advised avoiding decongestants from here on.    I discussed the assessment and treatment plan with the patient. The patient was provided an opportunity to ask questions and all were answered. The patient agreed with the plan and demonstrated an understanding of the instructions.  Lab Orders  No laboratory test(s) ordered today    Meds ordered this encounter  Medications  . amoxicillin-clavulanate (AUGMENTIN) 875-125 MG tablet    Sig: Take 1 tablet by mouth 2 (two) times daily for 10 days.    Dispense:  20 tablet    Refill:  0    The patient was advised to call back or seek an in-person evaluation if the symptoms worsen or if the condition fails to improve as anticipated.  Ria Bush, MD

## 2020-04-09 NOTE — Assessment & Plan Note (Signed)
Continues tacrolimus, mycophenolate.

## 2020-04-09 NOTE — Assessment & Plan Note (Signed)
Recent R eye surgery at [facility]

## 2020-06-03 NOTE — Telephone Encounter (Signed)
Called patient line was busy. Patient needs to be scheduled for cpe, awv and labs.

## 2020-06-10 ENCOUNTER — Telehealth: Payer: Self-pay

## 2020-06-10 NOTE — Telephone Encounter (Signed)
Pt said she has not been bothered with dizziness where room spins around in probably 5 years; the last 3 days when pt wakes up and turns over pt is dizzy and when sits up on side of bed the room spins around; pt then lays back down and is still until symptoms subside. Pt said the rest of the day and night pt is OK and then when wakes up the next morning same thing occurs for the last 3 mornings. Pt took BP on 06/08/20 BP was 150/69. Pt does not have H/A,CP or SOB. Pt wants refill of meclizine. I advised pt if would like could schedule appt with Dr Darnell Level on 06/11/20 and pt said she should not drive when dizzy and her husband just got home from hospital and he cannot drive. Pt will ck with pharmacy to get the OTC meclizine sent out and if does not get better pt will cb for appt. Pt is not dizzy right now. FYI to Dr Darnell Level.

## 2020-06-11 NOTE — Telephone Encounter (Signed)
Noted. Agree with this. Thank you.

## 2020-07-07 ENCOUNTER — Ambulatory Visit (INDEPENDENT_AMBULATORY_CARE_PROVIDER_SITE_OTHER): Payer: Medicare Other | Admitting: Family Medicine

## 2020-07-07 ENCOUNTER — Other Ambulatory Visit: Payer: Self-pay

## 2020-07-07 ENCOUNTER — Encounter: Payer: Self-pay | Admitting: Family Medicine

## 2020-07-07 VITALS — BP 132/78 | HR 64 | Temp 97.8°F | Ht 63.0 in | Wt 142.2 lb

## 2020-07-07 DIAGNOSIS — E785 Hyperlipidemia, unspecified: Secondary | ICD-10-CM

## 2020-07-07 DIAGNOSIS — Z940 Kidney transplant status: Secondary | ICD-10-CM

## 2020-07-07 DIAGNOSIS — I10 Essential (primary) hypertension: Secondary | ICD-10-CM

## 2020-07-07 DIAGNOSIS — E1121 Type 2 diabetes mellitus with diabetic nephropathy: Secondary | ICD-10-CM

## 2020-07-07 DIAGNOSIS — R42 Dizziness and giddiness: Secondary | ICD-10-CM | POA: Diagnosis not present

## 2020-07-07 DIAGNOSIS — Z23 Encounter for immunization: Secondary | ICD-10-CM | POA: Diagnosis not present

## 2020-07-07 NOTE — Patient Instructions (Addendum)
Flu shot today  I think you have positional vertigo (probably more on the right side) - try exercises provided today. I will also refer you to ENT to consider inner ear rehab.  Let me know sooner if worsening.   Benign Positional Vertigo Vertigo is the feeling that you or your surroundings are moving when they are not. Benign positional vertigo is the most common form of vertigo. This is usually a harmless condition (benign). This condition is positional. This means that symptoms are triggered by certain movements and positions. This condition can be dangerous if it occurs while you are doing something that could cause harm to you or others. This includes activities such as driving or operating machinery. What are the causes? In many cases, the cause of this condition is not known. It may be caused by a disturbance in an area of the inner ear that helps your brain to sense movement and balance. This disturbance can be caused by:  Viral infection (labyrinthitis).  Head injury.  Repetitive motion, such as jumping, dancing, or running. What increases the risk? You are more likely to develop this condition if:  You are a woman.  You are 60 years of age or older. What are the signs or symptoms? Symptoms of this condition usually happen when you move your head or your eyes in different directions. Symptoms may start suddenly, and usually last for less than a minute. They include:  Loss of balance and falling.  Feeling like you are spinning or moving.  Feeling like your surroundings are spinning or moving.  Nausea and vomiting.  Blurred vision.  Dizziness.  Involuntary eye movement (nystagmus). Symptoms can be mild and cause only minor problems, or they can be severe and interfere with daily life. Episodes of benign positional vertigo may return (recur) over time. Symptoms may improve over time. How is this diagnosed? This condition may be diagnosed based on:  Your medical  history.  Physical exam of the head, neck, and ears.  Tests, such as: ? MRI. ? CT scan. ? Eye movement tests. Your health care provider may ask you to change positions quickly while he or she watches you for symptoms of benign positional vertigo, such as nystagmus. Eye movement may be tested with a variety of exams that are designed to evaluate or stimulate vertigo. ? An electroencephalogram (EEG). This records electrical activity in your brain. ? Hearing tests. You may be referred to a health care provider who specializes in ear, nose, and throat (ENT) problems (otolaryngologist) or a provider who specializes in disorders of the nervous system (neurologist). How is this treated?  This condition may be treated in a session in which your health care provider moves your head in specific positions to adjust your inner ear back to normal. Treatment for this condition may take several sessions. Surgery may be needed in severe cases, but this is rare. In some cases, benign positional vertigo may resolve on its own in 2-4 weeks. Follow these instructions at home: Safety  Move slowly. Avoid sudden body or head movements or certain positions, as told by your health care provider.  Avoid driving until your health care provider says it is safe for you to do so.  Avoid operating heavy machinery until your health care provider says it is safe for you to do so.  Avoid doing any tasks that would be dangerous to you or others if vertigo occurs.  If you have trouble walking or keeping your balance, try using a  cane for stability. If you feel dizzy or unstable, sit down right away.  Return to your normal activities as told by your health care provider. Ask your health care provider what activities are safe for you. General instructions  Take over-the-counter and prescription medicines only as told by your health care provider.  Drink enough fluid to keep your urine pale yellow.  Keep all follow-up  visits as told by your health care provider. This is important. Contact a health care provider if:  You have a fever.  Your condition gets worse or you develop new symptoms.  Your family or friends notice any behavioral changes.  You have nausea or vomiting that gets worse.  You have numbness or a "pins and needles" sensation. Get help right away if you:  Have difficulty speaking or moving.  Are always dizzy.  Faint.  Develop severe headaches.  Have weakness in your legs or arms.  Have changes in your hearing or vision.  Develop a stiff neck.  Develop sensitivity to light. Summary  Vertigo is the feeling that you or your surroundings are moving when they are not. Benign positional vertigo is the most common form of vertigo.  The cause of this condition is not known. It may be caused by a disturbance in an area of the inner ear that helps your brain to sense movement and balance.  Symptoms include loss of balance and falling, feeling that you or your surroundings are moving, nausea and vomiting, and blurred vision.  This condition can be diagnosed based on symptoms, physical exam, and other tests, such as MRI, CT scan, eye movement tests, and hearing tests.  Follow safety instructions as told by your health care provider. You will also be told when to contact your health care provider in case of problems. This information is not intended to replace advice given to you by your health care provider. Make sure you discuss any questions you have with your health care provider. Document Revised: 02/27/2018 Document Reviewed: 02/27/2018 Elsevier Patient Education  [place].

## 2020-07-07 NOTE — Assessment & Plan Note (Signed)
Anticipate peripheral vertigo likely BPPV. Given duration will refer to ENT for further eval, consider vestibular rehab. In interim provided with modified epley handout to perform at home. She already is using meclizine at home.  If worsening, low threshold to check head imaging given comorbidities.

## 2020-07-07 NOTE — Progress Notes (Signed)
This visit was conducted in person.  BP 132/78 (BP Location: Right Arm, Patient Position: Sitting, Cuff Size: Normal)   Pulse 64   Temp 97.8 F (36.6 C) (Temporal)   Ht _0  (1.6 m)   Wt 142 lb 4 oz (64.5 kg)   LMP  (LMP Unknown)   SpO2 98%   BMI 25.20 kg/m    CC: "this vertigo has taken me over" Subjective:    Patient ID: Kristen Kirk, female    DOB: July 21, 1951, 69 y.o.   MRN: [id number]  HPI: Kristen Kirk is a 69 y.o. female presenting on 07/07/2020 for Dizziness (C/o vertigo worsening. )   Vertigo started last month, treated with meclizine without much benefit. Every morning when she awakens experiences vertigo when she sits on side of bed, then persists intermittently throughout the day. No known inciting event, movement. Each episode can last minutes to hours. Has vomited due vertigo. Did have a headache yesterday.   No tinnitus, hearing changes, slurred speech, unilateral numbness or weakness. No presyncope, syncope.  No preceding viral URI symptoms.  Remote vertigo history - had not had issue in years.   Caring for sick husband - having trouble with caregiving due to vertigo.  Received covid booster last month.      Relevant past medical, surgical, family and social history reviewed and updated as indicated. Interim medical history since our last visit reviewed. Allergies and medications reviewed and updated. Outpatient Medications Prior to Visit  Medication Sig Dispense Refill  . alendronate (FOSAMAX) 70 MG tablet Take 70 mg by mouth once a week. Take with a full glass of water on an empty stomach.    Marland Kitchen apraclonidine (IOPIDINE) 0.5 % ophthalmic solution Place 1 drop into the left eye 2 (two) times daily.    Marland Kitchen aspirin 81 MG tablet Take 81 mg by mouth daily.    . Blood Glucose Monitoring Suppl (ONE TOUCH ULTRA SYSTEM KIT) W/DEVICE KIT 1 kit by Does not apply route once. 1 each 0  . carvedilol (COREG) 25 MG tablet Take 1.5 tablets (37.5 mg total) by mouth 2  (two) times daily with a meal.    . Cholecalciferol (VITAMIN D3) 1000 units CAPS Take 2 capsules (2,000 Units total) by mouth daily. 60 capsule   . docusate sodium (COLACE) 100 MG capsule Take 100 mg by mouth 2 (two) times daily as needed.   0  . dorzolamide-timolol (COSOPT) 22.3-6.8 MG/ML ophthalmic solution Place 1 drop into both eyes 3 (three) times daily.      . famotidine (PEPCID) 20 MG tablet Take 1 tablet (20 mg total) by mouth 2 (two) times daily.    Marland Kitchen glucose blood test strip Check sugars once daily 100 each 11  . Melatonin 3 MG CAPS Take 1-2 capsules (3-6 mg total) by mouth at bedtime as needed (sleep).  0  . Multiple Vitamin (MULTIVITAMIN) tablet Take 1 tablet by mouth daily.      . mycophenolate (MYFORTIC) 180 MG EC tablet Take 2 tablets (360 mg total) by mouth 2 (two) times daily.    . pantoprazole (PROTONIX) 20 MG tablet TAKE 1 TABLET BY MOUTH EVERY DAY 90 tablet 0  . polyethylene glycol (MIRALAX / GLYCOLAX) packet Take 17 g by mouth daily as needed for moderate constipation. 14 each 0  . pravastatin (PRAVACHOL) 80 MG tablet TAKE 1 TABLET BY MOUTH  EVERY EVENING 90 tablet 0  . tacrolimus (PROGRAF) 0.5 MG capsule 2 tabs in am and 2 at  night    . travoprost, benzalkonium, (TRAVATAN) 0.004 % ophthalmic solution Place 1 drop into both eyes at bedtime.      . sodium bicarbonate 650 MG tablet Take 1 tablet (650 mg total) by mouth 2 (two) times daily.    Marland Kitchen tiZANidine (ZANAFLEX) 2 MG tablet TAKE 1 TABLET (2 MG TOTAL) BY MOUTH 2 (TWO) TIMES DAILY AS NEEDED FOR MUSCLE SPASMS (SEDATION PRECAUTIONS). 90 tablet 1   No facility-administered medications prior to visit.     Per HPI unless specifically indicated in ROS section below Review of Systems Objective:  BP 132/78 (BP Location: Right Arm, Patient Position: Sitting, Cuff Size: Normal)   Pulse 64   Temp 97.8 F (36.6 C) (Temporal)   Ht _0  (1.6 m)   Wt 142 lb 4 oz (64.5 kg)   LMP  (LMP Unknown)   SpO2 98%   BMI 25.20 kg/m   Wt  Readings from Last 3 Encounters:  07/07/20 142 lb 4 oz (64.5 kg)  04/09/20 135 lb 8 oz (61.5 kg)  01/05/20 148 lb 3 oz (67.2 kg)      Physical Exam Vitals and nursing note reviewed.  Constitutional:      Appearance: Normal appearance. She is not ill-appearing.  Neck:     Vascular: No carotid bruit.  Cardiovascular:     Rate and Rhythm: Normal rate and regular rhythm.     Pulses: Normal pulses.     Heart sounds: Normal heart sounds. No murmur heard.   Pulmonary:     Effort: Pulmonary effort is normal. No respiratory distress.     Breath sounds: Normal breath sounds. No wheezing, rhonchi or rales.  Neurological:     General: No focal deficit present.     Mental Status: She is alert.     Cranial Nerves: Cranial nerves are intact.     Sensory: Sensation is intact.     Motor: Motor function is intact.     Comments:  CN 2-12 intact FTN intact EOMI Neg [place] (some vertigo while sitting up after testing on right)  Cautious ambulation  Psychiatric:        Mood and Affect: Mood normal.       Results for orders placed or performed in visit on 10/20/19  POCT glycosylated hemoglobin (Hb A1C)  Result Value Ref Range   Hemoglobin A1C 6.8 (A) 4.0 - 5.6 %   HbA1c POC (<> result, manual entry)     HbA1c, POC (prediabetic range)     HbA1c, POC (controlled diabetic range)     Assessment & Plan:  This visit occurred during the SARS-CoV-2 public health emergency.  Safety protocols were in place, including screening questions prior to the visit, additional usage of staff PPE, and extensive cleaning of exam room while observing appropriate contact time as indicated for disinfecting solutions.   Problem List Items Addressed This Visit    Vertigo - Primary    Anticipate peripheral vertigo likely BPPV. Given duration will refer to ENT for further eval, consider vestibular rehab. In interim provided with modified epley handout to perform at home. She already is using meclizine at home.    If worsening, low threshold to check head imaging given comorbidities.       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  Orders Placed This Encounter  Procedures  . Flu Vaccine QUAD High Dose(Fluad)  . Ambulatory referral to ENT    Referral Priority:   Routine    Referral Type:   Consultation    Referral Reason:   Specialty Services Required    Requested Specialty:   Otolaryngology    Number of Visits Requested:   1    Patient instructions: Flu shot today  I think you have positional vertigo (probably more on the right side) - try exercises provided today. I will also refer you to ENT to consider inner ear rehab.  Let me know sooner if worsening.   Follow up plan: Return if symptoms worsen or fail to improve.  Ria Bush, MD

## 2020-07-14 ENCOUNTER — Other Ambulatory Visit: Payer: Self-pay | Admitting: Family Medicine

## 2020-07-14 NOTE — Telephone Encounter (Signed)
E-scribed refill.  Plz schedule wellness, cpe and lab visits.  

## 2020-07-19 ENCOUNTER — Telehealth: Payer: Self-pay | Admitting: Family Medicine

## 2020-07-19 NOTE — Telephone Encounter (Signed)
Please return pt's call in ref to her referral

## 2020-07-23 NOTE — Telephone Encounter (Signed)
ATC pt, no answer, no vmail  Per [facility] (Dr Vaught's office) is in the process of reviewing her referral and ov notes and will call her to schedule the appt once they determine an appt date/time

## 2020-07-26 NOTE — Telephone Encounter (Signed)
Patient is scheduled EM 

## 2020-08-06 NOTE — Telephone Encounter (Signed)
Pt already aware.  Pt being seen 08/06/20 Nothing further needed.

## 2020-09-21 ENCOUNTER — Other Ambulatory Visit: Payer: Self-pay | Admitting: Family Medicine

## 2020-09-21 ENCOUNTER — Other Ambulatory Visit: Payer: Self-pay

## 2020-09-21 ENCOUNTER — Ambulatory Visit: Payer: Medicare Other

## 2020-09-21 ENCOUNTER — Telehealth: Payer: Self-pay

## 2020-09-21 DIAGNOSIS — Z940 Kidney transplant status: Secondary | ICD-10-CM

## 2020-09-21 DIAGNOSIS — E785 Hyperlipidemia, unspecified: Secondary | ICD-10-CM

## 2020-09-21 DIAGNOSIS — E559 Vitamin D deficiency, unspecified: Secondary | ICD-10-CM

## 2020-09-21 DIAGNOSIS — E1121 Type 2 diabetes mellitus with diabetic nephropathy: Secondary | ICD-10-CM

## 2020-09-21 NOTE — Telephone Encounter (Signed)
Called patient 3 times trying to complete her AWV. Patient never answered the phone. No voicemail was available to leave message. Appointment was cancelled.

## 2020-09-22 ENCOUNTER — Other Ambulatory Visit: Payer: Medicare Other

## 2020-09-23 ENCOUNTER — Other Ambulatory Visit (INDEPENDENT_AMBULATORY_CARE_PROVIDER_SITE_OTHER): Payer: Medicare Other

## 2020-09-23 ENCOUNTER — Other Ambulatory Visit: Payer: Self-pay

## 2020-09-23 DIAGNOSIS — E559 Vitamin D deficiency, unspecified: Secondary | ICD-10-CM

## 2020-09-23 DIAGNOSIS — E1121 Type 2 diabetes mellitus with diabetic nephropathy: Secondary | ICD-10-CM | POA: Diagnosis not present

## 2020-09-23 DIAGNOSIS — Z940 Kidney transplant status: Secondary | ICD-10-CM

## 2020-09-23 DIAGNOSIS — E785 Hyperlipidemia, unspecified: Secondary | ICD-10-CM | POA: Diagnosis not present

## 2020-09-23 LAB — CBC WITH DIFFERENTIAL/PLATELET
Basophils Absolute: 0 10*3/uL (ref 0.0–0.1)
Basophils Relative: 0.8 % (ref 0.0–3.0)
Eosinophils Absolute: 0.2 10*3/uL (ref 0.0–0.7)
Eosinophils Relative: 3.6 % (ref 0.0–5.0)
HCT: 40.7 % (ref 36.0–46.0)
Hemoglobin: 12.9 g/dL (ref 12.0–15.0)
Lymphocytes Relative: 19.3 % (ref 12.0–46.0)
Lymphs Abs: 1.1 10*3/uL (ref 0.7–4.0)
MCHC: 31.8 g/dL (ref 30.0–36.0)
MCV: 84.6 fl (ref 78.0–100.0)
Monocytes Absolute: 0.7 10*3/uL (ref 0.1–1.0)
Monocytes Relative: 11.6 % (ref 3.0–12.0)
Neutro Abs: 3.7 10*3/uL (ref 1.4–7.7)
Neutrophils Relative %: 64.7 % (ref 43.0–77.0)
Platelets: 213 10*3/uL (ref 150.0–400.0)
RBC: 4.81 Mil/uL (ref 3.87–5.11)
RDW: 13.8 % (ref 11.5–15.5)
WBC: 5.7 10*3/uL (ref 4.0–10.5)

## 2020-09-23 LAB — COMPREHENSIVE METABOLIC PANEL
ALT: 17 U/L (ref 0–35)
AST: 16 U/L (ref 0–37)
Albumin: 4.3 g/dL (ref 3.5–5.2)
Alkaline Phosphatase: 74 U/L (ref 39–117)
BUN: 21 mg/dL (ref 6–23)
CO2: 21 mEq/L (ref 19–32)
Calcium: 9.7 mg/dL (ref 8.4–10.5)
Chloride: 108 mEq/L (ref 96–112)
Creatinine, Ser: 0.96 mg/dL (ref 0.40–1.20)
GFR: 60.98 mL/min (ref >60.00)
Glucose, Bld: 135 mg/dL — ABNORMAL HIGH (ref 70–99)
Potassium: 3.9 mEq/L (ref 3.5–5.1)
Sodium: 139 mEq/L (ref 135–145)
Total Bilirubin: 0.4 mg/dL (ref 0.2–1.2)
Total Protein: 7.3 g/dL (ref 6.0–8.3)

## 2020-09-23 LAB — LIPID PANEL
Cholesterol: 142 mg/dL (ref 0–200)
HDL: 45.7 mg/dL (ref >39.00)
LDL Cholesterol: 81 mg/dL (ref 0–99)
NonHDL: 96.08
Total CHOL/HDL Ratio: 3
Triglycerides: 73 mg/dL (ref 0.0–149.0)
VLDL: 14.6 mg/dL (ref 0.0–40.0)

## 2020-09-23 LAB — MICROALBUMIN / CREATININE URINE RATIO
Creatinine,U: 109.4 mg/dL
Microalb Creat Ratio: 1.8 mg/g (ref 0.0–30.0)
Microalb, Ur: 1.9 mg/dL (ref 0.0–1.9)

## 2020-09-23 LAB — HEMOGLOBIN A1C: Hgb A1c MFr Bld: 7.4 % — ABNORMAL HIGH (ref 4.6–6.5)

## 2020-09-23 LAB — VITAMIN D 25 HYDROXY (VIT D DEFICIENCY, FRACTURES): VITD: 47.69 ng/mL (ref 30.00–100.00)

## 2020-09-23 NOTE — Addendum Note (Signed)
Addended by: Cloyd Stagers on: 09/23/2020 09:10 AM   Modules accepted: Orders

## 2020-09-29 ENCOUNTER — Ambulatory Visit (INDEPENDENT_AMBULATORY_CARE_PROVIDER_SITE_OTHER): Payer: Medicare Other | Admitting: Family Medicine

## 2020-09-29 ENCOUNTER — Encounter: Payer: Self-pay | Admitting: Family Medicine

## 2020-09-29 ENCOUNTER — Other Ambulatory Visit: Payer: Self-pay

## 2020-09-29 VITALS — BP 120/68 | HR 71 | Temp 97.7°F | Ht 61.5 in | Wt 144.1 lb

## 2020-09-29 DIAGNOSIS — Z0000 Encounter for general adult medical examination without abnormal findings: Secondary | ICD-10-CM | POA: Diagnosis not present

## 2020-09-29 DIAGNOSIS — H401133 Primary open-angle glaucoma, bilateral, severe stage: Secondary | ICD-10-CM | POA: Diagnosis not present

## 2020-09-29 DIAGNOSIS — R42 Dizziness and giddiness: Secondary | ICD-10-CM

## 2020-09-29 DIAGNOSIS — K5900 Constipation, unspecified: Secondary | ICD-10-CM

## 2020-09-29 DIAGNOSIS — K219 Gastro-esophageal reflux disease without esophagitis: Secondary | ICD-10-CM

## 2020-09-29 DIAGNOSIS — G8929 Other chronic pain: Secondary | ICD-10-CM

## 2020-09-29 DIAGNOSIS — M5442 Lumbago with sciatica, left side: Secondary | ICD-10-CM

## 2020-09-29 DIAGNOSIS — E559 Vitamin D deficiency, unspecified: Secondary | ICD-10-CM | POA: Diagnosis not present

## 2020-09-29 DIAGNOSIS — E785 Hyperlipidemia, unspecified: Secondary | ICD-10-CM

## 2020-09-29 DIAGNOSIS — R159 Full incontinence of feces: Secondary | ICD-10-CM

## 2020-09-29 DIAGNOSIS — Z7189 Other specified counseling: Secondary | ICD-10-CM | POA: Diagnosis not present

## 2020-09-29 DIAGNOSIS — R87610 Atypical squamous cells of undetermined significance on cytologic smear of cervix (ASC-US): Secondary | ICD-10-CM | POA: Diagnosis not present

## 2020-09-29 DIAGNOSIS — E1169 Type 2 diabetes mellitus with other specified complication: Secondary | ICD-10-CM

## 2020-09-29 DIAGNOSIS — Z23 Encounter for immunization: Secondary | ICD-10-CM

## 2020-09-29 DIAGNOSIS — Z940 Kidney transplant status: Secondary | ICD-10-CM

## 2020-09-29 DIAGNOSIS — M818 Other osteoporosis without current pathological fracture: Secondary | ICD-10-CM

## 2020-09-29 DIAGNOSIS — N3946 Mixed incontinence: Secondary | ICD-10-CM

## 2020-09-29 DIAGNOSIS — I10 Essential (primary) hypertension: Secondary | ICD-10-CM

## 2020-09-29 DIAGNOSIS — E1121 Type 2 diabetes mellitus with diabetic nephropathy: Secondary | ICD-10-CM

## 2020-09-29 MED ORDER — POLYETHYLENE GLYCOL 3350 17 GM/SCOOP PO POWD: 8.5000 g | Freq: Every day | ORAL | 1 refills | Status: DC

## 2020-09-29 NOTE — Progress Notes (Signed)
Patient ID: Kristen Kirk, female    DOB: Mar 09, 1951, 69 y.o.   MRN: [id number]  This visit was conducted in person.  BP 120/68 (BP Location: Left Arm, Patient Position: Sitting, Cuff Size: Normal)   Pulse 71   Temp 97.7 F (36.5 C) (Temporal)   Ht 5' 1.5" (1.562 m)   Wt 144 lb 1 oz (65.3 kg)   LMP  (LMP Unknown)   SpO2 98%   BMI 26.78 kg/m    CC: AMW Subjective:   HPI: Kristen Kirk is a 69 y.o. female presenting on 09/29/2020 for Medicare Wellness   Did not speak with health advisor this year.    Hearing Screening   '125Hz'  '250Hz'  '500Hz'  '1000Hz'  '2000Hz'  '3000Hz'  '4000Hz'  '6000Hz'  '8000Hz'   Right ear:   40 40 40  40    Left ear:   40 40 20  25    Vision Screening Comments: Last eye exam, 09/2020.  [place] Office Visit from 09/29/2020 in [place] at [place]  PHQ-2 Total Score 0      Fall Risk  09/29/2020 03/20/2019 03/02/2014  Falls in the past year? 0 0 No    Kidney transplant 04/2017. On prograf and mycophenolate. Goal BP <130/80.   L sciatica s/p eval by SM, PT. IM dexamethasone caused marked BP elevation but helped symptoms. Has HEP. Failed conservative management. Xrays showed DDD and facet joint arthritis, spinal stenosis - s/p MRI and PM&R eval ([facility]) - s/p L L5/S1 TF ESI x1 04/2020 with significant improvmeent.   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 No significant tinnitus or hearing loss.   Notes worsening fecal incontinence - ongoing for years, but progressively worsening - no control worse after any meal. She uses pad for urine incontinence. Has seen GI (Olevia Perches2012) - thought constipation related rec small amt miralax daily, saw urogyn who recommended increased fiber in diet. Alternating loose stools with constipation.   Preventative: Colonoscopy - normal 2010 (Olevia Perches.rpt 10 yrs.  COLONOSCOPY 03/2019 - diverticulosis, rpt 10 yrs  (Nandigam) Pap normal 07/2011, 08/2014 WNL. Pap 03/2019 - ASCUS, HPV neg. Consider rpt 3 yrs, would be final check.  [facility] DEXA 04/2019 - 1.5 spine, -2.1 hip, -2.5 forearm - osteoporosis - weekly fosamax started by renal 2021  Lung cancer screen - quit smoking ~1987 Flu shot yearly.  CLa Tour2/2021, 12/2019, 06/2020  Tetanus 2009.  Pneumovax 2013. prevnar 2020 Shingrix - 07/2018, due for second ([facility]  Advanced directive - does not have this yet. Packet provided today. HCPOA would be husband and sister.  Seat belt use discussed  Sunscreen use discussed. No changing moles on skin  Non smoker Alcohol - none Dentist q6 mo  Eye exam yearly - known severe bilateral POAG.  Bowel - see HPI Bladder - chronic urge incontinence previously followed by uro s/p PTNS - now on myrbetriq with benefit during the day, worse symptoms at night time. Has tried multiple other treatments.   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 Interim medical history since our last visit reviewed. Allergies and medications reviewed and updated. Outpatient Medications Prior  to Visit  Medication Sig Dispense Refill  . alendronate (FOSAMAX) 70 MG tablet Take 70 mg by mouth once a week. Take with a full glass of water on an empty stomach.    Marland Kitchen apraclonidine (IOPIDINE) 0.5 % ophthalmic solution Place 1 drop into the left eye 2 (two) times daily.    Marland Kitchen aspirin 81 MG tablet Take 81 mg by mouth daily.    . Blood Glucose Monitoring Suppl (ONE TOUCH ULTRA SYSTEM KIT) W/DEVICE KIT 1 kit by Does not apply route once. 1 each 0  . carvedilol (COREG) 25 MG tablet Take 1.5 tablets (37.5 mg total) by mouth 2 (two) times daily  with a meal.    . Cholecalciferol (VITAMIN D3) 1000 units CAPS Take 2 capsules (2,000 Units total) by mouth daily. 60 capsule   . docusate sodium (COLACE) 100 MG capsule Take 100 mg by mouth 2 (two) times daily as needed.   0  . dorzolamide-timolol (COSOPT) 22.3-6.8 MG/ML ophthalmic solution Place 1 drop into both eyes 3 (three) times daily.    . famotidine (PEPCID) 20 MG tablet Take 1 tablet (20 mg total) by mouth 2 (two) times daily.    Marland Kitchen glucose blood test strip Check sugars once daily 100 each 11  . Melatonin 3 MG CAPS Take 1-2 capsules (3-6 mg total) by mouth at bedtime as needed (sleep).  0  . Multiple Vitamin (MULTIVITAMIN) tablet Take 1 tablet by mouth daily.    . mycophenolate (MYFORTIC) 180 MG EC tablet Take 2 tablets (360 mg total) by mouth 2 (two) times daily.    . pantoprazole (PROTONIX) 20 MG tablet TAKE 1 TABLET BY MOUTH EVERY DAY 90 tablet 0  . pravastatin (PRAVACHOL) 80 MG tablet TAKE 1 TABLET BY MOUTH  EVERY EVENING 90 tablet 0  . tacrolimus (PROGRAF) 0.5 MG capsule 2 tabs in am and 2 at night    . travoprost, benzalkonium, (TRAVATAN) 0.004 % ophthalmic solution Place 1 drop into both eyes at bedtime.    . polyethylene glycol (MIRALAX / GLYCOLAX) packet Take 17 g by mouth daily as needed for moderate constipation. 14 each 0  . hydrochlorothiazide (HYDRODIURIL) 12.5 MG tablet Take 12.5 mg by mouth daily.     No facility-administered medications prior to visit.     Per HPI unless specifically indicated in ROS section below Review of Systems Objective:  BP 120/68 (BP Location: Left Arm, Patient Position: Sitting, Cuff Size: Normal)   Pulse 71   Temp 97.7 F (36.5 C) (Temporal)   Ht 5' 1.5" (1.562 m)   Wt 144 lb 1 oz (65.3 kg)   LMP  (LMP Unknown)   SpO2 98%   BMI 26.78 kg/m   Wt Readings from Last 3 Encounters:  09/29/20 144 lb 1 oz (65.3 kg)  07/07/20 142 lb 4 oz (64.5 kg)  04/09/20 135 lb 8 oz (61.5 kg)      Physical Exam Vitals and nursing note reviewed.   Constitutional:      General: She is not in acute distress.    Appearance: Normal appearance. She is well-developed and well-nourished. She is not ill-appearing.  HENT:     Head: Normocephalic and atraumatic.     Right Ear: Hearing, tympanic membrane, ear canal and external ear normal.     Left Ear: Hearing, tympanic membrane, ear canal and external ear normal.     Mouth/Throat:     Mouth: Oropharynx is clear and moist and mucous membranes are normal.     Pharynx:  No posterior oropharyngeal edema.  Eyes:     General: No scleral icterus.    Extraocular Movements: EOM normal.     Conjunctiva/sclera: Conjunctivae normal.     Pupils: Pupils are equal, round, and reactive to light.  Neck:     Thyroid: No thyroid mass or thyromegaly.     Vascular: No carotid bruit.  Cardiovascular:     Rate and Rhythm: Normal rate and regular rhythm.     Pulses: Normal pulses and intact distal pulses.          Radial pulses are 2+ on the right side and 2+ on the left side.     Heart sounds: Normal heart sounds. No murmur heard.   Pulmonary:     Effort: Pulmonary effort is normal. No respiratory distress.     Breath sounds: Normal breath sounds. No wheezing, rhonchi or rales.  Abdominal:     General: Abdomen is flat. Bowel sounds are normal. There is no distension.     Palpations: Abdomen is soft. There is no mass.     Tenderness: There is no abdominal tenderness. There is no guarding or rebound.     Hernia: No hernia is present.  Musculoskeletal:        General: No edema. Normal range of motion.     Cervical back: Normal range of motion and neck supple.     Right lower leg: No edema.     Left lower leg: No edema.  Lymphadenopathy:     Cervical: No cervical adenopathy.  Skin:    General: Skin is warm and dry.     Findings: No rash.  Neurological:     General: No focal deficit present.     Mental Status: She is alert and oriented to person, place, and time.     Comments:  CN grossly intact,  station and gait intact Recall 2/3, 2/3 with cue Calculation DLROW 5/5  Psychiatric:        Mood and Affect: Mood and affect and mood normal.        Behavior: Behavior normal.        Thought Content: Thought content normal.        Judgment: Judgment normal.       Results for orders placed or performed in visit on 09/23/20  CBC with Differential/Platelet  Result Value Ref Range   WBC 5.7 4.0 - 10.5 K/uL   RBC 4.81 3.87 - 5.11 Mil/uL   Hemoglobin 12.9 12.0 - 15.0 g/dL   HCT 40.7 36.0 - 46.0 %   MCV 84.6 78.0 - 100.0 fl   MCHC 31.8 30.0 - 36.0 g/dL   RDW 13.8 11.5 - 15.5 %   Platelets 213.0 150.0 - 400.0 K/uL   Neutrophils Relative % 64.7 43.0 - 77.0 %   Lymphocytes Relative 19.3 12.0 - 46.0 %   Monocytes Relative 11.6 3.0 - 12.0 %   Eosinophils Relative 3.6 0.0 - 5.0 %   Basophils Relative 0.8 0.0 - 3.0 %   Neutro Abs 3.7 1.4 - 7.7 K/uL   Lymphs Abs 1.1 0.7 - 4.0 K/uL   Monocytes Absolute 0.7 0.1 - 1.0 K/uL   Eosinophils Absolute 0.2 0.0 - 0.7 K/uL   Basophils Absolute 0.0 0.0 - 0.1 K/uL  Hemoglobin A1c  Result Value Ref Range   Hgb A1c MFr Bld 7.4 (H) 4.6 - 6.5 %  Comprehensive metabolic panel  Result Value Ref Range   Sodium 139 135 - 145 mEq/L   Potassium 3.9  3.5 - 5.1 mEq/L   Chloride 108 96 - 112 mEq/L   CO2 21 19 - 32 mEq/L   Glucose, Bld 135 (H) 70 - 99 mg/dL   BUN 21 6 - 23 mg/dL   Creatinine, Ser 0.96 0.40 - 1.20 mg/dL   Total Bilirubin 0.4 0.2 - 1.2 mg/dL   Alkaline Phosphatase 74 39 - 117 U/L   AST 16 0 - 37 U/L   ALT 17 0 - 35 U/L   Total Protein 7.3 6.0 - 8.3 g/dL   Albumin 4.3 3.5 - 5.2 g/dL   GFR 60.98 >60.00 mL/min   Calcium 9.7 8.4 - 10.5 mg/dL  Lipid panel  Result Value Ref Range   Cholesterol 142 0 - 200 mg/dL   Triglycerides 73.0 0.0 - 149.0 mg/dL   HDL 45.70 >39.00 mg/dL   VLDL 14.6 0.0 - 40.0 mg/dL   LDL Cholesterol 81 0 - 99 mg/dL   Total CHOL/HDL Ratio 3    NonHDL 96.08   VITAMIN D 25 Hydroxy (Vit-D Deficiency, Fractures)  Result  Value Ref Range   VITD 47.69 30.00 - 100.00 ng/mL  Microalbumin / creatinine urine ratio  Result Value Ref Range   Microalb, Ur 1.9 0.0 - 1.9 mg/dL   Creatinine,U 109.4 mg/dL   Microalb Creat Ratio 1.8 0.0 - 30.0 mg/g   Lumbar MRI 01/2020: L1/2 new L foraminal disc protrusion; L2/3 progressed R subarticular to R extraforaminal disc protrusion crowding R L3 nerve root; L3/4 progressed central disc protrusion and B subarticular and central canal stenosis and B neural foraminal narrowing; L4/5 progressive severe B subarticular and central canal stenosis, neural foraminal narrowing; L5/S1 small central disc protrusion, enlarged L 20m synovial facet cyst causing mod L subarticular stenosis with crowding of L S1 nerve root, B neural foraminal stenosis. Progressive lumbar DDD Assessment & Plan:  This visit occurred during the SARS-CoV-2 public health emergency.  Safety protocols were in place, including screening questions prior to the visit, additional usage of staff PPE, and extensive cleaning of exam room while observing appropriate contact time as indicated for disinfecting solutions.   Problem List Items Addressed This Visit    Vitamin D deficiency    Continue daily replacement (2000 IU daily)      Vertigo    Recent ENT eval - possible meniere's with significant improvement since starting hctz 12.52mdaily.       Primary open angle glaucoma (POAG) of both eyes, severe stage    Continues regular eye exams, on travatan      Osteoporosis    Fosamax started 2021 for mild osteoporosis.       Mixed incontinence urge and stress    Ongoing struggle. Has seen urology latest earlier this year, s/p PTNS. Continues myrbetriq.       Medicare annual wellness visit, subsequent - Primary    I have personally reviewed the Medicare Annual Wellness questionnaire and have noted 1. The patient's medical and social history 2. Their use of alcohol, tobacco or illicit drugs 3. Their current medications and  supplements 4. The patient's functional ability including ADL's, fall risks, home safety risks and hearing or visual impairment. Cognitive function has been assessed and addressed as indicated.  5. Diet and physical activity 6. Evidence for depression or mood disorders The patients weight, height, BMI have been recorded in the chart. I have made referrals, counseling and provided education to the patient based on review of the above and I have provided the pt with a written personalized  care plan for preventive services. Provider list updated.. See scanned questionairre as needed for further documentation. Reviewed preventative protocols and updated unless pt declined.       Kidney transplant status    Continues mycophenolate and prograf Appreciate transplant clinic care.       Hyperlipidemia associated with type 2 diabetes mellitus (HCC)    Chronic, stable, continue pravastatin. The 10-year ASCVD risk score Mikey Bussing DC Brooke Bonito., et al., 2013) is: 15.9%   Values used to calculate the score:     Age: 75 years     Sex: Female     Is Non-Hispanic African American: Yes     Diabetic: Yes     Tobacco smoker: No     Systolic Blood Pressure: 945 mmHg     Is BP treated: Yes     HDL Cholesterol: 45.7 mg/dL     Total Cholesterol: 142 mg/dL       HTN (hypertension)    Chronic, stable continue current regimen.       Relevant Medications   hydrochlorothiazide (HYDRODIURIL) 12.5 MG tablet   GERD (gastroesophageal reflux disease)    Managed with daily pepcid and protonix 73m       Relevant Medications   polyethylene glycol powder (GLYCOLAX/MIRALAX) 17 GM/SCOOP powder   Fecal incontinence alternating with constipation    Ongoing, worsening. This is activity limiting.   Recommend 1/2 capful miralax daily.  Will refer back to GI for further eval/management.       Relevant Orders   Ambulatory referral to Gastroenterology   Controlled type 2 diabetes mellitus with diabetic nephropathy (HSo-Hi     Chronic, deteriorated with A1c >7%.  Encouraged renewed efforts at diabetic diet - she is motivated for this.  She will return in 3 months for DM recheck.       Chronic lower back pain    Significant benefit after left L5/S1 TF ESI x1. Appreciate PM&R care 04/2020      ASCUS of cervix with negative high risk HPV    Reviewed with patient pap results from 03/2019. Given normal prior exams, discussed rpt 3 yrs, would likely be final pap.       Advanced care planning/counseling discussion    Advanced directive - does not have this yet. Packet provided today. HCPOA would be husband and sister.           Meds ordered this encounter  Medications  . polyethylene glycol powder (GLYCOLAX/MIRALAX) 17 GM/SCOOP powder    Sig: Take 8.5 g by mouth daily. Hold for diarrhea    Dispense:  3350 g    Refill:  1   Orders Placed This Encounter  Procedures  . Pneumococcal polysaccharide vaccine 23-valent greater than or equal to 2yo subcutaneous/IM  . Ambulatory referral to Gastroenterology    Referral Priority:   Routine    Referral Type:   Consultation    Referral Reason:   Specialty Services Required    Number of Visits Requested:   1    Patient instructions: Pneumovax today.  [facility] ([facility]ingrix) it looks like you've only had 1 out of 2 series (07/03/2018).  Start 1/2 capful miralax daily and we will refer you to GI for further evaluation of stools.  [facility]([phone] were too high - work on low sugar low carb diet, return in 3 months for diabetes check.  Work on aEcologist  bring [place] copy when done.  Good to see you today.   Follow up plan: Return in about 6 months (around 03/30/2021) for follow up visit.  Ria Bush, MD

## 2020-09-29 NOTE — Patient Instructions (Addendum)
Pneumovax today.  Check with [facility] or local pharmacy about shingles shots (shingrix) it looks like you've only had 1 out of 2 series (07/03/2018).  Start 1/2 capful miralax daily and we will refer you to GI for further evaluation of stools.  Call to schedule mammogram at your convenience: [facility] at [facility] [phone] Sugars were too high - work on low sugar low carb diet, return in 3 months for diabetes check.  Work on Ecologist, bring [place] copy when done.  Good to see you today.   Health Maintenance After Age 68 After age 16, you are at a higher risk for certain long-term diseases and infections as well as injuries from falls. Falls are a major cause of broken bones and head injuries in people who are older than age 35. Getting regular preventive care can help to keep you healthy and well. Preventive care includes getting regular testing and making lifestyle changes as recommended by your health care provider. Talk with your health care provider about:  Which screenings and tests you should have. A screening is a test that checks for a disease when you have no symptoms.  A diet and exercise plan that is right for you. What should I know about screenings and tests to prevent falls? Screening and testing are the best ways to find a health problem early. Early diagnosis and treatment give you the best chance of managing medical conditions that are common after age 55. Certain conditions and lifestyle choices may make you more likely to have a fall. Your health care provider may recommend:  Regular vision checks. Poor vision and conditions such as cataracts can make you more likely to have a fall. If you wear glasses, make sure to get your prescription updated if your vision changes.  Medicine review. Work with your health care provider to regularly review all of the medicines you are taking, including over-the-counter medicines. Ask your health care provider about any  side effects that may make you more likely to have a fall. Tell your health care provider if any medicines that you take make you feel dizzy or sleepy.  Osteoporosis screening. Osteoporosis is a condition that causes the bones to get weaker. This can make the bones weak and cause them to break more easily.  Blood pressure screening. Blood pressure changes and medicines to control blood pressure can make you feel dizzy.  Strength and balance checks. Your health care provider may recommend certain tests to check your strength and balance while standing, walking, or changing positions.  Foot health exam. Foot pain and numbness, as well as not wearing proper footwear, can make you more likely to have a fall.  Depression screening. You may be more likely to have a fall if you have a fear of falling, feel emotionally low, or feel unable to do activities that you used to do.  Alcohol use screening. Using too much alcohol can affect your balance and may make you more likely to have a fall. What actions can I take to lower my risk of falls? General instructions  Talk with your health care provider about your risks for falling. Tell your health care provider if: ? You fall. Be sure to tell your health care provider about all falls, even ones that seem minor. ? You feel dizzy, sleepy, or off-balance.  Take over-the-counter and prescription medicines only as told by your health care provider. These include any supplements.  Eat a healthy diet and maintain a  healthy weight. A healthy diet includes low-fat dairy products, low-fat (lean) meats, and fiber from whole grains, beans, and lots of fruits and vegetables. Home safety  Remove any tripping hazards, such as rugs, cords, and clutter.  Install safety equipment such as grab bars in bathrooms and safety rails on stairs.  Keep rooms and walkways well-lit. Activity   Follow a regular exercise program to stay fit. This will help you maintain your  balance. Ask your health care provider what types of exercise are appropriate for you.  If you need a cane or walker, use it as recommended by your health care provider.  Wear supportive shoes that have nonskid soles. Lifestyle  Do not drink alcohol if your health care provider tells you not to drink.  If you drink alcohol, limit how much you have: ? 0-1 drink a day for women. ? 0-2 drinks a day for men.  Be aware of how much alcohol is in your drink. In the [place] one drink equals one typical bottle of beer (12 oz), one-half glass of wine (5 oz), or one shot of hard liquor (1 oz).  Do not use any products that contain nicotine or tobacco, such as cigarettes and e-cigarettes. If you need help quitting, ask your health care provider. Summary  Having a healthy lifestyle and getting preventive care can help to protect your health and wellness after age 52.  Screening and testing are the best way to find a health problem early and help you avoid having a fall. Early diagnosis and treatment give you the best chance for managing medical conditions that are more common for people who are older than age 61.  Falls are a major cause of broken bones and head injuries in people who are older than age 39. Take precautions to prevent a fall at home.  Work with your health care provider to learn what changes you can make to improve your health and wellness and to prevent falls. This information is not intended to replace advice given to you by your health care provider. Make sure you discuss any questions you have with your health care provider. Document Revised: 01/09/2019 Document Reviewed: 08/01/2017 Elsevier Patient Education  2020 [employer].

## 2020-10-01 DIAGNOSIS — R87610 Atypical squamous cells of undetermined significance on cytologic smear of cervix (ASC-US): Secondary | ICD-10-CM | POA: Insufficient documentation

## 2020-10-01 NOTE — Assessment & Plan Note (Signed)
Continue daily replacement (2000 IU daily)

## 2020-10-01 NOTE — Assessment & Plan Note (Signed)
Chronic, stable continue current regimen.  

## 2020-10-01 NOTE — Assessment & Plan Note (Addendum)
Significant benefit after left L5/S1 TF ESI x1. Appreciate PM&R care 04/2020

## 2020-10-01 NOTE — Assessment & Plan Note (Signed)
Managed with daily pepcid and protonix 20mg 

## 2020-10-01 NOTE — Assessment & Plan Note (Signed)
Reviewed with patient pap results from 03/2019. Given normal prior exams, discussed rpt 3 yrs, would likely be final pap.

## 2020-10-01 NOTE — Assessment & Plan Note (Signed)
Advanced directive - does not have this yet. Packet provided today. HCPOA would be husband and sister.

## 2020-10-01 NOTE — Assessment & Plan Note (Addendum)
Chronic, stable, continue pravastatin. The 10-year ASCVD risk score Mikey Bussing DC Brooke Bonito., et al., 2013) is: 15.9%   Values used to calculate the score:     Age: 69 years     Sex: Female     Is Non-Hispanic African American: Yes     Diabetic: Yes     Tobacco smoker: No     Systolic Blood Pressure: 445 mmHg     Is BP treated: Yes     HDL Cholesterol: 45.7 mg/dL     Total Cholesterol: 142 mg/dL

## 2020-10-01 NOTE — Assessment & Plan Note (Signed)

## 2020-10-01 NOTE — Assessment & Plan Note (Signed)
Recent ENT eval - possible meniere's with significant improvement since starting hctz 12.5mg  daily.

## 2020-10-01 NOTE — Assessment & Plan Note (Signed)
Continues mycophenolate and prograf Appreciate transplant clinic care.

## 2020-10-01 NOTE — Assessment & Plan Note (Addendum)
Continues regular eye exams, on travatan

## 2020-10-01 NOTE — Assessment & Plan Note (Addendum)
Ongoing struggle. Has seen urology latest earlier this year, s/p PTNS. Continues myrbetriq.

## 2020-10-01 NOTE — Assessment & Plan Note (Addendum)
Ongoing, worsening. This is activity limiting.   Recommend 1/2 capful miralax daily.  Will refer back to GI for further eval/management.

## 2020-10-01 NOTE — Assessment & Plan Note (Addendum)
Chronic, deteriorated with A1c >7%.  Encouraged renewed efforts at diabetic diet - she is motivated for this.  She will return in 3 months for DM recheck.

## 2020-10-01 NOTE — Assessment & Plan Note (Signed)
Fosamax started 2021 for mild osteoporosis.

## 2020-10-05 DIAGNOSIS — H8101 Meniere's disease, right ear: Secondary | ICD-10-CM | POA: Diagnosis not present

## 2020-10-05 DIAGNOSIS — H903 Sensorineural hearing loss, bilateral: Secondary | ICD-10-CM | POA: Diagnosis not present

## 2020-10-05 DIAGNOSIS — H6063 Unspecified chronic otitis externa, bilateral: Secondary | ICD-10-CM | POA: Diagnosis not present

## 2020-10-12 ENCOUNTER — Telehealth: Payer: Self-pay | Admitting: Family Medicine

## 2020-10-12 ENCOUNTER — Other Ambulatory Visit: Payer: Self-pay | Admitting: Family Medicine

## 2020-10-12 DIAGNOSIS — Z1231 Encounter for screening mammogram for malignant neoplasm of breast: Secondary | ICD-10-CM

## 2020-10-12 NOTE — Telephone Encounter (Signed)
Patient called in stating she was told to schedule her 2nd shingles shot. Does not remember when she had first. Records indicate that last dose given in 2019. Would it be safe to give 2nd dose or would the series need to restart. Please advise.

## 2020-10-13 NOTE — Telephone Encounter (Signed)
Contacted [employer] vaccine representative who advised it is ok for the pt to proceed with the second shingrix vaccine and the pt does not need to start the series over. Only the second vaccine is needed.

## 2020-10-13 NOTE — Telephone Encounter (Signed)
Patient has been scheduled

## 2020-10-25 ENCOUNTER — Telehealth: Payer: Self-pay

## 2020-10-25 DIAGNOSIS — Z940 Kidney transplant status: Secondary | ICD-10-CM | POA: Diagnosis not present

## 2020-10-25 NOTE — Telephone Encounter (Signed)
She can also take corcedin for cold symptoms and/or antihistamine like claritin or zyrtec.  Let us know if worsening instead of improving symptoms.

## 2020-10-25 NOTE — Telephone Encounter (Signed)
Called pt and advised. Pt confirmed. -JMA

## 2020-10-25 NOTE — Telephone Encounter (Signed)
Patient called in and stated that for the past week, she has been experiencing nasal congestion and cough. Patient stated that she went to [facility] and got a COVID test, which was negative. Patient denies SOB, chest pain, fever, or sore throat. Patient states that she has been taking Tylenol, but wanted to know what else she could take OTC that was safe D/T her history of renal disease? Please advise.

## 2020-10-28 ENCOUNTER — Ambulatory Visit: Payer: BLUE CROSS/BLUE SHIELD

## 2020-10-29 DIAGNOSIS — H401133 Primary open-angle glaucoma, bilateral, severe stage: Secondary | ICD-10-CM | POA: Diagnosis not present

## 2020-10-29 DIAGNOSIS — H401123 Primary open-angle glaucoma, left eye, severe stage: Secondary | ICD-10-CM | POA: Diagnosis not present

## 2020-11-01 ENCOUNTER — Other Ambulatory Visit: Payer: Self-pay

## 2020-11-01 ENCOUNTER — Ambulatory Visit (INDEPENDENT_AMBULATORY_CARE_PROVIDER_SITE_OTHER): Payer: Medicare Other | Admitting: Physician Assistant

## 2020-11-01 ENCOUNTER — Encounter: Payer: Self-pay | Admitting: Physician Assistant

## 2020-11-01 ENCOUNTER — Ambulatory Visit (INDEPENDENT_AMBULATORY_CARE_PROVIDER_SITE_OTHER)
Admission: RE | Admit: 2020-11-01 | Discharge: 2020-11-01 | Disposition: A | Discharge status: Home / Self Care | Payer: Medicare Other | Source: Ambulatory Visit | Attending: Physician Assistant | Admitting: Physician Assistant

## 2020-11-01 VITALS — BP 118/62 | HR 72 | Ht 63.0 in | Wt 144.0 lb

## 2020-11-01 DIAGNOSIS — R159 Full incontinence of feces: Secondary | ICD-10-CM | POA: Diagnosis not present

## 2020-11-01 DIAGNOSIS — R150 Incomplete defecation: Secondary | ICD-10-CM | POA: Diagnosis not present

## 2020-11-01 NOTE — Patient Instructions (Addendum)
If you are age 69 or older, your body mass index should be between 23-30. Your Body mass index is 25.51 kg/m. If this is out of the aforementioned range listed, please consider follow up with your Primary Care Provider.  If you are age 47 or younger, your body mass index should be between 19-25. Your Body mass index is 25.51 kg/m. If this is out of the aformentioned range listed, please consider follow up with your Primary Care Provider.   Your provider has requested that you have an abdominal x ray before leaving today. Please go to the basement floor to our Radiology department for the test.  Please start fiber supplement metamucil, citracel or Benefiber once daily.  Due to recent changes in healthcare laws, you may see the results of your imaging and laboratory studies on MyChart before your provider has had a chance to review them.  We understand that in some cases there may be results that are confusing or concerning to you. Not all laboratory results come back in the same time frame and the provider may be waiting for multiple results in order to interpret others.  Please give [place] 48 hours in order for your provider to thoroughly review all the results before contacting the office for clarification of your results.   Thank you for choosing me and [facility].  Ellouise Newer, PA-C

## 2020-11-01 NOTE — Progress Notes (Signed)
Chief Complaint: Fecal incontinence  HPI:    Kristen Kirk is a 70 year old African-American female with past medical history of diabetes, ESRD on dialysis status post kidney transplant 04/2017, GERD and multiple others listed below, known to Dr. Silverio Decamp, who was referred to me by Ria Bush, MD for a complaint of fecal incontinence.      03/25/2019 colonoscopy done for screening with diverticulosis in the sigmoid colon and in the descending colon, nonbleeding internal hemorrhoids and otherwise normal.  Repeat recommended in 10 years.    09/29/2020 patient saw PCP for her annual visit and described worsening fecal incontinence which had been ongoing for years but progressively getting worse.  Apparently was seen by [place] in 2012 for this issue and it was thought constipation related and is recommended she have a small amount of MiraLAX daily, also saw a urogynecologist who recommended increased fiber in her diet.  She alternates feeling loose stools with constipation.  GERD was controlled with daily Pepcid in Protonix 20 mg daily.  That visit is recommend she take half capful of MiraLAX daily and referred to [place].    Today, the patient tells me that she has issues with incontinence ever since back in 2010 or so, but they seem to be worse recently telling me that now "I always have to wear a pad".  Describes that she will have incontinence of often soft or even sometimes hard stool.  "I feel like my muscles do not work down there".  Describes this does not happen every day, but at least 3 days out of the week.  Urgency worse after drinking hot coffee or eating.  Tells me she never really feels empty.  Has tried MiraLAX in the past which seems to make things worse because they are looser.    Denies fever, chills, weight loss or blood in her stool.  Past Medical History:  Diagnosis Date  . Anemia in chronic kidney disease    a. s/p aranesp 11/2013 now starting epo with HD 06/2014  . Diabetes mellitus  type II    a. Currently diet controlled since losing weight.  . DJD (degenerative joint disease)   . ESRD (end stage renal disease) ([place])    a. Followed @ [facility];  b. On dialysis since late 2015 (MWF).  Marland Kitchen ESRD (end stage renal disease) on dialysis [facility])    stage 5 per [facility] renal (Dr. Daphane Shepherd) considering transplant Access: LUE fistula Establishing with [facility]   . Essential hypertension   . GERD (gastroesophageal reflux disease)   . Glaucoma    a. L>R  . History of echocardiogram    a. 01/2015 Echo [facility]): EF ?55%, triv MR, mildly dil LA, nl PV.  Marland Kitchen History of stress test    a. 03/2013 Myoview [facility]): EF 63%, no ischemia.  Marland Kitchen History of Urge Incontinence   . HLD (hyperlipidemia)   . Osteoarthritis   . Osteoporosis 05/03/2019   DEXA 04/2019 - 1.5 spine, -2.1 hip, -2.5 forearm Check phosphate and PTH to guide further management in h/o kidney transplant  . Syncope    a. 12/2015.  . Vaginal atrophy    a. vagifem caused spotting, normal TVS  . Vitamin D deficiency     Past Surgical History:  Procedure Laterality Date  . BLADDER SUSPENSION  2011   for stress incontinence-Dr. MacDiarmid  . COLONOSCOPY  02/04/2009   normal, rec rpt 10 yrs  . COLONOSCOPY  03/2019   diverticulosis, rpt 10 yrs (Nandigam)  .  KIDNEY TRANSPLANT Right 04/03/2017   [facility])  . left eye laser surgery  06/2011  . LIGATION OF ARTERIOVENOUS  FISTULA  10/2017   thrombosed L arm AFV  . REFRACTIVE SURGERY    . TUBAL LIGATION  1990s   bilateral    Current Outpatient Medications  Medication Sig Dispense Refill  . alendronate (FOSAMAX) 70 MG tablet Take 70 mg by mouth once a week. Take with a full glass of water on an empty stomach.    Marland Kitchen apraclonidine (IOPIDINE) 0.5 % ophthalmic solution Place 1 drop into the left eye 2 (two) times daily.    Marland Kitchen aspirin 81 MG tablet Take 81 mg by mouth daily.    . Blood Glucose Monitoring Suppl (ONE TOUCH ULTRA SYSTEM KIT) W/DEVICE KIT 1 kit by Does not apply route once. 1  each 0  . carvedilol (COREG) 25 MG tablet Take 1.5 tablets (37.5 mg total) by mouth 2 (two) times daily with a meal.    . Cholecalciferol (VITAMIN D3) 1000 units CAPS Take 2 capsules (2,000 Units total) by mouth daily. 60 capsule   . docusate sodium (COLACE) 100 MG capsule Take 100 mg by mouth 2 (two) times daily as needed.   0  . dorzolamide-timolol (COSOPT) 22.3-6.8 MG/ML ophthalmic solution Place 1 drop into both eyes 3 (three) times daily.    . famotidine (PEPCID) 20 MG tablet Take 1 tablet (20 mg total) by mouth 2 (two) times daily.    Marland Kitchen glucose blood test strip Check sugars once daily 100 each 11  . hydrochlorothiazide (HYDRODIURIL) 12.5 MG tablet Take 12.5 mg by mouth daily.    . Melatonin 3 MG CAPS Take 1-2 capsules (3-6 mg total) by mouth at bedtime as needed (sleep).  0  . Multiple Vitamin (MULTIVITAMIN) tablet Take 1 tablet by mouth daily.    . mycophenolate (MYFORTIC) 180 MG EC tablet Take 2 tablets (360 mg total) by mouth 2 (two) times daily.    . pantoprazole (PROTONIX) 20 MG tablet TAKE 1 TABLET BY MOUTH EVERY DAY 90 tablet 0  . polyethylene glycol powder (GLYCOLAX/MIRALAX) 17 GM/SCOOP powder Take 8.5 g by mouth daily. Hold for diarrhea 3350 g 1  . pravastatin (PRAVACHOL) 80 MG tablet TAKE 1 TABLET BY MOUTH  EVERY EVENING 90 tablet 0  . tacrolimus (PROGRAF) 0.5 MG capsule 2 tabs in am and 2 at night    . travoprost, benzalkonium, (TRAVATAN) 0.004 % ophthalmic solution Place 1 drop into both eyes at bedtime.     No current facility-administered medications for this visit.    Allergies as of 11/01/2020 - Review Complete 09/29/2020  Allergen Reaction Noted  . Brimonidine  03/02/2014  . Shellfish-derived products Swelling 03/02/2014  . Tramadol Other (See Comments) 01/05/2020    Family History  Problem Relation Age of Onset  . Other Father        she did not know her father.  . Diabetes Mother   . Heart failure Mother        died @ 13  . Stroke Brother   . Hyperlipidemia  Brother   . Hypertension Brother   . Hyperlipidemia Brother   . Hypertension Brother   . Hyperlipidemia Sister   . Hypertension Sister   . Hyperlipidemia Sister   . Hypertension Sister   . Heart failure Maternal Grandfather   . Breast cancer Neg Hx   . Colon cancer Neg Hx   . Esophageal cancer Neg Hx   . Stomach cancer Neg Hx   .  Rectal cancer Neg Hx     Social History   Socioeconomic History  . Marital status: Married    Spouse name: Not on file  . Number of children: 1  . Years of education: Not on file  . Highest education level: Not on file  Occupational History  . 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 Smoking status: Former Smoker    Quit date: 10/02/1985    Years since quitting: 35.1  . Smokeless tobacco: Never Used  . Tobacco comment: Remote- quit ~ late 1980's.  Vaping Use  . Vaping Use: Never used  Substance and Sexual Activity  . Alcohol use: No    Alcohol/week: 0.0 standard drinks  . Drug use: No  . Sexual activity: Yes    Partners: Male  Other Topics Concern  . Not on file  Social History Narrative   Lives with husband in [place].   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[employer], 3rd shift   Activity: no regular exercise - goes to gym   Diet: good water, fruits/vegetables daily   Social Determinants of Radio broadcast assistant Strain: Not on file  Food Insecurity: Not on file  Transportation Needs: Not on file  Physical Activity: Not on file  Stress: Not on file  Social Connections: Not on file  Intimate Partner Violence: Not on file    Review of Systems:    Constitutional: No weight loss, fever or chills Skin: No rash Cardiovascular: No chest pain Respiratory: No SOB  Gastrointestinal: See HPI and otherwise negative Genitourinary: No dysuria Neurological: No headache, dizziness or syncope Musculoskeletal: No new muscle or joint pain Hematologic: No bleeding   Psychiatric: No history of depression or anxiety   Physical Exam:  Vital signs: BP 118/62   Pulse 72   Ht _0  (1.6 m)   Wt 144 lb (65.3 kg)   LMP  (LMP Unknown)   BMI 25.51 kg/m   Constitutional:   Pleasant elderly AA female appears to be in NAD, Well developed, Well nourished, alert and cooperative Head:  Normocephalic and atraumatic. Eyes:   PEERL, EOMI. No icterus. Conjunctiva pink. Ears:  Normal auditory acuity. Neck:  Supple Throat: Oral cavity and pharynx without inflammation, swelling or lesion.  Respiratory: Respirations even and unlabored. Lungs clear to auscultation bilaterally.   No wheezes, crackles, or rhonchi.  Cardiovascular: Normal S1, S2. No MRG. Regular rate and rhythm. No peripheral edema, cyanosis or pallor.  Gastrointestinal:  Soft, nondistended, nontender. No rebound or guarding. Normal bowel sounds. No appreciable masses or hepatomegaly. Rectal:  Not performed.  Msk:  Symmetrical without gross deformities. Without edema, no deformity or joint abnormality.  Neurologic:  Alert and  oriented x4;  grossly normal neurologically.  Skin:   Dry and intact without significant lesions or rashes. Psychiatric:  Demonstrates good judgement and reason without abnormal affect or behaviors.  RELEVANT LABS AND IMAGING: CBC    Component Value Date/Time   WBC 5.7 09/23/2020 0835   RBC 4.81 09/23/2020 0835   HGB 12.9 09/23/2020 0835   HGB 8.7 10/30/2013 0000   HCT 40.7 09/23/2020 0835   HCT 29.8 (L) 10/23/2013 1918   HCT 38 01/10/2012 0000   PLT 213.0 09/23/2020 0835   PLT 214 10/23/2013 1918   MCV 84.6 09/23/2020 0835   MCV 83 10/23/2013 1918   MCH 27.3 12/08/2015 0905   MCHC 31.8 09/23/2020 0835   RDW 13.8 09/23/2020  0835   RDW 13.5 10/23/2013 1918   LYMPHSABS 1.1 09/23/2020 0835   LYMPHSABS 1.2 10/23/2013 1918   MONOABS 0.7 09/23/2020 0835   MONOABS 0.6 10/23/2013 1918   EOSABS 0.2 09/23/2020 0835   EOSABS 0.1 10/23/2013 1918   BASOSABS 0.0 09/23/2020 0835    BASOSABS 0.0 10/23/2013 1918    CMP     Component Value Date/Time   NA 139 09/23/2020 0835   NA 140 10/30/2013 0000   NA 136 10/23/2013 1918   K 3.9 09/23/2020 0835   K 4.5 10/30/2013 0000   CL 108 09/23/2020 0835   CL 104 10/23/2013 1918   CL 102 01/10/2012 0000   CO2 21 09/23/2020 0835   CO2 25 10/23/2013 1918   GLUCOSE 135 (H) 09/23/2020 0835   GLUCOSE 150 (H) 10/23/2013 1918   BUN 21 09/23/2020 0835   BUN 88 (H) 10/23/2013 1918   CREATININE 0.96 09/23/2020 0835   CREATININE 6.79 10/30/2013 0000   CALCIUM 9.7 09/23/2020 0835   CALCIUM 9.1 10/23/2013 1918   CALCIUM 9.6 01/10/2012 0000   PROT 7.3 09/23/2020 0835   PROT 7.7 10/23/2013 1918   ALBUMIN 4.3 09/23/2020 0835   ALBUMIN 3.9 10/23/2013 1918   AST 16 09/23/2020 0835   AST 17 10/23/2013 1918   ALT 17 09/23/2020 0835   ALT 28 10/23/2013 1918   ALKPHOS 74 09/23/2020 0835   ALKPHOS 70 10/23/2013 1918   BILITOT 0.4 09/23/2020 0835   BILITOT 0.3 10/23/2013 1918   GFRNONAA 4 (L) 12/08/2015 0905   GFRNONAA 6 (L) 10/23/2013 1918   GFRAA 5 (L) 12/08/2015 0905   GFRAA 7 (L) 10/23/2013 1918    Assessment: 1.  Fecal incontinence: As below consider constipation worse pelvic floor dysfunction 2.  Incomplete evacuation: Consider constipation versus pelvic floor dysfunction  Plan: 1.  Ordered abdominal x-ray two-view today to consider constipation with overflow incontinence.  Discussed with patient that if this does not show [place] constipation then would recommend that she have an anal manometry for further evaluation of pelvic floor dysfunction.  We did discuss this today. 2.  If above does show constipation then would recommend a bowel purge followed by MiraLAX therapy. 3.  Recommend the patient start fiber supplement such as Metamucil, Citrucel or Benefiber once daily for now. 4.  Patient to follow in clinic with myself or Dr. Silverio Decamp as recommended after imaging/testing above.  Ellouise Newer, PA-C [facility] 11/01/2020, 8:55 AM  Cc: Ria Bush, MD

## 2020-11-10 ENCOUNTER — Telehealth: Payer: Self-pay | Admitting: Family Medicine

## 2020-11-10 NOTE — Telephone Encounter (Addendum)
Pt called in wanted to know about changing her pharmacy and wantd to change it to [facility] And is for the blood pressure pill and she wants to keep it at [facility]

## 2020-11-10 NOTE — Telephone Encounter (Signed)
Sorry she wants to keep it at [facility]

## 2020-11-11 NOTE — Telephone Encounter (Signed)
She wanted to keept it at [facility] and she does need refill.

## 2020-11-11 NOTE — Telephone Encounter (Signed)
Attempted to contact pt at home #.  No answer. No vm.  Also, tried cell #.  No answer.  Vm box not set up.  Need clarification of message: which [facility]?  Name of medication she wants kept at [facility]?  Does she need any refills sent in?  If so, which ones and to which pharmacy.

## 2020-11-15 MED ORDER — HYDROCHLOROTHIAZIDE 12.5 MG PO TABS: 12.5000 mg | ORAL_TABLET | Freq: Every day | ORAL | 3 refills | Status: DC

## 2020-11-15 NOTE — Addendum Note (Signed)
Addended by: Brenton Grills on: [id number] AM   Modules accepted: Orders

## 2020-11-15 NOTE — Telephone Encounter (Signed)
E-scribed hydrochlorothiazide refill to [facility].

## 2020-11-23 ENCOUNTER — Other Ambulatory Visit: Payer: Self-pay

## 2020-11-23 ENCOUNTER — Ambulatory Visit
Admission: RE | Admit: 2020-11-23 | Discharge: 2020-11-23 | Disposition: A | Discharge status: Home / Self Care | Payer: Medicare Other | Source: Ambulatory Visit | Attending: Family Medicine | Admitting: Family Medicine

## 2020-11-23 DIAGNOSIS — Z1231 Encounter for screening mammogram for malignant neoplasm of breast: Secondary | ICD-10-CM

## 2020-11-24 DIAGNOSIS — Z940 Kidney transplant status: Secondary | ICD-10-CM | POA: Diagnosis not present

## 2020-11-29 ENCOUNTER — Other Ambulatory Visit: Payer: Self-pay | Admitting: Family Medicine

## 2020-11-29 DIAGNOSIS — N6489 Other specified disorders of breast: Secondary | ICD-10-CM

## 2020-11-29 DIAGNOSIS — R928 Other abnormal and inconclusive findings on diagnostic imaging of breast: Secondary | ICD-10-CM

## 2020-11-29 NOTE — Progress Notes (Signed)
Reviewed and agree with documentation and assessment and plan. K. Veena Nandigam , MD   

## 2020-11-29 NOTE — Progress Notes (Signed)
Dx mammo/US orders

## 2020-12-06 ENCOUNTER — Other Ambulatory Visit: Payer: Self-pay

## 2020-12-06 ENCOUNTER — Ambulatory Visit
Admission: RE | Admit: 2020-12-06 | Discharge: 2020-12-06 | Disposition: A | Discharge status: Home / Self Care | Payer: Medicare Other | Source: Ambulatory Visit | Attending: Family Medicine | Admitting: Family Medicine

## 2020-12-06 DIAGNOSIS — N6489 Other specified disorders of breast: Secondary | ICD-10-CM | POA: Insufficient documentation

## 2020-12-06 DIAGNOSIS — N6311 Unspecified lump in the right breast, upper outer quadrant: Secondary | ICD-10-CM | POA: Diagnosis not present

## 2020-12-06 DIAGNOSIS — R928 Other abnormal and inconclusive findings on diagnostic imaging of breast: Secondary | ICD-10-CM | POA: Insufficient documentation

## 2020-12-06 DIAGNOSIS — R922 Inconclusive mammogram: Secondary | ICD-10-CM | POA: Diagnosis not present

## 2020-12-07 ENCOUNTER — Other Ambulatory Visit: Payer: Self-pay | Admitting: Family Medicine

## 2020-12-07 DIAGNOSIS — N6310 Unspecified lump in the right breast, unspecified quadrant: Secondary | ICD-10-CM

## 2020-12-07 DIAGNOSIS — N6311 Unspecified lump in the right breast, upper outer quadrant: Secondary | ICD-10-CM | POA: Diagnosis not present

## 2020-12-07 DIAGNOSIS — R928 Other abnormal and inconclusive findings on diagnostic imaging of breast: Secondary | ICD-10-CM

## 2020-12-07 NOTE — Progress Notes (Signed)
Orders for biopsy of abnormal R mammogram (2 suspicious masses)

## 2020-12-15 ENCOUNTER — Other Ambulatory Visit: Payer: Self-pay

## 2020-12-15 ENCOUNTER — Ambulatory Visit
Admission: RE | Admit: 2020-12-15 | Discharge: 2020-12-15 | Disposition: A | Discharge status: Home / Self Care | Payer: Medicare Other | Source: Ambulatory Visit | Attending: Family Medicine | Admitting: Family Medicine

## 2020-12-15 DIAGNOSIS — N6311 Unspecified lump in the right breast, upper outer quadrant: Secondary | ICD-10-CM | POA: Diagnosis not present

## 2020-12-15 DIAGNOSIS — R928 Other abnormal and inconclusive findings on diagnostic imaging of breast: Secondary | ICD-10-CM | POA: Diagnosis not present

## 2020-12-15 DIAGNOSIS — N6310 Unspecified lump in the right breast, unspecified quadrant: Secondary | ICD-10-CM | POA: Diagnosis not present

## 2020-12-15 DIAGNOSIS — N641 Fat necrosis of breast: Secondary | ICD-10-CM | POA: Diagnosis not present

## 2020-12-15 HISTORY — PX: BREAST BIOPSY: SHX20

## 2020-12-16 LAB — SURGICAL PATHOLOGY

## 2020-12-17 DIAGNOSIS — Z940 Kidney transplant status: Secondary | ICD-10-CM | POA: Diagnosis not present

## 2020-12-23 ENCOUNTER — Encounter: Payer: Self-pay | Admitting: Family Medicine

## 2020-12-24 ENCOUNTER — Telehealth: Payer: Self-pay | Admitting: Family Medicine

## 2020-12-24 NOTE — Telephone Encounter (Signed)
MRS Krystale CALLED IN RETURNING HONE CALL FROM MS LISA.

## 2020-12-24 NOTE — Telephone Encounter (Signed)
Spoke with pt.  (see Lab Result Notes, 12/15/20)

## 2021-01-03 DIAGNOSIS — H401112 Primary open-angle glaucoma, right eye, moderate stage: Secondary | ICD-10-CM | POA: Diagnosis not present

## 2021-01-03 DIAGNOSIS — H16143 Punctate keratitis, bilateral: Secondary | ICD-10-CM | POA: Diagnosis not present

## 2021-01-03 DIAGNOSIS — H401123 Primary open-angle glaucoma, left eye, severe stage: Secondary | ICD-10-CM | POA: Diagnosis not present

## 2021-01-03 DIAGNOSIS — H02401 Unspecified ptosis of right eyelid: Secondary | ICD-10-CM | POA: Diagnosis not present

## 2021-01-05 ENCOUNTER — Other Ambulatory Visit: Payer: Self-pay | Admitting: Family Medicine

## 2021-01-05 DIAGNOSIS — Z940 Kidney transplant status: Secondary | ICD-10-CM | POA: Diagnosis not present

## 2021-01-06 ENCOUNTER — Telehealth: Payer: Self-pay | Admitting: Gastroenterology

## 2021-01-06 DIAGNOSIS — R159 Full incontinence of feces: Secondary | ICD-10-CM

## 2021-01-06 DIAGNOSIS — K5900 Constipation, unspecified: Secondary | ICD-10-CM

## 2021-01-06 DIAGNOSIS — F980 Enuresis not due to a substance or known physiological condition: Secondary | ICD-10-CM | POA: Diagnosis not present

## 2021-01-06 DIAGNOSIS — R350 Frequency of micturition: Secondary | ICD-10-CM | POA: Diagnosis not present

## 2021-01-06 DIAGNOSIS — R150 Incomplete defecation: Secondary | ICD-10-CM

## 2021-01-06 NOTE — Telephone Encounter (Signed)
Pt last saw Kristen Kirk. Please send accordingly.

## 2021-01-17 ENCOUNTER — Ambulatory Visit: Payer: Medicare Other | Admitting: Family Medicine

## 2021-01-18 DIAGNOSIS — H8101 Meniere's disease, right ear: Secondary | ICD-10-CM | POA: Diagnosis not present

## 2021-01-18 DIAGNOSIS — H6063 Unspecified chronic otitis externa, bilateral: Secondary | ICD-10-CM | POA: Diagnosis not present

## 2021-01-20 DIAGNOSIS — Z7982 Long term (current) use of aspirin: Secondary | ICD-10-CM | POA: Diagnosis not present

## 2021-01-20 DIAGNOSIS — I120 Hypertensive chronic kidney disease with stage 5 chronic kidney disease or end stage renal disease: Secondary | ICD-10-CM | POA: Diagnosis not present

## 2021-01-20 DIAGNOSIS — E1122 Type 2 diabetes mellitus with diabetic chronic kidney disease: Secondary | ICD-10-CM | POA: Diagnosis not present

## 2021-01-20 DIAGNOSIS — Z79899 Other long term (current) drug therapy: Secondary | ICD-10-CM | POA: Diagnosis not present

## 2021-01-20 DIAGNOSIS — Z6824 Body mass index (BMI) 24.0-24.9, adult: Secondary | ICD-10-CM | POA: Diagnosis not present

## 2021-01-20 DIAGNOSIS — N186 End stage renal disease: Secondary | ICD-10-CM | POA: Diagnosis not present

## 2021-01-20 DIAGNOSIS — D649 Anemia, unspecified: Secondary | ICD-10-CM | POA: Diagnosis not present

## 2021-01-20 DIAGNOSIS — Z7983 Long term (current) use of bisphosphonates: Secondary | ICD-10-CM | POA: Diagnosis not present

## 2021-01-20 DIAGNOSIS — Z940 Kidney transplant status: Secondary | ICD-10-CM | POA: Diagnosis not present

## 2021-01-20 DIAGNOSIS — D849 Immunodeficiency, unspecified: Secondary | ICD-10-CM | POA: Diagnosis not present

## 2021-01-26 ENCOUNTER — Other Ambulatory Visit: Payer: Self-pay

## 2021-01-26 ENCOUNTER — Encounter: Payer: Self-pay | Admitting: Family Medicine

## 2021-01-26 ENCOUNTER — Ambulatory Visit (INDEPENDENT_AMBULATORY_CARE_PROVIDER_SITE_OTHER): Payer: Medicare Other | Admitting: Family Medicine

## 2021-01-26 VITALS — BP 120/64 | HR 80 | Temp 97.5°F | Ht 63.0 in | Wt 141.3 lb

## 2021-01-26 DIAGNOSIS — R159 Full incontinence of feces: Secondary | ICD-10-CM

## 2021-01-26 DIAGNOSIS — M25511 Pain in right shoulder: Secondary | ICD-10-CM

## 2021-01-26 DIAGNOSIS — M2170 Unequal limb length (acquired), unspecified site: Secondary | ICD-10-CM

## 2021-01-26 DIAGNOSIS — R1011 Right upper quadrant pain: Secondary | ICD-10-CM | POA: Diagnosis not present

## 2021-01-26 DIAGNOSIS — R109 Unspecified abdominal pain: Secondary | ICD-10-CM | POA: Diagnosis not present

## 2021-01-26 DIAGNOSIS — Z940 Kidney transplant status: Secondary | ICD-10-CM

## 2021-01-26 DIAGNOSIS — K219 Gastro-esophageal reflux disease without esophagitis: Secondary | ICD-10-CM

## 2021-01-26 DIAGNOSIS — K5900 Constipation, unspecified: Secondary | ICD-10-CM

## 2021-01-26 LAB — POC URINALSYSI DIPSTICK (AUTOMATED)
Blood, UA: NEGATIVE
Glucose, UA: NEGATIVE
Nitrite, UA: NEGATIVE
Protein, UA: NEGATIVE
Spec Grav, UA: >=1.03 — AB (ref 1.010–1.025)
Urobilinogen, UA: 0.2 E.U./dL
pH, UA: 6 (ref 5.0–8.0)

## 2021-01-26 LAB — COMPREHENSIVE METABOLIC PANEL
ALT: 14 U/L (ref 0–35)
AST: 14 U/L (ref 0–37)
Albumin: 4.4 g/dL (ref 3.5–5.2)
Alkaline Phosphatase: 64 U/L (ref 39–117)
BUN: 40 mg/dL — ABNORMAL HIGH (ref 6–23)
CO2: 23 mEq/L (ref 19–32)
Calcium: 10 mg/dL (ref 8.4–10.5)
Chloride: 106 mEq/L (ref 96–112)
Creatinine, Ser: 1.33 mg/dL — ABNORMAL HIGH (ref 0.40–1.20)
GFR: 41.14 mL/min — ABNORMAL LOW (ref >60.00)
Glucose, Bld: 130 mg/dL — ABNORMAL HIGH (ref 70–99)
Potassium: 4.2 mEq/L (ref 3.5–5.1)
Sodium: 140 mEq/L (ref 135–145)
Total Bilirubin: 0.5 mg/dL (ref 0.2–1.2)
Total Protein: 7.3 g/dL (ref 6.0–8.3)

## 2021-01-26 LAB — CBC WITH DIFFERENTIAL/PLATELET
Basophils Absolute: 0 10*3/uL (ref 0.0–0.1)
Basophils Relative: 0.6 % (ref 0.0–3.0)
Eosinophils Absolute: 0.2 10*3/uL (ref 0.0–0.7)
Eosinophils Relative: 3.7 % (ref 0.0–5.0)
HCT: 38.9 % (ref 36.0–46.0)
Hemoglobin: 12.4 g/dL (ref 12.0–15.0)
Lymphocytes Relative: 12.5 % (ref 12.0–46.0)
Lymphs Abs: 0.8 10*3/uL (ref 0.7–4.0)
MCHC: 31.8 g/dL (ref 30.0–36.0)
MCV: 84.1 fl (ref 78.0–100.0)
Monocytes Absolute: 0.5 10*3/uL (ref 0.1–1.0)
Monocytes Relative: 7.6 % (ref 3.0–12.0)
Neutro Abs: 5 10*3/uL (ref 1.4–7.7)
Neutrophils Relative %: 75.6 % (ref 43.0–77.0)
Platelets: 221 10*3/uL (ref 150.0–400.0)
RBC: 4.62 Mil/uL (ref 3.87–5.11)
RDW: 14 % (ref 11.5–15.5)
WBC: 6.7 10*3/uL (ref 4.0–10.5)

## 2021-01-26 LAB — LIPASE: Lipase: 20 U/L (ref 11.0–59.0)

## 2021-01-26 NOTE — Progress Notes (Signed)
Patient ID: ALEC MCPHEE, female    DOB: 1950/12/22, 70 y.o.   MRN: [id number]  This visit was conducted in person.  BP 120/64   Pulse 80   Temp (!) 97.5 F (36.4 C) (Temporal)   Ht '5\' 3"'  (1.6 m)   Wt 141 lb 5 oz (64.1 kg)   LMP  (LMP Unknown)   SpO2 99%   BMI 25.03 kg/m    CC: R shoulder pain  Subjective:   HPI: ALYXANDRIA WENTZ is a 70 y.o. female presenting on 01/26/2021 for Shoulder Pain (C/o right shoulder pain.  Started about 2 wks ago.  ) and Abdominal Pain (C/o RUQ pain. Described as cramps.  Started about 1 wk ago.  Denies N/V/D. )   2 wk h/o R anterior shoulder and lateral upper arm pain. Denies inciting trauma/injury. Somewhat better this week. Managed with tylenol and heating pad with benefit.   1 wk h/o RUQ abd pain described as stomach ache associated with mild constipation. Not food related. No sharp stabbing pain. Mild reflux symptoms and gassiness. She continues pantoprazole 55m daily.  No fevers/chills, nausea/vomiting, diarrhea, bloating or indigestion.  Notes increasing leg cramping. Ongoing bowel accidents. Needs to f/u with GI.  She had UCx checked last week showing 50-100k E coli. She didn't receive treatment for this. Denies dysuria, urgency. Ongoing frequency and nocturia - takes myrbetriq for this.   She saw kidney transplant doctor last week with good report.  Notes left leg is shorter than right - she states this is causing instability and near falls. Has not had ortho eval for this. Denies hip pain.      Relevant past medical, surgical, family and social history reviewed and updated as indicated. Interim medical history since our last visit reviewed. Allergies and medications reviewed and updated. Outpatient Medications Prior to Visit  Medication Sig Dispense Refill  . alendronate (FOSAMAX) 70 MG tablet Take 70 mg by mouth once a week. Take with a full glass of water on an empty stomach.    .Marland Kitchenapraclonidine (IOPIDINE) 0.5 % ophthalmic  solution Place 1 drop into the left eye 2 (two) times daily.    .Marland Kitchenaspirin 81 MG tablet Take 81 mg by mouth daily.    . Blood Glucose Monitoring Suppl (ONE TOUCH ULTRA SYSTEM KIT) W/DEVICE KIT 1 kit by Does not apply route once. 1 each 0  . carvedilol (COREG) 25 MG tablet Take 1.5 tablets (37.5 mg total) by mouth 2 (two) times daily with a meal.    . Cholecalciferol (VITAMIN D3) 1000 units CAPS Take 2 capsules (2,000 Units total) by mouth daily. 60 capsule   . docusate sodium (COLACE) 100 MG capsule Take 100 mg by mouth 2 (two) times daily as needed.   0  . dorzolamide-timolol (COSOPT) 22.3-6.8 MG/ML ophthalmic solution Place 1 drop into both eyes 3 (three) times daily.    . famotidine (PEPCID) 20 MG tablet Take 1 tablet (20 mg total) by mouth 2 (two) times daily.    .Marland Kitchenglucose blood test strip Check sugars once daily 100 each 11  . hydrochlorothiazide (HYDRODIURIL) 12.5 MG tablet Take 1 tablet (12.5 mg total) by mouth daily. 90 tablet 3  . Melatonin 3 MG CAPS Take 1-2 capsules (3-6 mg total) by mouth at bedtime as needed (sleep).  0  . Multiple Vitamin (MULTIVITAMIN) tablet Take 1 tablet by mouth daily.    . mycophenolate (MYFORTIC) 180 MG EC tablet Take 2 tablets (360 mg total) by  mouth 2 (two) times daily.    . pantoprazole (PROTONIX) 20 MG tablet TAKE 1 TABLET BY MOUTH EVERY DAY 90 tablet 1  . polyethylene glycol powder (GLYCOLAX/MIRALAX) 17 GM/SCOOP powder Take 8.5 g by mouth daily. Hold for diarrhea 3350 g 1  . pravastatin (PRAVACHOL) 80 MG tablet TAKE 1 TABLET BY MOUTH  EVERY EVENING 90 tablet 0  . tacrolimus (PROGRAF) 0.5 MG capsule 2 tabs in am and 2 at night    . travoprost, benzalkonium, (TRAVATAN) 0.004 % ophthalmic solution Place 1 drop into both eyes at bedtime.     No facility-administered medications prior to visit.     Per HPI unless specifically indicated in ROS section below Review of Systems Objective:  BP 120/64   Pulse 80   Temp (!) 97.5 F (36.4 C) (Temporal)   Ht 5'  3" (1.6 m)   Wt 141 lb 5 oz (64.1 kg)   LMP  (LMP Unknown)   SpO2 99%   BMI 25.03 kg/m   Wt Readings from Last 3 Encounters:  01/26/21 141 lb 5 oz (64.1 kg)  11/01/20 144 lb (65.3 kg)  09/29/20 144 lb 1 oz (65.3 kg)      Physical Exam Vitals and nursing note reviewed.  Constitutional:      Appearance: Normal appearance. She is not ill-appearing.  Cardiovascular:     Rate and Rhythm: Normal rate and regular rhythm.     Pulses: Normal pulses.     Heart sounds: Normal heart sounds. No murmur heard.   Pulmonary:     Effort: Pulmonary effort is normal. No respiratory distress.     Breath sounds: Normal breath sounds. No wheezing, rhonchi or rales.  Abdominal:     General: Abdomen is flat. Bowel sounds are normal. There is no distension.     Palpations: Abdomen is soft. There is no mass.     Tenderness: There is abdominal tenderness (mild) in the right upper quadrant. There is no right CVA tenderness, left CVA tenderness, guarding or rebound.     Hernia: No hernia is present.  Musculoskeletal:        General: No tenderness. Normal range of motion.     Right lower leg: No edema.     Left lower leg: No edema.     Comments:  FROM bilateral shoulders  No reproducible pain to palpation at shoulder landmarks No pain with testing int/ext rotation of shoulder  Neg empty can sign, no pain with Speed test No impingement Length from GTB to lateral malleolus: L 77cm, R 78cm  Skin:    General: Skin is warm and dry.     Findings: No rash.  Neurological:     Mental Status: She is alert.  Psychiatric:        Mood and Affect: Mood normal.        Behavior: Behavior normal.       Results for orders placed or performed in visit on 01/26/21  Comprehensive metabolic panel  Result Value Ref Range   Sodium 140 135 - 145 mEq/L   Potassium 4.2 3.5 - 5.1 mEq/L   Chloride 106 96 - 112 mEq/L   CO2 23 19 - 32 mEq/L   Glucose, Bld 130 (H) 70 - 99 mg/dL   BUN 40 (H) 6 - 23 mg/dL   Creatinine,  Ser 1.33 (H) 0.40 - 1.20 mg/dL   Total Bilirubin 0.5 0.2 - 1.2 mg/dL   Alkaline Phosphatase 64 39 - 117 U/L   AST 14  0 - 37 U/L   ALT 14 0 - 35 U/L   Total Protein 7.3 6.0 - 8.3 g/dL   Albumin 4.4 3.5 - 5.2 g/dL   GFR 41.14 (L) >60.00 mL/min   Calcium 10.0 8.4 - 10.5 mg/dL  CBC with Differential/Platelet  Result Value Ref Range   WBC 6.7 4.0 - 10.5 K/uL   RBC 4.62 3.87 - 5.11 Mil/uL   Hemoglobin 12.4 12.0 - 15.0 g/dL   HCT 38.9 36.0 - 46.0 %   MCV 84.1 78.0 - 100.0 fl   MCHC 31.8 30.0 - 36.0 g/dL   RDW 14.0 11.5 - 15.5 %   Platelets 221.0 150.0 - 400.0 K/uL   Neutrophils Relative % 75.6 43.0 - 77.0 %   Lymphocytes Relative 12.5 12.0 - 46.0 %   Monocytes Relative 7.6 3.0 - 12.0 %   Eosinophils Relative 3.7 0.0 - 5.0 %   Basophils Relative 0.6 0.0 - 3.0 %   Neutro Abs 5.0 1.4 - 7.7 K/uL   Lymphs Abs 0.8 0.7 - 4.0 K/uL   Monocytes Absolute 0.5 0.1 - 1.0 K/uL   Eosinophils Absolute 0.2 0.0 - 0.7 K/uL   Basophils Absolute 0.0 0.0 - 0.1 K/uL  Lipase  Result Value Ref Range   Lipase 20.0 11.0 - 59.0 U/L  POCT Urinalysis Dipstick (Automated)  Result Value Ref Range   Color, UA yellow    Clarity, UA clear    Glucose, UA Negative Negative   Bilirubin, UA 1+    Ketones, UA +/-    Spec Grav, UA >=1.030 (A) 1.010 - 1.025   Blood, UA negative    pH, UA 6.0 5.0 - 8.0   Protein, UA Negative Negative   Urobilinogen, UA 0.2 0.2 or 1.0 E.U./dL   Nitrite, UA negative    Leukocytes, UA Small (1+) (A) Negative    Assessment & Plan:  This visit occurred during the SARS-CoV-2 public health emergency.  Safety protocols were in place, including screening questions prior to the visit, additional usage of staff PPE, and extensive cleaning of exam room while observing appropriate contact time as indicated for disinfecting solutions.   Problem List Items Addressed This Visit    GERD (gastroesophageal reflux disease)    Overall stable period on daily protonix 81m and pepcid.       Right  sided abdominal pain - Primary    Of unclear cause however symptoms not typical of biliary colic.  Check UA today - suspicious for infection however no true UTI symptoms despite chronic urinary frequency for which she is on myrbetriq.  Check labs (CBC, CMP, lipase).  Check abdominal ultrasound for further evaluation.       Relevant Orders   POCT Urinalysis Dipstick (Automated) (Completed)   Comprehensive metabolic panel (Completed)   CBC with Differential/Platelet (Completed)   Lipase (Completed)   [place]Abdomen Complete   Urine Culture   Fecal incontinence alternating with constipation    Ongoing. I will send note to GI about follow up plan.       Right shoulder pain    Benign exam, seems to be improving on its own.  rec continue supportive care (ice, heating pad, tylenol for discomfort)      Kidney transplant status   Acquired leg length discrepancy    Pt endorses longstanding history of this.  No significant difference appreciated today. I did encourage she see sports medicine for further evaluation.           No orders of  the defined types were placed in this encounter.  Orders Placed This Encounter  Procedures  . Urine Culture  . US Abdomen Complete    Standing Status:   Future    Standing Expiration Date:   01/26/2022    Order Specific Question:   Reason for Exam (SYMPTOM  OR DIAGNOSIS REQUIRED)    Answer:   R sided abd pain, h/o kidney transplant    Order Specific Question:   Preferred imaging location?    Answer:   [facility]  . Comprehensive metabolic panel  . CBC with Differential/Platelet  . Lipase  . POCT Urinalysis Dipstick (Automated)    Patient Instructions  Labs today, check abdominal ultrasound for right sided abdominal pain.  Schedule appointment with Dr Lorelei Pont sports medicine to evaluate for possible leg length discrepancy.  Continue heating pad and tylenol for R shoulder pain - should continue to improve with this.  I will send a note to  GI about bowel incontinence issue.   Follow up plan: Return if symptoms worsen or fail to improve.  Ria Bush, MD

## 2021-01-26 NOTE — Patient Instructions (Addendum)
Labs today, check abdominal ultrasound for right sided abdominal pain.  Schedule appointment with Dr Lorelei Pont sports medicine to evaluate for possible leg length discrepancy.  Continue heating pad and tylenol for R shoulder pain - should continue to improve with this.  I will send a note to GI about bowel incontinence issue.

## 2021-01-28 DIAGNOSIS — M2170 Unequal limb length (acquired), unspecified site: Secondary | ICD-10-CM | POA: Insufficient documentation

## 2021-01-28 LAB — URINE CULTURE
MICRO NUMBER:: 11820978
SPECIMEN QUALITY:: ADEQUATE

## 2021-01-28 NOTE — Assessment & Plan Note (Addendum)
Of unclear cause however symptoms not typical of biliary colic.  Check UA today - suspicious for infection however no true UTI symptoms despite chronic urinary frequency for which she is on myrbetriq.  Check labs (CBC, CMP, lipase).  Check abdominal ultrasound for further evaluation.

## 2021-01-28 NOTE — Telephone Encounter (Signed)
X-ray order in epic. Spoke with patient and she is aware that we have ordered an x-ray for her. She is aware that she can stop by the x-ray department in the basement at her convenience anytime before 5 PM. Patient states that she will not be able to have this done today because her husband has an appointment at the [place]. Advised that she can go by at her earliest convenience and Anderson Malta will further advise at that time. Patient verbalized understanding and had no concerns at the end of the call.

## 2021-01-28 NOTE — Telephone Encounter (Signed)
Patient seen by PCP in office this week, notes ongoing bowel incontinence trouble and asks about f/u plan - will forward to GI.

## 2021-01-28 NOTE — Addendum Note (Signed)
Addended by: Yevette Edwards on: 01/28/2021 09:19 AM   Modules accepted: Orders

## 2021-01-28 NOTE — Assessment & Plan Note (Addendum)
Benign exam, seems to be improving on its own.  rec continue supportive care (ice, heating pad, tylenol for discomfort)

## 2021-01-28 NOTE — Assessment & Plan Note (Signed)
Ongoing. I will send note to GI about follow up plan.

## 2021-01-28 NOTE — Assessment & Plan Note (Signed)
Overall stable period on daily protonix '20mg'$  and pepcid.

## 2021-01-28 NOTE — Assessment & Plan Note (Signed)
Pt endorses longstanding history of this.  No significant difference appreciated today. I did encourage she see sports medicine for further evaluation.

## 2021-01-31 ENCOUNTER — Telehealth: Payer: Self-pay

## 2021-01-31 ENCOUNTER — Other Ambulatory Visit: Payer: Self-pay | Admitting: Family Medicine

## 2021-01-31 MED ORDER — CEPHALEXIN 250 MG PO CAPS: 250.0000 mg | ORAL_CAPSULE | Freq: Four times a day (QID) | ORAL | 0 refills | Status: DC

## 2021-01-31 NOTE — Telephone Encounter (Signed)
Lvm asking pt to call back.  Need to relay results.  Results/Dr. G's message: Plz notify kidney function was slightly worse with GFR down to 41.  Blood counts, liver, pancreas normal.  Urine culture returned growing bacteria - I'd like her to take abx sent to pharmacy (keflex QID x 5 days).

## 2021-02-01 ENCOUNTER — Ambulatory Visit (INDEPENDENT_AMBULATORY_CARE_PROVIDER_SITE_OTHER)
Admission: RE | Admit: 2021-02-01 | Discharge: 2021-02-01 | Disposition: A | Discharge status: Home / Self Care | Payer: Medicare Other | Source: Ambulatory Visit | Attending: Physician Assistant | Admitting: Physician Assistant

## 2021-02-01 ENCOUNTER — Other Ambulatory Visit: Payer: Self-pay

## 2021-02-01 DIAGNOSIS — R159 Full incontinence of feces: Secondary | ICD-10-CM | POA: Diagnosis not present

## 2021-02-01 DIAGNOSIS — R150 Incomplete defecation: Secondary | ICD-10-CM

## 2021-02-01 DIAGNOSIS — R109 Unspecified abdominal pain: Secondary | ICD-10-CM | POA: Diagnosis not present

## 2021-02-01 DIAGNOSIS — K5900 Constipation, unspecified: Secondary | ICD-10-CM | POA: Diagnosis not present

## 2021-02-01 NOTE — Telephone Encounter (Signed)
Patient notified as instructed by telephone and verbalized understanding. 

## 2021-02-01 NOTE — Telephone Encounter (Signed)
Left message with husband for patient to call the office back.

## 2021-02-03 ENCOUNTER — Other Ambulatory Visit: Payer: Self-pay

## 2021-02-03 ENCOUNTER — Ambulatory Visit (INDEPENDENT_AMBULATORY_CARE_PROVIDER_SITE_OTHER): Payer: Medicare Other | Admitting: Family Medicine

## 2021-02-03 ENCOUNTER — Encounter: Payer: Self-pay | Admitting: Family Medicine

## 2021-02-03 VITALS — BP 110/60 | HR 62 | Temp 98.1°F | Ht 63.0 in | Wt 137.5 lb

## 2021-02-03 DIAGNOSIS — R29898 Other symptoms and signs involving the musculoskeletal system: Secondary | ICD-10-CM

## 2021-02-03 DIAGNOSIS — M2170 Unequal limb length (acquired), unspecified site: Secondary | ICD-10-CM | POA: Diagnosis not present

## 2021-02-03 DIAGNOSIS — M5137 Other intervertebral disc degeneration, lumbosacral region: Secondary | ICD-10-CM | POA: Diagnosis not present

## 2021-02-03 NOTE — Progress Notes (Signed)
Kristen Rund T. Kyndal Gloster, MD, [facility] [place] [facility] [place], [address]  Phone: [phone]  FAX: [place] - 70 y.o. female  MRN [id number]  Date of Birth: [birth year]  Date: 02/03/2021  PCP: Ria Bush, MD  Referral: Ria Bush, MD  Chief Complaint  Patient presents with  . Leg Discrepancy    This visit occurred during the SARS-CoV-2 public health emergency.  Safety protocols were in place, including screening questions prior to the visit, additional usage of staff PPE, and extensive cleaning of exam room while observing appropriate contact time as indicated for disinfecting solutions.   Subjective:   Kristen Kirk is a 70 y.o. very pleasant female patient with Body mass index is 24.36 kg/m. who presents with the following:  She is here to be evaluated for LL discrepancy.  She has been having some back pain as well.  She is required some pain medicine as well as epidural steroid injection.  She has been attempting to have some walking for exercise, but this has been quite a bit of difficulty for her.  ? Instability with walking, L < R leg length discrepancy. 1 cm by Dr. Synthia Innocent measurements. Had an ESI. Did help with some back pain. Had been walking - or at least try to do so.   88 on the R and 86 1/2 on the L poron and I gave her some orthopedic felt  Hip and back pain is ongoing Weakness, basic rehab  Review of Systems is noted in the HPI, as appropriate   Objective:   BP 110/60   Pulse 62   Temp 98.1 F (36.7 C) (Temporal)   Ht _0  (1.6 m)   Wt 137 lb 8 oz (62.4 kg)   LMP  (LMP Unknown)   SpO2 99%   BMI 24.36 kg/m    HIP EXAM: SIDE: L > R ROM: Abduction, Flexion, Internal and External range of motion: full Pain with terminal IROM and EROM: minimal GTB: NT SLR: NEG Knees: No effusion FABER: NT REVERSE FABER: NT,  neg Piriformis: NT at direct palpation Str: flexion: 4/5 abduction: 4-/5 adduction: 4/5 Strength testing non-tender     Radiology: CLINICAL DATA:  Chronic left-sided low back pain with left-sided sciatica. Chronic lumbar back pain failed conservative management; lumbar radiculopathy, osteoporosis presence or risk. Additional history provided by technologist: Patient reports low back pain radiating into left leg, possible lifting injury 3 years ago.  EXAM: MRI LUMBAR SPINE WITHOUT CONTRAST  TECHNIQUE: Multiplanar, multisequence MR imaging of the lumbar spine was performed. No intravenous contrast was administered.  COMPARISON:  Lumbar spine radiographs 10/20/2019, MRI of the lumbar spine 04/26/2006  FINDINGS: Segmentation: For the purposes of this dictation, five lumbar vertebrae are assumed and the caudal most well-formed intervertebral disc is designated L5-S1.  Alignment: Grade 1 retrolisthesis at the T12-L1 through L3-L4 levels. 5 mm L4-L5 grade 1 anterolisthesis.  Vertebrae: Vertebral body height is maintained. No suspicious osseous lesion. Trace multilevel degenerative endplate edema.  Conus medullaris and cauda equina: Conus extends to the L1-L2 level. No signal abnormality within the visualized distal spinal cord.  Paraspinal and other soft tissues: Atrophic native kidneys. Right lower quadrant presumed transplant kidney. Atrophy of the lumbar paraspinal musculature.  Disc levels:  Unless otherwise stated, the level by level findings below have not significantly changed since prior MRI 04/26/2006.  Interval progression of multilevel disc degeneration. Most  notably there is now moderate disc degeneration at L4-L5  L1-L2: Grade 1 retrolisthesis. A small disc bulge is new from prior examination. There is also a new broad-based left foraminal disc protrusion. No significant spinal canal stenosis or neural foraminal narrowing.  L2-L3: Grade 1  retrolisthesis. Disc bulge with endplate spurring. Interval progression of a right subarticular to right extraforaminal broad-based disc protrusion. Interval progression of right subarticular narrowing with crowding of the descending right L3 nerve root. Mild/moderate right neural foraminal narrowing, also progressed.  L3-L4: Grade 1 retrolisthesis. Disc bulge with endplate spurring. A superimposed small central disc protrusion has progressed. Moderate facet arthrosis with advanced ligamentum flavum hypertrophy, progressed. Progressive moderate bilateral subarticular and central canal stenosis. Progressive bilateral neural foraminal narrowing (moderate right, moderate/severe left).  L4-L5: 5 mm grade 1 anterolisthesis which is new from prior examination. Disc uncovering with disc bulge. Progressive advanced facet arthrosis/ligamentum flavum hypertrophy. Progressive severe bilateral subarticular and central canal stenosis. Mild/moderate bilateral neural foraminal narrowing, unchanged.  L5-S1: Disc bulge with endplate spurring. Superimposed small central disc protrusion. Moderate facet arthrosis/ligamentum flavum hypertrophy. A 6 mm ventrally projecting synovial facet cyst on the left has increased in size as compared to prior examination. This contributes to moderate left subarticular stenosis with crowding of the descending left S1 nerve root. Unchanged mild right subarticular narrowing. Central canal patent. Unchanged bilateral neural foraminal narrowing (moderate right, moderate/severe left).  IMPRESSION: Lumbar spondylosis has progressed at several levels since prior MRI 04/26/2006. Findings are most notably as follows.  At L4-L5, there is now 5 mm grade 1 anterolisthesis. Disc uncovering with disc bulge. Progressive advanced facet arthrosis/ligamentum flavum hypertrophy. Progressive severe bilateral subarticular and central canal stenosis. Unchanged mild/moderate  bilateral neural foraminal narrowing.  At L3-L4, progressive moderate facet arthrosis with advanced ligamentum flavum hypertrophy contributing to progressive moderate bilateral subarticular and central canal stenosis. Progressive bilateral neural foraminal narrowing (moderate right, moderate/severe left).  At L5-S1, there has been interval increase in size of a 6 mm ventrally projecting synovial facet cyst on the left. This contributes to moderate left subarticular stenosis with crowding of the descending left S1 nerve root. Unchanged multifactorial bilateral neural foraminal narrowing (moderate right, moderate/severe left).  At L2-L3, interval progression of a broad-based right subarticular to extraforaminal disc protrusion. Progressive right subarticular narrowing with crowding of the descending right L3 nerve root.   Electronically Signed   By: Kellie Simmering DO   On: 01/14/2020 07:24  Assessment and Plan:     ICD-10-CM   1. Leg length discrepancy  M21.70   2. Weakness of both hips  R29.898   3. DDD (degenerative disc disease), lumbosacral  M51.37    Total encounter time: 30 minutes. This includes total time spent on the day of encounter.  This includes independent review of thoracic and lumbar spine x-rays as well as MRI of her lumbar spine from 2021.  Strength training.  She does have a significant leg length discrepancy, 1.5 cm shorter on the left by my measurements.  I custom crafted a 1 cm lift out of heavy dense Poron for the patient to use in multiple shoes.  Also gave her some orthopedic felt with an approximate 7 mm elevation that she can use also as well.  Signed,  Maud Deed. Tamzin Bertling, MD   Outpatient Encounter Medications as of 02/03/2021  Medication Sig  . alendronate (FOSAMAX) 70 MG tablet Take 70 mg by mouth once a week. Take with a full glass of water on an empty stomach.  Marland Kitchen  apraclonidine (IOPIDINE) 0.5 % ophthalmic solution Place 1 drop into the left  eye 2 (two) times daily.  Marland Kitchen aspirin 81 MG tablet Take 81 mg by mouth daily.  . Blood Glucose Monitoring Suppl (ONE TOUCH ULTRA SYSTEM KIT) W/DEVICE KIT 1 kit by Does not apply route once.  . carvedilol (COREG) 25 MG tablet Take 1.5 tablets (37.5 mg total) by mouth 2 (two) times daily with a meal.  . cephALEXin (KEFLEX) 250 MG capsule Take 1 capsule (250 mg total) by mouth 4 (four) times daily.  . Cholecalciferol (VITAMIN D3) 1000 units CAPS Take 2 capsules (2,000 Units total) by mouth daily.  Marland Kitchen docusate sodium (COLACE) 100 MG capsule Take 100 mg by mouth 2 (two) times daily as needed.   . dorzolamide-timolol (COSOPT) 22.3-6.8 MG/ML ophthalmic solution Place 1 drop into both eyes 3 (three) times daily.  . famotidine (PEPCID) 20 MG tablet Take 1 tablet (20 mg total) by mouth 2 (two) times daily.  Marland Kitchen glucose blood test strip Check sugars once daily  . hydrochlorothiazide (HYDRODIURIL) 12.5 MG tablet Take 1 tablet (12.5 mg total) by mouth daily.  . Melatonin 3 MG CAPS Take 1-2 capsules (3-6 mg total) by mouth at bedtime as needed (sleep).  . Multiple Vitamin (MULTIVITAMIN) tablet Take 1 tablet by mouth daily.  . mycophenolate (MYFORTIC) 180 MG EC tablet Take 2 tablets (360 mg total) by mouth 2 (two) times daily.  . pantoprazole (PROTONIX) 20 MG tablet TAKE 1 TABLET BY MOUTH EVERY DAY  . polyethylene glycol powder (GLYCOLAX/MIRALAX) 17 GM/SCOOP powder Take 8.5 g by mouth daily. Hold for diarrhea  . pravastatin (PRAVACHOL) 80 MG tablet TAKE 1 TABLET BY MOUTH  EVERY EVENING  . tacrolimus (PROGRAF) 0.5 MG capsule 2 tabs in am and 2 at night  . travoprost, benzalkonium, (TRAVATAN) 0.004 % ophthalmic solution Place 1 drop into both eyes at bedtime.   No facility-administered encounter medications on file as of 02/03/2021.

## 2021-02-11 DIAGNOSIS — R7989 Other specified abnormal findings of blood chemistry: Secondary | ICD-10-CM | POA: Diagnosis not present

## 2021-02-11 DIAGNOSIS — Z940 Kidney transplant status: Secondary | ICD-10-CM | POA: Diagnosis not present

## 2021-02-25 DIAGNOSIS — Z8249 Family history of ischemic heart disease and other diseases of the circulatory system: Secondary | ICD-10-CM | POA: Diagnosis not present

## 2021-02-25 DIAGNOSIS — Z87891 Personal history of nicotine dependence: Secondary | ICD-10-CM | POA: Diagnosis not present

## 2021-02-25 DIAGNOSIS — H401123 Primary open-angle glaucoma, left eye, severe stage: Secondary | ICD-10-CM | POA: Diagnosis not present

## 2021-02-25 DIAGNOSIS — I129 Hypertensive chronic kidney disease with stage 1 through stage 4 chronic kidney disease, or unspecified chronic kidney disease: Secondary | ICD-10-CM | POA: Diagnosis not present

## 2021-02-25 DIAGNOSIS — E785 Hyperlipidemia, unspecified: Secondary | ICD-10-CM | POA: Diagnosis not present

## 2021-02-25 DIAGNOSIS — N189 Chronic kidney disease, unspecified: Secondary | ICD-10-CM | POA: Diagnosis not present

## 2021-02-25 DIAGNOSIS — Z940 Kidney transplant status: Secondary | ICD-10-CM | POA: Diagnosis not present

## 2021-02-25 DIAGNOSIS — E1122 Type 2 diabetes mellitus with diabetic chronic kidney disease: Secondary | ICD-10-CM | POA: Diagnosis not present

## 2021-02-25 DIAGNOSIS — Z961 Presence of intraocular lens: Secondary | ICD-10-CM | POA: Diagnosis not present

## 2021-02-25 DIAGNOSIS — K219 Gastro-esophageal reflux disease without esophagitis: Secondary | ICD-10-CM | POA: Diagnosis not present

## 2021-02-25 DIAGNOSIS — Z9841 Cataract extraction status, right eye: Secondary | ICD-10-CM | POA: Diagnosis not present

## 2021-02-25 DIAGNOSIS — Z833 Family history of diabetes mellitus: Secondary | ICD-10-CM | POA: Diagnosis not present

## 2021-02-25 DIAGNOSIS — Z7982 Long term (current) use of aspirin: Secondary | ICD-10-CM | POA: Diagnosis not present

## 2021-02-25 DIAGNOSIS — Z79899 Other long term (current) drug therapy: Secondary | ICD-10-CM | POA: Diagnosis not present

## 2021-02-25 DIAGNOSIS — H401133 Primary open-angle glaucoma, bilateral, severe stage: Secondary | ICD-10-CM | POA: Diagnosis not present

## 2021-02-25 HISTORY — PX: AQUEOUS SHUNT: SHX6305

## 2021-02-25 HISTORY — PX: CATARACT EXTRACTION: SUR2

## 2021-03-07 ENCOUNTER — Ambulatory Visit: Payer: Medicare Other | Admitting: Physician Assistant

## 2021-03-07 ENCOUNTER — Encounter: Payer: Self-pay | Admitting: Physician Assistant

## 2021-03-07 VITALS — BP 120/70 | HR 69 | Ht 63.0 in | Wt 138.0 lb

## 2021-03-07 DIAGNOSIS — R159 Full incontinence of feces: Secondary | ICD-10-CM | POA: Diagnosis not present

## 2021-03-07 DIAGNOSIS — K5900 Constipation, unspecified: Secondary | ICD-10-CM

## 2021-03-07 NOTE — Patient Instructions (Addendum)
You have been scheduled to have an anorectal manometry at [facility] on 03/16/2021 at 10:30am. Please arrive 30 minutes prior to your appointment time for registration (1st floor of the hospital-admissions).  Please make certain to use 1 Fleets enema 2 hours prior to coming for your appointment. You can purchase Fleets enemas from the laxative section at your drug store. You should not eat anything during the two hours prior to the procedure. You may take regular medications with small sips of water at least 2 hours prior to the study.  Anorectal manometry is a test performed to evaluate patients with constipation or fecal incontinence. This test measures the pressures of the anal sphincter muscles, the sensation in the rectum, and the neural reflexes that are needed for normal bowel movements.  THE PROCEDURE The test takes approximately 30 minutes to 1 hour. You will be asked to change into a hospital gown. A technician or nurse will explain the procedure to you, take a brief health history, and answer any questions you may have. The patient then lies on his or her left side. A small, flexible tube, about the size of a thermometer, with a balloon at the end is inserted into the rectum. The catheter is connected to a machine that measures the pressure. During the test, the small balloon attached to the catheter may be inflated in the rectum to assess the normal reflex pathways. The nurse or technician may also ask the person to squeeze, relax, and push at various times. The anal sphincter muscle pressures are measured during each of these maneuvers. To squeeze, the patient tightens the sphincter muscles as if trying to prevent anything from coming out. To push or bear down, the patient strains down as if trying to have a bowel movement.  Continue Miralax daily   Due to recent changes in healthcare laws, you may see the results of your imaging and laboratory studies on MyChart before your  provider has had a chance to review them.  We understand that in some cases there may be results that are confusing or concerning to you. Not all laboratory results come back in the same time frame and the provider may be waiting for multiple results in order to interpret others.  Please give [place] 48 hours in order for your provider to thoroughly review all the results before contacting the office for clarification of your results.   If you are age 41 or older, your body mass index should be between 23-30. Your Body mass index is 24.45 kg/m. If this is out of the aforementioned range listed, please consider follow up with your Primary Care Provider.  If you are age 64 or younger, your body mass index should be between 19-25. Your Body mass index is 24.45 kg/m. If this is out of the aformentioned range listed, please consider follow up with your Primary Care Provider.   __________________________________________________________  The [facility] providers would like to encourage you to use [facility] to communicate with providers for non-urgent requests or questions.  Due to long hold times on the telephone, sending your provider a message by [facility] may be a faster and more efficient way to get a response.  Please allow 48 business hours for a response.  Please remember that this is for non-urgent requests.   I appreciate the  opportunity to care for you  Thank You   Ellouise Newer, PA-C

## 2021-03-07 NOTE — Progress Notes (Signed)
Chief Complaint: Follow-up constipation and fecal incontinence  HPI:    Kristen Kirk is a 70 year old African-American female with a past medical history of diabetes, ESRD on dialysis status post kidney transplant 04/2017, GERD and multiple others listed below, known to Dr. Silverio Decamp, who presents to clinic today for follow-up of her fecal incontinence and constipation.    03/25/2019 colonoscopy done for screening with diverticulosis in the sigmoid colon and in the descending colon, nonbleeding internal hemorrhoids and otherwise normal.  Repeat recommended in 10 years.    11/01/2020 patient seen in clinic and discussed that she had issues with incontinence ever since 2010 but they seem to be worse recently.  Scribes happen at least 3 days a week.  At that time ordered an x-ray to discuss if this was overflow incontinence.  Also explained that she could have anal manometry for further evaluation of pelvic floor dysfunction.  Also recommended fiber.    11/01/2020 abdominal x-ray showed moderate burden of stool throughout the colon and rectum.  At that time recommend she do a MiraLAX bowel purge and then continue MiraLAX 1-2 times a day.    02/03/2021 patient had repeat abdominal x-ray which showed diffuse stool throughout the colon likely indicative of a degree of constipation.  At that time recommended increasing MiraLAX up to 3 times a day.  At that time patient was called and said she did not have a bowel regimen.  She was started on MiraLAX 3 times a day.  Discussed possibly changing to a prescription laxative pending how this works for her.    Today, the patient tells me that she really was not using MiraLAX on a regular basis and when she started taking it 3 times a day it made her stool just "run out of me".  Explains that her real issue again is the incontinence.  She has started wearing a pad because she will have seepage throughout the day.  Also has leakage whenever she sits down to urinate.  Patient  is just wondering if there is something to do for this, "if there is not then I will just deal with it".    Denies fever, chills or weight loss.  Past Medical History:  Diagnosis Date  . Anemia in chronic kidney disease    a. s/p aranesp 11/2013 now starting epo with HD 06/2014  . Diabetes mellitus type II    a. Currently diet controlled since losing weight.  . DJD (degenerative joint disease)   . ESRD (end stage renal disease) ([place])    a. Followed @ [facility];  b. On dialysis since late 2015 (MWF).  Marland Kitchen ESRD (end stage renal disease) on dialysis [facility])    stage 5 per [facility] renal (Dr. Daphane Shepherd) considering transplant Access: LUE fistula Establishing with [facility]   . Essential hypertension   . GERD (gastroesophageal reflux disease)   . Glaucoma    a. L>R  . History of echocardiogram    a. 01/2015 Echo [facility]): EF ?55%, triv MR, mildly dil LA, nl PV.  Marland Kitchen History of stress test    a. 03/2013 Myoview [facility]): EF 63%, no ischemia.  Marland Kitchen History of Urge Incontinence   . HLD (hyperlipidemia)   . Osteoarthritis   . Osteoporosis 05/03/2019   DEXA 04/2019 - 1.5 spine, -2.1 hip, -2.5 forearm Check phosphate and PTH to guide further management in h/o kidney transplant  . Syncope    a. 12/2015.  . Vaginal atrophy    a. vagifem caused spotting,  normal TVS  . Vitamin D deficiency     Past Surgical History:  Procedure Laterality Date  . BLADDER SUSPENSION  2011   for stress incontinence-Dr. MacDiarmid  . BREAST BIOPSY Right 12/15/2020   "Q" clip @ 2 o'clock - fat necrosis  . BREAST BIOPSY Right 12/15/2020   "X" clip @ 4 o'clock - fat necrosis  . CATARACT EXTRACTION    . COLONOSCOPY  02/04/2009   normal, rec rpt 10 yrs  . COLONOSCOPY  03/2019   diverticulosis, rpt 10 yrs ([facility])  . KIDNEY TRANSPLANT Right 04/03/2017   [facility])  . left eye laser surgery  06/2011  . LIGATION OF ARTERIOVENOUS  FISTULA  10/2017   thrombosed L arm AFV  . REFRACTIVE SURGERY    . TUBAL LIGATION  1990s    bilateral    Current Outpatient Medications  Medication Sig Dispense Refill  . alendronate (FOSAMAX) 70 MG tablet Take 70 mg by mouth once a week. Take with a full glass of water on an empty stomach.    Marland Kitchen apraclonidine (IOPIDINE) 0.5 % ophthalmic solution Place 1 drop into the left eye 2 (two) times daily.    Marland Kitchen aspirin 81 MG tablet Take 81 mg by mouth daily.    . Blood Glucose Monitoring Suppl (ONE TOUCH ULTRA SYSTEM KIT) W/DEVICE KIT 1 kit by Does not apply route once. 1 each 0  . carvedilol (COREG) 25 MG tablet Take 1.5 tablets (37.5 mg total) by mouth 2 (two) times daily with a meal.    . cephALEXin (KEFLEX) 250 MG capsule Take 1 capsule (250 mg total) by mouth 4 (four) times daily. 20 capsule 0  . Cholecalciferol (VITAMIN D3) 1000 units CAPS Take 2 capsules (2,000 Units total) by mouth daily. 60 capsule   . docusate sodium (COLACE) 100 MG capsule Take 100 mg by mouth 2 (two) times daily as needed.   0  . dorzolamide-timolol (COSOPT) 22.3-6.8 MG/ML ophthalmic solution Place 1 drop into both eyes 3 (three) times daily.    . famotidine (PEPCID) 20 MG tablet Take 1 tablet (20 mg total) by mouth 2 (two) times daily.    Marland Kitchen glucose blood test strip Check sugars once daily 100 each 11  . hydrochlorothiazide (HYDRODIURIL) 12.5 MG tablet Take 1 tablet (12.5 mg total) by mouth daily. 90 tablet 3  . Melatonin 3 MG CAPS Take 1-2 capsules (3-6 mg total) by mouth at bedtime as needed (sleep).  0  . Multiple Vitamin (MULTIVITAMIN) tablet Take 1 tablet by mouth daily.    . mycophenolate (MYFORTIC) 180 MG EC tablet Take 2 tablets (360 mg total) by mouth 2 (two) times daily.    . pantoprazole (PROTONIX) 20 MG tablet TAKE 1 TABLET BY MOUTH EVERY DAY 90 tablet 1  . polyethylene glycol powder (GLYCOLAX/MIRALAX) 17 GM/SCOOP powder Take 8.5 g by mouth daily. Hold for diarrhea 3350 g 1  . pravastatin (PRAVACHOL) 80 MG tablet TAKE 1 TABLET BY MOUTH  EVERY EVENING 90 tablet 0  . tacrolimus (PROGRAF) 0.5 MG capsule 2  tabs in am and 2 at night    . travoprost, benzalkonium, (TRAVATAN) 0.004 % ophthalmic solution Place 1 drop into both eyes at bedtime.     No current facility-administered medications for this visit.    Allergies as of 03/07/2021 - Review Complete 03/07/2021  Allergen Reaction Noted  . Brimonidine  03/02/2014  . Shellfish-derived products Swelling 03/02/2014  . Tramadol Other (See Comments) 01/05/2020    Family History  Problem Relation  Age of Onset  . Other Father        she did not know her father.  . Diabetes Mother   . Heart failure Mother        died @ 41  . Stroke Brother   . Hyperlipidemia Brother   . Hypertension Brother   . Hyperlipidemia Brother   . Hypertension Brother   . Hyperlipidemia Sister   . Hypertension Sister   . Hyperlipidemia Sister   . Hypertension Sister   . Heart failure Maternal Grandfather   . Breast cancer Neg Hx   . Colon cancer Neg Hx   . Esophageal cancer Neg Hx   . Stomach cancer Neg Hx   . Rectal cancer Neg Hx     Social History   Socioeconomic History  . Marital status: Married    Spouse name: Not on file  . Number of children: 1  . Years of education: Not on file  . Highest education level: Not on file  Occupational History  . 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 Smoking status: Former Smoker    Quit date: 10/02/1985    Years since quitting: 35.4  . Smokeless tobacco: Never Used  . Tobacco comment: Remote- quit ~ late 1980's.  Vaping Use  . Vaping Use: Never used  Substance and Sexual Activity  . Alcohol use: No    Alcohol/week: 0.0 standard drinks  . Drug use: No  . Sexual activity: Yes    Partners: Male  Other Topics Concern  . Not on file  Social History Narrative   Lives with husband in [place].   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[employer], 3rd shift   Activity: no regular exercise - goes to gym   Diet: good water, fruits/vegetables daily    Social Determinants of Radio broadcast assistant Strain: Not on file  Food Insecurity: Not on file  Transportation Needs: Not on file  Physical Activity: Not on file  Stress: Not on file  Social Connections: Not on file  Intimate Partner Violence: Not on file    Review of Systems:    Constitutional: No weight loss, fever or chills Cardiovascular: No chest pain Respiratory: No SOB  Gastrointestinal: See HPI and otherwise negative   Physical Exam:  Vital signs: BP 120/70   Pulse 69   Ht '5\' 3"'  (1.6 m)   Wt 138 lb (62.6 kg)   LMP  (LMP Unknown)   BMI 24.45 kg/m   Constitutional:   Pleasant AA female appears to be in NAD, Well developed, Well nourished, alert and cooperative Respiratory: Respirations even and unlabored. Lungs clear to auscultation bilaterally.   No wheezes, crackles, or rhonchi.  Cardiovascular: Normal S1, S2. No MRG. Regular rate and rhythm. No peripheral edema, cyanosis or pallor.  Gastrointestinal:  Soft, nondistended, nontender. No rebound or guarding. Normal bowel sounds. No appreciable masses or hepatomegaly. Rectal:  Not performed.  Psychiatric: Oriented to person, place and time. Demonstrates good judgement and reason without abnormal affect or behaviors.  RELEVANT LABS AND IMAGING: CBC    Component Value Date/Time   WBC 6.7 01/26/2021 1116   RBC 4.62 01/26/2021 1116   HGB 12.4 01/26/2021 1116   HGB 8.7 10/30/2013 0000   HCT 38.9 01/26/2021 1116   HCT 29.8 (L) 10/23/2013 1918   HCT 38 01/10/2012 0000   PLT 221.0 01/26/2021 1116   PLT 214 10/23/2013 1918   MCV  84.1 01/26/2021 1116   MCV 83 10/23/2013 1918   MCH 27.3 12/08/2015 0905   MCHC 31.8 01/26/2021 1116   RDW 14.0 01/26/2021 1116   RDW 13.5 10/23/2013 1918   LYMPHSABS 0.8 01/26/2021 1116   LYMPHSABS 1.2 10/23/2013 1918   MONOABS 0.5 01/26/2021 1116   MONOABS 0.6 10/23/2013 1918   EOSABS 0.2 01/26/2021 1116   EOSABS 0.1 10/23/2013 1918   BASOSABS 0.0 01/26/2021 1116   BASOSABS  0.0 10/23/2013 1918    CMP     Component Value Date/Time   NA 140 01/26/2021 1116   NA 140 10/30/2013 0000   NA 136 10/23/2013 1918   K 4.2 01/26/2021 1116   K 4.5 10/30/2013 0000   CL 106 01/26/2021 1116   CL 104 10/23/2013 1918   CL 102 01/10/2012 0000   CO2 23 01/26/2021 1116   CO2 25 10/23/2013 1918   GLUCOSE 130 (H) 01/26/2021 1116   GLUCOSE 150 (H) 10/23/2013 1918   BUN 40 (H) 01/26/2021 1116   BUN 88 (H) 10/23/2013 1918   CREATININE 1.33 (H) 01/26/2021 1116   CREATININE 6.79 10/30/2013 0000   CALCIUM 10.0 01/26/2021 1116   CALCIUM 9.1 10/23/2013 1918   CALCIUM 9.6 01/10/2012 0000   PROT 7.3 01/26/2021 1116   PROT 7.7 10/23/2013 1918   ALBUMIN 4.4 01/26/2021 1116   ALBUMIN 3.9 10/23/2013 1918   AST 14 01/26/2021 1116   AST 17 10/23/2013 1918   ALT 14 01/26/2021 1116   ALT 28 10/23/2013 1918   ALKPHOS 64 01/26/2021 1116   ALKPHOS 70 10/23/2013 1918   BILITOT 0.5 01/26/2021 1116   BILITOT 0.3 10/23/2013 1918   GFRNONAA 4 (L) 12/08/2015 0905   GFRNONAA 6 (L) 10/23/2013 1918   GFRAA 5 (L) 12/08/2015 0905   GFRAA 7 (L) 10/23/2013 1918    Assessment: 1.  Fecal incontinence: At least 3 times a week, no better when she had done a bowel purge; likely pelvic floor dysfunction 2.  Chronic constipation: Some better when she uses MiraLAX but this has not been titrated correctly  Plan: 1.  Discussed with patient that I thought she was on MiraLAX once a day prior to recent x-ray, but she tells me she is really not using this on a regular basis.  Explained that she needs to find an amount that she can use every day that does not have to make her have super loose stool but just assist in her going to the bathroom.  She verbalized understanding.  I explained this could be just a half a dose or just a teaspoon in her coffee in the morning. 2.  Patient's largest concern again is this fecal incontinence.  I do feel there is likely an element of pelvic floor dysfunction.  Scheduled  the patient for an anal manometry for further evaluation.  Discussed that pending results of this could refer her to pelvic floor therapy versus other. 3.  Patient to follow in clinic per recommendations after anal manometry.  Ellouise Newer, PA-C [facility] 03/07/2021, 11:08 AM  Cc: Ria Bush, MD

## 2021-03-10 NOTE — Progress Notes (Signed)
Reviewed and agree with documentation and assessment and plan. [name] , MD   

## 2021-03-11 ENCOUNTER — Ambulatory Visit: Payer: Medicare Other

## 2021-03-16 ENCOUNTER — Encounter (HOSPITAL_COMMUNITY)
Admission: RE | Disposition: A | Discharge status: Home / Self Care | Payer: Self-pay | Source: Home / Self Care | Attending: Gastroenterology

## 2021-03-16 ENCOUNTER — Ambulatory Visit (HOSPITAL_COMMUNITY)
Admission: RE | Admit: 2021-03-16 | Discharge: 2021-03-16 | Disposition: A | Discharge status: Home / Self Care | Payer: Medicare Other | Attending: Gastroenterology | Admitting: Gastroenterology

## 2021-03-16 DIAGNOSIS — K5902 Outlet dysfunction constipation: Secondary | ICD-10-CM | POA: Diagnosis not present

## 2021-03-16 DIAGNOSIS — R159 Full incontinence of feces: Secondary | ICD-10-CM | POA: Diagnosis not present

## 2021-03-16 DIAGNOSIS — K5909 Other constipation: Secondary | ICD-10-CM | POA: Insufficient documentation

## 2021-03-16 HISTORY — PX: ANAL RECTAL MANOMETRY: SHX6358

## 2021-03-16 SURGERY — MANOMETRY, ANORECTAL

## 2021-03-16 NOTE — Progress Notes (Addendum)
Anal manometry done per protocol. Balloon expulsion test performed. Patient expelled balloon in 20 sec. Pt tolerated test well without distress or complication .

## 2021-03-17 ENCOUNTER — Encounter (HOSPITAL_COMMUNITY): Payer: Self-pay | Admitting: Gastroenterology

## 2021-03-29 ENCOUNTER — Ambulatory Visit: Payer: Medicare Other | Admitting: Family Medicine

## 2021-03-29 DIAGNOSIS — Z940 Kidney transplant status: Secondary | ICD-10-CM | POA: Diagnosis not present

## 2021-04-04 DIAGNOSIS — K5902 Outlet dysfunction constipation: Secondary | ICD-10-CM

## 2021-04-08 ENCOUNTER — Other Ambulatory Visit: Payer: Self-pay

## 2021-04-08 ENCOUNTER — Ambulatory Visit
Admission: RE | Admit: 2021-04-08 | Discharge: 2021-04-08 | Disposition: A | Discharge status: Home / Self Care | Payer: Medicare Other | Source: Ambulatory Visit | Attending: Family Medicine | Admitting: Family Medicine

## 2021-04-08 ENCOUNTER — Encounter: Payer: Self-pay | Admitting: Family Medicine

## 2021-04-08 ENCOUNTER — Ambulatory Visit (INDEPENDENT_AMBULATORY_CARE_PROVIDER_SITE_OTHER): Payer: Medicare Other | Admitting: Family Medicine

## 2021-04-08 VITALS — BP 134/70 | HR 73 | Temp 97.6°F | Ht 62.0 in | Wt 140.1 lb

## 2021-04-08 DIAGNOSIS — K5902 Outlet dysfunction constipation: Secondary | ICD-10-CM | POA: Diagnosis not present

## 2021-04-08 DIAGNOSIS — R109 Unspecified abdominal pain: Secondary | ICD-10-CM | POA: Insufficient documentation

## 2021-04-08 DIAGNOSIS — E1121 Type 2 diabetes mellitus with diabetic nephropathy: Secondary | ICD-10-CM | POA: Diagnosis not present

## 2021-04-08 DIAGNOSIS — M2170 Unequal limb length (acquired), unspecified site: Secondary | ICD-10-CM

## 2021-04-08 DIAGNOSIS — H538 Other visual disturbances: Secondary | ICD-10-CM

## 2021-04-08 DIAGNOSIS — Z940 Kidney transplant status: Secondary | ICD-10-CM

## 2021-04-08 DIAGNOSIS — R159 Full incontinence of feces: Secondary | ICD-10-CM | POA: Diagnosis not present

## 2021-04-08 DIAGNOSIS — N3946 Mixed incontinence: Secondary | ICD-10-CM | POA: Diagnosis not present

## 2021-04-08 DIAGNOSIS — R152 Fecal urgency: Secondary | ICD-10-CM

## 2021-04-08 LAB — POCT GLYCOSYLATED HEMOGLOBIN (HGB A1C): Hemoglobin A1C: 7 % — AB (ref 4.0–5.6)

## 2021-04-08 NOTE — Assessment & Plan Note (Signed)
Saw sports medicine, now has L foot lift she can use in shoes - she is unsure how helpful this has been.

## 2021-04-08 NOTE — Assessment & Plan Note (Signed)
This has resolved. Had RUQ abd US done this morning - has not yet been read.

## 2021-04-08 NOTE — Patient Instructions (Addendum)
Vision screen today.  We will refer you for pelvic floor physical therapy at [facility] for ongoing bowel incontinence issues.  Return as needed or in 6 months for physical/wellness visit

## 2021-04-08 NOTE — Assessment & Plan Note (Addendum)
Ongoing difficulty - has seen urology s/p PTNS.  Will refer to PFPT.  Continues myrbetriq.

## 2021-04-08 NOTE — Assessment & Plan Note (Signed)
Chronic, previously diet controlled - will recheck A1c today

## 2021-04-08 NOTE — Assessment & Plan Note (Addendum)
Snellen checked today - difficulty noted on left > right.  She will call for sooner f/u with ophthalmology in known [place] after recent L cataract surgery, and will let me know if unable to get in relatively soon.

## 2021-04-08 NOTE — Assessment & Plan Note (Signed)
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 Will refer to PFPT and consider surgery eval for interstim per GI recs.

## 2021-04-08 NOTE — Progress Notes (Signed)
Patient ID: Kristen Kirk, female    DOB: 01-12-1951, 70 y.o.   MRN: [id number]  This visit was conducted in person.  BP 134/70   Pulse 73   Temp 97.6 F (36.4 C) (Temporal)   Ht _0  (1.575 m)   Wt 140 lb 1 oz (63.5 kg)   LMP  (LMP Unknown)   SpO2 98%   BMI 25.62 kg/m   Vision Screening   Right eye Left eye Both eyes  Without correction     With correction _1     CC: 6 mo f/u visit  Subjective:   HPI: Kristen Kirk is a 70 y.o. female presenting on 04/08/2021 for Diabetes (Here for 6 mo f/u.)   Today noted some blurry vision - planning to follow up with her eye doctor. No double vision or photopsia. Few floaters. No hemianopsia.   Pending abd [place] later today for RUQ abdominal pain which has since resolved.   Saw Dr Lorelei Pont for possible leg length discrepancy (1.5cm) - placed in custom 1cm lift for left foot. She is unsure if this is helping.   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 I will refer to PFPT. Ongoing fecal incontinence despite daily miralax.   Lab Results  Component Value Date   HGBA1C 7.0 (A) 04/08/2021   Recent labs through [facility].      Relevant past medical, surgical, family and social history reviewed and updated as indicated. Interim medical history since our last visit reviewed. Allergies and medications reviewed and updated. Outpatient Medications Prior to Visit  Medication Sig Dispense Refill   alendronate (FOSAMAX) 70 MG tablet Take 70 mg by mouth once a week. Take with a full glass of water on an empty stomach.     apraclonidine (IOPIDINE) 0.5 % ophthalmic solution Place 1 drop into the left eye 2 (two) times daily.     aspirin 81 MG tablet Take 81 mg by mouth daily.     Blood Glucose Monitoring Suppl (ONE TOUCH ULTRA SYSTEM KIT) W/DEVICE KIT 1 kit by Does not apply route once. 1  each 0   carvedilol (COREG) 25 MG tablet Take 1.5 tablets (37.5 mg total) by mouth 2 (two) times daily with a meal.     Cholecalciferol (VITAMIN D3) 1000 units CAPS Take 2 capsules (2,000 Units total) by mouth daily. 60 capsule    docusate sodium (COLACE) 100 MG capsule Take 100 mg by mouth 2 (two) times daily as needed.   0   dorzolamide-timolol (COSOPT) 22.3-6.8 MG/ML ophthalmic solution Place 1 drop into both eyes 3 (three) times daily.     famotidine (PEPCID) 20 MG tablet Take 1 tablet (20 mg total) by mouth 2 (two) times daily.     glucose blood test strip Check sugars once daily 100 each 11   hydrochlorothiazide (HYDRODIURIL) 12.5 MG tablet Take 1 tablet (12.5 mg total) by mouth daily. 90 tablet 3   Melatonin 3 MG CAPS Take 1-2 capsules (3-6 mg total) by mouth at bedtime as needed (sleep).  0   mirabegron ER (MYRBETRIQ) 25 MG TB24 tablet Take 25 mg by mouth daily.     Multiple Vitamin (MULTIVITAMIN) tablet Take 1 tablet by mouth daily.     mycophenolate (MYFORTIC) 180 MG EC tablet Take 2 tablets (360 mg total) by mouth 2 (two) times daily.     pantoprazole (  PROTONIX) 20 MG tablet TAKE 1 TABLET BY MOUTH EVERY DAY 90 tablet 1   polyethylene glycol powder (GLYCOLAX/MIRALAX) 17 GM/SCOOP powder Take 8.5 g by mouth daily. Hold for diarrhea 3350 g 1   pravastatin (PRAVACHOL) 80 MG tablet TAKE 1 TABLET BY MOUTH  EVERY EVENING 90 tablet 0   tacrolimus (PROGRAF) 0.5 MG capsule 2 tabs in am and 2 at night     travoprost, benzalkonium, (TRAVATAN) 0.004 % ophthalmic solution Place 1 drop into both eyes at bedtime.     cephALEXin (KEFLEX) 250 MG capsule Take 1 capsule (250 mg total) by mouth 4 (four) times daily. 20 capsule 0   No facility-administered medications prior to visit.     Per HPI unless specifically indicated in ROS section below Review of Systems  Objective:  BP 134/70   Pulse 73   Temp 97.6 F (36.4 C) (Temporal)   Ht _0  (1.575 m)   Wt 140 lb 1 oz (63.5 kg)   LMP  (LMP  Unknown)   SpO2 98%   BMI 25.62 kg/m   Wt Readings from Last 3 Encounters:  04/08/21 140 lb 1 oz (63.5 kg)  03/07/21 138 lb (62.6 kg)  02/03/21 137 lb 8 oz (62.4 kg)      Physical Exam Vitals and nursing note reviewed.  Constitutional:      Appearance: Normal appearance. She is not ill-appearing.  Cardiovascular:     Rate and Rhythm: Normal rate and regular rhythm.     Pulses: Normal pulses.     Heart sounds: Normal heart sounds. No murmur heard. Pulmonary:     Effort: Pulmonary effort is normal. No respiratory distress.     Breath sounds: Normal breath sounds. No wheezing, rhonchi or rales.  Musculoskeletal:     Right lower leg: No edema.     Left lower leg: No edema.  Skin:    General: Skin is warm and dry.     Findings: No rash.  Neurological:     Mental Status: She is alert.  Psychiatric:        Mood and Affect: Mood normal.        Behavior: Behavior normal.      Results for orders placed or performed in visit on 04/08/21  POCT glycosylated hemoglobin (Hb A1C)  Result Value Ref Range   Hemoglobin A1C 7.0 (A) 4.0 - 5.6 %   HbA1c POC (<> result, manual entry)     HbA1c, POC (prediabetic range)     HbA1c, POC (controlled diabetic range)      Assessment & Plan:  This visit occurred during the SARS-CoV-2 public health emergency.  Safety protocols were in place, including screening questions prior to the visit, additional usage of staff PPE, and extensive cleaning of exam room while observing appropriate contact time as indicated for disinfecting solutions.   Problem List Items Addressed This Visit       Acute   Incontinence of feces    Recent anal manometry showed weak internal anal sphincter as well as dyssynergic defecation. Will refer to PFPT and consider surgery eval for interstim per GI recs.        Relevant Orders   Ambulatory referral to Physical Therapy     Other   Controlled type 2 diabetes mellitus with diabetic nephropathy ([place]) - Primary     Chronic, previously diet controlled - will recheck A1c today        Relevant Orders   POCT glycosylated hemoglobin (Hb A1C) (Completed)  Mixed incontinence urge and stress    Ongoing difficulty - has seen urology s/p PTNS.  Will refer to PFPT.  Continues myrbetriq.        Relevant Orders   Ambulatory referral to Physical Therapy   Right sided abdominal pain    This has resolved. Had RUQ abd US done this morning - has not yet been read.        Kidney transplant status   Acquired leg length discrepancy    Saw sports medicine, now has L foot lift she can use in shoes - she is unsure how helpful this has been.        Blurry vision    Snellen checked today - difficulty noted on left > right.  She will call for sooner f/u with ophthalmology in known [place] after recent L cataract surgery, and will let me know if unable to get in relatively soon.          No orders of the defined types were placed in this encounter.  Orders Placed This Encounter  Procedures   Ambulatory referral to Physical Therapy    Referral Priority:   Routine    Referral Type:   Physical Medicine    Referral Reason:   Specialty Services Required    Requested Specialty:   Physical Therapy    Number of Visits Requested:   1   POCT glycosylated hemoglobin (Hb A1C)     Patient Instructions  Vision screen today.  We will refer you for pelvic floor physical therapy at [facility] for ongoing bowel incontinence issues.  Return as needed or in 6 months for physical/wellness visit   Follow up plan: Return in about 6 months (around 10/09/2021), or if symptoms worsen or fail to improve, for annual exam, prior fasting for blood work, medicare wellness visit.  Ria Bush, MD

## 2021-04-12 ENCOUNTER — Telehealth: Payer: Self-pay

## 2021-04-12 NOTE — Telephone Encounter (Signed)
Spoke with the patient. Advised of normal esophageal manometry. Advised of impression anal manometry showing weak internal sphincter and evidence of dyssynergic defecation. Agrees to PT. She needs PT in the [place] area.

## 2021-04-13 ENCOUNTER — Other Ambulatory Visit: Payer: Self-pay

## 2021-04-13 DIAGNOSIS — K5902 Outlet dysfunction constipation: Secondary | ICD-10-CM

## 2021-04-13 DIAGNOSIS — R159 Full incontinence of feces: Secondary | ICD-10-CM

## 2021-04-13 DIAGNOSIS — K6289 Other specified diseases of anus and rectum: Secondary | ICD-10-CM

## 2021-04-13 NOTE — Telephone Encounter (Signed)
[place] [facility] has the PT needed. Referral placed through Epic. Attempted to contact the patient. Female answering the phone tells me she is asleep.

## 2021-04-14 DIAGNOSIS — Z940 Kidney transplant status: Secondary | ICD-10-CM | POA: Diagnosis not present

## 2021-04-14 NOTE — Telephone Encounter (Signed)
Referral to physical therapy in [place] at [facility]. Confirmed they do the PT needed for her diagnosis.

## 2021-04-20 ENCOUNTER — Telehealth: Payer: Self-pay

## 2021-04-20 MED ORDER — TIZANIDINE HCL 2 MG PO TABS: 2.0000 mg | ORAL_TABLET | Freq: Two times a day (BID) | ORAL | 0 refills | Status: DC | PRN

## 2021-04-20 NOTE — Telephone Encounter (Signed)
Attempted to contact pt.  No answer.  No vm.  Need to relay Dr. G's message.  

## 2021-04-20 NOTE — Telephone Encounter (Signed)
Pt said last year she had neck and rt shoulder pain and was given muscle relaxant (pt does not remember name of med) and it helped the pain. Pt said she started on 04/17/21 with constant sharp and dull pain in rt side of neck and rt shoulder; no injury and pt said could not give reason for pain to start;no lifting or pulling. Pt does not have CP or SOB. Pt had Diabetic FU on 04/08/21 and pts husband is at the [facility] hospital and pt wants to know if could get refill of muscle relaxant to [facility] [address] or will pt need to be seen again. Pt request cb after reviewed by Dr Robbi Garter note to DR Baldwin Crown CMA.

## 2021-04-20 NOTE — Telephone Encounter (Signed)
Ok to try tizanidine muscle relaxant sent in - if no arm numbness/weakness.  If not improvin with this, rec OV for evaluation.

## 2021-04-21 NOTE — Telephone Encounter (Signed)
Pt returning call.  I relayed Dr. Synthia Innocent message.  Pt verbalizes understanding and expresses her thanks.

## 2021-04-27 ENCOUNTER — Other Ambulatory Visit: Payer: Self-pay

## 2021-04-27 DIAGNOSIS — K5902 Outlet dysfunction constipation: Secondary | ICD-10-CM

## 2021-04-27 DIAGNOSIS — R159 Full incontinence of feces: Secondary | ICD-10-CM

## 2021-04-27 DIAGNOSIS — K6289 Other specified diseases of anus and rectum: Secondary | ICD-10-CM

## 2021-04-27 NOTE — Telephone Encounter (Signed)
I have sent the referral again but to [place] this time. Did not find the original referral in the work queue list. Not clear why this happened.

## 2021-04-27 NOTE — Telephone Encounter (Signed)
Kristen Kirk have you heard anything about this referral?

## 2021-04-27 NOTE — Telephone Encounter (Signed)
Pt called stating that she has not received a call to schedule PT yet. Pls call her.

## 2021-04-28 ENCOUNTER — Telehealth: Payer: Self-pay

## 2021-04-28 NOTE — Telephone Encounter (Signed)
Noted.  Spoke with pt scheduling OV tomorrow at 2:00 with [facility].

## 2021-04-28 NOTE — Telephone Encounter (Signed)
Vm from pt stating Dr. Darnell Level gave her tizanidine for pain in right shoulder but it's not helping.  Pain is so bad she is having trouble sleeping.  Wants to know should she double dose or does she need another OV.  Plz call back at [phone].

## 2021-04-28 NOTE — Telephone Encounter (Signed)
Recommend OV with available provider as we have not recently evaluated her for shoulder pain.  As per last phone note, plan was trial of tizanidine and if no better then OV.

## 2021-04-29 ENCOUNTER — Encounter: Payer: Self-pay | Admitting: Nurse Practitioner

## 2021-04-29 ENCOUNTER — Other Ambulatory Visit: Payer: Self-pay

## 2021-04-29 ENCOUNTER — Ambulatory Visit (INDEPENDENT_AMBULATORY_CARE_PROVIDER_SITE_OTHER): Payer: Medicare Other | Admitting: Nurse Practitioner

## 2021-04-29 ENCOUNTER — Ambulatory Visit
Admission: RE | Admit: 2021-04-29 | Discharge: 2021-04-29 | Disposition: A | Discharge status: Home / Self Care | Payer: Medicare Other | Source: Ambulatory Visit | Attending: Nurse Practitioner | Admitting: Nurse Practitioner

## 2021-04-29 VITALS — BP 130/80 | HR 77 | Temp 98.2°F | Ht 62.0 in | Wt 137.8 lb

## 2021-04-29 DIAGNOSIS — M25511 Pain in right shoulder: Secondary | ICD-10-CM

## 2021-04-29 DIAGNOSIS — Z940 Kidney transplant status: Secondary | ICD-10-CM

## 2021-04-29 MED ORDER — LIDOCAINE 5 % EX PTCH: 1.0000 | MEDICATED_PATCH | CUTANEOUS | 0 refills | Status: DC

## 2021-04-29 NOTE — Progress Notes (Signed)
Acute Office Visit  Subjective:    Patient ID: Kristen Kirk, female    DOB: April 24, 1951, 70 y.o.   MRN: [id number]  Chief Complaint  Patient presents with   Shoulder Pain    C/o persistent R shoulder pain.  Seen for previously and treated.  Recently tried tizanidine, not helpful.    Shoulder Pain  Pertinent negatives include no fever or numbness.  Patient is in today for Right shoulder pain. States have been going on for 2 weeks. States that the tizanidine isn't helping much. States that she has not tried anything over the counter. Pain is described deep ache that happens intermittently.  Patient states pain increases with movement and activity.  And rest helps some.  Patient has history of same.  States in the past muscle relaxants have been beneficial.  Does have a history of kidney transplant.  Patient report happened in 2018.  Is followed by [facility] per patient.     This visit occurred during the SARS-CoV-2 public health emergency.  Safety protocols were in place, including screening questions prior to the visit, additional usage of staff PPE, and extensive cleaning of exam room while observing appropriate contact time as indicated for disinfecting solutions.  Past Medical History:  Diagnosis Date   Anemia in chronic kidney disease    a. s/p aranesp 11/2013 now starting epo with HD 06/2014   Diabetes mellitus type II    a. Currently diet controlled since losing weight.   DJD (degenerative joint disease)    ESRD (end stage renal disease) ([place])    a. Followed @ [facility];  b. On dialysis since late 2015 (MWF).   ESRD (end stage renal disease) on dialysis [facility])    stage 5 per [facility] (Dr. Daphane Shepherd) considering transplant Access: LUE fistula Establishing with [facility]    Essential hypertension    GERD (gastroesophageal reflux disease)    Glaucoma    a. L>R   History of echocardiogram    a. 01/2015 Echo [facility]): EF ?55%, triv MR, mildly dil LA, nl PV.   History of stress test     a. 03/2013 Myoview [facility]): EF 63%, no ischemia.   History of Urge Incontinence    HLD (hyperlipidemia)    Osteoarthritis    Osteoporosis 05/03/2019   DEXA 04/2019 - 1.5 spine, -2.1 hip, -2.5 forearm Check phosphate and PTH to guide further management in h/o kidney transplant   Syncope    a. 12/2015.   Vaginal atrophy    a. vagifem caused spotting, normal TVS   Vitamin D deficiency     Past Surgical History:  Procedure Laterality Date   ANAL RECTAL MANOMETRY N/A 03/16/2021   Procedure: ANO RECTAL MANOMETRY;  Surgeon: Mauri Pole, MD;  Location: [facility];  Service: Endoscopy;  Laterality: N/A;   BLADDER SUSPENSION  2011   for stress incontinence-Dr. MacDiarmid   BREAST BIOPSY Right 12/15/2020   "Q" clip @ 2 o'clock - fat necrosis   BREAST BIOPSY Right 12/15/2020   "X" clip @ 4 o'clock - fat necrosis   CATARACT EXTRACTION Right    CATARACT EXTRACTION Left 02/25/2021   COLONOSCOPY  02/04/2009   normal, rec rpt 10 yrs   COLONOSCOPY  03/2019   diverticulosis, rpt 10 yrs (Nandigam)   KIDNEY TRANSPLANT Right 04/03/2017   [facility])   left eye laser surgery  06/2011   LIGATION OF ARTERIOVENOUS  FISTULA  10/2017   thrombosed L arm AFV   REFRACTIVE SURGERY  TUBAL LIGATION  1990s   bilateral    Family History  Problem Relation Age of Onset   Other Father        she did not know her father.   Diabetes Mother    Heart failure Mother        died @ 60   Stroke Brother    Hyperlipidemia Brother    Hypertension Brother    Hyperlipidemia Brother    Hypertension Brother    Hyperlipidemia Sister    Hypertension Sister    Hyperlipidemia Sister    Hypertension Sister    Heart failure Maternal Grandfather    Breast cancer Neg Hx    Colon cancer Neg Hx    Esophageal cancer Neg Hx    Stomach cancer Neg Hx    Rectal cancer Neg Hx     Social History   Socioeconomic History   Marital status: Married    Spouse name: Not on file   Number of children: 1   Years of  education: Not on file   Highest education level: Not on file  Occupational History   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  Vaping Use   Vaping Use: Never used  Substance and Sexual Activity   Alcohol use: No    Alcohol/week: 0.0 standard drinks   Drug use: No   Sexual activity: Yes    Partners: Male  Other Topics Concern   Not on file  Social History Narrative   Lives with husband in [place].   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 Take with a full glass of water on an empty stomach.     apraclonidine (IOPIDINE) 0.5 % ophthalmic solution Place 1 drop into the left eye 2 (two) times daily.     aspirin 81 MG tablet Take 81 mg by mouth daily.     Blood Glucose Monitoring Suppl (ONE TOUCH ULTRA SYSTEM KIT) W/DEVICE KIT 1 kit by Does not apply route once. 1 each 0   carvedilol (COREG) 25 MG tablet Take 1.5 tablets (37.5 mg total) by mouth 2 (two) times daily with a meal.     Cholecalciferol (VITAMIN D3) 1000 units CAPS Take 2 capsules (2,000 Units total) by mouth daily. 60 capsule    docusate sodium (COLACE) 100 MG capsule Take 100 mg by mouth 2 (two) times daily as needed.   0   dorzolamide-timolol (COSOPT) 22.3-6.8  MG/ML ophthalmic solution Place 1 drop into both eyes 3 (three) times daily.     famotidine (PEPCID) 20 MG tablet Take 1 tablet (20 mg total) by mouth 2 (two) times daily.     glucose  blood test strip Check sugars once daily 100 each 11   hydrochlorothiazide (HYDRODIURIL) 12.5 MG tablet Take 1 tablet (12.5 mg total) by mouth daily. 90 tablet 3   Melatonin 3 MG CAPS Take 1-2 capsules (3-6 mg total) by mouth at bedtime as needed (sleep).  0   mirabegron ER (MYRBETRIQ) 25 MG TB24 tablet Take 25 mg by mouth daily.     Multiple Vitamin (MULTIVITAMIN) tablet Take 1 tablet by mouth daily.     mycophenolate (MYFORTIC) 180 MG EC tablet Take 2 tablets (360 mg total) by mouth 2 (two) times daily.     pantoprazole (PROTONIX) 20 MG tablet TAKE 1 TABLET BY MOUTH EVERY DAY 90 tablet 1   polyethylene glycol powder (GLYCOLAX/MIRALAX) 17 GM/SCOOP powder Take 8.5 g by mouth daily. Hold for diarrhea 3350 g 1   pravastatin (PRAVACHOL) 80 MG tablet TAKE 1 TABLET BY MOUTH  EVERY EVENING 90 tablet 0   tacrolimus (PROGRAF) 0.5 MG capsule 2 tabs in am and 2 at night     tiZANidine (ZANAFLEX) 2 MG tablet Take 1 tablet (2 mg total) by mouth 2 (two) times daily as needed for muscle spasms (sedation precautions). 30 tablet 0   travoprost, benzalkonium, (TRAVATAN) 0.004 % ophthalmic solution Place 1 drop into both eyes at bedtime.     No facility-administered medications prior to visit.    Allergies  Allergen Reactions   Brimonidine     Other reaction(s): Other (See Comments) eye redness   Shellfish-Derived Products Swelling   Tramadol Other (See Comments)    Dizziness even at 1/2 tab    Review of Systems  Constitutional:  Negative for chills and fever.  Respiratory:  Negative for shortness of breath.   Cardiovascular:  Negative for chest pain.  Musculoskeletal:  Positive for arthralgias. Negative for joint swelling, neck pain and neck stiffness.  Skin:  Negative for color change.  Neurological:  Negative for  weakness and numbness.      Objective:    Physical Exam Vitals and nursing note reviewed.  Constitutional:      Appearance: Normal appearance.  Cardiovascular:     Rate and Rhythm: Normal rate and regular rhythm.  Pulmonary:     Effort: Pulmonary effort is normal.     Breath sounds: Normal breath sounds.  Abdominal:     General: Bowel sounds are normal.  Musculoskeletal:        General: Tenderness present. No signs of injury.     Right shoulder: No swelling, deformity, effusion or crepitus. Normal range of motion. Normal strength. Normal pulse.       Arms:     Cervical back: No rigidity or tenderness.     Comments: Empty can test negative Hawks test negative 5/5 bilateral upper extremity strength  Lymphadenopathy:     Cervical: No cervical adenopathy.  Skin:    General: Skin is warm.  Neurological:     General: No focal deficit present.     Mental Status: She is alert.     Deep Tendon Reflexes: Reflexes normal.    BP 130/80   Pulse 77   Temp 98.2 F (36.8 C) (Temporal)   Ht '5\' 2"'  (1.575 m)   Wt 137 lb 12 oz (62.5 kg)   LMP  (LMP Unknown)   SpO2 99%   BMI 25.19 kg/m  Wt Readings from Last 3 Encounters:  04/29/21 137 lb 12 oz (62.5 kg)  04/08/21 140 lb 1 oz (63.5 kg)  03/07/21 138 lb (62.6 kg)  Health Maintenance Due  Topic Date Due   OPHTHALMOLOGY EXAM  02/24/2015   FOOT EXAM  03/03/2015   TETANUS/TDAP  10/29/2017   COVID-19 Vaccine (4 - Booster for Pfizer series) 09/13/2020    There are no preventive care reminders to display for this patient.   Lab Results  Component Value Date   TSH 1.801 12/07/2015   Lab Results  Component Value Date   WBC 6.7 01/26/2021   HGB 12.4 01/26/2021   HCT 38.9 01/26/2021   MCV 84.1 01/26/2021   PLT 221.0 01/26/2021   Lab Results  Component Value Date   NA 140 01/26/2021   K 4.2 01/26/2021   CO2 23 01/26/2021   GLUCOSE 130 (H) 01/26/2021   BUN 40 (H) 01/26/2021   CREATININE 1.33 (H) 01/26/2021   BILITOT  0.5 01/26/2021   ALKPHOS 64 01/26/2021   AST 14 01/26/2021   ALT 14 01/26/2021   PROT 7.3 01/26/2021   ALBUMIN 4.4 01/26/2021   CALCIUM 10.0 01/26/2021   ANIONGAP 12 12/08/2015   GFR 41.14 (L) 01/26/2021   Lab Results  Component Value Date   CHOL 142 09/23/2020   Lab Results  Component Value Date   HDL 45.70 09/23/2020   Lab Results  Component Value Date   LDLCALC 81 09/23/2020   Lab Results  Component Value Date   TRIG 73.0 09/23/2020   Lab Results  Component Value Date   CHOLHDL 3 09/23/2020   Lab Results  Component Value Date   HGBA1C 7.0 (A) 04/08/2021       Assessment & Plan:   Problem List Items Addressed This Visit       Other   Right shoulder pain - Primary    Been going on approximately 2 weeks.  Did have phone conversations and tried tizanidine 2 mg.  Patient states not very effective.  Given history of renal transplant I do not feel that NSAIDs nor steroids would be an appropriate choice.  We will try topical lidocaine patches and OTC Tylenol to begin with.  Did check patient's most recent kidney function GFR looks great creatinine moderately elevated at 1.02 given that fact will stay on the lower side of dosing with Tylenol not to increase patient's creatinine level.  Check uric acid.  And obtain outpatient right shoulder x-ray.  Pending results. Patient has a history of steroid injections in her hip that were beneficial.  Was seen at the [facility] orthopedic clinic.  This may be the direction we need to had with this patient.       Relevant Medications   lidocaine (LIDODERM) 5 %   Other Relevant Orders   Uric acid   DG Shoulder Right   Kidney transplant status    States transplant 2018.  Followed by [facility] per patient report.  Given history of renal transplant will avoid NSAIDs.  Patient looks to be well controlled diabetic last A1c of 7.0.  Will avoid steroids at current moment.         No orders of the defined types were placed in this  encounter.    Romilda Garret, NP

## 2021-04-29 NOTE — Assessment & Plan Note (Signed)
States transplant 2018.  Followed by [facility] per patient report.  Given history of renal transplant will avoid NSAIDs.  Patient looks to be well controlled diabetic last A1c of 7.0.  Will avoid steroids at current moment.

## 2021-04-29 NOTE — Assessment & Plan Note (Addendum)
Been going on approximately 2 weeks.  Did have phone conversations and tried tizanidine 2 mg.  Patient states not very effective.  Given history of renal transplant I do not feel that NSAIDs nor steroids would be an appropriate choice.  We will try topical lidocaine patches and OTC Tylenol to begin with.  Did check patient's most recent kidney function GFR looks great creatinine moderately elevated at 1.02 given that fact will stay on the lower side of dosing with Tylenol not to increase patient's creatinine level.  Check uric acid.  And obtain outpatient right shoulder x-ray.  Pending results. Patient has a history of steroid injections in her hip that were beneficial.  Was seen at the [facility] orthopedic clinic.  This may be the direction we need to had with this patient.

## 2021-04-29 NOTE — Patient Instructions (Signed)
Can take tylenol over the counter. '650mg'$  2-3 times daily as needed for shoulder pain  Will check uric acid and try lidocaine patches. Get xray Follow up as scheduled, sooner if symptoms worsen or fail to improve

## 2021-04-30 LAB — URIC ACID: Uric Acid, Serum: 4.6 mg/dL (ref 2.5–7.0)

## 2021-05-02 ENCOUNTER — Other Ambulatory Visit: Payer: Self-pay | Admitting: Nurse Practitioner

## 2021-05-02 DIAGNOSIS — M25511 Pain in right shoulder: Secondary | ICD-10-CM

## 2021-05-05 ENCOUNTER — Ambulatory Visit: Payer: Medicare Other | Attending: Gastroenterology | Admitting: Physical Therapy

## 2021-05-05 ENCOUNTER — Telehealth: Payer: Self-pay | Admitting: Family Medicine

## 2021-05-05 ENCOUNTER — Other Ambulatory Visit: Payer: Self-pay

## 2021-05-05 ENCOUNTER — Encounter: Payer: Self-pay | Admitting: Physical Therapy

## 2021-05-05 DIAGNOSIS — M6281 Muscle weakness (generalized): Secondary | ICD-10-CM

## 2021-05-05 DIAGNOSIS — R278 Other lack of coordination: Secondary | ICD-10-CM | POA: Diagnosis not present

## 2021-05-05 NOTE — Therapy (Signed)
[facility] [address]. [place], [place], [address] Phone: [phone]   Fax:  [phone]  Physical Therapy Evaluation  Patient Details  Name: Kristen Kirk MRN: [id number] Date of Birth: [birth year] Referring Provider (PT): Harl Bowie   Encounter Date: 05/05/2021   PT End of Session - 05/05/21 1208     Visit Number 1    Number of Visits 12    Date for PT Re-Evaluation 07/28/21    Authorization Type IE 05/05/2021    PT Start Time 1210    PT Stop Time 1245    PT Time Calculation (min) 35 min    Activity Tolerance Patient tolerated treatment well    Behavior During Therapy [facility] for tasks assessed/performed             Past Medical History:  Diagnosis Date   Anemia in chronic kidney disease    a. s/p aranesp 11/2013 now starting epo with HD 06/2014   Diabetes mellitus type II    a. Currently diet controlled since losing weight.   DJD (degenerative joint disease)    ESRD (end stage renal disease) ([place])    a. Followed @ [facility];  b. On dialysis since late 2015 (MWF).   ESRD (end stage renal disease) on dialysis [facility])    stage 5 per [facility] renal (Dr. Daphane Shepherd) considering transplant Access: LUE fistula Establishing with [place] [facility]    Essential hypertension    GERD (gastroesophageal reflux disease)    Glaucoma    a. L>R   History of echocardiogram    a. 01/2015 Echo [facility]): EF ?55%, triv MR, mildly dil LA, nl PV.   History of stress test    a. 03/2013 Myoview [facility]): EF 63%, no ischemia.   History of Urge Incontinence    HLD (hyperlipidemia)    Osteoarthritis    Osteoporosis 05/03/2019   DEXA 04/2019 - 1.5 spine, -2.1 hip, -2.5 forearm Check phosphate and PTH to guide further management in h/o kidney transplant   Syncope    a. 12/2015.   Vaginal atrophy    a. vagifem caused spotting, normal TVS   Vitamin D deficiency     Past Surgical History:  Procedure Laterality Date   ANAL RECTAL MANOMETRY N/A 03/16/2021    Procedure: ANO RECTAL MANOMETRY;  Surgeon: Mauri Pole, MD;  Location: [facility];  Service: Endoscopy;  Laterality: N/A;   BLADDER SUSPENSION  2011   for stress incontinence-Dr. MacDiarmid   BREAST BIOPSY Right 12/15/2020   "Q" clip @ 2 o'clock - fat necrosis   BREAST BIOPSY Right 12/15/2020   "X" clip @ 4 o'clock - fat necrosis   CATARACT EXTRACTION Right    CATARACT EXTRACTION Left 02/25/2021   COLONOSCOPY  02/04/2009   normal, rec rpt 10 yrs   COLONOSCOPY  03/2019   diverticulosis, rpt 10 yrs (Nandigam)   KIDNEY TRANSPLANT Right 04/03/2017   [facility])   left eye laser surgery  06/2011   LIGATION OF ARTERIOVENOUS  FISTULA  10/2017   thrombosed L arm AFV   REFRACTIVE SURGERY     TUBAL LIGATION  1990s   bilateral    There were no vitals filed for this visit.      [facility] PHYSICAL THERAPY EVALUATION  SCREENING Red Flags:  Have you had any night sweats? Unexplained weight loss? Reports weight loss of 26 lbs over the past year Saddle anesthesia? Unexplained changes in bowel or bladder habits?  Precautions: osteoporosis  SUBJECTIVE  Chief Complaint: Patient reports that she has been dealing with both bowel and bladder leakage for several years, but patient finds bowel leakage most distressing and troublesome. Patient notes that she deals with constipation because of medications. Patient notes that she has leakage without urge. Patient has had bowel leakage of Type 1 stool.    Urinary History: Incontinence: Positive. Onset: 10-15 years Triggers: unaware and urge. Amount: Mod/Complete Loss.  Protective undergarments: Yes  Type: Poise, maximum  Number used/day: 3-5 Fluid Intake: 60-80 oz H20, glass of tea with dinner, coffee with cream caffeinated Nocturia: 4-5x/night Frequency of urination: every 1-2 hours Pain with urination: Negative Difficulty initiating urination: Negative Intermittent stream: Negative Frequent UTI: Negative.   Gastrointestinal  History: [place] Stool Chart: Type 7 or Type 1 Frequency of BMs: 2-3x/day; notes that this is dependent on eating. Patient notes that she can feel the urge and cannot stop the movement.  Pain with defecation: Negative Straining with defecation: Positive for sometimes Hemorrhoids: Negative Incontinence: Positive. Onset: 5 years Triggers: eating.  Amount: Mod/Complete Loss.  Sexual activity/pain: Patient notes pain with sexual activity; feels as though her partner cannot enter. Patient has not had any success with using lubrication.   Location of pain: n/a   OBJECTIVE  Mental Status Patient is oriented to person, place and time.  Recent memory is intact.  Remote memory is intact.  Attention span and concentration are intact.  Expressive speech is intact.  Patient's fund of knowledge is within normal limits for educational level.  POSTURE/OBSERVATIONS:  Lumbar lordosis: WNL Thoracic kyphosis: increased significantly Iliac crest height: R elevated on visual assessment   GAIT: Grossly WFL.  Trendelenburg R: Positive L: Positive  RANGE OF MOTION: deferred 2/2 to time constraints   LEFT RIGHT  Lumbar forward flexion (65):      Lumbar extension (30):     Lumbar lateral flexion (25):     Thoracic and Lumbar rotation (30 degrees):       Hip Flexion (0-125):      Hip IR (0-45):     Hip ER (0-45):     Hip Abduction (0-40):     Hip extension (0-15):       STRENGTH: MMT deferred 2/2 to time constraints  RLE LLE  Hip Flexion    Hip Extension    Hip Abduction     Hip Adduction     Hip ER     Hip IR     Knee Extension    Knee Flexion    Dorsiflexion     Plantarflexion (seated)     ABDOMINAL: deferred 2/2 to time constraints Palpation: Diastasis: Scar mobility: Rib flare:  SPECIAL TESTS: deferred 2/2 to time constraints   PHYSICAL PERFORMANCE MEASURES: STS: WNL   EXTERNAL PELVIC EXAM: deferred 2/2 to time constraints Palpation: Breath coordination: Voluntary  Contraction: present/absent Relaxation: full/delayed/non-relaxing Perineal movement with sustained IAP increase ("bear down"): descent/no change/elevation/excessive descent Perineal movement with rapid IAP increase ("cough"): elevation/no change/descent  INTERNAL VAGINAL EXAM: deferred 2/2 to time constraints Introitus Appears:  Skin integrity:  Scar mobility: Strength (PERF):  Symmetry: Palpation: Prolapse:   INTERNAL RECTAL EXAM: not indicated Strength (PERF): Symmetry: Palpation: Prolapse:   OUTCOME MEASURES: FOTO (deferred to next visit)   ASSESSMENT Patient is a 70 year old presenting to clinic with chief complaints of accidental bowel leakage. Today's evaluation is suggestive of deficits in PFM strength, PFM coordination, posture, and IAP management as evidenced by increased thoracic kyphosis, urinary and bowel leakage without sensory awareness, nocturia  4-5x, urinary leakage with urgency. Patient's progress may be limited due to persistence of complaint and complex medical history; however, patient's motivation is advantageous. Patient will benefit from continued skilled therapeutic intervention to address deficits in PFM strength, PFM coordination, posture, and IAP management in order to increase function and improve overall QOL.  EDUCATION Patient educated on prognosis, POC, and provided with HEP including: not initiated. Patient articulated understanding and returned demonstration. Patient will benefit from further education in order to maximize compliance and understanding for long-term therapeutic gains.   Objective measurements completed on examination: See above findings.        PT Long Term Goals - 05/06/21 1222       PT LONG TERM GOAL #1   Title Patient will demonstrate independence with HEP in order to maximize therapeutic gains and improve carryover from physical therapy sessions to ADLs in the home and community.    Baseline IE: not initiated    Time 12     Period Weeks    Status New    Target Date 07/28/21      PT LONG TERM GOAL #2   Title Patient will demonstrate circumferential and sequential contraction of >3/5 MMT, > 5 sec hold x5 and 5 consecutive quick flicks with </= 10 min rest between testing bouts, and relaxation of the PFM coordinated with breath for improved management of intra-abdominal pressure and normal bowel and bladder function without the presence of pain nor incontinence in order to improve participation at home and in the community.    Baseline IE: not demonstrated    Time 12    Period Weeks    Status New    Target Date 07/29/21      PT LONG TERM GOAL #3   Title Patient will report >3 days without episode of fecal incontinence/smearing for improved function and participation at home and in the community.    Baseline IE: multiple times/day    Time 12    Period Weeks    Status New    Target Date 07/28/21      PT LONG TERM GOAL #4   Title Patient will articulate and demonstrate osteoporosis specific body mechanics for improved posture and decreased risk of fracture.    Baseline IE: not demonstrated    Time 12    Period Weeks    Status New    Target Date 07/28/21                    Plan - 05/05/21 1208     Clinical Impression Statement Patient is a 70 year old presenting to clinic with chief complaints of accidental bowel leakage. Today's evaluation is suggestive of deficits in PFM strength, PFM coordination, posture, and IAP management as evidenced by increased thoracic kyphosis, urinary and bowel leakage without sensory awareness, nocturia 4-5x, urinary leakage with urgency. Patient's progress may be limited due to persistence of complaint and complex medical history; however, patient's motivation is advantageous. Patient will benefit from continued skilled therapeutic intervention to address deficits in PFM strength, PFM coordination, posture, and IAP management in order to increase function and improve  overall QOL.    Personal Factors and Comorbidities Comorbidity 3+;Age;Time since onset of injury/illness/exacerbation    Comorbidities hx of kidney transplant (2018), DM 2, OA, osteoporosis, vaginal atrophy    Examination-Activity Limitations Continence;Sleep;Toileting    Examination-Participation Restrictions Interpersonal Relationship;Cleaning;Laundry    Stability/Clinical Decision Making Evolving/Moderate complexity    Clinical Decision Making Moderate    Rehab Potential  Fair    PT Frequency 1x / week    PT Duration 12 weeks    PT Treatment/Interventions Cryotherapy;Electrical Stimulation;Moist Heat;Therapeutic exercise;Neuromuscular re-education;Patient/family education;Manual techniques;Scar mobilization;Taping    PT Next Visit Plan physical assessment    PT Home Exercise Plan not initiated    Consulted and Agree with Plan of Care Patient             Patient will benefit from skilled therapeutic intervention in order to improve the following deficits and impairments:  Postural dysfunction, Improper body mechanics, Decreased strength, Decreased activity tolerance, Decreased coordination, Decreased endurance, Pain, Impaired sensation  Visit Diagnosis: Muscle weakness (generalized)  Other lack of coordination     Problem List Patient Active Problem List   Diagnosis Date Noted   Blurry vision 04/08/2021   Constipation due to outlet dysfunction    Acquired leg length discrepancy 01/28/2021   ASCUS of cervix with negative high risk HPV 10/01/2020   Thoracic spine pain 01/05/2020   Chronic lower back pain 10/08/2019   Osteoporosis 05/03/2019   Advanced care planning/counseling discussion 03/20/2019   Memory impairment 03/20/2019   Right shoulder pain 06/19/2017   Kidney transplant status 04/01/2017   Rectal bleeding 01/11/2017   Incontinence of feces 01/11/2017    Class: Acute   Right sided abdominal pain 12/31/2015   Insomnia 01/14/2013   Xerostomia 01/29/2012    Medicare annual wellness visit, subsequent 07/20/2011   Vaginal atrophy 04/17/2011   Controlled type 2 diabetes mellitus with diabetic nephropathy ([place])    Hyperlipidemia associated with type 2 diabetes mellitus ([place])    GERD (gastroesophageal reflux disease)    HTN (hypertension)    Mixed incontinence urge and stress    Vitamin D deficiency 05/19/2009   MUSCLE CRAMPS 02/22/2009   ALOPECIA 10/05/2008   Vertigo 07/31/2008   OSTEOARTHRITIS- EARLY T11-12, T12-L1  4/05 01/03/2007   Primary open angle glaucoma (POAG) of both eyes, severe stage 10/03/1999    Myles Gip PT, DPT [phone]  05/06/2021, 12:25 PM  [place] [facility] [address]. [place], [place], [address] Phone: [phone]   Fax:  [phone]  Name: Kristen Kirk MRN: [id number] Date of Birth: Jul 13, 1951

## 2021-05-05 NOTE — Telephone Encounter (Signed)
Patient call stating the medication is not working wanted to know if something else can be call in for pain . Would like a call back   tiZANidine (ZANAFLEX) 2 MG table

## 2021-05-06 MED ORDER — ACETAMINOPHEN-CODEINE #3 300-30 MG PO TABS: 0.5000 | ORAL_TABLET | Freq: Two times a day (BID) | ORAL | 0 refills | Status: DC | PRN

## 2021-05-06 NOTE — Addendum Note (Signed)
Addended by: Ria Bush on: 05/06/2021 07:18 AM   Modules accepted: Orders

## 2021-05-06 NOTE — Telephone Encounter (Addendum)
Currently on tizanidine, lidocaine patches, and tylenol '650mg'$  TID.  Has she tried tylenol scheduled TID?  She has been referred to ortho - does she have appt? Can try tylenol with codeine - stronger pain medicine for breakthrough pain - although would have to be very careful as she had dizziness with low dose tramadol. Start with 1/2 tab

## 2021-05-06 NOTE — Telephone Encounter (Signed)
Spoke with pt relaying Dr. Synthia Innocent message.  Pt states she has been taking Tylenol 500 mg (only BID) but she will go pick up 650 mg and try that TID.  Also, pt hasn't been scheduled with ortho yet.

## 2021-05-12 ENCOUNTER — Other Ambulatory Visit: Payer: Self-pay

## 2021-05-12 ENCOUNTER — Ambulatory Visit: Payer: Medicare Other | Admitting: Physical Therapy

## 2021-05-12 ENCOUNTER — Encounter: Payer: Self-pay | Admitting: Physical Therapy

## 2021-05-12 DIAGNOSIS — R278 Other lack of coordination: Secondary | ICD-10-CM

## 2021-05-12 DIAGNOSIS — N3946 Mixed incontinence: Secondary | ICD-10-CM | POA: Diagnosis not present

## 2021-05-12 DIAGNOSIS — F980 Enuresis not due to a substance or known physiological condition: Secondary | ICD-10-CM | POA: Diagnosis not present

## 2021-05-12 DIAGNOSIS — M6281 Muscle weakness (generalized): Secondary | ICD-10-CM

## 2021-05-12 NOTE — Therapy (Addendum)
[facility] [address] [place], [place], [address] Phone: [phone]   Fax:  [phone]  Physical Therapy Treatment  Patient Details  Name: Kristen Kirk MRN: [id number] Date of Birth: 12-07-1950 Referring Provider (PT): Harl Bowie   Encounter Date: 05/12/2021   PT End of Session - 05/12/21 1211     Visit Number 2    Number of Visits 12    Date for PT Re-Evaluation 07/28/21    Authorization Type IE 05/05/2021    PT Start Time 1207    PT Stop Time 1250    PT Time Calculation (min) 43 min    Activity Tolerance Patient tolerated treatment well    Behavior During Therapy [facility] for tasks assessed/performed             Past Medical History:  Diagnosis Date   Anemia in chronic kidney disease    a. s/p aranesp 11/2013 now starting epo with HD 06/2014   Diabetes mellitus type II    a. Currently diet controlled since losing weight.   DJD (degenerative joint disease)    ESRD (end stage renal disease) ([place])    a. Followed @ [facility];  b. On dialysis since late 2015 (MWF).   ESRD (end stage renal disease) on dialysis [facility])    stage 5 per [facility] renal (Dr. Daphane Shepherd) considering transplant Access: LUE fistula Establishing with [place] [facility]    Essential hypertension    GERD (gastroesophageal reflux disease)    Glaucoma    a. L>R   History of echocardiogram    a. 01/2015 Echo [facility]): EF ?55%, triv MR, mildly dil LA, nl PV.   History of stress test    a. 03/2013 Myoview [facility]): EF 63%, no ischemia.   History of Urge Incontinence    HLD (hyperlipidemia)    Osteoarthritis    Osteoporosis 05/03/2019   DEXA 04/2019 - 1.5 spine, -2.1 hip, -2.5 forearm Check phosphate and PTH to guide further management in h/o kidney transplant   Syncope    a. 12/2015.   Vaginal atrophy    a. vagifem caused spotting, normal TVS   Vitamin D deficiency     Past Surgical History:  Procedure Laterality Date   ANAL RECTAL MANOMETRY N/A 03/16/2021    Procedure: ANO RECTAL MANOMETRY;  Surgeon: Mauri Pole, MD;  Location: [facility];  Service: Endoscopy;  Laterality: N/A;   BLADDER SUSPENSION  2011   for stress incontinence-Dr. MacDiarmid   BREAST BIOPSY Right 12/15/2020   "Q" clip @ 2 o'clock - fat necrosis   BREAST BIOPSY Right 12/15/2020   "X" clip @ 4 o'clock - fat necrosis   CATARACT EXTRACTION Right    CATARACT EXTRACTION Left 02/25/2021   COLONOSCOPY  02/04/2009   normal, rec rpt 10 yrs   COLONOSCOPY  03/2019   diverticulosis, rpt 10 yrs (Nandigam)   KIDNEY TRANSPLANT Right 04/03/2017   [facility])   left eye laser surgery  06/2011   LIGATION OF ARTERIOVENOUS  FISTULA  10/2017   thrombosed L arm AFV   REFRACTIVE SURGERY     TUBAL LIGATION  1990s   bilateral    There were no vitals filed for this visit.   Subjective Assessment - 05/12/21 1210     Subjective Patient notes no siginifcant changes since last session. Patient did bump into the door on the way into tthe clinic and feels some tenderness on the R breast/chest as a result.    Currently in  Pain? Yes    Pain Score 3     Pain Location Breast    Pain Orientation Right            TREATMENT  Pre-treatment assessment: R IC elevated and anterior rotation in comparison to L.  RANGE OF MOTION:    LEFT RIGHT  Lumbar forward flexion (65):  WFL, hamstring compensations    Lumbar extension (30): negligible    Lumbar lateral flexion (25):  [facility]  Thoracic and Lumbar rotation (30 degrees):    [facility] WFL  Hip Flexion (0-125):   WNL WNL  Hip IR (0-45):  15 15  Hip ER (0-45):  30 30  Hip Abduction (0-40):  WNL WNL  Stretch/discomfort with PROM hip abduction in adductor mm.   SENSATION: Grossly intact to light touch bilateral LEs as determined by testing dermatomes L2-S2 Proprioception and hot/cold testing deferred on this date  STRENGTH: MMT   RLE LLE  Hip Flexion 4 4  Hip Extension 4 5  Knee Extension 4 4  Knee Flexion 4 4  Dorsiflexion  4 4   Plantarflexion (seated) 4 4   ABDOMINAL:  Palpation: TTP of RLQ Diastasis: 3 finger at umbilicus and inferior Scar mobility: deep restriction noted R of scar  SPECIAL TESTS: FABER (SN 81): R: Negative L: Negative FADIR (SN 94): R: Negative L: Negative  Manual Therapy: Scar mobilization at RLQ to allow for improved mobility and function.   Neuromuscular Re-education: Seated mermaid stretch, R for improved posture.  Heel lift donning/doffing on L with gait and standing posture for improved pelvic posture.   Patient educated throughout session on appropriate technique and form using multi-modal cueing, HEP, and activity modification. Patient articulated understanding and returned demonstration.  Patient Response to interventions: Enjoyed stretch.  ASSESSMENT Patient presents to clinic with excellent motivation to participate in therapy. Patient demonstrates deficits in PFM strength, PFM coordination, posture, and IAP management. Patient able to achieve appropriate sequencing of R side stretch with minimal cueing during today's session and responded positively to educational interventions. Patient will benefit from continued skilled therapeutic intervention to address remaining deficits in PFM strength, PFM coordination, posture, and IAP management in order to increase function and improve overall QOL.     PT Long Term Goals - 05/06/21 1222       PT LONG TERM GOAL #1   Title Patient will demonstrate independence with HEP in order to maximize therapeutic gains and improve carryover from physical therapy sessions to ADLs in the home and community.    Baseline IE: not initiated    Time 12    Period Weeks    Status New    Target Date 07/28/21      PT LONG TERM GOAL #2   Title Patient will demonstrate circumferential and sequential contraction of >3/5 MMT, > 5 sec hold x5 and 5 consecutive quick flicks with </= 10 min rest between testing bouts, and relaxation of the PFM coordinated  with breath for improved management of intra-abdominal pressure and normal bowel and bladder function without the presence of pain nor incontinence in order to improve participation at home and in the community.    Baseline IE: not demonstrated    Time 12    Period Weeks    Status New    Target Date 07/29/21      PT LONG TERM GOAL #3   Title Patient will report >3 days without episode of fecal incontinence/smearing for improved function and participation at home and in  the community.    Baseline IE: multiple times/day    Time 12    Period Weeks    Status New    Target Date 07/28/21      PT LONG TERM GOAL #4   Title Patient will articulate and demonstrate osteoporosis specific body mechanics for improved posture and decreased risk of fracture.    Baseline IE: not demonstrated    Time 12    Period Weeks    Status New    Target Date 07/28/21                   Plan - 05/12/21 1211     Clinical Impression Statement Patient presents to clinic with excellent motivation to participate in therapy. Patient demonstrates deficits in PFM strength, PFM coordination, posture, and IAP management. Patient able to achieve appropriate sequencing of R side stretch with minimal cueing during today's session and responded positively to educational interventions. Patient will benefit from continued skilled therapeutic intervention to address remaining deficits in PFM strength, PFM coordination, posture, and IAP management in order to increase function and improve overall QOL.    Personal Factors and Comorbidities Comorbidity 3+;Age;Time since onset of injury/illness/exacerbation    Comorbidities hx of kidney transplant (2018), DM 2, OA, osteoporosis, vaginal atrophy    Examination-Activity Limitations Continence;Sleep;Toileting    Examination-Participation Restrictions Interpersonal Relationship;Cleaning;Laundry    Stability/Clinical Decision Making Evolving/Moderate complexity    Rehab Potential  Fair    PT Frequency 1x / week    PT Duration 12 weeks    PT Treatment/Interventions Cryotherapy;Electrical Stimulation;Moist Heat;Therapeutic exercise;Neuromuscular re-education;Patient/family education;Manual techniques;Scar mobilization;Taping    PT Next Visit Plan external PFM    PT Home Exercise Plan mermaid stretch    Consulted and Agree with Plan of Care Patient             Patient will benefit from skilled therapeutic intervention in order to improve the following deficits and impairments:  Postural dysfunction, Improper body mechanics, Decreased strength, Decreased activity tolerance, Decreased coordination, Decreased endurance, Pain, Impaired sensation  Visit Diagnosis: Muscle weakness (generalized)  Other lack of coordination     Problem List Patient Active Problem List   Diagnosis Date Noted   Blurry vision 04/08/2021   Constipation due to outlet dysfunction    Acquired leg length discrepancy 01/28/2021   ASCUS of cervix with negative high risk HPV 10/01/2020   Thoracic spine pain 01/05/2020   Chronic lower back pain 10/08/2019   Osteoporosis 05/03/2019   Advanced care planning/counseling discussion 03/20/2019   Memory impairment 03/20/2019   Right shoulder pain 06/19/2017   Kidney transplant status 04/01/2017   Rectal bleeding 01/11/2017   Incontinence of feces 01/11/2017    Class: Acute   Right sided abdominal pain 12/31/2015   Insomnia 01/14/2013   Xerostomia 01/29/2012   Medicare annual wellness visit, subsequent 07/20/2011   Vaginal atrophy 04/17/2011   Controlled type 2 diabetes mellitus with diabetic nephropathy ([place])    Hyperlipidemia associated with type 2 diabetes mellitus ([place])    GERD (gastroesophageal reflux disease)    HTN (hypertension)    Mixed incontinence urge and stress    Vitamin D deficiency 05/19/2009   MUSCLE CRAMPS 02/22/2009   ALOPECIA 10/05/2008   Vertigo 07/31/2008   OSTEOARTHRITIS- EARLY T11-12, T12-L1  4/05 01/03/2007    Primary open angle glaucoma (POAG) of both eyes, severe stage 10/03/1999    Myles Gip PT, DPT [phone]  05/12/2021, 1:33 PM  [place] [facility] [address]  [facility] [address] [place], [place], [address] Phone: [phone]   Fax:  [phone]  Name: DESTONI LUNDAY MRN: [id number] Date of Birth: 1951/05/02

## 2021-05-19 ENCOUNTER — Other Ambulatory Visit: Payer: Self-pay

## 2021-05-19 ENCOUNTER — Ambulatory Visit: Payer: Medicare Other | Admitting: Physical Therapy

## 2021-05-19 DIAGNOSIS — M6281 Muscle weakness (generalized): Secondary | ICD-10-CM | POA: Diagnosis not present

## 2021-05-19 DIAGNOSIS — R278 Other lack of coordination: Secondary | ICD-10-CM | POA: Diagnosis not present

## 2021-05-19 NOTE — Therapy (Signed)
[facility]  [facility] [address]. [place], [place], [address] Phone: [phone]   Fax:  [phone]  Physical Therapy Treatment  Patient Details  Name: Kristen Kirk MRN: [id number] Date of Birth: 02-06-1951 Referring Provider (PT): Harl Bowie   Encounter Date: 05/19/2021   PT End of Session - 05/19/21 1211     Visit Number 3    Number of Visits 12    Date for PT Re-Evaluation 07/28/21    Authorization Type IE 05/05/2021    PT Start Time 1207    PT Stop Time 1245    PT Time Calculation (min) 38 min    Activity Tolerance Patient tolerated treatment well    Behavior During Therapy [facility] for tasks assessed/performed             Past Medical History:  Diagnosis Date   Anemia in chronic kidney disease    a. s/p aranesp 11/2013 now starting epo with HD 06/2014   Diabetes mellitus type II    a. Currently diet controlled since losing weight.   DJD (degenerative joint disease)    ESRD (end stage renal disease) ([place])    a. Followed @ [facility];  b. On dialysis since late 2015 (MWF).   ESRD (end stage renal disease) on dialysis [facility])    stage 5 per [facility] renal (Dr. Daphane Shepherd) considering transplant Access: LUE fistula Establishing with [place] [facility]    Essential hypertension    GERD (gastroesophageal reflux disease)    Glaucoma    a. L>R   History of echocardiogram    a. 01/2015 Echo [facility]): EF ?55%, triv MR, mildly dil LA, nl PV.   History of stress test    a. 03/2013 Myoview [facility]): EF 63%, no ischemia.   History of Urge Incontinence    HLD (hyperlipidemia)    Osteoarthritis    Osteoporosis 05/03/2019   DEXA 04/2019 - 1.5 spine, -2.1 hip, -2.5 forearm Check phosphate and PTH to guide further management in h/o kidney transplant   Syncope    a. 12/2015.   Vaginal atrophy    a. vagifem caused spotting, normal TVS   Vitamin D deficiency     Past Surgical History:  Procedure Laterality Date   ANAL RECTAL MANOMETRY N/A 03/16/2021    Procedure: ANO RECTAL MANOMETRY;  Surgeon: Mauri Pole, MD;  Location: [facility];  Service: Endoscopy;  Laterality: N/A;   BLADDER SUSPENSION  2011   for stress incontinence-Dr. MacDiarmid   BREAST BIOPSY Right 12/15/2020   "Q" clip @ 2 o'clock - fat necrosis   BREAST BIOPSY Right 12/15/2020   "X" clip @ 4 o'clock - fat necrosis   CATARACT EXTRACTION Right    CATARACT EXTRACTION Left 02/25/2021   COLONOSCOPY  02/04/2009   normal, rec rpt 10 yrs   COLONOSCOPY  03/2019   diverticulosis, rpt 10 yrs (Nandigam)   KIDNEY TRANSPLANT Right 04/03/2017   [facility])   left eye laser surgery  06/2011   LIGATION OF ARTERIOVENOUS  FISTULA  10/2017   thrombosed L arm AFV   REFRACTIVE SURGERY     TUBAL LIGATION  1990s   bilateral    There were no vitals filed for this visit.   Subjective Assessment - 05/19/21 1209     Subjective Patient presents to clinic 7 min late 2/2 to traffic. Patient notes that she has continued to wear the heel lift without complaint/issue. Patient has been performing seated stretch with good effect.    Currently  in Pain? No/denies              TREATMENT  Pre-treatment assessment:  EXTERNAL PELVIC EXAM:  Patient educated on the purpose of the pelvic exam and articulated understanding; patient consented to the exam verbally. Breath coordination: present Voluntary Contraction: present, strong squeeze and lift palpable, able to coordinate for 5 repetitions, 8 sec hold x4 reps Relaxation: full Perineal movement with sustained IAP increase ("bear down"): descent Perineal movement with rapid IAP increase ("cough"): elevation  Neuromuscular Re-education: Patient education on rectal anatomy, internal and external anal sphincters, basic bowel mechanics, and purpose and structure of a DRE.  Patient educated on strategies for treatment of fecal incontinence including PFM training, rectal biofeedback, and e-stim approaches for improved bowel  control.  Patient educated throughout session on appropriate technique and form using multi-modal cueing, HEP, and activity modification. Patient articulated understanding and returned demonstration.  Patient Response to interventions: Indicates understanding and affirms interest in participating in DRE.  ASSESSMENT Patient presents to clinic with excellent motivation to participate in therapy. Patient demonstrates deficits in PFM strength, PFM coordination, posture, and IAP management. External assessment of PFM indicated adequate fast-twitch muscle fiber coordination and strength with mild/minimal deficits with endurance during today's session and responded positively to educational interventions. Patient will benefit from continued skilled therapeutic intervention to address remaining deficits in PFM strength, PFM coordination, posture, and IAP management in order to increase function and improve overall QOL.     PT Long Term Goals - 05/06/21 1222       PT LONG TERM GOAL #1   Title Patient will demonstrate independence with HEP in order to maximize therapeutic gains and improve carryover from physical therapy sessions to ADLs in the home and community.    Baseline IE: not initiated    Time 12    Period Weeks    Status New    Target Date 07/28/21      PT LONG TERM GOAL #2   Title Patient will demonstrate circumferential and sequential contraction of >3/5 MMT, > 5 sec hold x5 and 5 consecutive quick flicks with </= 10 min rest between testing bouts, and relaxation of the PFM coordinated with breath for improved management of intra-abdominal pressure and normal bowel and bladder function without the presence of pain nor incontinence in order to improve participation at home and in the community.    Baseline IE: not demonstrated    Time 12    Period Weeks    Status New    Target Date 07/29/21      PT LONG TERM GOAL #3   Title Patient will report >3 days without episode of fecal  incontinence/smearing for improved function and participation at home and in the community.    Baseline IE: multiple times/day    Time 12    Period Weeks    Status New    Target Date 07/28/21      PT LONG TERM GOAL #4   Title Patient will articulate and demonstrate osteoporosis specific body mechanics for improved posture and decreased risk of fracture.    Baseline IE: not demonstrated    Time 12    Period Weeks    Status New    Target Date 07/28/21                   Plan - 05/19/21 1212     Clinical Impression Statement Patient presents to clinic with excellent motivation to participate in therapy. Patient demonstrates deficits in PFM  strength, PFM coordination, posture, and IAP management. External assessment of PFM indicated adequate fast-twitch muscle fiber coordination and strength with mild/minimal deficits with endurance during today's session and responded positively to educational interventions. Patient will benefit from continued skilled therapeutic intervention to address remaining deficits in PFM strength, PFM coordination, posture, and IAP management in order to increase function and improve overall QOL.    Personal Factors and Comorbidities Comorbidity 3+;Age;Time since onset of injury/illness/exacerbation    Comorbidities hx of kidney transplant (2018), DM 2, OA, osteoporosis, vaginal atrophy    Examination-Activity Limitations Continence;Sleep;Toileting    Examination-Participation Restrictions Interpersonal Relationship;Cleaning;Laundry    Stability/Clinical Decision Making Evolving/Moderate complexity    Rehab Potential Fair    PT Frequency 1x / week    PT Duration 12 weeks    PT Treatment/Interventions Cryotherapy;Electrical Stimulation;Moist Heat;Therapeutic exercise;Neuromuscular re-education;Patient/family education;Manual techniques;Scar mobilization;Taping    PT Next Visit Plan DRE    PT Home Exercise Plan mermaid stretch    Consulted and Agree with  Plan of Care Patient             Patient will benefit from skilled therapeutic intervention in order to improve the following deficits and impairments:  Postural dysfunction, Improper body mechanics, Decreased strength, Decreased activity tolerance, Decreased coordination, Decreased endurance, Pain, Impaired sensation  Visit Diagnosis: Muscle weakness (generalized)  Other lack of coordination     Problem List Patient Active Problem List   Diagnosis Date Noted   Blurry vision 04/08/2021   Constipation due to outlet dysfunction    Acquired leg length discrepancy 01/28/2021   ASCUS of cervix with negative high risk HPV 10/01/2020   Thoracic spine pain 01/05/2020   Chronic lower back pain 10/08/2019   Osteoporosis 05/03/2019   Advanced care planning/counseling discussion 03/20/2019   Memory impairment 03/20/2019   Right shoulder pain 06/19/2017   Kidney transplant status 04/01/2017   Rectal bleeding 01/11/2017   Incontinence of feces 01/11/2017    Class: Acute   Right sided abdominal pain 12/31/2015   Insomnia 01/14/2013   Xerostomia 01/29/2012   Medicare annual wellness visit, subsequent 07/20/2011   Vaginal atrophy 04/17/2011   Controlled type 2 diabetes mellitus with diabetic nephropathy ([place])    Hyperlipidemia associated with type 2 diabetes mellitus ([place])    GERD (gastroesophageal reflux disease)    HTN (hypertension)    Mixed incontinence urge and stress    Vitamin D deficiency 05/19/2009   MUSCLE CRAMPS 02/22/2009   ALOPECIA 10/05/2008   Vertigo 07/31/2008   OSTEOARTHRITIS- EARLY T11-12, T12-L1  4/05 01/03/2007   Primary open angle glaucoma (POAG) of both eyes, severe stage 10/03/1999    Myles Gip PT, DPT [phone]  05/19/2021, 1:36 PM  [facility] [address] [place], [place], [address] Phone: [phone]   Fax:  [phone]  Name: ARIZBETH PARTHASARATHY MRN: [id number] Date of Birth: 02/20/1951

## 2021-05-26 ENCOUNTER — Other Ambulatory Visit: Payer: Self-pay

## 2021-05-26 ENCOUNTER — Ambulatory Visit: Payer: Medicare Other | Admitting: Physical Therapy

## 2021-05-26 DIAGNOSIS — R278 Other lack of coordination: Secondary | ICD-10-CM | POA: Diagnosis not present

## 2021-05-26 DIAGNOSIS — M6281 Muscle weakness (generalized): Secondary | ICD-10-CM | POA: Diagnosis not present

## 2021-05-26 DIAGNOSIS — Z940 Kidney transplant status: Secondary | ICD-10-CM | POA: Diagnosis not present

## 2021-05-26 NOTE — Therapy (Signed)
[facility] [address] [place], [place], [address] Phone: [phone]   Fax:  [phone]  Physical Therapy Treatment  Patient Details  Name: Kristen Kirk MRN: [id number] Date of Birth: 07-03-1951 Referring Provider (PT): Harl Bowie   Encounter Date: 05/26/2021   PT End of Session - 05/26/21 1520     Visit Number 4    Number of Visits 12    Date for PT Re-Evaluation 07/28/21    Authorization Type IE 05/05/2021    PT Start Time 1200    PT Stop Time 1240    PT Time Calculation (min) 40 min    Activity Tolerance Patient tolerated treatment well    Behavior During Therapy [facility] for tasks assessed/performed             Past Medical History:  Diagnosis Date   Anemia in chronic kidney disease    a. s/p aranesp 11/2013 now starting epo with HD 06/2014   Diabetes mellitus type II    a. Currently diet controlled since losing weight.   DJD (degenerative joint disease)    ESRD (end stage renal disease) ([place])    a. Followed @ [facility];  b. On dialysis since late 2015 (MWF).   ESRD (end stage renal disease) on dialysis [facility])    stage 5 per [facility] renal (Dr. Daphane Shepherd) considering transplant Access: LUE fistula Establishing with [place] [facility]    Essential hypertension    GERD (gastroesophageal reflux disease)    Glaucoma    a. L>R   History of echocardiogram    a. 01/2015 Echo [facility]): EF ?55%, triv MR, mildly dil LA, nl PV.   History of stress test    a. 03/2013 Myoview [facility]): EF 63%, no ischemia.   History of Urge Incontinence    HLD (hyperlipidemia)    Osteoarthritis    Osteoporosis 05/03/2019   DEXA 04/2019 - 1.5 spine, -2.1 hip, -2.5 forearm Check phosphate and PTH to guide further management in h/o kidney transplant   Syncope    a. 12/2015.   Vaginal atrophy    a. vagifem caused spotting, normal TVS   Vitamin D deficiency     Past Surgical History:  Procedure Laterality Date   ANAL RECTAL MANOMETRY N/A 03/16/2021    Procedure: ANO RECTAL MANOMETRY;  Surgeon: Mauri Pole, MD;  Location: [facility];  Service: Endoscopy;  Laterality: N/A;   BLADDER SUSPENSION  2011   for stress incontinence-Dr. MacDiarmid   BREAST BIOPSY Right 12/15/2020   "Q" clip @ 2 o'clock - fat necrosis   BREAST BIOPSY Right 12/15/2020   "X" clip @ 4 o'clock - fat necrosis   CATARACT EXTRACTION Right    CATARACT EXTRACTION Left 02/25/2021   COLONOSCOPY  02/04/2009   normal, rec rpt 10 yrs   COLONOSCOPY  03/2019   diverticulosis, rpt 10 yrs (Nandigam)   KIDNEY TRANSPLANT Right 04/03/2017   [facility])   left eye laser surgery  06/2011   LIGATION OF ARTERIOVENOUS  FISTULA  10/2017   thrombosed L arm AFV   REFRACTIVE SURGERY     TUBAL LIGATION  1990s   bilateral    There were no vitals filed for this visit.   Subjective Assessment - 05/26/21 1517     Subjective Patient states that she is a bit fatigued since she had to get up early for fasting bloodwork. Patient denies any significant changes since last visit, but has continued to work on PFM contractions.  Currently in Pain? No/denies             TREATMENT  Therapeutic Exercise: Patient educated on and performed general strengthening program for improved mobility and decreased risk of falls in the presence of bladder and bowel urgency. Sit to Stand with Counter Support, x10 Seated Long Arc Quad, x10 BLE Heel Toe Raises with Counter Support, x10 Standing Hip Abduction with Counter Support, x10 BLE Push-Up on Counter x10 Plank with Thoracic Rotation on Counter x10 B Plank with Hip Extension on Counter x10 BLE  Neuromuscular Re-education: Patient education on endurance training and progression of PFM strengthening for improved bowel control.  Patient education on use of TENS and NMES protocols for improved FI; amenable to trial next visit.  Patient educated throughout session on appropriate technique and form using multi-modal cueing, HEP, and  activity modification. Patient articulated understanding and returned demonstration.  Patient Response to interventions: Eager to use HEP for general strengthening.  ASSESSMENT Patient presents to clinic with excellent motivation to participate in therapy. Patient demonstrates deficits in PFM strength, PFM coordination, posture, and IAP management. Patient able to perform all general strengthening with acceptable form and understanding of importance of regular exercise for bowel regulation and overall during today's session and responded positively to educational interventions. Patient will benefit from continued skilled therapeutic intervention to address remaining deficits in PFM strength, PFM coordination, posture, and IAP management in order to increase function and improve overall QOL.    PT Long Term Goals - 05/06/21 1222       PT LONG TERM GOAL #1   Title Patient will demonstrate independence with HEP in order to maximize therapeutic gains and improve carryover from physical therapy sessions to ADLs in the home and community.    Baseline IE: not initiated    Time 12    Period Weeks    Status New    Target Date 07/28/21      PT LONG TERM GOAL #2   Title Patient will demonstrate circumferential and sequential contraction of >3/5 MMT, > 5 sec hold x5 and 5 consecutive quick flicks with </= 10 min rest between testing bouts, and relaxation of the PFM coordinated with breath for improved management of intra-abdominal pressure and normal bowel and bladder function without the presence of pain nor incontinence in order to improve participation at home and in the community.    Baseline IE: not demonstrated    Time 12    Period Weeks    Status New    Target Date 07/29/21      PT LONG TERM GOAL #3   Title Patient will report >3 days without episode of fecal incontinence/smearing for improved function and participation at home and in the community.    Baseline IE: multiple times/day     Time 12    Period Weeks    Status New    Target Date 07/28/21      PT LONG TERM GOAL #4   Title Patient will articulate and demonstrate osteoporosis specific body mechanics for improved posture and decreased risk of fracture.    Baseline IE: not demonstrated    Time 12    Period Weeks    Status New    Target Date 07/28/21                   Plan - 05/26/21 1521     Clinical Impression Statement Patient presents to clinic with excellent motivation to participate in therapy. Patient demonstrates deficits in PFM  strength, PFM coordination, posture, and IAP management. Patient able to perform all general strengthening with acceptable form and understanding of importance of regular exercise for bowel regulation and overall during today's session and responded positively to educational interventions. Patient will benefit from continued skilled therapeutic intervention to address remaining deficits in PFM strength, PFM coordination, posture, and IAP management in order to increase function and improve overall QOL.    Personal Factors and Comorbidities Comorbidity 3+;Age;Time since onset of injury/illness/exacerbation    Comorbidities hx of kidney transplant (2018), DM 2, OA, osteoporosis, vaginal atrophy    Examination-Activity Limitations Continence;Sleep;Toileting    Examination-Participation Restrictions Interpersonal Relationship;Cleaning;Laundry    Stability/Clinical Decision Making Evolving/Moderate complexity    Rehab Potential Fair    PT Frequency 1x / week    PT Duration 12 weeks    PT Treatment/Interventions Cryotherapy;Electrical Stimulation;Moist Heat;Therapeutic exercise;Neuromuscular re-education;Patient/family education;Manual techniques;Scar mobilization;Taping    PT Next Visit Plan TENS for FI    PT Home Exercise Plan mermaid stretch    Consulted and Agree with Plan of Care Patient             Patient will benefit from skilled therapeutic intervention in order  to improve the following deficits and impairments:  Postural dysfunction, Improper body mechanics, Decreased strength, Decreased activity tolerance, Decreased coordination, Decreased endurance, Pain, Impaired sensation  Visit Diagnosis: Muscle weakness (generalized)  Other lack of coordination     Problem List Patient Active Problem List   Diagnosis Date Noted   Blurry vision 04/08/2021   Constipation due to outlet dysfunction    Acquired leg length discrepancy 01/28/2021   ASCUS of cervix with negative high risk HPV 10/01/2020   Thoracic spine pain 01/05/2020   Chronic lower back pain 10/08/2019   Osteoporosis 05/03/2019   Advanced care planning/counseling discussion 03/20/2019   Memory impairment 03/20/2019   Right shoulder pain 06/19/2017   Kidney transplant status 04/01/2017   Rectal bleeding 01/11/2017   Incontinence of feces 01/11/2017    Class: Acute   Right sided abdominal pain 12/31/2015   Insomnia 01/14/2013   Xerostomia 01/29/2012   Medicare annual wellness visit, subsequent 07/20/2011   Vaginal atrophy 04/17/2011   Controlled type 2 diabetes mellitus with diabetic nephropathy ([place])    Hyperlipidemia associated with type 2 diabetes mellitus ([place])    GERD (gastroesophageal reflux disease)    HTN (hypertension)    Mixed incontinence urge and stress    Vitamin D deficiency 05/19/2009   MUSCLE CRAMPS 02/22/2009   ALOPECIA 10/05/2008   Vertigo 07/31/2008   OSTEOARTHRITIS- EARLY T11-12, T12-L1  4/05 01/03/2007   Primary open angle glaucoma (POAG) of both eyes, severe stage 10/03/1999    Myles Gip PT, DPT [id number]  05/26/2021, 4:06 PM  [facility] [address]. [place], [place], [address] Phone: [phone]   Fax:  [phone]  Name: Kristen Kirk MRN: [id number] Date of Birth: 21-Apr-1951

## 2021-06-02 ENCOUNTER — Other Ambulatory Visit: Payer: Self-pay

## 2021-06-02 ENCOUNTER — Ambulatory Visit: Payer: Medicare Other | Attending: Gastroenterology | Admitting: Physical Therapy

## 2021-06-02 DIAGNOSIS — M6281 Muscle weakness (generalized): Secondary | ICD-10-CM | POA: Diagnosis not present

## 2021-06-02 DIAGNOSIS — R278 Other lack of coordination: Secondary | ICD-10-CM | POA: Diagnosis not present

## 2021-06-03 ENCOUNTER — Encounter: Payer: Self-pay | Admitting: Physical Therapy

## 2021-06-03 NOTE — Therapy (Signed)
[facility] [address] [place], [place], [address] Phone: [phone]   Fax:  [phone]  Physical Therapy Treatment  Patient Details  Name: Kristen Kirk MRN: [id number] Date of Birth: 09/07/1951 Referring Provider (PT): Harl Bowie   Encounter Date: 06/02/2021   PT End of Session - 06/03/21 1211     Visit Number 5    Number of Visits 12    Date for PT Re-Evaluation 07/28/21    Authorization Type IE 05/05/2021    PT Start Time 1200    PT Stop Time 1240    PT Time Calculation (min) 40 min    Activity Tolerance Patient tolerated treatment well    Behavior During Therapy [facility] for tasks assessed/performed             Past Medical History:  Diagnosis Date   Anemia in chronic kidney disease    a. s/p aranesp 11/2013 now starting epo with HD 06/2014   Diabetes mellitus type II    a. Currently diet controlled since losing weight.   DJD (degenerative joint disease)    ESRD (end stage renal disease) ([place])    a. Followed @ [facility];  b. On dialysis since late 2015 (MWF).   ESRD (end stage renal disease) on dialysis [facility])    stage 5 per [facility] renal (Dr. Daphane Shepherd) considering transplant Access: LUE fistula Establishing with [facility]    Essential hypertension    GERD (gastroesophageal reflux disease)    Glaucoma    a. L>R   History of echocardiogram    a. 01/2015 Echo [facility]): EF ?55%, triv MR, mildly dil LA, nl PV.   History of stress test    a. 03/2013 Myoview [facility]): EF 63%, no ischemia.   History of Urge Incontinence    HLD (hyperlipidemia)    Osteoarthritis    Osteoporosis 05/03/2019   DEXA 04/2019 - 1.5 spine, -2.1 hip, -2.5 forearm Check phosphate and PTH to guide further management in h/o kidney transplant   Syncope    a. 12/2015.   Vaginal atrophy    a. vagifem caused spotting, normal TVS   Vitamin D deficiency     Past Surgical History:  Procedure Laterality Date   ANAL RECTAL MANOMETRY N/A 03/16/2021    Procedure: ANO RECTAL MANOMETRY;  Surgeon: Mauri Pole, MD;  Location: [facility];  Service: Endoscopy;  Laterality: N/A;   BLADDER SUSPENSION  2011   for stress incontinence-Dr. MacDiarmid   BREAST BIOPSY Right 12/15/2020   "Q" clip @ 2 o'clock - fat necrosis   BREAST BIOPSY Right 12/15/2020   "X" clip @ 4 o'clock - fat necrosis   CATARACT EXTRACTION Right    CATARACT EXTRACTION Left 02/25/2021   COLONOSCOPY  02/04/2009   normal, rec rpt 10 yrs   COLONOSCOPY  03/2019   diverticulosis, rpt 10 yrs (Nandigam)   KIDNEY TRANSPLANT Right 04/03/2017   [facility])   left eye laser surgery  06/2011   LIGATION OF ARTERIOVENOUS  FISTULA  10/2017   thrombosed L arm AFV   REFRACTIVE SURGERY     TUBAL LIGATION  1990s   bilateral    There were no vitals filed for this visit.   Subjective Assessment - 06/03/21 1208     Subjective Patient report that she is doing well and is ready to learn to use the home TENS unit for treatment of FI. Patient denies any significant changes or concerns. Patient has continued to work on [employer]  for PFM and general strengthening.    Currently in Pain? No/denies             TREATMENT  Neuromuscular Re-education: Supine hooklying diaphragmatic breathing with VCs and TCs for downregulation of the nervous system and improved management of IAP Supine hooklying, PFM contractions with exhalation. VCs and TCs to decrease compensatory patterns and encourage activation of the PFM. Patient education on use of home TENS unit, electrode placement, signs of skin irritation, and indications for use. Patient able to demonstrate appropriate management and set up of TENS unit. Provided with handout with visual and written instructions.  Treatments unbilled: TENS continuous, posterior tibial nerve, 30.71m, 150 bps for pain relief. No skin irritation or signs of burn noted after removal of pads. 20 minutes during neuromuscular re-education.   Patient educated  throughout session on appropriate technique and form using multi-modal cueing, HEP, and activity modification. Patient articulated understanding and returned demonstration.  Patient Response to interventions: Articulates understanding and comfortable to work with TENS unit at home 2x/week.   ASSESSMENT Patient presents to clinic with excellent motivation to participate in therapy. Patient demonstrates deficits in PFM strength, PFM coordination, posture, and IAP management. Patient tolerated initial TENS treatment of posterior tibial nerve during today's session and responded positively to educational interventions. Patient will benefit from continued skilled therapeutic intervention to address remaining deficits in PFM strength, PFM coordination, posture, and IAP management in order to increase function and improve overall QOL.    PT Long Term Goals - 05/06/21 1222       PT LONG TERM GOAL #1   Title Patient will demonstrate independence with HEP in order to maximize therapeutic gains and improve carryover from physical therapy sessions to ADLs in the home and community.    Baseline IE: not initiated    Time 12    Period Weeks    Status New    Target Date 07/28/21      PT LONG TERM GOAL #2   Title Patient will demonstrate circumferential and sequential contraction of >3/5 MMT, > 5 sec hold x5 and 5 consecutive quick flicks with </= 10 min rest between testing bouts, and relaxation of the PFM coordinated with breath for improved management of intra-abdominal pressure and normal bowel and bladder function without the presence of pain nor incontinence in order to improve participation at home and in the community.    Baseline IE: not demonstrated    Time 12    Period Weeks    Status New    Target Date 07/29/21      PT LONG TERM GOAL #3   Title Patient will report >3 days without episode of fecal incontinence/smearing for improved function and participation at home and in the community.     Baseline IE: multiple times/day    Time 12    Period Weeks    Status New    Target Date 07/28/21      PT LONG TERM GOAL #4   Title Patient will articulate and demonstrate osteoporosis specific body mechanics for improved posture and decreased risk of fracture.    Baseline IE: not demonstrated    Time 12    Period Weeks    Status New    Target Date 07/28/21                   Plan - 06/03/21 1212     Clinical Impression Statement Patient presents to clinic with excellent motivation to participate in therapy. Patient demonstrates deficits  in PFM strength, PFM coordination, posture, and IAP management. Patient tolerated initial TENS treatment of posterior tibial nerve during today's session and responded positively to educational interventions. Patient will benefit from continued skilled therapeutic intervention to address remaining deficits in PFM strength, PFM coordination, posture, and IAP management in order to increase function and improve overall QOL.    Personal Factors and Comorbidities Comorbidity 3+;Age;Time since onset of injury/illness/exacerbation    Comorbidities hx of kidney transplant (2018), DM 2, OA, osteoporosis, vaginal atrophy    Examination-Activity Limitations Continence;Sleep;Toileting    Examination-Participation Restrictions Interpersonal Relationship;Cleaning;Laundry    Stability/Clinical Decision Making Evolving/Moderate complexity    Rehab Potential Fair    PT Frequency 1x / week    PT Duration 12 weeks    PT Treatment/Interventions Cryotherapy;Electrical Stimulation;Moist Heat;Therapeutic exercise;Neuromuscular re-education;Patient/family education;Manual techniques;Scar mobilization;Taping    PT Next Visit Plan reassessment    PT Home Exercise Plan mermaid stretch    Consulted and Agree with Plan of Care Patient             Patient will benefit from skilled therapeutic intervention in order to improve the following deficits and impairments:   Postural dysfunction, Improper body mechanics, Decreased strength, Decreased activity tolerance, Decreased coordination, Decreased endurance, Pain, Impaired sensation  Visit Diagnosis: Muscle weakness (generalized)  Other lack of coordination     Problem List Patient Active Problem List   Diagnosis Date Noted   Blurry vision 04/08/2021   Constipation due to outlet dysfunction    Acquired leg length discrepancy 01/28/2021   ASCUS of cervix with negative high risk HPV 10/01/2020   Thoracic spine pain 01/05/2020   Chronic lower back pain 10/08/2019   Osteoporosis 05/03/2019   Advanced care planning/counseling discussion 03/20/2019   Memory impairment 03/20/2019   Right shoulder pain 06/19/2017   Kidney transplant status 04/01/2017   Rectal bleeding 01/11/2017   Incontinence of feces 01/11/2017    Class: Acute   Right sided abdominal pain 12/31/2015   Insomnia 01/14/2013   Xerostomia 01/29/2012   Medicare annual wellness visit, subsequent 07/20/2011   Vaginal atrophy 04/17/2011   Controlled type 2 diabetes mellitus with diabetic nephropathy ([place])    Hyperlipidemia associated with type 2 diabetes mellitus ([place])    GERD (gastroesophageal reflux disease)    HTN (hypertension)    Mixed incontinence urge and stress    Vitamin D deficiency 05/19/2009   MUSCLE CRAMPS 02/22/2009   ALOPECIA 10/05/2008   Vertigo 07/31/2008   OSTEOARTHRITIS- EARLY T11-12, T12-L1  4/05 01/03/2007   Primary open angle glaucoma (POAG) of both eyes, severe stage 10/03/1999    Myles Gip PT, DPT [phone]  06/03/2021, 12:43 PM  [facility] [address] [place], [place], [address] Phone: [phone]   Fax:  [phone]  Name: SCHERRI MESSERSMITH MRN: [id number] Date of Birth: 01-Dec-1950

## 2021-06-09 ENCOUNTER — Encounter: Payer: Medicare Other | Admitting: Physical Therapy

## 2021-06-13 DIAGNOSIS — H16143 Punctate keratitis, bilateral: Secondary | ICD-10-CM | POA: Diagnosis not present

## 2021-06-13 DIAGNOSIS — H401133 Primary open-angle glaucoma, bilateral, severe stage: Secondary | ICD-10-CM | POA: Diagnosis not present

## 2021-06-13 DIAGNOSIS — H02401 Unspecified ptosis of right eyelid: Secondary | ICD-10-CM | POA: Diagnosis not present

## 2021-06-13 DIAGNOSIS — H18413 Arcus senilis, bilateral: Secondary | ICD-10-CM | POA: Diagnosis not present

## 2021-06-15 DIAGNOSIS — S46811A Strain of other muscles, fascia and tendons at shoulder and upper arm level, right arm, initial encounter: Secondary | ICD-10-CM | POA: Diagnosis not present

## 2021-06-15 DIAGNOSIS — M7581 Other shoulder lesions, right shoulder: Secondary | ICD-10-CM | POA: Diagnosis not present

## 2021-06-23 ENCOUNTER — Other Ambulatory Visit: Payer: Self-pay | Admitting: Family Medicine

## 2021-06-27 DIAGNOSIS — Z940 Kidney transplant status: Secondary | ICD-10-CM | POA: Diagnosis not present

## 2021-07-13 DIAGNOSIS — Z940 Kidney transplant status: Secondary | ICD-10-CM | POA: Diagnosis not present

## 2021-07-19 DIAGNOSIS — H6063 Unspecified chronic otitis externa, bilateral: Secondary | ICD-10-CM | POA: Diagnosis not present

## 2021-07-19 DIAGNOSIS — H8101 Meniere's disease, right ear: Secondary | ICD-10-CM | POA: Diagnosis not present

## 2021-07-21 ENCOUNTER — Ambulatory Visit: Payer: Medicare Other | Admitting: Physical Therapy

## 2021-07-28 DIAGNOSIS — Z4822 Encounter for aftercare following kidney transplant: Secondary | ICD-10-CM | POA: Diagnosis not present

## 2021-07-28 DIAGNOSIS — R93422 Abnormal radiologic findings on diagnostic imaging of left kidney: Secondary | ICD-10-CM | POA: Diagnosis not present

## 2021-07-28 DIAGNOSIS — Z940 Kidney transplant status: Secondary | ICD-10-CM | POA: Diagnosis not present

## 2021-07-28 DIAGNOSIS — E1122 Type 2 diabetes mellitus with diabetic chronic kidney disease: Secondary | ICD-10-CM | POA: Diagnosis not present

## 2021-07-28 DIAGNOSIS — Z79899 Other long term (current) drug therapy: Secondary | ICD-10-CM | POA: Diagnosis not present

## 2021-07-28 DIAGNOSIS — N186 End stage renal disease: Secondary | ICD-10-CM | POA: Diagnosis not present

## 2021-07-28 DIAGNOSIS — N271 Small kidney, bilateral: Secondary | ICD-10-CM | POA: Diagnosis not present

## 2021-07-28 DIAGNOSIS — Z7982 Long term (current) use of aspirin: Secondary | ICD-10-CM | POA: Diagnosis not present

## 2021-07-28 DIAGNOSIS — I10 Essential (primary) hypertension: Secondary | ICD-10-CM | POA: Diagnosis not present

## 2021-07-28 DIAGNOSIS — D631 Anemia in chronic kidney disease: Secondary | ICD-10-CM | POA: Diagnosis not present

## 2021-07-28 DIAGNOSIS — I120 Hypertensive chronic kidney disease with stage 5 chronic kidney disease or end stage renal disease: Secondary | ICD-10-CM | POA: Diagnosis not present

## 2021-07-28 DIAGNOSIS — Z6825 Body mass index (BMI) 25.0-25.9, adult: Secondary | ICD-10-CM | POA: Diagnosis not present

## 2021-07-28 DIAGNOSIS — Z7983 Long term (current) use of bisphosphonates: Secondary | ICD-10-CM | POA: Diagnosis not present

## 2021-07-28 DIAGNOSIS — R93421 Abnormal radiologic findings on diagnostic imaging of right kidney: Secondary | ICD-10-CM | POA: Diagnosis not present

## 2021-08-05 ENCOUNTER — Encounter: Payer: Self-pay | Admitting: Physical Therapy

## 2021-08-05 ENCOUNTER — Other Ambulatory Visit: Payer: Self-pay

## 2021-08-05 ENCOUNTER — Ambulatory Visit: Payer: Medicare Other | Attending: Gastroenterology | Admitting: Physical Therapy

## 2021-08-05 DIAGNOSIS — R278 Other lack of coordination: Secondary | ICD-10-CM | POA: Insufficient documentation

## 2021-08-05 DIAGNOSIS — M6281 Muscle weakness (generalized): Secondary | ICD-10-CM | POA: Diagnosis not present

## 2021-08-05 NOTE — Therapy (Signed)
[facility] [facility] [address] [place], [place], [address] Phone: [phone]   Fax:  [phone]  Physical Therapy Treatment  Patient Details  Name: Kristen Kirk MRN: [id number] Date of Birth: July 24, 1951 Referring Provider (PT): Harl Bowie   Encounter Date: 08/05/2021   PT End of Session - 08/05/21 1013     Visit Number 6    Number of Visits 12    Date for PT Re-Evaluation 08/05/21    Authorization Type IE 05/05/2021    PT Start Time 1010    PT Stop Time 1050    PT Time Calculation (min) 40 min    Activity Tolerance Patient tolerated treatment well    Behavior During Therapy [facility] for tasks assessed/performed             Past Medical History:  Diagnosis Date   Anemia in chronic kidney disease    a. s/p aranesp 11/2013 now starting epo with HD 06/2014   Diabetes mellitus type II    a. Currently diet controlled since losing weight.   DJD (degenerative joint disease)    ESRD (end stage renal disease) ([place])    a. Followed @ [facility];  b. On dialysis since late 2015 (MWF).   ESRD (end stage renal disease) on dialysis [facility])    stage 5 per [facility] renal (Dr. Daphane Shepherd) considering transplant Access: LUE fistula Establishing with [place] [facility]    Essential hypertension    GERD (gastroesophageal reflux disease)    Glaucoma    a. L>R   History of echocardiogram    a. 01/2015 Echo [facility]): EF ?55%, triv MR, mildly dil LA, nl PV.   History of stress test    a. 03/2013 Myoview [facility]): EF 63%, no ischemia.   History of Urge Incontinence    HLD (hyperlipidemia)    Osteoarthritis    Osteoporosis 05/03/2019   DEXA 04/2019 - 1.5 spine, -2.1 hip, -2.5 forearm Check phosphate and PTH to guide further management in h/o kidney transplant   Syncope    a. 12/2015.   Vaginal atrophy    a. vagifem caused spotting, normal TVS   Vitamin D deficiency     Past Surgical History:  Procedure Laterality Date   ANAL RECTAL MANOMETRY N/A 03/16/2021    Procedure: ANO RECTAL MANOMETRY;  Surgeon: Mauri Pole, MD;  [facility];  Service: Endoscopy;  Laterality: N/A;   BLADDER SUSPENSION  2011   for stress incontinence-Dr. MacDiarmid   BREAST BIOPSY Right 12/15/2020   "Q" clip @ 2 o'clock - fat necrosis   BREAST BIOPSY Right 12/15/2020   "X" clip @ 4 o'clock - fat necrosis   CATARACT EXTRACTION Right    CATARACT EXTRACTION Left 02/25/2021   COLONOSCOPY  02/04/2009   normal, rec rpt 10 yrs   COLONOSCOPY  03/2019   diverticulosis, rpt 10 yrs (Nandigam)   KIDNEY TRANSPLANT Right 04/03/2017   [facility])   left eye laser surgery  06/2011   LIGATION OF ARTERIOVENOUS  FISTULA  10/2017   thrombosed L arm AFV   REFRACTIVE SURGERY     TUBAL LIGATION  1990s   bilateral    There were no vitals filed for this visit.   Subjective Assessment - 08/05/21 1011     Subjective Patient notes that she used home TENS unit for 2x/week over the course fo 6-7 weeks. Patient denies any adverse effect, but did not notice any change in symptoms.    Currently in Pain? No/denies  TREATMENT  Neuromuscular Re-education: Reassessed goals; see below. Discussed next steps regarding management of FI and importance of following up with gastroenterology.  Patient education on continued participation in HEP including general strengthening as well as specific PFM strengthening to maintain external sphincter function both anteriorly and posteriorly.    Patient educated throughout session on appropriate technique and form using multi-modal cueing, HEP, and activity modification. Patient articulated understanding and returned demonstration.  Patient Response to interventions: Comfortable to return to MD for further guidance.  ASSESSMENT Patient presents to clinic with excellent motivation to participate in therapy. Patient demonstrates minimal/negligible deficits in PFM strength, PFM coordination, posture, and IAP management.  Patient has achieved adequate external PFM strength and understanding of improved body mechanics; however is still distressed by persistent FI, albeit at a decreased frequency. Patient participated in six week HEP of posterior tibial nerve stimulation for 2x/week at 30 minute duration. Prior to PTNS, patient had FI "multiple" times/day and up to noting FI with every trip to toilet. After PTNS, patient notes she has FI 1-2/day, but also indicates continued frustration. At this time, patient has likely reached maximum potential from a physical therapy perspective and is appropriate to discharge back MD for further guidance on management/intervention for treatment of FI.      PT Long Term Goals - 08/05/21 1014       PT LONG TERM GOAL #1   Title Patient will demonstrate independence with HEP in order to maximize therapeutic gains and improve carryover from physical therapy sessions to ADLs in the home and community.    Baseline IE: not initiated; 11/4: IND    Time 12    Period Weeks    Status Achieved      PT LONG TERM GOAL #2   Title Patient will demonstrate circumferential and sequential contraction of >3/5 MMT, > 5 sec hold x5 and 5 consecutive quick flicks with </= 10 min rest between testing bouts, and relaxation of the PFM coordinated with breath for improved management of intra-abdominal pressure and normal bowel and bladder function without the presence of pain nor incontinence in order to improve participation at home and in the community.    Baseline IE: not demonstrated; 11/4: 4-5/5 MMT, 8 sec hold x6, 5 consecutive quick flicks    Time 12    Period Weeks    Status Achieved      PT LONG TERM GOAL #3   Title Patient will report >3 days without episode of fecal incontinence/smearing for improved function and participation at home and in the community.    Baseline IE: multiple times/day; 11/4: 1-2x/day    Time 12    Period Weeks    Status Not Met      PT LONG TERM GOAL #4   Title  Patient will articulate and demonstrate osteoporosis specific body mechanics for improved posture and decreased risk of fracture.    Baseline IE: not demonstrated; 11/4: IND    Time 12    Period Weeks    Status Achieved                   Plan - 08/05/21 1014     Personal Factors and Comorbidities Comorbidity 3+;Age;Time since onset of injury/illness/exacerbation    Comorbidities hx of kidney transplant (2018), DM 2, OA, osteoporosis, vaginal atrophy    Examination-Activity Limitations Continence;Sleep;Toileting    Examination-Participation Restrictions Interpersonal Relationship;Cleaning;Laundry    Stability/Clinical Decision Making Evolving/Moderate complexity    Rehab Potential Fair    PT  Frequency 1x / week    PT Duration 12 weeks    PT Treatment/Interventions Cryotherapy;Electrical Stimulation;Moist Heat;Therapeutic exercise;Neuromuscular re-education;Patient/family education;Manual techniques;Scar mobilization;Taping    PT Next Visit Plan reassessment    PT Home Exercise Plan mermaid stretch    Consulted and Agree with Plan of Care Patient             Patient will benefit from skilled therapeutic intervention in order to improve the following deficits and impairments:  Postural dysfunction, Improper body mechanics, Decreased strength, Decreased activity tolerance, Decreased coordination, Decreased endurance, Pain, Impaired sensation  Visit Diagnosis: Muscle weakness (generalized)  Other lack of coordination     Problem List Patient Active Problem List   Diagnosis Date Noted   Blurry vision 04/08/2021   Constipation due to outlet dysfunction    Acquired leg length discrepancy 01/28/2021   ASCUS of cervix with negative high risk HPV 10/01/2020   Thoracic spine pain 01/05/2020   Chronic lower back pain 10/08/2019   Osteoporosis 05/03/2019   Advanced care planning/counseling discussion 03/20/2019   Memory impairment 03/20/2019   Right shoulder pain  06/19/2017   Kidney transplant status 04/01/2017   Rectal bleeding 01/11/2017   Incontinence of feces 01/11/2017    Class: Acute   Right sided abdominal pain 12/31/2015   Insomnia 01/14/2013   Xerostomia 01/29/2012   Medicare annual wellness visit, subsequent 07/20/2011   Vaginal atrophy 04/17/2011   Controlled type 2 diabetes mellitus with diabetic nephropathy ([place])    Hyperlipidemia associated with type 2 diabetes mellitus ([place])    GERD (gastroesophageal reflux disease)    HTN (hypertension)    Mixed incontinence urge and stress    Vitamin D deficiency 05/19/2009   MUSCLE CRAMPS 02/22/2009   ALOPECIA 10/05/2008   Vertigo 07/31/2008   OSTEOARTHRITIS- EARLY T11-12, T12-L1  4/05 01/03/2007   Primary open angle glaucoma (POAG) of both eyes, severe stage 10/03/1999   Myles Gip PT, DPT [phone]  08/05/2021, 11:01 AM  [facility] [address] [place], [place], [address] Phone: [phone]   Fax:  [phone]  Name: Kristen Kirk MRN: [id number] Date of Birth: 1951/01/11

## 2021-09-12 DIAGNOSIS — Z940 Kidney transplant status: Secondary | ICD-10-CM | POA: Diagnosis not present

## 2021-09-12 DIAGNOSIS — E119 Type 2 diabetes mellitus without complications: Secondary | ICD-10-CM | POA: Diagnosis not present

## 2021-09-12 DIAGNOSIS — H2512 Age-related nuclear cataract, left eye: Secondary | ICD-10-CM | POA: Diagnosis not present

## 2021-09-12 DIAGNOSIS — H18413 Arcus senilis, bilateral: Secondary | ICD-10-CM | POA: Diagnosis not present

## 2021-09-12 DIAGNOSIS — H401133 Primary open-angle glaucoma, bilateral, severe stage: Secondary | ICD-10-CM | POA: Diagnosis not present

## 2021-09-13 ENCOUNTER — Encounter: Payer: Self-pay | Admitting: Gastroenterology

## 2021-09-13 ENCOUNTER — Ambulatory Visit: Payer: Medicare Other | Admitting: Gastroenterology

## 2021-09-13 VITALS — BP 122/60 | HR 66 | Ht 62.0 in | Wt 142.4 lb

## 2021-09-13 DIAGNOSIS — R159 Full incontinence of feces: Secondary | ICD-10-CM | POA: Diagnosis not present

## 2021-09-13 NOTE — Patient Instructions (Signed)
Take Benefiber 1 tablespoon twice a day  We will refer you to Dr Marcello Moores at [facility]  Lactose-Free Diet, Adult If you have lactose intolerance, you are not able to digest lactose. Lactose is a natural sugar found mainly in dairy milk and dairy products. A lactose-free diet can help you avoid foods and beverages that contain lactose. What are tips for following this plan? Reading food labels Do not consume foods, beverages, vitamins, minerals, or medicines containing lactose. Read ingredient lists carefully. Look for the words "lactose-free" on labels. Meal planning Use alternatives to dairy milk and foods made with milk products. These include the following: Lactose-free milk. Soy milk with added calcium and vitamin D. Almond milk, coconut milk, rice milk, or other nondairy milk alternatives with added calcium and vitamin D. Note that a lot of these are low in protein. Soy products, such as soy yogurt, soy cheese, soy ice cream, and soy-based sour cream. Other nut milk products, such as almond yogurt, almond cheese, cashew yogurt, cashew cheese, cashew ice cream, coconut yogurt, and coconut ice cream. Medicines, vitamins, and supplements Use lactase enzyme drops or tablets as directed by your health care provider. Make sure you get enough calcium and vitamin D in your diet. A lactose-free eating plan can be lacking in these important nutrients. Take calcium and vitamin D supplements as directed by your health care provider. Talk with your health care provider about supplements if you are not able to get enough calcium and vitamin D from food. What foods should I eat? Fruits All fresh, canned, frozen, or dried fruits and fruit juices that are not processed with lactose. Vegetables All fresh, frozen, and canned vegetables without cheese, cream, or butter sauces. Grains Any that are not made with dairy milk or dairy products. Meats and other proteins Any meat, fish, poultry, and other protein  sources that are not made with dairy milk or dairy products. Fats and oils Any that are not made with dairy milk or dairy products. Sweets and desserts Any that are not made with dairy milk or dairy products. Seasonings and condiments Any that are not made with dairy milk or dairy products. Calcium Calcium is found in many foods that contain lactose and is important for bone health. The amount of calcium you need depends on your age: Adults younger than 50 years: 1,000 mg of calcium a day. Adults older than 50 years: 1,200 mg of calcium a day. If you are not getting enough calcium, you may get it from other sources, including: Orange juice that has been fortified with calcium. This means that calcium has been added to the product. There are 300-350 mg of calcium in 1 cup (237 mL) of calcium-fortified orange juice. Soy milk fortified with calcium. There are 300-400 mg of calcium in 1 cup (237 mL) of calcium-fortified soy milk. Rice or almond milk fortified with calcium. There are 300 mg of calcium in 1 cup (237 mL) of calcium-fortified rice or almond milk. Breakfast cereals fortified with calcium. There are 100-1,000 mg of calcium in calcium-fortified breakfast cereals. Spinach, cooked. There are 145 mg of calcium in  cup (90 g) of cooked spinach. Edamame, cooked. There are 130 mg of calcium in  cup (47 g) of cooked edamame. Collard greens, cooked. There are 125 mg of calcium in  cup (85 g) of cooked collard greens. Kale, frozen or cooked. There are 90 mg of calcium in  cup (59 g) of cooked or frozen kale. Almonds. There are 95 mg  of calcium in  cup (35 g) of almonds. Broccoli, cooked. There are 60 mg of calcium in 1 cup (156 g) of cooked broccoli. The items listed above may not be a complete list of foods and beverages you can eat. Contact a dietitian for more options. What foods should I avoid? Lactose is found in dairy milk and dairy products, such  as: Yogurt. Cheese. Butter. Margarine. Sour cream. Cream. Whipped toppings and creamers. Ice cream and other dairy-based desserts. Lactose is also found in foods or products made with dairy milk or milk ingredients. To find out whether a food contains dairy milk or a milk ingredient, look at the ingredients list. Avoid foods with the statement "May contain milk" and foods that contain: Milk powder. Whey. Curd. Lactose. Lactoglobulin. The items listed above may not be a complete list of foods and beverages to avoid. Contact a dietitian for more information. Where to find more information [employer] of Diabetes and Digestive and Kidney Diseases: [email or web address] Summary If you are lactose intolerant, it means that you are not able to digest lactose, a natural sugar found in milk and milk products. Following a lactose-free diet can help you manage this condition. Calcium is important for bone health and is found in many foods that contain lactose. Talk with your health care provider about other sources of calcium. This information is not intended to replace advice given to you by your health care provider. Make sure you discuss any questions you have with your health care provider. Document Revised: 08/24/2020 Document Reviewed: 08/24/2020 Elsevier Patient Education  2022 [employer].  If you are age 4 or older, your body mass index should be between 23-30. Your Body mass index is 26.04 kg/m. If this is out of the aforementioned range listed, please consider follow up with your Primary Care Provider.  If you are age 43 or younger, your body mass index should be between 19-25. Your Body mass index is 26.04 kg/m. If this is out of the aformentioned range listed, please consider follow up with your Primary Care Provider.   ________________________________________________________  The [facility] providers would like to encourage you to use [facility] to communicate with providers  for non-urgent requests or questions.  Due to long hold times on the telephone, sending your provider a message by [facility] may be a faster and more efficient way to get a response.  Please allow 48 business hours for a response.  Please remember that this is for non-urgent requests.  _______________________________________________________   I appreciate the  opportunity to care for you  Thank You   Harl Bowie , MD

## 2021-09-13 NOTE — Progress Notes (Signed)
Kristen Kirk    [id number]    [birth year]  Primary Care Physician:Gutierrez, Garlon Hatchet, MD  Referring Physician: Ria Bush, MD [address] [place],  [place] [address]   Chief complaint:  Fecal incontinence  HPI: 70 year old very pleasant female here for follow-up visit with complaints of persistent fecal incontinence Frequent episodes of fecal urgency and incontinence. She was referred to pelvic floor physical therapy but has not noticed any significant improvement with physical therapy and biofeedback  Denies any watery diarrhea, melena or rectal bleeding She is status postrenal transplant in 2018  Colonoscopy 03/25/19: - Diverticulosis in the sigmoid colon and in the descending colon. - Non-bleeding internal hemorrhoids.  Anorectal manometry 6/15/222 Weak internal anal sphincter and evidence of dyssynergic defecation  She has a bowel movement once or twice a day She is using daily Miralax, she feels its keeping it moving   Outpatient Encounter Medications as of 09/13/2021  Medication Sig   acetaminophen-codeine (TYLENOL #3) 300-30 MG tablet Take 0.5-1 tablets by mouth 2 (two) times daily as needed for moderate pain.   alendronate (FOSAMAX) 70 MG tablet Take 70 mg by mouth once a week. Take with a full glass of water on an empty stomach.   apraclonidine (IOPIDINE) 0.5 % ophthalmic solution Place 1 drop into the left eye 2 (two) times daily.   aspirin 81 MG tablet Take 81 mg by mouth daily.   Blood Glucose Monitoring Suppl (ONE TOUCH ULTRA SYSTEM KIT) W/DEVICE KIT 1 kit by Does not apply route once.   carvedilol (COREG) 25 MG tablet Take 1.5 tablets (37.5 mg total) by mouth 2 (two) times daily with a meal.   Cholecalciferol (VITAMIN D3) 1000 units CAPS Take 2 capsules (2,000 Units total) by mouth daily.   docusate sodium (COLACE) 100 MG capsule Take 100 mg by mouth 2 (two) times daily as needed.    dorzolamide-timolol (COSOPT) 22.3-6.8 MG/ML  ophthalmic solution Place 1 drop into both eyes 3 (three) times daily.   famotidine (PEPCID) 20 MG tablet Take 1 tablet (20 mg total) by mouth 2 (two) times daily.   glucose blood test strip Check sugars once daily   hydrochlorothiazide (HYDRODIURIL) 12.5 MG tablet Take 1 tablet (12.5 mg total) by mouth daily.   lidocaine (LIDODERM) 5 % Place 1 patch onto the skin daily. 12 hours with patch on and then 12 hours with patch off   Melatonin 3 MG CAPS Take 1-2 capsules (3-6 mg total) by mouth at bedtime as needed (sleep).   mirabegron ER (MYRBETRIQ) 25 MG TB24 tablet Take 25 mg by mouth daily.   Multiple Vitamin (MULTIVITAMIN) tablet Take 1 tablet by mouth daily.   mycophenolate (MYFORTIC) 180 MG EC tablet Take 2 tablets (360 mg total) by mouth 2 (two) times daily.   pantoprazole (PROTONIX) 20 MG tablet TAKE 1 TABLET BY MOUTH EVERY DAY   polyethylene glycol powder (GLYCOLAX/MIRALAX) 17 GM/SCOOP powder Take 8.5 g by mouth daily. Hold for diarrhea   pravastatin (PRAVACHOL) 80 MG tablet TAKE 1 TABLET BY MOUTH  EVERY EVENING   tacrolimus (PROGRAF) 0.5 MG capsule 2 tabs in am and 2 at night   tiZANidine (ZANAFLEX) 2 MG tablet Take 1 tablet (2 mg total) by mouth 2 (two) times daily as needed for muscle spasms (sedation precautions).   travoprost, benzalkonium, (TRAVATAN) 0.004 % ophthalmic solution Place 1 drop into both eyes at bedtime.   No facility-administered encounter medications on file as of  09/13/2021.    Allergies as of 09/13/2021 - Review Complete 09/13/2021  Allergen Reaction Noted   Brimonidine  03/02/2014   Shellfish-derived products Swelling 03/02/2014   Tramadol Other (See Comments) 01/05/2020    Past Medical History:  Diagnosis Date   Anemia in chronic kidney disease    a. s/p aranesp 11/2013 now starting epo with HD 06/2014   Diabetes mellitus type II    a. Currently diet controlled since losing weight.   DJD (degenerative joint disease)    ESRD (end stage renal disease) ([place])     a. Followed @ [facility];  b. On dialysis since late 2015 (MWF).   ESRD (end stage renal disease) on dialysis [facility])    stage 5 per [facility] renal (Dr. Daphane Shepherd) considering transplant Access: LUE fistula Establishing with [facility]    Essential hypertension    GERD (gastroesophageal reflux disease)    Glaucoma    a. L>R   History of echocardiogram    a. 01/2015 Echo [facility]): EF ?55%, triv MR, mildly dil LA, nl PV.   History of stress test    a. 03/2013 Myoview [facility]): EF 63%, no ischemia.   History of Urge Incontinence    HLD (hyperlipidemia)    Osteoarthritis    Osteoporosis 05/03/2019   DEXA 04/2019 - 1.5 spine, -2.1 hip, -2.5 forearm Check phosphate and PTH to guide further management in h/o kidney transplant   Syncope    a. 12/2015.   Vaginal atrophy    a. vagifem caused spotting, normal TVS   Vitamin D deficiency     Past Surgical History:  Procedure Laterality Date   ANAL RECTAL MANOMETRY N/A 03/16/2021   Procedure: ANO RECTAL MANOMETRY;  Surgeon: Mauri Pole, MD;  Location: [facility] END[facility]COPY;  Service: Endoscopy;  Laterality: N/A;   BLADDER SUSPENSION  2011   for stress incontinence-Dr. MacDiarmid   BREAST BIOPSY Right 12/15/2020   "Q" clip @ 2 o'clock - fat necrosis   BREAST BIOPSY Right 12/15/2020   "X" clip @ 4 o'clock - fat necrosis   CATARACT EXTRACTION Right    CATARACT EXTRACTION Left 02/25/2021   COLONOSCOPY  02/04/2009   normal, rec rpt 10 yrs   COLONOSCOPY  03/2019   diverticulosis, rpt 10 yrs (Abdulai Blaylock)   KIDNEY TRANSPLANT Right 04/03/2017   [facility])   left eye laser surgery  06/2011   LIGATION OF ARTERIOVENOUS  FISTULA  10/2017   thrombosed L arm AFV   REFRACTIVE SURGERY     TUBAL LIGATION  1990s   bilateral    Family History  Problem Relation Age of Onset   Other Father        she did not know her father.   Diabetes Mother    Heart failure Mother        died @ 74   Stroke Brother    Hyperlipidemia Brother    Hypertension Brother     Hyperlipidemia Brother    Hypertension Brother    Hyperlipidemia Sister    Hypertension Sister    Hyperlipidemia Sister    Hypertension Sister    Heart failure Maternal Grandfather    Breast cancer Neg Hx    Colon cancer Neg Hx    Esophageal cancer Neg Hx    Stomach cancer Neg Hx    Rectal cancer Neg Hx     Social History   Socioeconomic History   Marital status: Married    Spouse name: Not on file   Number of children: 1  Years of education: Not on file   Highest education level: Not on file  Occupational History   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  Vaping Use   Vaping Use: Never used  Substance and Sexual Activity   Alcohol use: No    Alcohol/week: 0.0 standard drinks   Drug use: No   Sexual activity: Yes    Partners: Male  Other Topics Concern   Not on file  Social History Narrative   Lives with husband in [place].   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   Physical Exam: Vitals:   09/13/21 0933  BP: 122/60  Pulse: 66  SpO2: 98%   Body mass index is 26.04 kg/m. Gen:      No acute distress HEENT:  sclera anicteric Abd:      soft, non-tender; no palpable masses, no distension Ext:    No edema Neuro: alert and oriented x 3 Psych:  normal mood and affect  Data Reviewed:  Reviewed labs, radiology imaging, old records and pertinent past GI work up   Assessment and Plan/Recommendations:  70 year old very pleasant female with history of hypertension, diabetes s/p renal transplant with complaints of persistent fecal incontinence Evidence of weak internal anal sphincter on anorectal manometry No significant improvement of symptoms with pelvic floor physical therapy  Will refer to colorectal surgery to evaluate and manage persistent fecal incontinence and also to consider consider possible InterStim placement  Use Benefiber 1 tablespoon 2-3 times daily with meals, increase dose based on response Lactose-free diet  Return in 3 months  This visit required 40 minutes of patient care (this includes precharting, chart review, review of results, face-to-face time used for counseling as well as treatment plan and follow-up. The patient was provided an opportunity to ask questions and all were answered. The patient agreed with the plan and demonstrated an understanding of the instructions.  Damaris Hippo , MD    CC: Ria Bush, MD

## 2021-09-15 DIAGNOSIS — N3946 Mixed incontinence: Secondary | ICD-10-CM | POA: Diagnosis not present

## 2021-09-19 ENCOUNTER — Encounter: Payer: Self-pay | Admitting: Gastroenterology

## 2021-10-12 DIAGNOSIS — Z940 Kidney transplant status: Secondary | ICD-10-CM | POA: Diagnosis not present

## 2021-10-20 DIAGNOSIS — H5703 Miosis: Secondary | ICD-10-CM | POA: Diagnosis not present

## 2021-10-20 DIAGNOSIS — H2512 Age-related nuclear cataract, left eye: Secondary | ICD-10-CM | POA: Diagnosis not present

## 2021-10-20 DIAGNOSIS — H401133 Primary open-angle glaucoma, bilateral, severe stage: Secondary | ICD-10-CM | POA: Diagnosis not present

## 2021-10-20 DIAGNOSIS — H18413 Arcus senilis, bilateral: Secondary | ICD-10-CM | POA: Diagnosis not present

## 2021-11-02 DIAGNOSIS — Z940 Kidney transplant status: Secondary | ICD-10-CM | POA: Diagnosis not present

## 2021-11-14 DIAGNOSIS — R159 Full incontinence of feces: Secondary | ICD-10-CM | POA: Diagnosis not present

## 2021-11-23 DIAGNOSIS — E785 Hyperlipidemia, unspecified: Secondary | ICD-10-CM | POA: Diagnosis not present

## 2021-11-23 DIAGNOSIS — I10 Essential (primary) hypertension: Secondary | ICD-10-CM | POA: Diagnosis not present

## 2021-11-23 DIAGNOSIS — H2512 Age-related nuclear cataract, left eye: Secondary | ICD-10-CM | POA: Diagnosis not present

## 2021-11-23 DIAGNOSIS — Z87891 Personal history of nicotine dependence: Secondary | ICD-10-CM | POA: Diagnosis not present

## 2021-11-23 HISTORY — PX: CATARACT EXTRACTION W/ INTRAOCULAR LENS IMPLANT: SHX1309

## 2021-12-05 ENCOUNTER — Telehealth: Payer: Self-pay

## 2021-12-05 NOTE — Telephone Encounter (Signed)
May place tomorrow at 1pm if we have lunch coverage.  ?Recommend clear liquid diet overnight.  ?Ensure no fever ?

## 2021-12-05 NOTE — Telephone Encounter (Signed)
Spoke to patient by telephone and was advised that she can come for an appointment tomorrow 12/06/21 at 7:45 am. Patient was advised to follow a clear liquid diet tonight. Patient denies a fever. Patient was given ER precautions and she verbalized understanding.Patient stated the pain comes and goes. Patient stated that she appreciates being able to be seen in the morning.  ?

## 2021-12-05 NOTE — Telephone Encounter (Signed)
Pt has been having LLQ pain for 2 weeks. Has been constipated since this started. She has tried Stage manager without relief. She was asking for an OV with Dr Danise Mina but he is full. Asking if she can be worked in this week and what she can try. Denies N/V or issues with urination. Please advise at [phone] ?

## 2021-12-06 ENCOUNTER — Encounter: Payer: Self-pay | Admitting: Family Medicine

## 2021-12-06 ENCOUNTER — Ambulatory Visit (INDEPENDENT_AMBULATORY_CARE_PROVIDER_SITE_OTHER): Payer: Medicare Other | Admitting: Family Medicine

## 2021-12-06 ENCOUNTER — Other Ambulatory Visit: Payer: Self-pay

## 2021-12-06 VITALS — BP 128/78 | HR 61 | Temp 97.9°F | Ht 62.0 in | Wt 141.2 lb

## 2021-12-06 DIAGNOSIS — R159 Full incontinence of feces: Secondary | ICD-10-CM

## 2021-12-06 DIAGNOSIS — R152 Fecal urgency: Secondary | ICD-10-CM

## 2021-12-06 DIAGNOSIS — R109 Unspecified abdominal pain: Secondary | ICD-10-CM

## 2021-12-06 LAB — POC URINALSYSI DIPSTICK (AUTOMATED)
Bilirubin, UA: NEGATIVE
Blood, UA: NEGATIVE
Glucose, UA: NEGATIVE
Ketones, UA: NEGATIVE
Leukocytes, UA: NEGATIVE
Nitrite, UA: NEGATIVE
Protein, UA: POSITIVE — AB
Spec Grav, UA: 1.015 (ref 1.010–1.025)
Urobilinogen, UA: 0.2 E.U./dL
pH, UA: 6 (ref 5.0–8.0)

## 2021-12-06 LAB — CBC WITH DIFFERENTIAL/PLATELET
Basophils Absolute: 0 10*3/uL (ref 0.0–0.1)
Basophils Relative: 0.9 % (ref 0.0–3.0)
Eosinophils Absolute: 0.2 10*3/uL (ref 0.0–0.7)
Eosinophils Relative: 3.1 % (ref 0.0–5.0)
HCT: 39.2 % (ref 36.0–46.0)
Hemoglobin: 12.7 g/dL (ref 12.0–15.0)
Lymphocytes Relative: 15.5 % (ref 12.0–46.0)
Lymphs Abs: 0.8 10*3/uL (ref 0.7–4.0)
MCHC: 32.4 g/dL (ref 30.0–36.0)
MCV: 84 fl (ref 78.0–100.0)
Monocytes Absolute: 0.6 10*3/uL (ref 0.1–1.0)
Monocytes Relative: 10.5 % (ref 3.0–12.0)
Neutro Abs: 3.8 10*3/uL (ref 1.4–7.7)
Neutrophils Relative %: 70 % (ref 43.0–77.0)
Platelets: 218 10*3/uL (ref 150.0–400.0)
RBC: 4.67 Mil/uL (ref 3.87–5.11)
RDW: 13.9 % (ref 11.5–15.5)
WBC: 5.5 10*3/uL (ref 4.0–10.5)

## 2021-12-06 LAB — COMPREHENSIVE METABOLIC PANEL
ALT: 14 U/L (ref 0–35)
AST: 16 U/L (ref 0–37)
Albumin: 4.4 g/dL (ref 3.5–5.2)
Alkaline Phosphatase: 58 U/L (ref 39–117)
BUN: 23 mg/dL (ref 6–23)
CO2: 26 mEq/L (ref 19–32)
Calcium: 10 mg/dL (ref 8.4–10.5)
Chloride: 105 mEq/L (ref 96–112)
Creatinine, Ser: 1.04 mg/dL (ref 0.40–1.20)
GFR: 54.93 mL/min — ABNORMAL LOW (ref >60.00)
Glucose, Bld: 134 mg/dL — ABNORMAL HIGH (ref 70–99)
Potassium: 3.7 mEq/L (ref 3.5–5.1)
Sodium: 139 mEq/L (ref 135–145)
Total Bilirubin: 0.8 mg/dL (ref 0.2–1.2)
Total Protein: 7.2 g/dL (ref 6.0–8.3)

## 2021-12-06 MED ORDER — AMOXICILLIN-POT CLAVULANATE 875-125 MG PO TABS: 1.0000 | ORAL_TABLET | Freq: Two times a day (BID) | ORAL | 0 refills | Status: DC

## 2021-12-06 MED ORDER — AMOXICILLIN-POT CLAVULANATE 875-125 MG PO TABS: 1.0000 | ORAL_TABLET | Freq: Two times a day (BID) | ORAL | 0 refills | Status: AC

## 2021-12-06 NOTE — Addendum Note (Signed)
Addended by: Brenton Grills on: 11/05/8248 03:70 AM ? ? Modules accepted: Orders ? ?

## 2021-12-06 NOTE — Patient Instructions (Addendum)
Possible diverticulitis - check labs today.  ?Urine test returned ok.  ?Treat with clear liquid diet for 24-48 hours, then slowly advance as tolerated first with bland food then back to normal once feeling better.  ?Hold benefiber until feeling better.  ?If not improving with this, then fill antibiotic which I've printed out for you.  ?If any worsening, let me known for CT scan.  ? ?Diverticulitis ?Diverticulitis is infection or inflammation of small pouches (diverticula) in the colon that form due to a condition called diverticulosis. Diverticula can trap stool (feces) and bacteria, causing infection and inflammation. ?Diverticulitis may cause severe stomach pain and diarrhea. It may lead to tissue damage in the colon that causes bleeding or blockage. The diverticula may also burst (rupture) and cause infected stool to enter other areas of the abdomen. ?What are the causes? ?This condition is caused by stool becoming trapped in the diverticula, which allows bacteria to grow in the diverticula. This leads to inflammation and infection. ?What increases the risk? ?You are more likely to develop this condition if you have diverticulosis. The risk increases if you: ?Are overweight or obese. ?Do not get enough exercise. ?Drink alcohol. ?Use tobacco products. ?Eat a diet that has a lot of red meat such as beef, pork, or lamb. ?Eat a diet that does not include enough fiber. High-fiber foods include fruits, vegetables, beans, nuts, and whole grains. ?Are over 24 years of age. ?What are the signs or symptoms? ?Symptoms of this condition may include: ?Pain and tenderness in the abdomen. The pain is normally located on the left side of the abdomen, but it may occur in other areas. ?Fever and chills. ?Nausea. ?Vomiting. ?Cramping. ?Bloating. ?Changes in bowel routines. ?Blood in your stool. ?How is this diagnosed? ?This condition is diagnosed based on: ?Your medical history. ?A physical exam. ?Tests to make sure there is  nothing else causing your condition. These tests may include: ?Blood tests. ?Urine tests. ?CT scan of the abdomen. ?How is this treated? ?Most cases of this condition are mild and can be treated at home. Treatment may include: ?Taking over-the-counter pain medicines. ?Following a clear liquid diet. ?Taking antibiotic medicines by mouth. ?Resting. ?More severe cases may need to be treated at a hospital. Treatment may include: ?Not eating or drinking. ?Taking prescription pain medicine. ?Receiving antibiotic medicines through an IV. ?Receiving fluids and nutrition through an IV. ?Surgery. ?When your condition is under control, your health care provider may recommend that you have a colonoscopy. This is an exam to look at the entire large intestine. During the exam, a lubricated, bendable tube is inserted into the anus and then passed into the rectum, colon, and other parts of the large intestine. A colonoscopy can show how severe your diverticula are and whether something else may be causing your symptoms. ?Follow these instructions at home: ?Medicines ?Take over-the-counter and prescription medicines only as told by your health care provider. These include fiber supplements, probiotics, and stool softeners. ?If you were prescribed an antibiotic medicine, take it as told by your health care provider. Do not stop taking the antibiotic even if you start to feel better. ?Ask your health care provider if the medicine prescribed to you requires you to avoid driving or using machinery. ?Eating and drinking ? ?Follow a full liquid diet or another diet as directed by your health care provider. ?After your symptoms improve, your health care provider may tell you to change your diet. He or she may recommend that you eat a diet  that contains at least 25 grams (25 g) of fiber daily. Fiber makes it easier to pass stool. Healthy sources of fiber include: ?Berries. One cup contains 4-8 grams of fiber. ?Beans or lentils. One-half cup  contains 5-8 grams of fiber. ?Green vegetables. One cup contains 4 grams of fiber. ?Avoid eating red meat. ?General instructions ?Do not use any products that contain nicotine or tobacco, such as cigarettes, e-cigarettes, and chewing tobacco. If you need help quitting, ask your health care provider. ?Exercise for at least 30 minutes, 3 times each week. You should exercise hard enough to raise your heart rate and break a sweat. ?Keep all follow-up visits as told by your health care provider. This is important. You may need to have a colonoscopy. ?Contact a health care provider if: ?Your pain does not improve. ?Your bowel movements do not return to normal. ?Get help right away if: ?Your pain gets worse. ?Your symptoms do not get better with treatment. ?Your symptoms suddenly get worse. ?You have a fever. ?You vomit more than one time. ?You have stools that are bloody, black, or tarry. ?Summary ?Diverticulitis is infection or inflammation of small pouches (diverticula) in the colon that form due to a condition called diverticulosis. Diverticula can trap stool (feces) and bacteria, causing infection and inflammation. ?You are at higher risk for this condition if you have diverticulosis and you eat a diet that does not include enough fiber. ?Most cases of this condition are mild and can be treated at home. More severe cases may need to be treated at a hospital. ?When your condition is under control, your health care provider may recommend that you have an exam called a colonoscopy. This exam can show how severe your diverticula are and whether something else may be causing your symptoms. ?Keep all follow-up visits as told by your health care provider. This is important. ?This information is not intended to replace advice given to you by your health care provider. Make sure you discuss any questions you have with your health care provider. ?Document Revised: 06/30/2019 Document Reviewed: 06/30/2019 ?Elsevier Patient  Education ? [employer]. ? ?

## 2021-12-06 NOTE — Assessment & Plan Note (Addendum)
Ongoing, now seeing gen surgery ?

## 2021-12-06 NOTE — Progress Notes (Signed)
? ? Patient ID: Kristen Kirk, female    DOB: Sep 23, 1951, 71 y.o.   MRN: [id number] ? ?This visit was conducted in person. ? ?BP 128/78   Pulse 61   Temp 97.9 ?F (36.6 ?C) (Temporal)   Ht _0  (1.575 m)   Wt 141 lb 4 oz (64.1 kg)   LMP  (LMP Unknown)   SpO2 98%   BMI 25.83 kg/m?   ? ?CC: left side abd pain  ?Subjective:  ? ?HPI: ?Kristen Kirk is a 71 y.o. female presenting on 12/06/2021 for Abdominal Pain (C/o LLQ abd pain, L flank pain and constipation.  Sxs started about 2 wks ago. ) ? ? ?2 wk h/o intermittent L flank pain with radiation to LLQ abd, constipation.  ? ?No fevers/chills, diarrhea, nausea/vomiting or blood in stool. No urinary symptoms of dysuria, hematuria, frequency. Notes some chronic urgency.  ?Known diverticulosis by colonoscopy 03/2019.  ?No known h/o kidney stones.  ? ?She has been seeing GI ([facility]) for ongoing fecal incontinence managing on benefiber without much benefit. PFPT didn't really help. 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 She has seen general surgery (Dr Leighton Ruff) and discussion InterStim placement.  ? ?She just had L eye cataract extraction.  ?   ? ?Relevant past medical, surgical, family and social history reviewed and updated as indicated. Interim medical history since our last visit reviewed. ?Allergies and medications reviewed and updated. ?Outpatient Medications Prior to Visit  ?Medication Sig Dispense Refill  ? alendronate (FOSAMAX) 70 MG tablet Take 70 mg by mouth once a week. Take with a full glass of water on an empty stomach.    ? amLODipine (NORVASC) 2.5 MG tablet Take 2.5 mg by mouth daily.    ? apraclonidine (IOPIDINE) 0.5 % ophthalmic solution Place 1 drop into the left eye 2 (two) times daily.    ? aspirin 81 MG tablet Take 81 mg by mouth daily.    ? Blood Glucose Monitoring Suppl (ONE TOUCH ULTRA SYSTEM KIT) W/DEVICE KIT 1 kit by Does not apply route once. 1 each 0  ? carvedilol  (COREG) 25 MG tablet Take 1.5 tablets (37.5 mg total) by mouth 2 (two) times daily with a meal.    ? Cholecalciferol (VITAMIN D3) 1000 units CAPS Take 2 capsules (2,000 Units total) by mouth daily. 60 capsule   ? docusate sodium (COLACE) 100 MG capsule Take 100 mg by mouth 2 (two) times daily as needed.   0  ? dorzolamide-timolol (COSOPT) 22.3-6.8 MG/ML ophthalmic solution Place 1 drop into both eyes 3 (three) times daily.    ? famotidine (PEPCID) 20 MG tablet Take 1 tablet (20 mg total) by mouth 2 (two) times daily.    ? glucose blood test strip Check sugars once daily 100 each 11  ? hydrochlorothiazide (HYDRODIURIL) 12.5 MG tablet Take 1 tablet (12.5 mg total) by mouth daily. 90 tablet 3  ? Melatonin 3 MG CAPS Take 1-2 capsules (3-6 mg total) by mouth at bedtime as needed (sleep).  0  ? mirabegron ER (MYRBETRIQ) 25 MG TB24 tablet Take 25 mg by mouth daily.    ? Multiple Vitamin (MULTIVITAMIN) tablet Take 1 tablet by mouth daily.    ? mycophenolate (MYFORTIC) 180 MG EC tablet Take 2 tablets (360 mg total) by mouth 2 (two) times daily.    ? pantoprazole (PROTONIX) 20 MG tablet TAKE 1 TABLET BY MOUTH EVERY DAY 90 tablet 1  ? polyethylene glycol powder (  GLYCOLAX/MIRALAX) 17 GM/SCOOP powder Take 8.5 g by mouth daily. Hold for diarrhea 3350 g 1  ? pravastatin (PRAVACHOL) 80 MG tablet TAKE 1 TABLET BY MOUTH  EVERY EVENING 90 tablet 0  ? tacrolimus (PROGRAF) 0.5 MG capsule 2 tabs in am and 2 at night    ? tiZANidine (ZANAFLEX) 2 MG tablet Take 1 tablet (2 mg total) by mouth 2 (two) times daily as needed for muscle spasms (sedation precautions). 30 tablet 0  ? travoprost, benzalkonium, (TRAVATAN) 0.004 % ophthalmic solution Place 1 drop into both eyes at bedtime.    ? acetaminophen-codeine (TYLENOL #3) 300-30 MG tablet Take 0.5-1 tablets by mouth 2 (two) times daily as needed for moderate pain. 15 tablet 0  ? lidocaine (LIDODERM) 5 % Place 1 patch onto the skin daily. 12 hours with patch on and then 12 hours with patch off  30 patch 0  ? ?No facility-administered medications prior to visit.  ?  ? ?Per HPI unless specifically indicated in ROS section below ?Review of Systems ? ?Objective:  ?BP 128/78   Pulse 61   Temp 97.9 ?F (36.6 ?C) (Temporal)   Ht _0  (1.575 m)   Wt 141 lb 4 oz (64.1 kg)   LMP  (LMP Unknown)   SpO2 98%   BMI 25.83 kg/m?   ?Wt Readings from Last 3 Encounters:  ?12/06/21 141 lb 4 oz (64.1 kg)  ?09/13/21 142 lb 6 oz (64.6 kg)  ?04/29/21 137 lb 12 oz (62.5 kg)  ?  ?  ?Physical Exam ?Vitals and nursing note reviewed.  ?Constitutional:   ?   Appearance: Normal appearance. She is not ill-appearing.  ?Cardiovascular:  ?   Rate and Rhythm: Normal rate and regular rhythm.  ?   Pulses: Normal pulses.  ?   Heart sounds: Normal heart sounds. No murmur heard. ?Pulmonary:  ?   Effort: Pulmonary effort is normal. No respiratory distress.  ?   Breath sounds: Normal breath sounds. No wheezing, rhonchi or rales.  ?Abdominal:  ?   General: Bowel sounds are normal. There is no distension.  ?   Palpations: Abdomen is soft. There is no mass.  ?   Tenderness: There is abdominal tenderness (mild) in the right lower quadrant. There is no right CVA tenderness, left CVA tenderness, guarding or rebound. Negative signs include Murphy's sign.  ?   Hernia: No hernia is present.  ?Musculoskeletal:  ?   Right lower leg: No edema.  ?   Left lower leg: No edema.  ?Skin: ?   General: Skin is warm and dry.  ?Neurological:  ?   Mental Status: She is alert.  ?Psychiatric:     ?   Mood and Affect: Mood normal.     ?   Behavior: Behavior normal.  ? ?   ?Results for orders placed or performed in visit on 12/06/21  ?POCT Urinalysis Dipstick (Automated)  ?Result Value Ref Range  ? Color, UA yellow   ? Clarity, UA clear   ? Glucose, UA Negative Negative  ? Bilirubin, UA negative   ? Ketones, UA negative   ? Spec Grav, UA 1.015 1.010 - 1.025  ? Blood, UA negative   ? pH, UA 6.0 5.0 - 8.0  ? Protein, UA Positive (A) Negative  ? Urobilinogen, UA 0.2 0.2  or 1.0 E.U./dL  ? Nitrite, UA negative   ? Leukocytes, UA Negative Negative  ? ? ?Assessment & Plan:  ?This visit occurred during the SARS-CoV-2 public health emergency.  Safety protocols were in place, including screening questions prior to the visit, additional usage of staff PPE, and extensive cleaning of exam room while observing appropriate contact time as indicated for disinfecting solutions.  ? ?Problem List Items Addressed This Visit   ? ?  ? Acute  ? Incontinence of feces  ?  Ongoing, now seeing gen surgery ?  ?  ?  ? Other  ? Left sided abdominal pain - Primary  ?  Story suspicious for diverticulitis in h/o diverticulosis on latest colonoscopy. rec clear liquid diet for 1-2 days and if not improving, start augmentin anticipate which I printed out for her today. Reviewed pathophysiology of diverticulitis. Check CMP, CBC today. If worsening, to update [place] for abd/pelvis CT. Pt agrees with plan.  ?UA today not consistent with kidney stone.  ?  ?  ? Relevant Orders  ? Comprehensive metabolic panel  ? CBC with Differential/Platelet  ? POCT Urinalysis Dipstick (Automated) (Completed)  ?  ? ?Meds ordered this encounter  ?Medications  ? amoxicillin-clavulanate (AUGMENTIN) 875-125 MG tablet  ?  Sig: Take 1 tablet by mouth 2 (two) times daily for 10 days.  ?  Dispense:  20 tablet  ?  Refill:  0  ? ?Orders Placed This Encounter  ?Procedures  ? Comprehensive metabolic panel  ? CBC with Differential/Platelet  ? POCT Urinalysis Dipstick (Automated)  ? ? ?Patient instructions: ?Possible diverticulitis - check labs today.  ?Urine test returned ok.  ?Treat with clear liquid diet for 24-48 hours, then slowly advance as tolerated first with bland food then back to normal once feeling better.  ?Hold benefiber until feeling better.  ?If not improving with this, then fill antibiotic which I've printed out for you.  ?If any worsening, let me known for CT scan.  ? ?Follow up plan: ?Return if symptoms worsen or fail to  improve. ? ?Ria Bush, MD   ?

## 2021-12-06 NOTE — Assessment & Plan Note (Addendum)
Story suspicious for diverticulitis in h/o diverticulosis on latest colonoscopy. rec clear liquid diet for 1-2 days and if not improving, start augmentin anticipate which I printed out for her today. Reviewed pathophysiology of diverticulitis. Check CMP, CBC today. If worsening, to update [place] for abd/pelvis CT. Pt agrees with plan.  ?UA today not consistent with kidney stone.  ?

## 2021-12-09 ENCOUNTER — Other Ambulatory Visit: Payer: Self-pay | Admitting: Family Medicine

## 2021-12-09 DIAGNOSIS — Z1231 Encounter for screening mammogram for malignant neoplasm of breast: Secondary | ICD-10-CM

## 2021-12-16 ENCOUNTER — Telehealth: Payer: Self-pay | Admitting: *Deleted

## 2021-12-16 NOTE — Telephone Encounter (Signed)
Patient had her [facility] consult for Fecal incontinence with Leighton Ruff on 7/58. ?

## 2021-12-21 ENCOUNTER — Telehealth: Payer: Self-pay | Admitting: Family Medicine

## 2021-12-21 DIAGNOSIS — R109 Unspecified abdominal pain: Secondary | ICD-10-CM

## 2021-12-21 DIAGNOSIS — Z940 Kidney transplant status: Secondary | ICD-10-CM

## 2021-12-21 NOTE — Telephone Encounter (Signed)
Pt called in stated she is still having  ?Left sided abdominal pain would to discuss next steps . Please advise #[phone] ?

## 2021-12-23 DIAGNOSIS — E1122 Type 2 diabetes mellitus with diabetic chronic kidney disease: Secondary | ICD-10-CM | POA: Diagnosis not present

## 2021-12-23 DIAGNOSIS — Z940 Kidney transplant status: Secondary | ICD-10-CM | POA: Diagnosis not present

## 2021-12-23 DIAGNOSIS — D84821 Immunodeficiency due to drugs: Secondary | ICD-10-CM | POA: Diagnosis not present

## 2021-12-23 DIAGNOSIS — Z7982 Long term (current) use of aspirin: Secondary | ICD-10-CM | POA: Diagnosis not present

## 2021-12-23 DIAGNOSIS — I120 Hypertensive chronic kidney disease with stage 5 chronic kidney disease or end stage renal disease: Secondary | ICD-10-CM | POA: Diagnosis not present

## 2021-12-23 DIAGNOSIS — R7989 Other specified abnormal findings of blood chemistry: Secondary | ICD-10-CM | POA: Diagnosis not present

## 2021-12-23 DIAGNOSIS — Z79899 Other long term (current) drug therapy: Secondary | ICD-10-CM | POA: Diagnosis not present

## 2021-12-23 DIAGNOSIS — Z6825 Body mass index (BMI) 25.0-25.9, adult: Secondary | ICD-10-CM | POA: Diagnosis not present

## 2021-12-23 DIAGNOSIS — M549 Dorsalgia, unspecified: Secondary | ICD-10-CM | POA: Diagnosis not present

## 2021-12-23 DIAGNOSIS — N185 Chronic kidney disease, stage 5: Secondary | ICD-10-CM | POA: Diagnosis not present

## 2021-12-23 NOTE — Telephone Encounter (Signed)
Patient called back: ?She felt ok while on ABX  ?Her bowel movements are about the same ranging from loose to firm 2-3 times a day.  ?She is ok with getting CT done and would like to have it in [place].  ?

## 2021-12-23 NOTE — Telephone Encounter (Signed)
Lvm asking pt to call back.  Need to get answers to Dr. G's questions and relay message. 

## 2021-12-23 NOTE — Telephone Encounter (Signed)
Ongoing L abd pain for almost 6 wks now. Did she take augmentin antibiotic and how did she feel while on it? How are bowel movements?  ?Next step would probably be CT abd/pelvis for further evaluation. Let me know if she would like to proceed.  ?

## 2021-12-26 NOTE — Telephone Encounter (Signed)
Ordered CT abd/pelvis with contrast. ?Ensure drinks plenty of water after contrast administration to help kidneys clear the contrast.  ?

## 2021-12-26 NOTE — Addendum Note (Signed)
Addended by: Ria Bush on: 12/26/2021 05:27 PM ? ? Modules accepted: Orders ? ?

## 2021-12-27 NOTE — Telephone Encounter (Signed)
Patient notified as instructed by telephone and verbalized understanding. ?Patient requested  that if she is not at home to call her on her cell phone because she is in and out a lot. ?

## 2021-12-29 ENCOUNTER — Other Ambulatory Visit: Payer: Self-pay | Admitting: Family Medicine

## 2022-01-10 DIAGNOSIS — R159 Full incontinence of feces: Secondary | ICD-10-CM | POA: Diagnosis not present

## 2022-01-16 ENCOUNTER — Ambulatory Visit
Admission: RE | Admit: 2022-01-16 | Discharge: 2022-01-16 | Disposition: A | Discharge status: Home / Self Care | Payer: Medicare Other | Source: Ambulatory Visit | Attending: Family Medicine | Admitting: Family Medicine

## 2022-01-16 DIAGNOSIS — Z1231 Encounter for screening mammogram for malignant neoplasm of breast: Secondary | ICD-10-CM

## 2022-01-17 ENCOUNTER — Other Ambulatory Visit: Payer: Self-pay | Admitting: Family Medicine

## 2022-01-17 DIAGNOSIS — H6063 Unspecified chronic otitis externa, bilateral: Secondary | ICD-10-CM | POA: Diagnosis not present

## 2022-01-17 DIAGNOSIS — R928 Other abnormal and inconclusive findings on diagnostic imaging of breast: Secondary | ICD-10-CM

## 2022-01-17 DIAGNOSIS — H8101 Meniere's disease, right ear: Secondary | ICD-10-CM | POA: Diagnosis not present

## 2022-01-17 DIAGNOSIS — N63 Unspecified lump in unspecified breast: Secondary | ICD-10-CM

## 2022-01-19 DIAGNOSIS — N3946 Mixed incontinence: Secondary | ICD-10-CM | POA: Diagnosis not present

## 2022-01-19 DIAGNOSIS — R350 Frequency of micturition: Secondary | ICD-10-CM | POA: Diagnosis not present

## 2022-01-23 ENCOUNTER — Telehealth: Payer: Self-pay

## 2022-01-23 NOTE — Telephone Encounter (Signed)
Pt is asking for an update on abd CT.  Says she hasn't heard anything yet.    ?

## 2022-01-24 NOTE — Telephone Encounter (Signed)
No auth required ? ?Pt is going call [facility] to schedule.  ? ? ?

## 2022-01-26 DIAGNOSIS — Z940 Kidney transplant status: Secondary | ICD-10-CM | POA: Diagnosis not present

## 2022-01-30 DIAGNOSIS — R159 Full incontinence of feces: Secondary | ICD-10-CM | POA: Diagnosis not present

## 2022-02-08 ENCOUNTER — Other Ambulatory Visit: Payer: Medicare Other

## 2022-02-08 ENCOUNTER — Inpatient Hospital Stay: Admission: RE | Admit: 2022-02-08 | Payer: Medicare Other | Source: Ambulatory Visit

## 2022-02-09 DIAGNOSIS — H59039 Cystoid macular edema following cataract surgery, unspecified eye: Secondary | ICD-10-CM | POA: Diagnosis not present

## 2022-02-23 ENCOUNTER — Ambulatory Visit
Admission: RE | Admit: 2022-02-23 | Discharge: 2022-02-23 | Disposition: A | Discharge status: Home / Self Care | Payer: Medicare Other | Source: Ambulatory Visit | Attending: Family Medicine | Admitting: Family Medicine

## 2022-02-23 DIAGNOSIS — N63 Unspecified lump in unspecified breast: Secondary | ICD-10-CM | POA: Insufficient documentation

## 2022-02-23 DIAGNOSIS — N6489 Other specified disorders of breast: Secondary | ICD-10-CM | POA: Diagnosis not present

## 2022-02-23 DIAGNOSIS — R928 Other abnormal and inconclusive findings on diagnostic imaging of breast: Secondary | ICD-10-CM | POA: Diagnosis not present

## 2022-03-07 DIAGNOSIS — Z940 Kidney transplant status: Secondary | ICD-10-CM | POA: Diagnosis not present

## 2022-03-13 DIAGNOSIS — H401123 Primary open-angle glaucoma, left eye, severe stage: Secondary | ICD-10-CM | POA: Diagnosis not present

## 2022-03-24 ENCOUNTER — Encounter: Payer: Self-pay | Admitting: Emergency Medicine

## 2022-03-24 ENCOUNTER — Emergency Department: Payer: Medicare Other

## 2022-03-24 ENCOUNTER — Emergency Department
Admission: EM | Admit: 2022-03-24 | Discharge: 2022-03-24 | Disposition: A | Discharge status: Home / Self Care | Payer: Medicare Other | Attending: Student in an Organized Health Care Education/Training Program | Admitting: Student in an Organized Health Care Education/Training Program

## 2022-03-24 ENCOUNTER — Other Ambulatory Visit: Payer: Self-pay

## 2022-03-24 DIAGNOSIS — N3000 Acute cystitis without hematuria: Secondary | ICD-10-CM | POA: Insufficient documentation

## 2022-03-24 DIAGNOSIS — Z940 Kidney transplant status: Secondary | ICD-10-CM | POA: Diagnosis not present

## 2022-03-24 DIAGNOSIS — R051 Acute cough: Secondary | ICD-10-CM | POA: Diagnosis not present

## 2022-03-24 DIAGNOSIS — M791 Myalgia, unspecified site: Secondary | ICD-10-CM | POA: Diagnosis not present

## 2022-03-24 DIAGNOSIS — R5383 Other fatigue: Secondary | ICD-10-CM | POA: Diagnosis not present

## 2022-03-24 DIAGNOSIS — R8271 Bacteriuria: Secondary | ICD-10-CM | POA: Diagnosis not present

## 2022-03-24 DIAGNOSIS — E860 Dehydration: Secondary | ICD-10-CM | POA: Diagnosis not present

## 2022-03-24 DIAGNOSIS — R5381 Other malaise: Secondary | ICD-10-CM | POA: Diagnosis present

## 2022-03-24 DIAGNOSIS — I499 Cardiac arrhythmia, unspecified: Secondary | ICD-10-CM | POA: Diagnosis not present

## 2022-03-24 DIAGNOSIS — Z03818 Encounter for observation for suspected exposure to other biological agents ruled out: Secondary | ICD-10-CM | POA: Diagnosis not present

## 2022-03-24 DIAGNOSIS — I517 Cardiomegaly: Secondary | ICD-10-CM | POA: Diagnosis not present

## 2022-03-24 DIAGNOSIS — R059 Cough, unspecified: Secondary | ICD-10-CM | POA: Diagnosis not present

## 2022-03-24 LAB — URINALYSIS, ROUTINE W REFLEX MICROSCOPIC
Bilirubin Urine: NEGATIVE
Glucose, UA: NEGATIVE mg/dL
Hgb urine dipstick: NEGATIVE
Ketones, ur: NEGATIVE mg/dL
Nitrite: NEGATIVE
Protein, ur: NEGATIVE mg/dL
Specific Gravity, Urine: 1.005 (ref 1.005–1.030)
pH: 6 (ref 5.0–8.0)

## 2022-03-24 LAB — BASIC METABOLIC PANEL
Anion gap: 9 (ref 5–15)
BUN: 29 mg/dL — ABNORMAL HIGH (ref 8–23)
CO2: 20 mmol/L — ABNORMAL LOW (ref 22–32)
Calcium: 10 mg/dL (ref 8.9–10.3)
Chloride: 107 mmol/L (ref 98–111)
Creatinine, Ser: 1.28 mg/dL — ABNORMAL HIGH (ref 0.44–1.00)
GFR, Estimated: 45 mL/min — ABNORMAL LOW (ref >60)
Glucose, Bld: 189 mg/dL — ABNORMAL HIGH (ref 70–99)
Potassium: 4.2 mmol/L (ref 3.5–5.1)
Sodium: 136 mmol/L (ref 135–145)

## 2022-03-24 LAB — CBC
HCT: 44.3 % (ref 36.0–46.0)
Hemoglobin: 13.8 g/dL (ref 12.0–15.0)
MCH: 26.6 pg (ref 26.0–34.0)
MCHC: 31.2 g/dL (ref 30.0–36.0)
MCV: 85.4 fL (ref 80.0–100.0)
Platelets: 235 10*3/uL (ref 150–400)
RBC: 5.19 MIL/uL — ABNORMAL HIGH (ref 3.87–5.11)
RDW: 13.5 % (ref 11.5–15.5)
WBC: 7.2 10*3/uL (ref 4.0–10.5)
nRBC: 0 % (ref 0.0–0.2)

## 2022-03-24 LAB — LACTIC ACID, PLASMA: Lactic Acid, Venous: 1 mmol/L (ref 0.5–1.9)

## 2022-03-24 MED ORDER — SODIUM CHLORIDE 0.9 % IV SOLN
2.0000 g | Freq: Once | INTRAVENOUS | Status: AC
  Administered 2022-03-24: 2 g via INTRAVENOUS
  Filled 2022-03-24: qty 20

## 2022-03-24 MED ORDER — SODIUM CHLORIDE 0.9 % IV BOLUS
1000.0000 mL | Freq: Once | INTRAVENOUS | Status: AC
  Administered 2022-03-24: 1000 mL via INTRAVENOUS

## 2022-03-24 MED ORDER — CEPHALEXIN 500 MG PO CAPS: 500.0000 mg | ORAL_CAPSULE | Freq: Three times a day (TID) | ORAL | 0 refills | Status: AC

## 2022-03-24 NOTE — ED Triage Notes (Signed)
First Nurse Note:  Arrives from [facility] for ED evaluation of elevated creatinine.  Patient with history of kidney transplant.  Patient states she has had decreased appetite x 2-3 days.  AAOx3.  Skin warm and dry. NAD

## 2022-03-24 NOTE — ED Notes (Signed)
See triage note  presents from [facility] with some fatigue   Also states she has had decreased appetite  no fever or n/v

## 2022-03-24 NOTE — ED Triage Notes (Signed)
Pt to ED via [facility] for decreased appetite and fatigue for the past few days. Pt went to [facility] and had labs done. Pts Cr. Was 1.3, pt has hx/o Renal transplant. Pt is currently in NAD.

## 2022-03-24 NOTE — ED Provider Notes (Signed)
[facility] Provider Note    Event Date/Time   First MD Initiated Contact with Patient 03/24/22 1138     (approximate)   History   Fatigue and Abnormal Lab   HPI  Kristen Kirk is a 71 y.o. female status post renal transplant in 2019 on current Prograf presents to the ER for generalized malaise decreased appetite over the past several days.  Went to [place] clinic today had blood work checked shows that she has mild AKI and she was sent to the ER.  Denies any measured temperature.  Denies any dysuria.  No flank pain abdominal pain.     Physical Exam   Triage Vital Signs: ED Triage Vitals  Enc Vitals Group     BP 03/24/22 1127 (!) 132/106     Pulse Rate 03/24/22 1127 86     Resp 03/24/22 1127 16     Temp 03/24/22 1130 98.5 F (36.9 C)     Temp Source 03/24/22 1130 Oral     SpO2 03/24/22 1127 97 %     Weight 03/24/22 1127 140 lb (63.5 kg)     Height 03/24/22 1127 5\' 2"  (1.575 m)     Head Circumference --      Peak Flow --      Pain Score 03/24/22 1127 0     Pain Loc --      Pain Edu? --      Excl. in GC? --     Most recent vital signs: Vitals:   03/24/22 1331 03/24/22 1332  BP:    Pulse:  72  Resp:    Temp: 98.5 F (36.9 C)   SpO2:  97%     Constitutional: Alert  Eyes: Conjunctivae are normal.  Head: Atraumatic. Nose: No congestion/rhinnorhea. Mouth/Throat: Mucous membranes are moist.   Neck: Painless ROM.  Cardiovascular:   Good peripheral circulation. Respiratory: Normal respiratory effort.  No retractions.  Gastrointestinal: Soft and nontender.  Musculoskeletal:  no deformity Neurologic:  MAE spontaneously. No gross focal neurologic deficits are appreciated.  Skin:  Skin is warm, dry and intact. No rash noted. Psychiatric: Mood and affect are normal. Speech and behavior are normal.    ED Results / Procedures / Treatments   Labs (all labs ordered are listed, but only abnormal results are displayed) Labs Reviewed   BASIC METABOLIC PANEL - Abnormal; Notable for the following components:      Result Value   CO2 20 (*)    Glucose, Bld 189 (*)    BUN 29 (*)    Creatinine, Ser 1.28 (*)    GFR, Estimated 45 (*)    All other components within normal limits  CBC - Abnormal; Notable for the following components:   RBC 5.19 (*)    All other components within normal limits  LACTIC ACID, PLASMA  URINALYSIS, ROUTINE W REFLEX MICROSCOPIC  TACROLIMUS LEVEL  CBG MONITORING, ED        RADIOLOGY Please see ED Course for my review and interpretation.  I personally reviewed all radiographic images ordered to evaluate for the above acute complaints and reviewed radiology reports and findings.  These findings were personally discussed with the patient.  Please see medical record for radiology report.    PROCEDURES:  Critical Care performed: No  Procedures   MEDICATIONS ORDERED IN ED: Medications  sodium chloride 0.9 % bolus 1,000 mL (0 mLs Intravenous Stopped 03/24/22 1324)  cefTRIAXone (ROCEPHIN) 2 g in sodium chloride 0.9 % 100 mL  IVPB (2 g Intravenous New Bag/Given 03/24/22 1329)     IMPRESSION / MDM / ASSESSMENT AND PLAN / ED COURSE  I reviewed the triage vital signs and the nursing notes.                              Differential diagnosis includes, but is not limited to, AKI, rejection, UTI, pyelo-, dehydration, electrolyte abnormality  Patient presented to the ER for evaluation of above symptoms and concern for AKI in the setting of transplant.  This presenting complaint could reflect a potentially life-threatening illness therefore the patient will be placed on continuous pulse oximetry and telemetry for monitoring.  Laboratory evaluation will be sent to evaluate for the above complaints.      Clinical Course as of 03/24/22 1418  Fri Mar 24, 2022  1216 Patient with mild AKI as compared to previous but no acidemia no white count.  We will check urine will give IV fluids we will check  Prograf level.  Need admission to the hospital given her renal transplant status with AKI and evidence of UTI.  She does not have any fever no white count will order IV Rocephin send urine culture.. [PR]  1327 Patient reassessed.  She feels improved after IV fluids.  [PR]  1401 Chest x-ray by my interpretation without evidence of infiltrate or edema. [PR]  1413 Patient reassessed.  We discussed option for hospitalization for IV fluids and further monitoring given her transplant status but the patient is feeling well she is tolerating p.o. work-up is reassuring she does not have any sepsis criteria.  I do think that trial of outpatient follow-up is reasonable.  Discussed return precautions.  Patient agreeable plan. [PR]    Clinical Course User Index [PR] Willy Eddy, MD    Patient's presentation is most consistent with acute presentation with potential threat to life or bodily function.   FINAL CLINICAL IMPRESSION(S) / ED DIAGNOSES   Final diagnoses:  Dehydration  Acute cystitis without hematuria     Rx / DC Orders   ED Discharge Orders          Ordered    cephALEXin (KEFLEX) 500 MG capsule  3 times daily        03/24/22 1415             Note:  This document was prepared using Dragon voice recognition software and may include unintentional dictation errors.    Willy Eddy, MD 03/24/22 [phone]

## 2022-03-25 ENCOUNTER — Emergency Department
Admission: EM | Admit: 2022-03-25 | Discharge: 2022-03-25 | Disposition: A | Discharge status: Home / Self Care | Payer: Medicare Other | Attending: Emergency Medicine | Admitting: Emergency Medicine

## 2022-03-25 ENCOUNTER — Other Ambulatory Visit: Payer: Self-pay

## 2022-03-25 ENCOUNTER — Emergency Department: Payer: Medicare Other

## 2022-03-25 ENCOUNTER — Encounter: Payer: Self-pay | Admitting: Emergency Medicine

## 2022-03-25 DIAGNOSIS — M25571 Pain in right ankle and joints of right foot: Secondary | ICD-10-CM | POA: Diagnosis not present

## 2022-03-25 LAB — URINE CULTURE: Culture: NO GROWTH

## 2022-03-26 LAB — TACROLIMUS LEVEL: Tacrolimus (FK506) - LabCorp: 5.8 ng/mL (ref 2.0–20.0)

## 2022-03-27 DIAGNOSIS — Z940 Kidney transplant status: Secondary | ICD-10-CM | POA: Diagnosis not present

## 2022-03-29 ENCOUNTER — Encounter: Payer: Self-pay | Admitting: Family Medicine

## 2022-03-29 ENCOUNTER — Ambulatory Visit (INDEPENDENT_AMBULATORY_CARE_PROVIDER_SITE_OTHER): Payer: Medicare Other | Admitting: Family Medicine

## 2022-03-29 VITALS — BP 124/70 | HR 66 | Temp 98.0°F | Ht 62.0 in | Wt 140.1 lb

## 2022-03-29 DIAGNOSIS — Z940 Kidney transplant status: Secondary | ICD-10-CM

## 2022-03-29 DIAGNOSIS — M76821 Posterior tibial tendinitis, right leg: Secondary | ICD-10-CM

## 2022-03-29 NOTE — Progress Notes (Signed)
Kristen Kirk T. [name], MD, [place] at [facility] [place], [address]  Phone: [phone]  FAX: [place] - 71 y.o. female  MRN [id number]  Date of Birth: 10-01-1951  Date: 03/29/2022  PCP: Ria Bush, MD  Referral: Ria Bush, MD  Chief Complaint  Patient presents with   Foot Pain    Right-Didn't start hurting her until she was seen in ED on 03/24/22.  Then went back about foot on 03/25/22   Subjective:   Kristen Kirk is a 71 y.o. very pleasant female patient with Body mass index is 25.62 kg/m. who presents with the following:  She presents with some acute foot pain as well as dysuria and urgency.  F/u renal function today.  03/24/2022, Cr elevated to 1.28, and on 6/26 f/u labs with Nephrology, it came down to 1.05.  H/o renal transplant in the past.  R foot: She is a really nice lady, remember her well from other office visits.  She went to the ER, and after this she did develop some right-sided ankle pain, medial greater than lateral.  She also has a history of kidney transplant, and she was bit worried that some of this may have done with the Medication she is on and some impacted kidney.  I did review her most recent labs from nephrology from a few days ago.  They all look stable and normal.  Creatinine has improved.  No trauma or injury that is recalled.  No significant repetitive motion, no significant repetitive walking or any other things that may have stressed the foot or ankle.    Review of Systems is noted in the HPI, as appropriate  Objective:   BP 124/70   Pulse 66   Temp 98 F (36.7 C) (Oral)   Ht _0  (1.575 m)   Wt 140 lb 1 oz (63.5 kg)   LMP  (LMP Unknown)   SpO2 98%   BMI 25.62 kg/m   GEN: No acute distress; alert,appropriate. PULM: Breathing comfortably in no respiratory distress PSYCH: Normally interactive.   Left foot and ankle: Entirety  of the bony foot and ankle exam as well as ligamentous structures and tendons are entirely normal.  Right foot and ankle.  Nontender throughout the entirety of the forefoot.  The midfoot is nontender, all structures.  Calcaneus is nontender  Achilles is minimally tender distally She does have some marked tenderness at the posterior tibialis tendon and to a significantly lesser extent at the peroneal tendon. CFL, ATFL, deltoid ligaments are all fully intact and nontender.  Drawer testing is normal.  Laboratory and Imaging Data:  Assessment and Plan:     ICD-10-CM   1. Tibialis tendinitis of right lower extremity  M76.821     2. Kidney transplant status  Z94.0      Total encounter time: 30 minutes. This includes total time spent on the day of encounter.  Additional time in chart spent on nephrology review, multiple ER visits, lab review.  I reviewed all of her nephrology labs with her face-to-face.  Exam consistent with posterior tibialis tendinopathy and to a lesser extent peroneal tendinopathy.  At least acute irritation and tendinitis.  I do not have a specific reason, but this does appear to be musculoskeletal in character.  This is improving after a few days.  She was worried that this could be from her kidney or potentially some Rocephin, and I reassured her  that I would see how that would be possible.  Continue to ice multiple times per day, Tylenol, rest.  Medication Management during today's office visit: No orders of the defined types were placed in this encounter.  There are no discontinued medications.  Orders placed today for conditions managed today: No orders of the defined types were placed in this encounter.   Follow-up if needed: No follow-ups on file.  Dr[employer] One speech-to-text software was used for transcription in this dictation.  Possible transcriptional errors can occur using Editor, commissioning.   Signed,  Maud Deed. Culley Hedeen, MD   Outpatient  Encounter Medications as of 03/29/2022  Medication Sig   alendronate (FOSAMAX) 70 MG tablet Take 70 mg by mouth once a week. Take with a full glass of water on an empty stomach.   amLODipine (NORVASC) 2.5 MG tablet Take 2.5 mg by mouth daily.   apraclonidine (IOPIDINE) 0.5 % ophthalmic solution Place 1 drop into the left eye 2 (two) times daily.   aspirin 81 MG tablet Take 81 mg by mouth daily.   Blood Glucose Monitoring Suppl (ONE TOUCH ULTRA SYSTEM KIT) W/DEVICE KIT 1 kit by Does not apply route once.   carvedilol (COREG) 25 MG tablet Take 1.5 tablets (37.5 mg total) by mouth 2 (two) times daily with a meal.   cephALEXin (KEFLEX) 500 MG capsule Take 1 capsule (500 mg total) by mouth 3 (three) times daily for 7 days.   Cholecalciferol (VITAMIN D3) 1000 units CAPS Take 2 capsules (2,000 Units total) by mouth daily.   docusate sodium (COLACE) 100 MG capsule Take 100 mg by mouth 2 (two) times daily as needed.    dorzolamide-timolol (COSOPT) 22.3-6.8 MG/ML ophthalmic solution Place 1 drop into both eyes 3 (three) times daily.   famotidine (PEPCID) 20 MG tablet Take 1 tablet (20 mg total) by mouth 2 (two) times daily.   glucose blood test strip Check sugars once daily   hydrochlorothiazide (HYDRODIURIL) 12.5 MG tablet Take 1 tablet (12.5 mg total) by mouth daily.   Melatonin 3 MG CAPS Take 1-2 capsules (3-6 mg total) by mouth at bedtime as needed (sleep).   mirabegron ER (MYRBETRIQ) 25 MG TB24 tablet Take 25 mg by mouth daily.   Multiple Vitamin (MULTIVITAMIN) tablet Take 1 tablet by mouth daily.   mycophenolate (MYFORTIC) 180 MG EC tablet Take 2 tablets (360 mg total) by mouth 2 (two) times daily.   pantoprazole (PROTONIX) 20 MG tablet TAKE 1 TABLET BY MOUTH EVERY DAY   polyethylene glycol powder (GLYCOLAX/MIRALAX) 17 GM/SCOOP powder Take 8.5 g by mouth daily. Hold for diarrhea   pravastatin (PRAVACHOL) 80 MG tablet TAKE 1 TABLET BY MOUTH  EVERY EVENING   tacrolimus (PROGRAF) 0.5 MG capsule 2 tabs  in am and 2 at night   tiZANidine (ZANAFLEX) 2 MG tablet Take 1 tablet (2 mg total) by mouth 2 (two) times daily as needed for muscle spasms (sedation precautions).   travoprost, benzalkonium, (TRAVATAN) 0.004 % ophthalmic solution Place 1 drop into both eyes at bedtime.   No facility-administered encounter medications on file as of 03/29/2022.

## 2022-04-03 ENCOUNTER — Ambulatory Visit: Payer: Self-pay | Admitting: General Surgery

## 2022-04-03 DIAGNOSIS — R159 Full incontinence of feces: Secondary | ICD-10-CM | POA: Diagnosis not present

## 2022-04-03 NOTE — H&P (Signed)
PROVIDER:  Monico Blitz, MD   MRN: [id number] DOB: 01-Aug-1951 DATE OF ENCOUNTER: 04/03/2022   Subjective    Chief Complaint: No chief complaint on file.       History of Present Illness: Kristen Kirk is a 71 y.o. female who was seen recently as an office consultation for fecal incontinence.  She presented to the office with approximately 1 year of worsening incontinence.  She states that she has leakage on a pad several times a day.  This is worse with loose stools.  She has regular bowel function on most days.  She underwent anorectal manometry and was noted to have decreased resting tone but good squeeze pressure.  We switched her to fiber daily instead of miralax.  She is having formed stools but continues to have some leakage.   Review of Systems: A complete review of systems was obtained from the patient.  I have reviewed this information and discussed as appropriate with the patient.  See HPI as well for other ROS.     Medical History: Past Medical History      Past Medical History:  Diagnosis Date   Cataract cortical, senile     Claustrophobia      Mild per patient statement, O2 mask ok   Diabetes mellitus without complication (CMS-HCC)      Past hx, now no longer requiring medications   Glaucoma (increased eye pressure)      POAG OU   High cholesterol     Hypertension             Patient Active Problem List  Diagnosis   Hyperlipidemia   GERD (gastroesophageal reflux disease)   Chronic kidney disease   POAG (primary open-angle glaucoma)   Nuclear sclerotic cataract of both eyes - Both Eyes   Acquired leg length discrepancy   Advanced care planning/counseling discussion   Medicare annual wellness visit, subsequent   Alopecia   Arteriovenous fistula thrombosis (CMS-HCC)   ASCUS of cervix with negative high risk HPV   Chronic lower back pain   Blurry vision   Constipation due to outlet dysfunction   Incontinence of feces   Mixed incontinence urge  and stress   Type II diabetes mellitus with renal manifestations, uncontrolled   Disorder of rotator cuff syndrome of shoulder and allied disorder   Dizziness   Vertigo   Encopresis, nonorganic origin   Essential hypertension   Flatulence, eructation and gas pain   Glaucoma   Insomnia   Iron deficiency   Kidney transplant status   Left sided abdominal pain, unspecified   Memory impairment   Muscle cramps   Osteoporosis   Postmenopausal atrophic vaginitis   Rectal bleeding   Right shoulder pain   Syncope and collapse   Unspecified osteoarthritis, unspecified site   Thoracic spine pain   Urge incontinence   Vitamin D deficiency   Disturbance of salivary secretion   Xerostomia   Chronic kidney disease, stage V (very severe) (CMS-HCC)   Chronic kidney disease   Gastro-esophageal reflux disease without esophagitis   Hyperlipidemia associated with type 2 diabetes mellitus (CMS-HCC)   Hyperlipidemia   Mixed hyperlipidemia   Primary open angle glaucoma (POAG) of both eyes, severe stage      Past Surgical History       Past Surgical History:  Procedure Laterality Date   GLAUCOMA EYE SURGERY Left 09/2006    SLT   GLAUCOMA EYE SURGERY Left 05/29/2011    SLT   EYE SURGERY Left  08/2011    TRAB   GLAUCOMA EYE SURGERY Left 08/30/2011    Trab [facility]   TRABECULECTOMY W/SCARRING Right 04/17/2012    Procedure: TRABECULECTOMY W/SCARRING;  Surgeon: Wallace Cullens, MD;  Location: [place];  Service: Ophthalmology;  Laterality: Right;   TRANSPLANT KIDNEY   04/02/2017   EXTRACTION CATARACT EXTRACAPSULAR W/INSERTION INTRAOCULAR PROSTHESIS Right 02/11/2020    Procedure: EXTRACAPSULAR CATARACT PHACO REMOVAL WITH INSERTION OF INTRAOCULAR LENS PROSTHESIS (1 STAGE PROCEDURE), MANUAL OR MECHANICAL TECHNIQUE; WITHOUT ENDOSCOPIC CYCLOPHOTOCOAGULATION;  Surgeon: Wallace Cullens, MD;  Location: [place];  Service: Ophthalmology;  Laterality: Right;   INSERTION AQUEOUS SHUNT Right 02/11/2020     Procedure: AQUEOUS SHUNT TO EXTRAOCULAR EQUATORIAL PLATE RESERVOIR, EXTERNAL APPROACH; WITH GRAFT;  Surgeon: Wallace Cullens, MD;  [facility];  Service: Ophthalmology;  Laterality: Right;   INSERTION AQUEOUS SHUNT Left 02/25/2021    Procedure: INSERTION AQUEOUS SHUNT;  Surgeon: Wallace Cullens, MD;  [facility];  Service: Ophthalmology;  Laterality: Left;   EXTRACTION CATARACT EXTRACAPSULAR W/INSERTION INTRAOCULAR PROSTHESIS Left 11/23/2021    Procedure: EXTRACTION CATARACT EXTRACAPSULAR W/INSERTION INTRAOCULAR PROSTHESIS;  Surgeon: Wallace Cullens, MD;  Location: [place];  Service: Ophthalmology;  Laterality: Left;   GLAUCOMA EYE SURGERY        Trab OD   SLING FOR STRESS INCONTINENCE N/A     TRANSPLANT KIDNEY            Allergies       Allergies  Allergen Reactions   Shellfish Containing Products Swelling   Brimonidine Other (See Comments)      eye redness   Tramadol Dizziness      Dizziness even at 1/2 tab              Current Outpatient Medications on File Prior to Visit  Medication Sig Dispense Refill   acetaminophen (TYLENOL) 500 MG tablet Take 500 mg by mouth as needed for Pain       alendronate (FOSAMAX) 70 MG tablet Take 70 mg by mouth every 7 (seven) days Take with a full glass of water. Do not lie down for the next 30 min.       amLODIPine (NORVASC) 10 MG tablet Take 10 mg by mouth once daily          aspirin 81 MG chewable tablet Take 81 mg by mouth.       carvedilol (COREG) 6.25 MG tablet Take by mouth Twice daily.       cholecalciferol (VITAMIN D3) 1,000 unit tablet Take by mouth.       ciprofloxacin HCl (CILOXAN) 0.3 % ophthalmic solution Place 1 drop into the left eye 4 (four) times daily Start day of surgery 5 mL 0   cyclobenzaprine (FLEXERIL) 5 MG tablet Take 5 mg by mouth as needed       DITROPAN XL 10 mg XL tablet Take by mouth Daily       dorzolamide-timoloL (COSOPT) 22.3-6.8 mg/mL ophthalmic solution Place 1 drop into both eyes 2  (two) times daily 30 mL 3   FUROsemide (LASIX) 40 MG tablet         hydrALAZINE (APRESOLINE) 25 MG tablet Take 25 mg by mouth 3 (three) times daily       HYZAAR 100-12.5 mg tablet Take by mouth       ketorolac (ACULAR) 0.5 % ophthalmic solution Place 1 drop into the left eye 4 (four) times daily 10 mL 2   latanoprost (XALATAN) 0.005 % ophthalmic solution Place 1  drop into the right eye at bedtime 7.5 mL 3   mirabegron (MYRBETRIQ) 50 mg ER tablet Take 50 mg by mouth daily.       mycophenolate (MYFORTIC) 180 MG EC tablet Take 180 mg by mouth 2 (two) times daily.       pantoprazole (PROTONIX) 20 MG DR tablet Take 20 mg by mouth once daily       polyethylene glycol (MIRALAX) powder Take 4.25 g by mouth as needed       pravastatin (PRAVACHOL) 80 MG tablet Take 80 mg by mouth Daily       prednisoLONE acetate (PRED FORTE) 1 % ophthalmic suspension Place 1 drop into the left eye 4 (four) times daily 10 mL 2   sodium bicarbonate 650 MG tablet Take 650 mg by mouth 4 (four) times daily       tacrolimus (PROGRAF) 1 MG capsule Take 1 mg by mouth 2 (two) times daily Takes 2 in am. And 2 at night         traZODone (DESYREL) 50 MG tablet          No current facility-administered medications on file prior to visit.      Family History       Family History  Problem Relation Age of Onset   High blood pressure (Hypertension) Mother     Diabetes Mother     Hyperlipidemia (Elevated cholesterol) Brother     High blood pressure (Hypertension) Brother     Cataracts Maternal Grandmother     Glaucoma Neg Hx     Macular degeneration Neg Hx     Anesthesia problems Neg Hx          Social History       Tobacco Use  Smoking Status Former  Smokeless Tobacco Never      Social History  Social History         Socioeconomic History   Marital status: Married      Spouse name: Tytionna Cloyd   Number of children: 1  Occupational History   Occupation: Retired  Tobacco Use   Smoking status: Former    Smokeless tobacco: Never  Scientific laboratory technician Use: Never used  Substance and Sexual Activity   Alcohol use: No   Drug use: No   Sexual activity: Yes      Partners: Male        Objective:      Exam Gen: NAD Abd: soft Rectal: see below     Labs, Imaging and Diagnostic Testing: Anal manometry shows decreasing resting tone with good squeeze pressure     Bowel diary.  Patient completed this for 20 days.  She recorded 42 episodes of leakage noted.  She reported approximately 10 episodes of loose stools.   Assessment and Plan:  There are no diagnoses linked to this encounter.  71 year old female with 1 year history of worsening fecal incontinence.  Anal manometry shows decreased internal sphincter function.  On exam she was noted to have soft stool in her rectal vault.  I recommended that she stop taking MiraLAX and use a fiber supplement on a daily basis to help bulk up her bowel movements.  I will see her back in approximately 2 months to see if she has made any progress.  If not, we will have her repeat the bowel diary.  I think she would be an excellent candidate for sacral nerve stimulator placement once she gets her BM's more formed   Elmo Putt  Marijo Conception, MD Colon and Rectal Surgery [facility]

## 2022-04-06 ENCOUNTER — Other Ambulatory Visit: Payer: Self-pay | Admitting: Family Medicine

## 2022-04-06 DIAGNOSIS — E1121 Type 2 diabetes mellitus with diabetic nephropathy: Secondary | ICD-10-CM

## 2022-04-06 DIAGNOSIS — E559 Vitamin D deficiency, unspecified: Secondary | ICD-10-CM

## 2022-04-06 DIAGNOSIS — E1169 Type 2 diabetes mellitus with other specified complication: Secondary | ICD-10-CM

## 2022-04-06 DIAGNOSIS — M818 Other osteoporosis without current pathological fracture: Secondary | ICD-10-CM

## 2022-04-06 DIAGNOSIS — Z940 Kidney transplant status: Secondary | ICD-10-CM

## 2022-04-07 ENCOUNTER — Other Ambulatory Visit (INDEPENDENT_AMBULATORY_CARE_PROVIDER_SITE_OTHER): Payer: Medicare Other

## 2022-04-07 DIAGNOSIS — E559 Vitamin D deficiency, unspecified: Secondary | ICD-10-CM

## 2022-04-07 DIAGNOSIS — E1121 Type 2 diabetes mellitus with diabetic nephropathy: Secondary | ICD-10-CM

## 2022-04-07 DIAGNOSIS — E1169 Type 2 diabetes mellitus with other specified complication: Secondary | ICD-10-CM

## 2022-04-07 DIAGNOSIS — E785 Hyperlipidemia, unspecified: Secondary | ICD-10-CM | POA: Diagnosis not present

## 2022-04-07 LAB — COMPREHENSIVE METABOLIC PANEL
ALT: 12 U/L (ref 0–35)
AST: 13 U/L (ref 0–37)
Albumin: 4.3 g/dL (ref 3.5–5.2)
Alkaline Phosphatase: 65 U/L (ref 39–117)
BUN: 33 mg/dL — ABNORMAL HIGH (ref 6–23)
CO2: 24 mEq/L (ref 19–32)
Calcium: 9.9 mg/dL (ref 8.4–10.5)
Chloride: 104 mEq/L (ref 96–112)
Creatinine, Ser: 1.03 mg/dL (ref 0.40–1.20)
GFR: 55.44 mL/min — ABNORMAL LOW (ref >60.00)
Glucose, Bld: 136 mg/dL — ABNORMAL HIGH (ref 70–99)
Potassium: 3.9 mEq/L (ref 3.5–5.1)
Sodium: 140 mEq/L (ref 135–145)
Total Bilirubin: 0.6 mg/dL (ref 0.2–1.2)
Total Protein: 7.1 g/dL (ref 6.0–8.3)

## 2022-04-07 LAB — LIPID PANEL
Cholesterol: 145 mg/dL (ref 0–200)
HDL: 42.1 mg/dL (ref >39.00)
LDL Cholesterol: 83 mg/dL (ref 0–99)
NonHDL: 102.78
Total CHOL/HDL Ratio: 3
Triglycerides: 100 mg/dL (ref 0.0–149.0)
VLDL: 20 mg/dL (ref 0.0–40.0)

## 2022-04-07 LAB — HEMOGLOBIN A1C: Hgb A1c MFr Bld: 7.7 % — ABNORMAL HIGH (ref 4.6–6.5)

## 2022-04-07 LAB — VITAMIN D 25 HYDROXY (VIT D DEFICIENCY, FRACTURES): VITD: 47.23 ng/mL (ref 30.00–100.00)

## 2022-04-12 ENCOUNTER — Ambulatory Visit (INDEPENDENT_AMBULATORY_CARE_PROVIDER_SITE_OTHER): Payer: Medicare Other

## 2022-04-12 VITALS — Wt 140.0 lb

## 2022-04-12 DIAGNOSIS — Z0000 Encounter for general adult medical examination without abnormal findings: Secondary | ICD-10-CM | POA: Diagnosis not present

## 2022-04-12 DIAGNOSIS — Z780 Asymptomatic menopausal state: Secondary | ICD-10-CM

## 2022-04-12 NOTE — Patient Instructions (Signed)
Kristen Kirk , Thank you for taking time to come for your Medicare Wellness Visit. I appreciate your ongoing commitment to your health goals. Please review the following plan we discussed and let me know if I can assist you in the future.   Screening recommendations/referrals: Colonoscopy: 03/25/19 Mammogram: 02/23/22 Bone Density: referral sent Recommended yearly ophthalmology/optometry visit for glaucoma screening and checkup Recommended yearly dental visit for hygiene and checkup  Vaccinations: Influenza vaccine: 07/19/21 Pneumococcal vaccine: 09/29/20 Tdap vaccine: 10/30/07, due if have injury Shingles vaccine: Shingrix 07/03/18, 10/28/20   Covid-19:11/14/19, 12/05/19, 06/14/20  Advanced directives: no  Conditions/risks identified: none  Next appointment: Follow up in one year for your annual wellness visit 04/16/23 @ 10 AM by phone   Preventive Care 65 Years and Older, Female Preventive care refers to lifestyle choices and visits with your health care provider that can promote health and wellness. What does preventive care include? A yearly physical exam. This is also called an annual well check. Dental exams once or twice a year. Routine eye exams. Ask your health care provider how often you should have your eyes checked. Personal lifestyle choices, including: Daily care of your teeth and gums. Regular physical activity. Eating a healthy diet. Avoiding tobacco and drug use. Limiting alcohol use. Practicing safe sex. Taking low-dose aspirin every day. Taking vitamin and mineral supplements as recommended by your health care provider. What happens during an annual well check? The services and screenings done by your health care provider during your annual well check will depend on your age, overall health, lifestyle risk factors, and family history of disease. Counseling  Your health care provider may ask you questions about your: Alcohol use. Tobacco use. Drug use. Emotional  well-being. Home and relationship well-being. Sexual activity. Eating habits. History of falls. Memory and ability to understand (cognition). Work and work Statistician. Reproductive health. Screening  You may have the following tests or measurements: Height, weight, and BMI. Blood pressure. Lipid and cholesterol levels. These may be checked every 5 years, or more frequently if you are over 36 years old. Skin check. Lung cancer screening. You may have this screening every year starting at age 81 if you have a 30-pack-year history of smoking and currently smoke or have quit within the past 15 years. Fecal occult blood test (FOBT) of the stool. You may have this test every year starting at age 36. Flexible sigmoidoscopy or colonoscopy. You may have a sigmoidoscopy every 5 years or a colonoscopy every 10 years starting at age 82. Hepatitis C blood test. Hepatitis B blood test. Sexually transmitted disease (STD) testing. Diabetes screening. This is done by checking your blood sugar (glucose) after you have not eaten for a while (fasting). You may have this done every 1-3 years. Bone density scan. This is done to screen for osteoporosis. You may have this done starting at age 61. Mammogram. This may be done every 1-2 years. Talk to your health care provider about how often you should have regular mammograms. Talk with your health care provider about your test results, treatment options, and if necessary, the need for more tests. Vaccines  Your health care provider may recommend certain vaccines, such as: Influenza vaccine. This is recommended every year. Tetanus, diphtheria, and acellular pertussis (Tdap, Td) vaccine. You may need a Td booster every 10 years. Zoster vaccine. You may need this after age 15. Pneumococcal 13-valent conjugate (PCV13) vaccine. One dose is recommended after age 87. Pneumococcal polysaccharide (PPSV23) vaccine. One dose is recommended after  age 2. Talk to your  health care provider about which screenings and vaccines you need and how often you need them. This information is not intended to replace advice given to you by your health care provider. Make sure you discuss any questions you have with your health care provider. Document Released: 10/15/2015 Document Revised: 06/07/2016 Document Reviewed: 07/20/2015 Elsevier Interactive Patient Education  2017 [place] Prevention in the Home Falls can cause injuries. They can happen to people of all ages. There are many things you can do to make your home safe and to help prevent falls. What can I do on the outside of my home? Regularly fix the edges of walkways and driveways and fix any cracks. Remove anything that might make you trip as you walk through a door, such as a raised step or threshold. Trim any bushes or trees on the path to your home. Use bright outdoor lighting. Clear any walking paths of anything that might make someone trip, such as rocks or tools. Regularly check to see if handrails are loose or broken. Make sure that both sides of any steps have handrails. Any raised decks and porches should have guardrails on the edges. Have any leaves, snow, or ice cleared regularly. Use sand or salt on walking paths during winter. Clean up any spills in your garage right away. This includes oil or grease spills. What can I do in the bathroom? Use night lights. Install grab bars by the toilet and in the tub and shower. Do not use towel bars as grab bars. Use non-skid mats or decals in the tub or shower. If you need to sit down in the shower, use a plastic, non-slip stool. Keep the floor dry. Clean up any water that spills on the floor as soon as it happens. Remove soap buildup in the tub or shower regularly. Attach bath mats securely with double-sided non-slip rug tape. Do not have throw rugs and other things on the floor that can make you trip. What can I do in the bedroom? Use night  lights. Make sure that you have a light by your bed that is easy to reach. Do not use any sheets or blankets that are too big for your bed. They should not hang down onto the floor. Have a firm chair that has side arms. You can use this for support while you get dressed. Do not have throw rugs and other things on the floor that can make you trip. What can I do in the kitchen? Clean up any spills right away. Avoid walking on wet floors. Keep items that you use a lot in easy-to-reach places. If you need to reach something above you, use a strong step stool that has a grab bar. Keep electrical cords out of the way. Do not use floor polish or wax that makes floors slippery. If you must use wax, use non-skid floor wax. Do not have throw rugs and other things on the floor that can make you trip. What can I do with my stairs? Do not leave any items on the stairs. Make sure that there are handrails on both sides of the stairs and use them. Fix handrails that are broken or loose. Make sure that handrails are as long as the stairways. Check any carpeting to make sure that it is firmly attached to the stairs. Fix any carpet that is loose or worn. Avoid having throw rugs at the top or bottom of the stairs. If you do  have throw rugs, attach them to the floor with carpet tape. Make sure that you have a light switch at the top of the stairs and the bottom of the stairs. If you do not have them, ask someone to add them for you. What else can I do to help prevent falls? Wear shoes that: Do not have high heels. Have rubber bottoms. Are comfortable and fit you well. Are closed at the toe. Do not wear sandals. If you use a stepladder: Make sure that it is fully opened. Do not climb a closed stepladder. Make sure that both sides of the stepladder are locked into place. Ask someone to hold it for you, if possible. Clearly mark and make sure that you can see: Any grab bars or handrails. First and last  steps. Where the edge of each step is. Use tools that help you move around (mobility aids) if they are needed. These include: Canes. Walkers. Scooters. Crutches. Turn on the lights when you go into a dark area. Replace any light bulbs as soon as they burn out. Set up your furniture so you have a clear path. Avoid moving your furniture around. If any of your floors are uneven, fix them. If there are any pets around you, be aware of where they are. Review your medicines with your doctor. Some medicines can make you feel dizzy. This can increase your chance of falling. Ask your doctor what other things that you can do to help prevent falls. This information is not intended to replace advice given to you by your health care provider. Make sure you discuss any questions you have with your health care provider. Document Released: 07/15/2009 Document Revised: 02/24/2016 Document Reviewed: 10/23/2014 Elsevier Interactive Patient Education  2017 [employer]

## 2022-04-12 NOTE — Progress Notes (Signed)
Virtual Visit via Telephone Note  I connected with  Kristen Kirk on 04/12/22 at 10:30 AM EDT by telephone and verified that I am speaking with the correct person using two identifiers.  Location: Patient: home Provider: [place] Persons participating in the virtual visit: [place]   I discussed the limitations, risks, security and privacy concerns of performing an evaluation and management service by telephone and the availability of in person appointments. The patient expressed understanding and agreed to proceed.  Interactive audio and video telecommunications were attempted between this nurse and patient, however failed, due to patient having technical difficulties OR patient did not have access to video capability.  We continued and completed visit with audio only.  Some vital signs may be absent or patient reported.   Dionisio David, LPN  Subjective:   Kristen Kirk is a 71 y.o. female who presents for Medicare Annual (Subsequent) preventive examination.  Review of Systems           Objective:    There were no vitals filed for this visit. There is no height or weight on file to calculate BMI.     03/25/2022    1:47 AM 03/24/2022   11:29 AM 03/25/2019    9:46 AM 12/07/2015    5:12 AM 12/07/2015   12:03 AM 11/05/2014    9:58 AM  Advanced Directives  Does Patient Have a Medical Advance Directive? No No No No No No  Would patient like information on creating a medical advance directive?   No - Patient declined No - patient declined information No - patient declined information No - patient declined information    Current Medications (verified) Outpatient Encounter Medications as of 04/12/2022  Medication Sig   alendronate (FOSAMAX) 70 MG tablet Take 70 mg by mouth once a week. Take with a full glass of water on an empty stomach.   apraclonidine (IOPIDINE) 0.5 % ophthalmic solution Place 1 drop into the left eye 2 (two) times daily.   aspirin  81 MG tablet Take 81 mg by mouth daily.   Blood Glucose Monitoring Suppl (ONE TOUCH ULTRA SYSTEM KIT) W/DEVICE KIT 1 kit by Does not apply route once.   carvedilol (COREG) 25 MG tablet Take 1.5 tablets (37.5 mg total) by mouth 2 (two) times daily with a meal.   Cholecalciferol (VITAMIN D3) 1000 units CAPS Take 2 capsules (2,000 Units total) by mouth daily.   docusate sodium (COLACE) 100 MG capsule Take 100 mg by mouth 2 (two) times daily as needed.    dorzolamide-timolol (COSOPT) 22.3-6.8 MG/ML ophthalmic solution Place 1 drop into both eyes 3 (three) times daily.   famotidine (PEPCID) 20 MG tablet Take 1 tablet (20 mg total) by mouth 2 (two) times daily.   glucose blood test strip Check sugars once daily   hydrochlorothiazide (HYDRODIURIL) 12.5 MG tablet Take 1 tablet (12.5 mg total) by mouth daily.   Melatonin 3 MG CAPS Take 1-2 capsules (3-6 mg total) by mouth at bedtime as needed (sleep).   mirabegron ER (MYRBETRIQ) 25 MG TB24 tablet Take 25 mg by mouth daily.   Multiple Vitamin (MULTIVITAMIN) tablet Take 1 tablet by mouth daily.   mycophenolate (MYFORTIC) 180 MG EC tablet Take 2 tablets (360 mg total) by mouth 2 (two) times daily.   polyethylene glycol powder (GLYCOLAX/MIRALAX) 17 GM/SCOOP powder Take 8.5 g by mouth daily. Hold for diarrhea   pravastatin (PRAVACHOL) 80 MG tablet TAKE 1 TABLET BY MOUTH  EVERY EVENING  prednisoLONE acetate (PRED FORTE) 1 % ophthalmic suspension Place 1 drop into the left eye 4 (four) times daily.   tacrolimus (PROGRAF) 0.5 MG capsule 2 tabs in am and 2 at night   travoprost, benzalkonium, (TRAVATAN) 0.004 % ophthalmic solution Place 1 drop into both eyes at bedtime.   [DISCONTINUED] pantoprazole (PROTONIX) 20 MG tablet TAKE 1 TABLET BY MOUTH EVERY DAY   No facility-administered encounter medications on file as of 04/12/2022.    Allergies (verified) Brimonidine, Shellfish-derived products, and Tramadol   History: Past Medical History:  Diagnosis Date    Anemia in chronic kidney disease    a. s/p aranesp 11/2013 now starting epo with HD 06/2014   Diabetes mellitus type II    a. Currently diet controlled since losing weight.   DJD (degenerative joint disease)    ESRD (end stage renal disease) ([place])    a. Followed @ [facility];  b. On dialysis since late 2015 (MWF).   ESRD (end stage renal disease) on dialysis [facility])    stage 5 per [facility] renal (Dr. Daphane Shepherd) considering transplant Access: LUE fistula Establishing with [facility]    Essential hypertension    GERD (gastroesophageal reflux disease)    Glaucoma    a. L>R   History of echocardiogram    a. 01/2015 Echo [facility]): EF ?55%, triv MR, mildly dil LA, nl PV.   History of stress test    a. 03/2013 Myoview [facility]): EF 63%, no ischemia.   History of Urge Incontinence    HLD (hyperlipidemia)    Osteoarthritis    Osteoporosis 05/03/2019   DEXA 04/2019 - 1.5 spine, -2.1 hip, -2.5 forearm Check phosphate and PTH to guide further management in h/o kidney transplant   Syncope    a. 12/2015.   Vaginal atrophy    a. vagifem caused spotting, normal TVS   Vitamin D deficiency    Past Surgical History:  Procedure Laterality Date   ANAL RECTAL MANOMETRY N/A 03/16/2021   Procedure: ANO RECTAL MANOMETRY;  Surgeon: Mauri Pole, MD;  Location: [facility];  Service: Endoscopy;  Laterality: N/A;   BLADDER SUSPENSION  2011   for stress incontinence-Dr. 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   Diabetes Mother    Heart failure Mother        died @ 36   Stroke Brother    Hyperlipidemia Brother    Hypertension Brother    Hyperlipidemia Brother    Hypertension Brother    Hyperlipidemia Sister    Hypertension Sister    Hyperlipidemia Sister    Hypertension Sister    Heart failure Maternal Grandfather    Breast cancer Neg  Hx    Colon cancer Neg Hx    Esophageal cancer Neg Hx    Stomach cancer Neg Hx    Rectal cancer Neg Hx    Social History   Socioeconomic History   Marital status: Married    Spouse name: Not on file   Number of children: 1   Years of education: Not on file   Highest education level: Not on file  Occupational History   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  Vaping Use   Vaping Use: Never used  Substance and Sexual Activity   Alcohol use: No    Alcohol/week: 0.0 standard drinks of alcohol   Drug use: No   Sexual activity: Yes    Partners: Male  Other Topics Concern   Not on file  Social History Narrative   Lives with husband in [place].   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   Clinical Intake:  Pre-visit  preparation completed: Yes  Pain : No/denies pain     Nutritional Risks: None Diabetes: No CBG done?: No Did pt. bring in CBG monitor from home?: No  How often do you need to have someone help you when you read instructions, pamphlets, or other written materials from your doctor or pharmacy?: 1 - Never  Diabetic?yes Nutrition Risk Assessment:  Has the patient had any N/V/D within the last 2 months?  No  Does the patient have any non-healing wounds?  No  Has the patient had any unintentional weight loss or weight gain?  No   Diabetes:  Is the patient diabetic?  Yes  If diabetic, was a CBG obtained today?  No  Did the patient bring in their glucometer from home?  Yes  How often do you monitor your CBG's? Every other day.   Financial Strains and Diabetes Management:  Are you having any financial strains with the device, your supplies or your medication? No .  Does the patient want to be seen by Chronic Care Management for management of their diabetes?  No  Would the patient like to be referred to a Nutritionist or for Diabetic Management?  No   Diabetic Exams:  Diabetic Eye Exam: Completed 02/23/14. Overdue for diabetic eye exam. Pt has been advised about the importance in completing this exam.   Diabetic Foot Exam: Completed 03/02/14. Pt has been advised about the importance in completing this exam.    Interpreter Needed?: No  Information entered by :: Kirke Shaggy, LPN   Activities of Daily Living     No data to display          Patient Care Team: Ria Bush, MD as PCP - General  Indicate any recent Medical Services you may have received from other than  [facility] providers in the past year (date may be approximate).     Assessment:   This is a routine wellness examination for Kristen Kirk.  Hearing/Vision screen - had eye exam 03/13/22 No results found.  Dietary issues and exercise activities discussed:     Goals Addressed   None    Depression Screen     09/29/2020    3:39 PM 03/20/2019   10:32 AM 03/02/2014   11:29 AM  PHQ 2/9 Scores  PHQ - 2 Score 0 0 0    Fall Risk    09/29/2020    3:39 PM 03/20/2019   10:31 AM 03/02/2014   11:29 AM  Fall Risk   Falls in the past year? 0 0 No    FALL RISK PREVENTION PERTAINING TO THE HOME:  Any stairs in or around the home? Yes  If so, are there any without handrails? No  Home free of loose throw rugs in walkways, pet beds, electrical cords, etc? Yes  Adequate lighting in your home to reduce risk of falls? Yes   ASSISTIVE DEVICES UTILIZED TO PREVENT FALLS:  Life alert? No  Use of a cane, walker or w/c? No  Grab bars in the bathroom? No  Shower chair or bench in shower? No  Elevated toilet seat or a handicapped toilet? No    Cognitive Function:declined to test 2023        Immunizations Immunization History  Administered Date(s) Administered   Fluad Quad(high Dose 65+) 07/07/2020   Influenza Whole 06/16/2010, 07/15/2013   Influenza, High Dose Seasonal PF 06/28/2019, 07/19/2021   Influenza,inj,Quad PF,6+ Mos 07/02/2018   Influenza-Unspecified 07/02/2014, 07/03/2015, 10/06/2016   PFIZER(Purple Top)SARS-COV-2 Vaccination 11/14/2019, 12/05/2019, 06/14/2020   PPD Test 03/19/2012, 04/09/2012, 07/09/2013   Pneumococcal Conjugate-13 03/20/2019   Pneumococcal Polysaccharide-23 01/29/2012, 09/29/2020   Td 10/30/2007   Zoster Recombinat (Shingrix) 07/03/2018, 10/28/2020    TDAP status: Due, Education has been provided regarding the importance of this vaccine. Advised may receive this vaccine at local pharmacy or Health Dept. Aware to provide a copy of the vaccination record if obtained from local pharmacy or Health Dept. Verbalized acceptance and understanding.  Flu Vaccine status: Up to date  Pneumococcal vaccine status: Up to date  Covid-19 vaccine status: Completed vaccines  Qualifies for Shingles Vaccine? Yes   Zostavax completed No   Shingrix Completed?: Yes  Screening  Tests Health Maintenance  Topic Date Due   OPHTHALMOLOGY EXAM  02/24/2015   FOOT EXAM  03/03/2015   TETANUS/TDAP  10/29/2017   COVID-19 Vaccine (4 - Booster for Pfizer series) 08/09/2020   URINE MICROALBUMIN  09/23/2021   INFLUENZA VACCINE  05/02/2022   HEMOGLOBIN A1C  10/08/2022   MAMMOGRAM  01/17/2023   COLONOSCOPY (Pts 45-34yr Insurance coverage will need to be confirmed)  03/24/2029   Pneumonia Vaccine 70 Years old  Completed   DEXA SCAN  Completed   Hepatitis C Screening  Completed   Zoster Vaccines- Shingrix  Completed   HPV VACCINES  Aged Out    Health Maintenance  Health Maintenance Due  Topic Date Due   OPHTHALMOLOGY EXAM  02/24/2015   FOOT EXAM  03/03/2015   TETANUS/TDAP  10/29/2017   COVID-19 Vaccine (4 - Booster for Pfizer series) 08/09/2020   URINE MICROALBUMIN  09/23/2021    Colorectal cancer screening: Type of screening: Colonoscopy. Completed 03/25/19. Repeat every 10 years  Mammogram status: Completed 02/23/22. Repeat every year  Bone Density status: Completed 05/01/19. Results reflect: Bone density results: OSTEOPOROSIS. Repeat every 2  years.- referral sent  Lung Cancer Screening: (Low Dose CT Chest recommended if Age 28-80 years, 30 pack-year currently smoking OR have quit w/in 15years.) does not qualify.   Additional Screening:  Hepatitis C Screening: does qualify; Completed 03/28/16  Vision Screening: Recommended annual ophthalmology exams for early detection of glaucoma and other disorders of the eye. Is the patient up to date with their annual eye exam?  Yes  Who is the provider or what is the name of the office in which the patient attends annual eye exams? Dr[facility] Haywood Lasso at [facility] If pt is not established with a provider, would they like to be referred to a provider to establish care? No .   Dental Screening: Recommended annual dental exams for proper oral hygiene  Community Resource Referral / Chronic Care Management: CRR required this visit?  No    CCM required this visit?  No      Plan:     I have personally reviewed and noted the following in the patient's chart:   Medical and social history Use of alcohol, tobacco or illicit drugs  Current medications and supplements including opioid prescriptions.  Functional ability and status Nutritional status Physical activity Advanced directives List of other physicians Hospitalizations, surgeries, and ER visits in previous 12 months Vitals Screenings to include cognitive, depression, and falls Referrals and appointments  In addition, I have reviewed and discussed with patient certain preventive protocols, quality metrics, and best practice recommendations. A written personalized care plan for preventive services as well as general preventive health recommendations were provided to patient.     Dionisio David, LPN   [id number]   Nurse Notes: none

## 2022-04-13 DIAGNOSIS — Z940 Kidney transplant status: Secondary | ICD-10-CM | POA: Diagnosis not present

## 2022-04-13 DIAGNOSIS — R7309 Other abnormal glucose: Secondary | ICD-10-CM | POA: Diagnosis not present

## 2022-04-13 DIAGNOSIS — N186 End stage renal disease: Secondary | ICD-10-CM | POA: Diagnosis not present

## 2022-04-13 DIAGNOSIS — Z79899 Other long term (current) drug therapy: Secondary | ICD-10-CM | POA: Diagnosis not present

## 2022-04-13 DIAGNOSIS — R0609 Other forms of dyspnea: Secondary | ICD-10-CM | POA: Diagnosis not present

## 2022-04-14 ENCOUNTER — Encounter: Payer: Self-pay | Admitting: Family Medicine

## 2022-04-14 ENCOUNTER — Ambulatory Visit (INDEPENDENT_AMBULATORY_CARE_PROVIDER_SITE_OTHER): Payer: Medicare Other | Admitting: Family Medicine

## 2022-04-14 VITALS — BP 118/70 | HR 68 | Temp 97.6°F | Ht 61.0 in | Wt 139.4 lb

## 2022-04-14 DIAGNOSIS — Z940 Kidney transplant status: Secondary | ICD-10-CM

## 2022-04-14 DIAGNOSIS — H401133 Primary open-angle glaucoma, bilateral, severe stage: Secondary | ICD-10-CM

## 2022-04-14 DIAGNOSIS — Z0000 Encounter for general adult medical examination without abnormal findings: Secondary | ICD-10-CM | POA: Diagnosis not present

## 2022-04-14 DIAGNOSIS — K219 Gastro-esophageal reflux disease without esophagitis: Secondary | ICD-10-CM

## 2022-04-14 DIAGNOSIS — R87610 Atypical squamous cells of undetermined significance on cytologic smear of cervix (ASC-US): Secondary | ICD-10-CM

## 2022-04-14 DIAGNOSIS — N3946 Mixed incontinence: Secondary | ICD-10-CM

## 2022-04-14 DIAGNOSIS — R159 Full incontinence of feces: Secondary | ICD-10-CM

## 2022-04-14 DIAGNOSIS — M2170 Unequal limb length (acquired), unspecified site: Secondary | ICD-10-CM

## 2022-04-14 DIAGNOSIS — E1169 Type 2 diabetes mellitus with other specified complication: Secondary | ICD-10-CM | POA: Diagnosis not present

## 2022-04-14 DIAGNOSIS — E559 Vitamin D deficiency, unspecified: Secondary | ICD-10-CM

## 2022-04-14 DIAGNOSIS — Z7189 Other specified counseling: Secondary | ICD-10-CM | POA: Diagnosis not present

## 2022-04-14 DIAGNOSIS — D849 Immunodeficiency, unspecified: Secondary | ICD-10-CM

## 2022-04-14 DIAGNOSIS — E785 Hyperlipidemia, unspecified: Secondary | ICD-10-CM

## 2022-04-14 DIAGNOSIS — R152 Fecal urgency: Secondary | ICD-10-CM

## 2022-04-14 DIAGNOSIS — M818 Other osteoporosis without current pathological fracture: Secondary | ICD-10-CM | POA: Diagnosis not present

## 2022-04-14 DIAGNOSIS — I10 Essential (primary) hypertension: Secondary | ICD-10-CM

## 2022-04-14 MED ORDER — ALENDRONATE SODIUM 70 MG PO TABS: 70.0000 mg | ORAL_TABLET | ORAL | 3 refills | Status: DC

## 2022-04-14 NOTE — Assessment & Plan Note (Signed)
Preventative protocols reviewed and updated unless pt declined. Discussed healthy diet and lifestyle.  

## 2022-04-14 NOTE — Progress Notes (Addendum)
Patient ID: Kristen Kirk, female    DOB: 1951/09/09, 71 y.o.   MRN: [id number]  This visit was conducted in person.  BP 118/70   Pulse 68   Temp 97.6 F (36.4 C) (Temporal)   Ht '5\' 1"'  (1.549 m)   Wt 139 lb 6 oz (63.2 kg)   LMP  (LMP Unknown)   SpO2 97%   BMI 26.33 kg/m    CC: CPE Subjective:   HPI: Kristen Kirk is a 71 y.o. female presenting on 04/14/2022 for Annual Exam (MCR prt 2. )    Saw health advisor this week for medicare wellness visit. Note reviewed. Declined cognitive assessment.   No results found.  Flowsheet Row Clinical Support from 04/12/2022 in [place] at [place]  PHQ-2 Total Score 0          04/12/2022   10:37 AM 09/29/2020    3:39 PM 03/20/2019   10:31 AM 03/02/2014   11:29 AM  Fall Risk   Falls in the past year? 0 0 0 No  Number falls in past yr: 0     Injury with Fall? 0     Risk for fall due to : No Fall Risks     Follow up Falls evaluation completed      Kidney transplant 04/2017. On prograf and mycophenolate. Goal BP <130/80. Saw nephrologist yesterday with good report.    Ongoing fecal incontinence - saw gen surgery Dr Marcello Moores - anal manometry showed decreased int sphincter function. Now taking fiber supplement. Considering sacral nerve stimulator placement.   Dehydrated a few weeks ago, seen at [facility] ER, treated with IVF and then rocephin then keflex course for possible UTI. Antibiotics may have caused R ankle pain. Saw [facility] - R PT tendinopathy.   Notes easy exertional fatigue without dyspnea or chest pain. To consider cardiovascular stress test.   Notes leg length discrepancy - saw Dr Lorelei Pont 01/2021 with 1cm lift for shoes as well as ortho felt - doesn't think this was too helpful. Notes this leads to unsteadiness and increases fall risk.   Preventative: COLONOSCOPY 03/2019 - diverticulosis, rpt 10 yrs (Nandigam) Pap normal 07/2011, 08/2014 WNL. Pap 03/2019 - ASCUS, HPV neg. Will repeat today.  Diagnostic mammogram  01/2022 Birads2 @ [place]  DEXA 04/2019 - 1.5 spine, -2.1 hip, -2.5 forearm - osteoporosis - weekly fosamax started by renal 2021 - continues this. Regular walking, vitamin D 2000IU daily. Discussed dietary calcium intake.  Lung cancer screen - quit smoking ~1987 Flu shot yearly.  COVD vaccine Pfizer 11/2019, 12/2019, booster 06/2020  Tetanus 2009.  Pneumovax 2013. Prevnar-13 2020 Shingrix - 07/2018, 10/2020 Advanced directive - does not have this yet. Packet provided today. HCPOA would be husband and sister. Full code, would be ok with feeding tube. Would not want prolonged life support if terminal condition.  Seat belt use discussed  Sunscreen use discussed. No changing moles on skin  Non smoker Alcohol - none Dentist q6 mo  Eye exam yearly - known severe bilateral POAG sees Dr Franchot Mimes at [facility].  Bowel - ongoing incontinence issue as per above. Wears pad regularly Bladder - chronic urge incontinence previously followed by uro s/p PTNS - continues myrbetriq with benefit during the day, worse symptoms at night time. Has tried multiple other treatments. 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 Interim medical history since our last visit reviewed. Allergies and medications reviewed and updated. Outpatient Medications Prior to Visit  Medication Sig Dispense Refill   amLODipine (NORVASC) 2.5 MG tablet Take 2.5 mg by mouth daily.     aspirin 81 MG tablet Take 81 mg by mouth daily.     Blood Glucose Monitoring Suppl (ONE TOUCH ULTRA SYSTEM KIT) W/DEVICE KIT 1 kit by Does not apply route once. 1 each 0   carvedilol (COREG) 25 MG tablet Take 12.5 mg by mouth 2 (two) times daily with a meal.      Cholecalciferol (VITAMIN D3) 1000 units CAPS Take 2 capsules (2,000 Units total) by mouth daily. 60 capsule    docusate sodium (COLACE) 100 MG capsule Take 100 mg by mouth 2 (two) times daily as needed for mild constipation or moderate constipation.  0   glucose blood test strip Check sugars once daily 100 each 11   hydrochlorothiazide (HYDRODIURIL) 12.5 MG tablet Take 1 tablet (12.5 mg total) by mouth daily. 90 tablet 3   Melatonin 3 MG CAPS Take 1-2 capsules (3-6 mg total) by mouth at bedtime as needed (sleep).  0   mirabegron ER (MYRBETRIQ) 25 MG TB24 tablet Take 25 mg by mouth daily.     Multiple Vitamin (MULTIVITAMIN) tablet Take 1 tablet by mouth daily.     pravastatin (PRAVACHOL) 80 MG tablet TAKE 1 TABLET BY MOUTH  EVERY EVENING (Patient taking differently: Take 80 mg by mouth daily.) 90 tablet 0   prednisoLONE acetate (PRED FORTE) 1 % ophthalmic suspension Place 1 drop into the left eye every 2 (two) hours while awake.     tacrolimus (PROGRAF) 0.5 MG capsule Take 0.5-1 mg by mouth See admin instructions. Take 2 capsules (77m) by mouth every morning and take 1 capsule (0.569m by mouth every night     alendronate (FOSAMAX) 70 MG tablet Take 70 mg by mouth once a week. Take with a full glass of water on an empty stomach.     apraclonidine (IOPIDINE) 0.5 % ophthalmic solution Place 1 drop into the left eye 2 (two) times daily. (Patient not taking: Reported on 07/09/2022)     dorzolamide-timolol (COSOPT) 22.3-6.8 MG/ML ophthalmic solution Place 1 drop into both eyes 3 (three) times daily.     famotidine (PEPCID) 20 MG tablet Take 1 tablet (20 mg total) by mouth 2 (two) times daily.     mycophenolate (MYFORTIC) 180 MG EC tablet Take 500 mg by mouth 2 (two) times daily. (Patient not taking: Reported on 07/09/2022)     pantoprazole (PROTONIX) 20 MG tablet TAKE 1 TABLET BY MOUTH EVERY DAY (Patient taking differently: Take 20 mg by mouth daily.) 90 tablet 1   polyethylene glycol powder  (GLYCOLAX/MIRALAX) 17 GM/SCOOP powder Take 8.5 g by mouth daily. Hold for diarrhea 3350 g 1   tiZANidine (ZANAFLEX) 2 MG tablet Take 1 tablet (2 mg total) by mouth 2 (two) times daily as needed for muscle spasms (sedation precautions). (Patient not taking: Reported on 07/09/2022) 30 tablet 0   travoprost, benzalkonium, (TRAVATAN) 0.004 % ophthalmic solution Place 1 drop into both eyes at bedtime. (Patient not taking: Reported on 07/09/2022)     No facility-administered medications prior to visit.     Per HPI unless specifically indicated in ROS section below Review of Systems  Constitutional:  Negative for activity change, appetite change,  chills, fatigue, fever and unexpected weight change.  HENT:  Negative for hearing loss.   Eyes:  Positive for visual disturbance (seeing eye doctor regularly).  Respiratory:  Positive for cough (occ). Negative for chest tightness, shortness of breath and wheezing.   Cardiovascular:  Negative for chest pain, palpitations and leg swelling.  Gastrointestinal:  Positive for constipation and diarrhea (occ). Negative for abdominal distention, abdominal pain, blood in stool, nausea and vomiting.  Genitourinary:  Negative for difficulty urinating and hematuria.  Musculoskeletal:  Negative for arthralgias, myalgias and neck pain.  Skin:  Negative for rash.  Neurological:  Negative for dizziness, seizures, syncope and headaches.  Hematological:  Negative for adenopathy. Does not bruise/bleed easily.  Psychiatric/Behavioral:  Negative for dysphoric mood. The patient is not nervous/anxious.     Objective:  BP 118/70   Pulse 68   Temp 97.6 F (36.4 C) (Temporal)   Ht '5\' 1"'  (1.549 m)   Wt 139 lb 6 oz (63.2 kg)   LMP  (LMP Unknown)   SpO2 97%   BMI 26.33 kg/m   Wt Readings from Last 3 Encounters:  07/17/22 134 lb 6 oz (61 kg)  07/08/22 130 lb (59 kg)  06/29/22 134 lb 11.2 oz (61.1 kg)      Physical Exam Vitals and nursing note reviewed.  Constitutional:       Appearance: Normal appearance. She is not ill-appearing.  HENT:     Head: Normocephalic and atraumatic.     Right Ear: Tympanic membrane, ear canal and external ear normal. There is no impacted cerumen.     Left Ear: Tympanic membrane, ear canal and external ear normal. There is no impacted cerumen.  Eyes:     General:        Right eye: No discharge.        Left eye: No discharge.     Extraocular Movements: Extraocular movements intact.     Conjunctiva/sclera: Conjunctivae normal.     Pupils: Pupils are equal, round, and reactive to light.  Neck:     Thyroid: No thyroid mass or thyromegaly.     Vascular: No carotid bruit.  Cardiovascular:     Rate and Rhythm: Normal rate and regular rhythm.     Pulses: Normal pulses.     Heart sounds: Normal heart sounds. No murmur heard. Pulmonary:     Effort: Pulmonary effort is normal. No respiratory distress.     Breath sounds: Normal breath sounds. No wheezing, rhonchi or rales.  Abdominal:     General: Bowel sounds are normal. There is no distension.     Palpations: Abdomen is soft. There is no mass.     Tenderness: There is no abdominal tenderness. There is no guarding or rebound.     Hernia: No hernia is present.  Musculoskeletal:     Cervical back: Normal range of motion and neck supple. No rigidity.     Right lower leg: No edema.     Left lower leg: No edema.  Lymphadenopathy:     Cervical: No cervical adenopathy.  Skin:    General: Skin is warm and dry.     Findings: No rash.  Neurological:     General: No focal deficit present.     Mental Status: She is alert. Mental status is at baseline.  Psychiatric:        Mood and Affect: Mood normal.        Behavior: Behavior normal.       Results for orders placed  or performed in visit on 04/07/22  Hemoglobin A1c  Result Value Ref Range   Hgb A1c MFr Bld 7.7 (H) 4.6 - 6.5 %  VITAMIN D 25 Hydroxy (Vit-D Deficiency, Fractures)  Result Value Ref Range   VITD 47.23 30.00 - 100.00  ng/mL  Lipid panel  Result Value Ref Range   Cholesterol 145 0 - 200 mg/dL   Triglycerides 100.0 0.0 - 149.0 mg/dL   HDL 42.10 >39.00 mg/dL   VLDL 20.0 0.0 - 40.0 mg/dL   LDL Cholesterol 83 0 - 99 mg/dL   Total CHOL/HDL Ratio 3    NonHDL 102.78   Comprehensive metabolic panel  Result Value Ref Range   Sodium 140 135 - 145 mEq/L   Potassium 3.9 3.5 - 5.1 mEq/L   Chloride 104 96 - 112 mEq/L   CO2 24 19 - 32 mEq/L   Glucose, Bld 136 (H) 70 - 99 mg/dL   BUN 33 (H) 6 - 23 mg/dL   Creatinine, Ser 1.03 0.40 - 1.20 mg/dL   Total Bilirubin 0.6 0.2 - 1.2 mg/dL   Alkaline Phosphatase 65 39 - 117 U/L   AST 13 0 - 37 U/L   ALT 12 0 - 35 U/L   Total Protein 7.1 6.0 - 8.3 g/dL   Albumin 4.3 3.5 - 5.2 g/dL   GFR 55.44 (L) >60.00 mL/min   Calcium 9.9 8.4 - 10.5 mg/dL    Assessment & Plan:   Problem List Items Addressed This Visit       Acute   Incontinence of feces    Now seeing gen surgery Dr Marcello Moores, on fiber supplement to bulk stool, considering sacral nerve stimulator implant.         Other   Advanced care planning/counseling discussion (Chronic)    Advanced directive - does not have this yet. Packet provided today. HCPOA would be husband and sister. Full code, would be ok with feeding tube. Would not want prolonged life support if terminal condition.       Health maintenance examination - Primary (Chronic)    Preventative protocols reviewed and updated unless pt declined. Discussed healthy diet and lifestyle.       Immunosuppressed status ([place]) (Chronic)    On prograf/cellcept after kidney transplant 2018.       Vitamin D deficiency    Continues vitamin D 2000 IU daily - levels well controlled.       Primary open angle glaucoma (POAG) of both eyes, severe stage    Regularly sees eye doctor.       Type 2 diabetes mellitus with other specified complication (HCC)    Deteriorated control based on recent A1c (7.7%) however A1c last week at kidney transplant clinic was  better (7%). No changes recommended, did discuss continued work towards diabetic diet. Reassess at 3 mo DM f/u.       Hyperlipidemia associated with type 2 diabetes mellitus (HCC)    Chronic, stable and well controlled on full dose pravastatin - continue.  The 10-year ASCVD risk score (Arnett DK, et al., 2019) is: 16.4%   Values used to calculate the score:     Age: 83 years     Sex: Female     Is Non-Hispanic African American: Yes     Diabetic: Yes     Tobacco smoker: No     Systolic Blood Pressure: 035 mmHg     Is BP treated: Yes     HDL Cholesterol: 42.1 mg/dL     Total  Cholesterol: 145 mg/dL       GERD (gastroesophageal reflux disease)    Stable period on pantoprazole 52m daily.       Mixed incontinence urge and stress    Continues myrbetriq.  Has seen urology, s/p PFPT.       History of renal transplant    Appreciate kidney transplant clinic care.       Osteoporosis    Fosamax started 2021.  [facility]       ASCUS of cervix with negative high risk HPV    Discussed final pap smear, forgot to perform - will need next visit.       Acquired leg length discrepancy    Ongoing difficulty. Saw sports medicine with 1.5cm measured difference, placed in shoe inserts/lifts with limited benefit.  Interested in further evaluation - will refer to ortho for eval/management.       Relevant Orders   Ambulatory referral to Orthopedic Surgery   Other Visit Diagnoses     Primary hypertension            Meds ordered this encounter  Medications   DISCONTD: alendronate (FOSAMAX) 70 MG tablet    Sig: Take 1 tablet (70 mg total) by mouth once a week. Take with a full glass of water on an empty stomach.    Dispense:  12 tablet    Refill:  3   Orders Placed This Encounter  Procedures   Ambulatory referral to Orthopedic Surgery    Referral Priority:   Routine    Referral Type:   Surgical    Referral Reason:    Specialty Services Required    Requested Specialty:   Orthopedic Surgery    Number of Visits Requested:   1    Patient instructions: [facility] to [phone] to see you today Sugar was too high - but it was better at kidney transplant clinic.  Return as needed or in 3 months for diabetes follow up visit - we will recheck A1c and determine need for further medicine. In the meantime, work on low sugar low carb diabetic diet. We will refer you to ortho for further evaluation of leg length issue.   Follow up plan: Return in about 3 months (around 07/15/2022), or if symptoms worsen or fail to improve, for follow up visit.  JRia Bush MD

## 2022-04-14 NOTE — Assessment & Plan Note (Signed)
Advanced directive - does not have this yet. Packet provided today. HCPOA would be husband and sister. Full code, would be ok with feeding tube. Would not want prolonged life support if terminal condition.

## 2022-04-14 NOTE — Patient Instructions (Addendum)
Call to schedule bone density scan at your convenience: [facility] at [facility] [phone] Good to see you today Sugar was too high - but it was better at kidney transplant clinic.  Return as needed or in 3 months for diabetes follow up visit - we will recheck A1c and determine need for further medicine. In the meantime, work on low sugar low carb diabetic diet.  We will refer you to ortho for further evaluation of leg length issue.   Health Maintenance After Age 4 After age 6, you are at a higher risk for certain long-term diseases and infections as well as injuries from falls. Falls are a major cause of broken bones and head injuries in people who are older than age 13. Getting regular preventive care can help to keep you healthy and well. Preventive care includes getting regular testing and making lifestyle changes as recommended by your health care provider. Talk with your health care provider about: Which screenings and tests you should have. A screening is a test that checks for a disease when you have no symptoms. A diet and exercise plan that is right for you. What should I know about screenings and tests to prevent falls? Screening and testing are the best ways to find a health problem early. Early diagnosis and treatment give you the best chance of managing medical conditions that are common after age 43. Certain conditions and lifestyle choices may make you more likely to have a fall. Your health care provider may recommend: Regular vision checks. Poor vision and conditions such as cataracts can make you more likely to have a fall. If you wear glasses, make sure to get your prescription updated if your vision changes. Medicine review. Work with your health care provider to regularly review all of the medicines you are taking, including over-the-counter medicines. Ask your health care provider about any side effects that may make you more likely to have a fall. Tell your health  care provider if any medicines that you take make you feel dizzy or sleepy. Strength and balance checks. Your health care provider may recommend certain tests to check your strength and balance while standing, walking, or changing positions. Foot health exam. Foot pain and numbness, as well as not wearing proper footwear, can make you more likely to have a fall. Screenings, including: Osteoporosis screening. Osteoporosis is a condition that causes the bones to get weaker and break more easily. Blood pressure screening. Blood pressure changes and medicines to control blood pressure can make you feel dizzy. Depression screening. You may be more likely to have a fall if you have a fear of falling, feel depressed, or feel unable to do activities that you used to do. Alcohol use screening. Using too much alcohol can affect your balance and may make you more likely to have a fall. Follow these instructions at home: Lifestyle Do not drink alcohol if: Your health care provider tells you not to drink. If you drink alcohol: Limit how much you have to: 0-1 drink a day for women. 0-2 drinks a day for men. Know how much alcohol is in your drink. In the [place] one drink equals one 12 oz bottle of beer (355 mL), one 5 oz glass of wine (148 mL), or one 1 oz glass of hard liquor (44 mL). Do not use any products that contain nicotine or tobacco. These products include cigarettes, chewing tobacco, and vaping devices, such as e-cigarettes. If you need help quitting, ask your health  care provider. Activity  Follow a regular exercise program to stay fit. This will help you maintain your balance. Ask your health care provider what types of exercise are appropriate for you. If you need a cane or walker, use it as recommended by your health care provider. Wear supportive shoes that have nonskid soles. Safety  Remove any tripping hazards, such as rugs, cords, and clutter. Install safety equipment such as grab bars  in bathrooms and safety rails on stairs. Keep rooms and walkways well-lit. General instructions Talk with your health care provider about your risks for falling. Tell your health care provider if: You fall. Be sure to tell your health care provider about all falls, even ones that seem minor. You feel dizzy, tiredness (fatigue), or off-balance. Take over-the-counter and prescription medicines only as told by your health care provider. These include supplements. Eat a healthy diet and maintain a healthy weight. A healthy diet includes low-fat dairy products, low-fat ([place]) meats, and fiber from whole grains, beans, and lots of fruits and vegetables. Stay current with your vaccines. Schedule regular health, dental, and eye exams. Summary Having a healthy lifestyle and getting preventive care can help to protect your health and wellness after age 72. Screening and testing are the best way to find a health problem early and help you avoid having a fall. Early diagnosis and treatment give you the best chance for managing medical conditions that are more common for people who are older than age 29. Falls are a major cause of broken bones and head injuries in people who are older than age 85. Take precautions to prevent a fall at home. Work with your health care provider to learn what changes you can make to improve your health and wellness and to prevent falls. This information is not intended to replace advice given to you by your health care provider. Make sure you discuss any questions you have with your health care provider. Document Revised: 02/07/2021 Document Reviewed: 02/07/2021 Elsevier Patient Education  [place].

## 2022-04-14 NOTE — Assessment & Plan Note (Signed)
Appreciate kidney transplant clinic care.

## 2022-04-15 ENCOUNTER — Encounter: Payer: Self-pay | Admitting: Family Medicine

## 2022-04-15 DIAGNOSIS — D849 Immunodeficiency, unspecified: Secondary | ICD-10-CM | POA: Insufficient documentation

## 2022-04-15 NOTE — Assessment & Plan Note (Signed)
On prograf/cellcept after kidney transplant 2018.

## 2022-04-15 NOTE — Assessment & Plan Note (Signed)
Chronic, stable on amlodipine, carvedilol, hctz daily.

## 2022-04-15 NOTE — Assessment & Plan Note (Signed)
Now seeing gen surgery Dr Marcello Moores, on fiber supplement to bulk stool, considering sacral nerve stimulator implant.

## 2022-04-15 NOTE — Assessment & Plan Note (Signed)
Regularly sees eye doctor.  

## 2022-04-15 NOTE — Assessment & Plan Note (Signed)
Ongoing difficulty. Saw sports medicine with 1.5cm measured difference, placed in shoe inserts/lifts with limited benefit.  Interested in further evaluation - will refer to ortho for eval/management.

## 2022-04-15 NOTE — Assessment & Plan Note (Signed)
Chronic, stable and well controlled on full dose pravastatin - continue.  The 10-year ASCVD risk score (Arnett DK, et al., 2019) is: 16.4%   Values used to calculate the score:     Age: 71 years     Sex: Female     Is Non-Hispanic African American: Yes     Diabetic: Yes     Tobacco smoker: No     Systolic Blood Pressure: 476 mmHg     Is BP treated: Yes     HDL Cholesterol: 42.1 mg/dL     Total Cholesterol: 145 mg/dL

## 2022-04-15 NOTE — Assessment & Plan Note (Signed)
Continues vitamin D 2000 IU daily - levels well controlled.

## 2022-04-15 NOTE — Assessment & Plan Note (Signed)
Continues myrbetriq.  Has seen urology, s/p PFPT.

## 2022-04-15 NOTE — Assessment & Plan Note (Signed)
Fosamax started 2021.  Due for rpt DEXA - # provided to schedule appt at [facility].

## 2022-04-15 NOTE — Assessment & Plan Note (Signed)
Stable period on pantoprazole 20mg  daily.

## 2022-04-15 NOTE — Assessment & Plan Note (Signed)
Discussed final pap smear, forgot to perform - will need next visit.

## 2022-04-15 NOTE — Assessment & Plan Note (Addendum)
Deteriorated control based on recent A1c (7.7%) however A1c last week at kidney transplant clinic was better (7%). No changes recommended, did discuss continued work towards diabetic diet. Reassess at 3 mo DM f/u.

## 2022-05-29 ENCOUNTER — Telehealth: Payer: Self-pay | Admitting: Family Medicine

## 2022-05-29 DIAGNOSIS — R152 Fecal urgency: Secondary | ICD-10-CM

## 2022-05-29 NOTE — Telephone Encounter (Signed)
Spoke with pt asking if she was referred to anyone else.  States she received a letter on 05/27/22 informing her about Dr. Marcello Moores no longer accepts [employer] and pt would need to see PCP about getting pt set up another surgeon.  Pt states she really needs the rectal surgery as soon as possible.

## 2022-05-29 NOTE — Telephone Encounter (Signed)
Patient called in stating that Dr. Leighton Ruff no longer accepts [employer]. She was scheduled for 06/16/22 for a surgery but she will no longer be able to do it because of this reason. Patient was wondering if Dr. Darnell Level. Could recommend someone else. She could be reached at [phone]. Thank you!

## 2022-05-29 NOTE — Telephone Encounter (Signed)
I'm sorry to hear this.  Can we see if  Surgical does fecal incontinence and sacral nerve stimulator placement? If not, will need to refer to [facility] or [place] to [employer].

## 2022-05-31 ENCOUNTER — Encounter (HOSPITAL_BASED_OUTPATIENT_CLINIC_OR_DEPARTMENT_OTHER): Payer: Self-pay | Admitting: General Surgery

## 2022-05-31 ENCOUNTER — Other Ambulatory Visit: Payer: Self-pay

## 2022-05-31 DIAGNOSIS — Z940 Kidney transplant status: Secondary | ICD-10-CM | POA: Diagnosis not present

## 2022-05-31 NOTE — Progress Notes (Signed)
Spoke w/ via phone for pre-op interview---pt  Lab needs dos----EKG, I-stat               Lab results------none COVID test -----patient states asymptomatic no test needed Arrive at -------0800 NPO after MN NO Solid Food.  Clear liquids from MN until---0700 Med rec completed Medications to take morning of surgery -----Myfortic, Prograf, Coreg, Amolodine, Alendronate, protonix, mybretriq Diabetic medication -----n/a Patient instructed no nail polish to be worn day of surgery Patient instructed to bring photo id and insurance card day of surgery Patient aware to have Driver (ride ) / caregiver  husband Kristen Kirk   for 24 hours after surgery  Patient Special Instructions ----- Pre-Op special Istructions -----hold hydrochlorothiazide, and asopirin 81 mg DOS Patient verbalized understanding of instructions that were given at this phone interview. Patient denies shortness of breath, chest pain, fever, cough at this phone interview.   Had called pt earlier and pt was told by letter from Dr. Marcello Moores office surgery would not be covered her insurance.   Spoke with Kristen Kirk from Dr. Marcello Moores' office and  Kristen Kirk spoke with billing department and pt is covered by insurance to have procedure with Dr. Marcello Moores.   If pt has anymore questions about insurance coverage she was instructed to call dr. Manon Hilding office and speak to someone in billing department.

## 2022-06-01 DIAGNOSIS — R0609 Other forms of dyspnea: Secondary | ICD-10-CM | POA: Diagnosis not present

## 2022-06-01 NOTE — Progress Notes (Signed)
Called pt to let her know that her surgery time has changed that she needs to arrive at [facility] at 0730 am on September 15.  She can have clear liquids till 0630 that morning.

## 2022-06-08 ENCOUNTER — Encounter (HOSPITAL_BASED_OUTPATIENT_CLINIC_OR_DEPARTMENT_OTHER): Payer: Self-pay | Admitting: General Surgery

## 2022-06-12 NOTE — Progress Notes (Signed)
Pt called inquiring about arrival time and her daughter had questions about the procedure.  Pt verbalized understanding to arrive at 0630 and npo after mn with exception clear liquids until 0530.  Suggested that pt call Dr Marcello Moores office and ask for triage nurse and the can send message to Dr Marcello Moores about possibly calling her daughter to answer her questions.

## 2022-06-13 NOTE — Telephone Encounter (Addendum)
Please place a new referral. Previous referral is from 09/2021.   Once new referral is in we can reach out to [place] for your request  Thanks!

## 2022-06-14 NOTE — Addendum Note (Signed)
Addended by: Ria Bush on: 06/14/2022 07:54 AM   Modules accepted: Orders

## 2022-06-14 NOTE — Telephone Encounter (Signed)
New referral placed.

## 2022-06-15 DIAGNOSIS — H43813 Vitreous degeneration, bilateral: Secondary | ICD-10-CM | POA: Diagnosis not present

## 2022-06-15 DIAGNOSIS — H401133 Primary open-angle glaucoma, bilateral, severe stage: Secondary | ICD-10-CM | POA: Diagnosis not present

## 2022-06-15 DIAGNOSIS — H02401 Unspecified ptosis of right eyelid: Secondary | ICD-10-CM | POA: Diagnosis not present

## 2022-06-15 DIAGNOSIS — H18413 Arcus senilis, bilateral: Secondary | ICD-10-CM | POA: Diagnosis not present

## 2022-06-15 NOTE — Progress Notes (Signed)
Called patient time change to be her at 0730 instead of 0630. Clear liquids until 0630

## 2022-06-16 ENCOUNTER — Other Ambulatory Visit: Payer: Self-pay

## 2022-06-16 ENCOUNTER — Ambulatory Visit (HOSPITAL_COMMUNITY): Payer: Medicare Other

## 2022-06-16 ENCOUNTER — Encounter (HOSPITAL_BASED_OUTPATIENT_CLINIC_OR_DEPARTMENT_OTHER)
Admission: RE | Disposition: A | Discharge status: Home / Self Care | Payer: Self-pay | Source: Home / Self Care | Attending: General Surgery

## 2022-06-16 ENCOUNTER — Ambulatory Visit (HOSPITAL_BASED_OUTPATIENT_CLINIC_OR_DEPARTMENT_OTHER)
Admission: RE | Admit: 2022-06-16 | Discharge: 2022-06-16 | Disposition: A | Discharge status: Home / Self Care | Payer: Medicare Other | Attending: General Surgery | Admitting: General Surgery

## 2022-06-16 ENCOUNTER — Encounter (HOSPITAL_BASED_OUTPATIENT_CLINIC_OR_DEPARTMENT_OTHER): Payer: Self-pay | Admitting: General Surgery

## 2022-06-16 ENCOUNTER — Ambulatory Visit (HOSPITAL_BASED_OUTPATIENT_CLINIC_OR_DEPARTMENT_OTHER): Payer: Medicare Other | Admitting: Anesthesiology

## 2022-06-16 DIAGNOSIS — R159 Full incontinence of feces: Secondary | ICD-10-CM | POA: Diagnosis not present

## 2022-06-16 DIAGNOSIS — I10 Essential (primary) hypertension: Secondary | ICD-10-CM | POA: Insufficient documentation

## 2022-06-16 DIAGNOSIS — Z87891 Personal history of nicotine dependence: Secondary | ICD-10-CM | POA: Insufficient documentation

## 2022-06-16 DIAGNOSIS — E119 Type 2 diabetes mellitus without complications: Secondary | ICD-10-CM | POA: Insufficient documentation

## 2022-06-16 DIAGNOSIS — Z940 Kidney transplant status: Secondary | ICD-10-CM | POA: Insufficient documentation

## 2022-06-16 DIAGNOSIS — E1169 Type 2 diabetes mellitus with other specified complication: Secondary | ICD-10-CM

## 2022-06-16 HISTORY — DX: Presence of spectacles and contact lenses: Z97.3

## 2022-06-16 HISTORY — DX: Presence of dental prosthetic device (complete) (partial): Z97.2

## 2022-06-16 LAB — POCT I-STAT, CHEM 8
BUN: 27 mg/dL — ABNORMAL HIGH (ref 8–23)
Calcium, Ion: 1.2 mmol/L (ref 1.15–1.40)
Chloride: 109 mmol/L (ref 98–111)
Creatinine, Ser: 1.1 mg/dL — ABNORMAL HIGH (ref 0.44–1.00)
Glucose, Bld: 141 mg/dL — ABNORMAL HIGH (ref 70–99)
HCT: 42 % (ref 36.0–46.0)
Hemoglobin: 14.3 g/dL (ref 12.0–15.0)
Potassium: 3.6 mmol/L (ref 3.5–5.1)
Sodium: 141 mmol/L (ref 135–145)
TCO2: 22 mmol/L (ref 22–32)

## 2022-06-16 SURGERY — INSERTION, NEUROSTIMULATOR, SACRAL
Anesthesia: Monitor Anesthesia Care

## 2022-06-16 MED ORDER — CEFAZOLIN SODIUM-DEXTROSE 2-4 GM/100ML-% IV SOLN
INTRAVENOUS | Status: AC
  Filled 2022-06-16: qty 100

## 2022-06-16 MED ORDER — PROPOFOL 500 MG/50ML IV EMUL
INTRAVENOUS | Status: DC | PRN
  Administered 2022-06-16: 200 ug/kg/min via INTRAVENOUS

## 2022-06-16 MED ORDER — FENTANYL CITRATE (PF) 100 MCG/2ML IJ SOLN: 25.0000 ug | INTRAMUSCULAR | Status: DC | PRN

## 2022-06-16 MED ORDER — CEFAZOLIN SODIUM-DEXTROSE 2-4 GM/100ML-% IV SOLN
2.0000 g | INTRAVENOUS | Status: AC
  Administered 2022-06-16: 2 g via INTRAVENOUS

## 2022-06-16 MED ORDER — MIDAZOLAM HCL 5 MG/5ML IJ SOLN
INTRAMUSCULAR | Status: DC | PRN
  Administered 2022-06-16: 2 mg via INTRAVENOUS

## 2022-06-16 MED ORDER — OXYCODONE HCL 5 MG PO TABS: 5.0000 mg | ORAL_TABLET | ORAL | Status: DC | PRN

## 2022-06-16 MED ORDER — BUPIVACAINE-EPINEPHRINE 0.5% -1:200000 IJ SOLN
INTRAMUSCULAR | Status: DC | PRN
  Administered 2022-06-16: 30 mL

## 2022-06-16 MED ORDER — PROPOFOL 10 MG/ML IV BOLUS
INTRAVENOUS | Status: DC | PRN
  Administered 2022-06-16 (×3): 20 mg via INTRAVENOUS

## 2022-06-16 MED ORDER — FENTANYL CITRATE (PF) 100 MCG/2ML IJ SOLN
INTRAMUSCULAR | Status: DC | PRN
  Administered 2022-06-16: 25 ug via INTRAVENOUS
  Administered 2022-06-16: 50 ug via INTRAVENOUS
  Administered 2022-06-16: 25 ug via INTRAVENOUS

## 2022-06-16 MED ORDER — PHENYLEPHRINE 80 MCG/ML (10ML) SYRINGE FOR IV PUSH (FOR BLOOD PRESSURE SUPPORT)
PREFILLED_SYRINGE | INTRAVENOUS | Status: AC
  Filled 2022-06-16: qty 10

## 2022-06-16 MED ORDER — OXYCODONE HCL 5 MG PO TABS: 5.0000 mg | ORAL_TABLET | Freq: Once | ORAL | Status: DC | PRN

## 2022-06-16 MED ORDER — GLYCOPYRROLATE PF 0.2 MG/ML IJ SOSY
PREFILLED_SYRINGE | INTRAMUSCULAR | Status: DC | PRN
  Administered 2022-06-16: .2 mg via INTRAVENOUS

## 2022-06-16 MED ORDER — SODIUM CHLORIDE 0.9 % IV SOLN: 250.0000 mL | INTRAVENOUS | Status: DC | PRN

## 2022-06-16 MED ORDER — PROPOFOL 1000 MG/100ML IV EMUL
INTRAVENOUS | Status: AC
  Filled 2022-06-16: qty 100

## 2022-06-16 MED ORDER — ACETAMINOPHEN 325 MG RE SUPP: 650.0000 mg | RECTAL | Status: DC | PRN

## 2022-06-16 MED ORDER — FENTANYL CITRATE (PF) 100 MCG/2ML IJ SOLN
INTRAMUSCULAR | Status: AC
  Filled 2022-06-16: qty 2

## 2022-06-16 MED ORDER — HYDROCODONE-ACETAMINOPHEN 5-325 MG PO TABS: 1.0000 | ORAL_TABLET | Freq: Four times a day (QID) | ORAL | 0 refills | Status: DC | PRN

## 2022-06-16 MED ORDER — ACETAMINOPHEN 325 MG PO TABS: 650.0000 mg | ORAL_TABLET | ORAL | Status: DC | PRN

## 2022-06-16 MED ORDER — SODIUM CHLORIDE 0.9% FLUSH: 3.0000 mL | Freq: Two times a day (BID) | INTRAVENOUS | Status: DC

## 2022-06-16 MED ORDER — PHENYLEPHRINE 80 MCG/ML (10ML) SYRINGE FOR IV PUSH (FOR BLOOD PRESSURE SUPPORT)
PREFILLED_SYRINGE | INTRAVENOUS | Status: DC | PRN
  Administered 2022-06-16 (×2): 160 ug via INTRAVENOUS
  Administered 2022-06-16: 80 ug via INTRAVENOUS

## 2022-06-16 MED ORDER — ACETAMINOPHEN 500 MG PO TABS
ORAL_TABLET | ORAL | Status: AC
  Filled 2022-06-16: qty 2

## 2022-06-16 MED ORDER — SODIUM CHLORIDE 0.9% FLUSH: 3.0000 mL | INTRAVENOUS | Status: DC | PRN

## 2022-06-16 MED ORDER — ONDANSETRON HCL 4 MG/2ML IJ SOLN
INTRAMUSCULAR | Status: AC
  Filled 2022-06-16: qty 2

## 2022-06-16 MED ORDER — ONDANSETRON HCL 4 MG/2ML IJ SOLN
INTRAMUSCULAR | Status: DC | PRN
  Administered 2022-06-16: 4 mg via INTRAVENOUS

## 2022-06-16 MED ORDER — MIDAZOLAM HCL 2 MG/2ML IJ SOLN
INTRAMUSCULAR | Status: AC
  Filled 2022-06-16: qty 2

## 2022-06-16 MED ORDER — SODIUM CHLORIDE 0.9 % IV SOLN
INTRAVENOUS | Status: DC
  Administered 2022-06-16: 1000 mL via INTRAVENOUS

## 2022-06-16 MED ORDER — ONDANSETRON HCL 4 MG/2ML IJ SOLN: 4.0000 mg | Freq: Once | INTRAMUSCULAR | Status: DC | PRN

## 2022-06-16 MED ORDER — ACETAMINOPHEN 500 MG PO TABS
1000.0000 mg | ORAL_TABLET | ORAL | Status: AC
  Administered 2022-06-16: 1000 mg via ORAL

## 2022-06-16 MED ORDER — GLYCOPYRROLATE PF 0.2 MG/ML IJ SOSY
PREFILLED_SYRINGE | INTRAMUSCULAR | Status: AC
  Filled 2022-06-16: qty 1

## 2022-06-16 MED ORDER — PROPOFOL 500 MG/50ML IV EMUL
INTRAVENOUS | Status: AC
  Filled 2022-06-16: qty 50

## 2022-06-16 MED ORDER — OXYCODONE HCL 5 MG/5ML PO SOLN: 5.0000 mg | Freq: Once | ORAL | Status: DC | PRN

## 2022-06-16 SURGICAL SUPPLY — 39 items
ADH SKN CLS APL DERMABOND .7 (GAUZE/BANDAGES/DRESSINGS) ×1
APL PRP STRL LF DISP 70% ISPRP (MISCELLANEOUS) ×1
BLADE SURG 15 STRL LF DISP TIS (BLADE) ×1 IMPLANT
BLADE SURG 15 STRL SS (BLADE) ×1
CABLE EXTEN 4.32 INTERSTIM (NEUROSURGERY SUPPLIES) IMPLANT
CHLORAPREP W/TINT 26 (MISCELLANEOUS) ×1 IMPLANT
COVER BACK TABLE 60X90IN (DRAPES) ×1 IMPLANT
COVER MAYO STAND STRL (DRAPES) ×1 IMPLANT
DERMABOND IMPLANT
DERMABOND ADVANCED .7 DNX12 (GAUZE/BANDAGES/DRESSINGS) ×1 IMPLANT
DRAPE C-ARM 42X120 X-RAY (DRAPES) IMPLANT
DRAPE INCISE IOBAN 66X45 STRL (DRAPES) ×1 IMPLANT
DRAPE LAPAROSCOPIC ABDOMINAL (DRAPES) ×1 IMPLANT
DRAPE SHEET LG 3/4 BI-LAMINATE (DRAPES) IMPLANT
DRAPE UTILITY XL STRL (DRAPES) ×1 IMPLANT
DRSG TEGADERM 4X4.75 (GAUZE/BANDAGES/DRESSINGS) IMPLANT
ELECT REM PT RETURN 9FT ADLT (ELECTROSURGICAL) ×1
ELECTRODE REM PT RTRN 9FT ADLT (ELECTROSURGICAL) ×1 IMPLANT
GAUZE 4X4 16PLY ~~LOC~~+RFID DBL (SPONGE) ×1 IMPLANT
GAUZE SPONGE 4X4 12PLY STRL (GAUZE/BANDAGES/DRESSINGS) IMPLANT
GLOVE BIO SURGEON STRL SZ 6.5 (GLOVE) ×1 IMPLANT
GLOVE BIOGEL PI IND STRL 7.0 (GLOVE) ×1 IMPLANT
GLOVE INDICATOR 6.5 STRL GRN (GLOVE) ×1 IMPLANT
GOWN STRL REUS W/TWL XL LVL3 (GOWN DISPOSABLE) ×1 IMPLANT
KIT TURNOVER CYSTO (KITS) ×1 IMPLANT
LEAD INTERSTIM 4.32 28 L (Lead) IMPLANT
NEEDLE HYPO 22GX1.5 SAFETY (NEEDLE) ×1 IMPLANT
NEUROSTIMULATOR 1.7X2X.06 (UROLOGICAL SUPPLIES) IMPLANT
PACK BASIN DAY SURGERY FS (CUSTOM PROCEDURE TRAY) ×1 IMPLANT
PENCIL SMOKE EVACUATOR (MISCELLANEOUS) ×1 IMPLANT
STRIP CLOSURE SKIN 1/2X4 (GAUZE/BANDAGES/DRESSINGS) IMPLANT
SUT VIC AB 3-0 SH 27 (SUTURE) ×1
SUT VIC AB 3-0 SH 27X BRD (SUTURE) ×1 IMPLANT
SUT VIC AB 4-0 PS2 18 (SUTURE) ×1 IMPLANT
SYR BULB IRRIG 60ML STRL (SYRINGE) ×1 IMPLANT
SYR CONTROL 10ML LL (SYRINGE) ×1 IMPLANT
TOWEL OR 17X26 10 PK STRL BLUE (TOWEL DISPOSABLE) ×1 IMPLANT
TUBE CONNECTING 12X1/4 (SUCTIONS) IMPLANT
WATER STERILE IRR 500ML POUR (IV SOLUTION) ×1 IMPLANT

## 2022-06-16 NOTE — H&P (View-Only) (Signed)
MRN: [id number] DOB: [birth year] DATE OF ENCOUNTER: 04/03/2022   Subjective    Chief Complaint: No chief complaint on file.       History of Present Illness: Kristen Kirk is a 71 y.o. female who was seen recently as an office consultation for fecal incontinence.  She presented to the office with approximately 1 year of worsening incontinence.  She states that she has leakage on a pad several times a day.  This is worse with loose stools.  She has regular bowel function on most days.  She underwent anorectal manometry and was noted to have decreased resting tone but good squeeze pressure.  We switched her to fiber daily instead of miralax.  She is having formed stools but continues to have some leakage.   Review of Systems: A complete review of systems was obtained from the patient.  I have reviewed this information and discussed as appropriate with the patient.  See HPI as well for other ROS.     Medical History: Past Medical History         Past Medical History:  Diagnosis Date   Cataract cortical, senile     Claustrophobia      Mild per patient statement, O2 mask ok   Diabetes mellitus without complication (CMS-HCC)      Past hx, now no longer requiring medications   Glaucoma (increased eye pressure)      POAG OU   High cholesterol     Hypertension               Patient Active Problem List  Diagnosis   Hyperlipidemia   GERD (gastroesophageal reflux disease)   Chronic kidney disease   POAG (primary open-angle glaucoma)   Nuclear sclerotic cataract of both eyes - Both Eyes   Acquired leg length discrepancy   Advanced care planning/counseling discussion   Medicare annual wellness visit, subsequent   Alopecia   Arteriovenous fistula thrombosis (CMS-HCC)   ASCUS of cervix with negative high risk HPV   Chronic lower back pain   Blurry vision   Constipation due to outlet dysfunction   Incontinence of feces   Mixed incontinence urge and stress   Type II diabetes  mellitus with renal manifestations, uncontrolled   Disorder of rotator cuff syndrome of shoulder and allied disorder   Dizziness   Vertigo   Encopresis, nonorganic origin   Essential hypertension   Flatulence, eructation and gas pain   Glaucoma   Insomnia   Iron deficiency   Kidney transplant status   Left sided abdominal pain, unspecified   Memory impairment   Muscle cramps   Osteoporosis   Postmenopausal atrophic vaginitis   Rectal bleeding   Right shoulder pain   Syncope and collapse   Unspecified osteoarthritis, unspecified site   Thoracic spine pain   Urge incontinence   Vitamin D deficiency   Disturbance of salivary secretion   Xerostomia   Chronic kidney disease, stage V (very severe) (CMS-HCC)   Chronic kidney disease   Gastro-esophageal reflux disease without esophagitis   Hyperlipidemia associated with type 2 diabetes mellitus (CMS-HCC)   Hyperlipidemia   Mixed hyperlipidemia   Primary open angle glaucoma (POAG) of both eyes, severe stage      Past Surgical History           Past Surgical History:  Procedure Laterality Date   GLAUCOMA EYE SURGERY Left 09/2006    SLT   GLAUCOMA EYE SURGERY Left 05/29/2011    SLT   EYE SURGERY  Left 08/2011    TRAB   GLAUCOMA EYE SURGERY Left 08/30/2011    Trab [facility]   TRABECULECTOMY W/SCARRING Right 04/17/2012    Procedure: TRABECULECTOMY W/SCARRING;  Surgeon: Wallace Cullens, MD;  Location: [place];  Service: Ophthalmology;  Laterality: Right;   TRANSPLANT KIDNEY   04/02/2017   EXTRACTION CATARACT EXTRACAPSULAR W/INSERTION INTRAOCULAR PROSTHESIS Right 02/11/2020    Procedure: EXTRACAPSULAR CATARACT PHACO REMOVAL WITH INSERTION OF INTRAOCULAR LENS PROSTHESIS (1 STAGE PROCEDURE), MANUAL OR MECHANICAL TECHNIQUE; WITHOUT ENDOSCOPIC CYCLOPHOTOCOAGULATION;  Surgeon: Wallace Cullens, MD;  Location: [place];  Service: Ophthalmology;  Laterality: Right;   INSERTION AQUEOUS SHUNT Right 02/11/2020    Procedure: AQUEOUS SHUNT  TO EXTRAOCULAR EQUATORIAL PLATE RESERVOIR, EXTERNAL APPROACH; WITH GRAFT;  Surgeon: Wallace Cullens, MD;  [facility];  Service: Ophthalmology;  Laterality: Right;   INSERTION AQUEOUS SHUNT Left 02/25/2021    Procedure: INSERTION AQUEOUS SHUNT;  Surgeon: Wallace Cullens, MD;  [facility];  Service: Ophthalmology;  Laterality: Left;   EXTRACTION CATARACT EXTRACAPSULAR W/INSERTION INTRAOCULAR PROSTHESIS Left 11/23/2021    Procedure: EXTRACTION CATARACT EXTRACAPSULAR W/INSERTION INTRAOCULAR PROSTHESIS;  Surgeon: Wallace Cullens, MD;  Location: [place];  Service: Ophthalmology;  Laterality: Left;   GLAUCOMA EYE SURGERY        Trab OD   SLING FOR STRESS INCONTINENCE N/A     TRANSPLANT KIDNEY            Allergies           Allergies  Allergen Reactions   Shellfish Containing Products Swelling   Brimonidine Other (See Comments)      eye redness   Tramadol Dizziness      Dizziness even at 1/2 tab                   Current Outpatient Medications on File Prior to Visit  Medication Sig Dispense Refill   acetaminophen (TYLENOL) 500 MG tablet Take 500 mg by mouth as needed for Pain       alendronate (FOSAMAX) 70 MG tablet Take 70 mg by mouth every 7 (seven) days Take with a full glass of water. Do not lie down for the next 30 min.       amLODIPine (NORVASC) 10 MG tablet Take 10 mg by mouth once daily          aspirin 81 MG chewable tablet Take 81 mg by mouth.       carvedilol (COREG) 6.25 MG tablet Take by mouth Twice daily.       cholecalciferol (VITAMIN D3) 1,000 unit tablet Take by mouth.       ciprofloxacin HCl (CILOXAN) 0.3 % ophthalmic solution Place 1 drop into the left eye 4 (four) times daily Start day of surgery 5 mL 0   cyclobenzaprine (FLEXERIL) 5 MG tablet Take 5 mg by mouth as needed       DITROPAN XL 10 mg XL tablet Take by mouth Daily       dorzolamide-timoloL (COSOPT) 22.3-6.8 mg/mL ophthalmic solution Place 1 drop into both eyes 2 (two) times daily  30 mL 3   FUROsemide (LASIX) 40 MG tablet         hydrALAZINE (APRESOLINE) 25 MG tablet Take 25 mg by mouth 3 (three) times daily       HYZAAR 100-12.5 mg tablet Take by mouth       ketorolac (ACULAR) 0.5 % ophthalmic solution Place 1 drop into the left eye 4 (four) times daily 10 mL 2  latanoprost (XALATAN) 0.005 % ophthalmic solution Place 1 drop into the right eye at bedtime 7.5 mL 3   mirabegron (MYRBETRIQ) 50 mg ER tablet Take 50 mg by mouth daily.       mycophenolate (MYFORTIC) 180 MG EC tablet Take 180 mg by mouth 2 (two) times daily.       pantoprazole (PROTONIX) 20 MG DR tablet Take 20 mg by mouth once daily       polyethylene glycol (MIRALAX) powder Take 4.25 g by mouth as needed       pravastatin (PRAVACHOL) 80 MG tablet Take 80 mg by mouth Daily       prednisoLONE acetate (PRED FORTE) 1 % ophthalmic suspension Place 1 drop into the left eye 4 (four) times daily 10 mL 2   sodium bicarbonate 650 MG tablet Take 650 mg by mouth 4 (four) times daily       tacrolimus (PROGRAF) 1 MG capsule Take 1 mg by mouth 2 (two) times daily Takes 2 in am. And 2 at night         traZODone (DESYREL) 50 MG tablet          No current facility-administered medications on file prior to visit.      Family History           Family History  Problem Relation Age of Onset   High blood pressure (Hypertension) Mother     Diabetes Mother     Hyperlipidemia (Elevated cholesterol) Brother     High blood pressure (Hypertension) Brother     Cataracts Maternal Grandmother     Glaucoma Neg Hx     Macular degeneration Neg Hx     Anesthesia problems Neg Hx          Social History         Tobacco Use  Smoking Status Former  Smokeless Tobacco Never      Social History  Social History             Socioeconomic History   Marital status: Married      Spouse name: Malisha Mabey   Number of children: 1  Occupational History   Occupation: Retired  Tobacco Use   Smoking status: Former   Smokeless  tobacco: Never  Scientific laboratory technician Use: Never used  Substance and Sexual Activity   Alcohol use: No   Drug use: No   Sexual activity: Yes      Partners: Male        Objective:      Vitals:   06/16/22 0811  BP: (!) 166/74  Pulse: 60  Resp: 15  Temp: 97.7 F (36.5 C)  SpO2: 97%    Exam Gen: NAD CV: RRR Lungs: CTA Abd: soft Rectal: see below     Labs, Imaging and Diagnostic Testing: Anal manometry shows decreasing resting tone with good squeeze pressure     Bowel diary.  Patient completed this for 20 days.  She recorded 42 episodes of leakage noted.  She reported approximately 10 episodes of loose stools.   Assessment and Plan:  There are no diagnoses linked to this encounter.  71 year old female with 1 year history of worsening fecal incontinence.  Anal manometry shows decreased internal sphincter function.  On exam she was noted to have soft stool in her rectal vault.  I recommended that she stop taking MiraLAX and use a fiber supplement on a daily basis to help bulk up her bowel movements.  I think she  would be an excellent candidate for sacral nerve stimulator placement now that her BM's more formed.  We discussed risks of bleeding, device malfunction, pain and infection.  All questions answered.     Rosario Adie, MD Colon and Rectal Surgery [facility]

## 2022-06-16 NOTE — Progress Notes (Addendum)
Reported patient not taking BB this am and BP 166/74 HR 60 to Dr. Myrtie Soman. Will not administer BB this am.  Patient may take it once she is home per Dr. Kalman Shan.

## 2022-06-16 NOTE — Anesthesia Preprocedure Evaluation (Signed)
Anesthesia Evaluation  Patient identified by MRN, date of birth, ID band Patient awake    Reviewed: Allergy & Precautions, NPO status , Patient's Chart, lab work & pertinent test results  Airway Mallampati: II  TM Distance: >3 FB Neck ROM: Full    Dental no notable dental hx.    Pulmonary neg pulmonary ROS, former smoker,    Pulmonary exam normal breath sounds clear to auscultation       Cardiovascular hypertension, Normal cardiovascular exam Rhythm:Regular Rate:Normal     Neuro/Psych negative neurological ROS  negative psych ROS   GI/Hepatic negative GI ROS, Neg liver ROS,   Endo/Other  negative endocrine ROSdiabetes  Renal/GU Renal diseaseS/P kidney tx  negative genitourinary   Musculoskeletal negative musculoskeletal ROS (+)   Abdominal   Peds negative pediatric ROS (+)  Hematology negative hematology ROS (+)   Anesthesia Other Findings   Reproductive/Obstetrics negative OB ROS                             Anesthesia Physical Anesthesia Plan  ASA: 2  Anesthesia Plan: MAC   Post-op Pain Management: Minimal or no pain anticipated   Induction: Intravenous  PONV Risk Score and Plan: 2 and Ondansetron, Propofol infusion and Treatment may vary due to age or medical condition  Airway Management Planned: Simple Face Mask  Additional Equipment:   Intra-op Plan:   Post-operative Plan:   Informed Consent: I have reviewed the patients History and Physical, chart, labs and discussed the procedure including the risks, benefits and alternatives for the proposed anesthesia with the patient or authorized representative who has indicated his/her understanding and acceptance.     Dental advisory given  Plan Discussed with: CRNA and Surgeon  Anesthesia Plan Comments:         Anesthesia Quick Evaluation

## 2022-06-16 NOTE — H&P (Signed)
MRN: [id number] DOB: [birth year] DATE OF ENCOUNTER: 04/03/2022   Subjective    Chief Complaint: No chief complaint on file.       History of Present Illness: Kristen Kirk is a 71 y.o. female who was seen recently as an office consultation for fecal incontinence.  She presented to the office with approximately 1 year of worsening incontinence.  She states that she has leakage on a pad several times a day.  This is worse with loose stools.  She has regular bowel function on most days.  She underwent anorectal manometry and was noted to have decreased resting tone but good squeeze pressure.  We switched her to fiber daily instead of miralax.  She is having formed stools but continues to have some leakage.   Review of Systems: A complete review of systems was obtained from the patient.  I have reviewed this information and discussed as appropriate with the patient.  See HPI as well for other ROS.     Medical History: Past Medical History         Past Medical History:  Diagnosis Date   Cataract cortical, senile     Claustrophobia      Mild per patient statement, O2 mask ok   Diabetes mellitus without complication (CMS-HCC)      Past hx, now no longer requiring medications   Glaucoma (increased eye pressure)      POAG OU   High cholesterol     Hypertension               Patient Active Problem List  Diagnosis   Hyperlipidemia   GERD (gastroesophageal reflux disease)   Chronic kidney disease   POAG (primary open-angle glaucoma)   Nuclear sclerotic cataract of both eyes - Both Eyes   Acquired leg length discrepancy   Advanced care planning/counseling discussion   Medicare annual wellness visit, subsequent   Alopecia   Arteriovenous fistula thrombosis (CMS-HCC)   ASCUS of cervix with negative high risk HPV   Chronic lower back pain   Blurry vision   Constipation due to outlet dysfunction   Incontinence of feces   Mixed incontinence urge and stress   Type II diabetes  mellitus with renal manifestations, uncontrolled   Disorder of rotator cuff syndrome of shoulder and allied disorder   Dizziness   Vertigo   Encopresis, nonorganic origin   Essential hypertension   Flatulence, eructation and gas pain   Glaucoma   Insomnia   Iron deficiency   Kidney transplant status   Left sided abdominal pain, unspecified   Memory impairment   Muscle cramps   Osteoporosis   Postmenopausal atrophic vaginitis   Rectal bleeding   Right shoulder pain   Syncope and collapse   Unspecified osteoarthritis, unspecified site   Thoracic spine pain   Urge incontinence   Vitamin D deficiency   Disturbance of salivary secretion   Xerostomia   Chronic kidney disease, stage V (very severe) (CMS-HCC)   Chronic kidney disease   Gastro-esophageal reflux disease without esophagitis   Hyperlipidemia associated with type 2 diabetes mellitus (CMS-HCC)   Hyperlipidemia   Mixed hyperlipidemia   Primary open angle glaucoma (POAG) of both eyes, severe stage      Past Surgical History           Past Surgical History:  Procedure Laterality Date   GLAUCOMA EYE SURGERY Left 09/2006    SLT   GLAUCOMA EYE SURGERY Left 05/29/2011    SLT   EYE SURGERY  Left 08/2011    TRAB   GLAUCOMA EYE SURGERY Left 08/30/2011    Trab [facility]   TRABECULECTOMY W/SCARRING Right 04/17/2012    Procedure: TRABECULECTOMY W/SCARRING;  Surgeon: Wallace Cullens, MD;  Location: [place];  Service: Ophthalmology;  Laterality: Right;   TRANSPLANT KIDNEY   04/02/2017   EXTRACTION CATARACT EXTRACAPSULAR W/INSERTION INTRAOCULAR PROSTHESIS Right 02/11/2020    Procedure: EXTRACAPSULAR CATARACT PHACO REMOVAL WITH INSERTION OF INTRAOCULAR LENS PROSTHESIS (1 STAGE PROCEDURE), MANUAL OR MECHANICAL TECHNIQUE; WITHOUT ENDOSCOPIC CYCLOPHOTOCOAGULATION;  Surgeon: Wallace Cullens, MD;  Location: [place];  Service: Ophthalmology;  Laterality: Right;   INSERTION AQUEOUS SHUNT Right 02/11/2020    Procedure: AQUEOUS SHUNT  TO EXTRAOCULAR EQUATORIAL PLATE RESERVOIR, EXTERNAL APPROACH; WITH GRAFT;  Surgeon: Wallace Cullens, MD;  [facility];  Service: Ophthalmology;  Laterality: Right;   INSERTION AQUEOUS SHUNT Left 02/25/2021    Procedure: INSERTION AQUEOUS SHUNT;  Surgeon: Wallace Cullens, MD;  [facility];  Service: Ophthalmology;  Laterality: Left;   EXTRACTION CATARACT EXTRACAPSULAR W/INSERTION INTRAOCULAR PROSTHESIS Left 11/23/2021    Procedure: EXTRACTION CATARACT EXTRACAPSULAR W/INSERTION INTRAOCULAR PROSTHESIS;  Surgeon: Wallace Cullens, MD;  Location: [place];  Service: Ophthalmology;  Laterality: Left;   GLAUCOMA EYE SURGERY        Trab OD   SLING FOR STRESS INCONTINENCE N/A     TRANSPLANT KIDNEY            Allergies           Allergies  Allergen Reactions   Shellfish Containing Products Swelling   Brimonidine Other (See Comments)      eye redness   Tramadol Dizziness      Dizziness even at 1/2 tab                   Current Outpatient Medications on File Prior to Visit  Medication Sig Dispense Refill   acetaminophen (TYLENOL) 500 MG tablet Take 500 mg by mouth as needed for Pain       alendronate (FOSAMAX) 70 MG tablet Take 70 mg by mouth every 7 (seven) days Take with a full glass of water. Do not lie down for the next 30 min.       amLODIPine (NORVASC) 10 MG tablet Take 10 mg by mouth once daily          aspirin 81 MG chewable tablet Take 81 mg by mouth.       carvedilol (COREG) 6.25 MG tablet Take by mouth Twice daily.       cholecalciferol (VITAMIN D3) 1,000 unit tablet Take by mouth.       ciprofloxacin HCl (CILOXAN) 0.3 % ophthalmic solution Place 1 drop into the left eye 4 (four) times daily Start day of surgery 5 mL 0   cyclobenzaprine (FLEXERIL) 5 MG tablet Take 5 mg by mouth as needed       DITROPAN XL 10 mg XL tablet Take by mouth Daily       dorzolamide-timoloL (COSOPT) 22.3-6.8 mg/mL ophthalmic solution Place 1 drop into both eyes 2 (two) times daily  30 mL 3   FUROsemide (LASIX) 40 MG tablet         hydrALAZINE (APRESOLINE) 25 MG tablet Take 25 mg by mouth 3 (three) times daily       HYZAAR 100-12.5 mg tablet Take by mouth       ketorolac (ACULAR) 0.5 % ophthalmic solution Place 1 drop into the left eye 4 (four) times daily 10 mL 2  latanoprost (XALATAN) 0.005 % ophthalmic solution Place 1 drop into the right eye at bedtime 7.5 mL 3   mirabegron (MYRBETRIQ) 50 mg ER tablet Take 50 mg by mouth daily.       mycophenolate (MYFORTIC) 180 MG EC tablet Take 180 mg by mouth 2 (two) times daily.       pantoprazole (PROTONIX) 20 MG DR tablet Take 20 mg by mouth once daily       polyethylene glycol (MIRALAX) powder Take 4.25 g by mouth as needed       pravastatin (PRAVACHOL) 80 MG tablet Take 80 mg by mouth Daily       prednisoLONE acetate (PRED FORTE) 1 % ophthalmic suspension Place 1 drop into the left eye 4 (four) times daily 10 mL 2   sodium bicarbonate 650 MG tablet Take 650 mg by mouth 4 (four) times daily       tacrolimus (PROGRAF) 1 MG capsule Take 1 mg by mouth 2 (two) times daily Takes 2 in am. And 2 at night         traZODone (DESYREL) 50 MG tablet          No current facility-administered medications on file prior to visit.      Family History           Family History  Problem Relation Age of Onset   High blood pressure (Hypertension) Mother     Diabetes Mother     Hyperlipidemia (Elevated cholesterol) Brother     High blood pressure (Hypertension) Brother     Cataracts Maternal Grandmother     Glaucoma Neg Hx     Macular degeneration Neg Hx     Anesthesia problems Neg Hx          Social History         Tobacco Use  Smoking Status Former  Smokeless Tobacco Never      Social History  Social History             Socioeconomic History   Marital status: Married      Spouse name: Nidya Bouyer   Number of children: 1  Occupational History   Occupation: Retired  Tobacco Use   Smoking status: Former   Smokeless  tobacco: Never  Scientific laboratory technician Use: Never used  Substance and Sexual Activity   Alcohol use: No   Drug use: No   Sexual activity: Yes      Partners: Male        Objective:      Vitals:   06/16/22 0811  BP: (!) 166/74  Pulse: 60  Resp: 15  Temp: 97.7 F (36.5 C)  SpO2: 97%    Exam Gen: NAD CV: RRR Lungs: CTA Abd: soft Rectal: see below     Labs, Imaging and Diagnostic Testing: Anal manometry shows decreasing resting tone with good squeeze pressure     Bowel diary.  Patient completed this for 20 days.  She recorded 42 episodes of leakage noted.  She reported approximately 10 episodes of loose stools.   Assessment and Plan:  There are no diagnoses linked to this encounter.  71 year old female with 1 year history of worsening fecal incontinence.  Anal manometry shows decreased internal sphincter function.  On exam she was noted to have soft stool in her rectal vault.  I recommended that she stop taking MiraLAX and use a fiber supplement on a daily basis to help bulk up her bowel movements.  I think she  would be an excellent candidate for sacral nerve stimulator placement now that her BM's more formed.  We discussed risks of bleeding, device malfunction, pain and infection.  All questions answered.     Rosario Adie, MD Colon and Rectal Surgery [facility]

## 2022-06-16 NOTE — Discharge Instructions (Addendum)
GENERAL SURGERY: POST OP INSTRUCTIONS  DIET: Follow a light bland diet the first 24 hours after arrival home, such as soup, liquids, crackers, etc.  Be sure to include lots of fluids daily.  Avoid fast food or heavy meals as your are more likely to get nauseated.   Take your usually prescribed home medications unless otherwise directed. PAIN CONTROL: Pain is best controlled by a usual combination of three different methods TOGETHER: Ice/Heat Over the counter pain medication Prescription pain medication Most patients will experience some swelling and bruising around the incisions.  Ice packs or heating pads (30-60 minutes up to 6 times a day) will help. Use ice for the first few days to help decrease swelling and bruising, then switch to heat to help relax tight/sore spots and speed recovery.  Some people prefer to use ice alone, heat alone, alternating between ice & heat.  Experiment to what works for you.  Swelling and bruising can take several weeks to resolve.   It is helpful to take an over-the-counter pain medication regularly for the first few weeks.  Choose one of the following that works best for you: Naproxen (Aleve, etc)  Two 220mg  tabs twice a day Ibuprofen (Advil, etc) Three 200mg  tabs four times a day (every meal & bedtime) A  prescription for pain medication (such as Percocet, oxycodone, hydrocodone, etc) should be given to you upon discharge.  Take your pain medication as prescribed.  If you are having problems/concerns with the prescription medicine (does not control pain, nausea, vomiting, rash, itching, etc), please call us [phone] to see if we need to switch you to a different pain medicine that will work better for you and/or control your side effect better. If you need a refill on your pain medication, please contact your pharmacy.  They will contact our office to request authorization. Prescriptions will not be filled after 5 pm or on week-ends. Avoid getting constipated.   Between the surgery and the pain medications, it is common to experience some constipation.  Increasing fluid intake and taking a fiber supplement (such as Metamucil, Citrucel, FiberCon, MiraLax, etc) 1-2 times a day regularly will usually help prevent this problem from occurring.  A mild laxative (prune juice, Milk of Magnesia, MiraLax, etc) should be taken according to package directions if there are no bowel movements after 48 hours.   Sponge bath while device is in place You may leave the incision open to air.  You may have skin tapes (Steri Strips) covering the incision(s).  Leave them on until one week, then remove.  You may replace a dressing/Band-Aid to cover the incision for comfort if you wish.   ACTIVITIES as tolerated:   You may resume regular (light) daily activities beginning the next day--such as daily self-care, walking, climbing stairs--gradually increasing activities as tolerated.  If you can walk 30 minutes without difficulty, it is safe to try more intense activity such as jogging, treadmill, bicycling, low-impact aerobics, swimming, etc. Save the most intensive and strenuous activity for last such as sit-ups, heavy lifting, contact sports, etc  Refrain from any heavy lifting or straining until you are off narcotics for pain control.   DO NOT PUSH THROUGH PAIN.  Let pain be your guide: If it hurts to do something, don't do it.  Pain is your body warning you to avoid that activity for another week until the pain goes down. You may drive when you are no longer taking prescription pain medication, you can comfortably wear a  seatbelt, and you can safely maneuver your car and apply brakes. You may have sexual intercourse when it is comfortable.  FOLLOW UP in our office Please call [facility] at [phone] to set up an appointment to see your surgeon in the office for a follow-up appointment approximately 2-3 weeks after your surgery. Make sure that you call for this appointment the day you  arrive home to insure a convenient appointment time. 9. IF YOU HAVE DISABILITY OR FAMILY LEAVE FORMS, BRING THEM TO THE OFFICE FOR PROCESSING.  DO NOT GIVE THEM TO YOUR DOCTOR.   WHEN TO CALL us [phone]: Poor pain control Reactions / problems with new medications (rash/itching, nausea, etc)  Fever over 101.5 F (38.5 C) Worsening swelling or bruising Continued bleeding from incision. Increased pain, redness, or drainage from the incision   The clinic staff is available to answer your questions during regular business hours (8:30am-5pm).  Please don't hesitate to call and ask to speak to one of our nurses for clinical concerns.   If you have a medical emergency, go to the nearest emergency room or call 911.  A surgeon from [facility] is always on call at the [facility], [place], [place], [place], [place]  [id number] ? MAIN: [phone] ? TOLL FREE: [phone] ?  FAX [phone] [email or web address]        No acetaminophen/Tylenol until after 2:30 pm today if needed.      Post Anesthesia Home Care Instructions  Activity: Get plenty of rest for the remainder of the day. A responsible individual must stay with you for 24 hours following the procedure.  For the next 24 hours, DO NOT: -Drive a car -Paediatric nurse -Drink alcoholic beverages -Take any medication unless instructed by your physician -Make any legal decisions or sign important papers.  Meals: Start with liquid foods such as gelatin or soup. Progress to regular foods as tolerated. Avoid greasy, spicy, heavy foods. If nausea and/or vomiting occur, drink only clear liquids until the nausea and/or vomiting subsides. Call your physician if vomiting continues.  Special Instructions/Symptoms: Your throat may feel dry or sore from the anesthesia or the breathing tube placed in your throat during surgery. If this causes discomfort, gargle  with warm salt water. The discomfort should disappear within 24 hours.

## 2022-06-16 NOTE — Op Note (Signed)
06/16/2022  11:12 AM  PATIENT:  Kristen Kirk  71 y.o. female  Patient Care Team: Ria Bush, MD as PCP - General  PRE-OPERATIVE DIAGNOSIS:  fecal incontinence  POST-OPERATIVE DIAGNOSIS:  fecal incontinence  PROCEDURE:  SACRAL NERVE STIMULATOR IMPLANTATION test phase    Surgeon(s): Leighton Ruff, MD  ASSISTANT: none   ANESTHESIA:   local and MAC  EBL:  Total I/O In: 800 [I.V.:700; IV Piggyback:100] Out: 2 [Blood:2]  DRAINS: none   SPECIMEN:  No Specimen  DISPOSITION OF SPECIMEN:  N/A  COUNTS:  YES  PLAN OF CARE: Discharge to home after PACU  PATIENT DISPOSITION:  PACU - hemodynamically stable.  INDICATION: Fecal incontinence refractory to medical treatment.  The patient suffers from fecal incontinence ~ 2 episodes per day.  she has tried fiber supplementation and imodium to manage this without success.  Anal manometry shows decreased resting tone with good squeeze pressure.   The risks and benefits of the surgery were described to the patient and consent was signed and placed on chart prior to the OR.  DESCRIPTION: the patient was identified in the preoperative holding area and taken to the OR where they were laid prone on the operating room table.  MAC anesthesia was induced without difficulty. SCDs were also noted to be in place prior to the initiation of anesthesia.  Pillows were placed under lower abdomen to flatten the sacrum and under shins to allow the toes to dangle freely. A ground pad was placed on the bottom of the patient's foot and the proximal ends of the j-hook patient cable were connected to the ground pad and the external neurostimulator (ENS).The patient was then prepped and draped in the usual sterile fashion.  A surgical timeout was performed indicating the correct patient, procedure, positioning and need for preoperative antibiotics.  The c-arm was moved into AP position to provide fluoroscopic guidance of the sacrum. The medial edges of the  foramina were identified and marked. The c-arm was then moved into the lateral position to identify the S3 foramen. Once the needle entry point was determined, local injection of 0.5% Marcaine with epinephrine was administered bilaterally. A foramen needle was placed in the superior, medial aspect of the left S3 foramen and appropriate needle depth was visualized utilizing fluoroscopy. Proper S3 needle location was also confirmed by direct observation of the lifting of the perineum or "bellowing," and plantar flexion of the great toe utilizing the j-hook patient cable, the external neurostimulator and Verify controller. The foramen needle stylet was removed and a directional guide was placed through the needle using markers on the guide to assure appropriate depth. The foramen needle was removed by sliding over the directional guide. A small incision was made peripherally to the directional guide through the skin. The lead introducer with dilator was placed over the directional guide and utilizing fluoroscopic guidance, the lead introducer was advanced until the radiopaque mark was half-way through the foramen. The dilator was removed along with the directional guide. Using fluoroscopy, the tined lead with bent stylet was placed through the introducer until electrodes two and three straddled the anterior surface of the sacrum. All four electrodes were tested, observing "bellows" and plantar flexion of the great toe utilizing the j-hook patient cable and the external neurostimulator with Verify controller. After satisfactory lead positioning was confirmed, the introducer was retracted over the lead under continuous fluoroscopy, deploying the tines into presacral tissue. Retesting of all four electrodes confirming appropriate responses was completed.   The potential  internal neurostimulator pocket site was identified below the iliac crest and lateral to the sacrum. Local anesthesia was administered and an incision  was made into the subcutaneous tissue creating a connection site. Blunt dissection was used to create a small pocket with hemostasis achieved.  A tunneling tool with sheath was placed from the lead exit site subcutaneously to the small incised pocket site. The tunneling tool was removed and the lead was fed through the sheath, exiting at the pocket connection site. The sheath was removed. The lead was cleaned and dried. A protective boot was placed over the lead; the lead was inserted into the temporary percutaneous extension with visual confirmation of blue tip advancement. The setscrew was tightened with the torque wrench until audible clicks were heard. Using the tunneling tool and sheath, a subcutaneous tunnel was created from the pocket site to the contralateral buttock and exited at a localized site. The percutaneous extension was placed through the sheath, the sheath removed, and the extension exiting the site.  The connection components were placed into the incision. The incisions were closed with 3-0 Vicryl subcuticular sutures and 4-0 Vicryl skin sutures. The incisions were closed with Dermabond.  Adhesive strips and gauze were placed over the percutaneous extension exit site and secured with transparent dressing.   The percutaneous extension was attached to the external white twist lock cable. Gauze was placed under the twist-lock connection with cable strain relief and secured using transparent dressing. The twist lock cable was then plugged into the external neuorstimulator. The patient was transferred to PACU in satisfactory condition. Using the Verify controller and external neurostimulator, the patient was programmed to the electrode of optimum sensation and provided utilization instructions prior to discharge. Patient will complete a voiding diary during testing period to help document results of this test procedure.   Rosario Adie, MD  [facility]

## 2022-06-16 NOTE — Transfer of Care (Signed)
Immediate Anesthesia Transfer of Care Note  Patient: Kristen Kirk  Procedure(s) Performed: SACRAL NERVE STIMULATOR IMPLANTATION test phase  Patient Location: PACU  Anesthesia Type:MAC  Level of Consciousness: drowsy, patient cooperative and responds to stimulation  Airway & Oxygen Therapy: Patient Spontanous Breathing  Post-op Assessment: Report given to RN, Post -op Vital signs reviewed and stable and Patient moving all extremities  Post vital signs: Reviewed and stable  Last Vitals:  Vitals Value Taken Time  BP    Temp    Pulse 70 06/16/22 1123  Resp 17 06/16/22 1123  SpO2 99 % 06/16/22 1123  Vitals shown include unvalidated device data.  Last Pain:  Vitals:   06/16/22 0811  TempSrc: Oral  PainSc: 0-No pain      Patients Stated Pain Goal: 4 (79/48/01 6553)  Complications: No notable events documented.

## 2022-06-16 NOTE — Anesthesia Postprocedure Evaluation (Signed)
Anesthesia Post Note  Patient: Kristen Kirk  Procedure(Kirk) Performed: SACRAL NERVE STIMULATOR IMPLANTATION test phase     Patient location during evaluation: PACU Anesthesia Type: MAC Level of consciousness: awake and alert Pain management: pain level controlled Vital Signs Assessment: post-procedure vital signs reviewed and stable Respiratory status: spontaneous breathing, nonlabored ventilation, respiratory function stable and patient connected to nasal cannula oxygen Cardiovascular status: stable and blood pressure returned to baseline Postop Assessment: no apparent nausea or vomiting Anesthetic complications: no   No notable events documented.  Last Vitals:  Vitals:   06/16/22 1130 06/16/22 1145  BP: 137/74 125/80  Pulse: 68 64  Resp: 14 13  Temp:    SpO2: 98% 97%    Last Pain:  Vitals:   06/16/22 1130  TempSrc:   PainSc: Asleep                 Kristen Kirk

## 2022-06-19 ENCOUNTER — Encounter (HOSPITAL_BASED_OUTPATIENT_CLINIC_OR_DEPARTMENT_OTHER): Payer: Self-pay | Admitting: General Surgery

## 2022-06-19 NOTE — Progress Notes (Addendum)
Addendum:  Called pt since her surgery has been rescheduled to 06-29-2022 at 1015.  Pt verbalized understanding to arrive at [place] after mn with exception clear liquids until 0715 @ [facility].    Spoke w/ via phone for pre-op interview--- pt Lab needs dos----  [employer], ekg             Lab results------ no COVID test -----patient states asymptomatic no test needed Arrive at ------- 0530 on 06-23-2022 NPO after MN NO Solid Food.  Clear liquids from MN until--- 0430 Med rec completed Medications to take morning of surgery ----- myfortic, prograf, coreg norvasc, protonix Diabetic medication ----- n/a Patient instructed no nail polish to be worn day of surgery Patient instructed to bring photo id and insurance card day of surgery Patient aware to have Driver (ride ) / caregiver  for 24 hours after surgery --- husband, george Patient Special Instructions ----- n/a Pre-Op special Istructions ----- n/a Pt stated no changes in medication or health history other than first stage sacral stimulator insertion on 06-16-2022. Patient verbalized understanding of instructions that were given at this phone interview. Patient denies shortness of breath, chest pain, fever, cough at this phone interview.

## 2022-06-21 DIAGNOSIS — R6883 Chills (without fever): Secondary | ICD-10-CM | POA: Diagnosis not present

## 2022-06-21 DIAGNOSIS — B349 Viral infection, unspecified: Secondary | ICD-10-CM | POA: Diagnosis not present

## 2022-06-22 ENCOUNTER — Ambulatory Visit: Payer: Self-pay | Admitting: General Surgery

## 2022-06-23 DIAGNOSIS — Z01818 Encounter for other preprocedural examination: Secondary | ICD-10-CM

## 2022-06-26 DIAGNOSIS — Z940 Kidney transplant status: Secondary | ICD-10-CM | POA: Diagnosis not present

## 2022-06-28 ENCOUNTER — Other Ambulatory Visit: Payer: Medicare Other

## 2022-06-29 ENCOUNTER — Encounter (HOSPITAL_BASED_OUTPATIENT_CLINIC_OR_DEPARTMENT_OTHER): Payer: Self-pay | Admitting: General Surgery

## 2022-06-29 ENCOUNTER — Other Ambulatory Visit: Payer: Self-pay

## 2022-06-29 ENCOUNTER — Ambulatory Visit (HOSPITAL_BASED_OUTPATIENT_CLINIC_OR_DEPARTMENT_OTHER)
Admission: RE | Admit: 2022-06-29 | Discharge: 2022-06-29 | Disposition: A | Discharge status: Home / Self Care | Payer: Medicare Other | Attending: General Surgery | Admitting: General Surgery

## 2022-06-29 ENCOUNTER — Ambulatory Visit (HOSPITAL_BASED_OUTPATIENT_CLINIC_OR_DEPARTMENT_OTHER): Payer: Medicare Other | Admitting: Certified Registered Nurse Anesthetist

## 2022-06-29 ENCOUNTER — Encounter (HOSPITAL_BASED_OUTPATIENT_CLINIC_OR_DEPARTMENT_OTHER)
Admission: RE | Disposition: A | Discharge status: Home / Self Care | Payer: Self-pay | Source: Home / Self Care | Attending: General Surgery

## 2022-06-29 DIAGNOSIS — H42 Glaucoma in diseases classified elsewhere: Secondary | ICD-10-CM | POA: Insufficient documentation

## 2022-06-29 DIAGNOSIS — Z940 Kidney transplant status: Secondary | ICD-10-CM | POA: Diagnosis not present

## 2022-06-29 DIAGNOSIS — R159 Full incontinence of feces: Secondary | ICD-10-CM | POA: Insufficient documentation

## 2022-06-29 DIAGNOSIS — E119 Type 2 diabetes mellitus without complications: Secondary | ICD-10-CM

## 2022-06-29 DIAGNOSIS — I129 Hypertensive chronic kidney disease with stage 1 through stage 4 chronic kidney disease, or unspecified chronic kidney disease: Secondary | ICD-10-CM | POA: Insufficient documentation

## 2022-06-29 DIAGNOSIS — N185 Chronic kidney disease, stage 5: Secondary | ICD-10-CM | POA: Insufficient documentation

## 2022-06-29 DIAGNOSIS — Z01818 Encounter for other preprocedural examination: Secondary | ICD-10-CM

## 2022-06-29 DIAGNOSIS — M1990 Unspecified osteoarthritis, unspecified site: Secondary | ICD-10-CM | POA: Diagnosis not present

## 2022-06-29 DIAGNOSIS — Z87891 Personal history of nicotine dependence: Secondary | ICD-10-CM | POA: Insufficient documentation

## 2022-06-29 DIAGNOSIS — I10 Essential (primary) hypertension: Secondary | ICD-10-CM | POA: Diagnosis not present

## 2022-06-29 DIAGNOSIS — I340 Nonrheumatic mitral (valve) insufficiency: Secondary | ICD-10-CM | POA: Diagnosis not present

## 2022-06-29 DIAGNOSIS — K219 Gastro-esophageal reflux disease without esophagitis: Secondary | ICD-10-CM | POA: Insufficient documentation

## 2022-06-29 DIAGNOSIS — E1122 Type 2 diabetes mellitus with diabetic chronic kidney disease: Secondary | ICD-10-CM | POA: Insufficient documentation

## 2022-06-29 DIAGNOSIS — E1139 Type 2 diabetes mellitus with other diabetic ophthalmic complication: Secondary | ICD-10-CM | POA: Insufficient documentation

## 2022-06-29 HISTORY — DX: Personal history of other diseases of urinary system: Z87.448

## 2022-06-29 HISTORY — DX: Personal history of other specified conditions: Z87.898

## 2022-06-29 HISTORY — DX: Unspecified glaucoma: H40.9

## 2022-06-29 LAB — GLUCOSE, CAPILLARY: Glucose-Capillary: 150 mg/dL — ABNORMAL HIGH (ref 70–99)

## 2022-06-29 LAB — POCT I-STAT, CHEM 8
BUN: 31 mg/dL — ABNORMAL HIGH (ref 8–23)
Calcium, Ion: 1.21 mmol/L (ref 1.15–1.40)
Chloride: 110 mmol/L (ref 98–111)
Creatinine, Ser: 1 mg/dL (ref 0.44–1.00)
Glucose, Bld: 134 mg/dL — ABNORMAL HIGH (ref 70–99)
HCT: 38 % (ref 36.0–46.0)
Hemoglobin: 12.9 g/dL (ref 12.0–15.0)
Potassium: 3.9 mmol/L (ref 3.5–5.1)
Sodium: 141 mmol/L (ref 135–145)
TCO2: 21 mmol/L — ABNORMAL LOW (ref 22–32)

## 2022-06-29 SURGERY — INSERTION, NEUROSTIMULATOR, SACRAL
Anesthesia: Monitor Anesthesia Care | Site: Buttocks | Laterality: Right

## 2022-06-29 MED ORDER — ACETAMINOPHEN 160 MG/5ML PO SOLN: 325.0000 mg | ORAL | Status: DC | PRN

## 2022-06-29 MED ORDER — ACETAMINOPHEN 10 MG/ML IV SOLN: 1000.0000 mg | Freq: Once | INTRAVENOUS | Status: DC | PRN

## 2022-06-29 MED ORDER — PROPOFOL 500 MG/50ML IV EMUL
INTRAVENOUS | Status: DC | PRN
  Administered 2022-06-29: 150 ug/kg/min via INTRAVENOUS

## 2022-06-29 MED ORDER — FENTANYL CITRATE (PF) 100 MCG/2ML IJ SOLN
INTRAMUSCULAR | Status: AC
  Filled 2022-06-29: qty 2

## 2022-06-29 MED ORDER — ACETAMINOPHEN 325 MG PO TABS: 325.0000 mg | ORAL_TABLET | ORAL | Status: DC | PRN

## 2022-06-29 MED ORDER — CEFAZOLIN SODIUM-DEXTROSE 2-4 GM/100ML-% IV SOLN
INTRAVENOUS | Status: AC
  Filled 2022-06-29: qty 100

## 2022-06-29 MED ORDER — FENTANYL CITRATE (PF) 100 MCG/2ML IJ SOLN: 25.0000 ug | INTRAMUSCULAR | Status: DC | PRN

## 2022-06-29 MED ORDER — ONDANSETRON HCL 4 MG/2ML IJ SOLN
INTRAMUSCULAR | Status: DC | PRN
  Administered 2022-06-29: 4 mg via INTRAVENOUS

## 2022-06-29 MED ORDER — PROPOFOL 10 MG/ML IV BOLUS
INTRAVENOUS | Status: AC
  Filled 2022-06-29: qty 20

## 2022-06-29 MED ORDER — PHENYLEPHRINE 80 MCG/ML (10ML) SYRINGE FOR IV PUSH (FOR BLOOD PRESSURE SUPPORT)
PREFILLED_SYRINGE | INTRAVENOUS | Status: DC | PRN
  Administered 2022-06-29: 80 ug via INTRAVENOUS

## 2022-06-29 MED ORDER — OXYCODONE HCL 5 MG PO TABS: 5.0000 mg | ORAL_TABLET | Freq: Once | ORAL | Status: DC | PRN

## 2022-06-29 MED ORDER — BUPIVACAINE-EPINEPHRINE 0.5% -1:200000 IJ SOLN
INTRAMUSCULAR | Status: DC | PRN
  Administered 2022-06-29: 20 mL

## 2022-06-29 MED ORDER — LIDOCAINE 2% (20 MG/ML) 5 ML SYRINGE
INTRAMUSCULAR | Status: DC | PRN
  Administered 2022-06-29: 50 mg via INTRAVENOUS

## 2022-06-29 MED ORDER — FENTANYL CITRATE (PF) 250 MCG/5ML IJ SOLN
INTRAMUSCULAR | Status: DC | PRN
  Administered 2022-06-29 (×2): 25 ug via INTRAVENOUS

## 2022-06-29 MED ORDER — MIDAZOLAM HCL 2 MG/2ML IJ SOLN
INTRAMUSCULAR | Status: DC | PRN
  Administered 2022-06-29: 1 mg via INTRAVENOUS

## 2022-06-29 MED ORDER — PROMETHAZINE HCL 25 MG/ML IJ SOLN: 6.2500 mg | INTRAMUSCULAR | Status: DC | PRN

## 2022-06-29 MED ORDER — CEFAZOLIN SODIUM-DEXTROSE 2-4 GM/100ML-% IV SOLN
2.0000 g | INTRAVENOUS | Status: AC
  Administered 2022-06-29: 2 g via INTRAVENOUS

## 2022-06-29 MED ORDER — MIDAZOLAM HCL 2 MG/2ML IJ SOLN
INTRAMUSCULAR | Status: AC
  Filled 2022-06-29: qty 2

## 2022-06-29 MED ORDER — STERILE WATER FOR IRRIGATION IR SOLN
Status: DC | PRN
  Administered 2022-06-29: 500 mL

## 2022-06-29 MED ORDER — PROPOFOL 10 MG/ML IV BOLUS
INTRAVENOUS | Status: DC | PRN
  Administered 2022-06-29: 10 mg via INTRAVENOUS

## 2022-06-29 MED ORDER — OXYCODONE HCL 5 MG/5ML PO SOLN: 5.0000 mg | Freq: Once | ORAL | Status: DC | PRN

## 2022-06-29 MED ORDER — ACETAMINOPHEN 500 MG PO TABS
1000.0000 mg | ORAL_TABLET | ORAL | Status: AC
  Administered 2022-06-29: 1000 mg via ORAL

## 2022-06-29 MED ORDER — AMISULPRIDE (ANTIEMETIC) 5 MG/2ML IV SOLN: 10.0000 mg | Freq: Once | INTRAVENOUS | Status: DC | PRN

## 2022-06-29 MED ORDER — ACETAMINOPHEN 500 MG PO TABS
ORAL_TABLET | ORAL | Status: AC
  Filled 2022-06-29: qty 2

## 2022-06-29 MED ORDER — SODIUM CHLORIDE 0.9 % IV SOLN: INTRAVENOUS | Status: DC

## 2022-06-29 SURGICAL SUPPLY — 37 items
ADH SKN CLS APL DERMABOND .7 (GAUZE/BANDAGES/DRESSINGS) ×1
APL PRP STRL LF DISP 70% ISPRP (MISCELLANEOUS) ×1
BLADE SURG 15 STRL LF DISP TIS (BLADE) ×1 IMPLANT
BLADE SURG 15 STRL SS (BLADE) ×1
CHLORAPREP W/TINT 26 (MISCELLANEOUS) ×1 IMPLANT
COVER BACK TABLE 60X90IN (DRAPES) ×1 IMPLANT
COVER MAYO STAND STRL (DRAPES) ×1 IMPLANT
COVER PROBE U/S 5X48 (MISCELLANEOUS) IMPLANT
DERMABOND ADVANCED .7 DNX12 (GAUZE/BANDAGES/DRESSINGS) ×1 IMPLANT
DRAPE INCISE IOBAN 66X45 STRL (DRAPES) ×1 IMPLANT
DRAPE LAPAROSCOPIC ABDOMINAL (DRAPES) ×1 IMPLANT
DRAPE UTILITY XL STRL (DRAPES) ×1 IMPLANT
ELECT REM PT RETURN 9FT ADLT (ELECTROSURGICAL) ×1
ELECTRODE REM PT RTRN 9FT ADLT (ELECTROSURGICAL) ×1 IMPLANT
GAUZE 4X4 16PLY ~~LOC~~+RFID DBL (SPONGE) ×1 IMPLANT
GLOVE BIO SURGEON STRL SZ 6.5 (GLOVE) ×1 IMPLANT
GLOVE BIO SURGEON STRL SZ7 (GLOVE) IMPLANT
GLOVE BIOGEL PI IND STRL 7.0 (GLOVE) ×1 IMPLANT
GLOVE INDICATOR 6.5 STRL GRN (GLOVE) ×1 IMPLANT
GOWN STRL REUS W/TWL LRG LVL3 (GOWN DISPOSABLE) IMPLANT
GOWN STRL REUS W/TWL XL LVL3 (GOWN DISPOSABLE) ×1 IMPLANT
KIT HANDSET INTERSTIM COMM (NEUROSURGERY SUPPLIES) IMPLANT
KIT TURNOVER CYSTO (KITS) ×1 IMPLANT
MANIFOLD NEPTUNE II (INSTRUMENTS) IMPLANT
NEEDLE HYPO 22GX1.5 SAFETY (NEEDLE) ×1 IMPLANT
PACK BASIN DAY SURGERY FS (CUSTOM PROCEDURE TRAY) ×1 IMPLANT
PENCIL SMOKE EVACUATOR (MISCELLANEOUS) ×1 IMPLANT
STIMULATOR INTERSTIM 2X1.7X.3 (Miscellaneous) IMPLANT
SUT VIC AB 3-0 SH 27 (SUTURE) ×1
SUT VIC AB 3-0 SH 27X BRD (SUTURE) ×1 IMPLANT
SUT VIC AB 4-0 PS2 18 (SUTURE) ×1 IMPLANT
SYR BULB IRRIG 60ML STRL (SYRINGE) ×1 IMPLANT
SYR CONTROL 10ML LL (SYRINGE) ×1 IMPLANT
TOWEL OR 17X26 10 PK STRL BLUE (TOWEL DISPOSABLE) ×1 IMPLANT
TUBE CONNECTING 12X1/4 (SUCTIONS) IMPLANT
WATER STERILE IRR 500ML POUR (IV SOLUTION) ×1 IMPLANT
YANKAUER SUCT BULB TIP NO VENT (SUCTIONS) IMPLANT

## 2022-06-29 NOTE — Transfer of Care (Signed)
Immediate Anesthesia Transfer of Care Note  Patient: Kristen Kirk  Procedure(s) Performed: INSERTION OF SACRAL NERVE STIMULATOR (Right: Buttocks)  Patient Location: PACU  Anesthesia Type:MAC  Level of Consciousness: awake, alert  and oriented  Airway & Oxygen Therapy: Patient Spontanous Breathing  Post-op Assessment: Report given to RN and Post -op Vital signs reviewed and stable  Post vital signs: Reviewed and stable  Last Vitals:  Vitals Value Taken Time  BP 152/73 06/29/22 1055  Temp    Pulse 56 06/29/22 1059  Resp 16 06/29/22 1059  SpO2 97 % 06/29/22 1059  Vitals shown include unvalidated device data.  Last Pain:  Vitals:   06/29/22 0832  TempSrc: Oral  PainSc: 0-No pain      Patients Stated Pain Goal: 4 (34/35/68 6168)  Complications: No notable events documented.

## 2022-06-29 NOTE — Anesthesia Preprocedure Evaluation (Addendum)
Anesthesia Evaluation  Patient identified by MRN, date of birth, ID band Patient awake    Reviewed: Allergy & Precautions, NPO status , Patient's Chart, lab work & pertinent test results  Airway Mallampati: II  TM Distance: >3 FB Neck ROM: Full    Dental  (+) Partial Upper   Pulmonary former smoker,    breath sounds clear to auscultation       Cardiovascular hypertension, Pt. on medications and Pt. on home beta blockers  Rhythm:Regular Rate:Normal  Echo: - Left ventricle: The cavity size was normal. Systolic function was  normal. The estimated ejection fraction was in the range of 55%  to 60%. Wall motion was normal; there were no regional wall  motion abnormalities. Left ventricular diastolic function  parameters were normal.  - Mitral valve: There was mild regurgitation.  - Left atrium: The atrium was mildly dilated.  - Right ventricle: Systolic function was normal.  - Pulmonary arteries: Systolic pressure was within the normal  range.    Neuro/Psych negative neurological ROS  negative psych ROS   GI/Hepatic Neg liver ROS, GERD  Medicated,  Endo/Other  diabetes  Renal/GU - s/p renal transplant     Musculoskeletal  (+) Arthritis ,   Abdominal Normal abdominal exam  (+)   Peds  Hematology negative hematology ROS (+)   Anesthesia Other Findings   Reproductive/Obstetrics                            Anesthesia Physical Anesthesia Plan  ASA: 2  Anesthesia Plan: MAC   Post-op Pain Management:    Induction: Intravenous  PONV Risk Score and Plan: 3 and Ondansetron, Propofol infusion and Treatment may vary due to age or medical condition  Airway Management Planned: Simple Face Mask and Natural Airway  Additional Equipment: None  Intra-op Plan:   Post-operative Plan:   Informed Consent: I have reviewed the patients History and Physical, chart, labs and discussed the  procedure including the risks, benefits and alternatives for the proposed anesthesia with the patient or authorized representative who has indicated his/her understanding and acceptance.       Plan Discussed with: CRNA  Anesthesia Plan Comments:        Anesthesia Quick Evaluation

## 2022-06-29 NOTE — Anesthesia Postprocedure Evaluation (Signed)
Anesthesia Post Note  Patient: Kristen Kirk  Procedure(s) Performed: INSERTION OF SACRAL NERVE STIMULATOR (Right: Buttocks)     Patient location during evaluation: PACU Anesthesia Type: MAC Level of consciousness: awake and alert Pain management: pain level controlled Vital Signs Assessment: post-procedure vital signs reviewed and stable Respiratory status: spontaneous breathing, nonlabored ventilation, respiratory function stable and patient connected to nasal cannula oxygen Cardiovascular status: stable and blood pressure returned to baseline Postop Assessment: no apparent nausea or vomiting Anesthetic complications: no   No notable events documented.  Last Vitals:  Vitals:   06/29/22 1130 06/29/22 1205  BP: (!) 169/78 (!) 144/77  Pulse: (!) 51 (!) 59  Resp: 13 14  Temp:  36.5 C  SpO2: 95% 99%    Last Pain:  Vitals:   06/29/22 1205  TempSrc:   PainSc: 0-No pain                 Effie Berkshire

## 2022-06-29 NOTE — Op Note (Signed)
06/29/2022  10:46 AM  PATIENT:  Kristen Kirk  71 y.o. female  Patient Care Team: Ria Bush, MD as PCP - General  PRE-OPERATIVE DIAGNOSIS:  FECAL INCONTINENCE  POST-OPERATIVE DIAGNOSIS:  FECAL INCONTINENCE  PROCEDURE:  INSERTION OF SACRAL NERVE STIMULATOR    Surgeon(s): Leighton Ruff, MD  ASSISTANT: none   ANESTHESIA:   local and MAC  EBL:  Total I/O In: 300 [I.V.:300] Out: 1 [Blood:1]  DRAINS: none   SPECIMEN:  No Specimen  DISPOSITION OF SPECIMEN:  N/A  COUNTS:  YES  PLAN OF CARE: Discharge to home after PACU  PATIENT DISPOSITION:  PACU - hemodynamically stable.  INDICATION: Fecal incontinence.  The patient has underwent the test phase for 10 days and has had a decrease of >50% of episodes since then.  The patient agrees to proceed with full implantation of the neuro stimulator device.   OR FINDINGS: Wire lead intact.  Impedence values < 1000.  DESCRIPTION: The patient was identified in the preoperative holding area and taken to the OR where they were laid prone on the operating room table.  MAC anesthesia was induced without difficulty. SCDs were also noted to be in place prior to the initiation of anesthesia.  The patient was then prepped and draped in the usual sterile fashion.   A surgical timeout was performed indicating the correct patient, procedure, positioning and need for preoperative antibiotics.  Local injection of Marcaine with epinephrine was administered.  The previous upper buttock incision was opened using blunt dissection and the lead/percutaneous connection was identified and evaluated. The percutaneous extension was cut on the braided wire and was removed from the field ensuring sterility. The subcutaneous pocket was enlarged to accommodate the internal neurostimulator and hemostasis was established.  Using the torque wrench, the screw was loosened and the percutaneous extension was disconnected from the lead. The lead was cleaned and  dried. The lead was inserted into the header of the InterStim neurostimulator until the blue tip was visualized at the distal window. The single set screw was tightened using the torque wrench until audible click was heard. The neurostimulator was placed into the pocket with the etched identification side placed upwards and any excessive lead was placed around the neurostimulator. The clinician programmer telemetry head, covered in a sterile sleeve, was placed over the implanted neurostimulator to ensure proper lead connection and that parameters were within normal range. Impedances were confirmed to be within normal limits, greater than 50 and less than 4,000 ohms. The wound was irrigated with sterile water and closed with a running 3-0 Vicryl subcuticular suture and a 4-0 Vicryl running skin suture. Counts were correct. Dermabond was placed over the incision. The patient was transferred to the PACU in satisfactory condition. Using the clinician programmer, the INS was programmed.   Patient was provided utilization instructions for the patient programmer prior to discharge.   Rosario Adie, MD  [facility]

## 2022-06-29 NOTE — Discharge Instructions (Addendum)
POST OP INSTRUCTIONS  Always review your discharge instruction sheet given to you by the facility where your surgery was performed.   If narcotic pain medicine is not needed, then you make take acetaminophen (Tylenol) or ibuprofen (Advil) as needed.  Take your usually prescribed medications unless otherwise directed. If you need a refill on your pain medication, please contact our office. All narcotic pain medicine now requires a paper prescription.  Phoned in and fax refills are no longer allowed by law.  Prescriptions will not be filled after 5 pm or on weekends.  You should follow a light diet for the remainder of the day after your procedure. Most patients will experience some mild swelling and/or bruising in the area of the incision. It may take several days to resolve. It is common to experience some constipation if taking pain medication after surgery. Increasing fluid intake and taking a stool softener (such as Colace) will usually help or prevent this problem from occurring. A mild laxative (Milk of Magnesia or Miralax) should be taken according to package directions if there are no bowel movements after 48 hours.  Unless discharge instructions indicate otherwise, you may shower in 24 hours.  The glue will flake off over the next 2-3 weeks.  ACTIVITIES:  Limit activity involving your arms for the next 72 hours. Do no strenuous exercise or activity for 1 week. You may drive when you are no longer taking prescription pain medication, you can comfortably wear a seatbelt, and you can maneuver your car. 10.You may need to see your doctor in the office for a follow-up appointment.  Please check with your doctor.    WHEN TO CALL YOUR DOCTOR [phone]): Fever over 101.0 Chills Continued bleeding from incision Increased redness and tenderness at the site Shortness of breath, difficulty breathing   The clinic staff is available to answer your questions during regular business hours. Please  don't hesitate to call and ask to speak to one of the nurses or medical assistants for clinical concerns. If you have a medical emergency, go to the nearest emergency room or call 911.  A surgeon from [facility] is always on call at the hospital.     For further information, please visit [email or web address]   No acetaminophen/Tylenol until after 3:03pm today if needed for pain.      Post Anesthesia Home Care Instructions  Activity: Get plenty of rest for the remainder of the day. A responsible individual must stay with you for 24 hours following the procedure.  For the next 24 hours, DO NOT: -Drive a car -Paediatric nurse -Drink alcoholic beverages -Take any medication unless instructed by your physician -Make any legal decisions or sign important papers.  Meals: Start with liquid foods such as gelatin or soup. Progress to regular foods as tolerated. Avoid greasy, spicy, heavy foods. If nausea and/or vomiting occur, drink only clear liquids until the nausea and/or vomiting subsides. Call your physician if vomiting continues.  Special Instructions/Symptoms: Your throat may feel dry or sore from the anesthesia or the breathing tube placed in your throat during surgery. If this causes discomfort, gargle with warm salt water. The discomfort should disappear within 24 hours.

## 2022-06-29 NOTE — Interval H&P Note (Signed)
History and Physical Interval Note:  06/29/2022 8:42 AM  Kristen Kirk  has presented today for surgery, with the diagnosis of FECAL INCONTINENCE.  The various methods of treatment have been discussed with the patient and family. After consideration of risks, benefits and other options for treatment, the patient has consented to  Procedure(s): IMPLANTATION OF SACRAL NERVE STIMULATOR VS REMOVAL OF TEST WIRE (N/A) as a surgical intervention.  The patient's history has been reviewed, patient examined, no change in status, stable for surgery.  I have reviewed the patient's chart and labs.  Questions were answered to the patient's satisfaction.    Pt with > 50% improvement in symptoms with test device.  She is here for final implantation.    Rosario Adie, MD  [facility]

## 2022-07-02 ENCOUNTER — Other Ambulatory Visit: Payer: Self-pay | Admitting: Family Medicine

## 2022-07-02 DIAGNOSIS — K219 Gastro-esophageal reflux disease without esophagitis: Secondary | ICD-10-CM

## 2022-07-02 HISTORY — PX: SACRAL NERVE STIMULATOR PLACEMENT: SHX2367

## 2022-07-03 NOTE — Telephone Encounter (Signed)
[facility] unable to eval/treat for this Dx. See referral notes. Msg to PCP about referral.

## 2022-07-05 DIAGNOSIS — H35352 Cystoid macular degeneration, left eye: Secondary | ICD-10-CM | POA: Diagnosis not present

## 2022-07-05 DIAGNOSIS — H18413 Arcus senilis, bilateral: Secondary | ICD-10-CM | POA: Diagnosis not present

## 2022-07-05 DIAGNOSIS — H401133 Primary open-angle glaucoma, bilateral, severe stage: Secondary | ICD-10-CM | POA: Diagnosis not present

## 2022-07-05 DIAGNOSIS — H43813 Vitreous degeneration, bilateral: Secondary | ICD-10-CM | POA: Diagnosis not present

## 2022-07-08 ENCOUNTER — Encounter: Payer: Self-pay | Admitting: Emergency Medicine

## 2022-07-08 ENCOUNTER — Other Ambulatory Visit: Payer: Self-pay

## 2022-07-08 DIAGNOSIS — D649 Anemia, unspecified: Secondary | ICD-10-CM | POA: Diagnosis not present

## 2022-07-08 DIAGNOSIS — Z419 Encounter for procedure for purposes other than remedying health state, unspecified: Secondary | ICD-10-CM | POA: Insufficient documentation

## 2022-07-08 DIAGNOSIS — H409 Unspecified glaucoma: Secondary | ICD-10-CM | POA: Diagnosis present

## 2022-07-08 DIAGNOSIS — R531 Weakness: Secondary | ICD-10-CM | POA: Diagnosis present

## 2022-07-08 DIAGNOSIS — Z79890 Hormone replacement therapy (postmenopausal): Secondary | ICD-10-CM

## 2022-07-08 DIAGNOSIS — I120 Hypertensive chronic kidney disease with stage 5 chronic kidney disease or end stage renal disease: Secondary | ICD-10-CM | POA: Diagnosis present

## 2022-07-08 DIAGNOSIS — T8619 Other complication of kidney transplant: Secondary | ICD-10-CM | POA: Diagnosis present

## 2022-07-08 DIAGNOSIS — E559 Vitamin D deficiency, unspecified: Secondary | ICD-10-CM | POA: Diagnosis present

## 2022-07-08 DIAGNOSIS — B961 Klebsiella pneumoniae [K. pneumoniae] as the cause of diseases classified elsewhere: Secondary | ICD-10-CM | POA: Diagnosis present

## 2022-07-08 DIAGNOSIS — K219 Gastro-esophageal reflux disease without esophagitis: Secondary | ICD-10-CM | POA: Diagnosis present

## 2022-07-08 DIAGNOSIS — D631 Anemia in chronic kidney disease: Secondary | ICD-10-CM | POA: Diagnosis present

## 2022-07-08 DIAGNOSIS — E785 Hyperlipidemia, unspecified: Secondary | ICD-10-CM | POA: Insufficient documentation

## 2022-07-08 DIAGNOSIS — N179 Acute kidney failure, unspecified: Secondary | ICD-10-CM | POA: Diagnosis not present

## 2022-07-08 DIAGNOSIS — M810 Age-related osteoporosis without current pathological fracture: Secondary | ICD-10-CM | POA: Diagnosis present

## 2022-07-08 DIAGNOSIS — D849 Immunodeficiency, unspecified: Secondary | ICD-10-CM | POA: Diagnosis not present

## 2022-07-08 DIAGNOSIS — E876 Hypokalemia: Secondary | ICD-10-CM | POA: Diagnosis not present

## 2022-07-08 DIAGNOSIS — Z992 Dependence on renal dialysis: Secondary | ICD-10-CM

## 2022-07-08 DIAGNOSIS — Z7960 Long term (current) use of unspecified immunomodulators and immunosuppressants: Secondary | ICD-10-CM

## 2022-07-08 DIAGNOSIS — I10 Essential (primary) hypertension: Secondary | ICD-10-CM | POA: Diagnosis not present

## 2022-07-08 DIAGNOSIS — N261 Atrophy of kidney (terminal): Secondary | ICD-10-CM | POA: Diagnosis not present

## 2022-07-08 DIAGNOSIS — Z91013 Allergy to seafood: Secondary | ICD-10-CM

## 2022-07-08 DIAGNOSIS — A4159 Other Gram-negative sepsis: Secondary | ICD-10-CM | POA: Diagnosis not present

## 2022-07-08 DIAGNOSIS — Z8249 Family history of ischemic heart disease and other diseases of the circulatory system: Secondary | ICD-10-CM

## 2022-07-08 DIAGNOSIS — Z9851 Tubal ligation status: Secondary | ICD-10-CM

## 2022-07-08 DIAGNOSIS — E860 Dehydration: Secondary | ICD-10-CM | POA: Diagnosis present

## 2022-07-08 DIAGNOSIS — Z940 Kidney transplant status: Secondary | ICD-10-CM | POA: Insufficient documentation

## 2022-07-08 DIAGNOSIS — Z83438 Family history of other disorder of lipoprotein metabolism and other lipidemia: Secondary | ICD-10-CM

## 2022-07-08 DIAGNOSIS — Z905 Acquired absence of kidney: Secondary | ICD-10-CM

## 2022-07-08 DIAGNOSIS — E1122 Type 2 diabetes mellitus with diabetic chronic kidney disease: Secondary | ICD-10-CM | POA: Diagnosis not present

## 2022-07-08 DIAGNOSIS — Z79899 Other long term (current) drug therapy: Secondary | ICD-10-CM

## 2022-07-08 DIAGNOSIS — Z885 Allergy status to narcotic agent status: Secondary | ICD-10-CM

## 2022-07-08 DIAGNOSIS — Z888 Allergy status to other drugs, medicaments and biological substances status: Secondary | ICD-10-CM

## 2022-07-08 DIAGNOSIS — I129 Hypertensive chronic kidney disease with stage 1 through stage 4 chronic kidney disease, or unspecified chronic kidney disease: Secondary | ICD-10-CM | POA: Diagnosis not present

## 2022-07-08 DIAGNOSIS — R159 Full incontinence of feces: Secondary | ICD-10-CM | POA: Insufficient documentation

## 2022-07-08 DIAGNOSIS — Z972 Presence of dental prosthetic device (complete) (partial): Secondary | ICD-10-CM

## 2022-07-08 DIAGNOSIS — N390 Urinary tract infection, site not specified: Secondary | ICD-10-CM | POA: Insufficient documentation

## 2022-07-08 DIAGNOSIS — Z87891 Personal history of nicotine dependence: Secondary | ICD-10-CM

## 2022-07-08 DIAGNOSIS — M1990 Unspecified osteoarthritis, unspecified site: Secondary | ICD-10-CM | POA: Diagnosis present

## 2022-07-08 DIAGNOSIS — Z833 Family history of diabetes mellitus: Secondary | ICD-10-CM

## 2022-07-08 DIAGNOSIS — N1831 Chronic kidney disease, stage 3a: Secondary | ICD-10-CM | POA: Diagnosis not present

## 2022-07-08 DIAGNOSIS — Z7982 Long term (current) use of aspirin: Secondary | ICD-10-CM

## 2022-07-08 DIAGNOSIS — K5730 Diverticulosis of large intestine without perforation or abscess without bleeding: Secondary | ICD-10-CM | POA: Diagnosis not present

## 2022-07-08 LAB — URINALYSIS, ROUTINE W REFLEX MICROSCOPIC
Bilirubin Urine: NEGATIVE
Glucose, UA: NEGATIVE mg/dL
Ketones, ur: NEGATIVE mg/dL
Nitrite: NEGATIVE
Protein, ur: 30 mg/dL — AB
Specific Gravity, Urine: 1.017 (ref 1.005–1.030)
WBC, UA: >50 WBC/hpf — ABNORMAL HIGH (ref 0–5)
pH: 5 (ref 5.0–8.0)

## 2022-07-08 LAB — CBC
HCT: 38.1 % (ref 36.0–46.0)
Hemoglobin: 11.8 g/dL — ABNORMAL LOW (ref 12.0–15.0)
MCH: 26 pg (ref 26.0–34.0)
MCHC: 31 g/dL (ref 30.0–36.0)
MCV: 83.9 fL (ref 80.0–100.0)
Platelets: 221 10*3/uL (ref 150–400)
RBC: 4.54 MIL/uL (ref 3.87–5.11)
RDW: 13.5 % (ref 11.5–15.5)
WBC: 13 10*3/uL — ABNORMAL HIGH (ref 4.0–10.5)
nRBC: 0 % (ref 0.0–0.2)

## 2022-07-08 LAB — BASIC METABOLIC PANEL
Anion gap: 11 (ref 5–15)
BUN: 37 mg/dL — ABNORMAL HIGH (ref 8–23)
CO2: 21 mmol/L — ABNORMAL LOW (ref 22–32)
Calcium: 9.2 mg/dL (ref 8.9–10.3)
Chloride: 100 mmol/L (ref 98–111)
Creatinine, Ser: 1.67 mg/dL — ABNORMAL HIGH (ref 0.44–1.00)
GFR, Estimated: 33 mL/min — ABNORMAL LOW (ref >60)
Glucose, Bld: 239 mg/dL — ABNORMAL HIGH (ref 70–99)
Potassium: 3.4 mmol/L — ABNORMAL LOW (ref 3.5–5.1)
Sodium: 132 mmol/L — ABNORMAL LOW (ref 135–145)

## 2022-07-08 NOTE — ED Triage Notes (Signed)
Pt presents to ER from home accompanied by husband who reports pt is weak and has not had an appetite for the past 2 days. Pt's husband also reports pt is a kidney transplant pt and only has one kidney. Pt talks in complete sentences no respiratory distress noted

## 2022-07-09 ENCOUNTER — Observation Stay
Admission: EM | Admit: 2022-07-09 | Discharge: 2022-07-11 | Disposition: A | Discharge status: Home Health | Payer: Medicare Other | Attending: Internal Medicine | Admitting: Internal Medicine

## 2022-07-09 ENCOUNTER — Inpatient Hospital Stay: Payer: Medicare Other

## 2022-07-09 ENCOUNTER — Encounter: Payer: Self-pay | Admitting: Family Medicine

## 2022-07-09 ENCOUNTER — Emergency Department: Payer: Medicare Other

## 2022-07-09 DIAGNOSIS — I10 Essential (primary) hypertension: Secondary | ICD-10-CM | POA: Diagnosis present

## 2022-07-09 DIAGNOSIS — E559 Vitamin D deficiency, unspecified: Secondary | ICD-10-CM | POA: Diagnosis present

## 2022-07-09 DIAGNOSIS — T8619 Other complication of kidney transplant: Secondary | ICD-10-CM | POA: Diagnosis present

## 2022-07-09 DIAGNOSIS — N261 Atrophy of kidney (terminal): Secondary | ICD-10-CM | POA: Diagnosis not present

## 2022-07-09 DIAGNOSIS — B961 Klebsiella pneumoniae [K. pneumoniae] as the cause of diseases classified elsewhere: Secondary | ICD-10-CM | POA: Diagnosis present

## 2022-07-09 DIAGNOSIS — N179 Acute kidney failure, unspecified: Secondary | ICD-10-CM | POA: Diagnosis present

## 2022-07-09 DIAGNOSIS — Z79890 Hormone replacement therapy (postmenopausal): Secondary | ICD-10-CM | POA: Diagnosis not present

## 2022-07-09 DIAGNOSIS — I120 Hypertensive chronic kidney disease with stage 5 chronic kidney disease or end stage renal disease: Secondary | ICD-10-CM | POA: Diagnosis present

## 2022-07-09 DIAGNOSIS — D849 Immunodeficiency, unspecified: Secondary | ICD-10-CM | POA: Diagnosis present

## 2022-07-09 DIAGNOSIS — N1831 Chronic kidney disease, stage 3a: Secondary | ICD-10-CM | POA: Diagnosis not present

## 2022-07-09 DIAGNOSIS — E1129 Type 2 diabetes mellitus with other diabetic kidney complication: Secondary | ICD-10-CM | POA: Diagnosis present

## 2022-07-09 DIAGNOSIS — W19XXXA Unspecified fall, initial encounter: Secondary | ICD-10-CM

## 2022-07-09 DIAGNOSIS — K5730 Diverticulosis of large intestine without perforation or abscess without bleeding: Secondary | ICD-10-CM | POA: Diagnosis not present

## 2022-07-09 DIAGNOSIS — A4159 Other Gram-negative sepsis: Secondary | ICD-10-CM | POA: Diagnosis present

## 2022-07-09 DIAGNOSIS — N3000 Acute cystitis without hematuria: Secondary | ICD-10-CM | POA: Diagnosis not present

## 2022-07-09 DIAGNOSIS — N390 Urinary tract infection, site not specified: Secondary | ICD-10-CM | POA: Diagnosis present

## 2022-07-09 DIAGNOSIS — M810 Age-related osteoporosis without current pathological fracture: Secondary | ICD-10-CM | POA: Diagnosis present

## 2022-07-09 DIAGNOSIS — D631 Anemia in chronic kidney disease: Secondary | ICD-10-CM | POA: Diagnosis not present

## 2022-07-09 DIAGNOSIS — E1122 Type 2 diabetes mellitus with diabetic chronic kidney disease: Secondary | ICD-10-CM | POA: Diagnosis present

## 2022-07-09 DIAGNOSIS — K219 Gastro-esophageal reflux disease without esophagitis: Secondary | ICD-10-CM | POA: Diagnosis present

## 2022-07-09 DIAGNOSIS — I129 Hypertensive chronic kidney disease with stage 1 through stage 4 chronic kidney disease, or unspecified chronic kidney disease: Secondary | ICD-10-CM | POA: Diagnosis not present

## 2022-07-09 DIAGNOSIS — Z79899 Other long term (current) drug therapy: Secondary | ICD-10-CM | POA: Diagnosis not present

## 2022-07-09 DIAGNOSIS — E785 Hyperlipidemia, unspecified: Secondary | ICD-10-CM | POA: Diagnosis not present

## 2022-07-09 DIAGNOSIS — Z940 Kidney transplant status: Secondary | ICD-10-CM | POA: Diagnosis not present

## 2022-07-09 DIAGNOSIS — Z992 Dependence on renal dialysis: Secondary | ICD-10-CM | POA: Diagnosis not present

## 2022-07-09 DIAGNOSIS — H409 Unspecified glaucoma: Secondary | ICD-10-CM | POA: Diagnosis present

## 2022-07-09 DIAGNOSIS — Z9851 Tubal ligation status: Secondary | ICD-10-CM | POA: Diagnosis not present

## 2022-07-09 DIAGNOSIS — E860 Dehydration: Secondary | ICD-10-CM | POA: Diagnosis present

## 2022-07-09 DIAGNOSIS — E876 Hypokalemia: Secondary | ICD-10-CM | POA: Diagnosis present

## 2022-07-09 DIAGNOSIS — Y92009 Unspecified place in unspecified non-institutional (private) residence as the place of occurrence of the external cause: Secondary | ICD-10-CM

## 2022-07-09 DIAGNOSIS — A419 Sepsis, unspecified organism: Secondary | ICD-10-CM | POA: Diagnosis present

## 2022-07-09 DIAGNOSIS — R159 Full incontinence of feces: Secondary | ICD-10-CM | POA: Diagnosis present

## 2022-07-09 DIAGNOSIS — M1990 Unspecified osteoarthritis, unspecified site: Secondary | ICD-10-CM | POA: Diagnosis present

## 2022-07-09 DIAGNOSIS — Z7982 Long term (current) use of aspirin: Secondary | ICD-10-CM | POA: Diagnosis not present

## 2022-07-09 DIAGNOSIS — Z87440 Personal history of urinary (tract) infections: Secondary | ICD-10-CM | POA: Diagnosis not present

## 2022-07-09 LAB — LACTIC ACID, PLASMA
Lactic Acid, Venous: 1.3 mmol/L (ref 0.5–1.9)
Lactic Acid, Venous: 1.3 mmol/L (ref 0.5–1.9)

## 2022-07-09 LAB — CBG MONITORING, ED
Glucose-Capillary: 110 mg/dL — ABNORMAL HIGH (ref 70–99)
Glucose-Capillary: 125 mg/dL — ABNORMAL HIGH (ref 70–99)
Glucose-Capillary: 154 mg/dL — ABNORMAL HIGH (ref 70–99)
Glucose-Capillary: 152 mg/dL — ABNORMAL HIGH (ref 70–99)

## 2022-07-09 LAB — PROCALCITONIN: Procalcitonin: 0.71 ng/mL

## 2022-07-09 LAB — MAGNESIUM: Magnesium: 1.9 mg/dL (ref 1.7–2.4)

## 2022-07-09 LAB — GLUCOSE, CAPILLARY: Glucose-Capillary: 129 mg/dL — ABNORMAL HIGH (ref 70–99)

## 2022-07-09 MED ORDER — ENOXAPARIN SODIUM 30 MG/0.3ML IJ SOSY
30.0000 mg | PREFILLED_SYRINGE | INTRAMUSCULAR | Status: DC
  Administered 2022-07-09: 30 mg via SUBCUTANEOUS
  Filled 2022-07-09 (×2): qty 0.3

## 2022-07-09 MED ORDER — TRAZODONE HCL 50 MG PO TABS: 25.0000 mg | ORAL_TABLET | Freq: Every evening | ORAL | Status: DC | PRN

## 2022-07-09 MED ORDER — POTASSIUM CHLORIDE CRYS ER 20 MEQ PO TBCR
20.0000 meq | EXTENDED_RELEASE_TABLET | Freq: Once | ORAL | Status: AC
  Administered 2022-07-09: 20 meq via ORAL
  Filled 2022-07-09: qty 1

## 2022-07-09 MED ORDER — MAGNESIUM HYDROXIDE 400 MG/5ML PO SUSP: 30.0000 mL | Freq: Every day | ORAL | Status: DC | PRN

## 2022-07-09 MED ORDER — ACETAMINOPHEN 325 MG PO TABS: 650.0000 mg | ORAL_TABLET | Freq: Four times a day (QID) | ORAL | Status: DC | PRN

## 2022-07-09 MED ORDER — AMLODIPINE BESYLATE 5 MG PO TABS
2.5000 mg | ORAL_TABLET | Freq: Every day | ORAL | Status: DC
  Administered 2022-07-09 – 2022-07-11 (×3): 2.5 mg via ORAL
  Filled 2022-07-09 (×3): qty 1

## 2022-07-09 MED ORDER — POTASSIUM CHLORIDE IN NACL 20-0.9 MEQ/L-% IV SOLN: INTRAVENOUS | Status: DC

## 2022-07-09 MED ORDER — VITAMIN D 25 MCG (1000 UNIT) PO TABS
2000.0000 [IU] | ORAL_TABLET | Freq: Every day | ORAL | Status: DC
  Administered 2022-07-09 – 2022-07-11 (×3): 2000 [IU] via ORAL
  Filled 2022-07-09 (×3): qty 2

## 2022-07-09 MED ORDER — ACETAMINOPHEN 650 MG RE SUPP: 650.0000 mg | Freq: Four times a day (QID) | RECTAL | Status: DC | PRN

## 2022-07-09 MED ORDER — ADULT MULTIVITAMIN W/MINERALS CH
1.0000 | ORAL_TABLET | Freq: Every day | ORAL | Status: DC
  Administered 2022-07-09 – 2022-07-11 (×3): 1 via ORAL
  Filled 2022-07-09 (×3): qty 1

## 2022-07-09 MED ORDER — SODIUM CHLORIDE 0.9 % IV SOLN: INTRAVENOUS | Status: DC

## 2022-07-09 MED ORDER — DORZOLAMIDE HCL-TIMOLOL MAL 2-0.5 % OP SOLN
1.0000 [drp] | Freq: Three times a day (TID) | OPHTHALMIC | Status: DC
  Administered 2022-07-10 – 2022-07-11 (×3): 1 [drp] via OPHTHALMIC
  Filled 2022-07-09: qty 10

## 2022-07-09 MED ORDER — SODIUM CHLORIDE 0.9 % IV SOLN
1.0000 g | INTRAVENOUS | Status: AC
  Administered 2022-07-09: 1 g via INTRAVENOUS
  Filled 2022-07-09: qty 10

## 2022-07-09 MED ORDER — HYDRALAZINE HCL 20 MG/ML IJ SOLN
5.0000 mg | INTRAMUSCULAR | Status: DC | PRN
  Filled 2022-07-09: qty 1

## 2022-07-09 MED ORDER — ONDANSETRON HCL 4 MG PO TABS: 4.0000 mg | ORAL_TABLET | Freq: Four times a day (QID) | ORAL | Status: DC | PRN

## 2022-07-09 MED ORDER — MELATONIN 5 MG PO TABS: 5.0000 mg | ORAL_TABLET | Freq: Every evening | ORAL | Status: DC | PRN

## 2022-07-09 MED ORDER — INSULIN ASPART 100 UNIT/ML IJ SOLN
0.0000 [IU] | Freq: Three times a day (TID) | INTRAMUSCULAR | Status: DC
  Administered 2022-07-09: 2 [IU] via SUBCUTANEOUS
  Administered 2022-07-10: 3 [IU] via SUBCUTANEOUS
  Filled 2022-07-09 (×3): qty 1

## 2022-07-09 MED ORDER — MYCOPHENOLATE MOFETIL 250 MG PO CAPS
500.0000 mg | ORAL_CAPSULE | Freq: Two times a day (BID) | ORAL | Status: DC
  Administered 2022-07-09 – 2022-07-11 (×5): 500 mg via ORAL
  Filled 2022-07-09 (×5): qty 2

## 2022-07-09 MED ORDER — SODIUM CHLORIDE 0.9 % IV SOLN
2.0000 g | INTRAVENOUS | Status: DC
  Administered 2022-07-09 – 2022-07-10 (×2): 2 g via INTRAVENOUS
  Filled 2022-07-09: qty 20
  Filled 2022-07-09: qty 2

## 2022-07-09 MED ORDER — TACROLIMUS 0.5 MG PO CAPS
0.5000 mg | ORAL_CAPSULE | Freq: Every day | ORAL | Status: DC
  Administered 2022-07-09 – 2022-07-10 (×2): 0.5 mg via ORAL
  Filled 2022-07-09 (×2): qty 1

## 2022-07-09 MED ORDER — IOHEXOL 9 MG/ML PO SOLN: 500.0000 mL | ORAL | Status: AC

## 2022-07-09 MED ORDER — TACROLIMUS 0.5 MG PO CAPS: 0.5000 mg | ORAL_CAPSULE | Freq: Two times a day (BID) | ORAL | Status: DC

## 2022-07-09 MED ORDER — ONDANSETRON HCL 4 MG/2ML IJ SOLN: 4.0000 mg | Freq: Four times a day (QID) | INTRAMUSCULAR | Status: DC | PRN

## 2022-07-09 MED ORDER — PANTOPRAZOLE SODIUM 20 MG PO TBEC
20.0000 mg | DELAYED_RELEASE_TABLET | Freq: Every day | ORAL | Status: DC
  Administered 2022-07-09 – 2022-07-11 (×3): 20 mg via ORAL
  Filled 2022-07-09 (×3): qty 1

## 2022-07-09 MED ORDER — HYDROCODONE-ACETAMINOPHEN 5-325 MG PO TABS: 1.0000 | ORAL_TABLET | Freq: Four times a day (QID) | ORAL | Status: DC | PRN

## 2022-07-09 MED ORDER — PRAVASTATIN SODIUM 20 MG PO TABS
80.0000 mg | ORAL_TABLET | Freq: Every day | ORAL | Status: DC
  Administered 2022-07-09 – 2022-07-10 (×2): 80 mg via ORAL
  Filled 2022-07-09 (×2): qty 4

## 2022-07-09 MED ORDER — CARVEDILOL 12.5 MG PO TABS
12.5000 mg | ORAL_TABLET | Freq: Two times a day (BID) | ORAL | Status: DC
  Administered 2022-07-09 – 2022-07-11 (×5): 12.5 mg via ORAL
  Filled 2022-07-09 (×2): qty 1
  Filled 2022-07-09: qty 2
  Filled 2022-07-09 (×2): qty 1

## 2022-07-09 MED ORDER — KETOROLAC TROMETHAMINE 0.5 % OP SOLN
1.0000 [drp] | Freq: Four times a day (QID) | OPHTHALMIC | Status: DC
  Administered 2022-07-10 – 2022-07-11 (×4): 1 [drp] via OPHTHALMIC
  Filled 2022-07-09: qty 3

## 2022-07-09 MED ORDER — LACTATED RINGERS IV BOLUS
1000.0000 mL | Freq: Once | INTRAVENOUS | Status: AC
  Administered 2022-07-09: 1000 mL via INTRAVENOUS

## 2022-07-09 MED ORDER — LATANOPROST 0.005 % OP SOLN
1.0000 [drp] | Freq: Every day | OPHTHALMIC | Status: DC
  Administered 2022-07-10: 1 [drp] via OPHTHALMIC
  Filled 2022-07-09: qty 2.5

## 2022-07-09 MED ORDER — ASPIRIN 81 MG PO TBEC
81.0000 mg | DELAYED_RELEASE_TABLET | Freq: Every day | ORAL | Status: DC
  Administered 2022-07-09 – 2022-07-11 (×3): 81 mg via ORAL
  Filled 2022-07-09 (×3): qty 1

## 2022-07-09 MED ORDER — MYCOPHENOLATE SODIUM 180 MG PO TBEC: 360.0000 mg | DELAYED_RELEASE_TABLET | Freq: Two times a day (BID) | ORAL | Status: DC

## 2022-07-09 MED ORDER — INSULIN ASPART 100 UNIT/ML IJ SOLN: 0.0000 [IU] | Freq: Every day | INTRAMUSCULAR | Status: DC

## 2022-07-09 MED ORDER — TACROLIMUS 1 MG PO CAPS
1.0000 mg | ORAL_CAPSULE | Freq: Every day | ORAL | Status: DC
  Administered 2022-07-09 – 2022-07-11 (×3): 1 mg via ORAL
  Filled 2022-07-09 (×3): qty 1

## 2022-07-09 NOTE — Assessment & Plan Note (Signed)
Hemoglobin 12.9 on 06/23/2022 --> 11.8, slightly dropped, no active bleeding. -Follow-up with CBC

## 2022-07-09 NOTE — Assessment & Plan Note (Signed)
Sepsis due to possible UTI: Urinalysis is positive for UTI, but with squamous cell contamination.  Patient meets criteria for sepsis with WBC 13.0, RR 29.  Since patient has worsening renal function and history of kidney transplant, IV Rocephin was started in ED, which is reasonable. -Continue IV Rocephin -Follow-up blood culture and urine culture -Check lactic acid level and procalcitonin level

## 2022-07-09 NOTE — ED Provider Notes (Signed)
[facility] Provider Note    Event Date/Time   First MD Initiated Contact with Patient 07/09/22 0037     (approximate)   History   Weakness and Fall   HPI  Kristen Kirk is a 71 y.o. female whose medical history is notable for renal transplant about 5 years ago followed at [facility].  She presents tonight for several days of not feeling well and self reporting that she has not been eating and drinking like she should.  She is not having any pain and has had no fevers.  She has had increased urinary frequency but no pain when she urinates.  However she was feeling generalized weakness and fatigue enough tonight that she thought she should get checked out.  She denies chest pain, shortness of breath, nausea, vomiting, and diarrhea.  Of note, she recently had surgery by Dr. Leighton Ruff (insertion of a sacral nerve stimulator for fecal incontinence).  This was a little over a week ago.  She has not felt very well since that time.  The patient reports that she goes to [facility] for her renal care and that is also where she had her kidney transplant.  She does not remember the name of her kidney doctor but confirmed that the surgery was more than 5 years ago.  She has been compliant with all of her immunosuppressive medications.     Physical Exam   Triage Vital Signs: ED Triage Vitals  Enc Vitals Group     BP 07/08/22 2101 110/75     Pulse Rate 07/08/22 2101 75     Resp 07/08/22 2101 16     Temp 07/08/22 2101 98.4 F (36.9 C)     Temp Source 07/08/22 2101 Oral     SpO2 07/08/22 2101 96 %     Weight 07/08/22 2102 59 kg (130 lb)     Height 07/08/22 2102 1.6 m (5\' 3" )     Head Circumference --      Peak Flow --      Pain Score 07/08/22 2102 0     Pain Loc --      Pain Edu? --      Excl. in [place]? --     Most recent vital signs: Vitals:   07/09/22 0315 07/09/22 0345  BP:  (!) 152/74  Pulse: 75 78  Resp: (!) 29 (!) 24  Temp:    SpO2: 100% 100%      General: Awake, no distress.  Appears somewhat older than chronological age. CV:  Good peripheral perfusion.  Normal heart sounds.  Regular rate and rhythm. Resp:  Normal effort.  Lungs are clear to auscultation bilaterally. Abd:  No distention.  No tenderness to palpation of the abdomen.  No tenderness to percussion of the flanks. 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   ED Results / Procedures / Treatments   Labs (all labs ordered are listed, but only abnormal results are displayed) Labs Reviewed  BASIC METABOLIC PANEL - Abnormal; Notable for the following components:      Result Value   Sodium 132 (*)    Potassium 3.4 (*)    CO2 21 (*)    Glucose, Bld 239 (*)    BUN 37 (*)    Creatinine, Ser 1.67 (*)    GFR, Estimated 33 (*)  All other components within normal limits  CBC - Abnormal; Notable for the following components:   WBC 13.0 (*)    Hemoglobin 11.8 (*)    All other components within normal limits  URINALYSIS, ROUTINE W REFLEX MICROSCOPIC - Abnormal; Notable for the following components:   Color, Urine AMBER (*)    APPearance CLOUDY (*)    Hgb urine dipstick SMALL (*)    Protein, ur 30 (*)    Leukocytes,Ua LARGE (*)    WBC, UA >50 (*)    Bacteria, UA MANY (*)    Non Squamous Epithelial PRESENT (*)    All other components within normal limits  URINE CULTURE  CULTURE, BLOOD (ROUTINE X 2)  CULTURE, BLOOD (ROUTINE X 2)  C DIFFICILE QUICK SCREEN W PCR REFLEX    GASTROINTESTINAL PANEL BY PCR, STOOL (REPLACES STOOL CULTURE)  CBG MONITORING, ED     EKG  ED ECG REPORT I, Hinda Kehr, the attending physician, personally viewed and interpreted this ECG.  Date: 07/08/2022 EKG Time: 21: 02 Rate: 77 Rhythm: normal sinus rhythm QRS Axis: Left axis deviation Intervals: normal with LVH ST/T Wave  abnormalities: Non-specific ST segment / T-wave changes, but no clear evidence of acute ischemia. Narrative Interpretation: no definitive evidence of acute ischemia; does not meet STEMI criteria.    RADIOLOGY As documented in hospital course, there is no evidence of acute abnormality on the patient's CT abdomen/pelvis without IV contrast but with oral contrast.    PROCEDURES:  Critical Care performed: No  Procedures   MEDICATIONS ORDERED IN ED: Medications  iohexol (OMNIPAQUE) 9 MG/ML oral solution 500 mL (has no administration in time range)  lactated ringers bolus 1,000 mL (0 mLs Intravenous Stopped 07/09/22 0332)  cefTRIAXone (ROCEPHIN) 1 g in sodium chloride 0.9 % 100 mL IVPB (0 g Intravenous Stopped 07/09/22 0332)     IMPRESSION / MDM / ASSESSMENT AND PLAN / ED COURSE  I reviewed the triage vital signs and the nursing notes.                              Differential diagnosis includes, but is not limited to, acute kidney injury, issue with transplant, UTI, urinary obstruction, renal/ureteral stone, infected stone, electrolyte or metabolic abnormality, recent surgery complication.  Patient's presentation is most consistent with acute presentation with potential threat to life or bodily function.  Vital signs are stable and within normal limits.  Labs/studies ordered: Urinalysis, urine culture, basic metabolic panel, CBC.  Patient is immunocompromise to 5 years status post renal transplant and she has a creatinine of 1.67, which is a significant increase from her baseline of right around 1 as recently as 10 days ago.  This is concerning given the drop of her GFR down to 33, particularly in the setting of a renal transplant.  She admits that she has not been eating and drinking as much as usual and has not been feeling well.  Additionally, she has what appears to be a UTI on her urinalysis with large leukocytes, many bacteria, and greater than 50 WBCs.  I ordered 2 blood  cultures as well given her immunocompromise state, but is notable that she does not meet SIRS/sepsis criteria given that she has a leukocytosis of 13 but does not otherwise have another criterion.  I am treating empirically with ceftriaxone 1 g IV while the urine and blood cultures are pending.  I also ordered LR 1 L IV bolus to  begin rehydration.  No significant electrolyte abnormalities that need to be addressed at this moment.  I reviewed the medical record including the operative note by Dr. Marcello Moores on 06/29/2022 which mentioned the placement of the sacral nerve stimulator for fecal incontinence.  Given that she has had surgery recently and has not been feeling well since then, even though it seems unrelated, I think it is reasonable to order a CT abdomen/pelvis without IV contrast but with oral contrast.  This should help [place] identify any renal/ureteral obstructive issues as well as to rule out bowel or postoperative issues that could also be contributing to her current presentation.  Regardless, given her immunosuppression, kidney transplant, acute kidney injury/acute renal failure, and UTI, she will need admission to the hospital.  However I will wait to consult the hospitalist until after the CT comes back and indicates that she has no acute surgical issues.  I talked with the patient and her husband about all of this and they understand and agree with the plan.  The patient is on the cardiac monitor to evaluate for evidence of arrhythmia and/or significant heart rate changes.   Clinical Course as of 07/09/22 0426  Sun Jul 09, 2022  0414 CT ABDOMEN PELVIS WO CONTRAST I viewed and interpreted the patient's CT abdomen pelvis without IV contrast.  I see no evidence of SBO or ileus or other acute intra-abdominal or postsurgical infection.  Radiologist also commented on no acute abnormalities. [CF]  [phone] Patient had a large volume of liquid stool.  Even though it is unlikely she has a pathogenic  diarrhea, and she has a known history of fecal incontinence, I ordered C. difficile screening and GI pathogen panel given the possibility she may have untreated GI pathogen.  I consulted with the hospitalist for admission for the reasons as previously described. [CF]  0425 Discussed case by secure chat text with Dr. Sidney Ace with the hospitalist service who will admit.  I also sent an in basket message to Dr. Murlean Iba to be delivered at 6 AM to let him know about the patient and need for consultation. [CF]    Clinical Course User Index [CF] Hinda Kehr, MD     FINAL CLINICAL IMPRESSION(S) / ED DIAGNOSES   Final diagnoses:  Acute kidney injury ([place])  Urinary tract infection without hematuria, site unspecified  History of renal transplant  Immunocompromised state ([place])  Incontinence of feces, unspecified fecal incontinence type     Rx / DC Orders   ED Discharge Orders     None        Note:  This document was prepared using Dragon voice recognition software and may include unintentional dictation errors.   Hinda Kehr, MD 07/09/22 [phone]

## 2022-07-09 NOTE — Assessment & Plan Note (Addendum)
Pt is following up with renal at [facility] - continue home doe of Prograf and  Myfortic - check tacrolimus level - renal US is ordered by EDP - Dr. Candiss Norse of renal is consulted

## 2022-07-09 NOTE — ED Notes (Signed)
Pt walks with steady gate with walker with PT.

## 2022-07-09 NOTE — Evaluation (Signed)
Physical Therapy Evaluation Patient Details Name: SHETARA LAUNER MRN: [id number] DOB: Sep 15, 1951 Today's Date: 07/09/2022  History of Present Illness  Pt is a 71 y.o. 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   Clinical Impression  Patient alert, oriented to self, place, able to answer some PLOF, husband in room at end of session to verify some of the information. Per pt she is ambulatory without RW, did report at least 1 fall. Stated she is independent for ADLs.  The patient performed bed mobility modI. Sit <> stand without RW, CGA. Some general unsteadiness noted. Pt provided with RW and improved to supervision and endorsed feeling "better" with walker. She was able to ambulate ~151ft with RW and supervision, no LOB. Did endorse feeling a bit "whoozy" but HR, BP, and spO2 WFLs. Returned to supine with all needs in reach, pt encouraged to eat and set up for lunch.  Overall the patient demonstrated deficits (see "PT Problem List") that impede the patient's functional abilities, safety, and mobility and would benefit from skilled PT intervention. Recommendation is HHPT to maximize function, mobility, and decrease risk of falls.        Recommendations for follow up therapy are one component of a multi-disciplinary discharge planning process, led by the attending physician.  Recommendations may be updated based on patient status, additional functional criteria and insurance authorization.  Follow Up Recommendations Home health PT      Assistance Recommended at Discharge Frequent or constant Supervision/Assistance  Patient can return home with the following  A little help with walking and/or transfers;A little help with  bathing/dressing/bathroom;Assistance with cooking/housework;Assistance with feeding;Help with stairs or ramp for entrance    Equipment Recommendations None recommended by PT (pt has RW at home per husband)  Recommendations for Other Services       Functional Status Assessment Patient has had a recent decline in their functional status and demonstrates the ability to make significant improvements in function in a reasonable and predictable amount of time.     Precautions / Restrictions Precautions Precautions: Fall Restrictions Weight Bearing Restrictions: No      Mobility  Bed Mobility Overal bed mobility: Modified Independent                  Transfers Overall transfer level: Needs assistance Equipment used: Rolling walker (2 wheels), None Transfers: Sit to/from Stand Sit to Stand: Min guard, Supervision           General transfer comment: without RW CGA, supervision    Ambulation/Gait Ambulation/Gait assistance: Supervision Gait Distance (Feet): 100 Feet Assistive device: Rolling walker (2 wheels)         General Gait Details: no LOB  Stairs            Wheelchair Mobility    Modified Rankin (Stroke Patients Only)       Balance Overall balance assessment: Needs assistance Sitting-balance support: Feet supported Sitting balance-Leahy Scale: Good       Standing balance-Leahy Scale: Fair Standing balance comment: improved steadiness with RW                             Pertinent Vitals/Pain Pain Assessment Pain Assessment: No/denies pain    Home Living Family/patient expects to be discharged to:: Private residence Living Arrangements: Spouse/significant other Available Help  at Discharge: Family Type of Home: House Home Access: Stairs to enter Entrance Stairs-Rails: Psychiatric nurse of Steps: 5   Home Layout: Two level;Able to live on main level with bedroom/bathroom Home Equipment: Rolling Walker (2  wheels) Additional Comments: pt did report at least 1 fall    Prior Function Prior Level of Function : Independent/Modified Independent             Mobility Comments: per pt Independent for mobility ADLs Comments: per pt I for ADLs     Hand Dominance        Extremity/Trunk Assessment   Upper Extremity Assessment Upper Extremity Assessment: Overall WFL for tasks assessed    Lower Extremity Assessment Lower Extremity Assessment: Generalized weakness    Cervical / Trunk Assessment Cervical / Trunk Assessment: Normal  Communication   Communication: No difficulties  Cognition Arousal/Alertness: Awake/alert Behavior During Therapy: WFL for tasks assessed/performed Overall Cognitive Status: Within Functional Limits for tasks assessed                                          General Comments      Exercises     Assessment/Plan    PT Assessment Patient needs continued PT services  PT Problem List Decreased strength;Decreased mobility;Decreased activity tolerance;Decreased balance       PT Treatment Interventions DME instruction;Therapeutic exercise;Gait training;Balance training;Stair training;Neuromuscular re-education;Functional mobility training;Therapeutic activities;Patient/family education    PT Goals (Current goals can be found in the Care Plan section)  Acute Rehab PT Goals Patient Stated Goal: to get stronger PT Goal Formulation: With patient/family Time For Goal Achievement: 07/23/22 Potential to Achieve Goals: Good    Frequency Min 2X/week     Co-evaluation               AM-PAC PT "6 Clicks" Mobility  Outcome Measure Help needed turning from your back to your side while in a flat bed without using bedrails?: None Help needed moving from lying on your back to sitting on the side of a flat bed without using bedrails?: None Help needed moving to and from a bed to a chair (including a wheelchair)?: None Help needed standing up  from a chair using your arms (e.g., wheelchair or bedside chair)?: None Help needed to walk in hospital room?: None Help needed climbing 3-5 steps with a railing? : A Little 6 Click Score: 23    End of Session Equipment Utilized During Treatment: Gait belt Activity Tolerance: Patient tolerated treatment well Patient left: in bed;with bed alarm set Nurse Communication: Mobility status PT Visit Diagnosis: Other abnormalities of gait and mobility (R26.89);Difficulty in walking, not elsewhere classified (R26.2);Muscle weakness (generalized) (M62.81)    Time: 7893-8101 PT Time Calculation (min) (ACUTE ONLY): 22 min   Charges:   PT Evaluation $PT Eval Low Complexity: 1 Low PT Treatments $Therapeutic Activity: 8-22 mins        Lieutenant Diego PT, DPT 3:16 PM,07/09/22

## 2022-07-09 NOTE — Assessment & Plan Note (Signed)
Pravastatin  

## 2022-07-09 NOTE — H&P (Signed)
History and Physical    Kristen Kirk FYB:[id number] DOB: Nov 23, 1950 DOA: 07/09/2022  Referring MD/NP/PA:   PCP: Ria Bush, MD   Patient coming from:  The patient is coming from home.    Chief Complaint: weakness  HPI: Kristen Kirk is a 71 y.o. 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  Patient has memory impairment, but still can provide reasonable medical history though slightly limited.  Patient states that she has not been feeling well for more than 3 days.  She has poor appetite, decreased oral intake.  She has mild nausea, and vomited once 2 days ago, currently no vomiting or abdominal pain.  Patient denies diarrhea, but per report, patient had a massive bowel movement in the ED after drinking oral contrast.  No fever or chills.  Patient does not have chest pain, cough, shortness of breath.  Denies symptoms of UTI.  Patient moves all extremities normally.  No facial droop or slurred speech.  Patient states that she had slowly rolled out of the bed 3 days ago without any injury.  No loss of consciousness.  Of note, she recently had surgery by Dr. Leighton Ruff (insertion of a sacral nerve stimulator for fecal incontinence).  This was a little over a week ago.   Data reviewed independently and ED Course: pt was found to have worsening renal function with creatinine 1.67, BUN 37, GFR 33 (recent baseline creatinine 1.0, GFR 45 on 06/29/2022), WBC 13.0, urinalysis (amber appearance, large amount of leukocyte, many bacteria, WBC> 50, squamous cell 11-20), potassium 3.4, temperature normal, blood pressure 146/69, heart rate 82, RR 29, oxygen saturation 100% on room air.  CT scan of abdomen/pelvis is negative for acute issues.  Patient is admitted to  [facility] bed as inpatient.  Dr. Candiss Norse of renal was consulted   CT-abd/pelvis: 1. No acute abnormality is seen with oral contrast only. 2. Transplanted kidney right iliac fossa without evidence of hydronephrosis or stone. 3. Sigmoid diverticulosis without inflammatory changes. 4. Small hiatal hernia with small umbilical and inguinal fat hernias. 5. Aortic and coronary artery atherosclerosis. 6. Severe acquired spinal stenosis at L4-5 due to advanced facet hypertrophy, ligamentous thickening and degenerative spondylolisthesis.    EKG: I have personally reviewed.  Sinus rhythm, QTc 420, LAE, LAD, poor R wave progression   Review of Systems:   General: no fevers, chills, no body weight gain, has poor appetite, has fatigue HEENT: no blurry vision, hearing changes or sore throat Respiratory: no dyspnea, coughing, wheezing CV: no chest pain, no palpitations GI: has nausea, currently no vomiting, abdominal pain, constipation GU: no dysuria, burning on urination, increased urinary frequency, hematuria  Ext: no leg edema Neuro: no unilateral weakness, numbness, or tingling, no vision change or hearing loss Skin: no rash, no skin tear. MSK: No muscle spasm, no deformity, no limitation of range of movement in spin Heme: No easy bruising.  Travel history: No recent long distant travel.   Allergy:  Allergies  Allergen Reactions   Brimonidine     Other reaction(s): Other (See Comments) eye redness   Shellfish-Derived Products Swelling   Tramadol Other (See Comments)    Dizziness even at 1/2 tab    Past Medical History:  Diagnosis Date   Anemia in chronic kidney disease    a. s/p aranesp 11/2013 now starting epo with HD 06/2014   Diabetes mellitus  type II    a. Currently diet controlled since losing weight.   DJD (degenerative joint disease)    Essential hypertension    GERD (gastroesophageal reflux disease)    Glaucoma, both eyes    a. L>R   History of echocardiogram    a.  01/2015 [facility]): EF ?55%, triv MR, mildly dil LA, nl PV.   History of end stage renal disease    previous on HD;   received kidney transplant 07/ 2018 (on dialysis since 2015 MWF)  followed by [facility] renal (Dr. Daphane Shepherd) considering transplant Access: LUE fistula Establishing with [place] [facility]   History of stress test    a. 03/2013 [facility]): EF 63%, no ischemia.   History of syncope    a. 12/2015.   History of Urge Incontinence    HLD (hyperlipidemia)    Osteoarthritis    Osteoporosis 05/03/2019   DEXA 04/2019 - 1.5 spine, -2.1 hip, -2.5 forearm Check phosphate and PTH to guide further management in h/o kidney transplant   S/P kidney transplant 04/2017   _0 ---   per pt one kidney from living-donor   Vaginal atrophy    a. vagifem caused spotting, normal TVS   Vitamin D deficiency    Wears glasses    stated on 05/31/2022   Wears partial dentures    upper 2 teeth. 05/31/2022    Past Surgical History:  Procedure Laterality Date   ANAL RECTAL MANOMETRY N/A 03/16/2021   Procedure: ANO RECTAL MANOMETRY;  Surgeon: Mauri Pole, MD;  Location: [facility];  Service: Endoscopy;  Laterality: N/A;   BLADDER SUSPENSION  2011   for stress incontinence-Dr. MacDiarmid   BREAST BIOPSY Right 12/15/2020   fat necrosis   CATARACT EXTRACTION W/ INTRAOCULAR LENS IMPLANT Bilateral    left 02-25-2021;  right ?   COLONOSCOPY  03/2019   diverticulosis, rpt 10 yrs (Nandigam)   KIDNEY TRANSPLANT Right 04/03/2017   [facility])   left eye laser surgery  06/2011   LIGATION OF ARTERIOVENOUS  FISTULA  10/2017   thrombosed L arm AFV   NEPHRECTOMY TRANSPLANTED ORGAN     REFRACTIVE SURGERY     TUBAL LIGATION Bilateral    1990s    Social History:  reports that she quit smoking about 36 years ago. Her smoking use included cigarettes. She has never used smokeless tobacco. She reports that she does not drink alcohol and does not use drugs.  Family History:  Family History  Problem  Relation Age of Onset   Other Father        she did not know her father.   Diabetes Mother    Heart failure Mother        died @ 52   Stroke Brother    Hyperlipidemia Brother    Hypertension Brother    Hyperlipidemia Brother    Hypertension Brother    Hyperlipidemia Sister    Hypertension Sister    Hyperlipidemia Sister    Hypertension Sister    Heart failure Maternal Grandfather    Breast cancer Neg Hx    Colon cancer Neg Hx    Esophageal cancer Neg Hx    Stomach cancer Neg Hx    Rectal cancer Neg Hx      Prior to Admission medications   Medication Sig Start Date End Date Taking? Authorizing Provider  aspirin 81 MG tablet Take 81 mg by mouth daily.   Yes [provider]  carvedilol (COREG) 25 MG tablet Take 12.5 mg  by mouth 2 (two) times daily with a meal. 07/02/18  Yes Ria Bush, MD  Cholecalciferol (VITAMIN D3) 1000 units CAPS Take 2 capsules (2,000 Units total) by mouth daily. 07/02/18  Yes Ria Bush, MD  dorzolamide-timolol (COSOPT) 22.3-6.8 MG/ML ophthalmic solution Place 1 drop into both eyes 3 (three) times daily.   Yes [provider]  famotidine (PEPCID) 20 MG tablet Take 1 tablet (20 mg total) by mouth 2 (two) times daily. 07/02/18  Yes Ria Bush, MD  hydrochlorothiazide (HYDRODIURIL) 12.5 MG tablet Take 1 tablet (12.5 mg total) by mouth daily. 11/15/20  Yes Ria Bush, MD  ketorolac (ACULAR) 0.5 % ophthalmic solution Place 1 drop into the left eye 4 (four) times daily. 07/05/22  Yes [provider]  latanoprost (XALATAN) 0.005 % ophthalmic solution Place 1 drop into the right eye at bedtime. 06/23/22  Yes [provider]  Multiple Vitamin (MULTIVITAMIN) tablet Take 1 tablet by mouth daily.   Yes [provider]  pantoprazole (PROTONIX) 20 MG tablet TAKE 1 TABLET BY MOUTH EVERY DAY 07/03/22  Yes Ria Bush, MD  pravastatin (PRAVACHOL) 80 MG tablet TAKE 1 TABLET BY MOUTH  EVERY EVENING Patient  taking differently: Take 80 mg by mouth daily. 01/13/19  Yes Ria Bush, MD  prednisoLONE acetate (PRED FORTE) 1 % ophthalmic suspension Place 1 drop into the left eye 4 (four) times daily. 03/26/22  Yes [provider]  Wheat Dextrin (BENEFIBER DRINK MIX PO) Take by mouth 2 (two) times daily.   Yes [provider]  alendronate (FOSAMAX) 70 MG tablet Take 1 tablet (70 mg total) by mouth once a week. Take with a full glass of water on an empty stomach. Patient not taking: Reported on 07/09/2022 04/14/22   Ria Bush, MD  alendronate-cholecalciferol (FOSAMAX PLUS D) [phone] MG-UNIT tablet Take 1 tablet by mouth every 7 (seven) days. Take with a full glass of water on an empty stomach.    [provider]  amLODipine (NORVASC) 2.5 MG tablet Take 2.5 mg by mouth daily. 10/05/21   [provider]  apraclonidine (IOPIDINE) 0.5 % ophthalmic solution Place 1 drop into the left eye 2 (two) times daily. Patient not taking: Reported on 07/09/2022 05/28/20   [provider]  Blood Glucose Monitoring Suppl (ONE TOUCH ULTRA SYSTEM KIT) W/DEVICE KIT 1 kit by Does not apply route once. 03/02/14   Ria Bush, MD  docusate sodium (COLACE) 100 MG capsule Take 100 mg by mouth 2 (two) times daily as needed. 07/02/18   Ria Bush, MD  glucose blood test strip Check sugars once daily 04/21/11   Ria Bush, MD  HYDROcodone-acetaminophen (NORCO/VICODIN) 5-325 MG tablet Take 1 tablet by mouth every 6 (six) hours as needed. 7/61/60   Leighton Ruff, MD  Melatonin 3 MG CAPS Take 1-2 capsules (3-6 mg total) by mouth at bedtime as needed (sleep). 07/02/18   Ria Bush, MD  mirabegron ER (MYRBETRIQ) 25 MG TB24 tablet Take 25 mg by mouth daily. Patient not taking: Reported on 07/09/2022    [provider]  mycophenolate (MYFORTIC) 180 MG EC tablet Take 500 mg by mouth 2 (two) times daily. Patient not taking: Reported on 07/09/2022 07/02/18   Ria Bush, MD  tacrolimus (PROGRAF) 0.5 MG capsule Take 0.5 mg by mouth 2 (two) times daily. 2 tabs in am and 1 at night 07/02/18   Ria Bush, MD  tiZANidine (ZANAFLEX) 2 MG tablet Take 1 tablet (2 mg total) by mouth 2 (two) times daily as  needed for muscle spasms (sedation precautions). Patient not taking: Reported on 07/09/2022 04/20/21   Ria Bush, MD  travoprost, benzalkonium, (TRAVATAN) 0.004 % ophthalmic solution Place 1 drop into both eyes at bedtime. Patient not taking: Reported on 07/09/2022    [provider]    Physical Exam: Vitals:   07/09/22 0500 07/09/22 0530 07/09/22 0600 07/09/22 0830  BP: (!) 155/62 (!) 158/67 (!) 146/69 99/62  Pulse: 82 74 74 74  Resp: (!) 24 (!) 24 (!) 25 16  Temp:    98.6 F (37 C)  TempSrc:    Oral  SpO2: 98% 98% 100% 100%  Weight:      Height:       General: Not in acute distress.  Dry mucosal membranes HEENT:       Eyes: PERRL, EOMI, no scleral icterus.       ENT: No discharge from the ears and nose, no pharynx injection, no tonsillar enlargement.        Neck: No JVD, no bruit, no mass felt. Heme: No neck lymph node enlargement. Cardiac: S1/S2, RRR, No murmurs, No gallops or rubs. Respiratory: No rales, wheezing, rhonchi or rubs. GI: Soft, nondistended, nontender, no rebound pain, no organomegaly, BS present. GU: No hematuria Ext: No pitting leg edema bilaterally. 1+DP/PT pulse bilaterally. Musculoskeletal: No joint deformities, No joint redness or warmth, no limitation of ROM in spin. Skin: No rashes.  Neuro: Alert, oriented X3, cranial nerves II-XII grossly intact, moves all extremities normally.  Psych: Patient is not psychotic, no suicidal or hemocidal ideation.  Labs on Admission: I have personally reviewed following labs and imaging studies  CBC: Recent Labs  Lab 07/08/22 2107  WBC 13.0*  HGB 11.8*  HCT 38.1  MCV 83.9  PLT 409   Basic Metabolic Panel: Recent Labs  Lab 07/08/22 2107 07/08/22 2109  NA  132*  --   K 3.4*  --   CL 100  --   CO2 21*  --   GLUCOSE 239*  --   BUN 37*  --   CREATININE 1.67*  --   CALCIUM 9.2  --   MG  --  1.9   GFR: Estimated Creatinine Clearance: 26.3 mL/min (A) (by C-G formula based on SCr of 1.67 mg/dL (H)). Liver Function Tests: No results for input(s): "AST", "ALT", "ALKPHOS", "BILITOT", "PROT", "ALBUMIN" in the last 168 hours. No results for input(s): "LIPASE", "AMYLASE" in the last 168 hours. No results for input(s): "AMMONIA" in the last 168 hours. Coagulation Profile: No results for input(s): "INR", "PROTIME" in the last 168 hours. Cardiac Enzymes: No results for input(s): "CKTOTAL", "CKMB", "CKMBINDEX", "TROPONINI" in the last 168 hours. BNP (last 3 results) No results for input(s): "PROBNP" in the last 8760 hours. HbA1C: No results for input(s): "HGBA1C" in the last 72 hours. CBG: Recent Labs  Lab 07/09/22 0743 07/09/22 0816  GLUCAP 125* 110*   Lipid Profile: No results for input(s): "CHOL", "HDL", "LDLCALC", "TRIG", "CHOLHDL", "LDLDIRECT" in the last 72 hours. Thyroid Function Tests: No results for input(s): "TSH", "T4TOTAL", "FREET4", "T3FREE", "THYROIDAB" in the last 72 hours. Anemia Panel: No results for input(s): "VITAMINB12", "FOLATE", "FERRITIN", "TIBC", "IRON", "RETICCTPCT" in the last 72 hours. Urine analysis:    Component Value Date/Time   COLORURINE AMBER (A) 07/08/2022 2107   APPEARANCEUR CLOUDY (A) 07/08/2022 2107   APPEARANCEUR Clear 10/23/2013 2127   LABSPEC 1.017 07/08/2022 2107   LABSPEC 1.006 10/23/2013 2127   PHURINE 5.0 07/08/2022 2107   GLUCOSEU NEGATIVE 07/08/2022 2107  GLUCOSEU Negative 10/23/2013 2127   HGBUR SMALL (A) 07/08/2022 2107   BILIRUBINUR NEGATIVE 07/08/2022 2107   BILIRUBINUR negative 12/06/2021 0806   BILIRUBINUR Negative 10/23/2013 2127   [place] NEGATIVE 07/08/2022 2107   PROTEINUR 30 (A) 07/08/2022 2107   UROBILINOGEN 0.2 12/06/2021 0806   NITRITE NEGATIVE 07/08/2022 2107    LEUKOCYTESUR LARGE (A) 07/08/2022 2107   LEUKOCYTESUR 1+ 10/23/2013 2127   Sepsis Labs: _0 (procalcitonin:4,lacticidven:4) ) Recent Results (from the past 240 hour(s))  Blood Culture (routine x 2)     Status: None (Preliminary result)   Collection Time: 07/09/22  1:51 AM   Specimen: BLOOD  Result Value Ref Range Status   Specimen Description BLOOD BLOOD RIGHT ARM  Final   Special Requests   Final    BOTTLES DRAWN AEROBIC AND ANAEROBIC Blood Culture results may not be optimal due to an inadequate volume of blood received in culture bottles   Culture   Final    NO GROWTH < 12 HOURS Performed at [facility], [address] [place], [place] [address]    Report Status PENDING  Incomplete  Blood Culture (routine x 2)     Status: None (Preliminary result)   Collection Time: 07/09/22  1:52 AM   Specimen: BLOOD  Result Value Ref Range Status   Specimen Description BLOOD BLOOD RIGHT FOREARM  Final   Special Requests   Final    BOTTLES DRAWN AEROBIC AND ANAEROBIC Blood Culture results may not be optimal due to an inadequate volume of blood received in culture bottles   Culture   Final    NO GROWTH < 12 HOURS Performed at [facility], [address] [place], [place] [address]    Report Status PENDING  Incomplete     Radiological Exams on Admission: US Renal Transplant w/Doppler  Result Date: 07/09/2022 CLINICAL DATA:  Acute kidney injury. Urinary tract infection. Renal transplant approximately 5 years ago. EXAM: ULTRASOUND OF RENAL TRANSPLANT WITH RENAL DOPPLER ULTRASOUND TECHNIQUE: Ultrasound examination of the renal transplant was performed with gray-scale, color and duplex doppler evaluation. COMPARISON:  None Available. FINDINGS: Transplant kidney location: Right lower quadrant Transplant Kidney: Renal measurements: 11.2 x 5.8 x 5.6 cm = volume: 140m. Normal in size and parenchymal echogenicity. No evidence of mass or hydronephrosis. No peri-transplant  fluid collection seen. Color flow in the main renal artery:  Yes Color flow in the main renal vein:  Yes Duplex Doppler Evaluation: Intrarenal resistive index in upper pole:  0.72 (normal 0.6-0.8; equivocal 0.8-0.9; abnormal >= 0.9) Intrarenal resistive index in lower pole: 0.76 (normal 0.6-0.8; equivocal 0.8-0.9; abnormal >= 0.9) Bladder: Normal for degree of bladder distention. Other findings:  None. IMPRESSION: Normal appearance of renal transplant in right iliac fossa. No evidence of mass or hydronephrosis. Intrarenal resistive index values are within normal limits. Electronically Signed   By: JMarlaine HindM.D.   On: 07/09/2022 09:25   CT ABDOMEN PELVIS WO CONTRAST  Result Date: 07/09/2022 CLINICAL DATA:  Kidney failure with right-sided transplant and UTI. Recent bowel surgery. Now with UTI and AKI. EXAM: CT ABDOMEN AND PELVIS WITHOUT IV CONTRAST WITH ORAL CONTRAST ONLY TECHNIQUE: Multidetector CT imaging of the abdomen and pelvis was performed following the standard protocol without IV contrast. RADIATION DOSE REDUCTION: This exam was performed according to the departmental dose-optimization program which includes automated exposure control, adjustment of the mA and/or kV according to patient size and/or use of iterative reconstruction technique. COMPARISON:  Ultrasound complete abdomen 04/08/2021. No prior CT for comparison.  FINDINGS: Technical note: Respiratory motion limits evaluation of the lung bases and abdomen. Lower chest: No lung base infiltrate is seen. Small hiatal hernia. The cardiac size is normal. Trace calcification right coronary artery. Hepatobiliary: No focal liver abnormality is visible through the breathing motion. The gallbladder and bile ducts are unremarkable, as visualized. Pancreas: No focal abnormality is seen through the breathing motion. Spleen: No focal abnormality is seen through the breathing motion. No splenomegaly. Small splenule lateral to the spleen. Adrenals/Urinary  Tract: There is no adrenal mass. Severe chronic atrophy both kidneys is noted with no focal cortical abnormality seen through the breathing motion. No urinary stone or hydronephrosis is seen. There is a transplanted kidney in the right iliac fossa, multiple surgical clips clustered along the hilum of the transplant kidney and extending along the right external iliac vessels. The transplant ureteral insertion appears to be along the right anterolateral bladder wall and no urinary calculus or hydronephrosis is seen. The transplant renal cortex is unremarkable without contrast and there is no bladder thickening. Stomach/Bowel: Small hiatal hernia. No dilatation or wall thickening including the appendix. Advanced diverticular disease distal descending and sigmoid colon without acute inflammatory change. Vascular/Lymphatic: Aortic atherosclerosis. No enlarged abdominal or pelvic lymph nodes. Reproductive: The uterus and ovaries are not enlarged. Other: There are small umbilical and inguinal fat hernias. There is no free air, free fluid or free hemorrhage. There is no abscess or acute inflammatory change. Musculoskeletal: There are degenerative changes of the thoracic and lumbar spine with multilevel lumbar spinous process abutment and spurring and with advanced L4-5 facet hypertrophy and severe acquired spinal canal stenosis with grade 1 L4-5 spondylolisthesis. Metallic artifact is seen a lower right flank implanted power source and attached sacral stimulator wire extending through the left S3-S4 foramen into the posterior left pelvic wall. IMPRESSION: 1. No acute abnormality is seen with oral contrast only. 2. Transplanted kidney right iliac fossa without evidence of hydronephrosis or stone. 3. Sigmoid diverticulosis without inflammatory changes. 4. Small hiatal hernia with small umbilical and inguinal fat hernias. 5. Aortic and coronary artery atherosclerosis. 6. Severe acquired spinal stenosis at L4-5 due to advanced  facet hypertrophy, ligamentous thickening and degenerative spondylolisthesis. Electronically Signed   By: Telford Nab M.D.   On: 07/09/2022 04:03      Assessment/Plan Principal Problem:   Acute renal failure superimposed on stage 3a chronic kidney disease (HCC) Active Problems:   History of renal transplant   Anemia in chronic kidney disease   Essential hypertension   HLD (hyperlipidemia)   Hypokalemia   Incontinence of feces   Sepsis ([place])   UTI (urinary tract infection)   Type II diabetes mellitus with renal manifestations ([place])   Fall at home, initial encounter   Assessment and Plan: * Acute renal failure superimposed on stage 3a chronic kidney disease ([place]) Likely due to dehydration, patient reports poor appetite and decreased oral intake for several days.  Clinically patient is dry on examination.  CT scan of abdomen/pelvis negative for acute issues.  Urinalysis positive for UTI, but sample is contaminated with squamous cells.  Will follow-up urine culture. -Admitted to [facility] bed as inpatient -IV fluid: 1 L LR, followed by 100 cc/h of normal saline -Avoid using renal toxic medications. -Hold HCTZ -Follow-up by BMP   History of renal transplant Pt is following up with renal [facility] - continue home doe of Prograf and  Myfortic - check tacrolimus level - renal US is ordered by EDP - Dr. Candiss Norse of renal  is consulted  Anemia in chronic kidney disease Hemoglobin 12.9 on 06/23/2022 --> 11.8, slightly dropped, no active bleeding. -Follow-up with CBC  Essential hypertension - IV hydralazine as needed -Amlodipine, Coreg  HLD (hyperlipidemia) - Pravastatin  Hypokalemia Potassium 3.4 -Repleted potassium -Check magnesium level --> 1.9  Incontinence of feces S/p of sacral nerve stimulator recently.  Patient had a massive bowel movement after drinking oral contrast the ED. -C diff PCR is ordered by ED physician.  Sepsis ([place]) Sepsis due to possible UTI: Urinalysis is  positive for UTI, but with squamous cell contamination.  Patient meets criteria for sepsis with WBC 13.0, RR 29.  Since patient has worsening renal function and history of kidney transplant, IV Rocephin was started in ED, which is reasonable. -Continue IV Rocephin -Follow-up blood culture and urine culture -Check lactic acid level and procalcitonin level  UTI (urinary tract infection) -see above  Type II diabetes mellitus with renal manifestations ([place]) Diet-controlled diabetes: Blood sugar 239, recent A1c 7.7, poorly controlled.  Patient is not taking medications currently -Sliding scale insulin  Fall at home, initial encounter Patient states that she slightly rolled out of the bed, without any injury. -PT/OT -For precaution          DVT ppx:   SQ Lovenox  Code Status: Full code  Family Communication:  Yes, patient's husband by phone  Disposition Plan:  Anticipate discharge back to previous environment  Consults called:  Dr. Candiss Norse of renal  Admission status and Level of care: Med-Surg:     as inpt        Dispo: The patient is from: Home              Anticipated d/c is to: Home              Anticipated d/c date is: 2 days              Patient currently is not medically stable to d/c.    Severity of Illness:  The appropriate patient status for this patient is INPATIENT. Inpatient status is judged to be reasonable and necessary in order to provide the required intensity of service to ensure the patient's safety. The patient's presenting symptoms, physical exam findings, and initial radiographic and laboratory data in the context of their chronic comorbidities is felt to place them at high risk for further clinical deterioration. Furthermore, it is not anticipated that the patient will be medically stable for discharge from the hospital within 2 midnights of admission.   * I certify that at the point of admission it is my clinical judgment that the patient will require  inpatient hospital care spanning beyond 2 midnights from the point of admission due to high intensity of service, high risk for further deterioration and high frequency of surveillance required.*       Date of Service 07/09/2022    Ivor Costa [facility]   If 7PM-7AM, please contact night-coverage [email or web address] 07/09/2022, 10:22 AM

## 2022-07-09 NOTE — Assessment & Plan Note (Signed)
Likely due to dehydration, patient reports poor appetite and decreased oral intake for several days.  Clinically patient is dry on examination.  CT scan of abdomen/pelvis negative for acute issues.  Urinalysis positive for UTI, but sample is contaminated with squamous cells.  Will follow-up urine culture. -Admitted to [place] bed as inpatient -IV fluid: 1 L LR, followed by 100 cc/h of normal saline -Avoid using renal toxic medications. -Hold HCTZ -Follow-up by BMP

## 2022-07-09 NOTE — Assessment & Plan Note (Signed)
Potassium 3.4 -Repleted potassium -Check magnesium level --> 1.9

## 2022-07-09 NOTE — Assessment & Plan Note (Signed)
Diet-controlled diabetes: Blood sugar 239, recent A1c 7.7, poorly controlled.  Patient is not taking medications currently -Sliding scale insulin

## 2022-07-09 NOTE — Assessment & Plan Note (Signed)
-  see above 

## 2022-07-09 NOTE — Assessment & Plan Note (Signed)
S/p of sacral nerve stimulator recently.  Patient had a massive bowel movement after drinking oral contrast the ED. -C diff PCR is ordered by ED physician.

## 2022-07-09 NOTE — Assessment & Plan Note (Signed)
Patient states that she slightly rolled out of the bed, without any injury. -PT/OT -For precaution

## 2022-07-09 NOTE — Progress Notes (Signed)
PHARMACIST - PHYSICIAN COMMUNICATION  CONCERNING:  Enoxaparin (Lovenox) for DVT Prophylaxis    RECOMMENDATION: Patient was prescribed enoxaprin 40mg  q24 hours for VTE prophylaxis.   Filed Weights   07/08/22 2102  Weight: 59 kg (130 lb)    Body mass index is 23.03 kg/m.  Estimated Creatinine Clearance: 26.3 mL/min (A) (by C-G formula based on SCr of 1.67 mg/dL (H)).  Patient is candidate for enoxaparin 30mg  every 24 hours based on CrCl <19ml/min or Weight <45kg  DESCRIPTION: Pharmacy has adjusted enoxaparin dose per [facility] policy.  Patient is now receiving enoxaparin 30 mg every 24 hours   Renda Rolls, PharmD, [facility] 07/09/2022 5:52 AM

## 2022-07-09 NOTE — ED Notes (Signed)
Pt moved from stretcher to hospital bed. Voided and had a loose BM. Pt cleaned up. Linens changed, brief changed, gown changed. New purewick placed.

## 2022-07-09 NOTE — Consult Note (Signed)
[facility]  CONSULT NOTE    Date: 07/09/2022                  Patient Name:  Kristen Kirk  MRN: [id number]  DOB: 01-29-1951  Age / Sex: 71 y.o., female         PCP: Ria Bush, MD                 Service Requesting Consult: [facility]                 Reason for Consult: Acute kidney injury            History of Present Illness: [name] is a 71 y.o.  female with past medical concerns including diabetes, GERD, hypertension, hyperlipidemia, and chronic kidney disease s/p renal transplant in 04/2017, who was admitted to [facility] on 07/09/2022 for AKI (acute kidney injury) [facility]) [N17.9]  Patient reports to emergency department with husband for weakness and decreased appetite.  Patient states she has not had an appetite since nerve stimulator implanted on 06/29/2022.  Patient is a renal transplant recipient as of 04/2017, and is followed by [facility] physicians.  She states she has continued to take all prescribed medications however her oral and fluid intake have decreased greatly.  Denies nausea, vomiting, diarrhea.  She describes her symptoms as a loss of appetite.  Denies chest pain or discomfort.  Denies shortness of breath, remains on room air at this time.   Labs on ED arrival include sodium 132, potassium 3.4 serum bicarb 21, glucose 239, BUN 37, and creatinine 1.67 with GFR 33.  UA appears cloudy with leukocytes.  CT abdominal pelvis negative for obstruction.  Renal transplant ultrasound is normal.   Medications: Outpatient medications: (Not in a hospital admission)   Current medications: Current Facility-Administered Medications  Medication Dose Route Frequency Provider Last Rate Last Admin   0.9 %  sodium chloride infusion   Intravenous Continuous Ivor Costa, MD 100 mL/hr at 07/09/22 0824 New Bag at 07/09/22 0824   acetaminophen (TYLENOL) tablet 650 mg  650 mg Oral Q6H PRN Mansy, Jan A, MD       Or   acetaminophen (TYLENOL) suppository 650 mg  650  mg Rectal Q6H PRN Mansy, Jan A, MD       amLODipine (NORVASC) tablet 2.5 mg  2.5 mg Oral Daily Ivor Costa, MD   2.5 mg at 07/09/22 1056   aspirin EC tablet 81 mg  81 mg Oral Daily Ivor Costa, MD   81 mg at 07/09/22 1055   carvedilol (COREG) tablet 12.5 mg  12.5 mg Oral BID WC Ivor Costa, MD   12.5 mg at 07/09/22 0819   cefTRIAXone (ROCEPHIN) 2 g in sodium chloride 0.9 % 100 mL IVPB  2 g Intravenous Q24H Mansy, Jan A, MD       cholecalciferol (VITAMIN D3) 25 MCG (1000 UNIT) tablet 2,000 Units  2,000 Units Oral Daily Ivor Costa, MD   2,000 Units at 07/09/22 1055   [START ON 07/10/2022] dorzolamide-timolol (COSOPT) 22.3-6.8 MG/ML ophthalmic solution 1 drop  1 drop Both Eyes TID Ivor Costa, MD       enoxaparin (LOVENOX) injection 30 mg  30 mg Subcutaneous Q24H Mansy, Jan A, MD   30 mg at 07/09/22 1059   hydrALAZINE (APRESOLINE) injection 5 mg  5 mg Intravenous Q2H PRN Ivor Costa, MD       HYDROcodone-acetaminophen (NORCO/VICODIN) 5-325 MG per tablet 1 tablet  1 tablet  Oral Q6H PRN Ivor Costa, MD       insulin aspart (novoLOG) injection 0-5 Units  0-5 Units Subcutaneous QHS Ivor Costa, MD       insulin aspart (novoLOG) injection 0-9 Units  0-9 Units Subcutaneous TID WC Ivor Costa, MD       Derrill Memo ON 07/10/2022] ketorolac (ACULAR) 0.5 % ophthalmic solution 1 drop  1 drop Left Eye QID Ivor Costa, MD       [START ON 07/10/2022] latanoprost (XALATAN) 0.005 % ophthalmic solution 1 drop  1 drop Right Eye QHS Ivor Costa, MD       magnesium hydroxide (MILK OF MAGNESIA) suspension 30 mL  30 mL Oral Daily PRN Mansy, Jan A, MD       melatonin tablet 5 mg  5 mg Oral QHS PRN Ivor Costa, MD       multivitamin with minerals tablet 1 tablet  1 tablet Oral Daily Ivor Costa, MD   1 tablet at 07/09/22 1055   mycophenolate (CELLCEPT) capsule 500 mg  500 mg Oral BID Ivor Costa, MD   500 mg at 07/09/22 1054   ondansetron (ZOFRAN) tablet 4 mg  4 mg Oral Q6H PRN Mansy, Jan A, MD       Or   ondansetron [facility]) injection 4 mg  4  mg Intravenous Q6H PRN Mansy, Jan A, MD       pantoprazole (PROTONIX) EC tablet 20 mg  20 mg Oral Daily Ivor Costa, MD   20 mg at 07/09/22 1054   pravastatin (PRAVACHOL) tablet 80 mg  80 mg Oral QHS Ivor Costa, MD       tacrolimus (PROGRAF) capsule 1 mg  1 mg Oral Daily Ivor Costa, MD   1 mg at 07/09/22 1055   And   tacrolimus (PROGRAF) capsule 0.5 mg  0.5 mg Oral QHS Ivor Costa, MD       traZODone (DESYREL) tablet 25 mg  25 mg Oral QHS PRN Mansy, Arvella Merles, MD       Current Outpatient Medications  Medication Sig Dispense Refill   aspirin 81 MG tablet Take 81 mg by mouth daily.     carvedilol (COREG) 25 MG tablet Take 12.5 mg by mouth 2 (two) times daily with a meal.     Cholecalciferol (VITAMIN D3) 1000 units CAPS Take 2 capsules (2,000 Units total) by mouth daily. 60 capsule    dorzolamide-timolol (COSOPT) 22.3-6.8 MG/ML ophthalmic solution Place 1 drop into both eyes 3 (three) times daily.     famotidine (PEPCID) 20 MG tablet Take 1 tablet (20 mg total) by mouth 2 (two) times daily.     hydrochlorothiazide (HYDRODIURIL) 12.5 MG tablet Take 1 tablet (12.5 mg total) by mouth daily. 90 tablet 3   ketorolac (ACULAR) 0.5 % ophthalmic solution Place 1 drop into the left eye 4 (four) times daily.     latanoprost (XALATAN) 0.005 % ophthalmic solution Place 1 drop into the right eye at bedtime.     Multiple Vitamin (MULTIVITAMIN) tablet Take 1 tablet by mouth daily.     pantoprazole (PROTONIX) 20 MG tablet TAKE 1 TABLET BY MOUTH EVERY DAY 90 tablet 3   pravastatin (PRAVACHOL) 80 MG tablet TAKE 1 TABLET BY MOUTH  EVERY EVENING (Patient taking differently: Take 80 mg by mouth daily.) 90 tablet 0   prednisoLONE acetate (PRED FORTE) 1 % ophthalmic suspension Place 1 drop into the left eye 4 (four) times daily.     Wheat Dextrin (BENEFIBER DRINK MIX PO) Take by  mouth 2 (two) times daily.     alendronate (FOSAMAX) 70 MG tablet Take 1 tablet (70 mg total) by mouth once a week. Take with a full glass of water  on an empty stomach. (Patient not taking: Reported on 07/09/2022) 12 tablet 3   alendronate-cholecalciferol (FOSAMAX PLUS D) 70-5600 MG-UNIT tablet Take 1 tablet by mouth every 7 (seven) days. Take with a full glass of water on an empty stomach.     amLODipine (NORVASC) 2.5 MG tablet Take 2.5 mg by mouth daily.     apraclonidine (IOPIDINE) 0.5 % ophthalmic solution Place 1 drop into the left eye 2 (two) times daily. (Patient not taking: Reported on 07/09/2022)     Blood Glucose Monitoring Suppl (ONE TOUCH ULTRA SYSTEM KIT) W/DEVICE KIT 1 kit by Does not apply route once. 1 each 0   docusate sodium (COLACE) 100 MG capsule Take 100 mg by mouth 2 (two) times daily as needed.  0   glucose blood test strip Check sugars once daily 100 each 11   HYDROcodone-acetaminophen (NORCO/VICODIN) 5-325 MG tablet Take 1 tablet by mouth every 6 (six) hours as needed. 20 tablet 0   Melatonin 3 MG CAPS Take 1-2 capsules (3-6 mg total) by mouth at bedtime as needed (sleep).  0   mirabegron ER (MYRBETRIQ) 25 MG TB24 tablet Take 25 mg by mouth daily. (Patient not taking: Reported on 07/09/2022)     mycophenolate (MYFORTIC) 180 MG EC tablet Take 500 mg by mouth 2 (two) times daily. (Patient not taking: Reported on 07/09/2022)     tacrolimus (PROGRAF) 0.5 MG capsule Take 0.5 mg by mouth 2 (two) times daily. 2 tabs in am and 1 at night     tiZANidine (ZANAFLEX) 2 MG tablet Take 1 tablet (2 mg total) by mouth 2 (two) times daily as needed for muscle spasms (sedation precautions). (Patient not taking: Reported on 07/09/2022) 30 tablet 0   travoprost, benzalkonium, (TRAVATAN) 0.004 % ophthalmic solution Place 1 drop into both eyes at bedtime. (Patient not taking: Reported on 07/09/2022)        Allergies: Allergies  Allergen Reactions   Brimonidine     Other reaction(s): Other (See Comments) eye redness   Shellfish-Derived Products Swelling   Tramadol Other (See Comments)    Dizziness even at 1/2 tab      Past Medical  History: Past Medical History:  Diagnosis Date   Anemia in chronic kidney disease    a. s/p aranesp 11/2013 now starting epo with HD 06/2014   Diabetes mellitus type II    a. Currently diet controlled since losing weight.   DJD (degenerative joint disease)    Essential hypertension    GERD (gastroesophageal reflux disease)    Glaucoma, both eyes    a. L>R   History of echocardiogram    a. 01/2015 Echo [facility]): EF ?55%, triv MR, mildly dil LA, nl PV.   History of end stage renal disease    previous on HD;   received kidney transplant 07/ 2018 (on dialysis since 2015 MWF)  followed by [facility] renal (Dr. Daphane Shepherd) considering transplant Access: LUE fistula Establishing with [place] [facility] center   History of stress test    a. 03/2013 Myoview [facility]): EF 63%, no ischemia.   History of syncope    a. 12/2015.   History of Urge Incontinence    HLD (hyperlipidemia)    Osteoarthritis    Osteoporosis 05/03/2019   DEXA 04/2019 - 1.5 spine, -2.1 hip, -2.5 forearm Check phosphate  and PTH to guide further management in h/o kidney transplant   S/P kidney transplant 04/2017   '@[facility]' ---   per pt one kidney from living-donor   Vaginal atrophy    a. vagifem caused spotting, normal TVS   Vitamin D deficiency    Wears glasses    stated on 05/31/2022   Wears partial dentures    upper 2 teeth. 05/31/2022     Past Surgical History: Past Surgical History:  Procedure Laterality Date   ANAL RECTAL MANOMETRY N/A 03/16/2021   Procedure: ANO RECTAL MANOMETRY;  Surgeon: Mauri Pole, MD;  Location: [facility];  Service: Endoscopy;  Laterality: N/A;   BLADDER SUSPENSION  2011   for stress incontinence-Dr. MacDiarmid   BREAST BIOPSY Right 12/15/2020   fat necrosis   CATARACT EXTRACTION W/ INTRAOCULAR LENS IMPLANT Bilateral    left 02-25-2021;  right ?   COLONOSCOPY  03/2019   diverticulosis, rpt 10 yrs (Nandigam)   KIDNEY TRANSPLANT Right 04/03/2017   [facility])   left eye laser surgery  06/2011    LIGATION OF ARTERIOVENOUS  FISTULA  10/2017   thrombosed L arm AFV   NEPHRECTOMY TRANSPLANTED ORGAN     REFRACTIVE SURGERY     TUBAL LIGATION Bilateral    1990s     Family History: Family History  Problem Relation Age of Onset   Other Father        she did not know her father.   Diabetes Mother    Heart failure Mother        died @ 50   Stroke Brother    Hyperlipidemia Brother    Hypertension Brother    Hyperlipidemia Brother    Hypertension Brother    Hyperlipidemia Sister    Hypertension Sister    Hyperlipidemia Sister    Hypertension Sister    Heart failure Maternal Grandfather    Breast cancer Neg Hx    Colon cancer Neg Hx    Esophageal cancer Neg Hx    Stomach cancer Neg Hx    Rectal cancer Neg Hx      Social History: Social History   Socioeconomic History   Marital status: Married    Spouse name: Not on file   Number of children: 1   Years of education: Not on file   Highest education level: Not on file  Occupational History   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  Vaping Use   Vaping Use: Never used  Substance and Sexual Activity   Alcohol use: No    Alcohol/week: 0.0 standard drinks of alcohol   Drug use: No   Sexual activity: Yes    Partners: Male  Other Topics Concern   Not on file  Social History Narrative   Lives with husband in [place].   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[employer], 3rd shift   Activity: no regular exercise - goes to gym   Diet: good water, fruits/vegetables daily   Social Determinants of Health   Financial Resource Strain: Low Risk  (04/12/2022)   Overall Financial Resource Strain (CARDIA)    Difficulty of Paying Living Expenses: Not hard at all  Food Insecurity: No  Food Insecurity (04/12/2022)  Hunger Vital Sign    Worried About Running Out of Food in the Last Year: Never true    Ran Out of Food in the Last Year: Never true  Transportation Needs: No Transportation Needs (04/12/2022)   PRAPARE - Hydrologist (Medical): No    Lack of Transportation (Non-Medical): No  Physical Activity: Insufficiently Active (04/12/2022)   Exercise Vital Sign    Days of Exercise per Week: 3 days    Minutes of Exercise per Session: 30 min  Stress: No Stress Concern Present (04/12/2022)   [place]    Feeling of Stress : Only a little  Social Connections: Moderately Integrated (04/12/2022)   Social Connection and Isolation Panel [NHANES]    Frequency of Communication with Friends and Family: More than three times a week    Frequency of Social Gatherings with Friends and Family: More than three times a week    Attends Religious Services: More than 4 times per year    Active Member of [employer] or Organizations: No    Attends Archivist Meetings: Never    Marital Status: Married  Human resources officer Violence: Not At Risk (04/12/2022)   Humiliation, Afraid, Rape, and Kick questionnaire    Fear of Current or Ex-Partner: No    Emotionally Abused: No    Physically Abused: No    Sexually Abused: No     Review of Systems: Review of Systems  Constitutional:  Negative for chills, fever and malaise/fatigue.  HENT:  Negative for congestion, sore throat and tinnitus.   Eyes:  Negative for blurred vision and redness.  Respiratory:  Negative for cough, shortness of breath and wheezing.   Cardiovascular:  Negative for chest pain, palpitations, claudication and leg swelling.  Gastrointestinal:  Negative for abdominal pain, blood in stool, diarrhea, nausea and vomiting.  Genitourinary:  Negative for flank pain, frequency and hematuria.  Musculoskeletal:  Negative for back pain, falls  and myalgias.  Skin:  Negative for rash.  Neurological:  Positive for weakness. Negative for dizziness and headaches.  Endo/Heme/Allergies:  Does not bruise/bleed easily.  Psychiatric/Behavioral:  Negative for depression. The patient is not nervous/anxious and does not have insomnia.     Vital Signs: Blood pressure 99/62, pulse 74, temperature 98.6 F (37 C), temperature source Oral, resp. rate 16, height '5\' 3"'  (1.6 m), weight 59 kg, SpO2 100 %.  Weight trends: Filed Weights   07/08/22 2102  Weight: 59 kg    Physical Exam: General: NAD  Head: Normocephalic, atraumatic. Moist oral mucosal membranes  Eyes: Anicteric  Lungs:  Clear to auscultation, normal effort, room air  Heart: Regular rate and rhythm  Abdomen:  Soft, nontender  Extremities: No peripheral edema.  Neurologic: Nonfocal, moving all four extremities  Skin: No lesions  Access: Right AVF     Lab results: Basic Metabolic Panel: Recent Labs  Lab 07/08/22 2107 07/08/22 2109  NA 132*  --   K 3.4*  --   CL 100  --   CO2 21*  --   GLUCOSE 239*  --   BUN 37*  --   CREATININE 1.67*  --   CALCIUM 9.2  --   MG  --  1.9    Liver Function Tests: No results for input(s): "AST", "ALT", "ALKPHOS", "BILITOT", "PROT", "ALBUMIN" in the last 168 hours. No results for input(s): "LIPASE", "AMYLASE" in the last 168 hours. No results for input(s): "AMMONIA" in the last 168  hours.  CBC: Recent Labs  Lab 07/08/22 2107  WBC 13.0*  HGB 11.8*  HCT 38.1  MCV 83.9  PLT 221    Cardiac Enzymes: No results for input(s): "CKTOTAL", "CKMB", "CKMBINDEX", "TROPONINI" in the last 168 hours.  BNP: Invalid input(s): "POCBNP"  CBG: Recent Labs  Lab 07/09/22 0743 07/09/22 0816  GLUCAP 125* 110*    Microbiology: Results for orders placed or performed during the hospital encounter of 07/09/22  Blood Culture (routine x 2)     Status: None (Preliminary result)   Collection Time: 07/09/22  1:51 AM   Specimen: BLOOD   Result Value Ref Range Status   Specimen Description BLOOD BLOOD RIGHT ARM  Final   Special Requests   Final    BOTTLES DRAWN AEROBIC AND ANAEROBIC Blood Culture results may not be optimal due to an inadequate volume of blood received in culture bottles   Culture   Final    NO GROWTH < 12 HOURS Performed at [facility], [address], [place], [place] [address]    Report Status PENDING  Incomplete  Blood Culture (routine x 2)     Status: None (Preliminary result)   Collection Time: 07/09/22  1:52 AM   Specimen: BLOOD  Result Value Ref Range Status   Specimen Description BLOOD BLOOD RIGHT FOREARM  Final   Special Requests   Final    BOTTLES DRAWN AEROBIC AND ANAEROBIC Blood Culture results may not be optimal due to an inadequate volume of blood received in culture bottles   Culture   Final    NO GROWTH < 12 HOURS Performed at [facility], [address], [place], [place] [address]    Report Status PENDING  Incomplete    Coagulation Studies: No results for input(s): "LABPROT", "INR" in the last 72 hours.  Urinalysis: Recent Labs    07/08/22 2107  COLORURINE AMBER*  LABSPEC 1.017  PHURINE 5.0  GLUCOSEU NEGATIVE  HGBUR SMALL*  BILIRUBINUR NEGATIVE  KETONESUR NEGATIVE  PROTEINUR 30*  NITRITE NEGATIVE  LEUKOCYTESUR LARGE*      Imaging: US Renal Transplant w/Doppler  Result Date: 07/09/2022 CLINICAL DATA:  Acute kidney injury. Urinary tract infection. Renal transplant approximately 5 years ago. EXAM: ULTRASOUND OF RENAL TRANSPLANT WITH RENAL DOPPLER ULTRASOUND TECHNIQUE: Ultrasound examination of the renal transplant was performed with gray-scale, color and duplex doppler evaluation. COMPARISON:  None Available. FINDINGS: Transplant kidney location: Right lower quadrant Transplant Kidney: Renal measurements: 11.2 x 5.8 x 5.6 cm = volume: 122m. Normal in size and parenchymal echogenicity. No evidence of mass or hydronephrosis. No peri-transplant fluid  collection seen. Color flow in the main renal artery:  Yes Color flow in the main renal vein:  Yes Duplex Doppler Evaluation: Intrarenal resistive index in upper pole:  0.72 (normal 0.6-0.8; equivocal 0.8-0.9; abnormal >= 0.9) Intrarenal resistive index in lower pole: 0.76 (normal 0.6-0.8; equivocal 0.8-0.9; abnormal >= 0.9) Bladder: Normal for degree of bladder distention. Other findings:  None. IMPRESSION: Normal appearance of renal transplant in right iliac fossa. No evidence of mass or hydronephrosis. Intrarenal resistive index values are within normal limits. Electronically Signed   By: JMarlaine HindM.D.   On: 07/09/2022 09:25   CT ABDOMEN PELVIS WO CONTRAST  Result Date: 07/09/2022 CLINICAL DATA:  Kidney failure with right-sided transplant and UTI. Recent bowel surgery. Now with UTI and AKI. EXAM: CT ABDOMEN AND PELVIS WITHOUT IV CONTRAST WITH ORAL CONTRAST ONLY TECHNIQUE: Multidetector CT imaging of the abdomen and pelvis was performed following the  standard protocol without IV contrast. RADIATION DOSE REDUCTION: This exam was performed according to the departmental dose-optimization program which includes automated exposure control, adjustment of the mA and/or kV according to patient size and/or use of iterative reconstruction technique. COMPARISON:  Ultrasound complete abdomen 04/08/2021. No prior CT for comparison. FINDINGS: Technical note: Respiratory motion limits evaluation of the lung bases and abdomen. Lower chest: No lung base infiltrate is seen. Small hiatal hernia. The cardiac size is normal. Trace calcification right coronary artery. Hepatobiliary: No focal liver abnormality is visible through the breathing motion. The gallbladder and bile ducts are unremarkable, as visualized. Pancreas: No focal abnormality is seen through the breathing motion. Spleen: No focal abnormality is seen through the breathing motion. No splenomegaly. Small splenule lateral to the spleen. Adrenals/Urinary Tract:  There is no adrenal mass. Severe chronic atrophy both kidneys is noted with no focal cortical abnormality seen through the breathing motion. No urinary stone or hydronephrosis is seen. There is a transplanted kidney in the right iliac fossa, multiple surgical clips clustered along the hilum of the transplant kidney and extending along the right external iliac vessels. The transplant ureteral insertion appears to be along the right anterolateral bladder wall and no urinary calculus or hydronephrosis is seen. The transplant renal cortex is unremarkable without contrast and there is no bladder thickening. Stomach/Bowel: Small hiatal hernia. No dilatation or wall thickening including the appendix. Advanced diverticular disease distal descending and sigmoid colon without acute inflammatory change. Vascular/Lymphatic: Aortic atherosclerosis. No enlarged abdominal or pelvic lymph nodes. Reproductive: The uterus and ovaries are not enlarged. Other: There are small umbilical and inguinal fat hernias. There is no free air, free fluid or free hemorrhage. There is no abscess or acute inflammatory change. Musculoskeletal: There are degenerative changes of the thoracic and lumbar spine with multilevel lumbar spinous process abutment and spurring and with advanced L4-5 facet hypertrophy and severe acquired spinal canal stenosis with grade 1 L4-5 spondylolisthesis. Metallic artifact is seen a lower right flank implanted power source and attached sacral stimulator wire extending through the left S3-S4 foramen into the posterior left pelvic wall. IMPRESSION: 1. No acute abnormality is seen with oral contrast only. 2. Transplanted kidney right iliac fossa without evidence of hydronephrosis or stone. 3. Sigmoid diverticulosis without inflammatory changes. 4. Small hiatal hernia with small umbilical and inguinal fat hernias. 5. Aortic and coronary artery atherosclerosis. 6. Severe acquired spinal stenosis at L4-5 due to advanced facet  hypertrophy, ligamentous thickening and degenerative spondylolisthesis. Electronically Signed   By: Telford Nab M.D.   On: 07/09/2022 04:03     Assessment & Plan: Ms. SYBELLA HARNISH is a 71 y.o.  female with past medical concerns including diabetes, GERD, hypertension, hyperlipidemia, and chronic kidney disease s/p renal transplant in 04/2017, who was admitted to [facility] on 07/09/2022 for AKI (acute kidney injury) [facility]) [N17.9]  Acute kidney injury on chronic kidney disease stage IIIa status post cadaver renal transplant on April 02, 2017 at [facility].  Patient continues to follow with [facility].  Acute kidney injury appears multifactorial from dehydration and suspected urinary tract infection.  Baseline appears to be creatinine 1.03.  CT abdomen pelvis and renal transplant ultrasound negative for obstruction.  No IV contrast exposure recently.  Agree with IV fluids for hydration.  Awaiting pharmacy to verify immunosuppressive medications.  Appears patient prescribed tacrolimus 1 mg in the morning and 0.5 mg at night and Myfortic 360 mg twice daily.  2.  Hypertension with chronic kidney disease.  Home regimen includes  carvedilol, hydrochlorothiazide, and amlodipine.  Hydrochlorothiazide currently held.  Blood pressure 99/62.  3. Anemia of chronic kidney disease Lab Results  Component Value Date   HGB 11.8 (L) 07/08/2022  Hemoglobin remains within desired range.  4.  Urinary tract infection confirmed with urinalysis and pending urine culture.  Antibiotics prescribed and managed by primary team.    LOS: 0   10/8/202312:11 PM

## 2022-07-09 NOTE — ED Notes (Signed)
Pt's husband states that pt has been weak and without an appetite for 2 days. Pt has fallen twice and been incontinent. Pt has been weak. Pt had a procedure done a week ago due to inability to control bowels. Pt's husband indicates that it has not helped and pt has been incontinent of bowels at least twice during the falls.

## 2022-07-09 NOTE — ED Notes (Signed)
Pt had BM (diarrhea) on self and had to be cleaned. Entire set of linens changed, purewick changed, and pt cleaned up. Pt placed in brief.

## 2022-07-09 NOTE — Assessment & Plan Note (Signed)
-   IV hydralazine as needed -Amlodipine, Coreg 

## 2022-07-10 DIAGNOSIS — N1831 Chronic kidney disease, stage 3a: Secondary | ICD-10-CM | POA: Diagnosis not present

## 2022-07-10 DIAGNOSIS — N179 Acute kidney failure, unspecified: Secondary | ICD-10-CM | POA: Diagnosis not present

## 2022-07-10 DIAGNOSIS — A419 Sepsis, unspecified organism: Secondary | ICD-10-CM | POA: Diagnosis not present

## 2022-07-10 DIAGNOSIS — N3000 Acute cystitis without hematuria: Secondary | ICD-10-CM | POA: Diagnosis not present

## 2022-07-10 LAB — CBC
HCT: 36.6 % (ref 36.0–46.0)
Hemoglobin: 11.3 g/dL — ABNORMAL LOW (ref 12.0–15.0)
MCH: 25.9 pg — ABNORMAL LOW (ref 26.0–34.0)
MCHC: 30.9 g/dL (ref 30.0–36.0)
MCV: 83.8 fL (ref 80.0–100.0)
Platelets: 230 10*3/uL (ref 150–400)
RBC: 4.37 MIL/uL (ref 3.87–5.11)
RDW: 13.3 % (ref 11.5–15.5)
WBC: 7.7 10*3/uL (ref 4.0–10.5)
nRBC: 0 % (ref 0.0–0.2)

## 2022-07-10 LAB — GLUCOSE, CAPILLARY
Glucose-Capillary: 112 mg/dL — ABNORMAL HIGH (ref 70–99)
Glucose-Capillary: 231 mg/dL — ABNORMAL HIGH (ref 70–99)
Glucose-Capillary: 118 mg/dL — ABNORMAL HIGH (ref 70–99)
Glucose-Capillary: 151 mg/dL — ABNORMAL HIGH (ref 70–99)

## 2022-07-10 LAB — BASIC METABOLIC PANEL
Anion gap: 7 (ref 5–15)
BUN: 23 mg/dL (ref 8–23)
CO2: 24 mmol/L (ref 22–32)
Calcium: 9.2 mg/dL (ref 8.9–10.3)
Chloride: 109 mmol/L (ref 98–111)
Creatinine, Ser: 0.99 mg/dL (ref 0.44–1.00)
GFR, Estimated: >60 mL/min (ref >60)
Glucose, Bld: 116 mg/dL — ABNORMAL HIGH (ref 70–99)
Potassium: 3.7 mmol/L (ref 3.5–5.1)
Sodium: 140 mmol/L (ref 135–145)

## 2022-07-10 LAB — PROTIME-INR
INR: 1.2 (ref 0.8–1.2)
Prothrombin Time: 15.1 seconds (ref 11.4–15.2)

## 2022-07-10 LAB — PROCALCITONIN: Procalcitonin: 0.3 ng/mL

## 2022-07-10 LAB — CORTISOL-AM, BLOOD: Cortisol - AM: 17.2 ug/dL (ref 6.7–22.6)

## 2022-07-10 LAB — HIV ANTIBODY (ROUTINE TESTING W REFLEX): HIV Screen 4th Generation wRfx: NONREACTIVE

## 2022-07-10 NOTE — Progress Notes (Signed)
[facility]  ROUNDING NOTE   Subjective:   Patient seen sitting in chair Alert and oriented Tolerating meals without nausea and vomiting Appetite improving  Objective:  Vital signs in last 24 hours:  Temp:  [97.8 F (36.6 C)-98.6 F (37 C)] 98.6 F (37 C) (10/09 0849) Pulse Rate:  [63-73] 69 (10/09 0849) Resp:  [16-19] 17 (10/09 0849) BP: (101-167)/(55-97) 167/72 (10/09 0849) SpO2:  [100 %] 100 % (10/09 0849)  Weight change:  Filed Weights   07/08/22 2102  Weight: 59 kg    Intake/Output: I/O last 3 completed shifts: In: 1637.1 [I.V.:1537.1; IV Piggyback:100] Out: 1000 [Urine:1000]   Intake/Output this shift:  No intake/output data recorded.  Physical Exam: General: NAD  Head: Normocephalic, atraumatic. Moist oral mucosal membranes  Eyes: Anicteric  Lungs:  Clear to auscultation, normal effort  Heart: Regular rate and rhythm  Abdomen:  Soft, nontender  Extremities:  No peripheral edema.  Neurologic: Nonfocal, moving all four extremities  Skin: No lesions  Access: Rt AVF    Basic Metabolic Panel: Recent Labs  Lab 07/08/22 2107 07/08/22 2109 07/10/22 0403  NA 132*  --  140  K 3.4*  --  3.7  CL 100  --  109  CO2 21*  --  24  GLUCOSE 239*  --  116*  BUN 37*  --  23  CREATININE 1.67*  --  0.99  CALCIUM 9.2  --  9.2  MG  --  1.9  --     Liver Function Tests: No results for input(s): "AST", "ALT", "ALKPHOS", "BILITOT", "PROT", "ALBUMIN" in the last 168 hours. No results for input(s): "LIPASE", "AMYLASE" in the last 168 hours. No results for input(s): "AMMONIA" in the last 168 hours.  CBC: Recent Labs  Lab 07/08/22 2107 07/10/22 0403  WBC 13.0* 7.7  HGB 11.8* 11.3*  HCT 38.1 36.6  MCV 83.9 83.8  PLT 221 230    Cardiac Enzymes: No results for input(s): "CKTOTAL", "CKMB", "CKMBINDEX", "TROPONINI" in the last 168 hours.  BNP: Invalid input(s): "POCBNP"  CBG: Recent Labs  Lab 07/09/22 1204 07/09/22 1742 07/09/22 2253  07/10/22 0850 07/10/22 1141  GLUCAP 154* 152* 129* 112* 231*    Microbiology: Results for orders placed or performed during the hospital encounter of 07/09/22  Urine Culture     Status: Abnormal (Preliminary result)   Collection Time: 07/08/22  9:06 PM   Specimen: Urine, Clean Catch  Result Value Ref Range Status   Specimen Description   Final    URINE, CLEAN CATCH Performed at [facility], [address], [place], [place] [address]    Special Requests   Final    NONE Performed at [facility], [address] [place], [place] [address]    Culture (A)  Final    >=100,000 COLONIES/mL KLEBSIELLA PNEUMONIAE SUSCEPTIBILITIES TO FOLLOW Performed at [facility], [place] [address] [place], [place] [address]    Report Status PENDING  Incomplete  Blood Culture (routine x 2)     Status: None (Preliminary result)   Collection Time: 07/09/22  1:51 AM   Specimen: BLOOD  Result Value Ref Range Status   Specimen Description BLOOD BLOOD RIGHT ARM  Final   Special Requests   Final    BOTTLES DRAWN AEROBIC AND ANAEROBIC Blood Culture results may not be optimal due to an inadequate volume of blood received in culture bottles   Culture   Final    NO GROWTH 1 DAY Performed at [facility], [address] [place], [place] [address]    Report Status PENDING  Incomplete  Blood Culture (routine x 2)     Status: None (Preliminary result)   Collection Time: 07/09/22  1:52 AM   Specimen: BLOOD  Result Value Ref Range Status   Specimen Description BLOOD BLOOD RIGHT FOREARM  Final   Special Requests   Final    BOTTLES DRAWN AEROBIC AND ANAEROBIC Blood Culture results may not be optimal due to an inadequate volume of blood received in culture bottles   Culture   Final    NO GROWTH 1 DAY Performed at [facility], [address] [place], [place] [address]    Report Status PENDING  Incomplete    Coagulation Studies: Recent Labs    07/10/22 0403   LABPROT 15.1  INR 1.2    Urinalysis: Recent Labs    07/08/22 2107  COLORURINE AMBER*  LABSPEC 1.017  PHURINE 5.0  GLUCOSEU NEGATIVE  HGBUR SMALL*  BILIRUBINUR NEGATIVE  KETONESUR NEGATIVE  PROTEINUR 30*  NITRITE NEGATIVE  LEUKOCYTESUR LARGE*      Imaging: US Renal Transplant w/Doppler  Result Date: 07/09/2022 CLINICAL DATA:  Acute kidney injury. Urinary tract infection. Renal transplant approximately 5 years ago. EXAM: ULTRASOUND OF RENAL TRANSPLANT WITH RENAL DOPPLER ULTRASOUND TECHNIQUE: Ultrasound examination of the renal transplant was performed with gray-scale, color and duplex doppler evaluation. COMPARISON:  None Available. FINDINGS: Transplant kidney location: Right lower quadrant Transplant Kidney: Renal measurements: 11.2 x 5.8 x 5.6 cm = volume: 1100mL. Normal in size and parenchymal echogenicity. No evidence of mass or hydronephrosis. No peri-transplant fluid collection seen. Color flow in the main renal artery:  Yes Color flow in the main renal vein:  Yes Duplex Doppler Evaluation: Intrarenal resistive index in upper pole:  0.72 (normal 0.6-0.8; equivocal 0.8-0.9; abnormal >= 0.9) Intrarenal resistive index in lower pole: 0.76 (normal 0.6-0.8; equivocal 0.8-0.9; abnormal >= 0.9) Bladder: Normal for degree of bladder distention. Other findings:  None. IMPRESSION: Normal appearance of renal transplant in right iliac fossa. No evidence of mass or hydronephrosis. Intrarenal resistive index values are within normal limits. Electronically Signed   By: Marlaine Hind M.D.   On: 07/09/2022 09:25   CT ABDOMEN PELVIS WO CONTRAST  Result Date: 07/09/2022 CLINICAL DATA:  Kidney failure with right-sided transplant and UTI. Recent bowel surgery. Now with UTI and AKI. EXAM: CT ABDOMEN AND PELVIS WITHOUT IV CONTRAST WITH ORAL CONTRAST ONLY TECHNIQUE: Multidetector CT imaging of the abdomen and pelvis was performed following the standard protocol without IV contrast. RADIATION DOSE REDUCTION:  This exam was performed according to the departmental dose-optimization program which includes automated exposure control, adjustment of the mA and/or kV according to patient size and/or use of iterative reconstruction technique. COMPARISON:  Ultrasound complete abdomen 04/08/2021. No prior CT for comparison. FINDINGS: Technical note: Respiratory motion limits evaluation of the lung bases and abdomen. Lower chest: No lung base infiltrate is seen. Small hiatal hernia. The cardiac size is normal. Trace calcification right coronary artery. Hepatobiliary: No focal liver abnormality is visible through the breathing motion. The gallbladder and bile ducts are unremarkable, as visualized. Pancreas: No focal abnormality is seen through the breathing motion. Spleen: No focal abnormality is seen through the breathing motion. No splenomegaly. Small splenule lateral to the spleen. Adrenals/Urinary Tract: There is no adrenal mass. Severe chronic atrophy both kidneys is noted with no focal cortical abnormality seen through the breathing motion. No urinary stone or hydronephrosis is seen. There is a  transplanted kidney in the right iliac fossa, multiple surgical clips clustered along the hilum of the transplant kidney and extending along the right external iliac vessels. The transplant ureteral insertion appears to be along the right anterolateral bladder wall and no urinary calculus or hydronephrosis is seen. The transplant renal cortex is unremarkable without contrast and there is no bladder thickening. Stomach/Bowel: Small hiatal hernia. No dilatation or wall thickening including the appendix. Advanced diverticular disease distal descending and sigmoid colon without acute inflammatory change. Vascular/Lymphatic: Aortic atherosclerosis. No enlarged abdominal or pelvic lymph nodes. Reproductive: The uterus and ovaries are not enlarged. Other: There are small umbilical and inguinal fat hernias. There is no free air, free fluid or  free hemorrhage. There is no abscess or acute inflammatory change. Musculoskeletal: There are degenerative changes of the thoracic and lumbar spine with multilevel lumbar spinous process abutment and spurring and with advanced L4-5 facet hypertrophy and severe acquired spinal canal stenosis with grade 1 L4-5 spondylolisthesis. Metallic artifact is seen a lower right flank implanted power source and attached sacral stimulator wire extending through the left S3-S4 foramen into the posterior left pelvic wall. IMPRESSION: 1. No acute abnormality is seen with oral contrast only. 2. Transplanted kidney right iliac fossa without evidence of hydronephrosis or stone. 3. Sigmoid diverticulosis without inflammatory changes. 4. Small hiatal hernia with small umbilical and inguinal fat hernias. 5. Aortic and coronary artery atherosclerosis. 6. Severe acquired spinal stenosis at L4-5 due to advanced facet hypertrophy, ligamentous thickening and degenerative spondylolisthesis. Electronically Signed   By: Telford Nab M.D.   On: 07/09/2022 04:03     Medications:    sodium chloride 100 mL/hr at 07/10/22 1950   cefTRIAXone (ROCEPHIN)  IV 2 g (07/09/22 2049)    amLODipine  2.5 mg Oral Daily   aspirin EC  81 mg Oral Daily   carvedilol  12.5 mg Oral BID WC   cholecalciferol  2,000 Units Oral Daily   dorzolamide-timolol  1 drop Both Eyes TID   enoxaparin (LOVENOX) injection  30 mg Subcutaneous Q24H   insulin aspart  0-5 Units Subcutaneous QHS   insulin aspart  0-9 Units Subcutaneous TID WC   ketorolac  1 drop Left Eye QID   latanoprost  1 drop Right Eye QHS   multivitamin with minerals  1 tablet Oral Daily   mycophenolate  500 mg Oral BID   pantoprazole  20 mg Oral Daily   pravastatin  80 mg Oral QHS   tacrolimus  1 mg Oral Daily   And   tacrolimus  0.5 mg Oral QHS   acetaminophen **OR** acetaminophen, hydrALAZINE, HYDROcodone-acetaminophen, magnesium hydroxide, melatonin, ondansetron **OR** ondansetron  (ZOFRAN) IV, traZODone  Assessment/ Plan:  Ms. KAISLYN GULAS is a 71 y.o.  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  Patient continues to follow with [facility].  Acute kidney injury appears multifactorial from dehydration and suspected urinary tract infection.  Baseline appears to be creatinine 1.03.  CT abdomen pelvis and renal transplant ultrasound negative for obstruction.  No IV contrast exposure recently.  Creatinine has returned to baseline with IVF. Patient reports improved appetite.  Continue tacrolimus 1 mg in the morning and 0.5 mg at night and Mycophenolate 500mg  twice daily.  Patient cleared to discharge from renal stance.  Lab Results  Component Value Date   CREATININE 0.99 07/10/2022   CREATININE 1.67 (H) 07/08/2022   CREATININE 1.00 06/29/2022    Intake/Output Summary (Last 24 hours) at 07/10/2022 1434 Last data filed at 07/10/2022 0300 Gross per 24 hour  Intake 1637.1 ml  Output 1000 ml  Net 637.1 ml   2.  Hypertension with chronic kidney disease.  Home regimen includes carvedilol, hydrochlorothiazide, and amlodipine.  Hydrochlorothiazide currently held.  Blood pressure 99/62.  3. Anemia of chronic kidney disease Lab Results  Component Value Date   HGB 11.3 (L) 07/10/2022    Hemoglobin within acceptable range.  4. Urinary tract infection confirmed with urinalysis and pending urine culture.  Antibiotics managed by primary  team.   LOS: [place] 10/9/20232:34 PM

## 2022-07-10 NOTE — Plan of Care (Signed)

## 2022-07-10 NOTE — Evaluation (Signed)
Occupational Therapy Evaluation Patient Details Name: Kristen Kirk MRN: [id number] DOB: 11-12-1950 Today's Date: 07/10/2022   History of Present Illness Pt is a 71 y.o. 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   Clinical Impression   Pt seen for OT evaluation. Pt pleasant, agreeable to session and follows all commands. Pt lives with her spouse and reports indep with ADL at baseline, furniture cruises as needed for mobility. Pt completed bed mobility with mod indep, endorsing some mild lightheadedness upon sitting EOB which she endorses is baseline for her, resolving in a few minutes. Pt educated in falls prevention and positional changes. Pt sat EOB and completed UB bathing with setup and supervision. She stood from EOB with RW and stood at the sink for grooming tasks with supervision and no LOB. Pt ambulated in room with RW and supervision. Set up for breakfast in recliner. Pt demonstrates mild impairments in balance, safety, and BLE strength. Pt will benefit from skilled OT services to maximize return to PLOF. Recommend HHOT.     Recommendations for follow up therapy are one component of a multi-disciplinary discharge planning process, led by the attending physician.  Recommendations may be updated based on patient status, additional functional criteria and insurance authorization.   Follow Up Recommendations  Home health OT    Assistance Recommended at Discharge Frequent or constant Supervision/Assistance  Patient can return home with the following A little help with walking and/or transfers;A little help with bathing/dressing/bathroom;Assistance with cooking/housework;Assist for transportation;Help with stairs or ramp for entrance     Functional Status Assessment  Patient has had a recent decline in their functional status and demonstrates the ability to make significant improvements in function in a reasonable and predictable amount of time.  Equipment Recommendations  None recommended by OT    Recommendations for Other Services       Precautions / Restrictions Precautions Precautions: Fall Restrictions Weight Bearing Restrictions: No      Mobility Bed Mobility Overal bed mobility: Modified Independent                  Transfers Overall transfer level: Needs assistance Equipment used: Rolling walker (2 wheels), None Transfers: Sit to/from Stand Sit to Stand: Supervision                  Balance Overall balance assessment: Needs assistance Sitting-balance support: Feet supported Sitting balance-Leahy Scale: Good     Standing balance support: Single extremity supported, No upper extremity supported, During functional activity Standing balance-Leahy Scale: Fair                             ADL either performed or assessed with clinical judgement   ADL                                         General ADL Comments: Pt completed ADL transfers with RW,  seated UB bathing, and standing grooming tasks at the sink with supervision for safety.     Vision         Perception     Praxis      Pertinent Vitals/Pain Pain Assessment Pain Assessment: No/denies pain     Hand Dominance  Extremity/Trunk Assessment Upper Extremity Assessment Upper Extremity Assessment: Overall WFL for tasks assessed   Lower Extremity Assessment Lower Extremity Assessment: Generalized weakness   Cervical / Trunk Assessment Cervical / Trunk Assessment: Normal   Communication Communication Communication: No difficulties   Cognition Arousal/Alertness: Awake/alert Behavior During Therapy: WFL for tasks assessed/performed Overall Cognitive Status: Within Functional Limits  for tasks assessed                                       General Comments       Exercises Other Exercises Other Exercises: Pt educated in falls prevention and use of RW for ADL/mobility to improve safety/balance.   Shoulder Instructions      Home Living Family/patient expects to be discharged to:: Private residence Living Arrangements: Spouse/significant other Available Help at Discharge: Family Type of Home: House Home Access: Stairs to enter [employer] of Steps: 5 Entrance Stairs-Rails: Right;Left Home Layout: Two level;Able to live on main level with bedroom/bathroom               Home Equipment: Rolling Walker (2 wheels)   Additional Comments: pt did report at least 1 fall      Prior Functioning/Environment Prior Level of Function : Independent/Modified Independent             Mobility Comments: per pt Independent for mobility ADLs Comments: per pt I for ADLs        OT Problem List: Decreased strength;Impaired balance (sitting and/or standing);Decreased knowledge of use of DME or AE      OT Treatment/Interventions: Self-care/ADL training;Therapeutic exercise;Therapeutic activities;DME and/or AE instruction;Patient/family education;Balance training    OT Goals(Current goals can be found in the care plan section) Acute Rehab OT Goals Patient Stated Goal: go home OT Goal Formulation: With patient Time For Goal Achievement: 07/24/22 Potential to Achieve Goals: Good ADL Goals Pt Will Perform Lower Body Dressing: with modified independence;sit to/from stand Pt Will Transfer to Toilet: with modified independence;ambulating (LRAD) Pt Will Perform Toileting - Clothing Manipulation and hygiene: with modified independence Additional ADL Goal #1: Pt will complete all aspects of bathing, sitting and standing as needed, with mod indep.  OT Frequency: Min 2X/week    Co-evaluation              AM-PAC OT "6 Clicks" Daily Activity      Outcome Measure Help from another person eating meals?: None Help from another person taking care of personal grooming?: None Help from another person toileting, which includes using toliet, bedpan, or urinal?: A Little Help from another person bathing (including washing, rinsing, drying)?: A Little Help from another person to put on and taking off regular upper body clothing?: None Help from another person to put on and taking off regular lower body clothing?: A Little 6 Click Score: 21   End of Session Equipment Utilized During Treatment: Rolling walker (2 wheels) Nurse Communication: Mobility status  Activity Tolerance: Patient tolerated treatment well Patient left: in chair;with call bell/phone within reach;with chair alarm set  OT Visit Diagnosis: Other abnormalities of gait and mobility (R26.89)                Time: 7741-2878 OT Time Calculation (min): 30 min Charges:  OT General Charges $OT Visit: 1 Visit OT Evaluation $OT Eval Low Complexity: 1 Low OT Treatments $Self Care/Home Management : 23-37 mins  Ardeth Perfect., MPH, MS, OTR/L ascom [phone] 07/10/22,  9:40 AM

## 2022-07-10 NOTE — Progress Notes (Signed)
Physical Therapy Treatment Patient Details Name: Kristen Kirk MRN: [id number] DOB: 1951-06-06 Today's Date: 07/10/2022   History of Present Illness Pt is a 71 y.o. 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    PT Comments    Patient alert, agreeable to PT with minimal motivation, stated she had been up today, had sat in the chair, etc but did agree with some education. Denied pain throughout session. Educated on proper hand placement with RW during transfers with good follow through. She ambulated ~170ft with RW and CGA, no LOB and pt able to converse and scan environment throughout. Returned to room (and in bed at pt request) at end of session. The patient would benefit from further skilled PT intervention to continue to progress towards goals. Recommendation remains appropriate.    Recommendations for follow up therapy are one component of a multi-disciplinary discharge planning process, led by the attending physician.  Recommendations may be updated based on patient status, additional functional criteria and insurance authorization.  Follow Up Recommendations  Home health PT     Assistance Recommended at Discharge Frequent or constant Supervision/Assistance  Patient can return home with the following A little help with walking and/or transfers;A little help with bathing/dressing/bathroom;Assistance with cooking/housework;Assistance with feeding;Help with stairs or ramp for entrance   Equipment Recommendations  None recommended by PT    Recommendations for Other Services       Precautions / Restrictions Precautions Precautions: Fall Restrictions Weight Bearing Restrictions: No     Mobility  Bed Mobility Overal bed  mobility: Modified Independent                  Transfers Overall transfer level: Needs assistance Equipment used: Rolling walker (2 wheels) Transfers: Sit to/from Stand Sit to Stand: Supervision           General transfer comment: cued for hand placement    Ambulation/Gait Ambulation/Gait assistance: Min guard Gait Distance (Feet): 190 Feet Assistive device: Rolling walker (2 wheels)         General Gait Details: no LOB, able to converse and scan environment throughout ambulation   Stairs             Wheelchair Mobility    Modified Rankin (Stroke Patients Only)       Balance Overall balance assessment: Needs assistance Sitting-balance support: Feet supported Sitting balance-Leahy Scale: Good     Standing balance support: Single extremity supported, No upper extremity supported, During functional activity Standing balance-Leahy Scale: Fair Standing balance comment: improved steadiness with RW                            Cognition Arousal/Alertness: Awake/alert Behavior During Therapy: WFL for tasks assessed/performed Overall Cognitive Status: Within Functional Limits for tasks assessed                                          Exercises      General Comments        Pertinent Vitals/Pain Pain Assessment Pain Assessment: No/denies pain    Home Living  Prior Function            PT Goals (current goals can now be found in the care plan section) Progress towards PT goals: Progressing toward goals    Frequency    Min 2X/week      PT Plan Current plan remains appropriate    Co-evaluation              AM-PAC PT "6 Clicks" Mobility   Outcome Measure  Help needed turning from your back to your side while in a flat bed without using bedrails?: None Help needed moving from lying on your back to sitting on the side of a flat bed without using bedrails?:  None Help needed moving to and from a bed to a chair (including a wheelchair)?: None Help needed standing up from a chair using your arms (e.g., wheelchair or bedside chair)?: None Help needed to walk in hospital room?: None Help needed climbing 3-5 steps with a railing? : A Little 6 Click Score: 23    End of Session Equipment Utilized During Treatment: Gait belt Activity Tolerance: Patient tolerated treatment well Patient left: in bed;with bed alarm set;with call bell/phone within reach Nurse Communication: Mobility status PT Visit Diagnosis: Other abnormalities of gait and mobility (R26.89);Difficulty in walking, not elsewhere classified (R26.2);Muscle weakness (generalized) (M62.81)     Time: 4665-9935 PT Time Calculation (min) (ACUTE ONLY): 13 min  Charges:  $Therapeutic Activity: 8-22 mins                     Lieutenant Diego PT, DPT 2:37 PM,07/10/22

## 2022-07-10 NOTE — Progress Notes (Signed)
PROGRESS NOTE    KINSHASA THROCKMORTON  FHQ:[id number] DOB: Mar 18, 1951 DOA: 07/09/2022 PCP: Ria Bush, MD    Brief Narrative:  71 y.o. 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   Patient has memory impairment, but still can provide reasonable medical history though slightly limited.  Patient states that she has not been feeling well for more than 3 days.  She has poor appetite, decreased oral intake.  She has mild nausea, and vomited once 2 days ago, currently no vomiting or abdominal pain.  Patient denies diarrhea, but per report, patient had a massive bowel movement in the ED after drinking oral contrast.  No fever or chills.  Patient does not have chest pain, cough, shortness of breath.  Denies symptoms of UTI.  Patient moves all extremities normally.  No facial droop or slurred speech.  Patient states that she had slowly rolled out of the bed 3 days ago without any injury.  No loss of consciousness.   Of note, she recently had surgery by Dr. Leighton Ruff (insertion of a sacral nerve stimulator for fecal incontinence).  This was a little over a week ago.      Assessment & Plan:   Principal Problem:   Acute renal failure superimposed on stage 3a chronic kidney disease (HCC) Active Problems:   History of renal transplant   Anemia in chronic kidney disease   Essential hypertension   HLD (hyperlipidemia)   Hypokalemia   Incontinence of feces   Sepsis ([place])   UTI (urinary tract infection)   Type II diabetes mellitus with renal manifestations ([place])   Fall at home, initial encounter  * Acute renal failure superimposed on stage 3a chronic kidney disease ([place]) Likely due to dehydration, poor appetite and decreased oral intake for several days.   Urinalysis  positive for UTI, but sample is contaminated with squamous cells.   Creatinine back to baseline 10/9 Plan: Continue IVF Continue Rocephin Follow urine culture Anticipated discharge 10/10    History of renal transplant Pt is following up with renal at [facility] Plan: Continue current immunosuppressive regimen.  Discussed with nephrology.    Anemia in chronic kidney disease Hemoglobin at or near baseline   Essential hypertension - IV hydralazine as needed Continue home Norvasc, Coreg   HLD (hyperlipidemia) Continue home statin  Hypokalemia Monitor and replace as necessary   Incontinence of feces S/p of sacral nerve stimulator recently.   Patient had a massive bowel movement after drinking oral contrast the ED. No indication for C. difficile or GI PCR panel   Sepsis ([place]) Sepsis due to possible UTI Urinalysis is positive for UTI, but with squamous cell contamination.   Patient meets criteria for sepsis with WBC 13.0, RR 29.   Plan: -Continue IV Rocephin -Follow-up blood culture and urine culture, no growth to date   UTI (urinary tract infection) -see above   Type II diabetes mellitus with renal manifestations ([place]) Diet-controlled diabetes: recent A1c 7.7, poorly controlled.   Patient is not taking medications currently -Sliding scale insulin   Fall at home, initial encounter Patient states that she slightly rolled out of the bed, without any injury. -PT/OT Home with home health  DVT prophylaxis: SQ Lovenox Code Status: Full Family Communication: None today  disposition Plan: Status is: Inpatient Remains inpatient appropriate because: UTI on IV antibiotics and IV fluids.  Anticipate discharge 10/10  Level of care: Med-Surg  Consultants:  Nephrology  Procedures:  None  Antimicrobials: Rocephin    Subjective: Seen and examined.  Sitting up in chair.  No visible distress.  No complaints of pain.  Objective: Vitals:   07/09/22 1838 07/09/22 2053  07/10/22 0522 07/10/22 0849  BP: (!) 126/97 (!) 158/65 (!) 144/88 (!) 167/72  Pulse: 65 63 72 69  Resp: 18 19 17 17   Temp: 97.8 F (36.6 C) 97.9 F (36.6 C) 98.4 F (36.9 C) 98.6 F (37 C)  TempSrc:      SpO2: 100% 100% 100% 100%  Weight:      Height:        Intake/Output Summary (Last 24 hours) at 07/10/2022 1101 Last data filed at 07/10/2022 0300 Gross per 24 hour  Intake 1637.1 ml  Output 1000 ml  Net 637.1 ml   Filed Weights   07/08/22 2102  Weight: 59 kg    Examination:  General exam: Appears calm and comfortable  Respiratory system: Clear to auscultation. Respiratory effort normal. Cardiovascular system: S1-S2, RRR, no murmurs, no pedal edema Gastrointestinal system: Soft, NT/ND, normal bowel sounds Central nervous system: Alert and oriented. No focal neurological deficits. Extremities: Symmetric 5 x 5 power. Skin: No rashes, lesions or ulcers Psychiatry: Judgement and insight appear normal. Mood & affect appropriate.     Data Reviewed: I have personally reviewed following labs and imaging studies  CBC: Recent Labs  Lab 07/08/22 2107 07/10/22 0403  WBC 13.0* 7.7  HGB 11.8* 11.3*  HCT 38.1 36.6  MCV 83.9 83.8  PLT 221 563   Basic Metabolic Panel: Recent Labs  Lab 07/08/22 2107 07/08/22 2109 07/10/22 0403  NA 132*  --  140  K 3.4*  --  3.7  CL 100  --  109  CO2 21*  --  24  GLUCOSE 239*  --  116*  BUN 37*  --  23  CREATININE 1.67*  --  0.99  CALCIUM 9.2  --  9.2  MG  --  1.9  --    GFR: Estimated Creatinine Clearance: 44.4 mL/min (by C-G formula based on SCr of 0.99 mg/dL). Liver Function Tests: No results for input(s): "AST", "ALT", "ALKPHOS", "BILITOT", "PROT", "ALBUMIN" in the last 168 hours. No results for input(s): "LIPASE", "AMYLASE" in the last 168 hours. No results for input(s): "AMMONIA" in the last 168 hours. Coagulation Profile: Recent Labs  Lab 07/10/22 0403  INR 1.2   Cardiac Enzymes: No results for input(s): "CKTOTAL",  "CKMB", "CKMBINDEX", "TROPONINI" in the last 168 hours. BNP (last 3 results) No results for input(s): "PROBNP" in the last 8760 hours. HbA1C: No results for input(s): "HGBA1C" in the last 72 hours. CBG: Recent Labs  Lab 07/09/22 0816 07/09/22 1204 07/09/22 1742 07/09/22 2253 07/10/22 0850  GLUCAP 110* 154* 152* 129* 112*   Lipid Profile: No results for input(s): "CHOL", "HDL", "LDLCALC", "TRIG", "CHOLHDL", "LDLDIRECT" in the last 72 hours. Thyroid Function Tests: No results for input(s): "TSH", "T4TOTAL", "FREET4", "T3FREE", "THYROIDAB" in the last 72 hours. Anemia Panel: No results for input(s): "VITAMINB12", "FOLATE", "FERRITIN", "TIBC", "IRON", "RETICCTPCT" in the last 72 hours. Sepsis Labs: Recent Labs  Lab 07/08/22 2109 07/09/22 1901 07/09/22 2117 07/10/22 0403  PROCALCITON 0.71  --   --  0.30  LATICACIDVEN  --  1.3 1.3  --     Recent Results (from the past 240 hour(s))  Urine Culture     Status: Abnormal (Preliminary result)   Collection Time: 07/08/22  9:06 PM  Specimen: Urine, Clean Catch  Result Value Ref Range Status   Specimen Description   Final    URINE, CLEAN CATCH Performed at [facility], [address], [place], [place] [address]    Special Requests   Final    NONE Performed at [facility], [address] [place], [place] [address]    Culture (A)  Final    >=100,000 COLONIES/mL KLEBSIELLA PNEUMONIAE SUSCEPTIBILITIES TO FOLLOW Performed at [facility], [place] [address], [place], [place] [address]    Report Status PENDING  Incomplete  Blood Culture (routine x 2)     Status: None (Preliminary result)   Collection Time: 07/09/22  1:51 AM   Specimen: BLOOD  Result Value Ref Range Status   Specimen Description BLOOD BLOOD RIGHT ARM  Final   Special Requests   Final    BOTTLES DRAWN AEROBIC AND ANAEROBIC Blood Culture results may not be optimal due to an inadequate volume of blood received in culture bottles   Culture    Final    NO GROWTH 1 DAY Performed at [facility], [address], [place], [place] [address]    Report Status PENDING  Incomplete  Blood Culture (routine x 2)     Status: None (Preliminary result)   Collection Time: 07/09/22  1:52 AM   Specimen: BLOOD  Result Value Ref Range Status   Specimen Description BLOOD BLOOD RIGHT FOREARM  Final   Special Requests   Final    BOTTLES DRAWN AEROBIC AND ANAEROBIC Blood Culture results may not be optimal due to an inadequate volume of blood received in culture bottles   Culture   Final    NO GROWTH 1 DAY Performed at [facility], [address], [place], [place] [address]    Report Status PENDING  Incomplete         Radiology Studies: US Renal Transplant w/Doppler  Result Date: 07/09/2022 CLINICAL DATA:  Acute kidney injury. Urinary tract infection. Renal transplant approximately 5 years ago. EXAM: ULTRASOUND OF RENAL TRANSPLANT WITH RENAL DOPPLER ULTRASOUND TECHNIQUE: Ultrasound examination of the renal transplant was performed with gray-scale, color and duplex doppler evaluation. COMPARISON:  None Available. FINDINGS: Transplant kidney location: Right lower quadrant Transplant Kidney: Renal measurements: 11.2 x 5.8 x 5.6 cm = volume: 184mL. Normal in size and parenchymal echogenicity. No evidence of mass or hydronephrosis. No peri-transplant fluid collection seen. Color flow in the main renal artery:  Yes Color flow in the main renal vein:  Yes Duplex Doppler Evaluation: Intrarenal resistive index in upper pole:  0.72 (normal 0.6-0.8; equivocal 0.8-0.9; abnormal >= 0.9) Intrarenal resistive index in lower pole: 0.76 (normal 0.6-0.8; equivocal 0.8-0.9; abnormal >= 0.9) Bladder: Normal for degree of bladder distention. Other findings:  None. IMPRESSION: Normal appearance of renal transplant in right iliac fossa. No evidence of mass or hydronephrosis. Intrarenal resistive index values are within normal limits. Electronically Signed    By: Marlaine Hind M.D.   On: 07/09/2022 09:25   CT ABDOMEN PELVIS WO CONTRAST  Result Date: 07/09/2022 CLINICAL DATA:  Kidney failure with right-sided transplant and UTI. Recent bowel surgery. Now with UTI and AKI. EXAM: CT ABDOMEN AND PELVIS WITHOUT IV CONTRAST WITH ORAL CONTRAST ONLY TECHNIQUE: Multidetector CT imaging of the abdomen and pelvis was performed following the standard protocol without IV contrast. RADIATION DOSE REDUCTION: This exam was performed according to the departmental dose-optimization program which includes automated exposure control, adjustment of the mA and/or kV according to patient size  and/or use of iterative reconstruction technique. COMPARISON:  Ultrasound complete abdomen 04/08/2021. No prior CT for comparison. FINDINGS: Technical note: Respiratory motion limits evaluation of the lung bases and abdomen. Lower chest: No lung base infiltrate is seen. Small hiatal hernia. The cardiac size is normal. Trace calcification right coronary artery. Hepatobiliary: No focal liver abnormality is visible through the breathing motion. The gallbladder and bile ducts are unremarkable, as visualized. Pancreas: No focal abnormality is seen through the breathing motion. Spleen: No focal abnormality is seen through the breathing motion. No splenomegaly. Small splenule lateral to the spleen. Adrenals/Urinary Tract: There is no adrenal mass. Severe chronic atrophy both kidneys is noted with no focal cortical abnormality seen through the breathing motion. No urinary stone or hydronephrosis is seen. There is a transplanted kidney in the right iliac fossa, multiple surgical clips clustered along the hilum of the transplant kidney and extending along the right external iliac vessels. The transplant ureteral insertion appears to be along the right anterolateral bladder wall and no urinary calculus or hydronephrosis is seen. The transplant renal cortex is unremarkable without contrast and there is no bladder  thickening. Stomach/Bowel: Small hiatal hernia. No dilatation or wall thickening including the appendix. Advanced diverticular disease distal descending and sigmoid colon without acute inflammatory change. Vascular/Lymphatic: Aortic atherosclerosis. No enlarged abdominal or pelvic lymph nodes. Reproductive: The uterus and ovaries are not enlarged. Other: There are small umbilical and inguinal fat hernias. There is no free air, free fluid or free hemorrhage. There is no abscess or acute inflammatory change. Musculoskeletal: There are degenerative changes of the thoracic and lumbar spine with multilevel lumbar spinous process abutment and spurring and with advanced L4-5 facet hypertrophy and severe acquired spinal canal stenosis with grade 1 L4-5 spondylolisthesis. Metallic artifact is seen a lower right flank implanted power source and attached sacral stimulator wire extending through the left S3-S4 foramen into the posterior left pelvic wall. IMPRESSION: 1. No acute abnormality is seen with oral contrast only. 2. Transplanted kidney right iliac fossa without evidence of hydronephrosis or stone. 3. Sigmoid diverticulosis without inflammatory changes. 4. Small hiatal hernia with small umbilical and inguinal fat hernias. 5. Aortic and coronary artery atherosclerosis. 6. Severe acquired spinal stenosis at L4-5 due to advanced facet hypertrophy, ligamentous thickening and degenerative spondylolisthesis. Electronically Signed   By: Telford Nab M.D.   On: 07/09/2022 04:03        Scheduled Meds:  amLODipine  2.5 mg Oral Daily   aspirin EC  81 mg Oral Daily   carvedilol  12.5 mg Oral BID WC   cholecalciferol  2,000 Units Oral Daily   dorzolamide-timolol  1 drop Both Eyes TID   enoxaparin (LOVENOX) injection  30 mg Subcutaneous Q24H   insulin aspart  0-5 Units Subcutaneous QHS   insulin aspart  0-9 Units Subcutaneous TID WC   ketorolac  1 drop Left Eye QID   latanoprost  1 drop Right Eye QHS   multivitamin  with minerals  1 tablet Oral Daily   mycophenolate  500 mg Oral BID   pantoprazole  20 mg Oral Daily   pravastatin  80 mg Oral QHS   tacrolimus  1 mg Oral Daily   And   tacrolimus  0.5 mg Oral QHS   Continuous Infusions:  sodium chloride 100 mL/hr at 07/10/22 0928   cefTRIAXone (ROCEPHIN)  IV 2 g (07/09/22 2049)     LOS: 1 day   Sidney Ace, MD [facility]   If 7PM-7AM, please contact  night-coverage  07/10/2022, 11:01 AM

## 2022-07-10 NOTE — TOC Initial Note (Signed)
Transition of Care [facility]) - Initial/Assessment Note    Patient Details  Name: Kristen Kirk MRN: [id number] Date of Birth: [birth year]  [facility]) CM/SW Contact:    Colen Darling, [place] Phone Number: 07/10/2022, 4:06 PM  Clinical Narrative:                  SW met with the patient and spouse at bedside. The patient lives with her husband Iona Beard in a single family home. [facility] [place], [place]. PCP is Dr. Ria Bush at [facility]. Patient has not had prior Home Health. DME in the home includes a walker, commode and cane. She drives her own car to appointments.   Expected Discharge Plan: [place]     Patient Goals and CMS Choice Patient states their goals for this hospitalization and ongoing recovery are:: to get stronger      Expected Discharge Plan and Services Expected Discharge Plan: [place] Acute Care Choice: [place] arrangements for the past 2 months: Single Family Home                                      Prior Living Arrangements/Services Living arrangements for the past 2 months: Single Family Home Lives with:: Spouse              Current home services: DME    Activities of Daily Living Home Assistive Devices/Equipment: Eyeglasses ADL Screening (condition at time of admission) Patient's cognitive ability adequate to safely complete daily activities?: Yes Is the patient deaf or have difficulty hearing?: No Does the patient have difficulty seeing, even when wearing glasses/contacts?: Yes Does the patient have difficulty concentrating, remembering, or making decisions?: No Patient able to express need for assistance with ADLs?: Yes Does the patient have difficulty dressing or bathing?: No Independently performs ADLs?: Yes (appropriate for developmental age) Does the patient have difficulty walking or climbing stairs?: No Weakness of Legs:  Both Weakness of Arms/Hands: None  Permission Sought/Granted Permission sought to share information with : Case Manager, Family Supports                Emotional Assessment Appearance:: Appears stated age     Orientation: : Oriented to Self, Oriented to Place, Oriented to  Time, Oriented to Situation      Admission diagnosis:  Immunocompromised state ([place]) [D84.9] AKI (acute kidney injury) ([place]) [N17.9] Acute kidney injury ([place]) [N17.9] History of renal transplant [Z94.0] Urinary tract infection without hematuria, site unspecified [N39.0] Incontinence of feces, unspecified fecal incontinence type [R15.9] Patient Active Problem List   Diagnosis Date Noted   Acute renal failure superimposed on stage 3a chronic kidney disease ([place]) 07/09/2022   Anemia in chronic kidney disease    Essential hypertension    UTI (urinary tract infection)    HLD (hyperlipidemia)    Type II diabetes mellitus with renal manifestations ([place])    Hypokalemia    Sepsis ([place])    Fall at home, initial encounter    Immunosuppressed status ([place]) 04/15/2022   Health maintenance examination 04/14/2022   Blurry vision 04/08/2021   Constipation due to outlet dysfunction    Acquired leg length discrepancy 01/28/2021   ASCUS of cervix with negative high risk HPV 10/01/2020   Thoracic spine pain 01/05/2020   Chronic lower back pain 10/08/2019   Osteoporosis  05/03/2019   Advanced care planning/counseling discussion 03/20/2019   Memory impairment 03/20/2019   Right shoulder pain 06/19/2017   History of renal transplant 04/01/2017   Rectal bleeding 01/11/2017   Incontinence of feces 01/11/2017    Class: Acute   Insomnia 01/14/2013   Xerostomia 01/29/2012   Medicare annual wellness visit, subsequent 07/20/2011   Vaginal atrophy 04/17/2011   Type 2 diabetes mellitus with other specified complication ([place])    Hyperlipidemia associated with type 2 diabetes mellitus ([place])    GERD (gastroesophageal reflux  disease)    Mixed incontinence urge and stress    Vitamin D deficiency 05/19/2009   MUSCLE CRAMPS 02/22/2009   ALOPECIA 10/05/2008   Vertigo 07/31/2008   OSTEOARTHRITIS- EARLY T11-12, T12-L1  4/05 01/03/2007   Primary open angle glaucoma (POAG) of both eyes, severe stage 10/03/1999   PCP:  Ria Bush, MD Pharmacy:   [facility] [address] [place] [address] [place] [address] Phone: [phone] Fax: [phone]  [id number] [place] [id number]: [phone]: [phone]    Social Determinants of Health (SDOH) Interventions    Readmission Risk Interventions     No data to display

## 2022-07-11 DIAGNOSIS — N179 Acute kidney failure, unspecified: Secondary | ICD-10-CM | POA: Diagnosis not present

## 2022-07-11 DIAGNOSIS — N1831 Chronic kidney disease, stage 3a: Secondary | ICD-10-CM | POA: Diagnosis not present

## 2022-07-11 DIAGNOSIS — R262 Difficulty in walking, not elsewhere classified: Secondary | ICD-10-CM | POA: Diagnosis not present

## 2022-07-11 LAB — URINE CULTURE: Culture: >=100000 — AB

## 2022-07-11 LAB — TACROLIMUS LEVEL: Tacrolimus (FK506) - LabCorp: 12.6 ng/mL (ref 2.0–20.0)

## 2022-07-11 LAB — GLUCOSE, CAPILLARY: Glucose-Capillary: 112 mg/dL — ABNORMAL HIGH (ref 70–99)

## 2022-07-11 MED ORDER — ENOXAPARIN SODIUM 40 MG/0.4ML IJ SOSY
40.0000 mg | PREFILLED_SYRINGE | INTRAMUSCULAR | Status: DC
  Administered 2022-07-11: 40 mg via SUBCUTANEOUS
  Filled 2022-07-11: qty 0.4

## 2022-07-11 MED ORDER — SODIUM CHLORIDE 0.9 % IV SOLN
2.0000 g | INTRAVENOUS | Status: AC
  Administered 2022-07-11: 2 g via INTRAVENOUS
  Filled 2022-07-11: qty 20
  Filled 2022-07-11: qty 2

## 2022-07-11 NOTE — Progress Notes (Signed)
Met with the patient in the room She has her husbands RW but will need one of her own [employer] to deliver to the room Alvis Lemmings accepted the patient for [facility] Her husband will provide transportation

## 2022-07-11 NOTE — Discharge Summary (Signed)
Physician Discharge Summary  Kristen Kirk KCM:[id number] DOB: 1951-02-04 DOA: 07/09/2022  PCP: Ria Bush, MD  Admit date: 07/09/2022 Discharge date: 07/11/2022  Admitted From: Home Disposition:  Home  Recommendations for Outpatient Follow-up:  Follow up with PCP in 1-2 weeks Follow-up with transplant nephrology at [facility]: No Equipment/Devices: None  Discharge Condition: Stable CODE STATUS: Full Diet recommendation: Regular  Brief/Interim Summary:  71 y.o. 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   Patient has memory impairment, but still can provide reasonable medical history though slightly limited.  Patient states that she has not been feeling well for more than 3 days.  She has poor appetite, decreased oral intake.  She has mild nausea, and vomited once 2 days ago, currently no vomiting or abdominal pain.  Patient denies diarrhea, but per report, patient had a massive bowel movement in the ED after drinking oral contrast.  No fever or chills.  Patient does not have chest pain, cough, shortness of breath.  Denies symptoms of UTI.  Patient moves all extremities normally.  No facial droop or slurred speech.  Patient states that she had slowly rolled out of the bed 3 days ago without any injury.  No loss of consciousness.   Of note, she recently had surgery by Dr. Leighton Ruff (insertion of a sacral nerve stimulator for fecal incontinence).  This was a little over a week ago.   Seen in consultation by nephrology.  No specific medication changes from their standpoint.  Kidney function returned to baseline.  Can resume home immunosuppressive therapy.  Completed 3 days of Rocephin for Klebsiella UTI.  Stable for DC home at this time.  No  changes made at home medication regimen.  Follow-up outpatient PCP and transplant nephrology at [facility]   Discharge Diagnoses:  Principal Problem:   Acute renal failure superimposed on stage 3a chronic kidney disease ([place]) Active Problems:   History of renal transplant   Anemia in chronic kidney disease   Essential hypertension   HLD (hyperlipidemia)   Hypokalemia   Incontinence of feces   Sepsis ([place])   UTI (urinary tract infection)   Type II diabetes mellitus with renal manifestations ([place])   Fall at home, initial encounter * Acute renal failure superimposed on stage 3a chronic kidney disease ([place]) Likely due to dehydration, poor appetite and decreased oral intake for several days.   Urinalysis positive for UTI, but sample is contaminated with squamous cells.   Creatinine back to baseline 10/9 Plan: DC home.  Follow-up outpatient with [facility] transplant nephrology   History of renal transplant Pt is following up with renal at [facility] Plan: Continue current immunosuppressive regimen.  Discussed with nephrology.  Sepsis ([place]) Sepsis due to Klebsiella UTI Urinalysis is positive for UTI, but with squamous cell contamination.   Patient meets criteria for sepsis with WBC 13.0, RR 29.   Plan: Urine culture with Klebsiella.  Sensitivities pending.  Completed 3 days of Rocephin.  No signs of worsening infection.  Discontinue antibiotics at this time.   Discharge Instructions  Discharge Instructions     Diet - low sodium heart healthy   Complete by: As directed    Increase activity slowly   Complete by: As directed    No wound care   Complete by: As directed       Allergies as of 07/11/2022  Reactions   Brimonidine    Other reaction(s): Other (See Comments) eye redness   Shellfish-derived Products Swelling   Tramadol Other (See Comments)   Dizziness even at 1/2 tab        Medication List     STOP taking these medications    alendronate 70 MG tablet Commonly  known as: FOSAMAX   HYDROcodone-acetaminophen 5-325 MG tablet Commonly known as: NORCO/VICODIN       TAKE these medications    amLODipine 2.5 MG tablet Commonly known as: NORVASC Take 2.5 mg by mouth daily.   aspirin 81 MG tablet Take 81 mg by mouth daily.   carvedilol 25 MG tablet Commonly known as: COREG Take 12.5 mg by mouth 2 (two) times daily with a meal.   docusate sodium 100 MG capsule Commonly known as: COLACE Take 100 mg by mouth 2 (two) times daily as needed for mild constipation or moderate constipation.   glucose blood test strip Check sugars once daily   hydrochlorothiazide 12.5 MG tablet Commonly known as: HYDRODIURIL Take 1 tablet (12.5 mg total) by mouth daily.   ketorolac 0.5 % ophthalmic solution Commonly known as: ACULAR Place 1 drop into the left eye 4 (four) times daily.   latanoprost 0.005 % ophthalmic solution Commonly known as: XALATAN Place 1 drop into the right eye at bedtime.   Melatonin 3 MG Caps Take 1-2 capsules (3-6 mg total) by mouth at bedtime as needed (sleep).   mirabegron ER 25 MG Tb24 tablet Commonly known as: MYRBETRIQ Take 25 mg by mouth daily.   multivitamin tablet Take 1 tablet by mouth daily.   mycophenolate 500 MG tablet Commonly known as: CELLCEPT Take 500 mg by mouth 2 (two) times daily.   ONE TOUCH ULTRA SYSTEM KIT w/Device Kit 1 kit by Does not apply route once.   pantoprazole 20 MG tablet Commonly known as: PROTONIX TAKE 1 TABLET BY MOUTH EVERY DAY   pravastatin 80 MG tablet Commonly known as: PRAVACHOL TAKE 1 TABLET BY MOUTH  EVERY EVENING What changed: when to take this   prednisoLONE acetate 1 % ophthalmic suspension Commonly known as: PRED FORTE Place 1 drop into the left eye every 2 (two) hours while awake.   tacrolimus 0.5 MG capsule Commonly known as: PROGRAF Take 0.5-1 mg by mouth See admin instructions. Take 2 capsules (45m) by mouth every morning and take 1 capsule (0.569m by mouth every  night   Vitamin D3 25 MCG (1000 UT) Caps Take 2 capsules (2,000 Units total) by mouth daily.               Durable Medical Equipment  (From admission, onward)           Start     Ordered   07/11/22 0907  For home use only DME Walker rolling  Once       Question Answer Comment  Walker: With 5 Inch Wheels   Patient needs a walker to treat with the following condition Impaired mobility      07/11/22 0907            Allergies  Allergen Reactions   Brimonidine     Other reaction(s): Other (See Comments) eye redness   Shellfish-Derived Products Swelling   Tramadol Other (See Comments)    Dizziness even at 1/2 tab    Consultations: Nephrology   Procedures/Studies: [place] Transplant w/Doppler  Result Date: 07/09/2022 CLINICAL DATA:  Acute kidney injury. Urinary tract infection. Renal transplant approximately 5 years ago.  EXAM: ULTRASOUND OF RENAL TRANSPLANT WITH RENAL DOPPLER ULTRASOUND TECHNIQUE: Ultrasound examination of the renal transplant was performed with gray-scale, color and duplex doppler evaluation. COMPARISON:  None Available. FINDINGS: Transplant kidney location: Right lower quadrant Transplant Kidney: Renal measurements: 11.2 x 5.8 x 5.6 cm = volume: 157m. Normal in size and parenchymal echogenicity. No evidence of mass or hydronephrosis. No peri-transplant fluid collection seen. Color flow in the main renal artery:  Yes Color flow in the main renal vein:  Yes Duplex Doppler Evaluation: Intrarenal resistive index in upper pole:  0.72 (normal 0.6-0.8; equivocal 0.8-0.9; abnormal >= 0.9) Intrarenal resistive index in lower pole: 0.76 (normal 0.6-0.8; equivocal 0.8-0.9; abnormal >= 0.9) Bladder: Normal for degree of bladder distention. Other findings:  None. IMPRESSION: Normal appearance of renal transplant in right iliac fossa. No evidence of mass or hydronephrosis. Intrarenal resistive index values are within normal limits. Electronically Signed   By: JMarlaine HindM.D.   On: 07/09/2022 09:25   CT ABDOMEN PELVIS WO CONTRAST  Result Date: 07/09/2022 CLINICAL DATA:  Kidney failure with right-sided transplant and UTI. Recent bowel surgery. Now with UTI and AKI. EXAM: CT ABDOMEN AND PELVIS WITHOUT IV CONTRAST WITH ORAL CONTRAST ONLY TECHNIQUE: Multidetector CT imaging of the abdomen and pelvis was performed following the standard protocol without IV contrast. RADIATION DOSE REDUCTION: This exam was performed according to the departmental dose-optimization program which includes automated exposure control, adjustment of the mA and/or kV according to patient size and/or use of iterative reconstruction technique. COMPARISON:  Ultrasound complete abdomen 04/08/2021. No prior CT for comparison. FINDINGS: Technical note: Respiratory motion limits evaluation of the lung bases and abdomen. Lower chest: No lung base infiltrate is seen. Small hiatal hernia. The cardiac size is normal. Trace calcification right coronary artery. Hepatobiliary: No focal liver abnormality is visible through the breathing motion. The gallbladder and bile ducts are unremarkable, as visualized. Pancreas: No focal abnormality is seen through the breathing motion. Spleen: No focal abnormality is seen through the breathing motion. No splenomegaly. Small splenule lateral to the spleen. Adrenals/Urinary Tract: There is no adrenal mass. Severe chronic atrophy both kidneys is noted with no focal cortical abnormality seen through the breathing motion. No urinary stone or hydronephrosis is seen. There is a transplanted kidney in the right iliac fossa, multiple surgical clips clustered along the hilum of the transplant kidney and extending along the right external iliac vessels. The transplant ureteral insertion appears to be along the right anterolateral bladder wall and no urinary calculus or hydronephrosis is seen. The transplant renal cortex is unremarkable without contrast and there is no bladder thickening.  Stomach/Bowel: Small hiatal hernia. No dilatation or wall thickening including the appendix. Advanced diverticular disease distal descending and sigmoid colon without acute inflammatory change. Vascular/Lymphatic: Aortic atherosclerosis. No enlarged abdominal or pelvic lymph nodes. Reproductive: The uterus and ovaries are not enlarged. Other: There are small umbilical and inguinal fat hernias. There is no free air, free fluid or free hemorrhage. There is no abscess or acute inflammatory change. Musculoskeletal: There are degenerative changes of the thoracic and lumbar spine with multilevel lumbar spinous process abutment and spurring and with advanced L4-5 facet hypertrophy and severe acquired spinal canal stenosis with grade 1 L4-5 spondylolisthesis. Metallic artifact is seen a lower right flank implanted power source and attached sacral stimulator wire extending through the left S3-S4 foramen into the posterior left pelvic wall. IMPRESSION: 1. No acute abnormality is seen with oral contrast only. 2. Transplanted kidney right iliac fossa without evidence  of hydronephrosis or stone. 3. Sigmoid diverticulosis without inflammatory changes. 4. Small hiatal hernia with small umbilical and inguinal fat hernias. 5. Aortic and coronary artery atherosclerosis. 6. Severe acquired spinal stenosis at L4-5 due to advanced facet hypertrophy, ligamentous thickening and degenerative spondylolisthesis. Electronically Signed   By: Telford Nab M.D.   On: 07/09/2022 04:03   DG C-Arm 1-60 Min-No Report  Result Date: 06/16/2022 Fluoroscopy was utilized by the requesting physician.  No radiographic interpretation.      Subjective: Seen and examined on day of discharge.  Stable no distress.  Spouse at bedside.  Stable for DC home.  Discharge Exam: Vitals:   07/10/22 2350 07/11/22 0700  BP: (!) 150/84 (!) 161/76  Pulse:  72  Resp:    Temp:  98.5 F (36.9 C)  SpO2:  100%   Vitals:   07/10/22 1546 07/10/22 2324  07/10/22 2350 07/11/22 0700  BP: (!) 153/69 (!) 180/73 (!) 150/84 (!) 161/76  Pulse: 66 66  72  Resp: 16 20    Temp: 98.5 F (36.9 C) 98.3 F (36.8 C)  98.5 F (36.9 C)  TempSrc:      SpO2: 100% 100%  100%  Weight:      Height:        General: Pt is alert, awake, not in acute distress Cardiovascular: RRR, S1/S2 +, no rubs, no gallops Respiratory: CTA bilaterally, no wheezing, no rhonchi Abdominal: Soft, NT, ND, bowel sounds + Extremities: no edema, no cyanosis    The results of significant diagnostics from this hospitalization (including imaging, microbiology, ancillary and laboratory) are listed below for reference.     Microbiology: Recent Results (from the past 240 hour(s))  Urine Culture     Status: Abnormal   Collection Time: 07/08/22  9:06 PM   Specimen: Urine, Clean Catch  Result Value Ref Range Status   Specimen Description   Final    URINE, CLEAN CATCH Performed at [facility], [address], [place], [place] [address]    Special Requests   Final    NONE Performed at [facility], [place], [place] [address]    Culture >=100,000 COLONIES/mL KLEBSIELLA PNEUMONIAE (A)  Final   Report Status 07/11/2022 FINAL  Final   Organism ID, Bacteria KLEBSIELLA PNEUMONIAE (A)  Final      Susceptibility   Klebsiella pneumoniae - MIC*    AMPICILLIN >=32 RESISTANT Resistant     CEFAZOLIN <=4 SENSITIVE Sensitive     CEFEPIME <=0.12 SENSITIVE Sensitive     CEFTRIAXONE <=0.25 SENSITIVE Sensitive     CIPROFLOXACIN <=0.25 SENSITIVE Sensitive     GENTAMICIN <=1 SENSITIVE Sensitive     IMIPENEM <=0.25 SENSITIVE Sensitive     NITROFURANTOIN 128 RESISTANT Resistant     TRIMETH/SULFA <=20 SENSITIVE Sensitive     AMPICILLIN/SULBACTAM 4 SENSITIVE Sensitive     PIP/TAZO <=4 SENSITIVE Sensitive     * >=100,000 COLONIES/mL KLEBSIELLA PNEUMONIAE  Blood Culture (routine x 2)     Status: None (Preliminary result)   Collection Time: 07/09/22  1:51 AM    Specimen: BLOOD  Result Value Ref Range Status   Specimen Description BLOOD BLOOD RIGHT ARM  Final   Special Requests   Final    BOTTLES DRAWN AEROBIC AND ANAEROBIC Blood Culture results may not be optimal due to an inadequate volume of blood received in culture bottles   Culture   Final    NO GROWTH 2 DAYS Performed at [facility], [address] [place], [place] [address]    Report Status PENDING  Incomplete  Blood Culture (routine x 2)     Status: None (Preliminary result)   Collection Time: 07/09/22  1:52 AM   Specimen: BLOOD  Result Value Ref Range Status   Specimen Description BLOOD BLOOD RIGHT FOREARM  Final   Special Requests   Final    BOTTLES DRAWN AEROBIC AND ANAEROBIC Blood Culture results may not be optimal due to an inadequate volume of blood received in culture bottles   Culture   Final    NO GROWTH 2 DAYS Performed at [facility], [address] [place], [place] [address]    Report Status PENDING  Incomplete     Labs: BNP (last 3 results) No results for input(s): "BNP" in the last 8760 hours. Basic Metabolic Panel: Recent Labs  Lab 07/08/22 2107 07/08/22 2109 07/10/22 0403  NA 132*  --  140  K 3.4*  --  3.7  CL 100  --  109  CO2 21*  --  24  GLUCOSE 239*  --  116*  BUN 37*  --  23  CREATININE 1.67*  --  0.99  CALCIUM 9.2  --  9.2  MG  --  1.9  --    Liver Function Tests: No results for input(s): "AST", "ALT", "ALKPHOS", "BILITOT", "PROT", "ALBUMIN" in the last 168 hours. No results for input(s): "LIPASE", "AMYLASE" in the last 168 hours. No results for input(s): "AMMONIA" in the last 168 hours. CBC: Recent Labs  Lab 07/08/22 2107 07/10/22 0403  WBC 13.0* 7.7  HGB 11.8* 11.3*  HCT 38.1 36.6  MCV 83.9 83.8  PLT 221 230   Cardiac Enzymes: No results for input(s): "CKTOTAL", "CKMB", "CKMBINDEX", "TROPONINI" in the last 168 hours. BNP: Invalid input(s): "POCBNP" CBG: Recent Labs  Lab 07/10/22 0850 07/10/22 1141  07/10/22 1723 07/10/22 2153 07/11/22 0742  GLUCAP 112* 231* 118* 151* 112*   D-Dimer No results for input(s): "DDIMER" in the last 72 hours. Hgb A1c No results for input(s): "HGBA1C" in the last 72 hours. Lipid Profile No results for input(s): "CHOL", "HDL", "LDLCALC", "TRIG", "CHOLHDL", "LDLDIRECT" in the last 72 hours. Thyroid function studies No results for input(s): "TSH", "T4TOTAL", "T3FREE", "THYROIDAB" in the last 72 hours.  Invalid input(s): "FREET3" Anemia work up No results for input(s): "VITAMINB12", "FOLATE", "FERRITIN", "TIBC", "IRON", "RETICCTPCT" in the last 72 hours. Urinalysis    Component Value Date/Time   COLORURINE AMBER (A) 07/08/2022 2107   APPEARANCEUR CLOUDY (A) 07/08/2022 2107   APPEARANCEUR Clear 10/23/2013 2127   LABSPEC 1.017 07/08/2022 2107   LABSPEC 1.006 10/23/2013 2127   PHURINE 5.0 07/08/2022 2107   GLUCOSEU NEGATIVE 07/08/2022 2107   GLUCOSEU Negative 10/23/2013 2127   HGBUR SMALL (A) 07/08/2022 2107   BILIRUBINUR NEGATIVE 07/08/2022 2107   BILIRUBINUR negative 12/06/2021 0806   BILIRUBINUR Negative 10/23/2013 2127   [place] NEGATIVE 07/08/2022 2107   PROTEINUR 30 (A) 07/08/2022 2107   UROBILINOGEN 0.2 12/06/2021 0806   NITRITE NEGATIVE 07/08/2022 2107   LEUKOCYTESUR LARGE (A) 07/08/2022 2107   LEUKOCYTESUR 1+ 10/23/2013 2127   Sepsis Labs Recent Labs  Lab 07/08/22 2107 07/10/22 0403  WBC 13.0* 7.7   Microbiology Recent Results (from the past 240 hour(s))  Urine Culture     Status: Abnormal   Collection Time: 07/08/22  9:06 PM   Specimen: Urine, Clean Catch  Result Value Ref Range Status   Specimen Description   Final    URINE, CLEAN CATCH  Performed at [facility], [address] [place], [place] [address]    Special Requests   Final    NONE Performed at [facility], [address] [place], [place] [address]    Culture >=100,000 COLONIES/mL KLEBSIELLA PNEUMONIAE (A)  Final   Report Status  07/11/2022 FINAL  Final   Organism ID, Bacteria KLEBSIELLA PNEUMONIAE (A)  Final      Susceptibility   Klebsiella pneumoniae - MIC*    AMPICILLIN >=32 RESISTANT Resistant     CEFAZOLIN <=4 SENSITIVE Sensitive     CEFEPIME <=0.12 SENSITIVE Sensitive     CEFTRIAXONE <=0.25 SENSITIVE Sensitive     CIPROFLOXACIN <=0.25 SENSITIVE Sensitive     GENTAMICIN <=1 SENSITIVE Sensitive     IMIPENEM <=0.25 SENSITIVE Sensitive     NITROFURANTOIN 128 RESISTANT Resistant     TRIMETH/SULFA <=20 SENSITIVE Sensitive     AMPICILLIN/SULBACTAM 4 SENSITIVE Sensitive     PIP/TAZO <=4 SENSITIVE Sensitive     * >=100,000 COLONIES/mL KLEBSIELLA PNEUMONIAE  Blood Culture (routine x 2)     Status: None (Preliminary result)   Collection Time: 07/09/22  1:51 AM   Specimen: BLOOD  Result Value Ref Range Status   Specimen Description BLOOD BLOOD RIGHT ARM  Final   Special Requests   Final    BOTTLES DRAWN AEROBIC AND ANAEROBIC Blood Culture results may not be optimal due to an inadequate volume of blood received in culture bottles   Culture   Final    NO GROWTH 2 DAYS Performed at [facility], [address], [place], [place] [address]    Report Status PENDING  Incomplete  Blood Culture (routine x 2)     Status: None (Preliminary result)   Collection Time: 07/09/22  1:52 AM   Specimen: BLOOD  Result Value Ref Range Status   Specimen Description BLOOD BLOOD RIGHT FOREARM  Final   Special Requests   Final    BOTTLES DRAWN AEROBIC AND ANAEROBIC Blood Culture results may not be optimal due to an inadequate volume of blood received in culture bottles   Culture   Final    NO GROWTH 2 DAYS Performed at [facility], [address], [place], [place] [address]    Report Status PENDING  Incomplete     Time coordinating discharge: Over 30 minutes  SIGNED:   Sidney Ace, MD  [facility] 07/11/2022, 12:58 PM Pager   If 7PM-7AM, please contact night-coverage

## 2022-07-11 NOTE — Progress Notes (Signed)
PHARMACIST - PHYSICIAN COMMUNICATION  CONCERNING:  Enoxaparin (Lovenox) for DVT Prophylaxis    RECOMMENDATION: Patient was prescribed enoxaprin 30mg  q24 hours for VTE prophylaxis.   Filed Weights   07/08/22 2102  Weight: 59 kg (130 lb)    Body mass index is 23.03 kg/m.  Estimated Creatinine Clearance: 44.4 mL/min (by C-G formula based on SCr of 0.99 mg/dL).   Based on [place] patient is candidate for 40mg  every 24 hours based on CrCl >68ml/min  DESCRIPTION: Pharmacy has adjusted enoxaparin dose per [facility] policy.  Patient is now receiving enoxaparin 40 mg every 24 hours    Lorna Dibble, PharmD Clinical Pharmacist  07/11/2022 5:02 AM

## 2022-07-11 NOTE — Progress Notes (Addendum)
Patient and husband was given verbal and written discharge orders, acknowledge understanding and states she will comply, Patient is waiting on antibiotics to finish. Walker was delivered, patient taken to car by wheelchair, no distress when leaving the floor.

## 2022-07-11 NOTE — Plan of Care (Signed)

## 2022-07-11 NOTE — Plan of Care (Signed)
  Problem: Coping: Goal: Ability to adjust to condition or change in health will improve Outcome: Progressing   Problem: Health Behavior/Discharge Planning: Goal: Ability to identify and utilize available resources and services will improve Outcome: Progressing   Problem: Metabolic: Goal: Ability to maintain appropriate glucose levels will improve Outcome: Progressing   Problem: Nutritional: Goal: Maintenance of adequate nutrition will improve Outcome: Progressing   Problem: Nutritional: Goal: Progress toward achieving an optimal weight will improve Outcome: Progressing   Problem: Safety: Goal: Ability to remain free from injury will improve Outcome: Progressing   Problem: Skin Integrity: Goal: Risk for impaired skin integrity will decrease Outcome: Progressing

## 2022-07-12 ENCOUNTER — Telehealth: Payer: Self-pay | Admitting: *Deleted

## 2022-07-12 NOTE — Patient Outreach (Addendum)
  Care Coordination TOC Note Transition Care Management Follow-up Telephone Call Date of discharge and from where: [facility] 07/11/2022 How have you been since you were released from the hospital? My legs ar weak  Any questions or concerns? Yes Patient has not heard from the [facility] agency and does not know the name.  Items Reviewed: Did the pt receive and understand the discharge instructions provided? Yes  Medications obtained and verified? Yes  Other? No  Any new allergies since your discharge? No  Dietary orders reviewed? Yes Low sodium Heart Healthy Diet Do you have support at home? Yes   Home Care and Equipment/Supplies: Were home health services ordered? yes If so, what is the name of the agency? [facility] Has the agency set up a time to come to the patient's home? no Were any new equipment or medical supplies ordered?  No What is the name of the medical supply agency? N/a  Were you able to get the supplies/equipment? not applicable Do you have any questions related to the use of the equipment or supplies? No  Functional Questionnaire: (I = Independent and D = Dependent) ADLs: I  Bathing/Dressing- I  Meal Prep- I  Eating- I  Maintaining continence- I  Transferring/Ambulation- I  Managing Meds- I  Follow up appointments reviewed:  PCP Hospital f/u appt confirmed? Yes  Scheduled to see Dr Danise Mina [phone] 9:00. [facility] f/u appt confirmed? Yes  Scheduled to see Dr Leighton Ruff  on [phone] 9:00. Are transportation arrangements needed? No  If their condition worsens, is the pt aware to call PCP or go to the Emergency Dept.? Yes Was the patient provided with contact information for the PCP's office or ED? Yes Was to pt encouraged to call back with questions or concerns? Yes  SDOH assessments and interventions completed:   Yes  Care Coordination Interventions Activated:  Yes   Care Coordination Interventions: RN contacted [facility] Social Worker From time of discharge  to find [facility] agency number and name [place] [facility] will be the agency . RN attempted x 1 pt home and cell phone  no answer    Encounter Outcome:  Pt. Visit Completed    [facility] [phone]

## 2022-07-13 DIAGNOSIS — H524 Presbyopia: Secondary | ICD-10-CM | POA: Diagnosis not present

## 2022-07-14 DIAGNOSIS — Z940 Kidney transplant status: Secondary | ICD-10-CM | POA: Diagnosis not present

## 2022-07-14 LAB — CULTURE, BLOOD (ROUTINE X 2)
Culture: NO GROWTH
Culture: NO GROWTH

## 2022-07-17 ENCOUNTER — Ambulatory Visit (INDEPENDENT_AMBULATORY_CARE_PROVIDER_SITE_OTHER): Payer: Medicare Other | Admitting: Family Medicine

## 2022-07-17 ENCOUNTER — Encounter: Payer: Self-pay | Admitting: Family Medicine

## 2022-07-17 VITALS — BP 130/70 | HR 66 | Temp 97.9°F | Ht 63.0 in | Wt 134.4 lb

## 2022-07-17 DIAGNOSIS — D631 Anemia in chronic kidney disease: Secondary | ICD-10-CM | POA: Diagnosis not present

## 2022-07-17 DIAGNOSIS — Z7982 Long term (current) use of aspirin: Secondary | ICD-10-CM | POA: Diagnosis not present

## 2022-07-17 DIAGNOSIS — R413 Other amnesia: Secondary | ICD-10-CM

## 2022-07-17 DIAGNOSIS — M818 Other osteoporosis without current pathological fracture: Secondary | ICD-10-CM

## 2022-07-17 DIAGNOSIS — K449 Diaphragmatic hernia without obstruction or gangrene: Secondary | ICD-10-CM | POA: Diagnosis not present

## 2022-07-17 DIAGNOSIS — K4090 Unilateral inguinal hernia, without obstruction or gangrene, not specified as recurrent: Secondary | ICD-10-CM | POA: Diagnosis not present

## 2022-07-17 DIAGNOSIS — E876 Hypokalemia: Secondary | ICD-10-CM | POA: Diagnosis not present

## 2022-07-17 DIAGNOSIS — N1831 Chronic kidney disease, stage 3a: Secondary | ICD-10-CM

## 2022-07-17 DIAGNOSIS — D849 Immunodeficiency, unspecified: Secondary | ICD-10-CM

## 2022-07-17 DIAGNOSIS — R152 Fecal urgency: Secondary | ICD-10-CM

## 2022-07-17 DIAGNOSIS — K5730 Diverticulosis of large intestine without perforation or abscess without bleeding: Secondary | ICD-10-CM | POA: Diagnosis not present

## 2022-07-17 DIAGNOSIS — M4316 Spondylolisthesis, lumbar region: Secondary | ICD-10-CM | POA: Diagnosis not present

## 2022-07-17 DIAGNOSIS — E1169 Type 2 diabetes mellitus with other specified complication: Secondary | ICD-10-CM | POA: Diagnosis not present

## 2022-07-17 DIAGNOSIS — Z940 Kidney transplant status: Secondary | ICD-10-CM

## 2022-07-17 DIAGNOSIS — N179 Acute kidney failure, unspecified: Secondary | ICD-10-CM | POA: Diagnosis not present

## 2022-07-17 DIAGNOSIS — Z87440 Personal history of urinary (tract) infections: Secondary | ICD-10-CM | POA: Diagnosis not present

## 2022-07-17 DIAGNOSIS — N3000 Acute cystitis without hematuria: Secondary | ICD-10-CM | POA: Diagnosis not present

## 2022-07-17 DIAGNOSIS — Z9682 Presence of neurostimulator: Secondary | ICD-10-CM | POA: Diagnosis not present

## 2022-07-17 DIAGNOSIS — Z9181 History of falling: Secondary | ICD-10-CM | POA: Diagnosis not present

## 2022-07-17 DIAGNOSIS — Z7983 Long term (current) use of bisphosphonates: Secondary | ICD-10-CM | POA: Diagnosis not present

## 2022-07-17 DIAGNOSIS — K219 Gastro-esophageal reflux disease without esophagitis: Secondary | ICD-10-CM | POA: Diagnosis not present

## 2022-07-17 DIAGNOSIS — E785 Hyperlipidemia, unspecified: Secondary | ICD-10-CM | POA: Diagnosis not present

## 2022-07-17 DIAGNOSIS — I2510 Atherosclerotic heart disease of native coronary artery without angina pectoris: Secondary | ICD-10-CM | POA: Diagnosis not present

## 2022-07-17 DIAGNOSIS — M48061 Spinal stenosis, lumbar region without neurogenic claudication: Secondary | ICD-10-CM | POA: Diagnosis not present

## 2022-07-17 DIAGNOSIS — I129 Hypertensive chronic kidney disease with stage 1 through stage 4 chronic kidney disease, or unspecified chronic kidney disease: Secondary | ICD-10-CM | POA: Diagnosis not present

## 2022-07-17 DIAGNOSIS — T8619 Other complication of kidney transplant: Secondary | ICD-10-CM | POA: Diagnosis not present

## 2022-07-17 DIAGNOSIS — E1122 Type 2 diabetes mellitus with diabetic chronic kidney disease: Secondary | ICD-10-CM | POA: Diagnosis not present

## 2022-07-17 DIAGNOSIS — R159 Full incontinence of feces: Secondary | ICD-10-CM

## 2022-07-17 DIAGNOSIS — M2170 Unequal limb length (acquired), unspecified site: Secondary | ICD-10-CM

## 2022-07-17 DIAGNOSIS — K429 Umbilical hernia without obstruction or gangrene: Secondary | ICD-10-CM | POA: Diagnosis not present

## 2022-07-17 DIAGNOSIS — M47816 Spondylosis without myelopathy or radiculopathy, lumbar region: Secondary | ICD-10-CM | POA: Diagnosis not present

## 2022-07-17 LAB — POCT GLYCOSYLATED HEMOGLOBIN (HGB A1C): Hemoglobin A1C: 7 % — AB (ref 4.0–5.6)

## 2022-07-17 NOTE — Patient Instructions (Addendum)
We will schedule diabetic eye exam at our office - we will call you for this.  Call [facility] Dr Alvira Philips office [phone] to schedule a visit, let me know if any difficulty making an appointment.  Good to see you today  Return in 3-4 months for follow up visit.

## 2022-07-17 NOTE — Progress Notes (Unsigned)
Patient ID: Kristen Kirk, female    DOB: [birth year], 71 y.o.   MRN: [id number]  This visit was conducted in person.  BP 130/70   Pulse 66   Temp 97.9 F (36.6 C) (Temporal)   Ht '5\' 3"'  (1.6 m)   Wt 134 lb 6 oz (61 kg)   LMP  (LMP Unknown)   SpO2 99%   BMI 23.80 kg/m    CC: 3 mo DM f/u visit, hospital f/u Subjective:   HPI: Kristen Kirk is a 71 y.o. female presenting on 07/17/2022 for Diabetes (Here 3 mo f/u.)   Kidney transplant 04/2017 on prograf and mycophenolate. Goal BP <130/80.   Fecal incontinence s/p sacral nerve stimulator placement by Dr Marcello Moores 1/32/4401 complicated by acute on chronic renal failure thought due to dehydration. Treated with 3d rocephin IV course for urosepsis due to Klebsiella UTI. Notes improvement in fecal incontinence since surgery.   Hospital records reviewed. Med rec performed.   Due for final pap smear in h/o ASCUS with neg HRHPV- deferred after recent hospitalization.   Fosamax was discontinued. This was started by renal - I've added it back to her medication list.   DM - continues controlled through diet. Uses one touch ultra glucometer. Eye exam - DUE. She regularly sees glaucoma specialist. Foot exam - DUE.  Lab Results  Component Value Date   HGBA1C 7.0 (A) 07/17/2022    Diabetic Foot Exam - Simple   Simple Foot Form Diabetic Foot exam was performed with the following findings: Yes 07/17/2022  9:37 AM  Visual Inspection No deformities, no ulcerations, no other skin breakdown bilaterally: Yes Sensation Testing See comments: Yes Pulse Check Posterior Tibialis and Dorsalis pulse intact bilaterally: Yes Comments Diminished sensation to L sole  2+ DP bilaterally    Since home she's been drinking plenty of water, limiting sweets and carbs.   Notes more difficulty with memory - will do formal memory assessment next visit.   Home health set up - they're coming today.  Other follow up appointments scheduled: gen surgery Dr  Marcello Moores 07/31/2022. Urology 07/19/2022.  ______________________________________________________________________ Hospital admission: 07/09/2022 Hospital discharge: 07/11/2022 TCM f/u phone call: 07/12/2022 performed   Recommendations for Outpatient Follow-up:  Follow up with PCP in 1-2 weeks Follow-up with transplant nephrology at [facility]   Discharge Diagnoses:  Principal Problem:   Acute renal failure superimposed on stage 3a chronic kidney disease ([place]) Active Problems:   History of renal transplant   Anemia in chronic kidney disease   Essential hypertension   HLD (hyperlipidemia)   Hypokalemia   Incontinence of feces   Sepsis ([place])   UTI (urinary tract infection)   Type II diabetes mellitus with renal manifestations ([place])   Fall at home, initial encounter  Home Health: No Equipment/Devices: None   Discharge Condition: Stable CODE STATUS: Full Diet recommendation: Regular     Relevant past medical, surgical, family and social history reviewed and updated as indicated. Interim medical history since our last visit reviewed. Allergies and medications reviewed and updated. Outpatient Medications Prior to Visit  Medication Sig Dispense Refill   alendronate (FOSAMAX) 70 MG tablet Take 1 tablet (70 mg total) by mouth every 7 (seven) days. Take with a full glass of water on an empty stomach.     amLODipine (NORVASC) 2.5 MG tablet Take 2.5 mg by mouth daily.     aspirin 81 MG tablet Take 81 mg by mouth daily.     Blood Glucose Monitoring Suppl (ONE TOUCH  ULTRA SYSTEM KIT) W/DEVICE KIT 1 kit by Does not apply route once. 1 each 0   carvedilol (COREG) 25 MG tablet Take 12.5 mg by mouth 2 (two) times daily with a meal.     Cholecalciferol (VITAMIN D3) 1000 units CAPS Take 2 capsules (2,000 Units total) by mouth daily. 60 capsule    docusate sodium (COLACE) 100 MG capsule Take 100 mg by mouth 2 (two) times daily as needed for mild constipation or moderate constipation.  0   glucose blood  test strip Check sugars once daily 100 each 11   hydrochlorothiazide (HYDRODIURIL) 12.5 MG tablet Take 1 tablet (12.5 mg total) by mouth daily. 90 tablet 3   ketorolac (ACULAR) 0.5 % ophthalmic solution Place 1 drop into the left eye 4 (four) times daily.     latanoprost (XALATAN) 0.005 % ophthalmic solution Place 1 drop into the right eye at bedtime.     Melatonin 3 MG CAPS Take 1-2 capsules (3-6 mg total) by mouth at bedtime as needed (sleep).  0   mirabegron ER (MYRBETRIQ) 25 MG TB24 tablet Take 25 mg by mouth daily.     Multiple Vitamin (MULTIVITAMIN) tablet Take 1 tablet by mouth daily.     mycophenolate (CELLCEPT) 500 MG tablet Take 500 mg by mouth 2 (two) times daily.     pantoprazole (PROTONIX) 20 MG tablet TAKE 1 TABLET BY MOUTH EVERY DAY 90 tablet 3   pravastatin (PRAVACHOL) 80 MG tablet TAKE 1 TABLET BY MOUTH  EVERY EVENING (Patient taking differently: Take 80 mg by mouth daily.) 90 tablet 0   prednisoLONE acetate (PRED FORTE) 1 % ophthalmic suspension Place 1 drop into the left eye every 2 (two) hours while awake.     tacrolimus (PROGRAF) 0.5 MG capsule Take 0.5-1 mg by mouth See admin instructions. Take 2 capsules (36m) by mouth every morning and take 1 capsule (0.556m by mouth every night     No facility-administered medications prior to visit.     Per HPI unless specifically indicated in ROS section below Review of Systems  Objective:  BP 130/70   Pulse 66   Temp 97.9 F (36.6 C) (Temporal)   Ht '5\' 3"'  (1.6 m)   Wt 134 lb 6 oz (61 kg)   LMP  (LMP Unknown)   SpO2 99%   BMI 23.80 kg/m   Wt Readings from Last 3 Encounters:  07/17/22 134 lb 6 oz (61 kg)  07/08/22 130 lb (59 kg)  06/29/22 134 lb 11.2 oz (61.1 kg)      Physical Exam Vitals and nursing note reviewed.  Constitutional:      Appearance: Normal appearance. She is not ill-appearing.  Eyes:     Extraocular Movements: Extraocular movements intact.     Conjunctiva/sclera: Conjunctivae normal.     Pupils:  Pupils are equal, round, and reactive to light.  Cardiovascular:     Rate and Rhythm: Normal rate and regular rhythm.     Pulses: Normal pulses.     Heart sounds: Normal heart sounds. No murmur heard. Pulmonary:     Effort: Pulmonary effort is normal. No respiratory distress.     Breath sounds: Normal breath sounds. No wheezing, rhonchi or rales.  Musculoskeletal:     Right lower leg: No edema.     Left lower leg: No edema.  Skin:    General: Skin is warm and dry.     Findings: No rash.  Neurological:     Mental Status: She is alert.  Psychiatric:        Mood and Affect: Mood normal.        Behavior: Behavior normal.       Results for orders placed or performed in visit on 07/17/22  POCT glycosylated hemoglobin (Hb A1C)  Result Value Ref Range   Hemoglobin A1C 7.0 (A) 4.0 - 5.6 %   HbA1c POC (<> result, manual entry)     HbA1c, POC (prediabetic range)     HbA1c, POC (controlled diabetic range)      Assessment & Plan:   Problem List Items Addressed This Visit       Acute   Incontinence of feces    S/p sacral nerve stimulator placement 06/2022 Marcello Moores). Significant improvement noted in fecal incontinence since procedure.         Other   Immunosuppressed status ([place]) (Chronic)   Type 2 diabetes mellitus with other specified complication ([place])    Update A1c today - 7%. Diet controlled diabetes. Will ask to have her scheduled at our office for diabetic retinopathy screen. She regularly sees glaucoma doctor. Foot exam today.       Relevant Orders   POCT glycosylated hemoglobin (Hb A1C) (Completed)   History of renal transplant   Memory impairment    Will return for formal memory assessment.       Osteoporosis    She continues fosamax.       Relevant Medications   alendronate (FOSAMAX) 70 MG tablet   Acquired leg length discrepancy    She has not received notice of ortho appt. # provided to call and schedule.       Acute renal failure superimposed on stage 3a  chronic kidney disease ([place])    Improving on discharge.       UTI (urinary tract infection) - Primary    Recent hospitalization for urosepsis due to Klebsiella UTI s/p treatment with 3d IV rocephin course with improvement, after recent sacral nerve stimulator placement. Has recovered well from this.         No orders of the defined types were placed in this encounter.  Orders Placed This Encounter  Procedures   POCT glycosylated hemoglobin (Hb A1C)     Patient Instructions  We will schedule diabetic eye exam at our office - we will call you for this.  Call [facility] Dr Alvira Philips office [phone] to schedule a visit, let me know if any difficulty making an appointment.  Good to see you today  Return in 3-4 months for follow up visit.   Follow up plan: Return in about 3 months (around 10/17/2022), or if symptoms worsen or fail to improve, for follow up visit.  Ria Bush, MD

## 2022-07-18 NOTE — Assessment & Plan Note (Signed)
Recent hospitalization for urosepsis due to Klebsiella UTI s/p treatment with 3d IV rocephin course with improvement, after recent sacral nerve stimulator placement. Has recovered well from this.

## 2022-07-18 NOTE — Assessment & Plan Note (Signed)
Will return for formal memory assessment.

## 2022-07-18 NOTE — Assessment & Plan Note (Signed)
Update A1c today - 7%. Diet controlled diabetes. Will ask to have her scheduled at our office for diabetic retinopathy screen. She regularly sees glaucoma doctor. Foot exam today.

## 2022-07-18 NOTE — Assessment & Plan Note (Signed)
Improving on discharge.

## 2022-07-18 NOTE — Assessment & Plan Note (Signed)
She continues fosamax.

## 2022-07-18 NOTE — Assessment & Plan Note (Signed)
S/p sacral nerve stimulator placement 06/2022 Kristen Kirk). Significant improvement noted in fecal incontinence since procedure.

## 2022-07-18 NOTE — Assessment & Plan Note (Signed)
She has not received notice of ortho appt. # provided to call and schedule.

## 2022-07-19 DIAGNOSIS — R350 Frequency of micturition: Secondary | ICD-10-CM | POA: Diagnosis not present

## 2022-07-19 DIAGNOSIS — N3946 Mixed incontinence: Secondary | ICD-10-CM | POA: Diagnosis not present

## 2022-07-26 ENCOUNTER — Ambulatory Visit
Admission: RE | Admit: 2022-07-26 | Discharge: 2022-07-26 | Disposition: A | Discharge status: Home / Self Care | Payer: Medicare Other | Source: Ambulatory Visit | Attending: Family Medicine | Admitting: Family Medicine

## 2022-07-26 DIAGNOSIS — Z780 Asymptomatic menopausal state: Secondary | ICD-10-CM | POA: Diagnosis not present

## 2022-07-26 DIAGNOSIS — K4090 Unilateral inguinal hernia, without obstruction or gangrene, not specified as recurrent: Secondary | ICD-10-CM

## 2022-07-26 DIAGNOSIS — K429 Umbilical hernia without obstruction or gangrene: Secondary | ICD-10-CM

## 2022-07-26 DIAGNOSIS — E785 Hyperlipidemia, unspecified: Secondary | ICD-10-CM

## 2022-07-26 DIAGNOSIS — Z9682 Presence of neurostimulator: Secondary | ICD-10-CM

## 2022-07-26 DIAGNOSIS — N1831 Chronic kidney disease, stage 3a: Secondary | ICD-10-CM

## 2022-07-26 DIAGNOSIS — M47816 Spondylosis without myelopathy or radiculopathy, lumbar region: Secondary | ICD-10-CM

## 2022-07-26 DIAGNOSIS — Z7982 Long term (current) use of aspirin: Secondary | ICD-10-CM

## 2022-07-26 DIAGNOSIS — K219 Gastro-esophageal reflux disease without esophagitis: Secondary | ICD-10-CM

## 2022-07-26 DIAGNOSIS — I129 Hypertensive chronic kidney disease with stage 1 through stage 4 chronic kidney disease, or unspecified chronic kidney disease: Secondary | ICD-10-CM | POA: Diagnosis not present

## 2022-07-26 DIAGNOSIS — E1122 Type 2 diabetes mellitus with diabetic chronic kidney disease: Secondary | ICD-10-CM | POA: Diagnosis not present

## 2022-07-26 DIAGNOSIS — T8619 Other complication of kidney transplant: Secondary | ICD-10-CM | POA: Diagnosis not present

## 2022-07-26 DIAGNOSIS — Z9181 History of falling: Secondary | ICD-10-CM

## 2022-07-26 DIAGNOSIS — K5730 Diverticulosis of large intestine without perforation or abscess without bleeding: Secondary | ICD-10-CM

## 2022-07-26 DIAGNOSIS — K449 Diaphragmatic hernia without obstruction or gangrene: Secondary | ICD-10-CM

## 2022-07-26 DIAGNOSIS — D631 Anemia in chronic kidney disease: Secondary | ICD-10-CM

## 2022-07-26 DIAGNOSIS — E876 Hypokalemia: Secondary | ICD-10-CM

## 2022-07-26 DIAGNOSIS — I2510 Atherosclerotic heart disease of native coronary artery without angina pectoris: Secondary | ICD-10-CM

## 2022-07-26 DIAGNOSIS — M810 Age-related osteoporosis without current pathological fracture: Secondary | ICD-10-CM | POA: Diagnosis not present

## 2022-07-26 DIAGNOSIS — N179 Acute kidney failure, unspecified: Secondary | ICD-10-CM | POA: Diagnosis not present

## 2022-07-26 DIAGNOSIS — Z87440 Personal history of urinary (tract) infections: Secondary | ICD-10-CM

## 2022-07-26 DIAGNOSIS — M4316 Spondylolisthesis, lumbar region: Secondary | ICD-10-CM

## 2022-07-26 DIAGNOSIS — Z7983 Long term (current) use of bisphosphonates: Secondary | ICD-10-CM

## 2022-07-26 DIAGNOSIS — M48061 Spinal stenosis, lumbar region without neurogenic claudication: Secondary | ICD-10-CM

## 2022-07-31 DIAGNOSIS — H35352 Cystoid macular degeneration, left eye: Secondary | ICD-10-CM | POA: Diagnosis not present

## 2022-07-31 DIAGNOSIS — H02401 Unspecified ptosis of right eyelid: Secondary | ICD-10-CM | POA: Diagnosis not present

## 2022-07-31 DIAGNOSIS — H2513 Age-related nuclear cataract, bilateral: Secondary | ICD-10-CM | POA: Diagnosis not present

## 2022-07-31 DIAGNOSIS — H401133 Primary open-angle glaucoma, bilateral, severe stage: Secondary | ICD-10-CM | POA: Diagnosis not present

## 2022-08-07 ENCOUNTER — Telehealth: Payer: Self-pay | Admitting: Family Medicine

## 2022-08-07 NOTE — Telephone Encounter (Signed)
Spoke to patient by telephone and was advised that she was given Tizanidine back 04/2021. Patient stated that she does not have any refills on the medication Patient stated that she had a lidocaine patch and put it on her shoulder and it calmed the pain just a little bit. Patient stated that she fell Wednesday or Thursday last week and the pain started shortly after the fall. Patient stated that she has had the shoulder pain for about 3 days  now and at times it is a pain level 9-10. Patient scheduled an appointment tomorrow 08/08/22 with Romilda Garret NP at 9:00 Patient was advised to rest her shoulder and use some ice off and on throughout the day. . Patient was given ER precautions and she verbalized understanding.

## 2022-08-07 NOTE — Telephone Encounter (Signed)
Patient called in and stated that she was seen for shoulder by Dr. Darnell Level but she is still having trouble with it. She was wondering if a muscle relaxer or something could be called in for her without coming in. Please advise. Thank you!

## 2022-08-07 NOTE — Telephone Encounter (Signed)
Noted. Will evaluate in office  

## 2022-08-08 ENCOUNTER — Ambulatory Visit (INDEPENDENT_AMBULATORY_CARE_PROVIDER_SITE_OTHER)
Admission: RE | Admit: 2022-08-08 | Discharge: 2022-08-08 | Disposition: A | Discharge status: Home / Self Care | Payer: Medicare Other | Source: Ambulatory Visit | Attending: Nurse Practitioner | Admitting: Nurse Practitioner

## 2022-08-08 ENCOUNTER — Ambulatory Visit (INDEPENDENT_AMBULATORY_CARE_PROVIDER_SITE_OTHER): Payer: Medicare Other | Admitting: Nurse Practitioner

## 2022-08-08 VITALS — BP 132/64 | HR 55 | Temp 96.9°F | Resp 14 | Ht 63.0 in | Wt 133.4 lb

## 2022-08-08 DIAGNOSIS — W19XXXA Unspecified fall, initial encounter: Secondary | ICD-10-CM

## 2022-08-08 DIAGNOSIS — R195 Other fecal abnormalities: Secondary | ICD-10-CM | POA: Diagnosis not present

## 2022-08-08 DIAGNOSIS — M25511 Pain in right shoulder: Secondary | ICD-10-CM

## 2022-08-08 DIAGNOSIS — M791 Myalgia, unspecified site: Secondary | ICD-10-CM | POA: Diagnosis not present

## 2022-08-08 MED ORDER — TIZANIDINE HCL 2 MG PO TABS: 2.0000 mg | ORAL_TABLET | Freq: Two times a day (BID) | ORAL | 0 refills | Status: DC | PRN

## 2022-08-08 NOTE — Progress Notes (Signed)
Acute Office Visit  Subjective:     Patient ID: Kristen Kirk, female    DOB: 1950-12-21, 71 y.o.   MRN: [id number]  Chief Complaint  Patient presents with   Fall    On 08/02/22 at a ball game, and fell forward stretching both arms out. Right shoulder pain is present. Has taking Tylenol, lidocaine patch     Patient is in today for a fall  She does have a history of DM2, osteoporosis, immunocompromised due to medical therapy. S/p kidney transplant.   Pateitn states that she fell on 08/02/2022. Stwtes that she was at a ball game. States that there was a piece of pavement that was higher than the others and tripped. States that she fell forward pavement and dirt. Did not hit her head, denies loc. States that her riht shoulder started hurting and then started going to her neck. States that it was huritn gso bad. States that it is a little bit better today Has used tylenol and lidocaine patches. States that it was helpful.  States that she describes the discomfort. Muscle pain that was all the time. States that when she movement worse.   As I was walking patient to x-ray she mentioned that she had been having dark stools.  Patient denies lightheadedness, dizziness, headaches.  States she had had some diarrhea and was taking Pepto-Bismol.  Patient's last colonoscopy was in 03/25/2019 by Dr. Silverio Decamp.  Patient had no polyps with a recommended follow-up in 10 years did note diverticulosis in the sigmoid and descending colon and nonbleeding internal hemorrhoids.  Patient also is not on iron supplements.    Review of Systems  Constitutional:  Negative for chills and fever.  Musculoskeletal:  Positive for joint pain.  Neurological:  Negative for dizziness, tingling, weakness and headaches.        Objective:    BP 132/64   Pulse (!) 55   Temp (!) 96.9 F (36.1 C) (Temporal)   Resp 14   Ht 5\' 3"  (1.6 m)   Wt 133 lb 6 oz (60.5 kg)   LMP  (LMP Unknown)   SpO2 99%   BMI 23.63 kg/m     Physical Exam Vitals and nursing note reviewed.  Constitutional:      Appearance: Normal appearance.  Cardiovascular:     Rate and Rhythm: Normal rate and regular rhythm.     Heart sounds: Normal heart sounds.  Pulmonary:     Effort: Pulmonary effort is normal.     Breath sounds: Normal breath sounds.  Musculoskeletal:        General: Tenderness present.       Arms:     Comments: Right shoulder has full ROM with out tenderness Empty can test was negative Hawkins Test was negative  TTP to area circled   Neurological:     General: No focal deficit present.     Mental Status: She is alert.     Deep Tendon Reflexes:     Reflex Scores:      Bicep reflexes are 2+ on the right side and 2+ on the left side.    Comments: Bilateral upper extremity strength 5/5     No results found for any visits on 08/08/22.      Assessment & Plan:   Problem List Items Addressed This Visit       Other   Right shoulder pain    Status post fall.  Given patient age mechanism of injury and history of osteoporosis  obtain shoulder x-ray, pending result      Relevant Orders   DG Shoulder Right   Fall - Primary    Mechanical in nature.  Patient's foot got caught on uneven concrete.  Denies LOC denies hitting her head.      Relevant Orders   DG Shoulder Right   Muscle pain    Muscular pain on exam.  Will put patient on tizanidine 2 mg nightly as needed.  Sedation precautions reviewed      Relevant Medications   tiZANidine (ZANAFLEX) 2 MG tablet   Dark stools    As she has been dismissed mention dark stools.  Patient has been using Pepto-Bismol likely the culprit.  We will do I fob pending result.      Relevant Orders   Fecal occult blood, imunochemical    Meds ordered this encounter  Medications   tiZANidine (ZANAFLEX) 2 MG tablet    Sig: Take 1 tablet (2 mg total) by mouth 3 times/day as needed-between meals & bedtime for muscle spasms.    Dispense:  10 tablet    Refill:  0     Order Specific Question:   Supervising Provider    Answer:   TOWER, MARNE A [[id number]    Return if symptoms worsen or fail to improve.  Romilda Garret, NP

## 2022-08-08 NOTE — Assessment & Plan Note (Signed)
Muscular pain on exam.  Will put patient on tizanidine 2 mg nightly as needed.  Sedation precautions reviewed

## 2022-08-08 NOTE — Assessment & Plan Note (Signed)
Mechanical in nature.  Patient's foot got caught on uneven concrete.  Denies LOC denies hitting her head.

## 2022-08-08 NOTE — Assessment & Plan Note (Signed)
As she has been dismissed mention dark stools.  Patient has been using Pepto-Bismol likely the culprit.  We will do I fob pending result.

## 2022-08-08 NOTE — Assessment & Plan Note (Signed)
Status post fall.  Given patient age mechanism of injury and history of osteoporosis obtain shoulder x-ray, pending result

## 2022-08-08 NOTE — Patient Instructions (Signed)
Nice to see you today I will be in touch once I have the xray results I have sent in a muscle relaxer. This can make you sleepy so use caution  Follow up if no improvement

## 2022-08-10 ENCOUNTER — Telehealth: Payer: Self-pay | Admitting: *Deleted

## 2022-08-10 ENCOUNTER — Other Ambulatory Visit: Payer: Self-pay | Admitting: Nurse Practitioner

## 2022-08-10 DIAGNOSIS — R918 Other nonspecific abnormal finding of lung field: Secondary | ICD-10-CM

## 2022-08-10 NOTE — Telephone Encounter (Signed)
Ruby with [facility] called to advise Catalina Antigua, NP of xray results. Please see finalized report

## 2022-08-10 NOTE — Telephone Encounter (Signed)
Noted. Will address via the results note tab

## 2022-08-11 ENCOUNTER — Ambulatory Visit: Payer: Medicare Other

## 2022-08-14 ENCOUNTER — Ambulatory Visit (INDEPENDENT_AMBULATORY_CARE_PROVIDER_SITE_OTHER)
Admission: RE | Admit: 2022-08-14 | Discharge: 2022-08-14 | Disposition: A | Discharge status: Home / Self Care | Payer: Medicare Other | Source: Ambulatory Visit | Attending: Nurse Practitioner | Admitting: Nurse Practitioner

## 2022-08-14 DIAGNOSIS — R918 Other nonspecific abnormal finding of lung field: Secondary | ICD-10-CM | POA: Diagnosis not present

## 2022-08-14 DIAGNOSIS — Z0389 Encounter for observation for other suspected diseases and conditions ruled out: Secondary | ICD-10-CM | POA: Diagnosis not present

## 2022-08-16 ENCOUNTER — Encounter: Payer: Self-pay | Admitting: Podiatry

## 2022-08-16 ENCOUNTER — Ambulatory Visit: Payer: Medicare Other | Admitting: Podiatry

## 2022-08-16 DIAGNOSIS — I129 Hypertensive chronic kidney disease with stage 1 through stage 4 chronic kidney disease, or unspecified chronic kidney disease: Secondary | ICD-10-CM | POA: Diagnosis not present

## 2022-08-16 DIAGNOSIS — L600 Ingrowing nail: Secondary | ICD-10-CM | POA: Diagnosis not present

## 2022-08-16 DIAGNOSIS — D631 Anemia in chronic kidney disease: Secondary | ICD-10-CM | POA: Diagnosis not present

## 2022-08-16 DIAGNOSIS — K5730 Diverticulosis of large intestine without perforation or abscess without bleeding: Secondary | ICD-10-CM | POA: Diagnosis not present

## 2022-08-16 DIAGNOSIS — T8619 Other complication of kidney transplant: Secondary | ICD-10-CM | POA: Diagnosis not present

## 2022-08-16 DIAGNOSIS — E785 Hyperlipidemia, unspecified: Secondary | ICD-10-CM | POA: Diagnosis not present

## 2022-08-16 DIAGNOSIS — N179 Acute kidney failure, unspecified: Secondary | ICD-10-CM | POA: Diagnosis not present

## 2022-08-16 DIAGNOSIS — N1831 Chronic kidney disease, stage 3a: Secondary | ICD-10-CM | POA: Diagnosis not present

## 2022-08-16 DIAGNOSIS — K4090 Unilateral inguinal hernia, without obstruction or gangrene, not specified as recurrent: Secondary | ICD-10-CM | POA: Diagnosis not present

## 2022-08-16 DIAGNOSIS — K429 Umbilical hernia without obstruction or gangrene: Secondary | ICD-10-CM | POA: Diagnosis not present

## 2022-08-16 DIAGNOSIS — I2510 Atherosclerotic heart disease of native coronary artery without angina pectoris: Secondary | ICD-10-CM | POA: Diagnosis not present

## 2022-08-16 DIAGNOSIS — K449 Diaphragmatic hernia without obstruction or gangrene: Secondary | ICD-10-CM | POA: Diagnosis not present

## 2022-08-16 DIAGNOSIS — M47816 Spondylosis without myelopathy or radiculopathy, lumbar region: Secondary | ICD-10-CM | POA: Diagnosis not present

## 2022-08-16 DIAGNOSIS — K219 Gastro-esophageal reflux disease without esophagitis: Secondary | ICD-10-CM | POA: Diagnosis not present

## 2022-08-16 DIAGNOSIS — M48061 Spinal stenosis, lumbar region without neurogenic claudication: Secondary | ICD-10-CM | POA: Diagnosis not present

## 2022-08-16 DIAGNOSIS — E1122 Type 2 diabetes mellitus with diabetic chronic kidney disease: Secondary | ICD-10-CM | POA: Diagnosis not present

## 2022-08-16 DIAGNOSIS — M4316 Spondylolisthesis, lumbar region: Secondary | ICD-10-CM | POA: Diagnosis not present

## 2022-08-16 MED ORDER — NEOMYCIN-POLYMYXIN-HC 1 % OT SOLN: OTIC | 1 refills | Status: DC

## 2022-08-16 NOTE — Progress Notes (Signed)
Subjective:  Patient ID: Kristen Kirk, female    DOB: 1951-08-16,  MRN: [id number] HPI Chief Complaint  Patient presents with   Toe Pain    Hallux bilateral - both borders, previous ingrown toenail procedure, says they feel like they are growing in again   New Patient (Initial Visit)    Est pt 2017    71 y.o. female presents with the above complaint.   ROS: Denies fever chills nausea vomit muscle aches pains calf pain back pain chest pain shortness of breath.  Past Medical History:  Diagnosis Date   Anemia in chronic kidney disease    a. s/p aranesp 11/2013 now starting epo with HD 06/2014   Diabetes mellitus type II    a. Currently diet controlled since losing weight.   DJD (degenerative joint disease)    Essential hypertension    GERD (gastroesophageal reflux disease)    Glaucoma, both eyes    a. L>R   History of echocardiogram    a. 01/2015 Echo [facility]): EF ?55%, triv MR, mildly dil LA, nl PV.   History of end stage renal disease    previous on HD;   received kidney transplant 07/ 2018 (on dialysis since 2015 MWF)  followed by [facility] renal (Dr. Daphane Shepherd) considering transplant Access: LUE fistula Establishing with [facility]   History of stress test    a. 03/2013 My[facility]): EF 63%, no ischemia.   History of syncope    a. 12/2015.   History of Urge Incontinence    HLD (hyperlipidemia)    Osteoarthritis    Osteoporosis 05/03/2019   DEXA 04/2019 - 1.5 spine, -2.1 hip, -2.5 forearm Check phosphate and PTH to guide further management in h/o kidney transplant   S/P kidney transplant 04/2017   _0 ---   per pt one kidney from living-donor   Vaginal atrophy    a. vagifem caused spotting, normal TVS   Vitamin D deficiency    Wears glasses    stated on 05/31/2022   Wears partial dentures    upper 2 teeth. 05/31/2022   Past Surgical History:  Procedure Laterality Date   ANAL RECTAL MANOMETRY N/A 03/16/2021   Procedure: ANO RECTAL MANOMETRY;  Surgeon: Mauri Pole, MD;  Location: [facility];  Service: Endoscopy;  Laterality: N/A;   BLADDER SUSPENSION  2011   for stress incontinence-Dr. MacDiarmid   BREAST BIOPSY Right 12/15/2020   fat necrosis   CATARACT EXTRACTION W/ INTRAOCULAR LENS IMPLANT Bilateral    left 02-25-2021;  right ?   COLONOSCOPY  03/2019   diverticulosis, rpt 10 yrs (Nandigam)   KIDNEY TRANSPLANT Right 04/03/2017   [facility])   left eye laser surgery  06/2011   LIGATION OF ARTERIOVENOUS  FISTULA  10/2017   thrombosed L arm AFV   NEPHRECTOMY TRANSPLANTED ORGAN     REFRACTIVE SURGERY     TUBAL LIGATION Bilateral    1990s    Current Outpatient Medications:    NEOMYCIN-POLYMYXIN-HYDROCORTISONE (CORTISPORIN) 1 % SOLN OTIC solution, Apply 1-2 drops to toe BID after soaking, Disp: 10 mL, Rfl: 1   alendronate (FOSAMAX) 70 MG tablet, Take 1 tablet (70 mg total) by mouth every 7 (seven) days. Take with a full glass of water on an empty stomach., Disp: , Rfl:    amLODipine (NORVASC) 2.5 MG tablet, Take 2.5 mg by mouth daily., Disp: , Rfl:    aspirin 81 MG tablet, Take 81 mg by mouth daily., Disp: , Rfl:    Blood Glucose  Monitoring Suppl (ONE TOUCH ULTRA SYSTEM KIT) W/DEVICE KIT, 1 kit by Does not apply route once., Disp: 1 each, Rfl: 0   carvedilol (COREG) 25 MG tablet, Take 12.5 mg by mouth 2 (two) times daily with a meal., Disp: , Rfl:    Cholecalciferol (VITAMIN D3) 1000 units CAPS, Take 2 capsules (2,000 Units total) by mouth daily., Disp: 60 capsule, Rfl:    docusate sodium (COLACE) 100 MG capsule, Take 100 mg by mouth 2 (two) times daily as needed for mild constipation or moderate constipation., Disp: , Rfl: 0   glucose blood test strip, Check sugars once daily, Disp: 100 each, Rfl: 11   hydrochlorothiazide (HYDRODIURIL) 12.5 MG tablet, Take 1 tablet (12.5 mg total) by mouth daily., Disp: 90 tablet, Rfl: 3   ketorolac (ACULAR) 0.5 % ophthalmic solution, Place 1 drop into the left eye 4 (four) times daily., Disp: , Rfl:     latanoprost (XALATAN) 0.005 % ophthalmic solution, Place 1 drop into the right eye at bedtime., Disp: , Rfl:    Melatonin 3 MG CAPS, Take 1-2 capsules (3-6 mg total) by mouth at bedtime as needed (sleep)., Disp: , Rfl: 0   mirabegron ER (MYRBETRIQ) 25 MG TB24 tablet, Take 25 mg by mouth daily., Disp: , Rfl:    Multiple Vitamin (MULTIVITAMIN) tablet, Take 1 tablet by mouth daily., Disp: , Rfl:    mycophenolate (CELLCEPT) 500 MG tablet, Take 500 mg by mouth 2 (two) times daily., Disp: , Rfl:    pantoprazole (PROTONIX) 20 MG tablet, TAKE 1 TABLET BY MOUTH EVERY DAY, Disp: 90 tablet, Rfl: 3   pravastatin (PRAVACHOL) 80 MG tablet, TAKE 1 TABLET BY MOUTH  EVERY EVENING (Patient taking differently: Take 80 mg by mouth daily.), Disp: 90 tablet, Rfl: 0   prednisoLONE acetate (PRED FORTE) 1 % ophthalmic suspension, Place 1 drop into the left eye every 2 (two) hours while awake., Disp: , Rfl:    tacrolimus (PROGRAF) 0.5 MG capsule, Take 0.5-1 mg by mouth See admin instructions. Take 2 capsules (13m) by mouth every morning and take 1 capsule (0.573m by mouth every night, Disp: , Rfl:    tiZANidine (ZANAFLEX) 2 MG tablet, Take 1 tablet (2 mg total) by mouth 3 times/day as needed-between meals & bedtime for muscle spasms., Disp: 10 tablet, Rfl: 0  Allergies  Allergen Reactions   Brimonidine     Other reaction(s): Other (See Comments) eye redness   Shellfish-Derived Products Swelling   Tramadol Other (See Comments)    Dizziness even at 1/2 tab   Review of Systems Objective:  There were no vitals filed for this visit.  General: Well developed, nourished, in no acute distress, alert and oriented x3   Dermatological: Skin is warm, dry and supple bilateral. Nails x 10 are well maintained; remaining integument appears unremarkable at this time. There are no open sores, no preulcerative lesions, no rash or signs of infection present.  Sharp and abraded borders hallux right no erythema cellulitis drainage or  odor mild hallux valgus is noted.  Vascular: Dorsalis Pedis artery and Posterior Tibial artery pedal pulses are 2/4 bilateral with immedate capillary fill time. Pedal hair growth present. No varicosities and no lower extremity edema present bilateral.   Neruologic: Grossly intact via light touch bilateral. Vibratory intact via tuning fork bilateral. Protective threshold with Semmes Wienstein monofilament intact to all pedal sites bilateral. Patellar and Achilles deep tendon reflexes 2+ bilateral. No Babinski or clonus noted bilateral.   Musculoskeletal: No gross boney pedal deformities  bilateral. No pain, crepitus, or limitation noted with foot and ankle range of motion bilateral. Muscular strength 5/5 in all groups tested bilateral. Mild hallux valgus right worse on the left side with osteoarthritic change Gait: Unassisted, Nonantalgic.    Radiographs:  None taken  Assessment & Plan:   Assessment: Ingrown toenail tibial border hallux right  Plan: Chemical matricectomy performed today after local anesthetic was administered tolerated procedure well without complications.  This provided both oral written home-going instruction for care and soaking the toe as well as a prescription for Cortisporin Otic to be applied twice daily after soaking.  Follow-up with her in 2 weeks     Roi Jafari T. [place], [place]

## 2022-08-16 NOTE — Patient Instructions (Signed)

## 2022-08-21 DIAGNOSIS — H18413 Arcus senilis, bilateral: Secondary | ICD-10-CM | POA: Diagnosis not present

## 2022-08-21 DIAGNOSIS — Z940 Kidney transplant status: Secondary | ICD-10-CM | POA: Diagnosis not present

## 2022-08-21 DIAGNOSIS — H02401 Unspecified ptosis of right eyelid: Secondary | ICD-10-CM | POA: Diagnosis not present

## 2022-08-21 DIAGNOSIS — H35352 Cystoid macular degeneration, left eye: Secondary | ICD-10-CM | POA: Diagnosis not present

## 2022-08-21 DIAGNOSIS — H401133 Primary open-angle glaucoma, bilateral, severe stage: Secondary | ICD-10-CM | POA: Diagnosis not present

## 2022-09-06 ENCOUNTER — Encounter: Payer: Self-pay | Admitting: Podiatry

## 2022-09-06 ENCOUNTER — Ambulatory Visit (INDEPENDENT_AMBULATORY_CARE_PROVIDER_SITE_OTHER): Payer: Medicare Other | Admitting: Podiatry

## 2022-09-06 DIAGNOSIS — L600 Ingrowing nail: Secondary | ICD-10-CM

## 2022-09-06 DIAGNOSIS — Z9889 Other specified postprocedural states: Secondary | ICD-10-CM

## 2022-09-06 NOTE — Progress Notes (Signed)
She presents today for follow-up of her nail check.  States that she was unable to soak on a regular basis because they left town and forgot the soaking instructions and medications.  Objective: Hallux right fibular border does not demonstrate any erythema cellulitis drainage or odor small scab is present.  Mildly tender on palpation.  Assessment: Resolving matrixectomy.  Plan: Continue to soak Epsom salts and warm water at least daily continue Cortisporin otic cover during the day leave open at bedtime probably for about another 2 weeks and then she should be done.

## 2022-09-07 DIAGNOSIS — H35352 Cystoid macular degeneration, left eye: Secondary | ICD-10-CM | POA: Insufficient documentation

## 2022-09-07 DIAGNOSIS — H02401 Unspecified ptosis of right eyelid: Secondary | ICD-10-CM | POA: Diagnosis not present

## 2022-09-07 DIAGNOSIS — H401123 Primary open-angle glaucoma, left eye, severe stage: Secondary | ICD-10-CM | POA: Diagnosis not present

## 2022-09-07 DIAGNOSIS — H401112 Primary open-angle glaucoma, right eye, moderate stage: Secondary | ICD-10-CM | POA: Diagnosis not present

## 2022-10-17 ENCOUNTER — Ambulatory Visit (INDEPENDENT_AMBULATORY_CARE_PROVIDER_SITE_OTHER): Payer: Medicare Other | Admitting: Family Medicine

## 2022-10-17 ENCOUNTER — Encounter: Payer: Self-pay | Admitting: Family Medicine

## 2022-10-17 VITALS — BP 134/64 | HR 62 | Temp 97.2°F | Ht 63.0 in | Wt 138.0 lb

## 2022-10-17 DIAGNOSIS — R413 Other amnesia: Secondary | ICD-10-CM | POA: Diagnosis not present

## 2022-10-17 DIAGNOSIS — R159 Full incontinence of feces: Secondary | ICD-10-CM | POA: Diagnosis not present

## 2022-10-17 DIAGNOSIS — H401133 Primary open-angle glaucoma, bilateral, severe stage: Secondary | ICD-10-CM

## 2022-10-17 DIAGNOSIS — G3184 Mild cognitive impairment, so stated: Secondary | ICD-10-CM | POA: Diagnosis not present

## 2022-10-17 DIAGNOSIS — E1129 Type 2 diabetes mellitus with other diabetic kidney complication: Secondary | ICD-10-CM | POA: Diagnosis not present

## 2022-10-17 DIAGNOSIS — E1169 Type 2 diabetes mellitus with other specified complication: Secondary | ICD-10-CM | POA: Diagnosis not present

## 2022-10-17 DIAGNOSIS — R152 Fecal urgency: Secondary | ICD-10-CM

## 2022-10-17 LAB — BASIC METABOLIC PANEL
BUN: 22 mg/dL (ref 6–23)
CO2: 24 mEq/L (ref 19–32)
Calcium: 10 mg/dL (ref 8.4–10.5)
Chloride: 106 mEq/L (ref 96–112)
Creatinine, Ser: 0.88 mg/dL (ref 0.40–1.20)
GFR: 66.71 mL/min (ref >60.00)
Glucose, Bld: 142 mg/dL — ABNORMAL HIGH (ref 70–99)
Potassium: 3.9 mEq/L (ref 3.5–5.1)
Sodium: 139 mEq/L (ref 135–145)

## 2022-10-17 LAB — POCT GLYCOSYLATED HEMOGLOBIN (HGB A1C): Hemoglobin A1C: 6 % — AB (ref 4.0–5.6)

## 2022-10-17 LAB — TSH: TSH: 2.05 u[IU]/mL (ref 0.35–5.50)

## 2022-10-17 LAB — VITAMIN B12: Vitamin B-12: 402 pg/mL (ref 211–911)

## 2022-10-17 MED ORDER — PRAVASTATIN SODIUM 80 MG PO TABS: 80.0000 mg | ORAL_TABLET | Freq: Every evening | ORAL | 3 refills | Status: DC

## 2022-10-17 NOTE — Assessment & Plan Note (Addendum)
Update A1c (6%) Currently diet controlled. Unclear when last eye exam was. Will ask to have her scheduled at next retinopathy screen in our office.

## 2022-10-17 NOTE — Progress Notes (Signed)
Patient ID: Kristen Kirk, female    DOB: 1950/11/29, 72 y.o.   MRN: [id number]  This visit was conducted in person.  BP 134/64   Pulse 62   Temp (!) 97.2 F (36.2 C) (Temporal)   Ht 5\' 3"  (1.6 m)   Wt 138 lb (62.6 kg)   LMP  (LMP Unknown)   SpO2 98%   BMI 24.45 kg/m    CC: 24mo DM f/u visit, memory assessment Subjective:   HPI: Kristen Kirk is a 72 y.o. female presenting on 10/17/2022 for Medical Management of Chronic Issues (Here for 3 mo memory and DM f/u .)   Last pap 2020 - ASCUS.   Kidney transplant 04/2017 on prograf and mycophenolate. Goal BP <130/80.   Fecal incontinence status post sacral nerve stimulator placement by Dr. Marcello Moores June 29, 2022.  Notes improvement in fecal incontinence after surgery however now noticing recurrence of incontinence - planning to f/u with gen surgery.  Regularly sees glaucoma doctor.   DM - does not regularly check sugars. Fasting 130-150s. Compliant with antihyperglycemic regimen which includes: diet controlled. Denies low sugars or hypoglycemic symptoms. Denies paresthesias. Notes ongoing blurry vision, possibly due to known glaucoma. Last diabetic eye exam: DUE. Glucometer brand: thinks it's accuchek. Last foot exam: 07/2022. DSME: unsure. Lab Results  Component Value Date   HGBA1C 6.0 (A) 10/17/2022   Diabetic Foot Exam - Simple   No data filed    Lab Results  Component Value Date   MICROALBUR 1.9 09/23/2020     Notes progressive difficulty with memory -   Geriatric Assessment: Activities of Daily Living:     Bathing- independent    Dressing- independent    Eating- independent    Toileting- independent    Transferring- independent    Continence- independent - notes occ stool and urine incontinence as above  Overall Assessment: independent  Instrumental Activities of Daily Living:     Transportation- independent - but doesn't drive at night due to glaucoma     Meal/Food Preparation- independent     Shopping Errands-independent    Housekeeping/Chores-independent    Money Management/Finances-independent    Medication Management-independent    Ability to Use Telephone-independent    Laundry-independent Overall Assessment: independent  Mental Status Exam: 24/30 (value/max value). Missed 1 orientation, 2 recall, 3 memory.   Clock Drawing Score: 3/4      Relevant past medical, surgical, family and social history reviewed and updated as indicated. Interim medical history since our last visit reviewed. Allergies and medications reviewed and updated. Outpatient Medications Prior to Visit  Medication Sig Dispense Refill   alendronate (FOSAMAX) 70 MG tablet Take 1 tablet (70 mg total) by mouth every 7 (seven) days. Take with a full glass of water on an empty stomach.     amLODipine (NORVASC) 2.5 MG tablet Take 2.5 mg by mouth daily.     aspirin 81 MG tablet Take 81 mg by mouth daily.     Blood Glucose Monitoring Suppl (ONE TOUCH ULTRA SYSTEM KIT) W/DEVICE KIT 1 kit by Does not apply route once. 1 each 0   carvedilol (COREG) 25 MG tablet Take 12.5 mg by mouth 2 (two) times daily with a meal.     Cholecalciferol (VITAMIN D3) 1000 units CAPS Take 2 capsules (2,000 Units total) by mouth daily. 60 capsule    docusate sodium (COLACE) 100 MG capsule Take 100 mg by mouth 2 (two) times daily as needed for mild constipation or moderate constipation.  0   glucose blood test strip Check sugars once daily 100 each 11   hydrochlorothiazide (HYDRODIURIL) 12.5 MG tablet Take 1 tablet (12.5 mg total) by mouth daily. 90 tablet 3   ketorolac (ACULAR) 0.5 % ophthalmic solution Place 1 drop into the left eye 4 (four) times daily.     latanoprost (XALATAN) 0.005 % ophthalmic solution Place 1 drop into the right eye at bedtime.     Melatonin 3 MG CAPS Take 1-2 capsules (3-6 mg total) by mouth at bedtime as needed (sleep).  0   mirabegron ER (MYRBETRIQ) 25 MG TB24 tablet Take 25 mg by mouth daily.     Multiple  Vitamin (MULTIVITAMIN) tablet Take 1 tablet by mouth daily.     mycophenolate (CELLCEPT) 500 MG tablet Take 500 mg by mouth 2 (two) times daily.     NEOMYCIN-POLYMYXIN-HYDROCORTISONE (CORTISPORIN) 1 % SOLN OTIC solution Apply 1-2 drops to toe BID after soaking 10 mL 1   pantoprazole (PROTONIX) 20 MG tablet TAKE 1 TABLET BY MOUTH EVERY DAY 90 tablet 3   prednisoLONE acetate (PRED FORTE) 1 % ophthalmic suspension Place 1 drop into the left eye every 2 (two) hours while awake.     tacrolimus (PROGRAF) 0.5 MG capsule Take 0.5-1 mg by mouth See admin instructions. Take 2 capsules (1mg ) by mouth every morning and take 1 capsule (0.5mg ) by mouth every night     tiZANidine (ZANAFLEX) 2 MG tablet Take 1 tablet (2 mg total) by mouth 3 times/day as needed-between meals & bedtime for muscle spasms. 10 tablet 0   pravastatin (PRAVACHOL) 80 MG tablet TAKE 1 TABLET BY MOUTH  EVERY EVENING (Patient taking differently: Take 80 mg by mouth daily.) 90 tablet 0   No facility-administered medications prior to visit.     Per HPI unless specifically indicated in ROS section below Review of Systems  Objective:  BP 134/64   Pulse 62   Temp (!) 97.2 F (36.2 C) (Temporal)   Ht 5\' 3"  (1.6 m)   Wt 138 lb (62.6 kg)   LMP  (LMP Unknown)   SpO2 98%   BMI 24.45 kg/m   Wt Readings from Last 3 Encounters:  10/17/22 138 lb (62.6 kg)  08/08/22 133 lb 6 oz (60.5 kg)  07/17/22 134 lb 6 oz (61 kg)      Physical Exam Vitals and nursing note reviewed.  Constitutional:      Appearance: Normal appearance. She is not ill-appearing.  Neurological:     Mental Status: She is alert.  Psychiatric:        Mood and Affect: Mood normal.        Behavior: Behavior normal.       Results for orders placed or performed in visit on 52/84/13  Basic metabolic panel  Result Value Ref Range   Sodium 139 135 - 145 mEq/L   Potassium 3.9 3.5 - 5.1 mEq/L   Chloride 106 96 - 112 mEq/L   CO2 24 19 - 32 mEq/L   Glucose, Bld 142 (H)  70 - 99 mg/dL   BUN 22 6 - 23 mg/dL   Creatinine, Ser 0.88 0.40 - 1.20 mg/dL   GFR 66.71 >60.00 mL/min   Calcium 10.0 8.4 - 10.5 mg/dL  TSH  Result Value Ref Range   TSH 2.05 0.35 - 5.50 uIU/mL  Vitamin B12  Result Value Ref Range   Vitamin B-12 402 211 - 911 pg/mL  POCT glycosylated hemoglobin (Hb A1C)  Result Value Ref Range  Hemoglobin A1C 6.0 (A) 4.0 - 5.6 %   HbA1c POC (<> result, manual entry)     HbA1c, POC (prediabetic range)     HbA1c, POC (controlled diabetic range)      Assessment & Plan:   Problem List Items Addressed This Visit       Acute   Incontinence of feces    Initial improvement after sacral nerve stimulator placement 06/2022, now over the past week noticing recurring bowel incontinence - she is planning to call gen surgery for evaluation.         Other   Primary open angle glaucoma (POAG) of both eyes, severe stage    Regularly sees eye doctor.       Type 2 diabetes mellitus with other specified complication (HCC)    Update A1c (6%) Currently diet controlled. Unclear when last eye exam was. Will ask to have her scheduled at next retinopathy screen in our office.      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 Discussed with patient. She is still doing well at home - anticipate MCI. Reviewed lifestyle modifications as per instructions to support a healthy mind. Discussed aricept (donepezil). Reassess at 3 mo f/u visit. Check memory labs today.       Relevant Orders   TSH (Completed)   Vitamin B12 (Completed)   RPR   Type II diabetes mellitus with renal manifestations (HCC)   Relevant Medications   pravastatin (PRAVACHOL) 80 MG tablet     Meds ordered this encounter  Medications   pravastatin  (PRAVACHOL) 80 MG tablet    Sig: Take 1 tablet (80 mg total) by mouth every evening.    Dispense:  90 tablet    Refill:  3    Orders Placed This Encounter  Procedures   Basic metabolic panel   TSH   Vitamin B12   RPR   POCT glycosylated hemoglobin (Hb A1C)    Patient Instructions  Labs today Testing today suspicious for developing memory difficulty ?dementia. We should consider taking medication like aricept (donepezil). Let me know if interested - monitoring for slowing of heart rate or GI upset.  For sure do below 4 things support a healthy mind:  1. Nutritious well balance diet.  2. Regular physical activity routine.  3. Regular mental activity such as reading books, word puzzles, math puzzles, jigsaw puzzles.  4. Social engagement.  Also ensure good blood pressure control, limit alcohol, no smoking.    Return in 3 months for follow up visit.   Follow up plan: Return in about 3 months (around 01/16/2023), or if symptoms worsen or fail to improve, for follow up visit.  Ria Bush, MD

## 2022-10-17 NOTE — Patient Instructions (Addendum)
Labs today Testing today suspicious for developing memory difficulty ?dementia. We should consider taking medication like aricept (donepezil). Let me know if interested - monitoring for slowing of heart rate or GI upset.  For sure do below 4 things support a healthy mind:  1. Nutritious well balance diet.  2. Regular physical activity routine.  3. Regular mental activity such as reading books, word puzzles, math puzzles, jigsaw puzzles.  4. Social engagement.  Also ensure good blood pressure control, limit alcohol, no smoking.    Return in 3 months for follow up visit.

## 2022-10-18 ENCOUNTER — Telehealth: Payer: Self-pay

## 2022-10-18 DIAGNOSIS — R350 Frequency of micturition: Secondary | ICD-10-CM | POA: Diagnosis not present

## 2022-10-18 DIAGNOSIS — N3946 Mixed incontinence: Secondary | ICD-10-CM | POA: Diagnosis not present

## 2022-10-18 LAB — RPR: RPR Ser Ql: NONREACTIVE

## 2022-10-18 NOTE — Telephone Encounter (Addendum)
Attempted to contact pt. No answer. Vm not set up. Need to relay lab results and Dr. Synthia Innocent message.  Labs/Dr. Synthia Innocent msg: Your kidneys along with memory testing (thyroid, vitamin b12) returned normal. A1c was great at 6%.  Will further discuss possible memory medicine start at f/u visit.

## 2022-10-18 NOTE — Assessment & Plan Note (Signed)
Regularly sees eye doctor.

## 2022-10-18 NOTE — Assessment & Plan Note (Signed)
MMSE concerning for development of memory issue. Discussed with patient. She is still doing well at home - anticipate MCI. Reviewed lifestyle modifications as per instructions to support a healthy mind. Discussed aricept (donepezil). Reassess at 3 mo f/u visit. Check memory labs today.

## 2022-10-18 NOTE — Assessment & Plan Note (Signed)
Initial improvement after sacral nerve stimulator placement 06/2022, now over the past week noticing recurring bowel incontinence - she is planning to call gen surgery for evaluation.

## 2022-10-19 NOTE — Telephone Encounter (Addendum)
Attempted to contact pt. No answer. Vm not set up. Need to relay lab results and Dr. Synthia Innocent message.  Labs/Dr. Synthia Innocent msg: Your kidneys along with memory testing (thyroid, vitamin b12) returned normal. A1c was great at 6%.  Will further discuss possible memory medicine start at f/u visit.

## 2022-10-20 NOTE — Telephone Encounter (Signed)
Spoke with pt relaying results and Dr. G's message.  Pt verbalizes understanding.  

## 2022-10-26 DIAGNOSIS — R350 Frequency of micturition: Secondary | ICD-10-CM | POA: Diagnosis not present

## 2022-10-26 DIAGNOSIS — N3946 Mixed incontinence: Secondary | ICD-10-CM | POA: Diagnosis not present

## 2022-10-27 DIAGNOSIS — Z940 Kidney transplant status: Secondary | ICD-10-CM | POA: Diagnosis not present

## 2022-10-27 DIAGNOSIS — R7989 Other specified abnormal findings of blood chemistry: Secondary | ICD-10-CM | POA: Diagnosis not present

## 2022-10-27 DIAGNOSIS — E559 Vitamin D deficiency, unspecified: Secondary | ICD-10-CM | POA: Diagnosis not present

## 2022-10-27 DIAGNOSIS — N186 End stage renal disease: Secondary | ICD-10-CM | POA: Diagnosis not present

## 2022-10-30 DIAGNOSIS — H401112 Primary open-angle glaucoma, right eye, moderate stage: Secondary | ICD-10-CM | POA: Diagnosis not present

## 2022-10-30 DIAGNOSIS — H43813 Vitreous degeneration, bilateral: Secondary | ICD-10-CM | POA: Diagnosis not present

## 2022-10-30 DIAGNOSIS — H401123 Primary open-angle glaucoma, left eye, severe stage: Secondary | ICD-10-CM | POA: Diagnosis not present

## 2022-10-30 DIAGNOSIS — H02401 Unspecified ptosis of right eyelid: Secondary | ICD-10-CM | POA: Diagnosis not present

## 2022-11-02 DIAGNOSIS — N2889 Other specified disorders of kidney and ureter: Secondary | ICD-10-CM | POA: Diagnosis not present

## 2022-11-02 DIAGNOSIS — Z79899 Other long term (current) drug therapy: Secondary | ICD-10-CM | POA: Diagnosis not present

## 2022-11-02 DIAGNOSIS — Z940 Kidney transplant status: Secondary | ICD-10-CM | POA: Diagnosis not present

## 2022-11-02 DIAGNOSIS — D849 Immunodeficiency, unspecified: Secondary | ICD-10-CM | POA: Diagnosis not present

## 2022-11-02 DIAGNOSIS — R7309 Other abnormal glucose: Secondary | ICD-10-CM | POA: Diagnosis not present

## 2022-11-02 DIAGNOSIS — N281 Cyst of kidney, acquired: Secondary | ICD-10-CM | POA: Diagnosis not present

## 2022-11-16 DIAGNOSIS — H43813 Vitreous degeneration, bilateral: Secondary | ICD-10-CM | POA: Diagnosis not present

## 2022-11-16 DIAGNOSIS — H35352 Cystoid macular degeneration, left eye: Secondary | ICD-10-CM | POA: Diagnosis not present

## 2022-11-16 DIAGNOSIS — H02401 Unspecified ptosis of right eyelid: Secondary | ICD-10-CM | POA: Diagnosis not present

## 2022-11-16 DIAGNOSIS — H18413 Arcus senilis, bilateral: Secondary | ICD-10-CM | POA: Diagnosis not present

## 2022-11-17 DIAGNOSIS — H524 Presbyopia: Secondary | ICD-10-CM | POA: Diagnosis not present

## 2022-11-21 DIAGNOSIS — N186 End stage renal disease: Secondary | ICD-10-CM | POA: Diagnosis not present

## 2022-11-21 DIAGNOSIS — Z940 Kidney transplant status: Secondary | ICD-10-CM | POA: Diagnosis not present

## 2022-12-05 DIAGNOSIS — R159 Full incontinence of feces: Secondary | ICD-10-CM | POA: Diagnosis not present

## 2022-12-14 DIAGNOSIS — Z940 Kidney transplant status: Secondary | ICD-10-CM | POA: Diagnosis not present

## 2022-12-14 DIAGNOSIS — H401133 Primary open-angle glaucoma, bilateral, severe stage: Secondary | ICD-10-CM | POA: Diagnosis not present

## 2023-01-01 ENCOUNTER — Encounter: Payer: Self-pay | Admitting: Family Medicine

## 2023-01-01 ENCOUNTER — Ambulatory Visit (INDEPENDENT_AMBULATORY_CARE_PROVIDER_SITE_OTHER): Payer: Medicare Other | Admitting: Family Medicine

## 2023-01-01 VITALS — BP 154/74 | HR 74 | Temp 97.9°F | Ht 63.0 in | Wt 133.3 lb

## 2023-01-01 DIAGNOSIS — R159 Full incontinence of feces: Secondary | ICD-10-CM | POA: Diagnosis not present

## 2023-01-01 DIAGNOSIS — L988 Other specified disorders of the skin and subcutaneous tissue: Secondary | ICD-10-CM

## 2023-01-01 DIAGNOSIS — R195 Other fecal abnormalities: Secondary | ICD-10-CM | POA: Diagnosis not present

## 2023-01-01 DIAGNOSIS — R152 Fecal urgency: Secondary | ICD-10-CM

## 2023-01-01 DIAGNOSIS — K921 Melena: Secondary | ICD-10-CM

## 2023-01-01 DIAGNOSIS — Z940 Kidney transplant status: Secondary | ICD-10-CM | POA: Diagnosis not present

## 2023-01-01 LAB — HEMOCCULT GUIAC POC 1CARD (OFFICE): Fecal Occult Blood, POC: POSITIVE — AB

## 2023-01-01 MED ORDER — FAMOTIDINE 20 MG PO TABS: 20.0000 mg | ORAL_TABLET | Freq: Every day | ORAL | 2 refills | Status: DC

## 2023-01-01 NOTE — Patient Instructions (Addendum)
For rash around nipples - try lotrimin antifungal cream for 1-2 weeks - let us know if not resolved with this.  For dark stools - further labs today. Start pepcid 20mg  nightly in addition to your pantoprazole 20mg . We will refer you to [place] doctor for further evaluation of possible blood in stool.

## 2023-01-01 NOTE — Progress Notes (Unsigned)
Patient ID: Kristen Kirk, female    DOB: [birth year], 72 y.o.   MRN: [id number]  This visit was conducted in person.  BP (!) 154/74 (BP Location: Right Arm, Cuff Size: Normal)   Pulse 74   Temp 97.9 F (36.6 C) (Skin)   Ht 5\' 3"  (1.6 m)   Wt 133 lb 4.8 oz (60.5 kg)   LMP  (LMP Unknown)   SpO2 98%   BMI 23.61 kg/m   BP Readings from Last 3 Encounters:  01/01/23 (!) 154/74  10/17/22 134/64  08/08/22 132/64   CC: loose black stools  Subjective:   HPI: Kristen Kirk is a 72 y.o. female presenting on 01/01/2023 for Diarrhea (Black stools, off and on since device was put in)   H/o kidney transplant to treat ESRD 04/2017 at [facility].   H/o fecal incontinence with anal manometry showing decreased internal sphincter function s/p sacral nerve stimulator placement 06/2022 (Dr Marcello Moores @ [place]).   Presents today with 2 weeks of "black stools" some loose, some more formed.  Stool is more foul smelling than normal.  No blood in stool. No fevers/chills, abd pain, nausea/vomiting. No chest pain, dyspnea or dizziness/palpitations.  No NSAID use.  No alcohol use.  No oral iron replacement.  She has been taking pepto bismol more recently - but had diarrhea and dark stools prior to starting.  She is on blood thinner aspirin 81mg  daily.   She is in touch with [facility].   Labs with [facility] transplant team 12/14/2022: WBC 7.1, Hgb 12.7, plt 197, Cr 0.85.   By the way - noted brown spots on skin to left breast, asxs.      Relevant past medical, surgical, family and social history reviewed and updated as indicated. Interim medical history since our last visit reviewed. Allergies and medications reviewed and updated. Outpatient Medications Prior to Visit  Medication Sig Dispense Refill   alendronate (FOSAMAX) 70 MG tablet Take 1 tablet (70 mg total) by mouth every 7 (seven) days. Take with a full glass of water on an empty stomach.     amLODipine (NORVASC) 2.5 MG tablet Take 2.5 mg by mouth  daily.     apraclonidine (IOPIDINE) 0.5 % ophthalmic solution Place 1 drop into the right eye daily.     aspirin 81 MG tablet Take 81 mg by mouth daily.     Blood Glucose Monitoring Suppl (ONE TOUCH ULTRA SYSTEM KIT) W/DEVICE KIT 1 kit by Does not apply route once. 1 each 0   carvedilol (COREG) 25 MG tablet Take 12.5 mg by mouth 2 (two) times daily with a meal.     Cholecalciferol (VITAMIN D3) 1000 units CAPS Take 2 capsules (2,000 Units total) by mouth daily. 60 capsule    docusate sodium (COLACE) 100 MG capsule Take 100 mg by mouth 2 (two) times daily as needed for mild constipation or moderate constipation.  0   glucose blood test strip Check sugars once daily 100 each 11   hydrochlorothiazide (HYDRODIURIL) 12.5 MG tablet Take 1 tablet (12.5 mg total) by mouth daily. 90 tablet 3   ketorolac (ACULAR) 0.5 % ophthalmic solution Place 1 drop into the left eye 4 (four) times daily.     latanoprost (XALATAN) 0.005 % ophthalmic solution Place 1 drop into the right eye at bedtime.     Melatonin 3 MG CAPS Take 1-2 capsules (3-6 mg total) by mouth at bedtime as needed (sleep).  0   mirabegron ER (MYRBETRIQ) 25 MG TB24  tablet Take 25 mg by mouth daily.     Multiple Vitamin (MULTIVITAMIN) tablet Take 1 tablet by mouth daily.     mycophenolate (CELLCEPT) 500 MG tablet Take 500 mg by mouth 2 (two) times daily.     NEOMYCIN-POLYMYXIN-HYDROCORTISONE (CORTISPORIN) 1 % SOLN OTIC solution Apply 1-2 drops to toe BID after soaking 10 mL 1   pantoprazole (PROTONIX) 20 MG tablet TAKE 1 TABLET BY MOUTH EVERY DAY 90 tablet 3   pravastatin (PRAVACHOL) 80 MG tablet Take 1 tablet (80 mg total) by mouth every evening. 90 tablet 3   prednisoLONE acetate (PRED FORTE) 1 % ophthalmic suspension Place 1 drop into the left eye every 2 (two) hours while awake.     tacrolimus (PROGRAF) 0.5 MG capsule Take 0.5-1 mg by mouth See admin instructions. Take 2 capsules (1mg ) by mouth every morning and take 1 capsule (0.5mg ) by mouth  every night     tiZANidine (ZANAFLEX) 2 MG tablet Take 1 tablet (2 mg total) by mouth 3 times/day as needed-between meals & bedtime for muscle spasms. 10 tablet 0   No facility-administered medications prior to visit.     Per HPI unless specifically indicated in ROS section below Review of Systems  Objective:  BP (!) 154/74 (BP Location: Right Arm, Cuff Size: Normal)   Pulse 74   Temp 97.9 F (36.6 C) (Skin)   Ht 5\' 3"  (1.6 m)   Wt 133 lb 4.8 oz (60.5 kg)   LMP  (LMP Unknown)   SpO2 98%   BMI 23.61 kg/m   Wt Readings from Last 3 Encounters:  01/01/23 133 lb 4.8 oz (60.5 kg)  10/17/22 138 lb (62.6 kg)  08/08/22 133 lb 6 oz (60.5 kg)      Physical Exam Vitals and nursing note reviewed.  Constitutional:      Appearance: Normal appearance. She is not ill-appearing.  Cardiovascular:     Rate and Rhythm: Normal rate and regular rhythm.     Pulses: Normal pulses.     Heart sounds: Normal heart sounds. No murmur heard. Pulmonary:     Effort: Pulmonary effort is normal. No respiratory distress.     Breath sounds: Normal breath sounds. No wheezing, rhonchi or rales.  Chest:     Comments: Hyperpigmented brown slightly scaly macules to bilateral breasts superior to nipples/areola  Genitourinary:    Rectum: Guaiac result positive. No mass, tenderness, anal fissure, external hemorrhoid or internal hemorrhoid. Normal anal tone.     Comments: Few small pieces of firm hard stool in rectal vault Musculoskeletal:     Right lower leg: No edema.     Left lower leg: No edema.  Skin:    General: Skin is warm and dry.     Findings: No rash.  Neurological:     Mental Status: She is alert.  Psychiatric:        Mood and Affect: Mood normal.        Behavior: Behavior normal.       Results for orders placed or performed in visit on 01/01/23  Hemoccult - 1 Card (office)  Result Value Ref Range   Fecal Occult Blood, POC Positive (A) Negative   Card #1 Date     Card #2 Fecal Occult Blod,  POC     Card #2 Date     Card #3 Fecal Occult Blood, POC     Card #3 Date     Lab Results  Component Value Date   WBC 7.7 07/10/2022  HGB 11.3 (L) 07/10/2022   HCT 36.6 07/10/2022   MCV 83.8 07/10/2022   PLT 230 07/10/2022    Lab Results  Component Value Date   HGBA1C 6.0 (A) 10/17/2022    Lab Results  Component Value Date   CREATININE 0.88 10/17/2022   BUN 22 10/17/2022   NA 139 10/17/2022   K 3.9 10/17/2022   CL 106 10/17/2022   CO2 24 10/17/2022    Assessment & Plan:   Problem List Items Addressed This Visit       Acute   Incontinence of feces    Notes improvement since sacral nerve stimulator placement 06/2022.        Other   History of renal transplant   Dark stools - Primary    Notes at least 2 wk h/o dark loose stools, present even before taking pepto bismol. Hemoccult positive in office today.  She is already on pantoprazole 20mg  daily - will add pepcid 20mg  nightly. Check CBC, CMP today.  Refer back to GI for further evaluation. ER precautions reviewed.       Relevant Orders   Hemoccult - 1 Card (office) (Completed)   CBC with Differential/Platelet   Comprehensive metabolic panel   Ambulatory referral to Gastroenterology   Skin lesion of breast    Possible tinea - rec clotrimazole course x 2-4 wks, update with effect. Not consistent with peau d'orange skin changes.       Other Visit Diagnoses     Positive fecal occult blood test       Relevant Orders   Ambulatory referral to Gastroenterology        Meds ordered this encounter  Medications   famotidine (PEPCID) 20 MG tablet    Sig: Take 1 tablet (20 mg total) by mouth at bedtime.    Dispense:  30 tablet    Refill:  2    Orders Placed This Encounter  Procedures   CBC with Differential/Platelet   Comprehensive metabolic panel   Ambulatory referral to Gastroenterology    Referral Priority:   Routine    Referral Type:   Consultation    Referral Reason:   Specialty Services  Required    Number of Visits Requested:   1   Hemoccult - 1 Card (office)    Patient Instructions  For rash around nipples - try lotrimin antifungal cream for 1-2 weeks - let us know if not resolved with this.  For dark stools - further labs today. Start pepcid 20mg  nightly in addition to your pantoprazole 20mg . We will refer you to [place] doctor for further evaluation of possible blood in stool.   Follow up plan: Return if symptoms worsen or fail to improve.  Ria Bush, MD

## 2023-01-02 DIAGNOSIS — L988 Other specified disorders of the skin and subcutaneous tissue: Secondary | ICD-10-CM | POA: Insufficient documentation

## 2023-01-02 LAB — COMPREHENSIVE METABOLIC PANEL
ALT: 12 U/L (ref 0–35)
AST: 17 U/L (ref 0–37)
Albumin: 4.5 g/dL (ref 3.5–5.2)
Alkaline Phosphatase: 71 U/L (ref 39–117)
BUN: 30 mg/dL — ABNORMAL HIGH (ref 6–23)
CO2: 25 mEq/L (ref 19–32)
Calcium: 9.8 mg/dL (ref 8.4–10.5)
Chloride: 107 mEq/L (ref 96–112)
Creatinine, Ser: 1.14 mg/dL (ref 0.40–1.20)
GFR: 48.83 mL/min — ABNORMAL LOW (ref >60.00)
Glucose, Bld: 121 mg/dL — ABNORMAL HIGH (ref 70–99)
Potassium: 4.1 mEq/L (ref 3.5–5.1)
Sodium: 139 mEq/L (ref 135–145)
Total Bilirubin: 0.5 mg/dL (ref 0.2–1.2)
Total Protein: 7.5 g/dL (ref 6.0–8.3)

## 2023-01-02 LAB — CBC WITH DIFFERENTIAL/PLATELET
Basophils Absolute: 0 10*3/uL (ref 0.0–0.1)
Basophils Relative: 0.6 % (ref 0.0–3.0)
Eosinophils Absolute: 0.2 10*3/uL (ref 0.0–0.7)
Eosinophils Relative: 2.6 % (ref 0.0–5.0)
HCT: 41.5 % (ref 36.0–46.0)
Hemoglobin: 13.4 g/dL (ref 12.0–15.0)
Lymphocytes Relative: 18.3 % (ref 12.0–46.0)
Lymphs Abs: 1.1 10*3/uL (ref 0.7–4.0)
MCHC: 32.2 g/dL (ref 30.0–36.0)
MCV: 84.8 fl (ref 78.0–100.0)
Monocytes Absolute: 0.6 10*3/uL (ref 0.1–1.0)
Monocytes Relative: 9.6 % (ref 3.0–12.0)
Neutro Abs: 4.3 10*3/uL (ref 1.4–7.7)
Neutrophils Relative %: 68.9 % (ref 43.0–77.0)
Platelets: 232 10*3/uL (ref 150.0–400.0)
RBC: 4.9 Mil/uL (ref 3.87–5.11)
RDW: 14.5 % (ref 11.5–15.5)
WBC: 6.2 10*3/uL (ref 4.0–10.5)

## 2023-01-02 NOTE — Assessment & Plan Note (Signed)
Notes at least 2 wk h/o dark loose stools, present even before taking pepto bismol. Hemoccult positive in office today.  She is already on pantoprazole 20mg  daily - will add pepcid 20mg  nightly. Check CBC, CMP today.  Refer back to GI for further evaluation. ER precautions reviewed.

## 2023-01-02 NOTE — Assessment & Plan Note (Signed)
Possible tinea - rec clotrimazole course x 2-4 wks, update with effect. Not consistent with peau d'orange skin changes.

## 2023-01-02 NOTE — Assessment & Plan Note (Signed)
Notes improvement since sacral nerve stimulator placement 06/2022.

## 2023-01-08 DIAGNOSIS — H401133 Primary open-angle glaucoma, bilateral, severe stage: Secondary | ICD-10-CM | POA: Diagnosis not present

## 2023-01-08 DIAGNOSIS — H401123 Primary open-angle glaucoma, left eye, severe stage: Secondary | ICD-10-CM | POA: Diagnosis not present

## 2023-01-17 ENCOUNTER — Encounter: Payer: Self-pay | Admitting: Family Medicine

## 2023-01-17 ENCOUNTER — Ambulatory Visit (INDEPENDENT_AMBULATORY_CARE_PROVIDER_SITE_OTHER): Payer: Medicare Other | Admitting: Family Medicine

## 2023-01-17 VITALS — BP 122/72 | HR 67 | Temp 97.1°F | Ht 63.0 in | Wt 131.1 lb

## 2023-01-17 DIAGNOSIS — E1169 Type 2 diabetes mellitus with other specified complication: Secondary | ICD-10-CM | POA: Diagnosis not present

## 2023-01-17 DIAGNOSIS — E1129 Type 2 diabetes mellitus with other diabetic kidney complication: Secondary | ICD-10-CM

## 2023-01-17 DIAGNOSIS — D849 Immunodeficiency, unspecified: Secondary | ICD-10-CM

## 2023-01-17 DIAGNOSIS — M2170 Unequal limb length (acquired), unspecified site: Secondary | ICD-10-CM | POA: Diagnosis not present

## 2023-01-17 DIAGNOSIS — G3184 Mild cognitive impairment, so stated: Secondary | ICD-10-CM

## 2023-01-17 DIAGNOSIS — R195 Other fecal abnormalities: Secondary | ICD-10-CM

## 2023-01-17 DIAGNOSIS — R159 Full incontinence of feces: Secondary | ICD-10-CM | POA: Diagnosis not present

## 2023-01-17 DIAGNOSIS — R152 Fecal urgency: Secondary | ICD-10-CM

## 2023-01-17 LAB — POCT GLYCOSYLATED HEMOGLOBIN (HGB A1C): Hemoglobin A1C: 6.5 % — AB (ref 4.0–5.6)

## 2023-01-17 MED ORDER — MEMANTINE HCL 5 MG PO TABS
5.0000 mg | ORAL_TABLET | Freq: Every day | ORAL | 6 refills | Status: DC
Start: 2023-01-17 — End: 2023-08-21

## 2023-01-17 NOTE — Assessment & Plan Note (Addendum)
Notes ongoing difficulty. Prior MMSE earlier this year 24/30, CDT 3/4. Avoid aricept in h/o GERD/GI upset. Will trial namenda  daily with option to titrate dose slowly. Reassess at f/u visit in 3 months.

## 2023-01-17 NOTE — Assessment & Plan Note (Addendum)
Ongoing discomfort to hips with ambulation presumed from leg length discrepancy.  Saw sports med 2022 with 1.5cm measured difference in leg lengths (L shorter).  She didn't feel she benefited from heel lifts previously provided. She never saw orthopedist (referred 04/2022). Will offer podiatry referral to eval for orthotics.

## 2023-01-17 NOTE — Assessment & Plan Note (Signed)
This is largely improved - she continues pantoprazole  daily and pepcid  nightly. She did have + hemoccult in office last visit. She has GI appt scheduled for next month.

## 2023-01-17 NOTE — Progress Notes (Signed)
Ph: [phone]       Fax: [phone]   Patient ID: Kristen Kirk, female    DOB: 12/26/1950, 72 y.o.   MRN: [id number]  This visit was conducted in person.  BP 122/72   Pulse 67   Temp (!) 97.1 F (36.2 C) (Temporal)   Ht  (1.6 m)   Wt 131 lb 2 oz (59.5 kg)   LMP  (LMP Unknown)   SpO2 98%   BMI 23.23 kg/m    CC: 3 mo DM f/u visit  Subjective:   HPI: Kristen Kirk is a 72 y.o. female presenting on 01/17/2023 for Medical Management of Chronic Issues (Here for DM and 3 mo memory f/u.)   H/o kidney transplant to treat ESRD 04/2017 at [facility].    H/o fecal incontinence with anal manometry showing decreased internal sphincter function s/p sacral nerve stimulator placement 06/2022 (Dr Maisie Fus @ [facility]).  Recent dark stools with + iFOB - referred to GI - appt pending 02/23/2023 (Amy PA at [facility]). No further dark stools noted. She continues pantoprazole  daily with pepcid  nightly.  COLONOSCOPY 03/2019 - diverticulosis, rpt 10 yrs (Nandigam)  Ongoing memory difficulty - eval 10/2022: MMSE 24/30, CDT 3/4, TSH, b12, RPR normal at that time. She notes ongoing difficulty - trouble remembering names, repeating questions, remembering conversations. No shaking tremors, stiffness or slowed movements, imbalance.   Leg length discrepancy with ongoing hip pain with ambulation - last saw sports med Dr Patsy Lager 01/2021 - placed in 1cm lift for left. Doesn't feel this was helpful.   DM - does not regularly check sugars - last checked a few weeks ago, was doesn't remember reading. Compliant with antihyperglycemic regimen which includes: diet controlled. Denies low sugars or hypoglycemic symptoms. Denies paresthesias, blurry vision. Last diabetic eye exam DUE. Glucometer brand: ?accuchek. Last foot exam: 07/2022. DSME: unsure. Lab Results  Component Value Date   HGBA1C 6.5 (A) 01/17/2023   Diabetic Foot Exam - Simple   No data filed    Lab Results  Component Value Date   MICROALBUR  1.9 09/23/2020         Relevant past medical, surgical, family and social history reviewed and updated as indicated. Interim medical history since our last visit reviewed. Allergies and medications reviewed and updated. Outpatient Medications Prior to Visit  Medication Sig Dispense Refill   alendronate (FOSAMAX) 70 MG tablet Take 1 tablet (70 mg total) by mouth every 7 (seven) days. Take with a full glass of water on an empty stomach.     amLODipine (NORVASC) 2.5 MG tablet Take 2.5 mg by mouth daily.     apraclonidine (IOPIDINE) 0.5 % ophthalmic solution Place 1 drop into the right eye daily.     aspirin 81 MG tablet Take 81 mg by mouth daily.     Blood Glucose Monitoring Suppl (ONE TOUCH ULTRA SYSTEM KIT) W/DEVICE KIT 1 kit by Does not apply route once. 1 each 0   carvedilol (COREG) 25 MG tablet Take 12.5 mg by mouth 2 (two) times daily with a meal.     Cholecalciferol (VITAMIN D3) 1000 units CAPS Take 2 capsules (2,000 Units total) by mouth daily. 60 capsule    docusate sodium (COLACE) 100 MG capsule Take 100 mg by mouth 2 (two) times daily as needed for mild constipation or moderate constipation.  0   dorzolamide-timolol (COSOPT) 2-0.5 % ophthalmic solution Apply to eye.     famotidine (PEPCID) 20 MG tablet Take 1 tablet (  20 mg total) by mouth at bedtime. 30 tablet 2   glucose blood test strip Check sugars once daily 100 each 11   hydrochlorothiazide (HYDRODIURIL) 12.5 MG tablet Take 1 tablet (12.5 mg total) by mouth daily. 90 tablet 3   ketorolac (ACULAR) 0.5 % ophthalmic solution Place 1 drop into the left eye 4 (four) times daily.     latanoprost (XALATAN) 0.005 % ophthalmic solution Place 1 drop into the right eye at bedtime.     Melatonin 3 MG CAPS Take 1-2 capsules (3-6 mg total) by mouth at bedtime as needed (sleep).  0   mirabegron ER (MYRBETRIQ) 25 MG TB24 tablet Take 25 mg by mouth daily.     Multiple Vitamin (MULTIVITAMIN) tablet Take 1 tablet by mouth daily.     mycophenolate  (CELLCEPT) 500 MG tablet Take 500 mg by mouth 2 (two) times daily.     NEOMYCIN-POLYMYXIN-HYDROCORTISONE (CORTISPORIN) 1 % SOLN OTIC solution Apply 1-2 drops to toe BID after soaking 10 mL 1   pantoprazole (PROTONIX) 20 MG tablet TAKE 1 TABLET BY MOUTH EVERY DAY 90 tablet 3   pravastatin (PRAVACHOL) 80 MG tablet Take 1 tablet (80 mg total) by mouth every evening. 90 tablet 3   prednisoLONE acetate (PRED FORTE) 1 % ophthalmic suspension Place 1 drop into the left eye every 2 (two) hours while awake.     tacrolimus (PROGRAF) 0.5 MG capsule Take 0.5-1 mg by mouth See admin instructions. Take 2 capsules (1mg ) by mouth every morning and take 1 capsule (0.5mg ) by mouth every night     tiZANidine (ZANAFLEX) 2 MG tablet Take 1 tablet (2 mg total) by mouth 3 times/day as needed-between meals & bedtime for muscle spasms. 10 tablet 0   No facility-administered medications prior to visit.     Per HPI unless specifically indicated in ROS section below Review of Systems  Objective:  BP 122/72   Pulse 67   Temp (!) 97.1 F (36.2 C) (Temporal)   Ht 5\' 3"  (1.6 m)   Wt 131 lb 2 oz (59.5 kg)   LMP  (LMP Unknown)   SpO2 98%   BMI 23.23 kg/m   Wt Readings from Last 3 Encounters:  01/17/23 131 lb 2 oz (59.5 kg)  01/01/23 133 lb 4.8 oz (60.5 kg)  10/17/22 138 lb (62.6 kg)      Physical Exam Vitals and nursing note reviewed.  Constitutional:      Appearance: Normal appearance. She is not ill-appearing.  HENT:     Mouth/Throat:     Mouth: Mucous membranes are moist.     Pharynx: Oropharynx is clear. No oropharyngeal exudate or posterior oropharyngeal erythema.  Eyes:     Extraocular Movements: Extraocular movements intact.     Pupils: Pupils are equal, round, and reactive to light.  Cardiovascular:     Rate and Rhythm: Normal rate and regular rhythm.     Pulses: Normal pulses.     Heart sounds: Normal heart sounds. No murmur heard. Pulmonary:     Effort: Pulmonary effort is normal. No  respiratory distress.     Breath sounds: Normal breath sounds. No wheezing, rhonchi or rales.  Musculoskeletal:     Right lower leg: No edema.     Left lower leg: No edema.  Skin:    General: Skin is warm and dry.     Findings: No rash.  Neurological:     Mental Status: She is alert.  Psychiatric:  Mood and Affect: Mood normal.        Behavior: Behavior normal.       Results for orders placed or performed in visit on 01/17/23  POCT glycosylated hemoglobin (Hb A1C)  Result Value Ref Range   Hemoglobin A1C 6.5 (A) 4.0 - 5.6 %   HbA1c POC (<> result, manual entry)     HbA1c, POC (prediabetic range)     HbA1c, POC (controlled diabetic range)      Assessment & Plan:   Problem List Items Addressed This Visit       Acute   Incontinence of feces    Improvement after sacral nerve stimulator placement 06/2022        Other   Immunosuppressed status (Chronic)   Type 2 diabetes mellitus with other specified complication - Primary    Chronic, stable with A1c 6.5% - continue measures to maintain diet control.       Relevant Orders   POCT glycosylated hemoglobin (Hb A1C) (Completed)   MCI (mild cognitive impairment) with memory loss    Notes ongoing difficulty. Prior MMSE earlier this year 24/30, CDT 3/4. Avoid aricept in h/o GERD/GI upset. Will trial namenda  daily with option to titrate dose slowly. Reassess at f/u visit in 3 months.       Acquired leg length discrepancy    Ongoing discomfort to hips with ambulation presumed from leg length discrepancy.  Saw sports med 2022 with 1.5cm measured difference in leg lengths (L shorter).  She didn't feel she benefited from heel lifts previously provided. She never saw orthopedist (referred 04/2022). Will offer podiatry referral to eval for orthotics.       Relevant Orders   Ambulatory referral to Podiatry   Dark stools    This is largely improved - she continues pantoprazole  daily and pepcid  nightly. She did  have + hemoccult in office last visit. She has GI appt scheduled for next month.         Meds ordered this encounter  Medications   memantine (NAMENDA) 5 MG tablet    Sig: Take 1 tablet (5 mg total) by mouth daily.    Dispense:  30 tablet    Refill:  6    Orders Placed This Encounter  Procedures   Ambulatory referral to Podiatry    Referral Priority:   Routine    Referral Type:   Consultation    Referral Reason:   Specialty Services Required    Requested Specialty:   Podiatry    Number of Visits Requested:   1   POCT glycosylated hemoglobin (Hb A1C)    Patient Instructions  Goal fasting sugar 80-120, goal 2 hours after meal <180. May try namenda  daily - this may help memory.  Ask about diabetic eye exam next time you see eye doctor. We will schedule you for one through our office.  Return in 3 months for follow up visit on memory.   Follow up plan: Return in about 3 months (around 04/18/2023) for annual exam, prior fasting for blood work.  Eustaquio Boyden, MD

## 2023-01-17 NOTE — Assessment & Plan Note (Addendum)
Chronic, stable with A1c 6.5% - continue measures to maintain diet control.

## 2023-01-17 NOTE — Assessment & Plan Note (Signed)
Improvement after sacral nerve stimulator placement 06/2022

## 2023-01-17 NOTE — Patient Instructions (Addendum)
Goal fasting sugar 80-120, goal 2 hours after meal <180. May try namenda  daily - this may help memory.  Ask about diabetic eye exam next time you see eye doctor. We will schedule you for one through our office.  Return in 3 months for follow up visit on memory.

## 2023-01-25 DIAGNOSIS — Z940 Kidney transplant status: Secondary | ICD-10-CM | POA: Diagnosis not present

## 2023-02-12 DIAGNOSIS — Z940 Kidney transplant status: Secondary | ICD-10-CM | POA: Diagnosis not present

## 2023-02-12 DIAGNOSIS — N186 End stage renal disease: Secondary | ICD-10-CM | POA: Diagnosis not present

## 2023-02-14 ENCOUNTER — Ambulatory Visit: Payer: Medicare Other | Admitting: Podiatry

## 2023-02-19 DIAGNOSIS — H401123 Primary open-angle glaucoma, left eye, severe stage: Secondary | ICD-10-CM | POA: Diagnosis not present

## 2023-02-19 DIAGNOSIS — E113292 Type 2 diabetes mellitus with mild nonproliferative diabetic retinopathy without macular edema, left eye: Secondary | ICD-10-CM | POA: Diagnosis not present

## 2023-02-19 DIAGNOSIS — Z961 Presence of intraocular lens: Secondary | ICD-10-CM | POA: Diagnosis not present

## 2023-02-23 ENCOUNTER — Encounter: Payer: Self-pay | Admitting: Physician Assistant

## 2023-02-23 ENCOUNTER — Other Ambulatory Visit (INDEPENDENT_AMBULATORY_CARE_PROVIDER_SITE_OTHER): Payer: Medicare Other

## 2023-02-23 ENCOUNTER — Ambulatory Visit: Payer: Medicare Other | Admitting: Physician Assistant

## 2023-02-23 VITALS — BP 124/78 | HR 80 | Ht 63.0 in | Wt 131.0 lb

## 2023-02-23 DIAGNOSIS — R195 Other fecal abnormalities: Secondary | ICD-10-CM

## 2023-02-23 DIAGNOSIS — R159 Full incontinence of feces: Secondary | ICD-10-CM

## 2023-02-23 LAB — CBC WITH DIFFERENTIAL/PLATELET
Basophils Absolute: 0.1 10*3/uL (ref 0.0–0.1)
Basophils Relative: 0.9 % (ref 0.0–3.0)
Eosinophils Absolute: 0.3 10*3/uL (ref 0.0–0.7)
Eosinophils Relative: 5.1 % — ABNORMAL HIGH (ref 0.0–5.0)
HCT: 40.9 % (ref 36.0–46.0)
Hemoglobin: 12.9 g/dL (ref 12.0–15.0)
Lymphocytes Relative: 14.8 % (ref 12.0–46.0)
Lymphs Abs: 0.9 10*3/uL (ref 0.7–4.0)
MCHC: 31.5 g/dL (ref 30.0–36.0)
MCV: 85 fl (ref 78.0–100.0)
Monocytes Absolute: 0.6 10*3/uL (ref 0.1–1.0)
Monocytes Relative: 9.4 % (ref 3.0–12.0)
Neutro Abs: 4.5 10*3/uL (ref 1.4–7.7)
Neutrophils Relative %: 69.8 % (ref 43.0–77.0)
Platelets: 208 10*3/uL (ref 150.0–400.0)
RBC: 4.81 Mil/uL (ref 3.87–5.11)
RDW: 14.5 % (ref 11.5–15.5)
WBC: 6.4 10*3/uL (ref 4.0–10.5)

## 2023-02-23 NOTE — Progress Notes (Signed)
Subjective:    Patient ID: Kristen Kirk, female    DOB: 1951-01-23, 72 y.o.   MRN: [id number]  HPI Kristen Kirk is a pleasant 72 year old African-American female established with Dr. Lavon Paganini.  She comes in today with concerns about dark stools.  She is also continuing to have issues with incontinence.   She has history of fecal incontinence/dyssynergic defecation and was last seen here in December 2022.  She last had colonoscopy in June 2020 which showed left-sided diverticulosis and internal hemorrhoids. After undergoing anorectal manometry etc. she was referred to surgery for consideration of placement of an InterStim device for management of her fecal incontinence.  She says she had the device placed in September 2023 per Dr. Romie Levee.  Patient says recently her stools have been looking darker than usual and she became concerned.  When asked about incontinence she says that she feels that the InterStim has helped somewhat but she still has problems with at least partial incontinence.  She says she usually wakes up every morning with stool on her pad and this may be a whole bowel movement.  Sometimes she is able to get to the bathroom for bowel movements.  She has not noticed any overt blood in her stool or any overt melanic stools. She may have taken some Pepto-Bismol within the past week. She is not currently on any iron supplementation.  No current complaints of anorectal discomfort, no abdominal pain, no nausea or indigestion. Other medical problems include hypertension, adult onset diabetes mellitus, cognitive impairment, osteoarthritis, she is status post renal transplant and is on Prograf.  Review of Systems Pertinent positive and negative review of systems were noted in the above HPI section.  All other review of systems was otherwise negative.   Outpatient Encounter Medications as of 02/23/2023  Medication Sig   alendronate (FOSAMAX) 70 MG tablet Take 1 tablet (70 mg total) by  mouth every 7 (seven) days. Take with a full glass of water on an empty stomach.   amLODipine (NORVASC) 2.5 MG tablet Take 2.5 mg by mouth daily.   apraclonidine (IOPIDINE) 0.5 % ophthalmic solution Place 1 drop into the right eye daily.   aspirin 81 MG tablet Take 81 mg by mouth daily.   Blood Glucose Monitoring Suppl (ONE TOUCH ULTRA SYSTEM KIT) W/DEVICE KIT 1 kit by Does not apply route once.   carvedilol (COREG) 25 MG tablet Take 12.5 mg by mouth 2 (two) times daily with a meal.   Cholecalciferol (VITAMIN D3) 1000 units CAPS Take 2 capsules (2,000 Units total) by mouth daily.   docusate sodium (COLACE) 100 MG capsule Take 100 mg by mouth 2 (two) times daily as needed for mild constipation or moderate constipation.   dorzolamide-timolol (COSOPT) 2-0.5 % ophthalmic solution Apply to eye.   famotidine (PEPCID) 20 MG tablet Take 1 tablet (20 mg total) by mouth at bedtime.   glucose blood test strip Check sugars once daily   hydrochlorothiazide (HYDRODIURIL) 12.5 MG tablet Take 1 tablet (12.5 mg total) by mouth daily.   ketorolac (ACULAR) 0.5 % ophthalmic solution Place 1 drop into the left eye 4 (four) times daily.   latanoprost (XALATAN) 0.005 % ophthalmic solution Place 1 drop into the right eye at bedtime.   Melatonin 3 MG CAPS Take 1-2 capsules (3-6 mg total) by mouth at bedtime as needed (sleep).   memantine (NAMENDA) 5 MG tablet Take 1 tablet (5 mg total) by mouth daily.   mirabegron ER (MYRBETRIQ) 25 MG TB24 tablet Take  25 mg by mouth daily.   Multiple Vitamin (MULTIVITAMIN) tablet Take 1 tablet by mouth daily.   mycophenolate (CELLCEPT) 500 MG tablet Take 500 mg by mouth 2 (two) times daily.   NEOMYCIN-POLYMYXIN-HYDROCORTISONE (CORTISPORIN) 1 % SOLN OTIC solution Apply 1-2 drops to toe BID after soaking   pantoprazole (PROTONIX) 20 MG tablet TAKE 1 TABLET BY MOUTH EVERY DAY   pravastatin (PRAVACHOL) 80 MG tablet Take 1 tablet (80 mg total) by mouth every evening.   prednisoLONE acetate  (PRED FORTE) 1 % ophthalmic suspension Place 1 drop into the left eye every 2 (two) hours while awake.   tacrolimus (PROGRAF) 0.5 MG capsule Take 0.5-1 mg by mouth See admin instructions. Take 2 capsules (1mg ) by mouth every morning and take 1 capsule (0.5mg ) by mouth every night   tiZANidine (ZANAFLEX) 2 MG tablet Take 1 tablet (2 mg total) by mouth 3 times/day as needed-between meals & bedtime for muscle spasms.   No facility-administered encounter medications on file as of 02/23/2023.   Allergies  Allergen Reactions   Brimonidine     Other reaction(s): Other (See Comments) eye redness   Shellfish-Derived Products Swelling   Tramadol Other (See Comments)    Dizziness even at 1/2 tab   Patient Active Problem List   Diagnosis Date Noted   Skin lesion of breast 01/02/2023   Dark stools 08/08/2022   Acute renal failure superimposed on stage 3a chronic kidney disease (HCC) 07/09/2022   Anemia in chronic kidney disease    Essential hypertension    UTI (urinary tract infection)    Type II diabetes mellitus with renal manifestations (HCC)    Immunosuppressed status (HCC) 04/15/2022   Health maintenance examination 04/14/2022   Blurry vision 04/08/2021   Constipation due to outlet dysfunction    Acquired leg length discrepancy 01/28/2021   ASCUS of cervix with negative high risk HPV 10/01/2020   Thoracic spine pain 01/05/2020   Chronic lower back pain 10/08/2019   Osteoporosis 05/03/2019   Advanced care planning/counseling discussion 03/20/2019   MCI (mild cognitive impairment) with memory loss 03/20/2019   Right shoulder pain 06/19/2017   History of renal transplant 04/01/2017   Incontinence of feces 01/11/2017   Insomnia 01/14/2013   Xerostomia 01/29/2012   Medicare annual wellness visit, subsequent 07/20/2011   Vaginal atrophy 04/17/2011   Type 2 diabetes mellitus with other specified complication (HCC)    Hyperlipidemia associated with type 2 diabetes mellitus (HCC)    GERD  (gastroesophageal reflux disease)    Mixed incontinence urge and stress    Vitamin D deficiency 05/19/2009   MUSCLE CRAMPS 02/22/2009   ALOPECIA 10/05/2008   Vertigo 07/31/2008   OSTEOARTHRITIS- EARLY T11-12, T12-L1  4/05 01/03/2007   Primary open angle glaucoma (POAG) of both eyes, severe stage 10/03/1999   Social History   Socioeconomic History   Marital status: Married    Spouse name: Not on file   Number of children: 1   Years of education: Not on file   Highest education level: Not on file  Occupational History   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  Vaping Use   Vaping Use: Never used  Substance and Sexual Activity   Alcohol use: No    Alcohol/week: 0.0  standard drinks of alcohol   Drug use: No   Sexual activity: Yes    Partners: Male  Other Topics Concern   Not on file  Social History Narrative   Lives with husband in [place].   Grown children - daughter in [place]   Occupation: Engineer, mining at [employer], 3rd shift   Activity: no regular exercise - goes to gym   Diet: good water, fruits/vegetables daily   Social Determinants of Health   Financial Resource Strain: Low Risk  (04/12/2022)   Overall Financial Resource Strain (CARDIA)    Difficulty of Paying Living Expenses: Not hard at all  Food Insecurity: No Food Insecurity (07/09/2022)   Hunger Vital Sign    Worried About Running Out of Food in the Last Year: Never true    Ran Out of Food in the Last Year: Never true  Transportation Needs: No Transportation Needs (07/09/2022)   PRAPARE - Administrator, Civil Service (Medical): No    Lack of Transportation (Non-Medical): No  Physical Activity: Insufficiently Active (04/12/2022)   Exercise Vital Sign    Days of Exercise per Week: 3  days    Minutes of Exercise per Session: 30 min  Stress: No Stress Concern Present (04/12/2022)   [employer] of Occupational Health - Occupational Stress Questionnaire    Feeling of Stress : Only a little  Social Connections: Moderately Integrated (04/12/2022)   Social Connection and Isolation Panel [NHANES]    Frequency of Communication with Friends and Family: More than three times a week    Frequency of Social Gatherings with Friends and Family: More than three times a week    Attends Religious Services: More than 4 times per year    Active Member of [employer] or Organizations: No    Attends Banker Meetings: Never    Marital Status: Married  Catering manager Violence: Not At Risk (07/09/2022)   Humiliation, Afraid, Rape, and Kick questionnaire    Fear of Current or Ex-Partner: No    Emotionally Abused: No    Physically Abused: No    Sexually Abused: No    Ms. Knipp's family history includes Diabetes in her mother; Heart failure in her maternal grandfather and mother; Hyperlipidemia in her brother, brother, sister, and sister; Hypertension in her brother, brother, sister, and sister; Other in her father; Stroke in her brother.      Objective:    Vitals:   02/23/23 1119  BP: 124/78  Pulse: 80  SpO2: 98%    Physical Exam Well-developed well-nourished older African-American female in no acute distress.  Height, Weight, 131 BMI  HEENT; nontraumatic normocephalic, EOMI, PE R LA, sclera anicteric. Oropharynx; not examined today Neck; supple, no JVD Cardiovascular; regular rate and rhythm with S1-S2, no murmur rub or gallop Pulmonary; Clear bilaterally Abdomen; soft, nontender, nondistended, no palpable mass or hepatosplenomegaly, bowel sounds are active Rectal; there is fecal seepage around the anus, stool is brown and Hemoccult negative today Skin; benign exam, no jaundice rash or appreciable lesions Extremities; no clubbing cyanosis or edema skin warm and  dry Neuro/Psych; alert and oriented x4, grossly nonfocal mood and affect appropriate        Assessment & Plan:   #47 72 year old African-American female with history of fecal incontinence, dyssynergic defecation, who underwent placement of an InterStim device in September 2023. She comes in today with concerns about dark stool, on careful questioning she had taken some Pepto-Bismol within the past week and a half. Stool is  Hemoccult negative today and brown  #2 persistent fecal incontinence, she has continued issues with fecal soilage and at times complete incontinence though says sometimes she is able to get to the bathroom for bowel movements during the day.  #3 colon cancer screening-up-to-date with last colonoscopy June 2020 with left-sided diverticulosis internal hemorrhoids and no polyps.  #4 mild cognitive impairment 5.  Immunosuppressed state on Prograf status post renal transplant 6.  Adult onset diabetes mellitus 7.  GERD stable 8.  Hypertension  Plan; patient reassured that stool was negative today. Will check CBC today Restart Benefiber at least 1 dose daily, titrate to 2 doses daily to bulk stool which may help with incontinence. She says she was told by Dr. Maurine Minister office that she should contact the [employer] rep if she was having issues with incontinence.  She will reach out to them within the next few days-perhaps settings can be adjusted. Otherwise she is advised to call and get an appointment with Dr. Maisie Fus for reassessment.  Demesha Boorman Oswald Hillock PA-C 02/23/2023   Cc: Eustaquio Boyden, MD

## 2023-02-23 NOTE — Patient Instructions (Addendum)
Your provider has requested that you go to the basement level for lab work before leaving today. Press "B" on the elevator. The lab is located at the first door on the left as you exit the elevator.   Take Benefiber one dose daily  Due to recent changes in healthcare laws, you may see the results of your imaging and laboratory studies on MyChart before your provider has had a chance to review them.  We understand that in some cases there may be results that are confusing or concerning to you. Not all laboratory results come back in the same time frame and the provider may be waiting for multiple results in order to interpret others.  Please give [place] 48 hours in order for your provider to thoroughly review all the results before contacting the office for clarification of your results.    I appreciate the  opportunity to care for you  Thank You   Amy Myrtie Hawk

## 2023-03-09 ENCOUNTER — Telehealth: Payer: Self-pay | Admitting: Physician Assistant

## 2023-03-09 NOTE — Telephone Encounter (Signed)
Inbound call from patient returning phone call regarding recent lab results. Requesting a call back to discuss. Please advise, thank you.

## 2023-03-13 NOTE — Telephone Encounter (Signed)
Please call pt and let her know her hgb is normal at 12.9  which is good - there was concern about bleeding but she was heme negative  when I saw her    Attempted to reach the pt- line rings then goes busy

## 2023-03-15 DIAGNOSIS — D84821 Immunodeficiency due to drugs: Secondary | ICD-10-CM | POA: Diagnosis not present

## 2023-03-16 ENCOUNTER — Ambulatory Visit: Payer: Medicare Other | Admitting: Primary Care

## 2023-03-21 ENCOUNTER — Ambulatory Visit (INDEPENDENT_AMBULATORY_CARE_PROVIDER_SITE_OTHER): Payer: Medicare Other | Admitting: Family Medicine

## 2023-03-21 ENCOUNTER — Encounter: Payer: Self-pay | Admitting: Family Medicine

## 2023-03-21 VITALS — BP 126/70 | HR 72 | Temp 97.9°F | Ht 63.0 in | Wt 132.5 lb

## 2023-03-21 DIAGNOSIS — R350 Frequency of micturition: Secondary | ICD-10-CM

## 2023-03-21 DIAGNOSIS — M5442 Lumbago with sciatica, left side: Secondary | ICD-10-CM

## 2023-03-21 DIAGNOSIS — G8929 Other chronic pain: Secondary | ICD-10-CM

## 2023-03-21 DIAGNOSIS — N3000 Acute cystitis without hematuria: Secondary | ICD-10-CM

## 2023-03-21 DIAGNOSIS — E1169 Type 2 diabetes mellitus with other specified complication: Secondary | ICD-10-CM

## 2023-03-21 DIAGNOSIS — Z940 Kidney transplant status: Secondary | ICD-10-CM

## 2023-03-21 DIAGNOSIS — D849 Immunodeficiency, unspecified: Secondary | ICD-10-CM

## 2023-03-21 LAB — POC URINALSYSI DIPSTICK (AUTOMATED)
Bilirubin, UA: NEGATIVE
Blood, UA: NEGATIVE
Glucose, UA: NEGATIVE
Ketones, UA: NEGATIVE
Nitrite, UA: NEGATIVE
Protein, UA: NEGATIVE
Spec Grav, UA: >=1.03 — AB (ref 1.010–1.025)
Urobilinogen, UA: 0.2 E.U./dL
pH, UA: 5 (ref 5.0–8.0)

## 2023-03-21 MED ORDER — CEPHALEXIN 500 MG PO CAPS: 500.0000 mg | ORAL_CAPSULE | Freq: Two times a day (BID) | ORAL | 0 refills | Status: DC

## 2023-03-21 NOTE — Progress Notes (Addendum)
Ph: [phone] Fax: [phone]   Patient ID: Kristen Kirk, female    DOB: [birth year], 72 y.o.   MRN: [id number]  This visit was conducted in person.  BP 126/70   Pulse 72   Temp 97.9 F (36.6 C) (Temporal)   Ht 5\' 3"  (1.6 m)   Wt 132 lb 8 oz (60.1 kg)   LMP  (LMP Unknown)   SpO2 98%   BMI 23.47 kg/m    CC: dysuria Subjective:   HPI: Kristen Kirk is a 72 y.o. female presenting on 03/21/2023 for Urinary Frequency (C/o urinary frequency, foul urine odor and low L-side back pain. Sxs started about 1 wk ago. )   H/o kidney transplant 04/2017 on prograf and mycophenolate.   1 wk h/o foul smelling urine, urine frequency, urgency, left low back pain.   No fevers/chills, nausea/vomiting, flank pain, dysuria, hematuria.   No h/o kidney stone.  Last UTI 07/2022 - klebsiella, s/p hospitalization.   Continues myrbetriq 25mg  daily for urinary incontinence.   2 weeks of left lower back pain. She's taking tylenol 500mg  TID.  Known h/o lumbar herniated discs s/p L5/S1 TF ESI x1 (04/2020).   Recent trip to [facility] for [employer] graduation.      Relevant past medical, surgical, family and social history reviewed and updated as indicated. Interim medical history since our last visit reviewed. Allergies and medications reviewed and updated. Outpatient Medications Prior to Visit  Medication Sig Dispense Refill   acetaminophen (TYLENOL) 500 MG tablet Take 1 tablet (500 mg total) by mouth 3 (three) times daily as needed for moderate pain.     alendronate (FOSAMAX) 70 MG tablet Take 1 tablet (70 mg total) by mouth every 7 (seven) days. Take with a full glass of water on an empty stomach.     amLODipine (NORVASC) 2.5 MG tablet Take 2.5 mg by mouth daily.     apraclonidine (IOPIDINE) 0.5 % ophthalmic solution Place 1 drop into the right eye daily.     aspirin 81 MG tablet Take 81 mg by mouth daily.     Blood Glucose Monitoring Suppl (ONE TOUCH ULTRA SYSTEM KIT) W/DEVICE KIT 1  kit by Does not apply route once. 1 each 0   carvedilol (COREG) 25 MG tablet Take 12.5 mg by mouth 2 (two) times daily with a meal.     Cholecalciferol (VITAMIN D3) 1000 units CAPS Take 2 capsules (2,000 Units total) by mouth daily. 60 capsule    docusate sodium (COLACE) 100 MG capsule Take 100 mg by mouth 2 (two) times daily as needed for mild constipation or moderate constipation.  0   dorzolamide-timolol (COSOPT) 2-0.5 % ophthalmic solution Apply to eye.     famotidine (PEPCID) 20 MG tablet Take 1 tablet (20 mg total) by mouth at bedtime. 30 tablet 2   glucose blood test strip Check sugars once daily 100 each 11   hydrochlorothiazide (HYDRODIURIL) 12.5 MG tablet Take 1 tablet (12.5 mg total) by mouth daily. 90 tablet 3   ketorolac (ACULAR) 0.5 % ophthalmic solution Place 1 drop into the left eye 4 (four) times daily.     latanoprost (XALATAN) 0.005 % ophthalmic solution Place 1 drop into the right eye at bedtime.     Melatonin 3 MG CAPS Take 1-2 capsules (3-6 mg total) by mouth at bedtime as needed (sleep).  0   memantine (NAMENDA) 5 MG tablet Take 1 tablet (5 mg total) by mouth daily. 30 tablet 6   mirabegron  ER (MYRBETRIQ) 25 MG TB24 tablet Take 25 mg by mouth daily.     Multiple Vitamin (MULTIVITAMIN) tablet Take 1 tablet by mouth daily.     mycophenolate (CELLCEPT) 500 MG tablet Take 500 mg by mouth 2 (two) times daily.     NEOMYCIN-POLYMYXIN-HYDROCORTISONE (CORTISPORIN) 1 % SOLN OTIC solution Apply 1-2 drops to toe BID after soaking 10 mL 1   pantoprazole (PROTONIX) 20 MG tablet TAKE 1 TABLET BY MOUTH EVERY DAY 90 tablet 3   pravastatin (PRAVACHOL) 80 MG tablet Take 1 tablet (80 mg total) by mouth every evening. 90 tablet 3   prednisoLONE acetate (PRED FORTE) 1 % ophthalmic suspension Place 1 drop into the left eye every 2 (two) hours while awake.     tacrolimus (PROGRAF) 0.5 MG capsule Take 0.5-1 mg by mouth See admin instructions. Take 2 capsules (1mg ) by mouth every morning and take 1  capsule (0.5mg ) by mouth every night     tiZANidine (ZANAFLEX) 2 MG tablet Take 1 tablet (2 mg total) by mouth 3 times/day as needed-between meals & bedtime for muscle spasms. 10 tablet 0   No facility-administered medications prior to visit.     Per HPI unless specifically indicated in ROS section below Review of Systems  Objective:  BP 126/70   Pulse 72   Temp 97.9 F (36.6 C) (Temporal)   Ht 5\' 3"  (1.6 m)   Wt 132 lb 8 oz (60.1 kg)   LMP  (LMP Unknown)   SpO2 98%   BMI 23.47 kg/m   Wt Readings from Last 3 Encounters:  03/21/23 132 lb 8 oz (60.1 kg)  02/23/23 131 lb (59.4 kg)  01/17/23 131 lb 2 oz (59.5 kg)      Physical Exam Vitals and nursing note reviewed.  Constitutional:      Appearance: Normal appearance. She is not ill-appearing.  HENT:     Mouth/Throat:     Mouth: Mucous membranes are moist.     Pharynx: Oropharynx is clear. No oropharyngeal exudate or posterior oropharyngeal erythema.  Eyes:     Extraocular Movements: Extraocular movements intact.     Pupils: Pupils are equal, round, and reactive to light.  Cardiovascular:     Rate and Rhythm: Normal rate and regular rhythm.     Pulses: Normal pulses.     Heart sounds: Normal heart sounds. No murmur heard. Pulmonary:     Effort: Pulmonary effort is normal. No respiratory distress.     Breath sounds: Normal breath sounds. No wheezing, rhonchi or rales.  Abdominal:     General: Bowel sounds are normal. There is no distension.     Palpations: There is no hepatomegaly, splenomegaly or mass.     Tenderness: There is abdominal tenderness (mild) in the right lower quadrant. There is no right CVA tenderness, left CVA tenderness, guarding or rebound. Negative signs include Murphy's sign.     Hernia: No hernia is present.  Musculoskeletal:     Right lower leg: No edema.     Left lower leg: No edema.     Comments:  No pain midline spine No paraspinous mm tenderness Neg SLR bilaterally. No pain with int/ext  rotation at hip. No pain at SIJ, GTB or sciatic notch bilaterally.  Sacral nerve stimulator in place to R buttock  Skin:    General: Skin is warm and dry.  Neurological:     Mental Status: She is alert.  Psychiatric:        Mood and Affect: Mood  normal.        Behavior: Behavior normal.       Results for orders placed or performed in visit on 03/21/23  POCT Urinalysis Dipstick (Automated)  Result Value Ref Range   Color, UA yellow    Clarity, UA clear    Glucose, UA Negative Negative   Bilirubin, UA negative    Ketones, UA negtive    Spec Grav, UA >=1.030 (A) 1.010 - 1.025   Blood, UA negative    pH, UA 5.0 5.0 - 8.0   Protein, UA Negative Negative   Urobilinogen, UA 0.2 0.2 or 1.0 E.U./dL   Nitrite, UA negative    Leukocytes, UA Small (1+) (A) Negative   Lab Results  Component Value Date   CREATININE 1.14 01/01/2023   BUN 30 (H) 01/01/2023   NA 139 01/01/2023   K 4.1 01/01/2023   CL 107 01/01/2023   CO2 25 01/01/2023   GFR = 48.8  Assessment & Plan:   Problem List Items Addressed This Visit     Immunosuppressed status (HCC) (Chronic)   Type 2 diabetes mellitus with other specified complication (HCC)    Diabetic, diet controlled.       History of renal transplant   Chronic lower back pain    H/o lumbar DDD and herniated discs, s/p TF ESI 2021 with benefit (Dr Yves Dill). Anticipate current left low back pain is exacerbation of known lumbar issues. Supportive measures reviewed - heating pad, ice, tylenol. Reassess discomfort after treating UTI below.       Relevant Medications   acetaminophen (TYLENOL) 500 MG tablet   UTI (urinary tract infection) - Primary    UA/micro suspicious for infection. Send culture. In interim, start treatment with keflex 500mg  BID 7d course. Update if not improving with treatment.       Relevant Medications   cephALEXin (KEFLEX) 500 MG capsule   Other Visit Diagnoses     Urinary frequency       Relevant Orders   POCT Urinalysis  Dipstick (Automated) (Completed)   Urine Culture        Meds ordered this encounter  Medications   cephALEXin (KEFLEX) 500 MG capsule    Sig: Take 1 capsule (500 mg total) by mouth 2 (two) times daily.    Dispense:  14 capsule    Refill:  0    Orders Placed This Encounter  Procedures   Urine Culture   POCT Urinalysis Dipstick (Automated)    Patient Instructions  Urine suspicious for infection. Treat with keflex 500mg  twice daily for 1 week. Let us know if ongoing symptoms after treatment.  Urine culture sent.  May use heat, tylenol as needed for back pain. Let us know if ongoing left low back pain after UTI treatment.   Follow up plan: Return if symptoms worsen or fail to improve.  Eustaquio Boyden, MD

## 2023-03-21 NOTE — Addendum Note (Signed)
Addended by: Eustaquio Boyden on: 03/21/2023 03:01 PM   Modules accepted: Orders

## 2023-03-21 NOTE — Assessment & Plan Note (Signed)
Diabetic, diet controlled.

## 2023-03-21 NOTE — Patient Instructions (Signed)
Urine suspicious for infection. Treat with keflex 500mg  twice daily for 1 week. Let us know if ongoing symptoms after treatment.  Urine culture sent.  May use heat, tylenol as needed for back pain. Let us know if ongoing left low back pain after UTI treatment.

## 2023-03-21 NOTE — Assessment & Plan Note (Signed)
H/o lumbar DDD and herniated discs, s/p TF ESI 2021 with benefit (Dr Yves Dill). Anticipate current left low back pain is exacerbation of known lumbar issues. Supportive measures reviewed - heating pad, ice, tylenol. Reassess discomfort after treating UTI below.

## 2023-03-21 NOTE — Assessment & Plan Note (Signed)
UA/micro suspicious for infection. Send culture. In interim, start treatment with keflex 500mg  BID 7d course. Update if not improving with treatment.

## 2023-03-23 LAB — URINE CULTURE
MICRO NUMBER:: 15102182
SPECIMEN QUALITY:: ADEQUATE

## 2023-04-06 ENCOUNTER — Other Ambulatory Visit: Payer: Self-pay | Admitting: Family Medicine

## 2023-04-06 DIAGNOSIS — M791 Myalgia, unspecified site: Secondary | ICD-10-CM

## 2023-04-06 NOTE — Telephone Encounter (Signed)
Prescription Request  04/06/2023  LOV: 03/21/2023  What is the name of the medication or equipment? tiZANidine (ZANAFLEX) 2 MG tablet   Have you contacted your pharmacy to request a refill? No   Which pharmacy would you like this sent to?  [facility] [address] [place] - [address] [place] [address] Phone: [phone] Fax: [phone]    Patient notified that their request is being sent to the clinical staff for review and that they should receive a response within 2 business days.   Please advise at [facility] [phone]  Patient state she has been having some pain in her neck and requested a refill of this to help

## 2023-04-06 NOTE — Telephone Encounter (Signed)
Tizanidine Last rx:  08/08/22, #10 Last OV:  03/21/23, acute cystitis Next OV:  04/18/23, CPE

## 2023-04-09 ENCOUNTER — Other Ambulatory Visit: Payer: Self-pay | Admitting: Family Medicine

## 2023-04-09 DIAGNOSIS — E559 Vitamin D deficiency, unspecified: Secondary | ICD-10-CM

## 2023-04-09 DIAGNOSIS — E1169 Type 2 diabetes mellitus with other specified complication: Secondary | ICD-10-CM

## 2023-04-09 MED ORDER — TIZANIDINE HCL 2 MG PO TABS
2.0000 mg | ORAL_TABLET | Freq: Two times a day (BID) | ORAL | 0 refills | Status: DC | PRN
Start: 2023-04-09 — End: 2024-01-09

## 2023-04-11 ENCOUNTER — Other Ambulatory Visit: Payer: Medicare Other

## 2023-04-18 ENCOUNTER — Encounter: Payer: Medicare Other | Admitting: Family Medicine

## 2023-04-19 ENCOUNTER — Encounter: Payer: Self-pay | Admitting: Family Medicine

## 2023-05-01 DIAGNOSIS — Z940 Kidney transplant status: Secondary | ICD-10-CM | POA: Diagnosis not present

## 2023-05-01 DIAGNOSIS — N186 End stage renal disease: Secondary | ICD-10-CM | POA: Diagnosis not present

## 2023-05-02 ENCOUNTER — Ambulatory Visit: Payer: Medicare Other | Admitting: Family Medicine

## 2023-05-02 ENCOUNTER — Telehealth: Payer: Self-pay

## 2023-05-02 NOTE — Telephone Encounter (Signed)
Noted  

## 2023-05-02 NOTE — Telephone Encounter (Signed)
Attempted to contact pt. No answer. No vm.  Needs to r/s today's OV for L hip pain.

## 2023-05-02 NOTE — Telephone Encounter (Addendum)
See note below access note.. I spoke with pt and she has already rescheduled appt on 05/08/23 at 8 AM eith Dr Reece Agar. Pt said nothing further needed and pt will cb sooner if needed. UCV & ED precautions also given.

## 2023-05-03 DIAGNOSIS — N3946 Mixed incontinence: Secondary | ICD-10-CM | POA: Diagnosis not present

## 2023-05-03 DIAGNOSIS — R350 Frequency of micturition: Secondary | ICD-10-CM | POA: Diagnosis not present

## 2023-05-08 ENCOUNTER — Ambulatory Visit: Payer: Medicare Other | Admitting: Family Medicine

## 2023-05-09 ENCOUNTER — Encounter: Payer: Self-pay | Admitting: Family Medicine

## 2023-05-10 DIAGNOSIS — H35352 Cystoid macular degeneration, left eye: Secondary | ICD-10-CM | POA: Diagnosis not present

## 2023-05-10 DIAGNOSIS — H401123 Primary open-angle glaucoma, left eye, severe stage: Secondary | ICD-10-CM | POA: Diagnosis not present

## 2023-05-10 DIAGNOSIS — Z961 Presence of intraocular lens: Secondary | ICD-10-CM | POA: Diagnosis not present

## 2023-05-15 ENCOUNTER — Ambulatory Visit (INDEPENDENT_AMBULATORY_CARE_PROVIDER_SITE_OTHER)
Admission: RE | Admit: 2023-05-15 | Discharge: 2023-05-15 | Disposition: A | Discharge status: Home / Self Care | Payer: Medicare Other | Source: Ambulatory Visit | Attending: Family Medicine | Admitting: Family Medicine

## 2023-05-15 ENCOUNTER — Ambulatory Visit (INDEPENDENT_AMBULATORY_CARE_PROVIDER_SITE_OTHER): Payer: Medicare Other | Admitting: Family Medicine

## 2023-05-15 ENCOUNTER — Encounter: Payer: Self-pay | Admitting: Family Medicine

## 2023-05-15 VITALS — BP 132/74 | HR 65 | Temp 97.4°F | Ht 63.0 in | Wt 132.5 lb

## 2023-05-15 DIAGNOSIS — M5442 Lumbago with sciatica, left side: Secondary | ICD-10-CM

## 2023-05-15 DIAGNOSIS — M5430 Sciatica, unspecified side: Secondary | ICD-10-CM | POA: Diagnosis not present

## 2023-05-15 DIAGNOSIS — Z940 Kidney transplant status: Secondary | ICD-10-CM | POA: Diagnosis not present

## 2023-05-15 DIAGNOSIS — M4316 Spondylolisthesis, lumbar region: Secondary | ICD-10-CM | POA: Diagnosis not present

## 2023-05-15 DIAGNOSIS — D849 Immunodeficiency, unspecified: Secondary | ICD-10-CM

## 2023-05-15 DIAGNOSIS — G8929 Other chronic pain: Secondary | ICD-10-CM

## 2023-05-15 DIAGNOSIS — M47816 Spondylosis without myelopathy or radiculopathy, lumbar region: Secondary | ICD-10-CM | POA: Diagnosis not present

## 2023-05-15 DIAGNOSIS — M549 Dorsalgia, unspecified: Secondary | ICD-10-CM | POA: Diagnosis not present

## 2023-05-15 NOTE — Patient Instructions (Addendum)
Double check with kidney doctors if you should be on fosamax (alendronate) for bone strengthening (osteoporosis).  Repeat lumbar xrays today I think pain is coming from history of bulging and herniated discs (noted on MRI 2021). We will refer you back to the physiatrist for further evaluation and possible repeat shot.

## 2023-05-15 NOTE — Progress Notes (Signed)
Ph: [phone] Fax: [phone]   Patient ID: Kristen Kirk, female    DOB: 10/04/1950, 72 y.o.   MRN: [id number]  This visit was conducted in person.  BP 132/74   Pulse 65   Temp (!) 97.4 F (36.3 C) (Temporal)   Ht 5\' 3"  (1.6 m)   Wt 132 lb 8 oz (60.1 kg)   LMP  (LMP Unknown)   SpO2 99%   BMI 23.47 kg/m    CC: Left hip pain  Subjective:   HPI: Kristen Kirk is a 72 y.o. female presenting on 05/15/2023 for Hip Pain (C/o L hip pain. Started wks ago, worsening. Denies injury.  )   Caring for sick husband - with heart disease s/p recent pacemaker change.   1+ month of L lower back pain that started without inciting trauma/injury. Points to posterior left hip. Describes pain worse with walking. Radiation of pain down groin to posterior knee.  At its worse 8/10. Worse when first getting up from prolonged sitting, also painful with prolonged walking.  Treating with tylenol 500mg  TID PRN, limited relief.  Tried heat with limited benefit.   Known h/o lumbar DDD and herniated discs s/p L5/S1 TF ESI x1 (04/2020) by Dr Yves Dill.   H/o kidney transplant on cellcept, prograf.   She states she's not taking fosamax - prescribed by kidney transplant clinic. Will check with transplant clinic - upcoming appt next week     Relevant past medical, surgical, family and social history reviewed and updated as indicated. Interim medical history since our last visit reviewed. Allergies and medications reviewed and updated. Outpatient Medications Prior to Visit  Medication Sig Dispense Refill   acetaminophen (TYLENOL) 500 MG tablet Take 1 tablet (500 mg total) by mouth 3 (three) times daily as needed for moderate pain.     alendronate (FOSAMAX) 70 MG tablet Take 1 tablet (70 mg total) by mouth every 7 (seven) days. Take with a full glass of water on an empty stomach.     amLODipine (NORVASC) 2.5 MG tablet Take 2.5 mg by mouth daily.     apraclonidine (IOPIDINE) 0.5 % ophthalmic  solution Place 1 drop into the right eye daily.     aspirin 81 MG tablet Take 81 mg by mouth daily.     Blood Glucose Monitoring Suppl (ONE TOUCH ULTRA SYSTEM KIT) W/DEVICE KIT 1 kit by Does not apply route once. 1 each 0   carvedilol (COREG) 25 MG tablet Take 12.5 mg by mouth 2 (two) times daily with a meal.     Cholecalciferol (VITAMIN D3) 1000 units CAPS Take 2 capsules (2,000 Units total) by mouth daily. 60 capsule    docusate sodium (COLACE) 100 MG capsule Take 100 mg by mouth 2 (two) times daily as needed for mild constipation or moderate constipation.  0   dorzolamide-timolol (COSOPT) 2-0.5 % ophthalmic solution Apply to eye.     famotidine (PEPCID) 20 MG tablet Take 1 tablet (20 mg total) by mouth at bedtime. 30 tablet 2   glucose blood test strip Check sugars once daily 100 each 11   hydrochlorothiazide (HYDRODIURIL) 12.5 MG tablet Take 1 tablet (12.5 mg total) by mouth daily. 90 tablet 3   ketorolac (ACULAR) 0.5 % ophthalmic solution Place 1 drop into the left eye 4 (four) times daily.     latanoprost (XALATAN) 0.005 % ophthalmic solution Place 1 drop into the right eye at bedtime.     Melatonin 3 MG CAPS Take 1-2 capsules (3-6  mg total) by mouth at bedtime as needed (sleep).  0   memantine (NAMENDA) 5 MG tablet Take 1 tablet (5 mg total) by mouth daily. 30 tablet 6   Multiple Vitamin (MULTIVITAMIN) tablet Take 1 tablet by mouth daily.     mycophenolate (CELLCEPT) 500 MG tablet Take 500 mg by mouth 2 (two) times daily.     NEOMYCIN-POLYMYXIN-HYDROCORTISONE (CORTISPORIN) 1 % SOLN OTIC solution Apply 1-2 drops to toe BID after soaking 10 mL 1   pantoprazole (PROTONIX) 20 MG tablet TAKE 1 TABLET BY MOUTH EVERY DAY 90 tablet 3   pravastatin (PRAVACHOL) 80 MG tablet Take 1 tablet (80 mg total) by mouth every evening. 90 tablet 3   prednisoLONE acetate (PRED FORTE) 1 % ophthalmic suspension Place 1 drop into the left eye every 2 (two) hours while awake.     tacrolimus (PROGRAF) 0.5 MG capsule  Take 0.5-1 mg by mouth See admin instructions. Take 2 capsules (1mg ) by mouth every morning and take 1 capsule (0.5mg ) by mouth every night     tiZANidine (ZANAFLEX) 2 MG tablet Take 1 tablet (2 mg total) by mouth 3 times/day as needed-between meals & bedtime for muscle spasms. 10 tablet 0   cephALEXin (KEFLEX) 500 MG capsule Take 1 capsule (500 mg total) by mouth 2 (two) times daily. 14 capsule 0   mirabegron ER (MYRBETRIQ) 25 MG TB24 tablet Take 25 mg by mouth daily.     No facility-administered medications prior to visit.     Per HPI unless specifically indicated in ROS section below Review of Systems  Objective:  BP 132/74   Pulse 65   Temp (!) 97.4 F (36.3 C) (Temporal)   Ht 5\' 3"  (1.6 m)   Wt 132 lb 8 oz (60.1 kg)   LMP  (LMP Unknown)   SpO2 99%   BMI 23.47 kg/m   Wt Readings from Last 3 Encounters:  05/15/23 132 lb 8 oz (60.1 kg)  03/21/23 132 lb 8 oz (60.1 kg)  02/23/23 131 lb (59.4 kg)      Physical Exam Vitals and nursing note reviewed.  Constitutional:      Appearance: Normal appearance.  Musculoskeletal:        General: Normal range of motion.     Right lower leg: No edema.     Left lower leg: No edema.     Comments:  No pain midline spine No paraspinous mm tenderness Neg SLR bilaterally. No pain with int/ext rotation at hip. Neg FABER. No pain at SIJ, GTB bilaterally. Reproducible discomfort to palpation at L sciatic notch   Skin:    General: Skin is warm and dry.     Findings: No rash.  Neurological:     Mental Status: She is alert.     Motor: Motor function is intact.     Coordination: Coordination is intact.     Gait: Gait is intact.     Comments:  5/5 BLE strength Diminished DTRs BLE symmetrically  Psychiatric:        Mood and Affect: Mood normal.        Behavior: Behavior normal.        Assessment & Plan:   Problem List Items Addressed This Visit     History of renal transplant (Chronic)    Continues prograf and celcept followed by  [facility] transplant clinic.       Immunosuppressed status (HCC) (Chronic)   Chronic lower back pain - Primary    Story /exam suspicious for  L sciatica in h/o known herniated discs on MRI 01/2020. Previously significantly benefited from L L5/S1 TF ESI 04/2020.  Will update lumbar films and refer back to PM&R.  Continue tylenol. Avoid NSAIDs in kidney transplant history. Consider steroid course.       Relevant Orders   DG Lumbar Spine Complete   Ambulatory referral to Physical Medicine Rehab     No orders of the defined types were placed in this encounter.   Orders Placed This Encounter  Procedures   DG Lumbar Spine Complete    Standing Status:   Future    Number of Occurrences:   1    Standing Expiration Date:   05/14/2024    Order Specific Question:   Reason for Exam (SYMPTOM  OR DIAGNOSIS REQUIRED)    Answer:   L sciatica, chronic back pain    Order Specific Question:   Preferred imaging location?    Answer:   [facility]   Ambulatory referral to Physical Medicine Rehab    Referral Priority:   Routine    Referral Type:   Rehabilitation    Referral Reason:   Specialty Services Required    Requested Specialty:   Physical Medicine and Rehabilitation    Number of Visits Requested:   1    Patient Instructions  Double check with kidney doctors if you should be on fosamax (alendronate) for bone strengthening (osteoporosis).  Repeat lumbar xrays today I think pain is coming from history of bulging and herniated discs (noted on MRI 2021). We will refer you back to the physiatrist for further evaluation and possible repeat shot.   Follow up plan: Return if symptoms worsen or fail to improve.  Eustaquio Boyden, MD

## 2023-05-15 NOTE — Assessment & Plan Note (Signed)
Continues prograf and celcept followed by [facility] transplant clinic.

## 2023-05-15 NOTE — Assessment & Plan Note (Addendum)
Story /exam suspicious for L sciatica in h/o known herniated discs on MRI 01/2020. Previously significantly benefited from L L5/S1 TF ESI 04/2020.  Will update lumbar films and refer back to PM&R.  Continue tylenol. Avoid NSAIDs in kidney transplant history. Consider steroid course.

## 2023-05-18 DIAGNOSIS — R42 Dizziness and giddiness: Secondary | ICD-10-CM | POA: Diagnosis not present

## 2023-05-18 DIAGNOSIS — H90A21 Sensorineural hearing loss, unilateral, right ear, with restricted hearing on the contralateral side: Secondary | ICD-10-CM | POA: Diagnosis not present

## 2023-05-18 DIAGNOSIS — H8109 Meniere's disease, unspecified ear: Secondary | ICD-10-CM | POA: Diagnosis not present

## 2023-05-23 DIAGNOSIS — M5416 Radiculopathy, lumbar region: Secondary | ICD-10-CM | POA: Diagnosis not present

## 2023-05-23 DIAGNOSIS — M4807 Spinal stenosis, lumbosacral region: Secondary | ICD-10-CM | POA: Diagnosis not present

## 2023-05-23 DIAGNOSIS — M4310 Spondylolisthesis, site unspecified: Secondary | ICD-10-CM | POA: Diagnosis not present

## 2023-05-23 DIAGNOSIS — M5136 Other intervertebral disc degeneration, lumbar region: Secondary | ICD-10-CM | POA: Diagnosis not present

## 2023-05-25 ENCOUNTER — Other Ambulatory Visit: Payer: Self-pay | Admitting: Family Medicine

## 2023-05-25 DIAGNOSIS — N186 End stage renal disease: Secondary | ICD-10-CM | POA: Diagnosis not present

## 2023-05-25 DIAGNOSIS — Z79899 Other long term (current) drug therapy: Secondary | ICD-10-CM | POA: Diagnosis not present

## 2023-05-25 DIAGNOSIS — M5416 Radiculopathy, lumbar region: Secondary | ICD-10-CM

## 2023-05-25 DIAGNOSIS — R7989 Other specified abnormal findings of blood chemistry: Secondary | ICD-10-CM | POA: Diagnosis not present

## 2023-05-25 DIAGNOSIS — M4807 Spinal stenosis, lumbosacral region: Secondary | ICD-10-CM

## 2023-05-25 DIAGNOSIS — Z940 Kidney transplant status: Secondary | ICD-10-CM | POA: Diagnosis not present

## 2023-05-28 DIAGNOSIS — R42 Dizziness and giddiness: Secondary | ICD-10-CM | POA: Diagnosis not present

## 2023-05-28 DIAGNOSIS — H90A21 Sensorineural hearing loss, unilateral, right ear, with restricted hearing on the contralateral side: Secondary | ICD-10-CM | POA: Diagnosis not present

## 2023-05-29 ENCOUNTER — Other Ambulatory Visit: Payer: Self-pay | Admitting: Otolaryngology

## 2023-05-29 DIAGNOSIS — R42 Dizziness and giddiness: Secondary | ICD-10-CM

## 2023-05-29 DIAGNOSIS — H90A21 Sensorineural hearing loss, unilateral, right ear, with restricted hearing on the contralateral side: Secondary | ICD-10-CM

## 2023-05-30 DIAGNOSIS — N186 End stage renal disease: Secondary | ICD-10-CM | POA: Diagnosis not present

## 2023-05-30 DIAGNOSIS — Z940 Kidney transplant status: Secondary | ICD-10-CM | POA: Diagnosis not present

## 2023-05-30 DIAGNOSIS — R7989 Other specified abnormal findings of blood chemistry: Secondary | ICD-10-CM | POA: Diagnosis not present

## 2023-06-05 DIAGNOSIS — M5416 Radiculopathy, lumbar region: Secondary | ICD-10-CM | POA: Diagnosis not present

## 2023-06-05 DIAGNOSIS — M48062 Spinal stenosis, lumbar region with neurogenic claudication: Secondary | ICD-10-CM | POA: Diagnosis not present

## 2023-06-07 ENCOUNTER — Ambulatory Visit
Admission: RE | Admit: 2023-06-07 | Discharge: 2023-06-07 | Disposition: A | Discharge status: Home / Self Care | Payer: Medicare Other | Source: Ambulatory Visit | Attending: Otolaryngology | Admitting: Otolaryngology

## 2023-06-07 ENCOUNTER — Ambulatory Visit
Admission: RE | Admit: 2023-06-07 | Discharge: 2023-06-07 | Disposition: A | Discharge status: Home / Self Care | Payer: Medicare Other | Source: Ambulatory Visit | Attending: Family Medicine | Admitting: Family Medicine

## 2023-06-07 DIAGNOSIS — R42 Dizziness and giddiness: Secondary | ICD-10-CM | POA: Diagnosis not present

## 2023-06-07 DIAGNOSIS — N3946 Mixed incontinence: Secondary | ICD-10-CM | POA: Diagnosis not present

## 2023-06-07 DIAGNOSIS — M4726 Other spondylosis with radiculopathy, lumbar region: Secondary | ICD-10-CM | POA: Diagnosis not present

## 2023-06-07 DIAGNOSIS — M5416 Radiculopathy, lumbar region: Secondary | ICD-10-CM

## 2023-06-07 DIAGNOSIS — H90A21 Sensorineural hearing loss, unilateral, right ear, with restricted hearing on the contralateral side: Secondary | ICD-10-CM

## 2023-06-07 DIAGNOSIS — R351 Nocturia: Secondary | ICD-10-CM | POA: Diagnosis not present

## 2023-06-07 DIAGNOSIS — M4807 Spinal stenosis, lumbosacral region: Secondary | ICD-10-CM

## 2023-06-07 DIAGNOSIS — I700 Atherosclerosis of aorta: Secondary | ICD-10-CM | POA: Diagnosis not present

## 2023-06-07 MED ORDER — IOPAMIDOL (ISOVUE-300) INJECTION 61%
75.0000 mL | Freq: Once | INTRAVENOUS | Status: AC | PRN
  Administered 2023-06-07: 75 mL via INTRAVENOUS

## 2023-06-11 DIAGNOSIS — K080 Exfoliation of teeth due to systemic causes: Secondary | ICD-10-CM | POA: Diagnosis not present

## 2023-06-19 DIAGNOSIS — K080 Exfoliation of teeth due to systemic causes: Secondary | ICD-10-CM | POA: Diagnosis not present

## 2023-06-26 DIAGNOSIS — M4310 Spondylolisthesis, site unspecified: Secondary | ICD-10-CM | POA: Diagnosis not present

## 2023-06-26 DIAGNOSIS — M48062 Spinal stenosis, lumbar region with neurogenic claudication: Secondary | ICD-10-CM | POA: Diagnosis not present

## 2023-06-26 DIAGNOSIS — M5136 Other intervertebral disc degeneration, lumbar region: Secondary | ICD-10-CM | POA: Diagnosis not present

## 2023-06-26 DIAGNOSIS — M5416 Radiculopathy, lumbar region: Secondary | ICD-10-CM | POA: Diagnosis not present

## 2023-06-27 ENCOUNTER — Other Ambulatory Visit: Payer: Self-pay

## 2023-06-27 ENCOUNTER — Inpatient Hospital Stay
Admission: RE | Admit: 2023-06-27 | Discharge: 2023-06-27 | Disposition: A | Discharge status: Home / Self Care | Payer: Self-pay | Source: Ambulatory Visit | Attending: Orthopedic Surgery | Admitting: Orthopedic Surgery

## 2023-06-27 DIAGNOSIS — Z049 Encounter for examination and observation for unspecified reason: Secondary | ICD-10-CM

## 2023-06-29 NOTE — Progress Notes (Unsigned)
Referring Physician:  Denton Lank, FNP [id number] [place],  [place] [address]  Primary Physician:  Eustaquio Boyden, MD  History of Present Illness: 06/29/2023*** Kristen Kirk has a history of renal transplant, HTN, hyperlipidemia, glaucoma, CKD, DM, osteoporosis.    She has sacral stimulator.      No NSAIDs due to h/p renal transplant and CKD.   Duration: *** Location: *** Quality: *** Severity: ***  Precipitating: aggravated by *** Modifying factors: made better by *** Weakness: none Timing: *** Bowel/Bladder Dysfunction: she has urinary frequency. History of bowel incontinence for years- has sacral stimulator.   Conservative measures:  Physical therapy: ***  Multimodal medical therapy including regular antiinflammatories: prednisone, tylenol, ultram, tylenol #3, zanaflex, flexeril Injections:  06/05/2023: Left L5-S1 transforaminal ESI(no relief questionable increase in her pain) 02/04/2020: Left L5-S1 TF ESI (complete relief)   Past Surgery: ***  Kristen Kirk has ***no symptoms of cervical myelopathy.  The symptoms are causing a significant impact on the patient's life.   Review of Systems:  A 10 point review of systems is negative, except for the pertinent positives and negatives detailed in the HPI.  Past Medical History: Past Medical History:  Diagnosis Date   Anemia in chronic kidney disease    a. s/p aranesp 11/2013 now starting epo with HD 06/2014   Diabetes mellitus type II    a. Currently diet controlled since losing weight.   DJD (degenerative joint disease)    Essential hypertension    GERD (gastroesophageal reflux disease)    Glaucoma, both eyes    a. L>R   History of echocardiogram    a. 01/2015 Echo [facility]): EF ?55%, triv MR, mildly dil LA, nl PV.   History of end stage renal disease    previous on HD;   received kidney transplant 07/ 2018 (on dialysis since 2015 MWF)  followed by [facility] renal (Dr. Linus Salmons) considering  transplant Access: LUE fistula Establishing with [facility]   History of stress test    a. 03/2013 Myoview [facility]): EF 63%, no ischemia.   History of syncope    a. 12/2015.   History of Urge Incontinence    HLD (hyperlipidemia)    Osteoarthritis    Osteoporosis 05/03/2019   DEXA 04/2019 - 1.5 spine, -2.1 hip, -2.5 forearm Check phosphate and PTH to guide further management in h/o kidney transplant   S/P kidney transplant 04/2017   @[facility] ---   per pt one kidney from living-donor   Vaginal atrophy    a. vagifem caused spotting, normal TVS   Vitamin D deficiency    Wears glasses    stated on 05/31/2022   Wears partial dentures    upper 2 teeth. 05/31/2022    Past Surgical History: Past Surgical History:  Procedure Laterality Date   ANAL RECTAL MANOMETRY N/A 03/16/2021   Procedure: ANO RECTAL MANOMETRY;  Surgeon: Napoleon Form, MD;  Location: [facility];  Service: Endoscopy;  Laterality: N/A;   BLADDER SUSPENSION  2011   for stress incontinence-Dr. MacDiarmid   BREAST BIOPSY Right 12/15/2020   fat necrosis   CATARACT EXTRACTION W/ INTRAOCULAR LENS IMPLANT Bilateral    left 02-25-2021;  right ?   COLONOSCOPY  03/2019   diverticulosis, rpt 10 yrs (Nandigam)   KIDNEY TRANSPLANT Right 04/03/2017   [facility])   left eye laser surgery  06/2011   LIGATION OF ARTERIOVENOUS  FISTULA  10/2017   thrombosed L arm AFV   NEPHRECTOMY TRANSPLANTED ORGAN  REFRACTIVE SURGERY     TUBAL LIGATION Bilateral    1990s    Allergies: Allergies as of 07/04/2023 - Review Complete 05/15/2023  Allergen Reaction Noted   Brimonidine  03/02/2014   Shellfish-derived products Swelling 03/02/2014   Tramadol Other (See Comments) 01/05/2020    Medications: Outpatient Encounter Medications as of 07/04/2023  Medication Sig   acetaminophen (TYLENOL) 500 MG tablet Take 1 tablet (500 mg total) by mouth 3 (three) times daily as needed for moderate pain.   alendronate (FOSAMAX) 70 MG tablet  Take 1 tablet (70 mg total) by mouth every 7 (seven) days. Take with a full glass of water on an empty stomach.   amLODipine (NORVASC) 2.5 MG tablet Take 2.5 mg by mouth daily.   apraclonidine (IOPIDINE) 0.5 % ophthalmic solution Place 1 drop into the right eye daily.   aspirin 81 MG tablet Take 81 mg by mouth daily.   Blood Glucose Monitoring Suppl (ONE TOUCH ULTRA SYSTEM KIT) W/DEVICE KIT 1 kit by Does not apply route once.   carvedilol (COREG) 25 MG tablet Take 12.5 mg by mouth 2 (two) times daily with a meal.   Cholecalciferol (VITAMIN D3) 1000 units CAPS Take 2 capsules (2,000 Units total) by mouth daily.   docusate sodium (COLACE) 100 MG capsule Take 100 mg by mouth 2 (two) times daily as needed for mild constipation or moderate constipation.   dorzolamide-timolol (COSOPT) 2-0.5 % ophthalmic solution Apply to eye.   famotidine (PEPCID) 20 MG tablet Take 1 tablet (20 mg total) by mouth at bedtime.   glucose blood test strip Check sugars once daily   hydrochlorothiazide (HYDRODIURIL) 12.5 MG tablet Take 1 tablet (12.5 mg total) by mouth daily.   ketorolac (ACULAR) 0.5 % ophthalmic solution Place 1 drop into the left eye 4 (four) times daily.   latanoprost (XALATAN) 0.005 % ophthalmic solution Place 1 drop into the right eye at bedtime.   Melatonin 3 MG CAPS Take 1-2 capsules (3-6 mg total) by mouth at bedtime as needed (sleep).   memantine (NAMENDA) 5 MG tablet Take 1 tablet (5 mg total) by mouth daily.   Multiple Vitamin (MULTIVITAMIN) tablet Take 1 tablet by mouth daily.   mycophenolate (CELLCEPT) 500 MG tablet Take 500 mg by mouth 2 (two) times daily.   NEOMYCIN-POLYMYXIN-HYDROCORTISONE (CORTISPORIN) 1 % SOLN OTIC solution Apply 1-2 drops to toe BID after soaking   pantoprazole (PROTONIX) 20 MG tablet TAKE 1 TABLET BY MOUTH EVERY DAY   pravastatin (PRAVACHOL) 80 MG tablet Take 1 tablet (80 mg total) by mouth every evening.   prednisoLONE acetate (PRED FORTE) 1 % ophthalmic suspension  Place 1 drop into the left eye every 2 (two) hours while awake.   tacrolimus (PROGRAF) 0.5 MG capsule Take 0.5-1 mg by mouth See admin instructions. Take 2 capsules (1mg ) by mouth every morning and take 1 capsule (0.5mg ) by mouth every night   tiZANidine (ZANAFLEX) 2 MG tablet Take 1 tablet (2 mg total) by mouth 3 times/day as needed-between meals & bedtime for muscle spasms.   No facility-administered encounter medications on file as of 07/04/2023.    Social History: Social History   Tobacco Use   Smoking status: Former    Current packs/day: 0.00    Types: Cigarettes    Quit date: 1987    Years since quitting: 37.7   Smokeless tobacco: Never   Tobacco comments:    Remote- quit ~ late 1980's.  Vaping Use   Vaping status: Never Used  Substance Use  Topics   Alcohol use: No    Alcohol/week: 0.0 standard drinks of alcohol   Drug use: No    Family Medical History: Family History  Problem Relation Age of Onset   Other Father        she did not know her father.   Diabetes Mother    Heart failure Mother        died @ 65   Stroke Brother    Hyperlipidemia Brother    Hypertension Brother    Hyperlipidemia Brother    Hypertension Brother    Hyperlipidemia Sister    Hypertension Sister    Hyperlipidemia Sister    Hypertension Sister    Heart failure Maternal Grandfather    Breast cancer Neg Hx    Colon cancer Neg Hx    Esophageal cancer Neg Hx    Stomach cancer Neg Hx    Rectal cancer Neg Hx     Physical Examination: There were no vitals filed for this visit.  General: Patient is well developed, well nourished, calm, collected, and in no apparent distress. Attention to examination is appropriate.  Respiratory: Patient is breathing without any difficulty.   NEUROLOGICAL:     Awake, alert, oriented to person, place, and time.  Speech is clear and fluent. Fund of knowledge is appropriate.   Cranial Nerves: Pupils equal round and reactive to light.  Facial tone is  symmetric.    *** ROM of cervical spine *** pain *** posterior cervical tenderness. *** tenderness in bilateral trapezial region.   *** ROM of lumbar spine *** pain *** posterior lumbar tenderness.   No abnormal lesions on exposed skin.   Strength: Side Biceps Triceps Deltoid Interossei Grip Wrist Ext. Wrist Flex.  R 5 5 5 5 5 5 5   L 5 5 5 5 5 5 5    Side Iliopsoas Quads Hamstring PF DF EHL  R 5 5 5 5 5 5   L 5 5 5 5 5 5    Reflexes are ***2+ and symmetric at the biceps, brachioradialis, patella and achilles.   Hoffman's is absent.  Clonus is not present.   Bilateral upper and lower extremity sensation is intact to light touch.     Gait is normal.   ***No difficulty with tandem gait.    Medical Decision Making  Imaging: Lumbar xrays dated 05/23/23:  Diffuse lumbar spondylosis and DDD, slip L4-L5, sacral stimulator.   Report not available for above xrays.    Lumbar CT scan dated 06/07/23:  FINDINGS: Segmentation: 5 lumbar type vertebrae.   Alignment: Grade 1 anterolisthesis of L4 on L5 secondary to facet disease. 2 mm retrolisthesis of L2 on L3 and L1 on L2.   Vertebrae: No acute fracture or aggressive osseous lesion.   Paraspinal and other soft tissues: Abdominal aortic atherosclerosis. Bilateral renal atrophy.   Other: None   Disc levels:   Disc spaces: Degenerative disease with disc height loss at T11-12, L1-2, L2-3, L4-5 and L5-S1.   T12-L1: Mild broad-based disc bulge. Mild bilateral facet arthropathy. No foraminal or central canal stenosis.   L1-L2: Broad-based disc bulge. Moderate bilateral facet arthropathy. Mild right foraminal stenosis. No left foraminal stenosis. No spinal stenosis.   L2-L3: Broad-based disc bulge. Mild bilateral facet arthropathy. Moderate spinal stenosis. Bilateral subarticular recess stenosis. Moderate bilateral foraminal stenosis.   L3-L4: Broad-based disc bulge. Moderate bilateral facet arthropathy. Severe spinal stenosis.  Severe bilateral foraminal stenosis.   L4-L5: Broad-based disc bulge. Severe bilateral facet arthropathy. Mild bilateral foraminal stenosis. Severe  spinal stenosis.   L5-S1: Broad-based disc bulge. Severe left and mild right facet arthropathy. Severe bilateral foraminal stenosis.   IMPRESSION: 1. Lumbar spine spondylosis as described above. 2. No acute osseous injury of the lumbar spine. 3.  Aortic Atherosclerosis (ICD10-I70.0).     Electronically Signed   By: Elige Ko M.D.   On: 06/25/2023 11:52   I have personally reviewed the images and agree with the above interpretation.  Assessment and Plan: Kristen Kirk is a pleasant 72 y.o. female has ***  Treatment options discussed with patient and following plan made:   - Order for physical therapy for *** spine ***. Patient to call to schedule appointment. *** - Continue current medications including ***. Reviewed dosing and side effects.  - Prescription for ***. Reviewed dosing and side effects. Take with food.  - Prescription for *** to take prn muscle spasms. Reviewed dosing and side effects. Discussed this can cause drowsiness.  - MRI of *** to further evaluate *** radiculopathy. No improvement time or medications (***).  - Referral to [facility] at [facility] to discuss possible *** injections.  - Will schedule phone visit to review MRI results once I get them back.   I spent a total of *** minutes in face-to-face and non-face-to-face activities related to this patient's care today including review of outside records, review of imaging, review of symptoms, physical exam, discussion of differential diagnosis, discussion of treatment options, and documentation.   Thank you for involving me in the care of this patient.   Drake Leach PA-C Dept. of Neurosurgery

## 2023-07-02 DIAGNOSIS — H35352 Cystoid macular degeneration, left eye: Secondary | ICD-10-CM | POA: Diagnosis not present

## 2023-07-02 DIAGNOSIS — H401123 Primary open-angle glaucoma, left eye, severe stage: Secondary | ICD-10-CM | POA: Diagnosis not present

## 2023-07-03 DIAGNOSIS — K080 Exfoliation of teeth due to systemic causes: Secondary | ICD-10-CM | POA: Diagnosis not present

## 2023-07-04 ENCOUNTER — Ambulatory Visit: Payer: Medicare Other | Admitting: Orthopedic Surgery

## 2023-07-04 ENCOUNTER — Encounter: Payer: Self-pay | Admitting: Orthopedic Surgery

## 2023-07-04 VITALS — BP 124/76 | Ht 63.0 in | Wt 132.0 lb

## 2023-07-04 DIAGNOSIS — M4726 Other spondylosis with radiculopathy, lumbar region: Secondary | ICD-10-CM | POA: Diagnosis not present

## 2023-07-04 DIAGNOSIS — M4316 Spondylolisthesis, lumbar region: Secondary | ICD-10-CM

## 2023-07-04 DIAGNOSIS — M47816 Spondylosis without myelopathy or radiculopathy, lumbar region: Secondary | ICD-10-CM

## 2023-07-04 DIAGNOSIS — M48061 Spinal stenosis, lumbar region without neurogenic claudication: Secondary | ICD-10-CM

## 2023-07-04 DIAGNOSIS — M5416 Radiculopathy, lumbar region: Secondary | ICD-10-CM

## 2023-07-04 NOTE — Patient Instructions (Signed)
It was so nice to see you today. Thank you so much for coming in.    You have lots of wear and tear in your back (arthritis) along with spinal stenosis.   I sent physical therapy orders to [facility]. You can call them at number below if you don't hear from them to schedule your visit.   I sent a message to [facility] and they should call you for a follow up after your injection. You can call them at [phone].   If no improvement with above, we may need to get a CT myelogram and consider possible surgery options.   I will see you back in 6-8 weeks. Please do not hesitate to call if you have any questions or concerns. You can also message me in MyChart.   Drake Leach PA-C [phone]

## 2023-07-06 DIAGNOSIS — M4807 Spinal stenosis, lumbosacral region: Secondary | ICD-10-CM | POA: Diagnosis not present

## 2023-07-06 DIAGNOSIS — M4310 Spondylolisthesis, site unspecified: Secondary | ICD-10-CM | POA: Diagnosis not present

## 2023-07-06 DIAGNOSIS — M5416 Radiculopathy, lumbar region: Secondary | ICD-10-CM | POA: Diagnosis not present

## 2023-07-06 DIAGNOSIS — M48062 Spinal stenosis, lumbar region with neurogenic claudication: Secondary | ICD-10-CM | POA: Diagnosis not present

## 2023-07-09 ENCOUNTER — Ambulatory Visit: Payer: Medicare Other | Attending: Orthopedic Surgery

## 2023-07-09 DIAGNOSIS — R293 Abnormal posture: Secondary | ICD-10-CM | POA: Insufficient documentation

## 2023-07-09 DIAGNOSIS — R2689 Other abnormalities of gait and mobility: Secondary | ICD-10-CM | POA: Diagnosis not present

## 2023-07-09 DIAGNOSIS — M47816 Spondylosis without myelopathy or radiculopathy, lumbar region: Secondary | ICD-10-CM | POA: Insufficient documentation

## 2023-07-09 DIAGNOSIS — M48061 Spinal stenosis, lumbar region without neurogenic claudication: Secondary | ICD-10-CM | POA: Diagnosis not present

## 2023-07-09 DIAGNOSIS — M4316 Spondylolisthesis, lumbar region: Secondary | ICD-10-CM | POA: Diagnosis not present

## 2023-07-09 DIAGNOSIS — M5459 Other low back pain: Secondary | ICD-10-CM | POA: Diagnosis not present

## 2023-07-09 DIAGNOSIS — M6281 Muscle weakness (generalized): Secondary | ICD-10-CM | POA: Diagnosis not present

## 2023-07-09 DIAGNOSIS — M5416 Radiculopathy, lumbar region: Secondary | ICD-10-CM | POA: Diagnosis not present

## 2023-07-09 DIAGNOSIS — R278 Other lack of coordination: Secondary | ICD-10-CM | POA: Insufficient documentation

## 2023-07-09 NOTE — Therapy (Signed)
OUTPATIENT PHYSICAL THERAPY THORACOLUMBAR EVALUATION   Patient Name: Kristen Kirk MRN: [id number] DOB:Aug 18, 1951, 72 y.o., female Today's Date: 07/09/2023  END OF SESSION:  PT End of Session - 07/09/23 1621     Visit Number 1    Number of Visits 16    Date for PT Re-Evaluation 09/03/23    PT Start Time 1530    PT Stop Time 1614    PT Time Calculation (min) 44 min    Equipment Utilized During Treatment Gait belt    Activity Tolerance Patient tolerated treatment well    Behavior During Therapy WFL for tasks assessed/performed             Past Medical History:  Diagnosis Date   Anemia in chronic kidney disease    a. s/p aranesp 11/2013 now starting epo with HD 06/2014   Diabetes mellitus type II    a. Currently diet controlled since losing weight.   DJD (degenerative joint disease)    Essential hypertension    GERD (gastroesophageal reflux disease)    Glaucoma, both eyes    a. L>R   History of echocardiogram    a. 01/2015 [facility]): EF ?55%, triv MR, mildly dil LA, nl PV.   History of end stage renal disease    previous on HD;   received kidney transplant 07/ 2018 (on dialysis since 2015 MWF)  followed by [facility] renal (Dr. Linus Salmons) considering transplant Access: LUE fistula Establishing with [facility]   History of stress test    a. 03/2013 [facility]): EF 63%, no ischemia.   History of syncope    a. 12/2015.   History of Urge Incontinence    HLD (hyperlipidemia)    Osteoarthritis    Osteoporosis 05/03/2019   DEXA 04/2019 - 1.5 spine, -2.1 hip, -2.5 forearm Check phosphate and PTH to guide further management in h/o kidney transplant   S/P kidney transplant 04/2017   @[facility] ---   per pt one kidney from living-donor   Vaginal atrophy    a. vagifem caused spotting, normal TVS   Vitamin D deficiency    Wears glasses    stated on 05/31/2022   Wears partial dentures    upper 2 teeth. 05/31/2022   Past Surgical History:  Procedure Laterality Date   ANAL  RECTAL MANOMETRY N/A 03/16/2021   Procedure: ANO RECTAL MANOMETRY;  Surgeon: Napoleon Form, MD;  Location: [facility];  Service: Endoscopy;  Laterality: N/A;   BLADDER SUSPENSION  2011   for stress incontinence-Dr. MacDiarmid   BREAST BIOPSY Right 12/15/2020   fat necrosis   CATARACT EXTRACTION W/ INTRAOCULAR LENS IMPLANT Bilateral    left 02-25-2021;  right ?   COLONOSCOPY  03/2019   diverticulosis, rpt 10 yrs (Nandigam)   KIDNEY TRANSPLANT Right 04/03/2017   [facility])   left eye laser surgery  06/2011   LIGATION OF ARTERIOVENOUS  FISTULA  10/2017   thrombosed L arm AFV   NEPHRECTOMY TRANSPLANTED ORGAN     REFRACTIVE SURGERY     TUBAL LIGATION Bilateral    1990s   Patient Active Problem List   Diagnosis Date Noted   Skin lesion of breast 01/02/2023   Dark stools 08/08/2022   Acute renal failure superimposed on stage 3a chronic kidney disease (HCC) 07/09/2022   Anemia in chronic kidney disease    Essential hypertension    UTI (urinary tract infection)    Type II diabetes mellitus with renal manifestations (HCC)    Immunosuppressed status (HCC) 04/15/2022  Health maintenance examination 04/14/2022   Blurry vision 04/08/2021   Constipation due to outlet dysfunction    Acquired leg length discrepancy 01/28/2021   ASCUS of cervix with negative high risk HPV 10/01/2020   Thoracic spine pain 01/05/2020   Chronic lower back pain 10/08/2019   Osteoporosis 05/03/2019   Advanced care planning/counseling discussion 03/20/2019   MCI (mild cognitive impairment) with memory loss 03/20/2019   Right shoulder pain 06/19/2017   History of renal transplant 04/01/2017   Incontinence of feces 01/11/2017    Class: Acute   Insomnia 01/14/2013   Xerostomia 01/29/2012   Medicare annual wellness visit, subsequent 07/20/2011   Vaginal atrophy 04/17/2011   Type 2 diabetes mellitus with other specified complication (HCC)    Hyperlipidemia associated with type 2 diabetes mellitus (HCC)     GERD (gastroesophageal reflux disease)    Mixed incontinence urge and stress    Vitamin D deficiency 05/19/2009   MUSCLE CRAMPS 02/22/2009   Alopecia 10/05/2008   Vertigo 07/31/2008   Osteoarthritis 01/03/2007   Primary open angle glaucoma (POAG) of both eyes, severe stage 10/03/1999    PCP: Eustaquio Boyden MD  REFERRING PROVIDER: Liana Gerold MD   REFERRING DIAG: lumbar radiculopathy, lumbar spondylosis, spondylolisthesis of lumbar spine, spinal stenosis of lumbar region  Rationale for Evaluation and Treatment: Rehabilitation  THERAPY DIAG:  Other low back pain  Other abnormalities of gait and mobility  Abnormal posture  ONSET DATE: 1-2 weeks ago new pain began.   SUBJECTIVE:                                                                                                                                                                                           SUBJECTIVE STATEMENT: Patient presents with new onset of back pain.   PERTINENT HISTORY:  Patient presents to PT for evaluation of lumbar radiculopathy, lumbar spondylosis, spondylolisthesis of lumbar spine, spinal stenosis of lumbar region. Pmh includes renal transplant (2018), HTN, HLD, glaucoma, CKD, DM, osteoporosis, anemia, DM type II, DJD, GERD, vaginal atrophy, . She has a sacral stimulator. Per physician note she has one month of constant lateral left leg pain to her knee with pain worsening in the morning and with walking. In 2021 had L L5-S1 TF ESI. Patient reports pain is primarily in L hip, reports she was given a shot. Reports pain goes to back of her knee.   PAIN:  Are you having pain? Yes: NPRS scale: 7/10 Pain location: L low back to back of L knee  Pain description: sore Aggravating factors: sitting down a long time and trying to get up, walk distance Relieving factors: laying down  sleeping   Worst Pain: 8/10  Least pain: 3-4/10   PRECAUTIONS: None  RED FLAGS: Bowel or bladder  incontinence: Yes: fecal incontinence     WEIGHT BEARING RESTRICTIONS: No  FALLS:  Has patient fallen in last 6 months? No  LIVING ENVIRONMENT: Lives with: lives with their spouse Lives in: House/apartment Stairs:  basement downstairs,  two stories, has a chair lift  Has following equipment at home:  chair lift   OCCUPATION: retired   PLOF: Independent  PATIENT GOALS: to reduce pain     OBJECTIVE:  Note: Objective measures were completed at Evaluation unless otherwise noted.  DIAGNOSTIC FINDINGS:  EXAM: CT LUMBAR SPINE WITHOUT CONTRAST   TECHNIQUE: Multidetector CT imaging of the lumbar spine was performed without intravenous contrast administration. Multiplanar CT image reconstructions were also generated.   RADIATION DOSE REDUCTION: This exam was performed according to the departmental dose-optimization program which includes automated exposure control, adjustment of the mA and/or kV according to patient size and/or use of iterative reconstruction technique.   COMPARISON:  01/13/2020   FINDINGS: Segmentation: 5 lumbar type vertebrae.   Alignment: Grade 1 anterolisthesis of L4 on L5 secondary to facet disease. 2 mm retrolisthesis of L2 on L3 and L1 on L2.   Vertebrae: No acute fracture or aggressive osseous lesion.   Paraspinal and other soft tissues: Abdominal aortic atherosclerosis. Bilateral renal atrophy.   Other: None   Disc levels:   Disc spaces: Degenerative disease with disc height loss at T11-12, L1-2, L2-3, L4-5 and L5-S1.   T12-L1: Mild broad-based disc bulge. Mild bilateral facet arthropathy. No foraminal or central canal stenosis.   L1-L2: Broad-based disc bulge. Moderate bilateral facet arthropathy. Mild right foraminal stenosis. No left foraminal stenosis. No spinal stenosis.   L2-L3: Broad-based disc bulge. Mild bilateral facet arthropathy. Moderate spinal stenosis. Bilateral subarticular recess stenosis. Moderate bilateral foraminal  stenosis.   L3-L4: Broad-based disc bulge. Moderate bilateral facet arthropathy. Severe spinal stenosis. Severe bilateral foraminal stenosis.   L4-L5: Broad-based disc bulge. Severe bilateral facet arthropathy. Mild bilateral foraminal stenosis. Severe spinal stenosis.   L5-S1: Broad-based disc bulge. Severe left and mild right facet arthropathy. Severe bilateral foraminal stenosis.   IMPRESSION: 1. Lumbar spine spondylosis as described above. 2. No acute osseous injury of the lumbar spine. 3.  Aortic Atherosclerosis (ICD10-I70.0).    PATIENT SURVEYS:  FOTO 49  SCREENING FOR RED FLAGS: Bowel or bladder incontinence: Yes:     COGNITION: Overall cognitive status: Within functional limits for tasks assessed     SENSATION: Decreased R adductor region   MUSCLE LENGTH:  Thomas test limited bilaterally   POSTURE: anterior pelvic tilt  PALPATION: Significant tension of L piriformis and quadratus lumborum   LUMBAR ROM:   AROM eval  Flexion 54  Extension 6  Right lateral flexion Limited 50%  Left lateral flexion Limited 50 %  Right rotation Limited 50%  Left rotation Limited 50%    (Blank rows = not tested)    LOWER EXTREMITY MMT:    MMT Right eval Left eval  Hip flexion 4+ 4+  Hip extension    Hip abduction 4+ 4+  Hip adduction 4+ 4+  Hip internal rotation    Hip external rotation    Knee flexion 5 5  Knee extension 4+ 4+  Ankle dorsiflexion 5 5  Ankle plantarflexion 4+ 4+  Ankle inversion    Ankle eversion     (Blank rows = not tested)  LUMBAR SPECIAL TESTS:  Straight leg raise test: Positive,  SI Compression/distraction test: Positive, and FABER test: Negative  FUNCTIONAL TESTS:  5 times sit to stand: perform next session 10 meter walk test: perform next session    GAIT: Distance walked: 30 ft Assistive device utilized: None Level of assistance: Complete Independence Comments: antalgic gait pattern   TODAY'S TREATMENT:                                                                                                                               DATE: 07/09/23  Evaluation and HEP  Log rolling education    PATIENT EDUCATION:  Education details: goals, POC, HEP  Person educated: Patient Education method: Explanation, Demonstration, Tactile cues, and Verbal cues Education comprehension: verbalized understanding, returned demonstration, verbal cues required, and tactile cues required  HOME EXERCISE PROGRAM: Access Code: [facility] URL: [email or web address] Prepared by: Precious Bard  Exercises - Supine Lower Trunk Rotation  - 7 x weekly - 1 x daily - 2 sets - 10 reps - 5 hold - Supine Posterior Pelvic Tilt  - 7 x weekly - 1 x daily - 2 sets - 10 reps - 5 hold - Supine Piriformis Stretch with Foot on Ground  - 1 x daily - 7 x weekly - 2 sets - 1 reps - 30 hold  Print next time   ASSESSMENT:  CLINICAL IMPRESSION: Patient is a 72 y.o. female who was seen today for physical therapy evaluation and treatment for low back pain secondary to lumbar radiculopathy, lumbar spondylosis, spondylolisthesis of lumbar spine, and spinal stenosis of lumbar region per imaging and referral. Patient has significant tension of L piriformis and quadratus lumborum. Weakness of core noted in evaluation indicating need for core stabilization techniques. HEP given with patient demonstrating understanding. Will perform 5x STS and 10 MWT next session.to assess pain with mobility and ambulation. Patient will benefit from skilled physical therapy to reduce pain and improve mobility to return to PLOF.   OBJECTIVE IMPAIRMENTS: Abnormal gait, decreased activity tolerance, decreased endurance, decreased mobility, difficulty walking, decreased ROM, decreased strength, hypomobility, increased fascial restrictions, impaired perceived functional ability, impaired flexibility, impaired vision/preception, improper body mechanics, postural dysfunction, and pain.    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   REHAB POTENTIAL: Fair    CLINICAL DECISION MAKING: Evolving/moderate complexity  EVALUATION COMPLEXITY: Moderate   GOALS: Goals reviewed with patient? Yes  SHORT TERM GOALS: Target date: 08/06/2023    Patient will be independent in home exercise program to improve strength/mobility for better functional independence with ADLs. Baseline:10/7 HEP given  Goal status: INITIAL  LONG TERM GOALS: Target date: 09/03/2023    Patient will increase FOTO score to equal to or greater than  67%   to demonstrate statistically significant improvement in mobility and quality of life.  Baseline: 49 Goal status: INITIAL  2.  Patient will report a worst pain of 3/10 on VAS in  low back  to improve tolerance with ADLs and reduced symptoms with activities.  Baseline: 10/7: 8/10  Goal status: INITIAL  3. Patient will be modified independent in walking on even/uneven surface with least restrictive assistive device, for 20+ minutes without rest break, reporting some difficulty or less to improve walking tolerance with community ambulation including grocery shopping, going to church,etc.  Baseline: pain with walking  Goal status: INITIAL  4.  Patient (> 78 years old) will complete five times sit to stand test in < 15 seconds indicating an increased LE strength and improved balance. Baseline: perform  next session   Goal status: INITIAL    PLAN:  PT FREQUENCY: 2x/week  PT DURATION: 8 weeks  PLANNED INTERVENTIONS: Therapeutic exercises, Therapeutic activity, Neuromuscular re-education, Balance training, Gait training, Patient/Family education, Self Care, Joint mobilization, Stair training, Vestibular training, Canalith repositioning, Visual/preceptual remediation/compensation, DME instructions, Dry Needling, Spinal mobilization, Cryotherapy, Moist heat, Splintting, Taping, Manual therapy, and Re-evaluation.  PLAN FOR NEXT SESSION: 10 MWT, 5x STS test, pain reduction, piriformis stretching, core strengthening , re-review log rolling if she can.    Precious Bard, PT 07/09/2023, 4:38 PM

## 2023-07-11 ENCOUNTER — Ambulatory Visit: Payer: Medicare Other | Admitting: Physical Therapy

## 2023-07-11 DIAGNOSIS — R293 Abnormal posture: Secondary | ICD-10-CM | POA: Diagnosis not present

## 2023-07-11 DIAGNOSIS — M5459 Other low back pain: Secondary | ICD-10-CM

## 2023-07-11 DIAGNOSIS — R2689 Other abnormalities of gait and mobility: Secondary | ICD-10-CM

## 2023-07-11 DIAGNOSIS — M48061 Spinal stenosis, lumbar region without neurogenic claudication: Secondary | ICD-10-CM | POA: Diagnosis not present

## 2023-07-11 DIAGNOSIS — R278 Other lack of coordination: Secondary | ICD-10-CM | POA: Diagnosis not present

## 2023-07-11 DIAGNOSIS — N3946 Mixed incontinence: Secondary | ICD-10-CM | POA: Diagnosis not present

## 2023-07-11 DIAGNOSIS — M5416 Radiculopathy, lumbar region: Secondary | ICD-10-CM | POA: Diagnosis not present

## 2023-07-11 DIAGNOSIS — M4316 Spondylolisthesis, lumbar region: Secondary | ICD-10-CM | POA: Diagnosis not present

## 2023-07-11 DIAGNOSIS — M6281 Muscle weakness (generalized): Secondary | ICD-10-CM

## 2023-07-11 DIAGNOSIS — M47816 Spondylosis without myelopathy or radiculopathy, lumbar region: Secondary | ICD-10-CM | POA: Diagnosis not present

## 2023-07-11 NOTE — Therapy (Unsigned)
OUTPATIENT PHYSICAL THERAPY THORACOLUMBAR EVALUATION   Patient Name: Kristen Kirk MRN: [id number] DOB:[birth year], 72 y.o., female Today's Date: 07/11/2023  END OF SESSION:  PT End of Session - 07/11/23 1501     Visit Number 2    Number of Visits 16    Date for PT Re-Evaluation 09/03/23    PT Start Time 1501    PT Stop Time 1530    PT Time Calculation (min) 29 min    Equipment Utilized During Treatment Gait belt    Activity Tolerance Patient tolerated treatment well    Behavior During Therapy WFL for tasks assessed/performed             Past Medical History:  Diagnosis Date   Anemia in chronic kidney disease    a. s/p aranesp 11/2013 now starting epo with HD 06/2014   Diabetes mellitus type II    a. Currently diet controlled since losing weight.   DJD (degenerative joint disease)    Essential hypertension    GERD (gastroesophageal reflux disease)    Glaucoma, both eyes    a. L>R   History of echocardiogram    a. 01/2015 [facility]): EF ?55%, triv MR, mildly dil LA, nl PV.   History of end stage renal disease    previous on HD;   received kidney transplant 07/ 2018 (on dialysis since 2015 MWF)  followed by [facility] renal (Dr. Linus Salmons) considering transplant Access: LUE fistula Establishing with [place] [facility]   History of stress test    a. 03/2013 [facility]): EF 63%, no ischemia.   History of syncope    a. 12/2015.   History of Urge Incontinence    HLD (hyperlipidemia)    Osteoarthritis    Osteoporosis 05/03/2019   DEXA 04/2019 - 1.5 spine, -2.1 hip, -2.5 forearm Check phosphate and PTH to guide further management in h/o kidney transplant   S/P kidney transplant 04/2017   @[facility] ---   per pt one kidney from living-donor   Vaginal atrophy    a. vagifem caused spotting, normal TVS   Vitamin D deficiency    Wears glasses    stated on 05/31/2022   Wears partial dentures    upper 2 teeth. 05/31/2022   Past Surgical History:  Procedure Laterality Date   ANAL  RECTAL MANOMETRY N/A 03/16/2021   Procedure: ANO RECTAL MANOMETRY;  Surgeon: Napoleon Form, MD;  Location: [facility] ENDOSCOPY;  Service: Endoscopy;  Laterality: N/A;   BLADDER SUSPENSION  2011   for stress incontinence-Dr. MacDiarmid   BREAST BIOPSY Right 12/15/2020   fat necrosis   CATARACT EXTRACTION W/ INTRAOCULAR LENS IMPLANT Bilateral    left 02-25-2021;  right ?   COLONOSCOPY  03/2019   diverticulosis, rpt 10 yrs (Nandigam)   KIDNEY TRANSPLANT Right 04/03/2017   [facility])   left eye laser surgery  06/2011   LIGATION OF ARTERIOVENOUS  FISTULA  10/2017   thrombosed L arm AFV   NEPHRECTOMY TRANSPLANTED ORGAN     REFRACTIVE SURGERY     TUBAL LIGATION Bilateral    1990s   Patient Active Problem List   Diagnosis Date Noted   Skin lesion of breast 01/02/2023   Dark stools 08/08/2022   Acute renal failure superimposed on stage 3a chronic kidney disease (HCC) 07/09/2022   Anemia in chronic kidney disease    Essential hypertension    UTI (urinary tract infection)    Type II diabetes mellitus with renal manifestations (HCC)    Immunosuppressed status (HCC) 04/15/2022  Health maintenance examination 04/14/2022   Blurry vision 04/08/2021   Constipation due to outlet dysfunction    Acquired leg length discrepancy 01/28/2021   ASCUS of cervix with negative high risk HPV 10/01/2020   Thoracic spine pain 01/05/2020   Chronic lower back pain 10/08/2019   Osteoporosis 05/03/2019   Advanced care planning/counseling discussion 03/20/2019   MCI (mild cognitive impairment) with memory loss 03/20/2019   Right shoulder pain 06/19/2017   History of renal transplant 04/01/2017   Incontinence of feces 01/11/2017    Class: Acute   Insomnia 01/14/2013   Xerostomia 01/29/2012   Medicare annual wellness visit, subsequent 07/20/2011   Vaginal atrophy 04/17/2011   Type 2 diabetes mellitus with other specified complication (HCC)    Hyperlipidemia associated with type 2 diabetes mellitus (HCC)     GERD (gastroesophageal reflux disease)    Mixed incontinence urge and stress    Vitamin D deficiency 05/19/2009   MUSCLE CRAMPS 02/22/2009   Alopecia 10/05/2008   Vertigo 07/31/2008   Osteoarthritis 01/03/2007   Primary open angle glaucoma (POAG) of both eyes, severe stage 10/03/1999    PCP: Eustaquio Boyden MD  REFERRING PROVIDER: Liana Gerold MD   REFERRING DIAG: lumbar radiculopathy, lumbar spondylosis, spondylolisthesis of lumbar spine, spinal stenosis of lumbar region  Rationale for Evaluation and Treatment: Rehabilitation  THERAPY DIAG:  Other low back pain  Other abnormalities of gait and mobility  Abnormal posture  Muscle weakness (generalized)  Other lack of coordination  ONSET DATE: 1-2 weeks ago new pain began.   SUBJECTIVE:                                                                                                                                                                                           SUBJECTIVE STATEMENT: Pt arrives late to PT appointment, states that there was a lot of traffic on the way to the hospital. Pt states that her hip is bothering her a little today. Also reports itch in L foot, needing to put cream on foot at start of PT treatment for possible athletes foot...   PERTINENT HISTORY:  Patient presents to PT for evaluation of lumbar radiculopathy, lumbar spondylosis, spondylolisthesis of lumbar spine, spinal stenosis of lumbar region. Pmh includes renal transplant (2018), HTN, HLD, glaucoma, CKD, DM, osteoporosis, anemia, DM type II, DJD, GERD, vaginal atrophy, . She has a sacral stimulator. Per physician note she has one month of constant lateral left leg pain to her knee with pain worsening in the morning and with walking. In 2021 had L L5-S1 TF ESI. Patient reports pain is primarily in L hip, reports she was given a  shot. Reports pain goes to back of her knee.   PAIN:  Are you having pain? Yes: NPRS scale: 8/10 Pain  location: L hip region  Pain description: sore Aggravating factors: sitting down a long time and trying to get up, walk distance Relieving factors: laying down sleeping   Worst Pain: 8/10  Least pain: 3-4/10   PRECAUTIONS: None  RED FLAGS: Bowel or bladder incontinence: Yes: fecal incontinence     WEIGHT BEARING RESTRICTIONS: No  FALLS:  Has patient fallen in last 6 months? No  LIVING ENVIRONMENT: Lives with: lives with their spouse Lives in: House/apartment Stairs:  basement downstairs,  two stories, has a chair lift  Has following equipment at home:  chair lift   OCCUPATION: retired   PLOF: Independent  PATIENT GOALS: to reduce pain     OBJECTIVE:  Note: Objective measures were completed at Evaluation unless otherwise noted.  DIAGNOSTIC FINDINGS:  EXAM: CT LUMBAR SPINE WITHOUT CONTRAST   TECHNIQUE: Multidetector CT imaging of the lumbar spine was performed without intravenous contrast administration. Multiplanar CT image reconstructions were also generated.   RADIATION DOSE REDUCTION: This exam was performed according to the departmental dose-optimization program which includes automated exposure control, adjustment of the mA and/or kV according to patient size and/or use of iterative reconstruction technique.   COMPARISON:  01/13/2020   FINDINGS: Segmentation: 5 lumbar type vertebrae.   Alignment: Grade 1 anterolisthesis of L4 on L5 secondary to facet disease. 2 mm retrolisthesis of L2 on L3 and L1 on L2.   Vertebrae: No acute fracture or aggressive osseous lesion.   Paraspinal and other soft tissues: Abdominal aortic atherosclerosis. Bilateral renal atrophy.   Other: None   Disc levels:   Disc spaces: Degenerative disease with disc height loss at T11-12, L1-2, L2-3, L4-5 and L5-S1.   T12-L1: Mild broad-based disc bulge. Mild bilateral facet arthropathy. No foraminal or central canal stenosis.   L1-L2: Broad-based disc bulge. Moderate  bilateral facet arthropathy. Mild right foraminal stenosis. No left foraminal stenosis. No spinal stenosis.   L2-L3: Broad-based disc bulge. Mild bilateral facet arthropathy. Moderate spinal stenosis. Bilateral subarticular recess stenosis. Moderate bilateral foraminal stenosis.   L3-L4: Broad-based disc bulge. Moderate bilateral facet arthropathy. Severe spinal stenosis. Severe bilateral foraminal stenosis.   L4-L5: Broad-based disc bulge. Severe bilateral facet arthropathy. Mild bilateral foraminal stenosis. Severe spinal stenosis.   L5-S1: Broad-based disc bulge. Severe left and mild right facet arthropathy. Severe bilateral foraminal stenosis.   IMPRESSION: 1. Lumbar spine spondylosis as described above. 2. No acute osseous injury of the lumbar spine. 3.  Aortic Atherosclerosis (ICD10-I70.0).    PATIENT SURVEYS:  FOTO 49  SCREENING FOR RED FLAGS: Bowel or bladder incontinence: Yes:     COGNITION: Overall cognitive status: Within functional limits for tasks assessed     SENSATION: Decreased R adductor region   MUSCLE LENGTH:  Thomas test limited bilaterally   POSTURE: anterior pelvic tilt  PALPATION: Significant tension of L piriformis and quadratus lumborum   LUMBAR ROM:   AROM eval  Flexion 54  Extension 6  Right lateral flexion Limited 50%  Left lateral flexion Limited 50 %  Right rotation Limited 50%  Left rotation Limited 50%    (Blank rows = not tested)    LOWER EXTREMITY MMT:    MMT Right eval Left eval  Hip flexion 4+ 4+  Hip extension    Hip abduction 4+ 4+  Hip adduction 4+ 4+  Hip internal rotation    Hip external rotation  Knee flexion 5 5  Knee extension 4+ 4+  Ankle dorsiflexion 5 5  Ankle plantarflexion 4+ 4+  Ankle inversion    Ankle eversion     (Blank rows = not tested)  LUMBAR SPECIAL TESTS:  Straight leg raise test: Positive, SI Compression/distraction test: Positive, and FABER test: Negative  FUNCTIONAL TESTS:   5 times sit to stand: 15.415 10 meter walk test: 10.94sec 0.920m/s     GAIT: Distance walked: 30 ft Assistive device utilized: None Level of assistance: Complete Independence Comments: antalgic gait pattern   TODAY'S TREATMENT:                                                                                                                              DATE: 07/11/23  Pt applied foot cream for itching L foot...   Pt performed 5 time sit<>stand (5xSTS): 15.415 sec (>15 sec indicates increased fall risk)   : 0.932m/s   HEP review  LTR x 12  Piriformis stretch 2 x 30 sec bil  Pelvic tilt x 12   Moderate cues proper ROM and technique to improve core activation.   PATIENT EDUCATION:  Education details: goals, POC, HEP  Person educated: Patient Education method: Explanation, Demonstration, Tactile cues, and Verbal cues Education comprehension: verbalized understanding, returned demonstration, verbal cues required, and tactile cues required  HOME EXERCISE PROGRAM: Access Code: [facility] URL: [email or web address] Prepared by: Precious Bard  Exercises - Supine Lower Trunk Rotation  - 7 x weekly - 1 x daily - 2 sets - 10 reps - 5 hold - Supine Posterior Pelvic Tilt  - 7 x weekly - 1 x daily - 2 sets - 10 reps - 5 hold - Supine Piriformis Stretch with Foot on Ground  - 1 x daily - 7 x weekly - 2 sets - 1 reps - 30 hold     ASSESSMENT:  CLINICAL IMPRESSION: Pt arrived lat to treatment. Patient is a 72 y.o. female who was seen today for physical therapy  treatment for low back pain secondary to lumbar radiculopathy, lumbar spondylosis, spondylolisthesis of lumbar spine, and spinal stenosis of lumbar region per imaging and referral. Increased time on 5xSTS and with noted pain in the L hip with 10 MWT. HEP review with moderate cues for technique for proper set up. Will need continued review.  Patient will benefit from skilled physical therapy to reduce pain and  improve mobility to return to PLOF.   OBJECTIVE IMPAIRMENTS: Abnormal gait, decreased activity tolerance, decreased endurance, decreased mobility, difficulty walking, decreased ROM, decreased strength, hypomobility, increased fascial restrictions, impaired perceived functional ability, impaired flexibility, impaired vision/preception, improper body mechanics, postural dysfunction, and pain.   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   REHAB POTENTIAL: Fair    CLINICAL DECISION MAKING: Evolving/moderate complexity  EVALUATION COMPLEXITY: Moderate   GOALS: Goals reviewed with patient? Yes  SHORT TERM GOALS: Target date: 08/06/2023    Patient will be independent in home exercise program to improve strength/mobility for better functional independence with ADLs. Baseline:10/7 HEP given  Goal status: INITIAL    LONG TERM GOALS: Target date: 09/03/2023    Patient will increase FOTO score to equal to or greater than  67%   to demonstrate statistically significant improvement in mobility and quality of life.  Baseline: 49 Goal status: INITIAL  2.  Patient will report a worst pain of 3/10 on VAS in  low back  to improve tolerance with ADLs and reduced symptoms with activities.  Baseline: 10/7: 8/10  Goal status: INITIAL  3. Patient will be modified independent in walking on even/uneven surface with least restrictive assistive device,  for 20+ minutes without rest break, reporting some difficulty or less to improve walking tolerance with community ambulation including grocery shopping, going to church,etc.  Baseline: pain with walking  Goal status: INITIAL  4.  Patient (> 20 years old) will complete five times sit to stand test in < 15 seconds indicating an increased LE strength and improved balance. Baseline: 15.415 sec, slight increase in L hip pain  Goal status: INITIAL    PLAN:  PT FREQUENCY: 2x/week  PT DURATION: 8 weeks  PLANNED INTERVENTIONS: Therapeutic exercises, Therapeutic activity, Neuromuscular re-education, Balance training, Gait training, Patient/Family education, Self Care, Joint mobilization, Stair training, Vestibular training, Canalith repositioning, Visual/preceptual remediation/compensation, DME instructions, Dry Needling, Spinal mobilization, Cryotherapy, Moist heat, Splintting, Taping, Manual therapy, and Re-evaluation.  PLAN FOR NEXT SESSION: pain reduction, piriformis stretching, core strengthening , re-review log rolling if she can.    Golden Pop, PT 07/11/2023, 3:01 PM

## 2023-07-16 ENCOUNTER — Ambulatory Visit: Payer: Medicare Other | Admitting: Physical Therapy

## 2023-07-16 DIAGNOSIS — M48061 Spinal stenosis, lumbar region without neurogenic claudication: Secondary | ICD-10-CM | POA: Diagnosis not present

## 2023-07-16 DIAGNOSIS — M5459 Other low back pain: Secondary | ICD-10-CM

## 2023-07-16 DIAGNOSIS — R293 Abnormal posture: Secondary | ICD-10-CM | POA: Diagnosis not present

## 2023-07-16 DIAGNOSIS — M4316 Spondylolisthesis, lumbar region: Secondary | ICD-10-CM | POA: Diagnosis not present

## 2023-07-16 DIAGNOSIS — Z961 Presence of intraocular lens: Secondary | ICD-10-CM | POA: Diagnosis not present

## 2023-07-16 DIAGNOSIS — M5416 Radiculopathy, lumbar region: Secondary | ICD-10-CM | POA: Diagnosis not present

## 2023-07-16 DIAGNOSIS — H401133 Primary open-angle glaucoma, bilateral, severe stage: Secondary | ICD-10-CM | POA: Diagnosis not present

## 2023-07-16 DIAGNOSIS — R278 Other lack of coordination: Secondary | ICD-10-CM

## 2023-07-16 DIAGNOSIS — M6281 Muscle weakness (generalized): Secondary | ICD-10-CM

## 2023-07-16 DIAGNOSIS — R2689 Other abnormalities of gait and mobility: Secondary | ICD-10-CM

## 2023-07-16 DIAGNOSIS — M47816 Spondylosis without myelopathy or radiculopathy, lumbar region: Secondary | ICD-10-CM | POA: Diagnosis not present

## 2023-07-16 NOTE — Therapy (Unsigned)
OUTPATIENT PHYSICAL THERAPY THORACOLUMBAR EVALUATION   Patient Name: Kristen Kirk MRN: [id number] DOB:Sep 08, 1951, 72 y.o., female Today's Date: 07/16/2023  END OF SESSION:  PT End of Session - 07/16/23 1448     Visit Number 3    Number of Visits 16    Date for PT Re-Evaluation 09/03/23    PT Start Time 1448    PT Stop Time 1530    PT Time Calculation (min) 42 min    Equipment Utilized During Treatment Gait belt    Activity Tolerance Patient tolerated treatment well    Behavior During Therapy WFL for tasks assessed/performed             Past Medical History:  Diagnosis Date   Anemia in chronic kidney disease    a. s/p aranesp 11/2013 now starting epo with HD 06/2014   Diabetes mellitus type II    a. Currently diet controlled since losing weight.   DJD (degenerative joint disease)    Essential hypertension    GERD (gastroesophageal reflux disease)    Glaucoma, both eyes    a. L>R   History of echocardiogram    a. 01/2015 Echo [facility]): EF ?55%, triv MR, mildly dil LA, nl PV.   History of end stage renal disease    previous on HD;   received kidney transplant 07/ 2018 (on dialysis since 2015 MWF)  followed by [facility] renal (Dr. Linus Salmons) considering transplant Access: LUE fistula Establishing with [facility]   History of stress test    a. 03/2013 Myoview [facility]): EF 63%, no ischemia.   History of syncope    a. 12/2015.   History of Urge Incontinence    HLD (hyperlipidemia)    Osteoarthritis    Osteoporosis 05/03/2019   DEXA 04/2019 - 1.5 spine, -2.1 hip, -2.5 forearm Check phosphate and PTH to guide further management in h/o kidney transplant   S/P kidney transplant 04/2017   @[facility] ---   per pt one kidney from living-donor   Vaginal atrophy    a. vagifem caused spotting, normal TVS   Vitamin D deficiency    Wears glasses    stated on 05/31/2022   Wears partial dentures    upper 2 teeth. 05/31/2022   Past Surgical History:  Procedure Laterality Date   ANAL  RECTAL MANOMETRY N/A 03/16/2021   Procedure: ANO RECTAL MANOMETRY;  Surgeon: Napoleon Form, MD;  Location: [facility];  Service: Endoscopy;  Laterality: N/A;   BLADDER SUSPENSION  2011   for stress incontinence-Dr. MacDiarmid   BREAST BIOPSY Right 12/15/2020   fat necrosis   CATARACT EXTRACTION W/ INTRAOCULAR LENS IMPLANT Bilateral    left 02-25-2021;  right ?   COLONOSCOPY  03/2019   diverticulosis, rpt 10 yrs (Nandigam)   KIDNEY TRANSPLANT Right 04/03/2017   [facility])   left eye laser surgery  06/2011   LIGATION OF ARTERIOVENOUS  FISTULA  10/2017   thrombosed L arm AFV   NEPHRECTOMY TRANSPLANTED ORGAN     REFRACTIVE SURGERY     TUBAL LIGATION Bilateral    1990s   Patient Active Problem List   Diagnosis Date Noted   Skin lesion of breast 01/02/2023   Dark stools 08/08/2022   Acute renal failure superimposed on stage 3a chronic kidney disease (HCC) 07/09/2022   Anemia in chronic kidney disease    Essential hypertension    UTI (urinary tract infection)    Type II diabetes mellitus with renal manifestations (HCC)    Immunosuppressed status (HCC) 04/15/2022  Health maintenance examination 04/14/2022   Blurry vision 04/08/2021   Constipation due to outlet dysfunction    Acquired leg length discrepancy 01/28/2021   ASCUS of cervix with negative high risk HPV 10/01/2020   Thoracic spine pain 01/05/2020   Chronic lower back pain 10/08/2019   Osteoporosis 05/03/2019   Advanced care planning/counseling discussion 03/20/2019   MCI (mild cognitive impairment) with memory loss 03/20/2019   Right shoulder pain 06/19/2017   History of renal transplant 04/01/2017   Incontinence of feces 01/11/2017    Class: Acute   Insomnia 01/14/2013   Xerostomia 01/29/2012   Medicare annual wellness visit, subsequent 07/20/2011   Vaginal atrophy 04/17/2011   Type 2 diabetes mellitus with other specified complication (HCC)    Hyperlipidemia associated with type 2 diabetes mellitus (HCC)     GERD (gastroesophageal reflux disease)    Mixed incontinence urge and stress    Vitamin D deficiency 05/19/2009   MUSCLE CRAMPS 02/22/2009   Alopecia 10/05/2008   Vertigo 07/31/2008   Osteoarthritis 01/03/2007   Primary open angle glaucoma (POAG) of both eyes, severe stage 10/03/1999    PCP: Eustaquio Boyden MD  REFERRING PROVIDER: Liana Gerold MD   REFERRING DIAG: lumbar radiculopathy, lumbar spondylosis, spondylolisthesis of lumbar spine, spinal stenosis of lumbar region  Rationale for Evaluation and Treatment: Rehabilitation  THERAPY DIAG:  Other low back pain  Other abnormalities of gait and mobility  Abnormal posture  Muscle weakness (generalized)  Other lack of coordination  ONSET DATE: 1-2 weeks ago new pain began.   SUBJECTIVE:                                                                                                                                                                                           SUBJECTIVE STATEMENT: Pt states that she is doing okay today. Mild soreness in low back, and states that she feels like her L leg wants to give way sometimes.   PERTINENT HISTORY:  Patient presents to PT for evaluation of lumbar radiculopathy, lumbar spondylosis, spondylolisthesis of lumbar spine, spinal stenosis of lumbar region. Pmh includes renal transplant (2018), HTN, HLD, glaucoma, CKD, DM, osteoporosis, anemia, DM type II, DJD, GERD, vaginal atrophy, . She has a sacral stimulator. Per physician note she has one month of constant lateral left leg pain to her knee with pain worsening in the morning and with walking. In 2021 had L L5-S1 TF ESI. Patient reports pain is primarily in L hip, reports she was given a shot. Reports pain goes to back of her knee.   PAIN:  Are you having pain? Yes: NPRS scale: 8/10 Pain location: L hip  region  Pain description: sore Aggravating factors: sitting down a long time and trying to get up, walk  distance Relieving factors: laying down sleeping   Worst Pain: 8/10  Least pain: 3-4/10   PRECAUTIONS: None  RED FLAGS: Bowel or bladder incontinence: Yes: fecal incontinence     WEIGHT BEARING RESTRICTIONS: No  FALLS:  Has patient fallen in last 6 months? No  LIVING ENVIRONMENT: Lives with: lives with their spouse Lives in: House/apartment Stairs:  basement downstairs,  two stories, has a chair lift  Has following equipment at home:  chair lift   OCCUPATION: retired   PLOF: Independent  PATIENT GOALS: to reduce pain     OBJECTIVE:  Note: Objective measures were completed at Evaluation unless otherwise noted.  DIAGNOSTIC FINDINGS:  EXAM: CT LUMBAR SPINE WITHOUT CONTRAST   TECHNIQUE: Multidetector CT imaging of the lumbar spine was performed without intravenous contrast administration. Multiplanar CT image reconstructions were also generated.   RADIATION DOSE REDUCTION: This exam was performed according to the departmental dose-optimization program which includes automated exposure control, adjustment of the mA and/or kV according to patient size and/or use of iterative reconstruction technique.   COMPARISON:  01/13/2020   FINDINGS: Segmentation: 5 lumbar type vertebrae.   Alignment: Grade 1 anterolisthesis of L4 on L5 secondary to facet disease. 2 mm retrolisthesis of L2 on L3 and L1 on L2.   Vertebrae: No acute fracture or aggressive osseous lesion.   Paraspinal and other soft tissues: Abdominal aortic atherosclerosis. Bilateral renal atrophy.   Other: None   Disc levels:   Disc spaces: Degenerative disease with disc height loss at T11-12, L1-2, L2-3, L4-5 and L5-S1.   T12-L1: Mild broad-based disc bulge. Mild bilateral facet arthropathy. No foraminal or central canal stenosis.   L1-L2: Broad-based disc bulge. Moderate bilateral facet arthropathy. Mild right foraminal stenosis. No left foraminal stenosis. No spinal stenosis.   L2-L3:  Broad-based disc bulge. Mild bilateral facet arthropathy. Moderate spinal stenosis. Bilateral subarticular recess stenosis. Moderate bilateral foraminal stenosis.   L3-L4: Broad-based disc bulge. Moderate bilateral facet arthropathy. Severe spinal stenosis. Severe bilateral foraminal stenosis.   L4-L5: Broad-based disc bulge. Severe bilateral facet arthropathy. Mild bilateral foraminal stenosis. Severe spinal stenosis.   L5-S1: Broad-based disc bulge. Severe left and mild right facet arthropathy. Severe bilateral foraminal stenosis.   IMPRESSION: 1. Lumbar spine spondylosis as described above. 2. No acute osseous injury of the lumbar spine. 3.  Aortic Atherosclerosis (ICD10-I70.0).    PATIENT SURVEYS:  FOTO 49  SCREENING FOR RED FLAGS: Bowel or bladder incontinence: Yes:     COGNITION: Overall cognitive status: Within functional limits for tasks assessed     SENSATION: Decreased R adductor region   MUSCLE LENGTH:  Thomas test limited bilaterally   POSTURE: anterior pelvic tilt  PALPATION: Significant tension of L piriformis and quadratus lumborum   LUMBAR ROM:   AROM eval  Flexion 54  Extension 6  Right lateral flexion Limited 50%  Left lateral flexion Limited 50 %  Right rotation Limited 50%  Left rotation Limited 50%    (Blank rows = not tested)    LOWER EXTREMITY MMT:    MMT Right eval Left eval  Hip flexion 4+ 4+  Hip extension    Hip abduction 4+ 4+  Hip adduction 4+ 4+  Hip internal rotation    Hip external rotation    Knee flexion 5 5  Knee extension 4+ 4+  Ankle dorsiflexion 5 5  Ankle plantarflexion 4+ 4+  Ankle inversion  Ankle eversion     (Blank rows = not tested)  LUMBAR SPECIAL TESTS:  Straight leg raise test: Positive, SI Compression/distraction test: Positive, and FABER test: Negative  FUNCTIONAL TESTS:  5 times sit to stand: 15.415 10 meter walk test: 10.94sec 0.95m/s     GAIT: Distance walked: 30 ft Assistive  device utilized: None Level of assistance: Complete Independence Comments: antalgic gait pattern   TODAY'S TREATMENT:                                                                                                                              DATE: 07/16/23  Supine therex:  LTR with 3 sec hold Bridge with 3 sec hold SAQ x 15 with 2 sec hold SLR x 12 with 2 sec hold  Sidelying hip abduction x 10 with 2 sec hol d Sidelying clam shell x 12 with 3 sec hold  Qped:  Donkey kick x 6 bil  UE raise x8 bil   Piriformis stretch x 45 sec bil  Cues for full ROM and proper hold at end range as well as decreased speed of eccentric movement.     PATIENT EDUCATION:  Education details: goals, POC, HEP  Person educated: Patient Education method: Explanation, Demonstration, Tactile cues, and Verbal cues Education comprehension: verbalized understanding, returned demonstration, verbal cues required, and tactile cues required  HOME EXERCISE PROGRAM: Access Code: [facility] URL: [email or web address] Prepared by: Precious Bard  Exercises - Supine Lower Trunk Rotation  - 7 x weekly - 1 x daily - 2 sets - 10 reps - 5 hold - Supine Posterior Pelvic Tilt  - 7 x weekly - 1 x daily - 2 sets - 10 reps - 5 hold - Supine Piriformis Stretch with Foot on Ground  - 1 x daily - 7 x weekly - 2 sets - 1 reps - 30 hold     ASSESSMENT:  CLINICAL IMPRESSION: Pt arrived motivated to participate. PT treatment instructed pt in BLE/core stability therex on this day as well as ROM and improved muscle extensibility in bil glutes/piriformis. Pt performed all therex will with increased difficulty on the LLE compared to the r on this day.  Patient will benefit from skilled physical therapy to reduce pain and improve mobility to return to PLOF.   OBJECTIVE IMPAIRMENTS: Abnormal gait, decreased activity tolerance, decreased endurance, decreased mobility, difficulty walking, decreased ROM, decreased strength,  hypomobility, increased fascial restrictions, impaired perceived functional ability, impaired flexibility, impaired vision/preception, improper body mechanics, postural dysfunction, and pain.   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  REHAB POTENTIAL: Fair    CLINICAL DECISION MAKING: Evolving/moderate complexity  EVALUATION COMPLEXITY: Moderate   GOALS: Goals reviewed with patient? Yes  SHORT TERM GOALS: Target date: 08/06/2023    Patient will be independent in home exercise program to improve strength/mobility for better functional independence with ADLs. Baseline:10/7 HEP given  Goal status: INITIAL    LONG TERM GOALS: Target date: 09/03/2023    Patient will increase FOTO score to equal to or greater than  67%   to demonstrate statistically significant improvement in mobility and quality of life.  Baseline: 49 Goal status: INITIAL  2.  Patient will report a worst pain of 3/10 on VAS in  low back  to improve tolerance with ADLs and reduced symptoms with activities.  Baseline: 10/7: 8/10  Goal status: INITIAL  3. Patient will be modified independent in walking on even/uneven surface with least restrictive assistive device, for 20+ minutes without rest break, reporting some difficulty or less to improve walking tolerance with community ambulation including grocery shopping, going to church,etc.  Baseline: pain with walking   Goal status: INITIAL  4.  Patient (> 86 years old) will complete five times sit to stand test in < 15 seconds indicating an increased LE strength and improved balance. Baseline: 15.415 sec, slight increase in L hip pain  Goal status: INITIAL    PLAN:  PT FREQUENCY: 2x/week  PT DURATION: 8 weeks  PLANNED INTERVENTIONS: Therapeutic exercises, Therapeutic activity, Neuromuscular re-education, Balance training, Gait training, Patient/Family education, Self Care, Joint mobilization, Stair training, Vestibular training, Canalith repositioning, Visual/preceptual remediation/compensation, DME instructions, Dry Needling, Spinal mobilization, Cryotherapy, Moist heat, Splintting, Taping, Manual therapy, and Re-evaluation.  PLAN FOR NEXT SESSION:   pain reduction, piriformis stretching, core strengthening ,   Golden Pop, PT 07/16/2023, 2:52 PM

## 2023-07-19 ENCOUNTER — Ambulatory Visit: Payer: Medicare Other | Admitting: Physical Therapy

## 2023-07-20 ENCOUNTER — Ambulatory Visit: Payer: Medicare Other | Admitting: Physical Therapy

## 2023-07-20 DIAGNOSIS — M6281 Muscle weakness (generalized): Secondary | ICD-10-CM

## 2023-07-20 DIAGNOSIS — R293 Abnormal posture: Secondary | ICD-10-CM | POA: Diagnosis not present

## 2023-07-20 DIAGNOSIS — M5459 Other low back pain: Secondary | ICD-10-CM

## 2023-07-20 DIAGNOSIS — M47816 Spondylosis without myelopathy or radiculopathy, lumbar region: Secondary | ICD-10-CM | POA: Diagnosis not present

## 2023-07-20 DIAGNOSIS — R2689 Other abnormalities of gait and mobility: Secondary | ICD-10-CM

## 2023-07-20 DIAGNOSIS — M4316 Spondylolisthesis, lumbar region: Secondary | ICD-10-CM | POA: Diagnosis not present

## 2023-07-20 DIAGNOSIS — R278 Other lack of coordination: Secondary | ICD-10-CM | POA: Diagnosis not present

## 2023-07-20 DIAGNOSIS — M5416 Radiculopathy, lumbar region: Secondary | ICD-10-CM | POA: Diagnosis not present

## 2023-07-20 DIAGNOSIS — M48061 Spinal stenosis, lumbar region without neurogenic claudication: Secondary | ICD-10-CM | POA: Diagnosis not present

## 2023-07-20 NOTE — Therapy (Signed)
OUTPATIENT PHYSICAL THERAPY THORACOLUMBAR EVALUATION   Patient Name: Kristen Kirk MRN: [id number] DOB:10/06/1950, 72 y.o., female Today's Date: 07/20/2023  END OF SESSION:  PT End of Session - 07/20/23 0842     Visit Number 4    Number of Visits 16    Date for PT Re-Evaluation 09/03/23    PT Start Time 0845    PT Stop Time 0925    PT Time Calculation (min) 40 min    Equipment Utilized During Treatment Gait belt    Activity Tolerance Patient tolerated treatment well    Behavior During Therapy [facility] for tasks assessed/performed             Past Medical History:  Diagnosis Date   Anemia in chronic kidney disease    a. s/p aranesp 11/2013 now starting epo with HD 06/2014   Diabetes mellitus type II    a. Currently diet controlled since losing weight.   DJD (degenerative joint disease)    Essential hypertension    GERD (gastroesophageal reflux disease)    Glaucoma, both eyes    a. L>R   History of echocardiogram    a. 01/2015 Echo [facility]): EF ?55%, triv MR, mildly dil LA, nl PV.   History of end stage renal disease    previous on HD;   received kidney transplant 07/ 2018 (on dialysis since 2015 MWF)  followed by [facility] renal (Dr. Linus Salmons) considering transplant Access: LUE fistula Establishing with [facility] [facility]   History of stress test    a. 03/2013 [facility]): EF 63%, no ischemia.   History of syncope    a. 12/2015.   History of Urge Incontinence    HLD (hyperlipidemia)    Osteoarthritis    Osteoporosis 05/03/2019   DEXA 04/2019 - 1.5 spine, -2.1 hip, -2.5 forearm Check phosphate and PTH to guide further management in h/o kidney transplant   S/P kidney transplant 04/2017   @[facility] ---   per pt one kidney from living-donor   Vaginal atrophy    a. vagifem caused spotting, normal TVS   Vitamin D deficiency    Wears glasses    stated on 05/31/2022   Wears partial dentures    upper 2 teeth. 05/31/2022   Past Surgical History:  Procedure Laterality Date   ANAL  RECTAL MANOMETRY N/A 03/16/2021   Procedure: ANO RECTAL MANOMETRY;  Surgeon: Napoleon Form, MD;  Location: [facility] ENDOSCOPY;  Service: Endoscopy;  Laterality: N/A;   BLADDER SUSPENSION  2011   for stress incontinence-Dr. MacDiarmid   BREAST BIOPSY Right 12/15/2020   fat necrosis   CATARACT EXTRACTION W/ INTRAOCULAR LENS IMPLANT Bilateral    left 02-25-2021;  right ?   COLONOSCOPY  03/2019   diverticulosis, rpt 10 yrs (Nandigam)   KIDNEY TRANSPLANT Right 04/03/2017   [facility])   left eye laser surgery  06/2011   LIGATION OF ARTERIOVENOUS  FISTULA  10/2017   thrombosed L arm AFV   NEPHRECTOMY TRANSPLANTED ORGAN     REFRACTIVE SURGERY     TUBAL LIGATION Bilateral    1990s   Patient Active Problem List   Diagnosis Date Noted   Skin lesion of breast 01/02/2023   Dark stools 08/08/2022   Acute renal failure superimposed on stage 3a chronic kidney disease (HCC) 07/09/2022   Anemia in chronic kidney disease    Essential hypertension    UTI (urinary tract infection)    Type II diabetes mellitus with renal manifestations (HCC)    Immunosuppressed status (HCC) 04/15/2022  Health maintenance examination 04/14/2022   Blurry vision 04/08/2021   Constipation due to outlet dysfunction    Acquired leg length discrepancy 01/28/2021   ASCUS of cervix with negative high risk HPV 10/01/2020   Thoracic spine pain 01/05/2020   Chronic lower back pain 10/08/2019   Osteoporosis 05/03/2019   Advanced care planning/counseling discussion 03/20/2019   MCI (mild cognitive impairment) with memory loss 03/20/2019   Right shoulder pain 06/19/2017   History of renal transplant 04/01/2017   Incontinence of feces 01/11/2017    Class: Acute   Insomnia 01/14/2013   Xerostomia 01/29/2012   Medicare annual wellness visit, subsequent 07/20/2011   Vaginal atrophy 04/17/2011   Type 2 diabetes mellitus with other specified complication (HCC)    Hyperlipidemia associated with type 2 diabetes mellitus (HCC)     GERD (gastroesophageal reflux disease)    Mixed incontinence urge and stress    Vitamin D deficiency 05/19/2009   MUSCLE CRAMPS 02/22/2009   Alopecia 10/05/2008   Vertigo 07/31/2008   Osteoarthritis 01/03/2007   Primary open angle glaucoma (POAG) of both eyes, severe stage 10/03/1999    PCP: Eustaquio Boyden MD  REFERRING PROVIDER: Liana Gerold MD   REFERRING DIAG: lumbar radiculopathy, lumbar spondylosis, spondylolisthesis of lumbar spine, spinal stenosis of lumbar region  Rationale for Evaluation and Treatment: Rehabilitation  THERAPY DIAG:  Other low back pain  Other abnormalities of gait and mobility  Abnormal posture  Muscle weakness (generalized)  Other lack of coordination  ONSET DATE: 1-2 weeks ago new pain began.   SUBJECTIVE:                                                                                                                                                                                           SUBJECTIVE STATEMENT: Pt states that she is doing okay today. Some mild pain in the back of the L hip this morning.   PERTINENT HISTORY:  Patient presents to PT for evaluation of lumbar radiculopathy, lumbar spondylosis, spondylolisthesis of lumbar spine, spinal stenosis of lumbar region. Pmh includes renal transplant (2018), HTN, HLD, glaucoma, CKD, DM, osteoporosis, anemia, DM type II, DJD, GERD, vaginal atrophy, . She has a sacral stimulator. Per physician note she has one month of constant lateral left leg pain to her knee with pain worsening in the morning and with walking. In 2021 had L L5-S1 TF ESI. Patient reports pain is primarily in L hip, reports she was given a shot. Reports pain goes to back of her knee.   PAIN:  Are you having pain? Yes: NPRS scale: 4/10 Pain location: L hip region  Pain description: sore Aggravating factors:  sitting down a long time and trying to get up, walk distance Relieving factors: laying down sleeping   Worst  Pain: 8/10  Least pain: 3-4/10   PRECAUTIONS: None  RED FLAGS: Bowel or bladder incontinence: Yes: fecal incontinence     WEIGHT BEARING RESTRICTIONS: No  FALLS:  Has patient fallen in last 6 months? No  LIVING ENVIRONMENT: Lives with: lives with their spouse Lives in: House/apartment Stairs:  basement downstairs,  two stories, has a chair lift  Has following equipment at home:  chair lift   OCCUPATION: retired   PLOF: Independent  PATIENT GOALS: to reduce pain     OBJECTIVE:  Note: Objective measures were completed at Evaluation unless otherwise noted.  DIAGNOSTIC FINDINGS:  EXAM: CT LUMBAR SPINE WITHOUT CONTRAST   TECHNIQUE: Multidetector CT imaging of the lumbar spine was performed without intravenous contrast administration. Multiplanar CT image reconstructions were also generated.   RADIATION DOSE REDUCTION: This exam was performed according to the departmental dose-optimization program which includes automated exposure control, adjustment of the mA and/or kV according to patient size and/or use of iterative reconstruction technique.   COMPARISON:  01/13/2020   FINDINGS: Segmentation: 5 lumbar type vertebrae.   Alignment: Grade 1 anterolisthesis of L4 on L5 secondary to facet disease. 2 mm retrolisthesis of L2 on L3 and L1 on L2.   Vertebrae: No acute fracture or aggressive osseous lesion.   Paraspinal and other soft tissues: Abdominal aortic atherosclerosis. Bilateral renal atrophy.   Other: None   Disc levels:   Disc spaces: Degenerative disease with disc height loss at T11-12, L1-2, L2-3, L4-5 and L5-S1.   T12-L1: Mild broad-based disc bulge. Mild bilateral facet arthropathy. No foraminal or central canal stenosis.   L1-L2: Broad-based disc bulge. Moderate bilateral facet arthropathy. Mild right foraminal stenosis. No left foraminal stenosis. No spinal stenosis.   L2-L3: Broad-based disc bulge. Mild bilateral facet arthropathy. Moderate  spinal stenosis. Bilateral subarticular recess stenosis. Moderate bilateral foraminal stenosis.   L3-L4: Broad-based disc bulge. Moderate bilateral facet arthropathy. Severe spinal stenosis. Severe bilateral foraminal stenosis.   L4-L5: Broad-based disc bulge. Severe bilateral facet arthropathy. Mild bilateral foraminal stenosis. Severe spinal stenosis.   L5-S1: Broad-based disc bulge. Severe left and mild right facet arthropathy. Severe bilateral foraminal stenosis.   IMPRESSION: 1. Lumbar spine spondylosis as described above. 2. No acute osseous injury of the lumbar spine. 3.  Aortic Atherosclerosis (ICD10-I70.0).    PATIENT SURVEYS:  FOTO 49  SCREENING FOR RED FLAGS: Bowel or bladder incontinence: Yes:     COGNITION: Overall cognitive status: Within functional limits for tasks assessed     SENSATION: Decreased R adductor region   MUSCLE LENGTH:  Thomas test limited bilaterally   POSTURE: anterior pelvic tilt  PALPATION: Significant tension of L piriformis and quadratus lumborum   LUMBAR ROM:   AROM eval  Flexion 54  Extension 6  Right lateral flexion Limited 50%  Left lateral flexion Limited 50 %  Right rotation Limited 50%  Left rotation Limited 50%    (Blank rows = not tested)    LOWER EXTREMITY MMT:    MMT Right eval Left eval  Hip flexion 4+ 4+  Hip extension    Hip abduction 4+ 4+  Hip adduction 4+ 4+  Hip internal rotation    Hip external rotation    Knee flexion 5 5  Knee extension 4+ 4+  Ankle dorsiflexion 5 5  Ankle plantarflexion 4+ 4+  Ankle inversion    Ankle eversion     (  Blank rows = not tested)  LUMBAR SPECIAL TESTS:  Straight leg raise test: Positive, SI Compression/distraction test: Positive, and FABER test: Negative  FUNCTIONAL TESTS:  5 times sit to stand: 15.415 10 meter walk test: 10.94sec 0.961m/s     GAIT: Distance walked: 30 ft Assistive device utilized: None Level of assistance: Complete  Independence Comments: antalgic gait pattern   TODAY'S TREATMENT:                                                                                                                              DATE: 07/20/23  Nustep level 1-2 x 5 min  Standing hip abduction RTB x 10 bil  Standing hip extension RTB x 10 bil   Seated therapy ball roll out forward x 12  diagonal therapy ball  x 12 bil  Trunk rotation in sitting x 15 bil  Overhead PVC pipe raise with bolsterl behind back x 12 Low row with slight trunk extension holding PVC pipe x 12 GTB   Standing modified plank against wall x 30 sec.  Wall plank with alternating shoulder tap x 10 bil   Supine:  HS stretch relax 30 sec contract x 6 sec, x 3 bouts  Piriformis stretch x 2 x 30 sec.    Cues for full ROM and proper hold at end range as well as decreased speed of eccentric movement.     PATIENT EDUCATION:  Education details: goals, POC, HEP . Pt educated throughout session about proper posture and technique with exercises. Improved exercise technique, movement at target joints, use of target muscles after min to mod verbal, visual, tactile cues.  Person educated: Patient Education method: Explanation, Demonstration, Tactile cues, and Verbal cues Education comprehension: verbalized understanding, returned demonstration, verbal cues required, and tactile cues required  HOME EXERCISE PROGRAM: Access Code: [facility] URL: [email or web address] Prepared by: Precious Bard  Exercises - Supine Lower Trunk Rotation  - 7 x weekly - 1 x daily - 2 sets - 10 reps - 5 hold - Supine Posterior Pelvic Tilt  - 7 x weekly - 1 x daily - 2 sets - 10 reps - 5 hold - Supine Piriformis Stretch with Foot on Ground  - 1 x daily - 7 x weekly - 2 sets - 1 reps - 30 hold     ASSESSMENT:  CLINICAL IMPRESSION: Pt missed appointment yesterday, but able to come today. Pt arrived motivated to participate. PT treatment instructed pt in BLE/core  stability therex on this day as well as ROM and improved muscle extensibility in bil glutes/piriformis. Mild decreased in L hip pain at end of treatment session. Patient will continue benefit from skilled physical therapy to reduce pain and improve mobility to return to PLOF.   OBJECTIVE IMPAIRMENTS: Abnormal gait, decreased activity tolerance, decreased endurance, decreased mobility, difficulty walking, decreased ROM, decreased strength, hypomobility, increased fascial restrictions, impaired perceived functional ability, impaired flexibility, impaired vision/preception, improper body mechanics, postural dysfunction, and pain.  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   REHAB POTENTIAL: Fair    CLINICAL DECISION MAKING: Evolving/moderate complexity  EVALUATION COMPLEXITY: Moderate   GOALS: Goals reviewed with patient? Yes  SHORT TERM GOALS: Target date: 08/06/2023    Patient will be independent in home exercise program to improve strength/mobility for better functional independence with ADLs. Baseline:10/7 HEP given  Goal status: INITIAL    LONG TERM GOALS: Target date: 09/03/2023    Patient will increase FOTO score to equal to or greater than  67%   to demonstrate statistically significant improvement in mobility and quality of life.  Baseline: 49 Goal status: INITIAL  2.  Patient will report a worst pain of 3/10 on VAS in  low  back  to improve tolerance with ADLs and reduced symptoms with activities.  Baseline: 10/7: 8/10  Goal status: INITIAL  3. Patient will be modified independent in walking on even/uneven surface with least restrictive assistive device, for 20+ minutes without rest break, reporting some difficulty or less to improve walking tolerance with community ambulation including grocery shopping, going to church,etc.  Baseline: pain with walking  Goal status: INITIAL  4.  Patient (> 85 years old) will complete five times sit to stand test in < 15 seconds indicating an increased LE strength and improved balance. Baseline: 15.415 sec, slight increase in L hip pain  Goal status: INITIAL    PLAN:  PT FREQUENCY: 2x/week  PT DURATION: 8 weeks  PLANNED INTERVENTIONS: Therapeutic exercises, Therapeutic activity, Neuromuscular re-education, Balance training, Gait training, Patient/Family education, Self Care, Joint mobilization, Stair training, Vestibular training, Canalith repositioning, Visual/preceptual remediation/compensation, DME instructions, Dry Needling, Spinal mobilization, Cryotherapy, Moist heat, Splintting, Taping, Manual therapy, and Re-evaluation.  PLAN FOR NEXT SESSION:   pain reduction, piriformis stretching, core strengthening   Golden Pop, PT 07/20/2023, 8:43 AM

## 2023-07-25 ENCOUNTER — Ambulatory Visit: Payer: Medicare Other

## 2023-07-25 ENCOUNTER — Telehealth: Payer: Self-pay

## 2023-07-25 NOTE — Telephone Encounter (Signed)
Patient Name: Kristen Kirk MRN: [id number] DOB:02-18-1951, 72 y.o., female Today's Date: 07/25/2023  Phone call at 11:05am on 07/25/2023. Spoke with patient on her home number. Informed her of missed visit earlier today at 10:15 AM. Patient reported she mixed up her days and thought her appointment was tomorrow. Instructed her on next visit time- 08/01/2023 at 10:15 and she reported she had a conflict and has a medical appointment that day at 10:30 and would need to cancel PT appt. Instructed her of next appointment would be 08/07/2023 at 11 am with Precious Bard, PT, DPT and she stated she would be able to make that appointment.     Lenda Kelp, PT 07/25/2023, 11:20 AM

## 2023-07-26 ENCOUNTER — Other Ambulatory Visit: Payer: Self-pay | Admitting: Family Medicine

## 2023-07-26 DIAGNOSIS — K219 Gastro-esophageal reflux disease without esophagitis: Secondary | ICD-10-CM

## 2023-07-31 ENCOUNTER — Ambulatory Visit (INDEPENDENT_AMBULATORY_CARE_PROVIDER_SITE_OTHER): Payer: Medicare Other

## 2023-07-31 VITALS — Ht 63.0 in | Wt 132.0 lb

## 2023-07-31 DIAGNOSIS — Z1231 Encounter for screening mammogram for malignant neoplasm of breast: Secondary | ICD-10-CM | POA: Diagnosis not present

## 2023-07-31 DIAGNOSIS — Z0000 Encounter for general adult medical examination without abnormal findings: Secondary | ICD-10-CM | POA: Diagnosis not present

## 2023-07-31 NOTE — Progress Notes (Signed)
Subjective:   Kristen Kirk is a 72 y.o. female who presents for Medicare Annual (Subsequent) preventive examination.  Visit Complete: Virtual I connected with  Kristen Kirk on 07/31/23 by a audio enabled telemedicine application and verified that I am speaking with the correct person using two identifiers.  Patient Location: Home  Provider Location: Office/Clinic  I discussed the limitations of evaluation and management by telemedicine. The patient expressed understanding and agreed to proceed.  Vital Signs: Because this visit was a virtual/telehealth visit, some criteria may be missing or patient reported. Any vitals not documented were not able to be obtained and vitals that have been documented are patient reported.  Patient Medicare AWV questionnaire was completed by the patient on (not done); I have confirmed that all information answered by patient is correct and no changes since this date.     Objective:    Today's Vitals   07/31/23 1438 07/31/23 1439  Weight: 132 lb (59.9 kg)   Height: 5\' 3"  (1.6 m)   PainSc:  8    Body mass index is 23.38 kg/m.     07/31/2023    2:58 PM 07/09/2023    3:39 PM 07/09/2022    6:42 PM 07/08/2022    9:03 PM 06/29/2022    8:54 AM 06/16/2022    8:08 AM 04/12/2022   10:36 AM  Advanced Directives  Does Patient Have a Medical Advance Directive? No No No No No No No  Would patient like information on creating a medical advance directive?   No - Patient declined  No - Patient declined Yes (Inpatient - patient defers creating a medical advance directive at this time - Information given) No - Patient declined    Current Medications (verified) Outpatient Encounter Medications as of 07/31/2023  Medication Sig   acetaminophen (TYLENOL) 500 MG tablet Take 1 tablet (500 mg total) by mouth 3 (three) times daily as needed for moderate pain.   alendronate (FOSAMAX) 70 MG tablet Take 1 tablet (70 mg total) by mouth every 7 (seven) days. Take with  a full glass of water on an empty stomach.   amLODipine (NORVASC) 2.5 MG tablet Take 2.5 mg by mouth daily.   apraclonidine (IOPIDINE) 0.5 % ophthalmic solution Place 1 drop into the right eye daily.   aspirin 81 MG tablet Take 81 mg by mouth daily.   Blood Glucose Monitoring Suppl (ONE TOUCH ULTRA SYSTEM KIT) W/DEVICE KIT 1 kit by Does not apply route once.   carvedilol (COREG) 25 MG tablet Take 12.5 mg by mouth 2 (two) times daily with a meal.   Cholecalciferol (VITAMIN D3) 1000 units CAPS Take 2 capsules (2,000 Units total) by mouth daily.   docusate sodium (COLACE) 100 MG capsule Take 100 mg by mouth 2 (two) times daily as needed for mild constipation or moderate constipation.   dorzolamide-timolol (COSOPT) 2-0.5 % ophthalmic solution Apply to eye.   famotidine (PEPCID) 20 MG tablet Take 1 tablet (20 mg total) by mouth at bedtime.   glucose blood test strip Check sugars once daily   hydrochlorothiazide (HYDRODIURIL) 12.5 MG tablet Take 1 tablet (12.5 mg total) by mouth daily.   ketorolac (ACULAR) 0.5 % ophthalmic solution Place 1 drop into the left eye 4 (four) times daily.   latanoprost (XALATAN) 0.005 % ophthalmic solution Place 1 drop into the right eye at bedtime.   Melatonin 3 MG CAPS Take 1-2 capsules (3-6 mg total) by mouth at bedtime as needed (sleep).   memantine (NAMENDA)  5 MG tablet Take 1 tablet (5 mg total) by mouth daily.   Multiple Vitamin (MULTIVITAMIN) tablet Take 1 tablet by mouth daily.   mycophenolate (CELLCEPT) 500 MG tablet Take 500 mg by mouth 2 (two) times daily.   NEOMYCIN-POLYMYXIN-HYDROCORTISONE (CORTISPORIN) 1 % SOLN OTIC solution Apply 1-2 drops to toe BID after soaking   pantoprazole (PROTONIX) 20 MG tablet Take 1 tablet by mouth once daily   pravastatin (PRAVACHOL) 80 MG tablet Take 1 tablet (80 mg total) by mouth every evening.   prednisoLONE acetate (PRED FORTE) 1 % ophthalmic suspension Place 1 drop into the left eye every 2 (two) hours while awake.    tacrolimus (PROGRAF) 0.5 MG capsule Take 0.5-1 mg by mouth See admin instructions. Take 2 capsules (1mg ) by mouth every morning and take 1 capsule (0.5mg ) by mouth every night   tiZANidine (ZANAFLEX) 2 MG tablet Take 1 tablet (2 mg total) by mouth 3 times/day as needed-between meals & bedtime for muscle spasms.   No facility-administered encounter medications on file as of 07/31/2023.    Allergies (verified) Brimonidine, Shellfish-derived products, and Tramadol   History: Past Medical History:  Diagnosis Date   Anemia in chronic kidney disease    a. s/p aranesp 11/2013 now starting epo with HD 06/2014   Diabetes mellitus type II    a. Currently diet controlled since losing weight.   DJD (degenerative joint disease)    Essential hypertension    GERD (gastroesophageal reflux disease)    Glaucoma, both eyes    a. L>R   History of echocardiogram    a. 01/2015 Echo [facility]): EF ?55%, triv MR, mildly dil LA, nl PV.   History of end stage renal disease    previous on HD;   received kidney transplant 07/ 2018 (on dialysis since 2015 MWF)  followed by [facility] renal (Dr. Linus Salmons) considering transplant Access: LUE fistula Establishing with [facility]   History of stress test    a. 03/2013 Myoview [facility]): EF 63%, no ischemia.   History of syncope    a. 12/2015.   History of Urge Incontinence    HLD (hyperlipidemia)    Osteoarthritis    Osteoporosis 05/03/2019   DEXA 04/2019 - 1.5 spine, -2.1 hip, -2.5 forearm Check phosphate and PTH to guide further management in h/o kidney transplant   S/P kidney transplant 04/2017   @[facility] ---   per pt one kidney from living-donor   Vaginal atrophy    a. vagifem caused spotting, normal TVS   Vitamin D deficiency    Wears glasses    stated on 05/31/2022   Wears partial dentures    upper 2 teeth. 05/31/2022   Past Surgical History:  Procedure Laterality Date   ANAL RECTAL MANOMETRY N/A 03/16/2021   Procedure: ANO RECTAL MANOMETRY;  Surgeon: Napoleon Form, MD;  Location: [facility];  Service: Endoscopy;  Laterality: N/A;   BLADDER SUSPENSION  2011   for stress incontinence-Dr. MacDiarmid   BREAST BIOPSY Right 12/15/2020   fat necrosis   CATARACT EXTRACTION W/ INTRAOCULAR LENS IMPLANT Bilateral    left 02-25-2021;  right ?   COLONOSCOPY  03/2019   diverticulosis, rpt 10 yrs (Nandigam)   KIDNEY TRANSPLANT Right 04/03/2017   [facility])   left eye laser surgery  06/2011   LIGATION OF ARTERIOVENOUS  FISTULA  10/2017   thrombosed L arm AFV   NEPHRECTOMY TRANSPLANTED ORGAN     REFRACTIVE SURGERY     TUBAL LIGATION Bilateral  1990s   Family History  Problem Relation Age of Onset   Other Father        she did not know her father.   Diabetes Mother    Heart failure Mother        died @ 53   Stroke Brother    Hyperlipidemia Brother    Hypertension Brother    Hyperlipidemia Brother    Hypertension Brother    Hyperlipidemia Sister    Hypertension Sister    Hyperlipidemia Sister    Hypertension Sister    Heart failure Maternal Grandfather    Breast cancer Neg Hx    Colon cancer Neg Hx    Esophageal cancer Neg Hx    Stomach cancer Neg Hx    Rectal cancer Neg Hx    Social History   Socioeconomic History   Marital status: Married    Spouse name: Not on file   Number of children: 1   Years of education: Not on file   Highest education level: Not on file  Occupational History   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  Vaping Use   Vaping status: Never Used  Substance and Sexual Activity   Alcohol use: No    Alcohol/week: 0.0 standard drinks of alcohol   Drug use: No   Sexual activity: Yes    Partners: Male  Other Topics Concern   Not on file  Social History Narrative    Lives with husband in [place].   Grown children - daughter in [place]   Occupation: Engineer, mining at [employer], 3rd shift   Activity: no regular exercise - goes to gym   Diet: good water, fruits/vegetables daily   Social Determinants of Health   Financial Resource Strain: Low Risk  (07/31/2023)   Overall Financial Resource Strain (CARDIA)    Difficulty of Paying Living Expenses: Not hard at all  Food Insecurity: No Food Insecurity (07/31/2023)   Hunger Vital Sign    Worried About Running Out of Food in the Last Year: Never true    Ran Out of Food in the Last Year: Never true  Transportation Needs: No Transportation Needs (07/31/2023)   PRAPARE - Administrator, Civil Service (Medical): No    Lack of Transportation (Non-Medical): No  Physical Activity: Inactive (07/31/2023)   Exercise Vital Sign    Days of Exercise per Week: 0 days    Minutes of Exercise per Session: 0 min  Stress: No Stress Concern Present (07/31/2023)   [employer] of Occupational Health - Occupational Stress Questionnaire    Feeling of Stress : Only a little  Social Connections: Moderately Integrated (07/31/2023)   Social Connection and Isolation Panel [NHANES]    Frequency of Communication with Friends and Family: More than three times a week    Frequency of Social Gatherings with Friends and Family: More than three times a week    Attends Religious Services: More than 4 times per year    Active Member of [employer] or Organizations: No    Attends Banker Meetings: Never    Marital Status: Married    Tobacco Counseling Counseling given: Not Answered Tobacco comments: Remote- quit ~ late 1980's.  Clinical Intake:  Pre-visit preparation completed: No  Pain : 0-10 Pain Score: 8  Pain Type: Chronic pain Pain Location: Hip Pain Orientation: Left Pain Descriptors / Indicators: Aching, Shooting (left foot and toes pain radiating from hip) Pain Onset: More than a month  ago Pain Frequency: Intermittent Pain Relieving Factors: injection, medication  Pain Relieving Factors: injection, medication  BMI - recorded: 23.38 Nutritional Status: BMI of 19-24  Normal Nutritional Risks: None Diabetes: Yes CBG done?: No CBG resulted in Enter/ Edit results?: No Did pt. bring in CBG monitor from home?: No  How often do you need to have someone help you when you read instructions, pamphlets, or other written materials from your doctor or pharmacy?: 1 - Never  Interpreter Needed?: No  Comments: lives with husband Information entered by :: B.Juwon Scripter,LPN   Activities of Daily Living    07/31/2023    2:58 PM  In your present state of health, do you have any difficulty performing the following activities:  Hearing? 0  Vision? 1  Difficulty concentrating or making decisions? 1  Walking or climbing stairs? 1  Dressing or bathing? 0  Doing errands, shopping? 0  Preparing Food and eating ? N  Using the Toilet? N  In the past six months, have you accidently leaked urine? N  Do you have problems with loss of bowel control? N  Managing your Medications? N  Housekeeping or managing your Housekeeping? N    Patient Care Team: Eustaquio Boyden, MD as PCP [facility], [facility]  Indicate any recent Medical Services you may have received from other than [facility] providers in the past year (date may be approximate).     Assessment:   This is a routine wellness examination for Kristen Kirk.  Hearing/Vision screen Hearing Screening - Comments:: Pt says her hearing is good Vision Screening - Comments:: Pt says her vision is &quot;not worth five cent:has glaucoma.She says vision is blurry and cannot drive at night;on 4 eye drops [facility]   Goals Addressed               This Visit's Progress     DIET - EAT MORE FRUITS AND VEGETABLES (pt-stated)   Not on track     Still working on fruit       Depression Screen    07/31/2023    2:55 PM  05/15/2023    9:27 AM 03/21/2023    3:06 PM 01/17/2023    8:58 AM 01/01/2023    4:20 PM 10/17/2022    9:12 AM 04/12/2022   10:34 AM  PHQ 2/9 Scores  PHQ - 2 Score 0 0 0 0 0 0 0  PHQ- 9 Score  1 1 2 1 3  0    Fall Risk    07/31/2023    2:46 PM 05/15/2023    9:27 AM 03/21/2023    3:06 PM 01/17/2023    8:58 AM 01/01/2023    4:20 PM  Fall Risk   Falls in the past year? 0 0 0 1 0  Number falls in past yr: 0   0 0  Injury with Fall? 0   0 0  Risk for fall due to : No Fall Risks      Follow up Education provided;Falls prevention discussed        MEDICARE RISK AT HOME: Medicare Risk at Home Any stairs in or around the home?: Yes If so, are there any without handrails?: Yes Home free of loose throw rugs  in walkways, pet beds, electrical cords, etc?: Yes Adequate lighting in your home to reduce risk of falls?: Yes Life alert?: No Use of a cane, walker or w/c?: Yes (cane) Grab bars in the bathroom?: Yes Shower chair or bench in shower?: Yes (does not use:not in bathroom) Elevated toilet seat or a handicapped toilet?: No  TIMED UP AND GO:  Was the test performed?  No    Cognitive Function:        07/31/2023    3:00 PM  6CIT Screen  What Year? 0 points  What month? 0 points  What time? 0 points  Count back from 20 0 points  Months in reverse 2 points  Repeat phrase 0 points  Total Score 2 points    Immunizations Immunization History  Administered Date(s) Administered   Fluad Quad(high Dose 65+) 07/07/2020, 07/04/2022   Influenza Whole 06/16/2010, 07/15/2013   Influenza, High Dose Seasonal PF 06/28/2019, 07/19/2021   Influenza,inj,Quad PF,6+ Mos 07/02/2018   Influenza-Unspecified 07/02/2014, 07/03/2015, 10/06/2016   Moderna Covid-19 Fall Seasonal Vaccine 24yrs & older 11/02/2022   PFIZER(Purple Top)SARS-COV-2 Vaccination 11/14/2019, 12/05/2019, 06/14/2020   PPD Test 03/19/2012, 04/09/2012, 07/09/2013   Pneumococcal Conjugate-13 03/20/2019   Pneumococcal Polysaccharide-23  01/29/2012, 09/29/2020   Td 10/30/2007   Zoster Recombinant(Shingrix) 07/03/2018, 10/28/2020    TDAP status: Up to date  Flu Vaccine status: Due, Education has been provided regarding the importance of this vaccine. Advised may receive this vaccine at local pharmacy or Health Dept. Aware to provide a copy of the vaccination record if obtained from local pharmacy or Health Dept. Verbalized acceptance and understanding.  Pneumococcal vaccine status: Up to date  Covid-19 vaccine status: Completed vaccines  Qualifies for Shingles Vaccine? Yes   Zostavax completed Yes   Shingrix Completed?: Yes  Screening Tests Health Maintenance  Topic Date Due   Diabetic kidney evaluation - Urine ACR  09/23/2021   MAMMOGRAM  01/17/2023   FOOT EXAM  07/18/2023   HEMOGLOBIN A1C  07/19/2023   COVID-19 Vaccine (5 - 2023-24 season) 08/16/2023 (Originally 06/03/2023)   INFLUENZA VACCINE  12/31/2023 (Originally 05/03/2023)   DTaP/Tdap/Td (2 - Tdap) 07/30/2024 (Originally 10/29/2017)   Diabetic kidney evaluation - eGFR measurement  01/01/2024   OPHTHALMOLOGY EXAM  07/08/2024   Medicare Annual Wellness (AWV)  07/30/2024   Colonoscopy  03/24/2029   Pneumonia Vaccine 54+ Years old  Completed   DEXA SCAN  Completed   Hepatitis C Screening  Completed   Zoster Vaccines- Shingrix  Completed   HPV VACCINES  Aged Out    Health Maintenance  Health Maintenance Due  Topic Date Due   Diabetic kidney evaluation - Urine ACR  09/23/2021   MAMMOGRAM  01/17/2023   FOOT EXAM  07/18/2023   HEMOGLOBIN A1C  07/19/2023    Colorectal cancer screening: Type of screening: Colonoscopy. Completed 03/25/2019. Repeat every 10 years  Mammogram status: Completed 01/17/23. Repeat every year  Bone Density status: Completed 07/26/22. Results reflect: Bone density results: OSTEOPOROSIS. Repeat every 3 years.  Lung Cancer Screening: (Low Dose CT Chest recommended if Age 45-80 years, 20 pack-year currently smoking OR have quit w/in  15years.) does not qualify.   Lung Cancer Screening Referral: no  Additional Screening:  Hepatitis C Screening: does not qualify; Completed 03/28/2016  Vision Screening: Recommended annual ophthalmology exams for early detection of glaucoma and other disorders of the eye. Is the patient up to date with their annual eye exam?  Yes  Who is the provider or what is the name  of the office in which the patient attends annual eye exams? [facility] If pt is not established with a provider, would they like to be referred to a provider to establish care? No .   Dental Screening: Recommended annual dental exams for proper oral hygiene  Diabetic Foot Exam: Diabetic Foot Exam: Completed 10/23  Community Resource Referral / Chronic Care Management: CRR required this visit?  No   CCM required this visit?  No    Plan:    I have personally reviewed and noted the following in the patient's chart:   Medical and social history Use of alcohol, tobacco or illicit drugs  Current medications and supplements including opioid prescriptions. Patient is not currently taking opioid prescriptions. Functional ability and status Nutritional status Physical activity Advanced directives List of other physicians Hospitalizations, surgeries, and ER visits in previous 12 months Vitals Screenings to include cognitive, depression, and falls Referrals and appointments  In addition, I have reviewed and discussed with patient certain preventive protocols, quality metrics, and best practice recommendations. A written personalized care plan for preventive services as well as general preventive health recommendations were provided to patient.    Sue Lush, LPN   [id number]   After Visit Summary: (Declined) Due to this being a telephonic visit, with patients personalized plan was offered to patient but patient Declined AVS at this time   Nurse Notes: Pt says her eye sight is not good and her husband no longer  drives. She indicates she is going to ortho tomorrow for injection in her hip. No questions or concerns voiced at this time  *MMG order placed

## 2023-07-31 NOTE — Patient Instructions (Addendum)
Ms. Wisener , Thank you for taking time to come for your Medicare Wellness Visit. I appreciate your ongoing commitment to your health goals. Please review the following plan we discussed and let me know if I can assist you in the future.   Referrals/Orders/Follow-Ups/Clinician Recommendations:   You have an order for:  []   2D Mammogram  [x]   3D Mammogram  []   Bone Density     Please call for appointment:  [facility]  [address] [place] [address] [phone]  [facility] [address] [place], [place] [address] [phone]  [facility] [address] Geanie [place], [place] [address] [phone]    Make sure to wear two-piece clothing.  No lotions, powders, or deodorants the day of the appointment. Make sure to bring picture ID and insurance card.  Bring list of medications you are currently taking including any supplements.   Schedule your [place] screening mammogram through MyChart!   Log into your MyChart account.  Go to 'Visit' (or 'Appointments' if on mobile App) --> Schedule an Appointment  Under 'Select a Reason for Visit' choose the Mammogram Screening option.  Complete the pre-visit questions and select the time and place that best fits your schedule.    This is a list of the screening recommended for you and due dates:  Health Maintenance  Topic Date Due   Eye exam for diabetics  02/24/2015   DTaP/Tdap/Td vaccine (2 - Tdap) 10/29/2017   Yearly kidney health urinalysis for diabetes  09/23/2021   Mammogram  01/17/2023   Flu Shot  05/03/2023   COVID-19 Vaccine (5 - 2023-24 season) 06/03/2023   Complete foot exam   07/18/2023   Hemoglobin A1C  07/19/2023   Yearly kidney function blood test for diabetes  01/01/2024   Medicare Annual Wellness Visit  07/30/2024   Colon Cancer Screening  03/24/2029   Pneumonia  Vaccine  Completed   DEXA scan (bone density measurement)  Completed   Hepatitis C Screening  Completed   Zoster (Shingles) Vaccine  Completed   HPV Vaccine  Aged Out    Advanced directives: (Declined) Advance directive discussed with you today. Even though you declined this today, please call our office should you change your mind, and we can give you the proper paperwork for you to fill out.  Next Medicare Annual Wellness Visit scheduled for next year: Yes 08/05/23 @10 :10am telephone

## 2023-08-01 ENCOUNTER — Ambulatory Visit: Payer: Medicare Other

## 2023-08-01 DIAGNOSIS — M47816 Spondylosis without myelopathy or radiculopathy, lumbar region: Secondary | ICD-10-CM | POA: Diagnosis not present

## 2023-08-02 DIAGNOSIS — H18232 Secondary corneal edema, left eye: Secondary | ICD-10-CM | POA: Insufficient documentation

## 2023-08-02 DIAGNOSIS — Z940 Kidney transplant status: Secondary | ICD-10-CM | POA: Diagnosis not present

## 2023-08-06 ENCOUNTER — Other Ambulatory Visit: Payer: Self-pay | Admitting: Podiatry

## 2023-08-06 ENCOUNTER — Encounter: Payer: Self-pay | Admitting: Podiatry

## 2023-08-06 ENCOUNTER — Ambulatory Visit (INDEPENDENT_AMBULATORY_CARE_PROVIDER_SITE_OTHER): Payer: Medicare Other

## 2023-08-06 ENCOUNTER — Ambulatory Visit: Payer: Medicare Other | Admitting: Podiatry

## 2023-08-06 DIAGNOSIS — M7752 Other enthesopathy of left foot: Secondary | ICD-10-CM | POA: Diagnosis not present

## 2023-08-06 DIAGNOSIS — M2042 Other hammer toe(s) (acquired), left foot: Secondary | ICD-10-CM

## 2023-08-06 DIAGNOSIS — G5792 Unspecified mononeuropathy of left lower limb: Secondary | ICD-10-CM

## 2023-08-06 MED ORDER — TRIAMCINOLONE ACETONIDE 40 MG/ML IJ SUSP
60.0000 mg | Freq: Once | INTRAMUSCULAR | Status: AC
Start: 2023-08-06 — End: 2023-08-06
  Administered 2023-08-06: 60 mg

## 2023-08-06 NOTE — Progress Notes (Signed)
She presents today states that the top of her toes on her left foot pain across the tops of the toes when she is wearing socks or shoes.  Objective: Vital signs stable alert oriented x 3 is no erythema Dem salines drainage odor no reproducible pain on palpation of the toes.  Toes are not stiff.  Radiographically the joint spaces are starting to narrow but there is no spurring.  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  Plan: I injected the second third and fourth interdigital space today with small amount of Kenalog and local anesthetic.  I will follow-up with her as needed

## 2023-08-06 NOTE — Therapy (Signed)
OUTPATIENT PHYSICAL THERAPY THORACOLUMBAR TREATMENT   Patient Name: Kristen Kirk MRN: [id number] DOB:December 31, 1950, 72 y.o., female Today's Date: 08/07/2023  END OF SESSION:  PT End of Session - 08/07/23 1153     Visit Number 5    Number of Visits 16    Date for PT Re-Evaluation 09/03/23    PT Start Time 1112    PT Stop Time 1145    PT Time Calculation (min) 33 min    Equipment Utilized During Treatment Gait belt    Activity Tolerance Patient tolerated treatment well    Behavior During Therapy WFL for tasks assessed/performed              Past Medical History:  Diagnosis Date   Anemia in chronic kidney disease    a. s/p aranesp 11/2013 now starting epo with HD 06/2014   Diabetes mellitus type II    a. Currently diet controlled since losing weight.   DJD (degenerative joint disease)    Essential hypertension    GERD (gastroesophageal reflux disease)    Glaucoma, both eyes    a. L>R   History of echocardiogram    a. 01/2015 Echo [facility]): EF ?55%, triv MR, mildly dil LA, nl PV.   History of end stage renal disease    previous on HD;   received kidney transplant 07/ 2018 (on dialysis since 2015 MWF)  followed by [facility] renal (Dr. Linus Salmons) considering transplant Access: LUE fistula Establishing with [facility]   History of stress test    a. 03/2013 Myoview [facility]): EF 63%, no ischemia.   History of syncope    a. 12/2015.   History of Urge Incontinence    HLD (hyperlipidemia)    Osteoarthritis    Osteoporosis 05/03/2019   DEXA 04/2019 - 1.5 spine, -2.1 hip, -2.5 forearm Check phosphate and PTH to guide further management in h/o kidney transplant   S/P kidney transplant 04/2017   @[facility] ---   per pt one kidney from living-donor   Vaginal atrophy    a. vagifem caused spotting, normal TVS   Vitamin D deficiency    Wears glasses    stated on 05/31/2022   Wears partial dentures    upper 2 teeth. 05/31/2022   Past Surgical History:  Procedure Laterality Date   ANAL  RECTAL MANOMETRY N/A 03/16/2021   Procedure: ANO RECTAL MANOMETRY;  Surgeon: Napoleon Form, MD;  Location: [facility];  Service: Endoscopy;  Laterality: N/A;   BLADDER SUSPENSION  2011   for stress incontinence-Dr. MacDiarmid   BREAST BIOPSY Right 12/15/2020   fat necrosis   CATARACT EXTRACTION W/ INTRAOCULAR LENS IMPLANT Bilateral    left 02-25-2021;  right ?   COLONOSCOPY  03/2019   diverticulosis, rpt 10 yrs (Nandigam)   KIDNEY TRANSPLANT Right 04/03/2017   [facility])   left eye laser surgery  06/2011   LIGATION OF ARTERIOVENOUS  FISTULA  10/2017   thrombosed L arm AFV   NEPHRECTOMY TRANSPLANTED ORGAN     REFRACTIVE SURGERY     TUBAL LIGATION Bilateral    1990s   Patient Active Problem List   Diagnosis Date Noted   Secondary corneal edema of left eye 08/02/2023   Skin lesion of breast 01/02/2023   Cystoid macular degeneration, 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   Dark stools 08/08/2022   Acute renal failure superimposed on stage 3a chronic kidney disease (HCC) 07/09/2022   Anemia in chronic kidney disease    Essential hypertension    UTI (urinary tract infection)  Type II diabetes mellitus with renal manifestations (HCC)    Immunosuppressed status (HCC) 04/15/2022   Health maintenance examination 04/14/2022   Blurry vision 04/08/2021   Constipation due to outlet dysfunction    Acquired leg length discrepancy 01/28/2021   ASCUS of cervix with negative high risk HPV 10/01/2020   Thoracic spine pain 01/05/2020   Chronic lower back pain 10/08/2019   Osteoporosis 05/03/2019   Advanced care planning/counseling discussion 03/20/2019   MCI (mild cognitive impairment) with memory loss 03/20/2019   Right shoulder pain 06/19/2017   History of renal transplant 04/01/2017   Incontinence of feces 01/11/2017    Class: Acute   Insomnia 01/14/2013   Xerostomia 01/29/2012   Medicare annual wellness visit, subsequent 07/20/2011   Vaginal atrophy 04/17/2011   Type 2 diabetes mellitus  with other specified complication (HCC)    Hyperlipidemia associated with type 2 diabetes mellitus (HCC)    GERD (gastroesophageal reflux disease)    Mixed incontinence urge and stress    Vitamin D deficiency 05/19/2009   MUSCLE CRAMPS 02/22/2009   Alopecia 10/05/2008   Vertigo 07/31/2008   Osteoarthritis 01/03/2007   Primary open angle glaucoma (POAG) of both eyes, severe stage 10/03/1999    PCP: Eustaquio Boyden MD  REFERRING PROVIDER: Liana Gerold MD   REFERRING DIAG: lumbar radiculopathy, lumbar spondylosis, spondylolisthesis of lumbar spine, spinal stenosis of lumbar region  Rationale for Evaluation and Treatment: Rehabilitation  THERAPY DIAG:  Other low back pain  Muscle weakness (generalized)  Other abnormalities of gait and mobility  ONSET DATE: 1-2 weeks ago new pain began.   SUBJECTIVE:                                                                                                                                                                                           SUBJECTIVE STATEMENT: Patient reports she is in no pain today. There have been no changes to her medications since last visit and no falls. She reports exercises are going well, but her vertigo interferes at time. She arrived around ten minutes late to the session due to her doctor's appointment running behind.   PERTINENT HISTORY:  Patient presents to PT for evaluation of lumbar radiculopathy, lumbar spondylosis, spondylolisthesis of lumbar spine, spinal stenosis of lumbar region. Pmh includes renal transplant (2018), HTN, HLD, glaucoma, CKD, DM, osteoporosis, anemia, DM type II, DJD, GERD, vaginal atrophy, . She has a sacral stimulator. Per physician note she has one month of constant lateral left leg pain to her knee with pain worsening in the morning and with walking. In 2021 had L L5-S1 TF ESI. Patient reports pain is primarily in  L hip, reports she was given a shot. Reports pain goes to back of  her knee.   PAIN:  Are you having pain? No  Worst Pain: 8/10  Least pain: 3-4/10   PRECAUTIONS: None  RED FLAGS: Bowel or bladder incontinence: Yes: fecal incontinence     WEIGHT BEARING RESTRICTIONS: No  FALLS:  Has patient fallen in last 6 months? No  LIVING ENVIRONMENT: Lives with: lives with their spouse Lives in: House/apartment Stairs:  basement downstairs,  two stories, has a chair lift  Has following equipment at home:  chair lift   OCCUPATION: retired   PLOF: Independent  PATIENT GOALS: to reduce pain     OBJECTIVE:  Note: Objective measures were completed at Evaluation unless otherwise noted.  DIAGNOSTIC FINDINGS:  EXAM: CT LUMBAR SPINE WITHOUT CONTRAST   TECHNIQUE: Multidetector CT imaging of the lumbar spine was performed without intravenous contrast administration. Multiplanar CT image reconstructions were also generated.   RADIATION DOSE REDUCTION: This exam was performed according to the departmental dose-optimization program which includes automated exposure control, adjustment of the mA and/or kV according to patient size and/or use of iterative reconstruction technique.   COMPARISON:  01/13/2020   FINDINGS: Segmentation: 5 lumbar type vertebrae.   Alignment: Grade 1 anterolisthesis of L4 on L5 secondary to facet disease. 2 mm retrolisthesis of L2 on L3 and L1 on L2.   Vertebrae: No acute fracture or aggressive osseous lesion.   Paraspinal and other soft tissues: Abdominal aortic atherosclerosis. Bilateral renal atrophy.   Other: None   Disc levels:   Disc spaces: Degenerative disease with disc height loss at T11-12, L1-2, L2-3, L4-5 and L5-S1.   T12-L1: Mild broad-based disc bulge. Mild bilateral facet arthropathy. No foraminal or central canal stenosis.   L1-L2: Broad-based disc bulge. Moderate bilateral facet arthropathy. Mild right foraminal stenosis. No left foraminal stenosis. No spinal stenosis.   L2-L3: Broad-based  disc bulge. Mild bilateral facet arthropathy. Moderate spinal stenosis. Bilateral subarticular recess stenosis. Moderate bilateral foraminal stenosis.   L3-L4: Broad-based disc bulge. Moderate bilateral facet arthropathy. Severe spinal stenosis. Severe bilateral foraminal stenosis.   L4-L5: Broad-based disc bulge. Severe bilateral facet arthropathy. Mild bilateral foraminal stenosis. Severe spinal stenosis.   L5-S1: Broad-based disc bulge. Severe left and mild right facet arthropathy. Severe bilateral foraminal stenosis.   IMPRESSION: 1. Lumbar spine spondylosis as described above. 2. No acute osseous injury of the lumbar spine. 3.  Aortic Atherosclerosis (ICD10-I70.0).    PATIENT SURVEYS:  FOTO 49  SCREENING FOR RED FLAGS: Bowel or bladder incontinence: Yes:     COGNITION: Overall cognitive status: Within functional limits for tasks assessed     SENSATION: Decreased R adductor region   MUSCLE LENGTH:  Thomas test limited bilaterally   POSTURE: anterior pelvic tilt  PALPATION: Significant tension of L piriformis and quadratus lumborum   LUMBAR ROM:   AROM eval  Flexion 54  Extension 6  Right lateral flexion Limited 50%  Left lateral flexion Limited 50 %  Right rotation Limited 50%  Left rotation Limited 50%    (Blank rows = not tested)    LOWER EXTREMITY MMT:    MMT Right eval Left eval  Hip flexion 4+ 4+  Hip extension    Hip abduction 4+ 4+  Hip adduction 4+ 4+  Hip internal rotation    Hip external rotation    Knee flexion 5 5  Knee extension 4+ 4+  Ankle dorsiflexion 5 5  Ankle plantarflexion 4+ 4+  Ankle inversion  Ankle eversion     (Blank rows = not tested)  LUMBAR SPECIAL TESTS:  Straight leg raise test: Positive, SI Compression/distraction test: Positive, and FABER test: Negative  FUNCTIONAL TESTS:  5 times sit to stand: 15.415 10 meter walk test: 10.94sec 0.919m/s     GAIT: Distance walked: 30 ft Assistive device utilized:  None Level of assistance: Complete Independence Comments: antalgic gait pattern   TODAY'S TREATMENT:                                                                                                                              DATE: 08/07/23 Therex:  Nustep level 2 x 5 min  Standing: 2x10 STS, no UE  Seated:  Adduction Squeeze with side steps 2x15 alternating LE  Supine:  Deadbugs with swiss ball for tactile cueing, 1x5, 2x10, first set focusing on form, second and third set with two second hold off mat table Pelvic tilts x10, with 3 seconds  HS stretch 2x30s each LE Piriformis stretch x 2 x 30 sec each LE    PATIENT EDUCATION:  Education details: goals, POC, HEP. Pt educated throughout session about proper posture and technique with exercises. Improved exercise technique, movement at target joints, use of target muscles after min to mod verbal, visual, tactile cues.  Person educated: Patient Education method: Explanation, Demonstration, Tactile cues, and Verbal cues Education comprehension: verbalized understanding, returned demonstration, verbal cues required, and tactile cues required  HOME EXERCISE PROGRAM: Access Code: [facility] URL: [email or web address] Prepared by: Precious Bard  Exercises - Supine Lower Trunk Rotation  - 7 x weekly - 1 x daily - 2 sets - 10 reps - 5 hold - Supine Posterior Pelvic Tilt  - 7 x weekly - 1 x daily - 2 sets - 10 reps - 5 hold - Supine Piriformis Stretch with Foot on Ground  - 1 x daily - 7 x weekly - 2 sets - 1 reps - 30 hold     ASSESSMENT:  CLINICAL IMPRESSION: Patient arrived around ten minutes late due to her doctor's appointment running behind. She was highly motivated throughout the session and is making progress towards her goals. Treatment session focused on core stability, muscle extensibility, and LE strengthening. She reports she is without pain which is an improvement. Patient will continue to benefit from  skilled physical therapy to reduce pain and improve mobility to return to PLOF.     OBJECTIVE IMPAIRMENTS: Abnormal gait, decreased activity tolerance, decreased endurance, decreased mobility, difficulty walking, decreased ROM, decreased strength, hypomobility, increased fascial restrictions, impaired perceived functional ability, impaired flexibility, impaired vision/preception, improper body mechanics, postural dysfunction, and pain.   ACTIVITY LIMITATIONS: carrying, lifting, bending, sitting, standing, squatting, sleeping, transfers, bed mobility, continence, dressing, reach over head, locomotion level, and caring for others  PARTICIPATION LIMITATIONS: meal prep, cleaning, laundry, interpersonal relationship, driving, shopping, community activity, yard work, and church  PERSONAL FACTORS: Age, Fitness, Past/current experiences, Transportation, and 3+ comorbidities: enal transplant (2018), HTN,  HLD, glaucoma, CKD, DM, osteoporosis, anemia, DM type II, DJD, GERD, vaginal atrophy,  are also affecting patient's functional outcome.   REHAB POTENTIAL: Fair    CLINICAL DECISION MAKING: Evolving/moderate complexity  EVALUATION COMPLEXITY: Moderate   GOALS: Goals reviewed with patient? Yes  SHORT TERM GOALS: Target date: 08/06/2023    Patient will be independent in home exercise program to improve strength/mobility for better functional independence with ADLs. Baseline:10/7 HEP given  Goal status: INITIAL    LONG TERM GOALS: Target date: 09/03/2023    Patient will increase FOTO score to equal to or greater than  67%   to demonstrate statistically significant improvement in mobility and quality of life.  Baseline: 49 Goal status: INITIAL  2.  Patient will report a worst pain of 3/10 on VAS in  low back  to improve tolerance with ADLs and reduced symptoms with activities.  Baseline: 10/7: 8/10  Goal status: INITIAL  3. Patient will be modified independent in walking on even/uneven  surface with least restrictive assistive device, for 20+ minutes without rest break, reporting some difficulty or less to improve walking tolerance with community ambulation including grocery shopping, going to church,etc.  Baseline: pain with walking  Goal status: INITIAL  4.  Patient (> 44 years old) will complete five times sit to stand test in < 15 seconds indicating an increased LE strength and improved balance. Baseline: 15.415 sec, slight increase in L hip pain  Goal status: INITIAL    PLAN:  PT FREQUENCY: 2x/week  PT DURATION: 8 weeks  PLANNED INTERVENTIONS: Therapeutic exercises, Therapeutic activity, Neuromuscular re-education, Balance training, Gait training, Patient/Family education, Self Care, Joint mobilization, Stair training, Vestibular training, Canalith repositioning, Visual/preceptual remediation/compensation, DME instructions, Dry Needling, Spinal mobilization, Cryotherapy, Moist heat, Splintting, Taping, Manual therapy, and Re-evaluation.  PLAN FOR NEXT SESSION:  pain reduction, piriformis stretching, core strengthening    Randon Goldsmith, Student-PT  This entire session was performed under direct supervision and direction of a licensed therapist/therapist assistant . I have personally read, edited and approve of the note as written.  Precious Bard, PT 08/07/2023, 1:12 PM

## 2023-08-07 ENCOUNTER — Ambulatory Visit: Payer: Medicare Other | Attending: Orthopedic Surgery

## 2023-08-07 DIAGNOSIS — R2689 Other abnormalities of gait and mobility: Secondary | ICD-10-CM | POA: Insufficient documentation

## 2023-08-07 DIAGNOSIS — R293 Abnormal posture: Secondary | ICD-10-CM | POA: Insufficient documentation

## 2023-08-07 DIAGNOSIS — R278 Other lack of coordination: Secondary | ICD-10-CM | POA: Diagnosis not present

## 2023-08-07 DIAGNOSIS — R159 Full incontinence of feces: Secondary | ICD-10-CM | POA: Diagnosis not present

## 2023-08-07 DIAGNOSIS — M5459 Other low back pain: Secondary | ICD-10-CM | POA: Insufficient documentation

## 2023-08-07 DIAGNOSIS — M6281 Muscle weakness (generalized): Secondary | ICD-10-CM | POA: Diagnosis not present

## 2023-08-07 DIAGNOSIS — I10 Essential (primary) hypertension: Secondary | ICD-10-CM | POA: Diagnosis not present

## 2023-08-13 DIAGNOSIS — Z940 Kidney transplant status: Secondary | ICD-10-CM | POA: Diagnosis not present

## 2023-08-13 DIAGNOSIS — N186 End stage renal disease: Secondary | ICD-10-CM | POA: Diagnosis not present

## 2023-08-14 ENCOUNTER — Other Ambulatory Visit (INDEPENDENT_AMBULATORY_CARE_PROVIDER_SITE_OTHER): Payer: Medicare Other

## 2023-08-14 DIAGNOSIS — E785 Hyperlipidemia, unspecified: Secondary | ICD-10-CM

## 2023-08-14 DIAGNOSIS — E1169 Type 2 diabetes mellitus with other specified complication: Secondary | ICD-10-CM

## 2023-08-14 DIAGNOSIS — E559 Vitamin D deficiency, unspecified: Secondary | ICD-10-CM

## 2023-08-14 LAB — HEMOGLOBIN A1C: Hgb A1c MFr Bld: 7.3 % — ABNORMAL HIGH (ref 4.6–6.5)

## 2023-08-14 LAB — VITAMIN D 25 HYDROXY (VIT D DEFICIENCY, FRACTURES): VITD: 47.06 ng/mL (ref 30.00–100.00)

## 2023-08-14 LAB — LIPID PANEL
Cholesterol: 154 mg/dL (ref 0–200)
HDL: 49.7 mg/dL (ref >39.00)
LDL Cholesterol: 91 mg/dL (ref 0–99)
NonHDL: 104.36
Total CHOL/HDL Ratio: 3
Triglycerides: 65 mg/dL (ref 0.0–149.0)
VLDL: 13 mg/dL (ref 0.0–40.0)

## 2023-08-14 LAB — COMPREHENSIVE METABOLIC PANEL
ALT: 12 U/L (ref 0–35)
AST: 12 U/L (ref 0–37)
Albumin: 4.3 g/dL (ref 3.5–5.2)
Alkaline Phosphatase: 70 U/L (ref 39–117)
BUN: 27 mg/dL — ABNORMAL HIGH (ref 6–23)
CO2: 24 meq/L (ref 19–32)
Calcium: 10 mg/dL (ref 8.4–10.5)
Chloride: 108 meq/L (ref 96–112)
Creatinine, Ser: 0.81 mg/dL (ref 0.40–1.20)
GFR: 73.27 mL/min (ref >60.00)
Glucose, Bld: 141 mg/dL — ABNORMAL HIGH (ref 70–99)
Potassium: 4 meq/L (ref 3.5–5.1)
Sodium: 142 meq/L (ref 135–145)
Total Bilirubin: 0.7 mg/dL (ref 0.2–1.2)
Total Protein: 6.8 g/dL (ref 6.0–8.3)

## 2023-08-14 LAB — MICROALBUMIN / CREATININE URINE RATIO
Creatinine,U: 81.5 mg/dL
Microalb Creat Ratio: 9.4 mg/g (ref 0.0–30.0)
Microalb, Ur: 7.6 mg/dL — ABNORMAL HIGH (ref 0.0–1.9)

## 2023-08-15 ENCOUNTER — Ambulatory Visit: Payer: Medicare Other

## 2023-08-15 DIAGNOSIS — R278 Other lack of coordination: Secondary | ICD-10-CM

## 2023-08-15 DIAGNOSIS — R293 Abnormal posture: Secondary | ICD-10-CM | POA: Diagnosis not present

## 2023-08-15 DIAGNOSIS — M6281 Muscle weakness (generalized): Secondary | ICD-10-CM

## 2023-08-15 DIAGNOSIS — R2689 Other abnormalities of gait and mobility: Secondary | ICD-10-CM

## 2023-08-15 DIAGNOSIS — M5459 Other low back pain: Secondary | ICD-10-CM | POA: Diagnosis not present

## 2023-08-15 NOTE — Therapy (Signed)
OUTPATIENT PHYSICAL THERAPY THORACOLUMBAR TREATMENT   Patient Name: Kristen Kirk MRN: [id number] DOB:[birth year], 72 y.o., female Today's Date: 08/15/2023  END OF SESSION:  PT End of Session - 08/15/23 1100     Visit Number 6    Number of Visits 16    Date for PT Re-Evaluation 09/03/23    PT Start Time 1100    PT Stop Time 1143    PT Time Calculation (min) 43 min    Equipment Utilized During Treatment Gait belt    Activity Tolerance Patient tolerated treatment well    Behavior During Therapy WFL for tasks assessed/performed               Past Medical History:  Diagnosis Date   Anemia in chronic kidney disease    a. s/p aranesp 11/2013 now starting epo with HD 06/2014   Diabetes mellitus type II    a. Currently diet controlled since losing weight.   DJD (degenerative joint disease)    Essential hypertension    GERD (gastroesophageal reflux disease)    Glaucoma, both eyes    a. L>R   History of echocardiogram    a. 01/2015 Echo [facility]): EF ?55%, triv MR, mildly dil LA, nl PV.   History of end stage renal disease    previous on HD;   received kidney transplant 07/ 2018 (on dialysis since 2015 MWF)  followed by [facility] renal (Dr. Linus Salmons) considering transplant Access: LUE fistula Establishing with [facility]   History of stress test    a. 03/2013 [facility]): EF 63%, no ischemia.   History of syncope    a. 12/2015.   History of Urge Incontinence    HLD (hyperlipidemia)    Osteoarthritis    Osteoporosis 05/03/2019   DEXA 04/2019 - 1.5 spine, -2.1 hip, -2.5 forearm Check phosphate and PTH to guide further management in h/o kidney transplant   S/P kidney transplant 04/2017   @[facility] ---   per pt one kidney from living-donor   Vaginal atrophy    a. vagifem caused spotting, normal TVS   Vitamin D deficiency    Wears glasses    stated on 05/31/2022   Wears partial dentures    upper 2 teeth. 05/31/2022   Past Surgical History:  Procedure Laterality Date    ANAL RECTAL MANOMETRY N/A 03/16/2021   Procedure: ANO RECTAL MANOMETRY;  Surgeon: Napoleon Form, MD;  Location: [facility];  Service: Endoscopy;  Laterality: N/A;   BLADDER SUSPENSION  2011   for stress incontinence-Dr. MacDiarmid   BREAST BIOPSY Right 12/15/2020   fat necrosis   CATARACT EXTRACTION W/ INTRAOCULAR LENS IMPLANT Bilateral    left 02-25-2021;  right ?   COLONOSCOPY  03/2019   diverticulosis, rpt 10 yrs (Nandigam)   KIDNEY TRANSPLANT Right 04/03/2017   [facility])   left eye laser surgery  06/2011   LIGATION OF ARTERIOVENOUS  FISTULA  10/2017   thrombosed L arm AFV   NEPHRECTOMY TRANSPLANTED ORGAN     REFRACTIVE SURGERY     TUBAL LIGATION Bilateral    1990s   Patient Active Problem List   Diagnosis Date Noted   Secondary corneal edema of left eye 08/02/2023   Skin lesion of breast 01/02/2023   Cystoid macular degeneration, 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   Dark stools 08/08/2022   Acute renal failure superimposed on stage 3a chronic kidney disease (HCC) 07/09/2022   Anemia in chronic kidney disease    Essential hypertension    UTI (urinary tract  infection)    Type II diabetes mellitus with renal manifestations (HCC)    Immunosuppressed status (HCC) 04/15/2022   Health maintenance examination 04/14/2022   Blurry vision 04/08/2021   Constipation due to outlet dysfunction    Acquired leg length discrepancy 01/28/2021   ASCUS of cervix with negative high risk HPV 10/01/2020   Thoracic spine pain 01/05/2020   Chronic lower back pain 10/08/2019   Osteoporosis 05/03/2019   Advanced care planning/counseling discussion 03/20/2019   MCI (mild cognitive impairment) with memory loss 03/20/2019   Right shoulder pain 06/19/2017   History of renal transplant 04/01/2017   Incontinence of feces 01/11/2017    Class: Acute   Insomnia 01/14/2013   Xerostomia 01/29/2012   Medicare annual wellness visit, subsequent 07/20/2011   Vaginal atrophy 04/17/2011   Type 2 diabetes  mellitus with other specified complication (HCC)    Hyperlipidemia associated with type 2 diabetes mellitus (HCC)    GERD (gastroesophageal reflux disease)    Mixed incontinence urge and stress    Vitamin D deficiency 05/19/2009   MUSCLE CRAMPS 02/22/2009   Alopecia 10/05/2008   Vertigo 07/31/2008   Osteoarthritis 01/03/2007   Primary open angle glaucoma (POAG) of both eyes, severe stage 10/03/1999    PCP: Eustaquio Boyden MD  REFERRING PROVIDER: Liana Gerold MD   REFERRING DIAG: lumbar radiculopathy, lumbar spondylosis, spondylolisthesis of lumbar spine, spinal stenosis of lumbar region  Rationale for Evaluation and Treatment: Rehabilitation  THERAPY DIAG:  Other low back pain  Muscle weakness (generalized)  Other abnormalities of gait and mobility  Abnormal posture  Other lack of coordination  ONSET DATE: 1-2 weeks ago new pain began.   SUBJECTIVE:                                                                                                                                                                                           SUBJECTIVE STATEMENT: Patient reports some left sided low back pain and posterior leg pain.    PERTINENT HISTORY:  Patient presents to PT for evaluation of lumbar radiculopathy, lumbar spondylosis, spondylolisthesis of lumbar spine, spinal stenosis of lumbar region. Pmh includes renal transplant (2018), HTN, HLD, glaucoma, CKD, DM, osteoporosis, anemia, DM type II, DJD, GERD, vaginal atrophy, . She has a sacral stimulator. Per physician note she has one month of constant lateral left leg pain to her knee with pain worsening in the morning and with walking. In 2021 had L L5-S1 TF ESI. Patient reports pain is primarily in L hip, reports she was given a shot. Reports pain goes to back of her knee.   PAIN:  Are you having pain? yes  Worst Pain: 8/10  Least pain: 3-4/10   PRECAUTIONS: None  RED FLAGS: Bowel or bladder incontinence: Yes:  fecal incontinence     WEIGHT BEARING RESTRICTIONS: No  FALLS:  Has patient fallen in last 6 months? No  LIVING ENVIRONMENT: Lives with: lives with their spouse Lives in: House/apartment Stairs:  basement downstairs,  two stories, has a chair lift  Has following equipment at home:  chair lift   OCCUPATION: retired   PLOF: Independent  PATIENT GOALS: to reduce pain     OBJECTIVE:  Note: Objective measures were completed at Evaluation unless otherwise noted.  DIAGNOSTIC FINDINGS:  EXAM: CT LUMBAR SPINE WITHOUT CONTRAST   TECHNIQUE: Multidetector CT imaging of the lumbar spine was performed without intravenous contrast administration. Multiplanar CT image reconstructions were also generated.   RADIATION DOSE REDUCTION: This exam was performed according to the departmental dose-optimization program which includes automated exposure control, adjustment of the mA and/or kV according to patient size and/or use of iterative reconstruction technique.   COMPARISON:  01/13/2020   FINDINGS: Segmentation: 5 lumbar type vertebrae.   Alignment: Grade 1 anterolisthesis of L4 on L5 secondary to facet disease. 2 mm retrolisthesis of L2 on L3 and L1 on L2.   Vertebrae: No acute fracture or aggressive osseous lesion.   Paraspinal and other soft tissues: Abdominal aortic atherosclerosis. Bilateral renal atrophy.   Other: None   Disc levels:   Disc spaces: Degenerative disease with disc height loss at T11-12, L1-2, L2-3, L4-5 and L5-S1.   T12-L1: Mild broad-based disc bulge. Mild bilateral facet arthropathy. No foraminal or central canal stenosis.   L1-L2: Broad-based disc bulge. Moderate bilateral facet arthropathy. Mild right foraminal stenosis. No left foraminal stenosis. No spinal stenosis.   L2-L3: Broad-based disc bulge. Mild bilateral facet arthropathy. Moderate spinal stenosis. Bilateral subarticular recess stenosis. Moderate bilateral foraminal stenosis.    L3-L4: Broad-based disc bulge. Moderate bilateral facet arthropathy. Severe spinal stenosis. Severe bilateral foraminal stenosis.   L4-L5: Broad-based disc bulge. Severe bilateral facet arthropathy. Mild bilateral foraminal stenosis. Severe spinal stenosis.   L5-S1: Broad-based disc bulge. Severe left and mild right facet arthropathy. Severe bilateral foraminal stenosis.   IMPRESSION: 1. Lumbar spine spondylosis as described above. 2. No acute osseous injury of the lumbar spine. 3.  Aortic Atherosclerosis (ICD10-I70.0).    PATIENT SURVEYS:  FOTO 49  SCREENING FOR RED FLAGS: Bowel or bladder incontinence: Yes:     COGNITION: Overall cognitive status: Within functional limits for tasks assessed     SENSATION: Decreased R adductor region   MUSCLE LENGTH:  Thomas test limited bilaterally   POSTURE: anterior pelvic tilt  PALPATION: Significant tension of L piriformis and quadratus lumborum   LUMBAR ROM:   AROM eval  Flexion 54  Extension 6  Right lateral flexion Limited 50%  Left lateral flexion Limited 50 %  Right rotation Limited 50%  Left rotation Limited 50%    (Blank rows = not tested)    LOWER EXTREMITY MMT:    MMT Right eval Left eval  Hip flexion 4+ 4+  Hip extension    Hip abduction 4+ 4+  Hip adduction 4+ 4+  Hip internal rotation    Hip external rotation    Knee flexion 5 5  Knee extension 4+ 4+  Ankle dorsiflexion 5 5  Ankle plantarflexion 4+ 4+  Ankle inversion    Ankle eversion     (Blank rows = not tested)  LUMBAR SPECIAL TESTS:  Straight leg raise test: Positive, SI Compression/distraction test:  Positive, and FABER test: Negative  FUNCTIONAL TESTS:  5 times sit to stand: 15.415 10 meter walk test: 10.94sec 0.956m/s     GAIT: Distance walked: 30 ft Assistive device utilized: None Level of assistance: Complete Independence Comments: antalgic gait pattern   TODAY'S TREATMENT:                                                                                                                               DATE: 08/15/23  LBP rated at 8/10 on NPS prior to treatment  Therex:  Supine:  PROM - knee to chest - hold 30 sec x 4 each LE PROM- Hip circles 20 reps CW then 20 reps CCW each LE PROM- Figure 4- hold 30 sec x 4 each LE PROM- Piriformis - hold 30 sec x 4 each LE PROM- Hamstring -hold 30 sec x 2 each LE PROM- LTR- hold 15 sec x 4 each direction Seated figure 4- hold 30 sec x 2 each LE Seated hamstring stretch - hold 30 sec x 2 each LE  (See below for HEP- additions today- issued handout)   LBP rated at 4/10 on NPS after session.    PATIENT EDUCATION:  Education details: goals, POC, HEP. Pt educated throughout session about proper posture and technique with exercises. Improved exercise technique, movement at target joints, use of target muscles after min to mod verbal, visual, tactile cues.  Person educated: Patient Education method: Explanation, Demonstration, Tactile cues, and Verbal cues Education comprehension: verbalized understanding, returned demonstration, verbal cues required, and tactile cues required  HOME EXERCISE PROGRAM:  Access Code: [email or web address] URL: [email or web address] Date: 08/15/2023 Prepared by: Maureen Ralphs  Exercises - Supine Lower Trunk Rotation  - 1-2 x daily - 3 sets - 30 hold - Hooklying Single Knee to Chest Stretch  - 1 x daily - 3 sets - 30 hold - Seated Piriformis Stretch with Trunk Bend  - 1-2 x daily - 3 sets - 30 hold - Seated Hamstring Stretch  - 1-2 x daily - 3 sets - 30 hold    Access Code: [facility] URL: [email or web address] Prepared by: Precious Bard  Exercises - Supine Lower Trunk Rotation  - 7 x weekly - 1 x daily - 2 sets - 10 reps - 5 hold - Supine Posterior Pelvic Tilt  - 7 x weekly - 1 x daily - 2 sets - 10 reps - 5 hold - Supine Piriformis Stretch with Foot on Ground  - 1 x daily - 7 x weekly - 2 sets - 1 reps - 30  hold     ASSESSMENT:  CLINICAL IMPRESSION: Patient reported not as compliant with HEP when asked so treatment today focused on emphasizing importance of HEP and demonstrating some basic Low back and LE ROM activities. She responded well- able to perform with only difficulty initially coordinating movement- improved with practice. She reported decreased overall pain after session and able to verbalize and demonstrate good understanding of all  exercises provided today. Patient will continue to benefit from skilled physical therapy to reduce pain and improve mobility to return to PLOF.     OBJECTIVE IMPAIRMENTS: Abnormal gait, decreased activity tolerance, decreased endurance, decreased mobility, difficulty walking, decreased ROM, decreased strength, hypomobility, increased fascial restrictions, impaired perceived functional ability, impaired flexibility, impaired vision/preception, improper body mechanics, postural dysfunction, and pain.   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   REHAB POTENTIAL: Fair    CLINICAL DECISION MAKING: Evolving/moderate complexity  EVALUATION COMPLEXITY: Moderate   GOALS: Goals reviewed with patient? Yes  SHORT TERM GOALS: Target date: 08/06/2023    Patient will be independent in home exercise program to improve strength/mobility for better functional independence with ADLs. Baseline:10/7 HEP given   Goal status: INITIAL    LONG TERM GOALS: Target date: 09/03/2023    Patient will increase FOTO score to equal to or greater than  67%   to demonstrate statistically significant improvement in mobility and quality of life.  Baseline: 49 Goal status: INITIAL  2.  Patient will report a worst pain of 3/10 on VAS in  low back  to improve tolerance with ADLs and reduced symptoms with activities.  Baseline: 10/7: 8/10  Goal status: INITIAL  3. Patient will be modified independent in walking on even/uneven surface with least restrictive assistive device, for 20+ minutes without rest break, reporting some difficulty or less to improve walking tolerance with community ambulation including grocery shopping, going to church,etc.  Baseline: pain with walking  Goal status: INITIAL  4.  Patient (> 70 years old) will complete five times sit to stand test in < 15 seconds indicating an increased LE strength and improved balance. Baseline: 15.415 sec, slight increase in L hip pain  Goal status: INITIAL    PLAN:  PT FREQUENCY: 2x/week  PT DURATION: 8 weeks  PLANNED INTERVENTIONS: Therapeutic exercises, Therapeutic activity, Neuromuscular re-education, Balance training, Gait training, Patient/Family education, Self Care, Joint mobilization, Stair training, Vestibular training, Canalith repositioning, Visual/preceptual remediation/compensation, DME instructions, Dry Needling, Spinal mobilization, Cryotherapy, Moist heat, Splintting, Taping, Manual therapy, and Re-evaluation.  PLAN FOR NEXT SESSION:  pain reduction, piriformis stretching, core strengthening      Lenda Kelp, PT 08/15/2023, 3:58 PM

## 2023-08-17 NOTE — Progress Notes (Deleted)
Referring Physician:  Eustaquio Boyden, MD [address] [place],  [place] [address]  Primary Physician:  Eustaquio Boyden, MD  History of Present Illness: 08/17/2023 [name] has a history of renal transplant, HTN, hyperlipidemia, glaucoma, CKD, DM, osteoporosis.    She has sacral stimulator.  Last seen by me on 07/04/23 for left lateral leg pain. She has known lumbar spondylosis with severe central stenosis L3-L5. Also with multilevel foraminal stenosis.   She has started PT and has done 6 sessions. She had MBB to the left L5-S1 and L4-5 facet joints on 08/01/23.   She is here for follow up.          1 month of constant left lateral leg pain to her knee. No significant LBP. Pain is worse in the morning and with walking. Some better with sitting, but worse when she gets up. No numbness or tingling in her leg. Some feelings of weakness in left leg. No right leg symptoms.   No relief yet with ESI on 06/05/23- has not helped left leg.   No NSAIDs due to h/p renal transplant and CKD. She is taking zanaflex- not sure it helps.   Bowel/Bladder Dysfunction: she has urinary frequency. History of bowel incontinence for years- has sacral stimulator.   Conservative measures:  Physical therapy: initial PT eval on 07/09/23 and has done 5 additional visits (last on 08/15/23)  Multimodal medical therapy including regular antiinflammatories: prednisone, tylenol, ultram, tylenol #3, zanaflex, flexeril Injections:  08/01/2023: MBB to the left L5-S1 and L4-5 facet joints (8/10 to 1/10)  06/05/2023: Left L5-S1 transforaminal ESI 02/04/2020: Left L5-S1 TF ESI   Past Surgery: no previous spinal surgeries   Kristen Kirk has no symptoms of cervical myelopathy.  The symptoms are causing a significant impact on the patient's life.   Review of Systems:  A 10 point review of systems is negative, except for the pertinent positives and negatives detailed in the HPI.  Past  Medical History: Past Medical History:  Diagnosis Date   Anemia in chronic kidney disease    a. s/p aranesp 11/2013 now starting epo with HD 06/2014   Diabetes mellitus type II    a. Currently diet controlled since losing weight.   DJD (degenerative joint disease)    Essential hypertension    GERD (gastroesophageal reflux disease)    Glaucoma, both eyes    a. L>R   History of echocardiogram    a. 01/2015 Echo [facility]): EF ?55%, triv MR, mildly dil LA, nl PV.   History of end stage renal disease    previous on HD;   received kidney transplant 07/ 2018 (on dialysis since 2015 MWF)  followed by [facility] renal (Dr. Linus Salmons) considering transplant Access: LUE fistula Establishing with [facility]   History of stress test    a. 03/2013 Myoview [facility]): EF 63%, no ischemia.   History of syncope    a. 12/2015.   History of Urge Incontinence    HLD (hyperlipidemia)    Osteoarthritis    Osteoporosis 05/03/2019   DEXA 04/2019 - 1.5 spine, -2.1 hip, -2.5 forearm Check phosphate and PTH to guide further management in h/o kidney transplant   S/P kidney transplant 04/2017   @[facility] ---   per pt one kidney from living-donor   Vaginal atrophy    a. vagifem caused spotting, normal TVS   Vitamin D deficiency    Wears glasses    stated on 05/31/2022   Wears partial dentures  upper 2 teeth. 05/31/2022    Past Surgical History: Past Surgical History:  Procedure Laterality Date   ANAL RECTAL MANOMETRY N/A 03/16/2021   Procedure: ANO RECTAL MANOMETRY;  Surgeon: Napoleon Form, MD;  Location: [facility];  Service: Endoscopy;  Laterality: N/A;   BLADDER SUSPENSION  2011   for stress incontinence-Dr. MacDiarmid   BREAST BIOPSY Right 12/15/2020   fat necrosis   CATARACT EXTRACTION W/ INTRAOCULAR LENS IMPLANT Bilateral    left 02-25-2021;  right ?   COLONOSCOPY  03/2019   diverticulosis, rpt 10 yrs (Nandigam)   KIDNEY TRANSPLANT Right 04/03/2017   [facility])   left eye laser surgery  06/2011    LIGATION OF ARTERIOVENOUS  FISTULA  10/2017   thrombosed L arm AFV   NEPHRECTOMY TRANSPLANTED ORGAN     REFRACTIVE SURGERY     TUBAL LIGATION Bilateral    1990s    Allergies: Allergies as of 08/20/2023 - Review Complete 08/15/2023  Allergen Reaction Noted   Brimonidine  03/02/2014   Shellfish-derived products Swelling 03/02/2014   Tramadol Other (See Comments) 01/05/2020    Medications: Outpatient Encounter Medications as of 08/20/2023  Medication Sig   acetaminophen (TYLENOL) 500 MG tablet Take 1 tablet (500 mg total) by mouth 3 (three) times daily as needed for moderate pain.   alendronate (FOSAMAX) 70 MG tablet Take 1 tablet (70 mg total) by mouth every 7 (seven) days. Take with a full glass of water on an empty stomach.   amLODipine (NORVASC) 2.5 MG tablet Take 2.5 mg by mouth daily.   apraclonidine (IOPIDINE) 0.5 % ophthalmic solution Place 1 drop into the right eye daily.   aspirin 81 MG tablet Take 81 mg by mouth daily.   Blood Glucose Monitoring Suppl (ONE TOUCH ULTRA SYSTEM KIT) W/DEVICE KIT 1 kit by Does not apply route once.   carvedilol (COREG) 25 MG tablet Take 12.5 mg by mouth 2 (two) times daily with a meal.   Cholecalciferol (VITAMIN D3) 1000 units CAPS Take 2 capsules (2,000 Units total) by mouth daily.   docusate sodium (COLACE) 100 MG capsule Take 100 mg by mouth 2 (two) times daily as needed for mild constipation or moderate constipation.   dorzolamide-timolol (COSOPT) 2-0.5 % ophthalmic solution Apply to eye.   famotidine (PEPCID) 20 MG tablet Take 1 tablet (20 mg total) by mouth at bedtime.   glucose blood test strip Check sugars once daily   hydrochlorothiazide (HYDRODIURIL) 12.5 MG tablet Take 1 tablet (12.5 mg total) by mouth daily.   ketorolac (ACULAR) 0.5 % ophthalmic solution Place 1 drop into the left eye 4 (four) times daily.   latanoprost (XALATAN) 0.005 % ophthalmic solution Place 1 drop into the right eye at bedtime.   Melatonin 3 MG CAPS Take 1-2  capsules (3-6 mg total) by mouth at bedtime as needed (sleep).   memantine (NAMENDA) 5 MG tablet Take 1 tablet (5 mg total) by mouth daily.   Multiple Vitamin (MULTIVITAMIN) tablet Take 1 tablet by mouth daily.   mycophenolate (CELLCEPT) 500 MG tablet Take 500 mg by mouth 2 (two) times daily.   NEOMYCIN-POLYMYXIN-HYDROCORTISONE (CORTISPORIN) 1 % SOLN OTIC solution Apply 1-2 drops to toe BID after soaking   pantoprazole (PROTONIX) 20 MG tablet Take 1 tablet by mouth once daily   pravastatin (PRAVACHOL) 80 MG tablet Take 1 tablet (80 mg total) by mouth every evening.   prednisoLONE acetate (PRED FORTE) 1 % ophthalmic suspension Place 1 drop into the left eye every 2 (two) hours  while awake.   tacrolimus (PROGRAF) 0.5 MG capsule Take 0.5-1 mg by mouth See admin instructions. Take 2 capsules (1mg ) by mouth every morning and take 1 capsule (0.5mg ) by mouth every night   tiZANidine (ZANAFLEX) 2 MG tablet Take 1 tablet (2 mg total) by mouth 3 times/day as needed-between meals & bedtime for muscle spasms.   No facility-administered encounter medications on file as of 08/20/2023.    Social History: Social History   Tobacco Use   Smoking status: Former    Current packs/day: 0.00    Types: Cigarettes    Quit date: 1987    Years since quitting: 37.8   Smokeless tobacco: Never   Tobacco comments:    Remote- quit ~ late 1980's.  Vaping Use   Vaping status: Never Used  Substance Use Topics   Alcohol use: No    Alcohol/week: 0.0 standard drinks of alcohol   Drug use: No    Family Medical History: Family History  Problem Relation Age of Onset   Other Father        she did not know her father.   Diabetes Mother    Heart failure Mother        died @ 35   Stroke Brother    Hyperlipidemia Brother    Hypertension Brother    Hyperlipidemia Brother    Hypertension Brother    Hyperlipidemia Sister    Hypertension Sister    Hyperlipidemia Sister    Hypertension Sister    Heart failure  Maternal Grandfather    Breast cancer Neg Hx    Colon cancer Neg Hx    Esophageal cancer Neg Hx    Stomach cancer Neg Hx    Rectal cancer Neg Hx     Physical Examination: There were no vitals filed for this visit.    Awake, alert, oriented to person, place, and time.  Speech is clear and fluent. Fund of knowledge is appropriate.   Cranial Nerves: Pupils equal round and reactive to light.  Facial tone is symmetric.    No lower lumbar tenderness.   No abnormal lesions on exposed skin.   Strength: Side Biceps Triceps Deltoid Interossei Grip Wrist Ext. Wrist Flex.  R 5 5 5 5 5 5 5   L 5 5 5 5 5 5 5    Side Iliopsoas Quads Hamstring PF DF EHL  R 5 5 5 5 5 5   L 5 5 5 5 5 5    Reflexes are 2+ and symmetric at the biceps, brachioradialis, patella and achilles.   Hoffman's is absent.  Clonus is not present.   Bilateral upper and lower extremity sensation is intact to light touch.     She has good ROM of left hip with no pain.   Gait is normal.     Medical Decision Making  Imaging: None  Assessment and Plan: Ms. Rainbolt is a pleasant 72 y.o. female who has a 1 month history of constant left lateral leg pain to her knee. No significant LBP. Some feelings of weakness in left leg. Better with grocery cart.   She has known lumbar spondylosis with severe central stenosis L3-L5. Also with multilevel foraminal stenosis. Left leg pain may be from severe foraminal stenosis L5-S1. She likely is having symptoms from spinal stenosis as well.   Treatment options discussed with patient and following plan made:   - Order for physical therapy for lumbar spine to [facility] in [place]. Patient to call to schedule appointment.  - Follow up with  Dr. Yves Dill to consider another ESI. No improvement with last one.  - Medications limited as she has history of renal transplant.  - If no improvement with above, consider CT myelogram and possible follow up with Dr. Myer Haff or Dr. Katrinka Blazing to discuss  possible surgery options.  - Follow up with me in 6-8 weeks.   I spent a total of 35 minutes in face-to-face and non-face-to-face activities related to this patient's care today including review of outside records, review of imaging, review of symptoms, physical exam, discussion of differential diagnosis, discussion of treatment options, and documentation.   Thank you for involving me in the care of this patient.   Drake Leach PA-C Dept. of Neurosurgery

## 2023-08-20 ENCOUNTER — Ambulatory Visit: Payer: Medicare Other | Admitting: Orthopedic Surgery

## 2023-08-20 DIAGNOSIS — M47816 Spondylosis without myelopathy or radiculopathy, lumbar region: Secondary | ICD-10-CM | POA: Diagnosis not present

## 2023-08-20 DIAGNOSIS — M48062 Spinal stenosis, lumbar region with neurogenic claudication: Secondary | ICD-10-CM | POA: Diagnosis not present

## 2023-08-20 DIAGNOSIS — M5416 Radiculopathy, lumbar region: Secondary | ICD-10-CM | POA: Diagnosis not present

## 2023-08-20 DIAGNOSIS — M4807 Spinal stenosis, lumbosacral region: Secondary | ICD-10-CM | POA: Diagnosis not present

## 2023-08-20 DIAGNOSIS — M1612 Unilateral primary osteoarthritis, left hip: Secondary | ICD-10-CM | POA: Diagnosis not present

## 2023-08-21 ENCOUNTER — Other Ambulatory Visit (HOSPITAL_COMMUNITY)
Admission: RE | Admit: 2023-08-21 | Discharge: 2023-08-21 | Disposition: A | Discharge status: Home / Self Care | Payer: Medicare Other | Source: Ambulatory Visit | Attending: Family Medicine | Admitting: Family Medicine

## 2023-08-21 ENCOUNTER — Encounter: Payer: Self-pay | Admitting: Family Medicine

## 2023-08-21 ENCOUNTER — Ambulatory Visit: Payer: Medicare Other | Admitting: Family Medicine

## 2023-08-21 VITALS — BP 116/70 | HR 72 | Temp 98.2°F | Ht 60.75 in | Wt 129.4 lb

## 2023-08-21 DIAGNOSIS — R87610 Atypical squamous cells of undetermined significance on cytologic smear of cervix (ASC-US): Secondary | ICD-10-CM

## 2023-08-21 DIAGNOSIS — Z01419 Encounter for gynecological examination (general) (routine) without abnormal findings: Secondary | ICD-10-CM | POA: Diagnosis not present

## 2023-08-21 DIAGNOSIS — E785 Hyperlipidemia, unspecified: Secondary | ICD-10-CM

## 2023-08-21 DIAGNOSIS — E1129 Type 2 diabetes mellitus with other diabetic kidney complication: Secondary | ICD-10-CM

## 2023-08-21 DIAGNOSIS — E559 Vitamin D deficiency, unspecified: Secondary | ICD-10-CM

## 2023-08-21 DIAGNOSIS — R152 Fecal urgency: Secondary | ICD-10-CM

## 2023-08-21 DIAGNOSIS — E1169 Type 2 diabetes mellitus with other specified complication: Secondary | ICD-10-CM | POA: Diagnosis not present

## 2023-08-21 DIAGNOSIS — D849 Immunodeficiency, unspecified: Secondary | ICD-10-CM

## 2023-08-21 DIAGNOSIS — Z0000 Encounter for general adult medical examination without abnormal findings: Secondary | ICD-10-CM

## 2023-08-21 DIAGNOSIS — M818 Other osteoporosis without current pathological fracture: Secondary | ICD-10-CM

## 2023-08-21 DIAGNOSIS — G3184 Mild cognitive impairment, so stated: Secondary | ICD-10-CM

## 2023-08-21 DIAGNOSIS — H401133 Primary open-angle glaucoma, bilateral, severe stage: Secondary | ICD-10-CM

## 2023-08-21 DIAGNOSIS — Z1151 Encounter for screening for human papillomavirus (HPV): Secondary | ICD-10-CM | POA: Diagnosis not present

## 2023-08-21 DIAGNOSIS — R159 Full incontinence of feces: Secondary | ICD-10-CM

## 2023-08-21 DIAGNOSIS — K219 Gastro-esophageal reflux disease without esophagitis: Secondary | ICD-10-CM

## 2023-08-21 DIAGNOSIS — N3946 Mixed incontinence: Secondary | ICD-10-CM

## 2023-08-21 DIAGNOSIS — I10 Essential (primary) hypertension: Secondary | ICD-10-CM

## 2023-08-21 DIAGNOSIS — Z940 Kidney transplant status: Secondary | ICD-10-CM

## 2023-08-21 MED ORDER — PANTOPRAZOLE SODIUM 20 MG PO TBEC: 20.0000 mg | DELAYED_RELEASE_TABLET | Freq: Every day | ORAL | 4 refills | Status: DC

## 2023-08-21 MED ORDER — ALENDRONATE SODIUM 70 MG PO TABS: 70.0000 mg | ORAL_TABLET | ORAL | 4 refills | Status: DC

## 2023-08-21 MED ORDER — PRAVASTATIN SODIUM 80 MG PO TABS: 80.0000 mg | ORAL_TABLET | Freq: Every evening | ORAL | 4 refills | Status: DC

## 2023-08-21 MED ORDER — HYDROCHLOROTHIAZIDE 12.5 MG PO TABS: 12.5000 mg | ORAL_TABLET | Freq: Every day | ORAL | 4 refills | Status: DC

## 2023-08-21 NOTE — Assessment & Plan Note (Signed)
Preventative protocols reviewed and updated unless pt declined. Discussed healthy diet and lifestyle.  Pap smear today, # provided to call and schedule mammogram.

## 2023-08-21 NOTE — Progress Notes (Unsigned)
Ph: [phone] Fax: [phone]   Patient ID: Kristen Kirk, female    DOB: 21-Jul-1951, 72 y.o.   MRN: [id number]  This visit was conducted in person.  BP 116/70   Pulse 72   Temp 98.2 F (36.8 C) (Oral)   Ht 5' 0.75" (1.543 m)   Wt 129 lb 6 oz (58.7 kg)   LMP  (LMP Unknown)   SpO2 99%   BMI 24.65 kg/m    CC: CPE Subjective:   HPI: Kristen Kirk is a 71 y.o. female presenting on 08/21/2023 for Annual Exam (MCR prt 2 [AWV- 07/31/23].)   Saw health advisor 07/2023 for medicare wellness visit. Note reviewed.   No results found.  Flowsheet Row Clinical Support from 07/31/2023 in [facility] [place]  PHQ-2 Total Score 0          07/31/2023    2:46 PM 05/15/2023    9:27 AM 03/21/2023    3:06 PM 01/17/2023    8:58 AM 01/01/2023    4:20 PM  Fall Risk   Falls in the past year? 0 0 0 1 0  Number falls in past yr: 0   0 0  Injury with Fall? 0   0 0  Risk for fall due to : No Fall Risks      Follow up Education provided;Falls prevention discussed        Kidney transplant 04/2017. On prograf and mycophenolate. Goal BP <130/80. Regularly sees nephrologist.   Fecal incontinence - saw gen surgery Dr Maisie Fus - anal manometry showed decreased int sphincter function. Now taking fiber supplement. S/p sacral nerve stimulator placement 2023. Last saw gen surg 08/07/2023.   Sees PMR Chasnis and Whitney NP, last seen yesterday - rec scheduled tylenol, HEP after PT,. Planning L L5/S1 and L4/5 MBB x1 with goal RFA eval.  Notes leg length discrepancy - saw Dr Patsy Lager 01/2021 with 1cm lift for shoes as well as ortho felt - doesn't think this was too helpful. Notes this leads to unsteadiness and increases fall risk.   She is on fosamax since 2021. She has started having L hip/buttock pain present for 3 weeks. This is being evaluated by PM&R She stopped Namenda due to dry mouth  Preventative: COLONOSCOPY 03/2019 - diverticulosis, rpt 10 yrs (Nandigam) Pap  normal 07/2011, 08/2014 WNL. Pap 03/2019 - ASCUS, HPV neg. Will repeat today.  Diagnostic mammogram 01/2022 Bir[facility]2 @ [place]  DEXA 04/2019 - 1.5 spine, -2.1 hip, -2.5 forearm - osteoporosis  DEXA 07/2022 - T -2.6 R forearm - continue weekly fosamax started by renal 2021. Regular walking, vitamin D 2000IU daily, dietary calcium intake.  Lung cancer screen - quit smoking ~1987 Flu shot yearly.  COVD vaccine Pfizer 11/2019, 12/2019, booster 06/2020  Tetanus 2009.  RSV 08/2023 Pneumovax 2013. Prevnar-13 2020 Shingrix - 07/2018, 10/2020 Advanced directive - does not have this yet. Packet provided today. HCPOA would be husband and sister. Full code, would be ok with feeding tube. Would not want prolonged life support if terminal condition.  Seat belt use discussed  Sunscreen use discussed. No changing moles on skin  Non smoker Alcohol - none Dentist q6 mo  Eye exam yearly - known severe bilateral POAG sees Dr Guss Bunde at [facility].  Bowel - ongoing incontinence issue as per above. Wears pad regularly - overall better after sacral nerve stimulator placement  Bladder - chronic urge incontinence previously followed by uro s/p PTNS - continues myrbetriq with benefit during the  Kristen, worse symptoms at night time. Has tried multiple other treatments. Wears pad regularly Producer, television/film/video)  Lives with husband (on disability) Grown children - daughter in [place] Occupation: Engineer, mining at [employer], 3rd shift Activity: walking regularly - twice weekly  Diet: good water, fruits/vegetables daily      Relevant past medical, surgical, family and social history reviewed and updated as indicated. Interim medical history since our last visit reviewed. Allergies and medications reviewed and updated. Outpatient Medications Prior to Visit  Medication Sig Dispense Refill   acetaminophen (TYLENOL) 500 MG tablet Take 1 tablet (500 mg total) by mouth 3 (three) times daily as needed for moderate pain.     amLODipine (NORVASC)  2.5 MG tablet Take 2.5 mg by mouth daily.     apraclonidine (IOPIDINE) 0.5 % ophthalmic solution Place 1 drop into the right eye daily.     aspirin 81 MG tablet Take 81 mg by mouth daily.     Blood Glucose Monitoring Suppl (ONE TOUCH ULTRA SYSTEM KIT) W/DEVICE KIT 1 kit by Does not apply route once. 1 each 0   carvedilol (COREG) 25 MG tablet Take 12.5 mg by mouth 2 (two) times daily with a meal.     cephALEXin (KEFLEX) 500 MG capsule Take by mouth.     Cholecalciferol (VITAMIN D3) 1000 units CAPS Take 2 capsules (2,000 Units total) by mouth daily. 60 capsule    docusate sodium (COLACE) 100 MG capsule Take 100 mg by mouth 2 (two) times daily as needed for mild constipation or moderate constipation.  0   dorzolamide-timolol (COSOPT) 2-0.5 % ophthalmic solution Apply to eye.     famotidine (PEPCID) 20 MG tablet Take 1 tablet (20 mg total) by mouth at bedtime. 30 tablet 2   glucose blood test strip Check sugars once daily 100 each 11   ketorolac (ACULAR) 0.5 % ophthalmic solution Place 1 drop into the left eye 4 (four) times daily.     latanoprost (XALATAN) 0.005 % ophthalmic solution Place 1 drop into the right eye at bedtime.     Melatonin 3 MG CAPS Take 1-2 capsules (3-6 mg total) by mouth at bedtime as needed (sleep).  0   Multiple Vitamin (MULTIVITAMIN) tablet Take 1 tablet by mouth daily.     mycophenolate (CELLCEPT) 500 MG tablet Take 500 mg by mouth 2 (two) times daily.     NEOMYCIN-POLYMYXIN-HYDROCORTISONE (CORTISPORIN) 1 % SOLN OTIC solution Apply 1-2 drops to toe BID after soaking 10 mL 1   prednisoLONE acetate (PRED FORTE) 1 % ophthalmic suspension Place 1 drop into the left eye every 2 (two) hours while awake.     tacrolimus (PROGRAF) 0.5 MG capsule Take 0.5-1 mg by mouth See admin instructions. Take 2 capsules (1mg ) by mouth every morning and take 1 capsule (0.5mg ) by mouth every night     tiZANidine (ZANAFLEX) 2 MG tablet Take 1 tablet (2 mg total) by mouth 3 times/Kristen as needed-between  meals & bedtime for muscle spasms. 10 tablet 0   alendronate (FOSAMAX) 70 MG tablet Take 1 tablet (70 mg total) by mouth every 7 (seven) days. Take with a full glass of water on an empty stomach.     hydrochlorothiazide (HYDRODIURIL) 12.5 MG tablet Take 1 tablet (12.5 mg total) by mouth daily. 90 tablet 3   memantine (NAMENDA) 5 MG tablet Take 1 tablet (5 mg total) by mouth daily. 30 tablet 6   pantoprazole (PROTONIX) 20 MG tablet Take 1 tablet by mouth once daily 90 tablet  0   pravastatin (PRAVACHOL) 80 MG tablet Take 1 tablet (80 mg total) by mouth every evening. 90 tablet 3   No facility-administered medications prior to visit.     Per HPI unless specifically indicated in ROS section below Review of Systems  Constitutional:  Negative for activity change, appetite change, chills, fatigue, fever and unexpected weight change.  HENT:  Negative for hearing loss.   Eyes:  Negative for visual disturbance.  Respiratory:  Negative for cough, chest tightness, shortness of breath and wheezing.   Cardiovascular:  Negative for chest pain, palpitations and leg swelling.  Gastrointestinal:  Positive for constipation. Negative for abdominal distention, abdominal pain, blood in stool, diarrhea, nausea and vomiting.       Bowel changes  Genitourinary:  Negative for difficulty urinating and hematuria.  Musculoskeletal:  Negative for arthralgias, myalgias and neck pain.  Skin:  Negative for rash.  Neurological:  Negative for dizziness, seizures, syncope and headaches.  Hematological:  Negative for adenopathy. Does not bruise/bleed easily.  Psychiatric/Behavioral:  Negative for dysphoric mood. The patient is not nervous/anxious.     Objective:  BP 116/70   Pulse 72   Temp 98.2 F (36.8 C) (Oral)   Ht 5' 0.75" (1.543 m)   Wt 129 lb 6 oz (58.7 kg)   LMP  (LMP Unknown)   SpO2 99%   BMI 24.65 kg/m   Wt Readings from Last 3 Encounters:  08/21/23 129 lb 6 oz (58.7 kg)  07/31/23 132 lb (59.9 kg)   07/04/23 132 lb (59.9 kg)      Physical Exam Vitals and nursing note reviewed. Exam conducted with a chaperone present.  Constitutional:      Appearance: Normal appearance. She is not ill-appearing.  HENT:     Head: Normocephalic and atraumatic.     Right Ear: Tympanic membrane, ear canal and external ear normal. There is no impacted cerumen.     Left Ear: Tympanic membrane, ear canal and external ear normal. There is no impacted cerumen.     Mouth/Throat:     Mouth: Mucous membranes are moist.     Pharynx: Oropharynx is clear. No oropharyngeal exudate or posterior oropharyngeal erythema.  Eyes:     General:        Right eye: No discharge.        Left eye: No discharge.     Extraocular Movements: Extraocular movements intact.     Conjunctiva/sclera: Conjunctivae normal.     Pupils: Pupils are equal, round, and reactive to light.  Neck:     Thyroid: No thyroid mass or thyromegaly.     Vascular: No carotid bruit.  Cardiovascular:     Rate and Rhythm: Normal rate and regular rhythm.     Pulses: Normal pulses.     Heart sounds: Normal heart sounds. No murmur heard. Pulmonary:     Effort: Pulmonary effort is normal. No respiratory distress.     Breath sounds: Normal breath sounds. No wheezing, rhonchi or rales.  Abdominal:     General: Bowel sounds are normal. There is no distension.     Palpations: Abdomen is soft. There is no mass.     Tenderness: There is no abdominal tenderness. There is no guarding or rebound.     Hernia: No hernia is present.  Genitourinary:    Exam position: Lithotomy position.     Labia:        Right: No rash or tenderness.        Left: No rash  or tenderness.      Vagina: Normal.     Cervix: Normal.     Uterus: Normal.      Adnexa: Right adnexa normal and left adnexa normal.     Comments: Pap performed on cervix Musculoskeletal:     Cervical back: Normal range of motion and neck supple. No rigidity.     Right lower leg: No edema.     Left lower  leg: No edema.  Lymphadenopathy:     Cervical: No cervical adenopathy.  Skin:    General: Skin is warm and dry.     Findings: No rash.  Neurological:     General: No focal deficit present.     Mental Status: She is alert. Mental status is at baseline.  Psychiatric:        Mood and Affect: Mood normal.        Behavior: Behavior normal.       Results for orders placed or performed in visit on 08/14/23  VITAMIN D 25 Hydroxy (Vit-D Deficiency, Fractures)  Result Value Ref Range   VITD 47.06 30.00 - 100.00 ng/mL  Comprehensive metabolic panel  Result Value Ref Range   Sodium 142 135 - 145 mEq/L   Potassium 4.0 3.5 - 5.1 mEq/L   Chloride 108 96 - 112 mEq/L   CO2 24 19 - 32 mEq/L   Glucose, Bld 141 (H) 70 - 99 mg/dL   BUN 27 (H) 6 - 23 mg/dL   Creatinine, Ser 8.65 0.40 - 1.20 mg/dL   Total Bilirubin 0.7 0.2 - 1.2 mg/dL   Alkaline Phosphatase 70 39 - 117 U/L   AST 12 0 - 37 U/L   ALT 12 0 - 35 U/L   Total Protein 6.8 6.0 - 8.3 g/dL   Albumin 4.3 3.5 - 5.2 g/dL   GFR 78.46 >96.29 mL/min   Calcium 10.0 8.4 - 10.5 mg/dL  Lipid panel  Result Value Ref Range   Cholesterol 154 0 - 200 mg/dL   Triglycerides 52.8 0.0 - 149.0 mg/dL   HDL 41.32 >44.01 mg/dL   VLDL 02.7 0.0 - 25.3 mg/dL   LDL Cholesterol 91 0 - 99 mg/dL   Total CHOL/HDL Ratio 3    NonHDL 104.36   Microalbumin / creatinine urine ratio  Result Value Ref Range   Microalb, Ur 7.6 (H) 0.0 - 1.9 mg/dL   Creatinine,U 66.4 mg/dL   Microalb Creat Ratio 9.4 0.0 - 30.0 mg/g  Hemoglobin A1c  Result Value Ref Range   Hgb A1c MFr Bld 7.3 (H) 4.6 - 6.5 %   Lab Results  Component Value Date   WBC 6.4 02/23/2023   HGB 12.9 02/23/2023   HCT 40.9 02/23/2023   MCV 85.0 02/23/2023   PLT 208.0 02/23/2023    Assessment & Plan:   Problem List Items Addressed This Visit     History of renal transplant (Chronic)   Health maintenance examination - Primary (Chronic)    Preventative protocols reviewed and updated unless pt  declined. Discussed healthy diet and lifestyle.  Pap smear today, # provided to call and schedule mammogram.       Immunosuppressed status (HCC) (Chronic)   Vitamin D deficiency    Continue vit D 2000 international units  daily.      Primary open angle glaucoma (POAG) of both eyes, severe stage    Regularly sees [facility] eye doctor      Type 2 diabetes mellitus with other specified complication (HCC)    Chronic,  deterioration discussed. Encouraged renewed efforts towards diabetic diet. RTC 3 mo DM f/u visit.       Relevant Medications   pravastatin (PRAVACHOL) 80 MG tablet   Hyperlipidemia associated with type 2 diabetes mellitus (HCC)    Chronic, stable on pravastatin - continue. The 10-year ASCVD risk score (Arnett DK, et al., 2019) is: 17.7%   Values used to calculate the score:     Age: 7 years     Sex: Female     Is Non-Hispanic African American: Yes     Diabetic: Yes     Tobacco smoker: No     Systolic Blood Pressure: 116 mmHg     Is BP treated: Yes     HDL Cholesterol: 49.7 mg/dL     Total Cholesterol: 154 mg/dL       Relevant Medications   pravastatin (PRAVACHOL) 80 MG tablet   hydrochlorothiazide (HYDRODIURIL) 12.5 MG tablet   GERD (gastroesophageal reflux disease)    Continue pantoprazole 20mg  daily with pepcid at night.       Relevant Medications   pantoprazole (PROTONIX) 20 MG tablet   Mixed incontinence urge and stress    Has seen urology, now seems off Myrbetriq.       Incontinence of feces    Sees gen surgery s/p sacral nerve stimulator placement 06/2022      MCI (mild cognitive impairment) with memory loss    Overall stable period  She stopped namenda due to dry mouth.  Avoiding Aricept in GERD history       Osteoporosis    Latest DEXA showing T -2.6 at forearm. Continues fosamax started by renal 2021.       Relevant Medications   alendronate (FOSAMAX) 70 MG tablet   ASCUS of cervix with negative high risk HPV    ASCUS noted 03/2020.   Repeat pap smear today. If normal, discussed stopping screening.       Essential hypertension    Chronic, stable. Continue amlodipine, hydrochlorothiazide, carvedilol.       Relevant Medications   pravastatin (PRAVACHOL) 80 MG tablet   hydrochlorothiazide (HYDRODIURIL) 12.5 MG tablet   Type II diabetes mellitus with renal manifestations (HCC)   Relevant Medications   pravastatin (PRAVACHOL) 80 MG tablet   Other Visit Diagnoses     Encounter for annual routine gynecological examination       Relevant Orders   Cytology - PAP        Meds ordered this encounter  Medications   pantoprazole (PROTONIX) 20 MG tablet    Sig: Take 1 tablet (20 mg total) by mouth daily.    Dispense:  90 tablet    Refill:  4   pravastatin (PRAVACHOL) 80 MG tablet    Sig: Take 1 tablet (80 mg total) by mouth every evening.    Dispense:  90 tablet    Refill:  4   alendronate (FOSAMAX) 70 MG tablet    Sig: Take 1 tablet (70 mg total) by mouth every 7 (seven) days. Take with a full glass of water on an empty stomach.    Dispense:  12 tablet    Refill:  4   hydrochlorothiazide (HYDRODIURIL) 12.5 MG tablet    Sig: Take 1 tablet (12.5 mg total) by mouth daily.    Dispense:  90 tablet    Refill:  4    No orders of the defined types were placed in this encounter.   Patient Instructions  Cervical cancer screening with pap smear today  Call to schedule mammogram at your convenience: [facility] [phone] Sugar was too high - limit added sugars and carbs in diet - watch portion sizes. Return in 3 months for diabetes follow up visit  Good to see you today  Follow up plan: Return in about 3 months (around 11/21/2023) for follow up visit.  Eustaquio Boyden, MD

## 2023-08-21 NOTE — Patient Instructions (Addendum)
Cervical cancer screening with pap smear today  Call to schedule mammogram at your convenience: [facility] at [facility] [phone] Sugar was too high - limit added sugars and carbs in diet - watch portion sizes. Return in 3 months for diabetes follow up visit  Good to see you today

## 2023-08-22 ENCOUNTER — Ambulatory Visit: Payer: Medicare Other | Admitting: Orthopedic Surgery

## 2023-08-22 ENCOUNTER — Encounter: Payer: Self-pay | Admitting: Orthopedic Surgery

## 2023-08-22 ENCOUNTER — Telehealth: Payer: Self-pay

## 2023-08-22 ENCOUNTER — Ambulatory Visit: Payer: Medicare Other

## 2023-08-22 VITALS — BP 112/68 | Ht 60.0 in | Wt 129.0 lb

## 2023-08-22 DIAGNOSIS — M47816 Spondylosis without myelopathy or radiculopathy, lumbar region: Secondary | ICD-10-CM

## 2023-08-22 DIAGNOSIS — M4726 Other spondylosis with radiculopathy, lumbar region: Secondary | ICD-10-CM | POA: Diagnosis not present

## 2023-08-22 DIAGNOSIS — M4316 Spondylolisthesis, lumbar region: Secondary | ICD-10-CM

## 2023-08-22 DIAGNOSIS — M48061 Spinal stenosis, lumbar region without neurogenic claudication: Secondary | ICD-10-CM

## 2023-08-22 DIAGNOSIS — M5416 Radiculopathy, lumbar region: Secondary | ICD-10-CM

## 2023-08-22 NOTE — Assessment & Plan Note (Signed)
Has seen urology, now seems off Myrbetriq.

## 2023-08-22 NOTE — Assessment & Plan Note (Signed)
Chronic, deterioration discussed. Encouraged renewed efforts towards diabetic diet. RTC 3 mo DM f/u visit.

## 2023-08-22 NOTE — Assessment & Plan Note (Signed)
Chronic, stable on pravastatin - continue. The 10-year ASCVD risk score (Arnett DK, et al., 2019) is: 17.7%   Values used to calculate the score:     Age: 72 years     Sex: Female     Is Non-Hispanic African American: Yes     Diabetic: Yes     Tobacco smoker: No     Systolic Blood Pressure: 116 mmHg     Is BP treated: Yes     HDL Cholesterol: 49.7 mg/dL     Total Cholesterol: 154 mg/dL

## 2023-08-22 NOTE — Telephone Encounter (Signed)
Patient Name: Kristen Kirk MRN: [id number] DOB:[birth year], 72 y.o., female Today's Date: 08/22/2023  Phone call to patient as she did not show for her 9:30 appointment. She answered stating she had a follow up with her MD and missed her PT. Reminded her of her next scheduled visit on 09/03/2023 at 4:15 pm. She verbalized understanding.    Lenda Kelp, PT 08/22/2023, 10:05 AM

## 2023-08-22 NOTE — Assessment & Plan Note (Addendum)
ASCUS noted 03/2020.  Repeat pap smear today. If normal, discussed stopping screening.

## 2023-08-22 NOTE — Assessment & Plan Note (Addendum)
Regularly sees [facility] doctor

## 2023-08-22 NOTE — Assessment & Plan Note (Signed)
Sees gen surgery s/p sacral nerve stimulator placement 06/2022

## 2023-08-22 NOTE — Assessment & Plan Note (Signed)
Chronic, stable. Continue amlodipine, hydrochlorothiazide, carvedilol.

## 2023-08-22 NOTE — Assessment & Plan Note (Signed)
Continue vit D 2000 international units daily.

## 2023-08-22 NOTE — Assessment & Plan Note (Addendum)
Latest DEXA showing T -2.6 at forearm. Continues fosamax started by renal 2021.

## 2023-08-22 NOTE — Assessment & Plan Note (Addendum)
Overall stable period  She stopped namenda due to dry mouth.  Avoiding Aricept in GERD history

## 2023-08-22 NOTE — Assessment & Plan Note (Addendum)
Continue pantoprazole 20mg  daily with pepcid at night.

## 2023-08-22 NOTE — Progress Notes (Addendum)
Referring Physician:  Eustaquio Boyden, MD [address] [place],  [place] [address]  Primary Physician:  Eustaquio Boyden, MD  History of Present Illness: [name] has a history of renal transplant, HTN, hyperlipidemia, glaucoma, CKD, DM, osteoporosis.    She has sacral stimulator.  Last seen by me on 07/04/23 for left lateral leg pain. She has known lumbar spondylosis with severe central stenosis L3-L5. Also with multilevel foraminal stenosis.   She has started PT and has done 6 sessions. She had MBB to the left L5-S1 and L4-5 facet joints on 08/01/23.   She is here for follow up.   Primary complaint is constant LBP that is worse in the morning. Some relief once she gets moving. She has intermittent left lateral thigh pain. Pain is also worse with prolonged standing or walking. No numbness, tingling, or weakness in legs.   She had some relief with above MBB, but it was short term. She is in PT and has some short term relief.   No NSAIDs due to h/p renal transplant and CKD. She is prn tylenol.   Bowel/Bladder Dysfunction: she has urinary frequency. History of bowel incontinence for years- has sacral stimulator.   Conservative measures:  Physical therapy: initial PT eval on 07/09/23 and has done 5 additional visits (last on 08/15/23)  Multimodal medical therapy including regular antiinflammatories: prednisone, tylenol, ultram, tylenol #3, zanaflex, flexeril Injections:  08/01/2023: MBB to the left L5-S1 and L4-5 facet joints (8/10 to 1/10)  06/05/2023: Left L5-S1 transforaminal ESI 02/04/2020: Left L5-S1 TF ESI   Past Surgery: no previous spinal surgeries   Kristen Kirk has no symptoms of cervical myelopathy.  The symptoms are causing a significant impact on the patient's life.   Review of Systems:  A 10 point review of systems is negative, except for the pertinent positives and negatives detailed in the HPI.  Past Medical History: Past Medical  History:  Diagnosis Date   Anemia in chronic kidney disease    a. s/p aranesp 11/2013 now starting epo with HD 06/2014   Diabetes mellitus type II    a. Currently diet controlled since losing weight.   DJD (degenerative joint disease)    Essential hypertension    GERD (gastroesophageal reflux disease)    Glaucoma, both eyes    a. L>R   History of echocardiogram    a. 01/2015 [facility]): EF ?55%, triv MR, mildly dil LA, nl PV.   History of end stage renal disease    previous on HD;   received kidney transplant 07/ 2018 (on dialysis since 2015 MWF)  followed by [facility] renal (Dr. Linus Salmons) considering transplant Access: LUE fistula Establishing with [place] [facility]   History of stress test    a. 03/2013 [facility]): EF 63%, no ischemia.   History of syncope    a. 12/2015.   History of Urge Incontinence    HLD (hyperlipidemia)    Osteoarthritis    Osteoporosis 05/03/2019   DEXA 04/2019 - 1.5 spine, -2.1 hip, -2.5 forearm Check phosphate and PTH to guide further management in h/o kidney transplant   S/P kidney transplant 04/2017   @[facility] ---   per pt one kidney from living-donor   Vaginal atrophy    a. vagifem caused spotting, normal TVS   Vitamin D deficiency    Wears glasses    stated on 05/31/2022   Wears partial dentures    upper 2 teeth. 05/31/2022    Past Surgical History: Past Surgical  History:  Procedure Laterality Date   ANAL RECTAL MANOMETRY N/A 03/16/2021   Procedure: ANO RECTAL MANOMETRY;  Surgeon: Napoleon Form, MD;  Location: [facility];  Service: Endoscopy;  Laterality: N/A;   BLADDER SUSPENSION  2011   for stress incontinence-Dr. MacDiarmid   BREAST BIOPSY Right 12/15/2020   fat necrosis   CATARACT EXTRACTION W/ INTRAOCULAR LENS IMPLANT Bilateral    left 02-25-2021;  right ?   COLONOSCOPY  03/2019   diverticulosis, rpt 10 yrs (Nandigam)   KIDNEY TRANSPLANT Right 04/03/2017   [facility])   left eye laser surgery  06/2011   LIGATION OF ARTERIOVENOUS   FISTULA  10/2017   thrombosed L arm AFV   NEPHRECTOMY TRANSPLANTED ORGAN     REFRACTIVE SURGERY     TUBAL LIGATION Bilateral    1990s    Allergies: Allergies as of 08/20/2023 - Review Complete 08/15/2023  Allergen Reaction Noted   Brimonidine  03/02/2014   Shellfish-derived products Swelling 03/02/2014   Tramadol Other (See Comments) 01/05/2020    Medications: Outpatient Encounter Medications as of 08/20/2023  Medication Sig   acetaminophen (TYLENOL) 500 MG tablet Take 1 tablet (500 mg total) by mouth 3 (three) times daily as needed for moderate pain.   alendronate (FOSAMAX) 70 MG tablet Take 1 tablet (70 mg total) by mouth every 7 (seven) days. Take with a full glass of water on an empty stomach.   amLODipine (NORVASC) 2.5 MG tablet Take 2.5 mg by mouth daily.   apraclonidine (IOPIDINE) 0.5 % ophthalmic solution Place 1 drop into the right eye daily.   aspirin 81 MG tablet Take 81 mg by mouth daily.   Blood Glucose Monitoring Suppl (ONE TOUCH ULTRA SYSTEM KIT) W/DEVICE KIT 1 kit by Does not apply route once.   carvedilol (COREG) 25 MG tablet Take 12.5 mg by mouth 2 (two) times daily with a meal.   Cholecalciferol (VITAMIN D3) 1000 units CAPS Take 2 capsules (2,000 Units total) by mouth daily.   docusate sodium (COLACE) 100 MG capsule Take 100 mg by mouth 2 (two) times daily as needed for mild constipation or moderate constipation.   dorzolamide-timolol (COSOPT) 2-0.5 % ophthalmic solution Apply to eye.   famotidine (PEPCID) 20 MG tablet Take 1 tablet (20 mg total) by mouth at bedtime.   glucose blood test strip Check sugars once daily   hydrochlorothiazide (HYDRODIURIL) 12.5 MG tablet Take 1 tablet (12.5 mg total) by mouth daily.   ketorolac (ACULAR) 0.5 % ophthalmic solution Place 1 drop into the left eye 4 (four) times daily.   latanoprost (XALATAN) 0.005 % ophthalmic solution Place 1 drop into the right eye at bedtime.   Melatonin 3 MG CAPS Take 1-2 capsules (3-6 mg total) by  mouth at bedtime as needed (sleep).   memantine (NAMENDA) 5 MG tablet Take 1 tablet (5 mg total) by mouth daily.   Multiple Vitamin (MULTIVITAMIN) tablet Take 1 tablet by mouth daily.   mycophenolate (CELLCEPT) 500 MG tablet Take 500 mg by mouth 2 (two) times daily.   NEOMYCIN-POLYMYXIN-HYDROCORTISONE (CORTISPORIN) 1 % SOLN OTIC solution Apply 1-2 drops to toe BID after soaking   pantoprazole (PROTONIX) 20 MG tablet Take 1 tablet by mouth once daily   pravastatin (PRAVACHOL) 80 MG tablet Take 1 tablet (80 mg total) by mouth every evening.   prednisoLONE acetate (PRED FORTE) 1 % ophthalmic suspension Place 1 drop into the left eye every 2 (two) hours while awake.   tacrolimus (PROGRAF) 0.5 MG capsule Take 0.5-1 mg  by mouth See admin instructions. Take 2 capsules (1mg ) by mouth every morning and take 1 capsule (0.5mg ) by mouth every night   tiZANidine (ZANAFLEX) 2 MG tablet Take 1 tablet (2 mg total) by mouth 3 times/day as needed-between meals & bedtime for muscle spasms.   No facility-administered encounter medications on file as of 08/20/2023.    Social History: Social History   Tobacco Use   Smoking status: Former    Current packs/day: 0.00    Types: Cigarettes    Quit date: 1987    Years since quitting: 37.8   Smokeless tobacco: Never   Tobacco comments:    Remote- quit ~ late 1980's.  Vaping Use   Vaping status: Never Used  Substance Use Topics   Alcohol use: No    Alcohol/week: 0.0 standard drinks of alcohol   Drug use: No    Family Medical History: Family History  Problem Relation Age of Onset   Other Father        she did not know her father.   Diabetes Mother    Heart failure Mother        died @ 64   Stroke Brother    Hyperlipidemia Brother    Hypertension Brother    Hyperlipidemia Brother    Hypertension Brother    Hyperlipidemia Sister    Hypertension Sister    Hyperlipidemia Sister    Hypertension Sister    Heart failure Maternal Grandfather    Breast  cancer Neg Hx    Colon cancer Neg Hx    Esophageal cancer Neg Hx    Stomach cancer Neg Hx    Rectal cancer Neg Hx     Physical Examination: There were no vitals filed for this visit.    Awake, alert, oriented to person, place, and time.  Speech is clear and fluent. Fund of knowledge is appropriate.   Cranial Nerves: Pupils equal round and reactive to light.  Facial tone is symmetric.    minimal lower lumbar tenderness.   No abnormal lesions on exposed skin.   Strength: Side Iliopsoas Quads Hamstring PF DF EHL  R 5 5 5 5 5 5   L 5 5 5 5 5 5    Reflexes are 2+ and symmetric at the patella and achilles.    Clonus is not present.   Bilateral lower extremity sensation is intact to light touch.     She has good ROM of left and right hip with no pain.   Gait is normal.     Medical Decision Making  Imaging: None  Assessment and Plan: Ms. Fistler is a pleasant 72 y.o. female  with a primary complaint of constant LBP that is worse in the morning and with prolonged standing/walking.  She has known lumbar spondylosis with severe central stenosis L3-L5. Also with multilevel foraminal stenosis.   She had short term relief with MBB. She is in PT for her lumbar spine.   Treatment options discussed with patient and following plan made:   - Agree with repeat MBB. She would like to avoid surgery if possible.  - Continue with PT for lumbar spine.  - Medications limited as she has history of renal transplant.  - Phone visit follow up in January. If no better, consider CT myelogram and possible follow up with Dr. Myer Haff or Dr. Katrinka Blazing to discuss possible surgery options.   I spent a total of 15 minutes in face-to-face and non-face-to-face activities related to this patient's care today including review of outside  records, review of imaging, review of symptoms, physical exam, discussion of differential diagnosis, discussion of treatment options, and documentation.   Drake Leach  PA-C Dept. of Neurosurgery

## 2023-08-24 LAB — CYTOLOGY - PAP
Comment: NEGATIVE
Comment: NORMAL
Diagnosis: NEGATIVE
High risk HPV: NEGATIVE
Neisseria Gonorrhea: NEGATIVE

## 2023-08-28 ENCOUNTER — Encounter: Payer: Self-pay | Admitting: *Deleted

## 2023-09-05 ENCOUNTER — Ambulatory Visit: Payer: Medicare Other

## 2023-09-06 DIAGNOSIS — M47816 Spondylosis without myelopathy or radiculopathy, lumbar region: Secondary | ICD-10-CM | POA: Diagnosis not present

## 2023-09-10 ENCOUNTER — Ambulatory Visit: Payer: Medicare Other

## 2023-09-12 ENCOUNTER — Ambulatory Visit: Payer: Medicare Other | Attending: Orthopedic Surgery | Admitting: Physical Therapy

## 2023-09-12 DIAGNOSIS — R293 Abnormal posture: Secondary | ICD-10-CM | POA: Diagnosis not present

## 2023-09-12 DIAGNOSIS — M5459 Other low back pain: Secondary | ICD-10-CM | POA: Diagnosis not present

## 2023-09-12 DIAGNOSIS — R278 Other lack of coordination: Secondary | ICD-10-CM | POA: Diagnosis not present

## 2023-09-12 DIAGNOSIS — M6281 Muscle weakness (generalized): Secondary | ICD-10-CM | POA: Diagnosis not present

## 2023-09-12 DIAGNOSIS — R2689 Other abnormalities of gait and mobility: Secondary | ICD-10-CM | POA: Insufficient documentation

## 2023-09-12 NOTE — Therapy (Addendum)
OUTPATIENT PHYSICAL THERAPY THORACOLUMBAR TREATMENT/ RECERT   Patient Name: Kristen Kirk MRN: [id number] DOB:1951/04/04, 72 y.o., female Today's Date: 09/12/2023  END OF SESSION:  PT End of Session - 09/12/23 1404     Visit Number 7    Number of Visits 16    Date for PT Re-Evaluation 10/29/2023   PT Start Time 1402    PT Stop Time 1443    PT Time Calculation (min) 41 min    Equipment Utilized During Treatment Gait belt    Activity Tolerance Patient tolerated treatment well    Behavior During Therapy WFL for tasks assessed/performed                Past Medical History:  Diagnosis Date   Anemia in chronic kidney disease    a. s/p aranesp 11/2013 now starting epo with HD 06/2014   Diabetes mellitus type II    a. Currently diet controlled since losing weight.   DJD (degenerative joint disease)    Essential hypertension    GERD (gastroesophageal reflux disease)    Glaucoma, both eyes    a. L>R   History of echocardiogram    a. 01/2015 Echo [facility]): EF ?55%, triv MR, mildly dil LA, nl PV.   History of end stage renal disease    previous on HD;   received kidney transplant 07/ 2018 (on dialysis since 2015 MWF)  followed by [facility] renal (Dr. Linus Salmons) considering transplant Access: LUE fistula Establishing with [facility]   History of stress test    a. 03/2013 Myoview [facility]): EF 63%, no ischemia.   History of syncope    a. 12/2015.   History of Urge Incontinence    HLD (hyperlipidemia)    Osteoarthritis    Osteoporosis 05/03/2019   DEXA 04/2019 - 1.5 spine, -2.1 hip, -2.5 forearm Check phosphate and PTH to guide further management in h/o kidney transplant   S/P kidney transplant 04/2017   @[facility] ---   per pt one kidney from living-donor   Vaginal atrophy    a. vagifem caused spotting, normal TVS   Vitamin D deficiency    Wears glasses    stated on 05/31/2022   Wears partial dentures    upper 2 teeth. 05/31/2022   Past Surgical History:  Procedure Laterality  Date   ANAL RECTAL MANOMETRY N/A 03/16/2021   Procedure: ANO RECTAL MANOMETRY;  Surgeon: Napoleon Form, MD;  Location: [facility];  Service: Endoscopy;  Laterality: N/A;   BLADDER SUSPENSION  2011   for stress incontinence-Dr. MacDiarmid   BREAST BIOPSY Right 12/15/2020   fat necrosis   CATARACT EXTRACTION W/ INTRAOCULAR LENS IMPLANT Bilateral    left 02-25-2021;  right ?   COLONOSCOPY  03/2019   diverticulosis, rpt 10 yrs (Nandigam)   KIDNEY TRANSPLANT Right 04/03/2017   [facility])   left eye laser surgery  06/2011   LIGATION OF ARTERIOVENOUS  FISTULA  10/2017   thrombosed L arm AFV   NEPHRECTOMY TRANSPLANTED ORGAN     REFRACTIVE SURGERY     TUBAL LIGATION Bilateral    1990s   Patient Active Problem List   Diagnosis Date Noted   Secondary corneal edema of left eye 08/02/2023   Cystoid macular degeneration, 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   Anemia in chronic kidney disease    Essential hypertension    UTI (urinary tract infection)    Type II diabetes mellitus with renal manifestations (HCC)    Immunosuppressed status (HCC) 04/15/2022   Health maintenance examination 04/14/2022  Constipation due to outlet dysfunction    Acquired leg length discrepancy 01/28/2021   ASCUS of cervix with negative high risk HPV 10/01/2020   Thoracic spine pain 01/05/2020   Chronic lower back pain 10/08/2019   Osteoporosis 05/03/2019   Advanced care planning/counseling discussion 03/20/2019   MCI (mild cognitive impairment) with memory loss 03/20/2019   History of renal transplant 04/01/2017   Incontinence of feces 01/11/2017    Class: Acute   Insomnia 01/14/2013   Xerostomia 01/29/2012   Medicare annual wellness visit, subsequent 07/20/2011   Vaginal atrophy 04/17/2011   Type 2 diabetes mellitus with other specified complication (HCC)    Hyperlipidemia associated with type 2 diabetes mellitus (HCC)    GERD (gastroesophageal reflux disease)    Mixed incontinence urge and stress    Vitamin D  deficiency 05/19/2009   MUSCLE CRAMPS 02/22/2009   Alopecia 10/05/2008   Vertigo 07/31/2008   Osteoarthritis 01/03/2007   Primary open angle glaucoma (POAG) of both eyes, severe stage 10/03/1999    PCP: Eustaquio Boyden MD  REFERRING PROVIDER: Liana Gerold MD   REFERRING DIAG: lumbar radiculopathy, lumbar spondylosis, spondylolisthesis of lumbar spine, spinal stenosis of lumbar region  Rationale for Evaluation and Treatment: Rehabilitation  THERAPY DIAG:  Other low back pain  Muscle weakness (generalized)  ONSET DATE: 1-2 weeks ago new pain began.   SUBJECTIVE:                                                                                                                                                                                           SUBJECTIVE STATEMENT: Patient reports some left sided low back pain and feels the injection she got was not effective.   PERTINENT HISTORY:  Patient presents to PT for evaluation of lumbar radiculopathy, lumbar spondylosis, spondylolisthesis of lumbar spine, spinal stenosis of lumbar region. Pmh includes renal transplant (2018), HTN, HLD, glaucoma, CKD, DM, osteoporosis, anemia, DM type II, DJD, GERD, vaginal atrophy, . She has a sacral stimulator. Per physician note she has one month of constant lateral left leg pain to her knee with pain worsening in the morning and with walking. In 2021 had L L5-S1 TF ESI. Patient reports pain is primarily in L hip, reports she was given a shot. Reports pain goes to back of her knee.   PAIN:  Are you having pain? yes This AM 9/10, 8/10 now  Worst Pain: 8/10  Least pain: 3-4/10   PRECAUTIONS: None  RED FLAGS: Bowel or bladder incontinence: Yes: fecal incontinence     WEIGHT BEARING RESTRICTIONS: No  FALLS:  Has patient fallen in last 6 months? No  LIVING ENVIRONMENT:  Lives with: lives with their spouse Lives in: House/apartment Stairs:  basement downstairs,  two stories, has a chair lift   Has following equipment at home:  chair lift   OCCUPATION: retired   PLOF: Independent  PATIENT GOALS: to reduce pain     OBJECTIVE:  Note: Objective measures were completed at Evaluation unless otherwise noted.  DIAGNOSTIC FINDINGS:  EXAM: CT LUMBAR SPINE WITHOUT CONTRAST   TECHNIQUE: Multidetector CT imaging of the lumbar spine was performed without intravenous contrast administration. Multiplanar CT image reconstructions were also generated.   RADIATION DOSE REDUCTION: This exam was performed according to the departmental dose-optimization program which includes automated exposure control, adjustment of the mA and/or kV according to patient size and/or use of iterative reconstruction technique.   COMPARISON:  01/13/2020   FINDINGS: Segmentation: 5 lumbar type vertebrae.   Alignment: Grade 1 anterolisthesis of L4 on L5 secondary to facet disease. 2 mm retrolisthesis of L2 on L3 and L1 on L2.   Vertebrae: No acute fracture or aggressive osseous lesion.   Paraspinal and other soft tissues: Abdominal aortic atherosclerosis. Bilateral renal atrophy.   Other: None   Disc levels:   Disc spaces: Degenerative disease with disc height loss at T11-12, L1-2, L2-3, L4-5 and L5-S1.   T12-L1: Mild broad-based disc bulge. Mild bilateral facet arthropathy. No foraminal or central canal stenosis.   L1-L2: Broad-based disc bulge. Moderate bilateral facet arthropathy. Mild right foraminal stenosis. No left foraminal stenosis. No spinal stenosis.   L2-L3: Broad-based disc bulge. Mild bilateral facet arthropathy. Moderate spinal stenosis. Bilateral subarticular recess stenosis. Moderate bilateral foraminal stenosis.   L3-L4: Broad-based disc bulge. Moderate bilateral facet arthropathy. Severe spinal stenosis. Severe bilateral foraminal stenosis.   L4-L5: Broad-based disc bulge. Severe bilateral facet arthropathy. Mild bilateral foraminal stenosis. Severe spinal  stenosis.   L5-S1: Broad-based disc bulge. Severe left and mild right facet arthropathy. Severe bilateral foraminal stenosis.   IMPRESSION: 1. Lumbar spine spondylosis as described above. 2. No acute osseous injury of the lumbar spine. 3.  Aortic Atherosclerosis (ICD10-I70.0).    PATIENT SURVEYS:  FOTO 49  SCREENING FOR RED FLAGS: Bowel or bladder incontinence: Yes:     COGNITION: Overall cognitive status: Within functional limits for tasks assessed     SENSATION: Decreased R adductor region   MUSCLE LENGTH:  Thomas test limited bilaterally   POSTURE: anterior pelvic tilt  PALPATION: Significant tension of L piriformis and quadratus lumborum   LUMBAR ROM:   AROM eval  Flexion 54  Extension 6  Right lateral flexion Limited 50%  Left lateral flexion Limited 50 %  Right rotation Limited 50%  Left rotation Limited 50%    (Blank rows = not tested)    LOWER EXTREMITY MMT:    MMT Right eval Left eval  Hip flexion 4+ 4+  Hip extension    Hip abduction 4+ 4+  Hip adduction 4+ 4+  Hip internal rotation    Hip external rotation    Knee flexion 5 5  Knee extension 4+ 4+  Ankle dorsiflexion 5 5  Ankle plantarflexion 4+ 4+  Ankle inversion    Ankle eversion     (Blank rows = not tested)  LUMBAR SPECIAL TESTS:  Straight leg raise test: Positive, SI Compression/distraction test: Positive, and FABER test: Negative  FUNCTIONAL TESTS:  5 times sit to stand: 15.415 10 meter walk test: 10.94sec 0.931m/s     GAIT: Distance walked: 30 ft Assistive device utilized: None Level of assistance: Complete Independence Comments: antalgic gait pattern  TODAY'S TREATMENT:                                                                                                                              DATE: 09/12/23  LBP rated at 8/10 on NPS prior to treatment  Therex:  Supine:  LTR 10 x ea side for 10 sec  PROM - knee to chest - hold 30 sec x 4 each LE PROM- Figure 4-  hold 30 sec x 4 each LE PROM- Piriformis - hold 30 sec x 4 each LE PROM- Hamstring -hold 30 sec x 2 each LE Nerve flossing 2 x 20 reps sciatic nerve L side only  Ppt 2 x 10 x 3 sec holds  Bridge 2 x 10, cues for neutral spine posture and range of movement  Instruction in log roll technique for getting out of bed and practice with this, pt reports less pain compared to getting out of bed normally.   LBP rated at 4/10 on NPS after session.   Goals to be reassessed and updated if necessary next session.    PATIENT EDUCATION:  Education details: goals, POC, HEP. Pt educated throughout session about proper posture and technique with exercises. Improved exercise technique, movement at target joints, use of target muscles after min to mod verbal, visual, tactile cues.  Person educated: Patient Education method: Explanation, Demonstration, Tactile cues, and Verbal cues Education comprehension: verbalized understanding, returned demonstration, verbal cues required, and tactile cues required  HOME EXERCISE PROGRAM:  Access Code: [email or web address] URL: [email or web address] Date: 08/15/2023 Prepared by: Maureen Ralphs  Exercises - Supine Lower Trunk Rotation  - 1-2 x daily - 3 sets - 30 hold - Hooklying Single Knee to Chest Stretch  - 1 x daily - 3 sets - 30 hold - Seated Piriformis Stretch with Trunk Bend  - 1-2 x daily - 3 sets - 30 hold - Seated Hamstring Stretch  - 1-2 x daily - 3 sets - 30 hold    Access Code: [facility] URL: [email or web address] Prepared by: Precious Bard  Exercises - Supine Lower Trunk Rotation  - 7 x weekly - 1 x daily - 2 sets - 10 reps - 5 hold - Supine Posterior Pelvic Tilt  - 7 x weekly - 1 x daily - 2 sets - 10 reps - 5 hold - Supine Piriformis Stretch with Foot on Ground  - 1 x daily - 7 x weekly - 2 sets - 1 reps - 30 hold     ASSESSMENT:  CLINICAL IMPRESSION:  Pt reports continued pain in her low back. Pain in alleviated  with PT and pt instructed in importance of HEP completion although this has been hard with her husband being admitted to [facility] hospital currently. Will continue to work on pain relief techniques with core strengthening as pt can tolerate. Patient will continue to benefit from skilled physical therapy to reduce pain and improve mobility to return to PLOF. Goals for recert to be  reassessed next session.     OBJECTIVE IMPAIRMENTS: Abnormal gait, decreased activity tolerance, decreased endurance, decreased mobility, difficulty walking, decreased ROM, decreased strength, hypomobility, increased fascial restrictions, impaired perceived functional ability, impaired flexibility, impaired vision/preception, improper body mechanics, postural dysfunction, and pain.   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   REHAB POTENTIAL: Fair    CLINICAL DECISION MAKING: Evolving/moderate complexity  EVALUATION COMPLEXITY: Moderate   GOALS: Goals reviewed with patient? Yes  SHORT TERM GOALS: Target date: 08/06/2023    Patient will be independent in home exercise program to improve strength/mobility for better functional independence with ADLs. Baseline:10/7 HEP given  Goal status: INITIAL    LONG TERM GOALS: Target date: 09/03/2023    Patient will increase FOTO score to equal to or greater than  67%   to demonstrate statistically significant improvement in  mobility and quality of life.  Baseline: 49 Goal status: INITIAL  2.  Patient will report a worst pain of 3/10 on VAS in  low back  to improve tolerance with ADLs and reduced symptoms with activities.  Baseline: 10/7: 8/10  Goal status: INITIAL  3. Patient will be modified independent in walking on even/uneven surface with least restrictive assistive device, for 20+ minutes without rest break, reporting some difficulty or less to improve walking tolerance with community ambulation including grocery shopping, going to church,etc.  Baseline: pain with walking  Goal status: INITIAL  4.  Patient (> 63 years old) will complete five times sit to stand test in < 15 seconds indicating an increased LE strength and improved balance. Baseline: 15.415 sec, slight increase in L hip pain  Goal status: INITIAL    PLAN:  PT FREQUENCY: 2x/week  PT DURATION: 8 weeks  PLANNED INTERVENTIONS: Therapeutic exercises, Therapeutic activity, Neuromuscular re-education, Balance training, Gait training, Patient/Family education, Self Care, Joint mobilization, Stair training, Vestibular training, Canalith repositioning, Visual/preceptual remediation/compensation, DME instructions, Dry Needling, Spinal mobilization, Cryotherapy, Moist heat, Splintting, Taping, Manual therapy, and Re-evaluation.  PLAN FOR NEXT SESSION:  pain reduction, piriformis stretching, core strengthening      Norman Herrlich, PT 09/12/2023, 2:06 PM

## 2023-09-17 ENCOUNTER — Ambulatory Visit: Payer: Medicare Other

## 2023-09-17 ENCOUNTER — Telehealth: Payer: Self-pay

## 2023-09-17 NOTE — Telephone Encounter (Signed)
Patient contacted and stated she was unable to come due to her husband not feeling well. She was informed of upcoming appointment on 12/18 at 2:00 pm. Pt reports her hip has been bothering her over the weekend and has an appointment tomorrow with her provider.    Randon Goldsmith, SPT

## 2023-09-18 ENCOUNTER — Telehealth: Payer: Self-pay | Admitting: Orthopedic Surgery

## 2023-09-18 DIAGNOSIS — M48062 Spinal stenosis, lumbar region with neurogenic claudication: Secondary | ICD-10-CM | POA: Diagnosis not present

## 2023-09-18 DIAGNOSIS — M5416 Radiculopathy, lumbar region: Secondary | ICD-10-CM | POA: Diagnosis not present

## 2023-09-18 NOTE — Telephone Encounter (Signed)
You can leave it up to her whether she wants to do a phone or in person visit.

## 2023-09-18 NOTE — Telephone Encounter (Signed)
Kristen Kirk placed anew referral for the patient to be seen here for lumbar radiculitis. She saw you back on 08/22/2023. She is scheduled for PT through 10/17/2023. She has a phone call with you on 10/12/23. Do you want to have the patient see you in person or keep the phone call as is?

## 2023-09-19 ENCOUNTER — Ambulatory Visit: Payer: Medicare Other

## 2023-09-19 DIAGNOSIS — M5459 Other low back pain: Secondary | ICD-10-CM

## 2023-09-19 DIAGNOSIS — R2689 Other abnormalities of gait and mobility: Secondary | ICD-10-CM | POA: Diagnosis not present

## 2023-09-19 DIAGNOSIS — R293 Abnormal posture: Secondary | ICD-10-CM | POA: Diagnosis not present

## 2023-09-19 DIAGNOSIS — R278 Other lack of coordination: Secondary | ICD-10-CM

## 2023-09-19 DIAGNOSIS — M6281 Muscle weakness (generalized): Secondary | ICD-10-CM | POA: Diagnosis not present

## 2023-09-19 NOTE — Therapy (Addendum)
OUTPATIENT PHYSICAL THERAPY THORACOLUMBAR TREATMENT  Patient Name: Kristen Kirk MRN: [id number] DOB:[birth year], 72 y.o., female Today's Date: 09/20/2023  END OF SESSION:  PT End of Session - 09/12/23 1404     Visit Number 8   Number of Visits 16    Date for PT Re-Evaluation 10/29/2023   PT Start Time 1401   PT Stop Time 1444   PT Time Calculation (min) 41 min    Equipment Utilized During Treatment Gait belt    Activity Tolerance Patient tolerated treatment well    Behavior During Therapy WFL for tasks assessed/performed                Past Medical History:  Diagnosis Date   Anemia in chronic kidney disease    a. s/p aranesp 11/2013 now starting epo with HD 06/2014   Diabetes mellitus type II    a. Currently diet controlled since losing weight.   DJD (degenerative joint disease)    Essential hypertension    GERD (gastroesophageal reflux disease)    Glaucoma, both eyes    a. L>R   History of echocardiogram    a. 01/2015 [facility]): EF ?55%, triv MR, mildly dil LA, nl PV.   History of end stage renal disease    previous on HD;   received kidney transplant 07/ 2018 (on dialysis since 2015 MWF)  followed by [facility] renal (Dr. Linus Salmons) considering transplant Access: LUE fistula Establishing with [facility]   History of stress test    a. 03/2013 [facility]): EF 63%, no ischemia.   History of syncope    a. 12/2015.   History of Urge Incontinence    HLD (hyperlipidemia)    Osteoarthritis    Osteoporosis 05/03/2019   DEXA 04/2019 - 1.5 spine, -2.1 hip, -2.5 forearm Check phosphate and PTH to guide further management in h/o kidney transplant   S/P kidney transplant 04/2017   @[facility] ---   per pt one kidney from living-donor   Vaginal atrophy    a. vagifem caused spotting, normal TVS   Vitamin D deficiency    Wears glasses    stated on 05/31/2022   Wears partial dentures    upper 2 teeth. 05/31/2022   Past Surgical History:  Procedure Laterality Date   ANAL  RECTAL MANOMETRY N/A 03/16/2021   Procedure: ANO RECTAL MANOMETRY;  Surgeon: Napoleon Form, MD;  Location: [facility];  Service: Endoscopy;  Laterality: N/A;   BLADDER SUSPENSION  2011   for stress incontinence-Dr. MacDiarmid   BREAST BIOPSY Right 12/15/2020   fat necrosis   CATARACT EXTRACTION W/ INTRAOCULAR LENS IMPLANT Bilateral    left 02-25-2021;  right ?   COLONOSCOPY  03/2019   diverticulosis, rpt 10 yrs (Nandigam)   KIDNEY TRANSPLANT Right 04/03/2017   [facility])   left eye laser surgery  06/2011   LIGATION OF ARTERIOVENOUS  FISTULA  10/2017   thrombosed L arm AFV   NEPHRECTOMY TRANSPLANTED ORGAN     REFRACTIVE SURGERY     TUBAL LIGATION Bilateral    1990s   Patient Active Problem List   Diagnosis Date Noted   Secondary corneal edema of left eye 08/02/2023   Cystoid macular degeneration, 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   Anemia in chronic kidney disease    Essential hypertension    UTI (urinary tract infection)    Type II diabetes mellitus with renal manifestations (HCC)    Immunosuppressed status (HCC) 04/15/2022   Health maintenance examination 04/14/2022   Constipation due to  outlet dysfunction    Acquired leg length discrepancy 01/28/2021   ASCUS of cervix with negative high risk HPV 10/01/2020   Thoracic spine pain 01/05/2020   Chronic lower back pain 10/08/2019   Osteoporosis 05/03/2019   Advanced care planning/counseling discussion 03/20/2019   MCI (mild cognitive impairment) with memory loss 03/20/2019   History of renal transplant 04/01/2017   Incontinence of feces 01/11/2017    Class: Acute   Insomnia 01/14/2013   Xerostomia 01/29/2012   Medicare annual wellness visit, subsequent 07/20/2011   Vaginal atrophy 04/17/2011   Type 2 diabetes mellitus with other specified complication (HCC)    Hyperlipidemia associated with type 2 diabetes mellitus (HCC)    GERD (gastroesophageal reflux disease)    Mixed incontinence urge and stress    Vitamin D deficiency  05/19/2009   MUSCLE CRAMPS 02/22/2009   Alopecia 10/05/2008   Vertigo 07/31/2008   Osteoarthritis 01/03/2007   Primary open angle glaucoma (POAG) of both eyes, severe stage 10/03/1999    PCP: Eustaquio Boyden MD  REFERRING PROVIDER: Liana Gerold MD   REFERRING DIAG: lumbar radiculopathy, lumbar spondylosis, spondylolisthesis of lumbar spine, spinal stenosis of lumbar region  Rationale for Evaluation and Treatment: Rehabilitation  THERAPY DIAG:  Other low back pain  Muscle weakness (generalized)  Other abnormalities of gait and mobility  Other lack of coordination  Abnormal posture  ONSET DATE: 1-2 weeks ago new pain began.   SUBJECTIVE:                                                                                                                                                                                           SUBJECTIVE STATEMENT: Patient reports she is doing okay, but her hip has been bothering her. Over the weekend, she had an increase in pain where her hip started feeling pretty bad. She had a visit over at [facility] clinic where she was provided some oxycodone to relieve pain. She reports she has not been as compliant with her HEP due to pain levels . She is currently at 10/10 pain.    PERTINENT HISTORY:  Patient presents to PT for evaluation of lumbar radiculopathy, lumbar spondylosis, spondylolisthesis of lumbar spine, spinal stenosis of lumbar region. Pmh includes renal transplant (2018), HTN, HLD, glaucoma, CKD, DM, osteoporosis, anemia, DM type II, DJD, GERD, vaginal atrophy, . She has a sacral stimulator. Per physician note she has one month of constant lateral left leg pain to her knee with pain worsening in the morning and with walking. In 2021 had L L5-S1 TF ESI. Patient reports pain is primarily in L hip, reports she was given a shot. Reports pain  goes to back of her knee.   PAIN:  Are you having pain? yes This AM 9/10, 8/10 now  Worst Pain: 8/10   Least pain: 3-4/10   PRECAUTIONS: None  RED FLAGS: Bowel or bladder incontinence: Yes: fecal incontinence     WEIGHT BEARING RESTRICTIONS: No  FALLS:  Has patient fallen in last 6 months? No  LIVING ENVIRONMENT: Lives with: lives with their spouse Lives in: House/apartment Stairs:  basement downstairs,  two stories, has a chair lift  Has following equipment at home:  chair lift   OCCUPATION: retired   PLOF: Independent  PATIENT GOALS: to reduce pain     OBJECTIVE:  Note: Objective measures were completed at Evaluation unless otherwise noted.  DIAGNOSTIC FINDINGS:  EXAM: CT LUMBAR SPINE WITHOUT CONTRAST   TECHNIQUE: Multidetector CT imaging of the lumbar spine was performed without intravenous contrast administration. Multiplanar CT image reconstructions were also generated.   RADIATION DOSE REDUCTION: This exam was performed according to the departmental dose-optimization program which includes automated exposure control, adjustment of the mA and/or kV according to patient size and/or use of iterative reconstruction technique.   COMPARISON:  01/13/2020   FINDINGS: Segmentation: 5 lumbar type vertebrae.   Alignment: Grade 1 anterolisthesis of L4 on L5 secondary to facet disease. 2 mm retrolisthesis of L2 on L3 and L1 on L2.   Vertebrae: No acute fracture or aggressive osseous lesion.   Paraspinal and other soft tissues: Abdominal aortic atherosclerosis. Bilateral renal atrophy.   Other: None   Disc levels:   Disc spaces: Degenerative disease with disc height loss at T11-12, L1-2, L2-3, L4-5 and L5-S1.   T12-L1: Mild broad-based disc bulge. Mild bilateral facet arthropathy. No foraminal or central canal stenosis.   L1-L2: Broad-based disc bulge. Moderate bilateral facet arthropathy. Mild right foraminal stenosis. No left foraminal stenosis. No spinal stenosis.   L2-L3: Broad-based disc bulge. Mild bilateral facet arthropathy. Moderate spinal  stenosis. Bilateral subarticular recess stenosis. Moderate bilateral foraminal stenosis.   L3-L4: Broad-based disc bulge. Moderate bilateral facet arthropathy. Severe spinal stenosis. Severe bilateral foraminal stenosis.   L4-L5: Broad-based disc bulge. Severe bilateral facet arthropathy. Mild bilateral foraminal stenosis. Severe spinal stenosis.   L5-S1: Broad-based disc bulge. Severe left and mild right facet arthropathy. Severe bilateral foraminal stenosis.   IMPRESSION: 1. Lumbar spine spondylosis as described above. 2. No acute osseous injury of the lumbar spine. 3.  Aortic Atherosclerosis (ICD10-I70.0).    PATIENT SURVEYS:  FOTO 49  SCREENING FOR RED FLAGS: Bowel or bladder incontinence: Yes:     COGNITION: Overall cognitive status: Within functional limits for tasks assessed     SENSATION: Decreased R adductor region   MUSCLE LENGTH:  Thomas test limited bilaterally   POSTURE: anterior pelvic tilt  PALPATION: Significant tension of L piriformis and quadratus lumborum   LUMBAR ROM:   AROM eval  Flexion 54  Extension 6  Right lateral flexion Limited 50%  Left lateral flexion Limited 50 %  Right rotation Limited 50%  Left rotation Limited 50%    (Blank rows = not tested)    LOWER EXTREMITY MMT:    MMT Right eval Left eval  Hip flexion 4+ 4+  Hip extension    Hip abduction 4+ 4+  Hip adduction 4+ 4+  Hip internal rotation    Hip external rotation    Knee flexion 5 5  Knee extension 4+ 4+  Ankle dorsiflexion 5 5  Ankle plantarflexion 4+ 4+  Ankle inversion    Ankle eversion     (  Blank rows = not tested)  LUMBAR SPECIAL TESTS:  Straight leg raise test: Positive, SI Compression/distraction test: Positive, and FABER test: Negative  FUNCTIONAL TESTS:  5 times sit to stand: 15.415 10 meter walk test: 10.94sec 0.982m/s     GAIT: Distance walked: 30 ft Assistive device utilized: None Level of assistance: Complete Independence Comments:  antalgic gait pattern   TODAY'S TREATMENT:                                                                                                                              DATE: 09/20/23 Goals reassessed in today's visit.  Left hip pain rated as 10/10 pain   FOTO: 33 5XSTS: deferred due to pain levels   Therex:  -Supine hamstring stretch x60s BLE -Supine piriformis stretch x60s BLE -Supine hamstring stretch x60s BLE  -Standing swiss ball marches +abdominal bracing on mat table 3x20 -Posterior pelvic tilts 2x10  -Supine hip abduction/adduction 2x10 BLE -Attempted glute bridges and straight leg raise all caused pain with left hip and deferred further reps   -Seated pirofrmis stretch x45s BLE  PATIENT EDUCATION:  Education details: goals, POC, HEP. Pt educated throughout session about proper posture and technique with exercises. Improved exercise technique, movement at target joints, use of target muscles after min to mod verbal, visual, tactile cues.  Person educated: Patient Education method: Explanation, Demonstration, Tactile cues, and Verbal cues Education comprehension: verbalized understanding, returned demonstration, verbal cues required, and tactile cues required  HOME EXERCISE PROGRAM:  Access Code: [email or web address] URL: [email or web address] Date: 08/15/2023 Prepared by: Maureen Ralphs  Exercises - Supine Lower Trunk Rotation  - 1-2 x daily - 3 sets - 30 hold - Hooklying Single Knee to Chest Stretch  - 1 x daily - 3 sets - 30 hold - Seated Piriformis Stretch with Trunk Bend  - 1-2 x daily - 3 sets - 30 hold - Seated Hamstring Stretch  - 1-2 x daily - 3 sets - 30 hold    Access Code: [facility] URL: [email or web address] Prepared by: Precious Bard  Exercises - Supine Lower Trunk Rotation  - 7 x weekly - 1 x daily - 2 sets - 10 reps - 5 hold - Supine Posterior Pelvic Tilt  - 7 x weekly - 1 x daily - 2 sets - 10 reps - 5 hold - Supine Piriformis  Stretch with Foot on Ground  - 1 x daily - 7 x weekly - 2 sets - 1 reps - 30 hold     ASSESSMENT:  CLINICAL IMPRESSION:  Pt reports continued pain in her low back. Goal assessment performed today for recert. Due to patient's high reports of pain this session her FOTO score decreased and worse pain report increased. Deferred testing of STS until pain is better managed. She still reports difficulty with walking for greater than 20 minutes. Will continue to work on pain relief techniques with core strengthening as pt can tolerate. Patient will continue to benefit from skilled  physical therapy to reduce pain and improve mobility to return to PLOF.   OBJECTIVE IMPAIRMENTS: Abnormal gait, decreased activity tolerance, decreased endurance, decreased mobility, difficulty walking, decreased ROM, decreased strength, hypomobility, increased fascial restrictions, impaired perceived functional ability, impaired flexibility, impaired vision/preception, improper body mechanics, postural dysfunction, and pain.   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   REHAB POTENTIAL: Fair    CLINICAL DECISION MAKING: Evolving/moderate complexity  EVALUATION COMPLEXITY: Moderate   GOALS: Goals reviewed with patient? Yes  SHORT TERM GOALS: Target date: 08/06/2023    Patient will be independent in home exercise program to improve strength/mobility for better  functional independence with ADLs. Baseline:10/7 HEP given 12/18: pt reports not as compliant due to increase in pain  Goal status: IN PROGRESS    LONG TERM GOALS: Target date: 10/29/2023    Patient will increase FOTO score to equal to or greater than  67%   to demonstrate statistically significant improvement in mobility and quality of life.  Baseline: 49 12/18: 33 Goal status: IN PROGRESS  2.  Patient will report a worst pain of 3/10 on VAS in  low back  to improve tolerance with ADLs and reduced symptoms with activities.  Baseline: 10/7: 8/10 12/18:10/10 Goal status: IN PROGRESS  3. Patient will be modified independent in walking on even/uneven surface with least restrictive assistive device, for 20+ minutes without rest break, reporting some difficulty or less to improve walking tolerance with community ambulation including grocery shopping, going to church,etc.  Baseline: pain with walking 12/18: pt still reports difficulty and pain with prolonged distances  Goal status: IN PROGRESS  4.  Patient (> 31 years old) will complete five times sit to stand test in < 15 seconds indicating an increased LE strength and improved balance. Baseline: 15.415 sec, slight increase in L hip pain 12/18: deferred this visit due to 10/10 hip pain  Goal status: IN PROGRESS    PLAN:  PT FREQUENCY: 2x/week  PT DURATION: 8 weeks  PLANNED INTERVENTIONS: Therapeutic exercises, Therapeutic activity, Neuromuscular re-education, Balance training, Gait training, Patient/Family education, Self Care, Joint mobilization, Stair training, Vestibular training, Canalith repositioning, Visual/preceptual remediation/compensation, DME instructions, Dry Needling, Spinal mobilization, Cryotherapy, Moist heat, Splintting, Taping, Manual therapy, and Re-evaluation.  PLAN FOR NEXT SESSION:  pain reduction, piriformis stretching, core strengthening     Randon Goldsmith, Student-PT  This entire session was performed  under direct supervision and direction of a licensed therapist/therapist assistant . I have personally read, edited and approve of the note as written.  Precious Bard, PT 09/20/2023, 9:25 AM

## 2023-09-20 NOTE — Telephone Encounter (Signed)
She decided to come in person.

## 2023-09-20 NOTE — Therapy (Signed)
OUTPATIENT PHYSICAL THERAPY THORACOLUMBAR TREATMENT  Patient Name: Kristen Kirk MRN: [id number] DOB:Jan 18, 1951, 72 y.o., female Today's Date: 09/24/2023  END OF SESSION:  PT End of Session - 09/12/23 1404     Visit Number 9   Number of Visits 16    Date for PT Re-Evaluation 10/29/2023   PT Start Time 1155   PT Stop Time 1229   PT Time Calculation (min) 41 min    Equipment Utilized During Treatment Gait belt    Activity Tolerance Patient tolerated treatment well    Behavior During Therapy WFL for tasks assessed/performed                Past Medical History:  Diagnosis Date   Anemia in chronic kidney disease    a. s/p aranesp 11/2013 now starting epo with HD 06/2014   Diabetes mellitus type II    a. Currently diet controlled since losing weight.   DJD (degenerative joint disease)    Essential hypertension    GERD (gastroesophageal reflux disease)    Glaucoma, both eyes    a. L>R   History of echocardiogram    a. 01/2015 [facility]): EF ?55%, triv MR, mildly dil LA, nl PV.   History of end stage renal disease    previous on HD;   received kidney transplant 07/ 2018 (on dialysis since 2015 MWF)  followed by [facility] (Dr. Linus Salmons) considering transplant Access: LUE fistula Establishing with [facility]   History of stress test    a. 03/2013 My[facility]): EF 63%, no ischemia.   History of syncope    a. 12/2015.   History of Urge Incontinence    HLD (hyperlipidemia)    Osteoarthritis    Osteoporosis 05/03/2019   DEXA 04/2019 - 1.5 spine, -2.1 hip, -2.5 forearm Check phosphate and PTH to guide further management in h/o kidney transplant   S/P kidney transplant 04/2017   @[facility] ---   per pt one kidney from living-donor   Vaginal atrophy    a. vagifem caused spotting, normal TVS   Vitamin D deficiency    Wears glasses    stated on 05/31/2022   Wears partial dentures    upper 2 teeth. 05/31/2022   Past Surgical History:  Procedure Laterality Date   ANAL  RECTAL MANOMETRY N/A 03/16/2021   Procedure: ANO RECTAL MANOMETRY;  Surgeon: Napoleon Form, MD;  Location: [facility];  Service: Endoscopy;  Laterality: N/A;   BLADDER SUSPENSION  2011   for stress incontinence-Dr. MacDiarmid   BREAST BIOPSY Right 12/15/2020   fat necrosis   CATARACT EXTRACTION W/ INTRAOCULAR LENS IMPLANT Bilateral    left 02-25-2021;  right ?   COLONOSCOPY  03/2019   diverticulosis, rpt 10 yrs (Nandigam)   KIDNEY TRANSPLANT Right 04/03/2017   [facility])   left eye laser surgery  06/2011   LIGATION OF ARTERIOVENOUS  FISTULA  10/2017   thrombosed L arm AFV   NEPHRECTOMY TRANSPLANTED ORGAN     REFRACTIVE SURGERY     TUBAL LIGATION Bilateral    1990s   Patient Active Problem List   Diagnosis Date Noted   Secondary corneal edema of left eye 08/02/2023   Cystoid macular degeneration, 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   Anemia in chronic kidney disease    Essential hypertension    UTI (urinary tract infection)    Type II diabetes mellitus with renal manifestations (HCC)    Immunosuppressed status (HCC) 04/15/2022   Health maintenance examination 04/14/2022   Constipation due to  outlet dysfunction    Acquired leg length discrepancy 01/28/2021   ASCUS of cervix with negative high risk HPV 10/01/2020   Thoracic spine pain 01/05/2020   Chronic lower back pain 10/08/2019   Osteoporosis 05/03/2019   Advanced care planning/counseling discussion 03/20/2019   MCI (mild cognitive impairment) with memory loss 03/20/2019   History of renal transplant 04/01/2017   Incontinence of feces 01/11/2017    Class: Acute   Insomnia 01/14/2013   Xerostomia 01/29/2012   Medicare annual wellness visit, subsequent 07/20/2011   Vaginal atrophy 04/17/2011   Type 2 diabetes mellitus with other specified complication (HCC)    Hyperlipidemia associated with type 2 diabetes mellitus (HCC)    GERD (gastroesophageal reflux disease)    Mixed incontinence urge and stress    Vitamin D deficiency  05/19/2009   MUSCLE CRAMPS 02/22/2009   Alopecia 10/05/2008   Vertigo 07/31/2008   Osteoarthritis 01/03/2007   Primary open angle glaucoma (POAG) of both eyes, severe stage 10/03/1999    PCP: Eustaquio Boyden MD  REFERRING PROVIDER: Liana Gerold MD   REFERRING DIAG: lumbar radiculopathy, lumbar spondylosis, spondylolisthesis of lumbar spine, spinal stenosis of lumbar region  Rationale for Evaluation and Treatment: Rehabilitation  THERAPY DIAG:  Other low back pain  Muscle weakness (generalized)  Other abnormalities of gait and mobility  ONSET DATE: 1-2 weeks ago new pain began.   SUBJECTIVE:                                                                                                                                                                                           SUBJECTIVE STATEMENT: Patient arrived late limiting session duration.     PERTINENT HISTORY:  Patient presents to PT for evaluation of lumbar radiculopathy, lumbar spondylosis, spondylolisthesis of lumbar spine, spinal stenosis of lumbar region. Pmh includes renal transplant (2018), HTN, HLD, glaucoma, CKD, DM, osteoporosis, anemia, DM type II, DJD, GERD, vaginal atrophy, . She has a sacral stimulator. Per physician note she has one month of constant lateral left leg pain to her knee with pain worsening in the morning and with walking. In 2021 had L L5-S1 TF ESI. Patient reports pain is primarily in L hip, reports she was given a shot. Reports pain goes to back of her knee.   PAIN:  Are you having pain? yes This AM 9/10, 8/10 now  Worst Pain: 8/10  Least pain: 3-4/10   PRECAUTIONS: None  RED FLAGS: Bowel or bladder incontinence: Yes: fecal incontinence     WEIGHT BEARING RESTRICTIONS: No  FALLS:  Has patient fallen in last 6 months? No  LIVING ENVIRONMENT: Lives with: lives with their  spouse Lives in: House/apartment Stairs:  basement downstairs,  two stories, has a chair lift  Has  following equipment at home:  chair lift   OCCUPATION: retired   PLOF: Independent  PATIENT GOALS: to reduce pain     OBJECTIVE:  Note: Objective measures were completed at Evaluation unless otherwise noted.  DIAGNOSTIC FINDINGS:  EXAM: CT LUMBAR SPINE WITHOUT CONTRAST   TECHNIQUE: Multidetector CT imaging of the lumbar spine was performed without intravenous contrast administration. Multiplanar CT image reconstructions were also generated.   RADIATION DOSE REDUCTION: This exam was performed according to the departmental dose-optimization program which includes automated exposure control, adjustment of the mA and/or kV according to patient size and/or use of iterative reconstruction technique.   COMPARISON:  01/13/2020   FINDINGS: Segmentation: 5 lumbar type vertebrae.   Alignment: Grade 1 anterolisthesis of L4 on L5 secondary to facet disease. 2 mm retrolisthesis of L2 on L3 and L1 on L2.   Vertebrae: No acute fracture or aggressive osseous lesion.   Paraspinal and other soft tissues: Abdominal aortic atherosclerosis. Bilateral renal atrophy.   Other: None   Disc levels:   Disc spaces: Degenerative disease with disc height loss at T11-12, L1-2, L2-3, L4-5 and L5-S1.   T12-L1: Mild broad-based disc bulge. Mild bilateral facet arthropathy. No foraminal or central canal stenosis.   L1-L2: Broad-based disc bulge. Moderate bilateral facet arthropathy. Mild right foraminal stenosis. No left foraminal stenosis. No spinal stenosis.   L2-L3: Broad-based disc bulge. Mild bilateral facet arthropathy. Moderate spinal stenosis. Bilateral subarticular recess stenosis. Moderate bilateral foraminal stenosis.   L3-L4: Broad-based disc bulge. Moderate bilateral facet arthropathy. Severe spinal stenosis. Severe bilateral foraminal stenosis.   L4-L5: Broad-based disc bulge. Severe bilateral facet arthropathy. Mild bilateral foraminal stenosis. Severe spinal stenosis.    L5-S1: Broad-based disc bulge. Severe left and mild right facet arthropathy. Severe bilateral foraminal stenosis.   IMPRESSION: 1. Lumbar spine spondylosis as described above. 2. No acute osseous injury of the lumbar spine. 3.  Aortic Atherosclerosis (ICD10-I70.0).    PATIENT SURVEYS:  FOTO 49  SCREENING FOR RED FLAGS: Bowel or bladder incontinence: Yes:     COGNITION: Overall cognitive status: Within functional limits for tasks assessed     SENSATION: Decreased R adductor region   MUSCLE LENGTH:  Thomas test limited bilaterally   POSTURE: anterior pelvic tilt  PALPATION: Significant tension of L piriformis and quadratus lumborum   LUMBAR ROM:   AROM eval  Flexion 54  Extension 6  Right lateral flexion Limited 50%  Left lateral flexion Limited 50 %  Right rotation Limited 50%  Left rotation Limited 50%    (Blank rows = not tested)    LOWER EXTREMITY MMT:    MMT Right eval Left eval  Hip flexion 4+ 4+  Hip extension    Hip abduction 4+ 4+  Hip adduction 4+ 4+  Hip internal rotation    Hip external rotation    Knee flexion 5 5  Knee extension 4+ 4+  Ankle dorsiflexion 5 5  Ankle plantarflexion 4+ 4+  Ankle inversion    Ankle eversion     (Blank rows = not tested)  LUMBAR SPECIAL TESTS:  Straight leg raise test: Positive, SI Compression/distraction test: Positive, and FABER test: Negative  FUNCTIONAL TESTS:  5 times sit to stand: 15.415 10 meter walk test: 10.94sec 0.970m/s     GAIT: Distance walked: 30 ft Assistive device utilized: None Level of assistance: Complete Independence Comments: antalgic gait pattern   TODAY'S TREATMENT:  DATE: 09/24/23   Therex:  -Supine hamstring stretch x60s BLE -Supine piriformis stretch x60s BLE -Supine hamstring stretch x60s BLE  -supine swiss ball marches +abdominal bracing on  mat table 10x -Posterior pelvic tilts 2x10  -sciatic nerve glide 60 seconds each LE -hamstring curl swiss ball 10x  -Supine hip abduction/adduction RTB 15x  -Seated pirofrmis stretch x45s BLE -seated hamstring stretch 60 seconds each LE  PATIENT EDUCATION:  Education details: goals, POC, HEP. Pt educated throughout session about proper posture and technique with exercises. Improved exercise technique, movement at target joints, use of target muscles after min to mod verbal, visual, tactile cues.  Person educated: Patient Education method: Explanation, Demonstration, Tactile cues, and Verbal cues Education comprehension: verbalized understanding, returned demonstration, verbal cues required, and tactile cues required  HOME EXERCISE PROGRAM:  Access Code: [email or web address] URL: [email or web address] Date: 08/15/2023 Prepared by: Maureen Ralphs  Exercises - Supine Lower Trunk Rotation  - 1-2 x daily - 3 sets - 30 hold - Hooklying Single Knee to Chest Stretch  - 1 x daily - 3 sets - 30 hold - Seated Piriformis Stretch with Trunk Bend  - 1-2 x daily - 3 sets - 30 hold - Seated Hamstring Stretch  - 1-2 x daily - 3 sets - 30 hold    Access Code: [facility] URL: [email or web address] Prepared by: Precious Bard  Exercises - Supine Lower Trunk Rotation  - 7 x weekly - 1 x daily - 2 sets - 10 reps - 5 hold - Supine Posterior Pelvic Tilt  - 7 x weekly - 1 x daily - 2 sets - 10 reps - 5 hold - Supine Piriformis Stretch with Foot on Ground  - 1 x daily - 7 x weekly - 2 sets - 1 reps - 30 hold     ASSESSMENT:  CLINICAL IMPRESSION:   Patient arrived late limiting session duration. Seated hamstring stretch added to HEP to decrease pain with prolonged standing. Patient educated on and performed core stabilization.  Patient will continue to benefit from skilled physical therapy to reduce pain and improve mobility to return to PLOF.   OBJECTIVE IMPAIRMENTS: Abnormal  gait, decreased activity tolerance, decreased endurance, decreased mobility, difficulty walking, decreased ROM, decreased strength, hypomobility, increased fascial restrictions, impaired perceived functional ability, impaired flexibility, impaired vision/preception, improper body mechanics, postural dysfunction, and pain.   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   REHAB POTENTIAL: Fair    CLINICAL DECISION MAKING: Evolving/moderate complexity  EVALUATION COMPLEXITY: Moderate   GOALS: Goals reviewed with patient? Yes  SHORT TERM GOALS: Target date: 08/06/2023    Patient will be independent in home exercise program to improve strength/mobility for better functional independence with ADLs. Baseline:10/7 HEP given 12/18: pt reports not as compliant due to increase in pain  Goal status: IN PROGRESS    LONG TERM GOALS: Target date: 10/29/2023    Patient will increase FOTO score to equal to or greater than  67%   to demonstrate statistically significant improvement in mobility and quality of life.  Baseline: 49 12/18: 33 Goal status: IN PROGRESS  2.  Patient will report  a worst pain of 3/10 on VAS in  low back  to improve tolerance with ADLs and reduced symptoms with activities.  Baseline: 10/7: 8/10 12/18:10/10 Goal status: IN PROGRESS  3. Patient will be modified independent in walking on even/uneven surface with least restrictive  assistive device, for 20+ minutes without rest break, reporting some difficulty or less to improve walking tolerance with community ambulation including grocery shopping, going to church,etc.  Baseline: pain with walking 12/18: pt still reports difficulty and pain with prolonged distances  Goal status: IN PROGRESS  4.  Patient (> 44 years old) will complete five times sit to stand test in < 15 seconds indicating an increased LE strength and improved balance. Baseline: 15.415 sec, slight increase in L hip pain 12/18: deferred this visit due to 10/10 hip pain  Goal status: IN PROGRESS    PLAN:  PT FREQUENCY: 2x/week  PT DURATION: 8 weeks  PLANNED INTERVENTIONS: Therapeutic exercises, Therapeutic activity, Neuromuscular re-education, Balance training, Gait training, Patient/Family education, Self Care, Joint mobilization, Stair training, Vestibular training, Canalith repositioning, Visual/preceptual remediation/compensation, DME instructions, Dry Needling, Spinal mobilization, Cryotherapy, Moist heat, Splintting, Taping, Manual therapy, and Re-evaluation.  PLAN FOR NEXT SESSION:  pain reduction, piriformis stretching, core strengthening      Precious Bard, PT 09/24/2023, 2:01 PM

## 2023-09-24 ENCOUNTER — Ambulatory Visit: Payer: Medicare Other

## 2023-09-24 DIAGNOSIS — R278 Other lack of coordination: Secondary | ICD-10-CM | POA: Diagnosis not present

## 2023-09-24 DIAGNOSIS — M6281 Muscle weakness (generalized): Secondary | ICD-10-CM

## 2023-09-24 DIAGNOSIS — R2689 Other abnormalities of gait and mobility: Secondary | ICD-10-CM | POA: Diagnosis not present

## 2023-09-24 DIAGNOSIS — M5459 Other low back pain: Secondary | ICD-10-CM

## 2023-09-24 DIAGNOSIS — R293 Abnormal posture: Secondary | ICD-10-CM | POA: Diagnosis not present

## 2023-09-27 ENCOUNTER — Ambulatory Visit: Payer: Medicare Other

## 2023-09-27 DIAGNOSIS — M6281 Muscle weakness (generalized): Secondary | ICD-10-CM | POA: Diagnosis not present

## 2023-09-27 DIAGNOSIS — R293 Abnormal posture: Secondary | ICD-10-CM | POA: Diagnosis not present

## 2023-09-27 DIAGNOSIS — R278 Other lack of coordination: Secondary | ICD-10-CM

## 2023-09-27 DIAGNOSIS — M5459 Other low back pain: Secondary | ICD-10-CM | POA: Diagnosis not present

## 2023-09-27 DIAGNOSIS — R2689 Other abnormalities of gait and mobility: Secondary | ICD-10-CM

## 2023-09-27 NOTE — Therapy (Signed)
OUTPATIENT PHYSICAL THERAPY THORACOLUMBAR TREATMENT/PHYSICAL THERAPY PROGRESS NOTE   Dates of reporting period  07/09/23   to   09/27/23   Patient Name: Kristen Kirk MRN: [id number] DOB:August 25, 1951, 72 y.o., female Today's Date: 09/27/2023  END OF SESSION:  PT End of Session - 09/27/23 1146     Visit Number 10    Number of Visits 16    Date for PT Re-Evaluation 10/29/23    Authorization Type IE 05/05/2021    PT Start Time 1100    PT Stop Time 1145    PT Time Calculation (min) 45 min    Equipment Utilized During Treatment Gait belt    Activity Tolerance Patient tolerated treatment well;Patient limited by pain    Behavior During Therapy [facility] for tasks assessed/performed              Past Medical History:  Diagnosis Date   Anemia in chronic kidney disease    a. s/p aranesp 11/2013 now starting epo with HD 06/2014   Diabetes mellitus type II    a. Currently diet controlled since losing weight.   DJD (degenerative joint disease)    Essential hypertension    GERD (gastroesophageal reflux disease)    Glaucoma, both eyes    a. L>R   History of echocardiogram    a. 01/2015 [facility]): EF ?55%, triv MR, mildly dil LA, nl PV.   History of end stage renal disease    previous on HD;   received kidney transplant 07/ 2018 (on dialysis since 2015 MWF)  followed by [facility] renal (Dr. Linus Salmons) considering transplant Access: LUE fistula Establishing with [facility]   History of stress test    a. 03/2013 [facility]): EF 63%, no ischemia.   History of syncope    a. 12/2015.   History of Urge Incontinence    HLD (hyperlipidemia)    Osteoarthritis    Osteoporosis 05/03/2019   DEXA 04/2019 - 1.5 spine, -2.1 hip, -2.5 forearm Check phosphate and PTH to guide further management in h/o kidney transplant   S/P kidney transplant 04/2017   @[facility] ---   per pt one kidney from living-donor   Vaginal atrophy    a. vagifem caused spotting, normal TVS   Vitamin D deficiency    Wears  glasses    stated on 05/31/2022   Wears partial dentures    upper 2 teeth. 05/31/2022   Past Surgical History:  Procedure Laterality Date   ANAL RECTAL MANOMETRY N/A 03/16/2021   Procedure: ANO RECTAL MANOMETRY;  Surgeon: Napoleon Form, MD;  Location: [facility] ENDOSCOPY;  Service: Endoscopy;  Laterality: N/A;   BLADDER SUSPENSION  2011   for stress incontinence-Dr. MacDiarmid   BREAST BIOPSY Right 12/15/2020   fat necrosis   CATARACT EXTRACTION W/ INTRAOCULAR LENS IMPLANT Bilateral    left 02-25-2021;  right ?   COLONOSCOPY  03/2019   diverticulosis, rpt 10 yrs (Nandigam)   KIDNEY TRANSPLANT Right 04/03/2017   [facility])   left eye laser surgery  06/2011   LIGATION OF ARTERIOVENOUS  FISTULA  10/2017   thrombosed L arm AFV   NEPHRECTOMY TRANSPLANTED ORGAN     REFRACTIVE SURGERY     TUBAL LIGATION Bilateral    1990s   Patient Active Problem List   Diagnosis Date Noted   Secondary corneal edema of left eye 08/02/2023   Cystoid macular degeneration, 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   Anemia in chronic kidney disease    Essential hypertension    UTI (urinary tract  infection)    Type II diabetes mellitus with renal manifestations (HCC)    Immunosuppressed status (HCC) 04/15/2022   Health maintenance examination 04/14/2022   Constipation due to outlet dysfunction    Acquired leg length discrepancy 01/28/2021   ASCUS of cervix with negative high risk HPV 10/01/2020   Thoracic spine pain 01/05/2020   Chronic lower back pain 10/08/2019   Osteoporosis 05/03/2019   Advanced care planning/counseling discussion 03/20/2019   MCI (mild cognitive impairment) with memory loss 03/20/2019   History of renal transplant 04/01/2017   Incontinence of feces 01/11/2017    Class: Acute   Insomnia 01/14/2013   Xerostomia 01/29/2012   Medicare annual wellness visit, subsequent 07/20/2011   Vaginal atrophy 04/17/2011   Type 2 diabetes mellitus with other specified complication (HCC)    Hyperlipidemia  associated with type 2 diabetes mellitus (HCC)    GERD (gastroesophageal reflux disease)    Mixed incontinence urge and stress    Vitamin D deficiency 05/19/2009   MUSCLE CRAMPS 02/22/2009   Alopecia 10/05/2008   Vertigo 07/31/2008   Osteoarthritis 01/03/2007   Primary open angle glaucoma (POAG) of both eyes, severe stage 10/03/1999    PCP: Eustaquio Boyden MD  REFERRING PROVIDER: Liana Gerold MD   REFERRING DIAG: lumbar radiculopathy, lumbar spondylosis, spondylolisthesis of lumbar spine, spinal stenosis of lumbar region  Rationale for Evaluation and Treatment: Rehabilitation  THERAPY DIAG:  Other low back pain  Muscle weakness (generalized)  Other abnormalities of gait and mobility  Other lack of coordination  Abnormal posture  ONSET DATE: 1-2 weeks ago new pain began.   SUBJECTIVE:                                                                                                                                                                                           SUBJECTIVE STATEMENT:  Pt reports she is having 8/10 pain upon arrival.  Pt notes that the long walk make it increase overall pain in the lumbar spine.    PERTINENT HISTORY:  Patient presents to PT for evaluation of lumbar radiculopathy, lumbar spondylosis, spondylolisthesis of lumbar spine, spinal stenosis of lumbar region. Pmh includes renal transplant (2018), HTN, HLD, glaucoma, CKD, DM, osteoporosis, anemia, DM type II, DJD, GERD, vaginal atrophy, . She has a sacral stimulator. Per physician note she has one month of constant lateral left leg pain to her knee with pain worsening in the morning and with walking. In 2021 had L L5-S1 TF ESI. Patient reports pain is primarily in L hip, reports she was given a shot. Reports pain goes to back of her knee.   PAIN:  Are you  having pain? yes This AM 9/10, 8/10 now  Worst Pain: 8/10  Least pain: 3-4/10   PRECAUTIONS: None  RED FLAGS: Bowel or bladder  incontinence: Yes: fecal incontinence     WEIGHT BEARING RESTRICTIONS: No  FALLS:  Has patient fallen in last 6 months? No  LIVING ENVIRONMENT: Lives with: lives with their spouse Lives in: House/apartment Stairs:  basement downstairs,  two stories, has a chair lift  Has following equipment at home:  chair lift   OCCUPATION: retired   PLOF: Independent  PATIENT GOALS: to reduce pain     OBJECTIVE:  Note: Objective measures were completed at Evaluation unless otherwise noted.  DIAGNOSTIC FINDINGS:  EXAM: CT LUMBAR SPINE WITHOUT CONTRAST   TECHNIQUE: Multidetector CT imaging of the lumbar spine was performed without intravenous contrast administration. Multiplanar CT image reconstructions were also generated.   RADIATION DOSE REDUCTION: This exam was performed according to the departmental dose-optimization program which includes automated exposure control, adjustment of the mA and/or kV according to patient size and/or use of iterative reconstruction technique.   COMPARISON:  01/13/2020   FINDINGS: Segmentation: 5 lumbar type vertebrae.   Alignment: Grade 1 anterolisthesis of L4 on L5 secondary to facet disease. 2 mm retrolisthesis of L2 on L3 and L1 on L2.   Vertebrae: No acute fracture or aggressive osseous lesion.   Paraspinal and other soft tissues: Abdominal aortic atherosclerosis. Bilateral renal atrophy.   Other: None   Disc levels:   Disc spaces: Degenerative disease with disc height loss at T11-12, L1-2, L2-3, L4-5 and L5-S1.   T12-L1: Mild broad-based disc bulge. Mild bilateral facet arthropathy. No foraminal or central canal stenosis.   L1-L2: Broad-based disc bulge. Moderate bilateral facet arthropathy. Mild right foraminal stenosis. No left foraminal stenosis. No spinal stenosis.   L2-L3: Broad-based disc bulge. Mild bilateral facet arthropathy. Moderate spinal stenosis. Bilateral subarticular recess stenosis. Moderate bilateral foraminal  stenosis.   L3-L4: Broad-based disc bulge. Moderate bilateral facet arthropathy. Severe spinal stenosis. Severe bilateral foraminal stenosis.   L4-L5: Broad-based disc bulge. Severe bilateral facet arthropathy. Mild bilateral foraminal stenosis. Severe spinal stenosis.   L5-S1: Broad-based disc bulge. Severe left and mild right facet arthropathy. Severe bilateral foraminal stenosis.   IMPRESSION: 1. Lumbar spine spondylosis as described above. 2. No acute osseous injury of the lumbar spine. 3.  Aortic Atherosclerosis (ICD10-I70.0).    PATIENT SURVEYS:  FOTO 49  SCREENING FOR RED FLAGS: Bowel or bladder incontinence: Yes:     COGNITION: Overall cognitive status: Within functional limits for tasks assessed     SENSATION: Decreased R adductor region   MUSCLE LENGTH:  Thomas test limited bilaterally   POSTURE: anterior pelvic tilt  PALPATION: Significant tension of L piriformis and quadratus lumborum   LUMBAR ROM:   AROM eval  Flexion 54  Extension 6  Right lateral flexion Limited 50%  Left lateral flexion Limited 50 %  Right rotation Limited 50%  Left rotation Limited 50%    (Blank rows = not tested)    LOWER EXTREMITY MMT:    MMT Right eval Left eval  Hip flexion 4+ 4+  Hip extension    Hip abduction 4+ 4+  Hip adduction 4+ 4+  Hip internal rotation    Hip external rotation    Knee flexion 5 5  Knee extension 4+ 4+  Ankle dorsiflexion 5 5  Ankle plantarflexion 4+ 4+  Ankle inversion    Ankle eversion     (Blank rows = not tested)  LUMBAR SPECIAL TESTS:  Straight leg raise test: Positive, SI Compression/distraction test: Positive, and FABER test: Negative  FUNCTIONAL TESTS:  5 times sit to stand: 15.415 10 meter walk test: 10.94sec 0.965m/s     GAIT: Distance walked: 30 ft Assistive device utilized: None Level of assistance: Complete Independence Comments: antalgic gait pattern   TODAY'S TREATMENT: DATE: 09/27/23   Therex:    Supine SLR, 2x10 each side Supine B hamstring stretch, SLR, 30 sec bouts Supine B hamstring stretch, bent knee, 30 sec bouts Supine B figure 4 stretch, 30 sec bouts Supine B piriformis stretch, 30 sec bouts Supine hamstring curl with swiss ball, 2x10 Supine sciatic nerve glides, 30 sec bouts, x4 each LE  Hooklying PPT with tactile feedback for initial 3, 3 sec holds, 2x10 total  Standing lumbar extension at wall with UE support, 2x10     PATIENT EDUCATION:  Education details: goals, POC, HEP. Pt educated throughout session about proper posture and technique with exercises. Improved exercise technique, movement at target joints, use of target muscles after min to mod verbal, visual, tactile cues.  Person educated: Patient Education method: Explanation, Demonstration, Tactile cues, and Verbal cues Education comprehension: verbalized understanding, returned demonstration, verbal cues required, and tactile cues required  HOME EXERCISE PROGRAM:  Access Code: [email or web address] URL: [email or web address] Date: 08/15/2023 Prepared by: Maureen Ralphs  Exercises - Supine Lower Trunk Rotation  - 1-2 x daily - 3 sets - 30 hold - Hooklying Single Knee to Chest Stretch  - 1 x daily - 3 sets - 30 hold - Seated Piriformis Stretch with Trunk Bend  - 1-2 x daily - 3 sets - 30 hold - Seated Hamstring Stretch  - 1-2 x daily - 3 sets - 30 hold    Access Code: [facility] URL: [email or web address] Prepared by: Precious Bard  Exercises - Supine Lower Trunk Rotation  - 7 x weekly - 1 x daily - 2 sets - 10 reps - 5 hold - Supine Posterior Pelvic Tilt  - 7 x weekly - 1 x daily - 2 sets - 10 reps - 5 hold - Supine Piriformis Stretch with Foot on Ground  - 1 x daily - 7 x weekly - 2 sets - 1 reps - 30 hold     ASSESSMENT:  CLINICAL IMPRESSION:  Pt responded well to the exercises and noted a reduction in overall pain levels to be 7/10.  Pt put forth great effort  throughout, however continues to have reduced hip mobility as noted by the lack of figure 4 mobility.  Pt encouraged to perform as part of HEP in order to reduce overall pain levels and improve the mobility of the hip.  Pt will continue to benefit from skilled therapy to address remaining deficits in order to improve overall QoL and return to PLOF.    Goals recently addressed due to re-certification 3 visits prior and noted below.   OBJECTIVE IMPAIRMENTS: Abnormal gait, decreased activity tolerance, decreased endurance, decreased mobility, difficulty walking, decreased ROM, decreased strength, hypomobility, increased fascial restrictions, impaired perceived functional ability, impaired flexibility, impaired vision/preception, improper body mechanics, postural dysfunction, and pain.   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   REHAB POTENTIAL: Fair    CLINICAL DECISION MAKING: Evolving/moderate complexity  EVALUATION COMPLEXITY: Moderate   GOALS: Goals reviewed with patient? Yes  SHORT TERM GOALS: Target date: 08/06/2023  Patient will be independent in home exercise program to improve strength/mobility for better functional independence with ADLs. Baseline:10/7 HEP given 12/18: pt reports not as compliant due to increase in pain  Goal status: IN PROGRESS    LONG TERM GOALS: Target date: 10/29/2023  Patient will increase FOTO score to  equal to or greater than  67%   to demonstrate statistically significant improvement in mobility and quality of life.  Baseline: 49  12/18: 33 Goal status: IN PROGRESS  2.  Patient will report a worst pain of 3/10 on VAS in  low back  to improve tolerance with ADLs and reduced symptoms with activities.  Baseline: 10/7: 8/10  12/18:10/10 Goal status: IN PROGRESS  3. Patient will be modified independent in walking on even/uneven surface with least restrictive assistive device, for 20+ minutes without rest break, reporting some difficulty or less to improve walking tolerance with community ambulation including grocery shopping, going to church,etc.  Baseline: pain with walking  12/18: pt still reports difficulty and pain with prolonged distances  Goal status: IN PROGRESS  4.  Patient (> 51 years old) will complete five times sit to stand test in < 15 seconds indicating an increased LE strength and improved balance. Baseline: 15.415 sec, slight increase in L hip pain  12/18: deferred this visit due to 10/10 hip pain  Goal status: IN PROGRESS    PLAN:  PT FREQUENCY: 2x/week  PT DURATION: 8 weeks  PLANNED INTERVENTIONS: Therapeutic exercises, Therapeutic activity, Neuromuscular re-education, Balance training, Gait training, Patient/Family education, Self Care, Joint mobilization, Stair training, Vestibular training, Canalith repositioning, Visual/preceptual remediation/compensation, DME instructions, Dry Needling, Spinal mobilization, Cryotherapy, Moist heat, Splintting, Taping, Manual therapy, and Re-evaluation.   PLAN FOR NEXT SESSION:   pain reduction, piriformis stretching, core strengthening     Nolon Bussing, PT, DPT Physical Therapist - [facility]  09/27/23, 11:47 AM

## 2023-09-27 NOTE — Therapy (Signed)
OUTPATIENT PHYSICAL THERAPY THORACOLUMBAR TREATMENT   Patient Name: Kristen Kirk MRN: [id number] DOB:23-Aug-1951, 72 y.o., female Today's Date: 10/01/2023  END OF SESSION:  PT End of Session - 10/01/23 1142     Visit Number 11    Number of Visits 16    Date for PT Re-Evaluation 10/29/23    Authorization Type IE 05/05/2021    PT Start Time 1145    PT Stop Time 1229    PT Time Calculation (min) 44 min    Equipment Utilized During Treatment Gait belt    Activity Tolerance Patient tolerated treatment well;Patient limited by pain    Behavior During Therapy [facility] for tasks assessed/performed               Past Medical History:  Diagnosis Date   Anemia in chronic kidney disease    a. s/p aranesp 11/2013 now starting epo with HD 06/2014   Diabetes mellitus type II    a. Currently diet controlled since losing weight.   DJD (degenerative joint disease)    Essential hypertension    GERD (gastroesophageal reflux disease)    Glaucoma, both eyes    a. L>R   History of echocardiogram    a. 01/2015 Echo [facility]): EF ?55%, triv MR, mildly dil LA, nl PV.   History of end stage renal disease    previous on HD;   received kidney transplant 07/ 2018 (on dialysis since 2015 MWF)  followed by [facility] renal (Dr. Linus Salmons) considering transplant Access: LUE fistula Establishing with [facility]   History of stress test    a. 03/2013 [facility]): EF 63%, no ischemia.   History of syncope    a. 12/2015.   History of Urge Incontinence    HLD (hyperlipidemia)    Osteoarthritis    Osteoporosis 05/03/2019   DEXA 04/2019 - 1.5 spine, -2.1 hip, -2.5 forearm Check phosphate and PTH to guide further management in h/o kidney transplant   S/P kidney transplant 04/2017   @[facility] ---   per pt one kidney from living-donor   Vaginal atrophy    a. vagifem caused spotting, normal TVS   Vitamin D deficiency    Wears glasses    stated on 05/31/2022   Wears partial dentures    upper 2 teeth. 05/31/2022    Past Surgical History:  Procedure Laterality Date   ANAL RECTAL MANOMETRY N/A 03/16/2021   Procedure: ANO RECTAL MANOMETRY;  Surgeon: Napoleon Form, MD;  Location: [facility];  Service: Endoscopy;  Laterality: N/A;   BLADDER SUSPENSION  2011   for stress incontinence-Dr. MacDiarmid   BREAST BIOPSY Right 12/15/2020   fat necrosis   CATARACT EXTRACTION W/ INTRAOCULAR LENS IMPLANT Bilateral    left 02-25-2021;  right ?   COLONOSCOPY  03/2019   diverticulosis, rpt 10 yrs (Nandigam)   KIDNEY TRANSPLANT Right 04/03/2017   [facility])   left eye laser surgery  06/2011   LIGATION OF ARTERIOVENOUS  FISTULA  10/2017   thrombosed L arm AFV   NEPHRECTOMY TRANSPLANTED ORGAN     REFRACTIVE SURGERY     TUBAL LIGATION Bilateral    1990s   Patient Active Problem List   Diagnosis Date Noted   Secondary corneal edema of left eye 08/02/2023   Cystoid macular degeneration, 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   Anemia in chronic kidney disease    Essential hypertension    UTI (urinary tract infection)    Type II diabetes mellitus with renal manifestations (HCC)    Immunosuppressed  status (HCC) 04/15/2022   Health maintenance examination 04/14/2022   Constipation due to outlet dysfunction    Acquired leg length discrepancy 01/28/2021   ASCUS of cervix with negative high risk HPV 10/01/2020   Thoracic spine pain 01/05/2020   Chronic lower back pain 10/08/2019   Osteoporosis 05/03/2019   Advanced care planning/counseling discussion 03/20/2019   MCI (mild cognitive impairment) with memory loss 03/20/2019   History of renal transplant 04/01/2017   Incontinence of feces 01/11/2017    Class: Acute   Insomnia 01/14/2013   Xerostomia 01/29/2012   Medicare annual wellness visit, subsequent 07/20/2011   Vaginal atrophy 04/17/2011   Type 2 diabetes mellitus with other specified complication (HCC)    Hyperlipidemia associated with type 2 diabetes mellitus (HCC)    GERD (gastroesophageal reflux disease)     Mixed incontinence urge and stress    Vitamin D deficiency 05/19/2009   MUSCLE CRAMPS 02/22/2009   Alopecia 10/05/2008   Vertigo 07/31/2008   Osteoarthritis 01/03/2007   Primary open angle glaucoma (POAG) of both eyes, severe stage 10/03/1999    PCP: Eustaquio Boyden MD  REFERRING PROVIDER: Liana Gerold MD   REFERRING DIAG: lumbar radiculopathy, lumbar spondylosis, spondylolisthesis of lumbar spine, spinal stenosis of lumbar region  Rationale for Evaluation and Treatment: Rehabilitation  THERAPY DIAG:  Other low back pain  Muscle weakness (generalized)  Other abnormalities of gait and mobility  ONSET DATE: 1-2 weeks ago new pain began.   SUBJECTIVE:                                                                                                                                                                                           SUBJECTIVE STATEMENT:  Patient reports she had a long walk back to PT from the car. Reports 8/10 pain. Primariy through L leg.    PERTINENT HISTORY:  Patient presents to PT for evaluation of lumbar radiculopathy, lumbar spondylosis, spondylolisthesis of lumbar spine, spinal stenosis of lumbar region. Pmh includes renal transplant (2018), HTN, HLD, glaucoma, CKD, DM, osteoporosis, anemia, DM type II, DJD, GERD, vaginal atrophy, . She has a sacral stimulator. Per physician note she has one month of constant lateral left leg pain to her knee with pain worsening in the morning and with walking. In 2021 had L L5-S1 TF ESI. Patient reports pain is primarily in L hip, reports she was given a shot. Reports pain goes to back of her knee.   PAIN:  Are you having pain? yes This AM 9/10, 8/10 now  Worst Pain: 8/10  Least pain: 3-4/10   PRECAUTIONS: None  RED FLAGS: Bowel or bladder incontinence: Yes: fecal  incontinence     WEIGHT BEARING RESTRICTIONS: No  FALLS:  Has patient fallen in last 6 months? No  LIVING ENVIRONMENT: Lives with: lives  with their spouse Lives in: House/apartment Stairs:  basement downstairs,  two stories, has a chair lift  Has following equipment at home:  chair lift   OCCUPATION: retired   PLOF: Independent  PATIENT GOALS: to reduce pain     OBJECTIVE:  Note: Objective measures were completed at Evaluation unless otherwise noted.  DIAGNOSTIC FINDINGS:  EXAM: CT LUMBAR SPINE WITHOUT CONTRAST   TECHNIQUE: Multidetector CT imaging of the lumbar spine was performed without intravenous contrast administration. Multiplanar CT image reconstructions were also generated.   RADIATION DOSE REDUCTION: This exam was performed according to the departmental dose-optimization program which includes automated exposure control, adjustment of the mA and/or kV according to patient size and/or use of iterative reconstruction technique.   COMPARISON:  01/13/2020   FINDINGS: Segmentation: 5 lumbar type vertebrae.   Alignment: Grade 1 anterolisthesis of L4 on L5 secondary to facet disease. 2 mm retrolisthesis of L2 on L3 and L1 on L2.   Vertebrae: No acute fracture or aggressive osseous lesion.   Paraspinal and other soft tissues: Abdominal aortic atherosclerosis. Bilateral renal atrophy.   Other: None   Disc levels:   Disc spaces: Degenerative disease with disc height loss at T11-12, L1-2, L2-3, L4-5 and L5-S1.   T12-L1: Mild broad-based disc bulge. Mild bilateral facet arthropathy. No foraminal or central canal stenosis.   L1-L2: Broad-based disc bulge. Moderate bilateral facet arthropathy. Mild right foraminal stenosis. No left foraminal stenosis. No spinal stenosis.   L2-L3: Broad-based disc bulge. Mild bilateral facet arthropathy. Moderate spinal stenosis. Bilateral subarticular recess stenosis. Moderate bilateral foraminal stenosis.   L3-L4: Broad-based disc bulge. Moderate bilateral facet arthropathy. Severe spinal stenosis. Severe bilateral foraminal stenosis.   L4-L5: Broad-based  disc bulge. Severe bilateral facet arthropathy. Mild bilateral foraminal stenosis. Severe spinal stenosis.   L5-S1: Broad-based disc bulge. Severe left and mild right facet arthropathy. Severe bilateral foraminal stenosis.   IMPRESSION: 1. Lumbar spine spondylosis as described above. 2. No acute osseous injury of the lumbar spine. 3.  Aortic Atherosclerosis (ICD10-I70.0).    PATIENT SURVEYS:  FOTO 49  SCREENING FOR RED FLAGS: Bowel or bladder incontinence: Yes:     COGNITION: Overall cognitive status: Within functional limits for tasks assessed     SENSATION: Decreased R adductor region   MUSCLE LENGTH:  Thomas test limited bilaterally   POSTURE: anterior pelvic tilt  PALPATION: Significant tension of L piriformis and quadratus lumborum   LUMBAR ROM:   AROM eval  Flexion 54  Extension 6  Right lateral flexion Limited 50%  Left lateral flexion Limited 50 %  Right rotation Limited 50%  Left rotation Limited 50%    (Blank rows = not tested)    LOWER EXTREMITY MMT:    MMT Right eval Left eval  Hip flexion 4+ 4+  Hip extension    Hip abduction 4+ 4+  Hip adduction 4+ 4+  Hip internal rotation    Hip external rotation    Knee flexion 5 5  Knee extension 4+ 4+  Ankle dorsiflexion 5 5  Ankle plantarflexion 4+ 4+  Ankle inversion    Ankle eversion     (Blank rows = not tested)  LUMBAR SPECIAL TESTS:  Straight leg raise test: Positive, SI Compression/distraction test: Positive, and FABER test: Negative  FUNCTIONAL TESTS:  5 times sit to stand: 15.415 10 meter walk test: 10.94sec  0.915m/s     GAIT: Distance walked: 30 ft Assistive device utilized: None Level of assistance: Complete Independence Comments: antalgic gait pattern   TODAY'S TREATMENT: DATE: 10/01/23   Therex:    Supine B hamstring stretch, SLR, 30 sec bouts Supine B hamstring stretch, bent knee, 30 sec bouts Supine B figure 4 stretch, 30 sec bouts Supine B piriformis stretch,  30 sec bouts Supine hamstring curl with swiss ball, 2x10 TrA with swiss ball 10x  TrA with swiss ball and UE raises 10x Supine sciatic nerve glides, 30 sec bouts, x4 each LE RTB abduction 20x  Hooklying PPT with tactile feedback for initial 3, 3 sec holds, 2x10 total  Seated:  RTB df 15x each LE RTB hamstring curl 15x  Df prostretch x3 minutes  Distraction L hip 3x30 seconds   PATIENT EDUCATION:  Education details: goals, POC, HEP. Pt educated throughout session about proper posture and technique with exercises. Improved exercise technique, movement at target joints, use of target muscles after min to mod verbal, visual, tactile cues.  Person educated: Patient Education method: Explanation, Demonstration, Tactile cues, and Verbal cues Education comprehension: verbalized understanding, returned demonstration, verbal cues required, and tactile cues required  HOME EXERCISE PROGRAM:  Access Code: [email or web address] URL: [email or web address] Date: 08/15/2023 Prepared by: Maureen Ralphs  Exercises - Supine Lower Trunk Rotation  - 1-2 x daily - 3 sets - 30 hold - Hooklying Single Knee to Chest Stretch  - 1 x daily - 3 sets - 30 hold - Seated Piriformis Stretch with Trunk Bend  - 1-2 x daily - 3 sets - 30 hold - Seated Hamstring Stretch  - 1-2 x daily - 3 sets - 30 hold    Access Code: [facility] URL: [email or web address] Prepared by: Precious Bard  Exercises - Supine Lower Trunk Rotation  - 7 x weekly - 1 x daily - 2 sets - 10 reps - 5 hold - Supine Posterior Pelvic Tilt  - 7 x weekly - 1 x daily - 2 sets - 10 reps - 5 hold - Supine Piriformis Stretch with Foot on Ground  - 1 x daily - 7 x weekly - 2 sets - 1 reps - 30 hold     ASSESSMENT:  CLINICAL IMPRESSION:  Patient tolerates all exercises with no pain increase. Core activation is improving. Left leg distraction is helping with pain reduction. She has decreased antalgic gait pattern with  ambulation. Patient will continue to benefit from skilled physical to reduce pain and improve mobility.    OBJECTIVE IMPAIRMENTS: Abnormal gait, decreased activity tolerance, decreased endurance, decreased mobility, difficulty walking, decreased ROM, decreased strength, hypomobility, increased fascial restrictions, impaired perceived functional ability, impaired flexibility, impaired vision/preception, improper body mechanics, postural dysfunction, and pain.   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   REHAB POTENTIAL: Fair    CLINICAL DECISION MAKING: Evolving/moderate complexity  EVALUATION COMPLEXITY: Moderate   GOALS: Goals reviewed with patient? Yes  SHORT TERM GOALS: Target date: 08/06/2023  Patient will be independent in home exercise program to improve strength/mobility for better functional independence with ADLs. Baseline:10/7 HEP given 12/18: pt reports  not as compliant due to increase in pain  Goal status: IN PROGRESS    LONG TERM GOALS: Target date: 10/29/2023  Patient will increase FOTO score to equal to or greater than  67%   to demonstrate statistically significant improvement in mobility and quality of life.  Baseline: 49  12/18: 33 Goal status: IN PROGRESS  2.  Patient will report a worst pain of 3/10 on VAS in  low back  to improve tolerance with ADLs and reduced symptoms with activities.  Baseline: 10/7: 8/10   12/18:10/10 Goal status: IN PROGRESS  3. Patient will be modified independent in walking on even/uneven surface with least restrictive assistive device, for 20+ minutes without rest break, reporting some difficulty or less to improve walking tolerance with community ambulation including grocery shopping, going to church,etc.  Baseline: pain with walking  12/18: pt still reports difficulty and pain with prolonged distances  Goal status: IN PROGRESS  4.  Patient (> 27 years old) will complete five times sit to stand test in < 15 seconds indicating an increased LE strength and improved balance. Baseline: 15.415 sec, slight increase in L hip pain  12/18: deferred this visit due to 10/10 hip pain  Goal status: IN PROGRESS    PLAN:  PT FREQUENCY: 2x/week  PT DURATION: 8 weeks  PLANNED INTERVENTIONS: Therapeutic exercises, Therapeutic activity, Neuromuscular re-education, Balance training, Gait training, Patient/Family education, Self Care, Joint mobilization, Stair training, Vestibular training, Canalith repositioning, Visual/preceptual remediation/compensation, DME instructions, Dry Needling, Spinal mobilization, Cryotherapy, Moist heat, Splintting, Taping, Manual therapy, and Re-evaluation.   PLAN FOR NEXT SESSION:   pain reduction, piriformis stretching, core strengthening     Precious Bard, PT, DPT Physical Therapist - [facility]  Outpatient Physical Therapy- Main Campus [phone]    10/01/23, 12:44 PM

## 2023-10-01 ENCOUNTER — Ambulatory Visit: Payer: Medicare Other

## 2023-10-01 DIAGNOSIS — R2689 Other abnormalities of gait and mobility: Secondary | ICD-10-CM

## 2023-10-01 DIAGNOSIS — M5459 Other low back pain: Secondary | ICD-10-CM | POA: Diagnosis not present

## 2023-10-01 DIAGNOSIS — M6281 Muscle weakness (generalized): Secondary | ICD-10-CM | POA: Diagnosis not present

## 2023-10-01 DIAGNOSIS — R278 Other lack of coordination: Secondary | ICD-10-CM | POA: Diagnosis not present

## 2023-10-01 DIAGNOSIS — R293 Abnormal posture: Secondary | ICD-10-CM | POA: Diagnosis not present

## 2023-10-04 ENCOUNTER — Telehealth: Payer: Self-pay

## 2023-10-04 ENCOUNTER — Ambulatory Visit: Payer: Medicare Other | Attending: Orthopedic Surgery

## 2023-10-04 DIAGNOSIS — R2689 Other abnormalities of gait and mobility: Secondary | ICD-10-CM | POA: Insufficient documentation

## 2023-10-04 DIAGNOSIS — M6281 Muscle weakness (generalized): Secondary | ICD-10-CM | POA: Insufficient documentation

## 2023-10-04 DIAGNOSIS — M5459 Other low back pain: Secondary | ICD-10-CM | POA: Insufficient documentation

## 2023-10-04 NOTE — Telephone Encounter (Signed)
 Patient Name: MARIGNY BORRE MRN: [id number] DOB:05-21-1951, 73 y.o., female Today's Date: 10/04/2023  Called Patient due to today's no show. She answered and stated she was in too much pain to make it today. She did confirm her next visit on 10/08/2023 and stated she plans to attend.    Reyes LOISE London, PT 10/04/2023, 3:58 PM

## 2023-10-08 ENCOUNTER — Ambulatory Visit: Payer: Medicare Other

## 2023-10-08 ENCOUNTER — Telehealth: Payer: Self-pay

## 2023-10-08 NOTE — Telephone Encounter (Signed)
 Patient called due to no show. Patient reports she attempted to call but did not wait for voicemail. Informed of next appointment time.    Kristen Kirk  Leopoldo, PT, DPT Physical Therapist - [facility]  Outpatient Physical Therapy- Main Campus [phone]

## 2023-10-08 NOTE — Progress Notes (Addendum)
 Referring Physician:  Rilla Baller, MD [address] [place],  [place] [address]  Primary Physician:  Rilla Baller, MD  History of Present Illness: [name] has a history of renal transplant, HTN, hyperlipidemia, glaucoma, CKD, DM, osteoporosis.    She has sacral stimulator.  Last seen by me on 08/22/23 for constant LBP and intermittent left lateral thigh pain. She has known lumbar spondylosis with severe central stenosis L3-L5. Also with multilevel foraminal stenosis.   We discussed repeat MBB with PMR and that she would continue with PT.   She had left L3, L4, and L5 MBB with Dr. Avanell on 09/06/23. She had no relief with these injections. PMR has her on norco.   She has done 5 additional visits of PT since her last visit with me.   She is here for follow up.   She continues with constant LBP that is worse in the morning and also worse with standing and walking. She has intermittent left lateral thigh pain. No numbness, tingling, or weakness in legs.   She had no relief with her second MBB. She is in PT and has not seen any significant relief.   No NSAIDs due to h/p renal transplant and CKD. She is prn tylenol .   Bowel/Bladder Dysfunction: she has urinary frequency. History of bowel incontinence for years- has sacral stimulator.   Conservative measures:  Physical therapy: initial PT eval on 07/09/23 and has done 10 more visits (last on 10/01/23) Multimodal medical therapy including regular antiinflammatories: prednisone , tylenol , ultram , tylenol  #3, zanaflex , flexeril , norco Injections:  09/06/2023: MBB to the left L5-S1 and L4-5 facet joints (8/10 to 2/10)  08/01/2023: MBB to the left L5-S1 and L4-5 facet joints (8/10 to 1/10)  06/05/2023: Left L5-S1 transforaminal ESI 02/04/2020: Left L5-S1 TF ESI   Past Surgery: no previous spinal surgeries   Kristen Kirk has no symptoms of cervical myelopathy.  The symptoms are causing a significant  impact on the patient's life.   Review of Systems:  A 10 point review of systems is negative, except for the pertinent positives and negatives detailed in the HPI.  Past Medical History: Past Medical History:  Diagnosis Date   Anemia in chronic kidney disease    a. s/p aranesp 11/2013 now starting epo with HD 06/2014   Diabetes mellitus type II    a. Currently diet controlled since losing weight.   DJD (degenerative joint disease)    Essential hypertension    GERD (gastroesophageal reflux disease)    Glaucoma, both eyes    a. L>R   History of echocardiogram    a. 01/2015 [facility]): EF ?55%, triv MR, mildly dil LA, nl PV.   History of end stage renal disease    previous on HD;   received kidney transplant 07/ 2018 (on dialysis since 2015 MWF)  followed by [facility] renal (Dr. Sigmund) considering transplant Access: LUE fistula Establishing with [facility]   History of stress test    a. 03/2013 [facility]): EF 63%, no ischemia.   History of syncope    a. 12/2015.   History of Urge Incontinence    HLD (hyperlipidemia)    Osteoarthritis    Osteoporosis 05/03/2019   DEXA 04/2019 - 1.5 spine, -2.1 hip, -2.5 forearm Check phosphate and PTH to guide further management in h/o kidney transplant   S/P kidney transplant 04/2017   @[facility] ---   per pt one kidney from living-donor   Vaginal atrophy    a.  vagifem  caused spotting, normal TVS   Vitamin D  deficiency    Wears glasses    stated on 05/31/2022   Wears partial dentures    upper 2 teeth. 05/31/2022    Past Surgical History: Past Surgical History:  Procedure Laterality Date   ANAL RECTAL MANOMETRY N/A 03/16/2021   Procedure: ANO RECTAL MANOMETRY;  Surgeon: Shila Gustav GAILS, MD;  Location: [facility] END[facility]COPY;  Service: Endoscopy;  Laterality: N/A;   BLADDER SUSPENSION  2011   for stress incontinence-Dr. MacDiarmid   BREAST BIOPSY Right 12/15/2020   fat necrosis   CATARACT EXTRACTION W/ INTRAOCULAR LENS IMPLANT Bilateral    left  02-25-2021;  right ?   COLONOSCOPY  03/2019   diverticulosis, rpt 10 yrs (Nandigam)   KIDNEY TRANSPLANT Right 04/03/2017   [facility])   left eye laser surgery  06/2011   LIGATION OF ARTERIOVENOUS  FISTULA  10/2017   thrombosed L arm AFV   NEPHRECTOMY TRANSPLANTED ORGAN     REFRACTIVE SURGERY     TUBAL LIGATION Bilateral    1990s    Allergies: Allergies as of 08/20/2023 - Review Complete 08/15/2023  Allergen Reaction Noted   Brimonidine  03/02/2014   Shellfish-derived products Swelling 03/02/2014   Tramadol  Other (See Comments) 01/05/2020    Medications: Outpatient Encounter Medications as of 08/20/2023  Medication Sig   acetaminophen  (TYLENOL ) 500 MG tablet Take 1 tablet (500 mg total) by mouth 3 (three) times daily as needed for moderate pain.   alendronate  (FOSAMAX ) 70 MG tablet Take 1 tablet (70 mg total) by mouth every 7 (seven) days. Take with a full glass of water  on an empty stomach.   amLODipine  (NORVASC ) 2.5 MG tablet Take 2.5 mg by mouth daily.   apraclonidine  (IOPIDINE ) 0.5 % ophthalmic solution Place 1 drop into the right eye daily.   aspirin  81 MG tablet Take 81 mg by mouth daily.   Blood Glucose Monitoring Suppl (ONE TOUCH ULTRA SYSTEM KIT) W/DEVICE KIT 1 kit by Does not apply route once.   carvedilol  (COREG ) 25 MG tablet Take 12.5 mg by mouth 2 (two) times daily with a meal.   Cholecalciferol  (VITAMIN D3) 1000 units CAPS Take 2 capsules (2,000 Units total) by mouth daily.   docusate sodium  (COLACE) 100 MG capsule Take 100 mg by mouth 2 (two) times daily as needed for mild constipation or moderate constipation.   dorzolamide -timolol  (COSOPT ) 2-0.5 % ophthalmic solution Apply to eye.   famotidine  (PEPCID ) 20 MG tablet Take 1 tablet (20 mg total) by mouth at bedtime.   glucose blood test strip Check sugars once daily   hydrochlorothiazide  (HYDRODIURIL ) 12.5 MG tablet Take 1 tablet (12.5 mg total) by mouth daily.   ketorolac  (ACULAR ) 0.5 % ophthalmic solution Place  1 drop into the left eye 4 (four) times daily.   latanoprost  (XALATAN ) 0.005 % ophthalmic solution Place 1 drop into the right eye at bedtime.   Melatonin 3 MG CAPS Take 1-2 capsules (3-6 mg total) by mouth at bedtime as needed (sleep).   memantine  (NAMENDA ) 5 MG tablet Take 1 tablet (5 mg total) by mouth daily.   Multiple Vitamin (MULTIVITAMIN) tablet Take 1 tablet by mouth daily.   mycophenolate  (CELLCEPT ) 500 MG tablet Take 500 mg by mouth 2 (two) times daily.   NEOMYCIN -POLYMYXIN-HYDROCORTISONE (CORTISPORIN ) 1 % SOLN OTIC solution Apply 1-2 drops to toe BID after soaking   pantoprazole  (PROTONIX ) 20 MG tablet Take 1 tablet by mouth once daily   pravastatin  (PRAVACHOL ) 80 MG tablet Take 1  tablet (80 mg total) by mouth every evening.   prednisoLONE  acetate (PRED FORTE ) 1 % ophthalmic suspension Place 1 drop into the left eye every 2 (two) hours while awake.   tacrolimus  (PROGRAF ) 0.5 MG capsule Take 0.5-1 mg by mouth See admin instructions. Take 2 capsules (1mg ) by mouth every morning and take 1 capsule (0.5mg ) by mouth every night   tiZANidine  (ZANAFLEX ) 2 MG tablet Take 1 tablet (2 mg total) by mouth 3 times/day as needed-between meals & bedtime for muscle spasms.   No facility-administered encounter medications on file as of 08/20/2023.    Social History: Social History   Tobacco Use   Smoking status: Former    Current packs/day: 0.00    Types: Cigarettes    Quit date: 1987    Years since quitting: 37.8   Smokeless tobacco: Never   Tobacco comments:    Remote- quit ~ late 1980's.  Vaping Use   Vaping status: Never Used  Substance Use Topics   Alcohol  use: No    Alcohol /week: 0.0 standard drinks of alcohol    Drug use: No    Family Medical History: Family History  Problem Relation Age of Onset   Other Father        she did not know her father.   Diabetes Mother    Heart failure Mother        died @ 26   Stroke Brother    Hyperlipidemia Brother    Hypertension Brother     Hyperlipidemia Brother    Hypertension Brother    Hyperlipidemia Sister    Hypertension Sister    Hyperlipidemia Sister    Hypertension Sister    Heart failure Maternal Grandfather    Breast cancer Neg Hx    Colon cancer Neg Hx    Esophageal cancer Neg Hx    Stomach cancer Neg Hx    Rectal cancer Neg Hx     Physical Examination: There were no vitals filed for this visit.    Awake, alert, oriented to person, place, and time.  Speech is clear and fluent. Fund of knowledge is appropriate.   Cranial Nerves: Pupils equal round and reactive to light.  Facial tone is symmetric.    minimal lower lumbar tenderness.   No abnormal lesions on exposed skin.   Strength: Side Iliopsoas Quads Hamstring PF DF EHL  R 5 5 5 5 5 5   L 5 5 5 5 5 5    Reflexes are 2+ and symmetric at the patella and achilles.    Clonus is not present.   Bilateral lower extremity sensation is intact to light touch.     She has good ROM of left and right hip with no pain.   Gait is normal.     Medical Decision Making  Imaging: None  Assessment and Plan: Ms. Duerr has constant LBP that is worse in the morning and also worse with standing and walking. She has intermittent left lateral thigh pain. No numbness, tingling, or weakness in legs.   She had no relief with her second MBB. She is in PT and has not seen any significant relief.   She has known lumbar spondylosis with severe central stenosis L3-L5. Also with multilevel foraminal stenosis.   History of renal transplant (only has 1 kidney) and CKD.   Treatment options discussed with patient and following plan made:   - No relief with PT, injections, medications, or time. She is interested in seeing if she is a candidate for surgery.  -  She can stop PT.  - Would hold on further injections.  - She has sacral stimulator and cannot have MRI.  - Recommend CT myelogram of lumbar spine for further evaluation.  - Will get in touch with her transplant  specialist at [facility] to make sure she can have CT myelogram Kristen Kirk [phone] or Kristen Lie RN [phone]. Will also discuss with radiology here (Kristen Kirk).  - Will call and let her know what I find out about myelogram.  - If she is able to have CT myelogram done, then will have her follow up with Kristen Kirk or Kristen Kirk to review and discuss options.   Spoke with Kristen Lie RN regarding CT myelogram. She discussed with Dr. Loader and patient is okay to have CT myelogram done. If possible, the recommend she get IV fluids (500mg  of NS) during procedure. If not, they ask we remind patient to stay well hydrated.  I spent a total of 20 minutes in face-to-face and non-face-to-face activities related to this patient's care today including review of outside records, review of imaging, review of symptoms, physical exam, discussion of differential diagnosis, discussion of treatment options, and documentation.   ADDENDUM 10/22/23:  Reviewed CT myelogram with radiology (Dr. Karalee)- he recommends this be done at [facility] and I will put in comments it was discussed with him and she need IV hydration. Spoke with patient and she agrees to proceed.   Kristen Boys PA-C Dept. of Neurosurgery

## 2023-10-09 IMAGING — MG MM DIGITAL DIAGNOSTIC UNILAT*R* W/ TOMO W/ CAD
8 series · 8 of 24 positions shown · non-contrast
Comparison: Previous exam(s).

CLINICAL DATA: Callback for RIGHT breast asymmetry. History of 2
site RIGHT breast biopsy which demonstrated fat necrosis.

EXAM:
DIGITAL DIAGNOSTIC UNILATERAL RIGHT MAMMOGRAM WITH TOMOSYNTHESIS AND
CAD
TECHNIQUE: Right digital diagnostic mammography and breast tomosynthesis was
performed. The images were evaluated with computer-aided detection.

[R ML synth-2D]
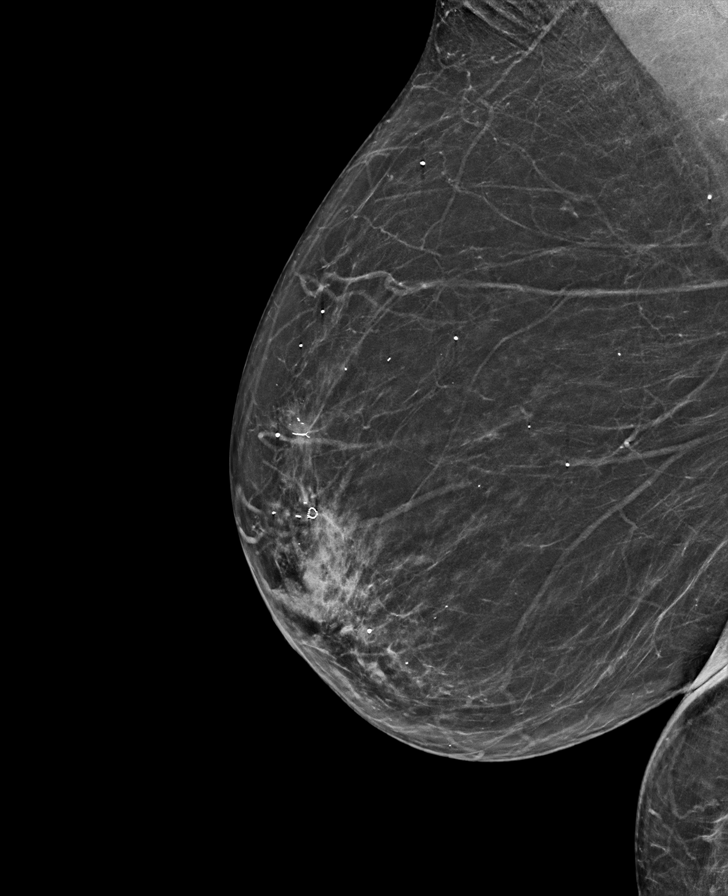

[R MLO synth-2D (1 of 2)]
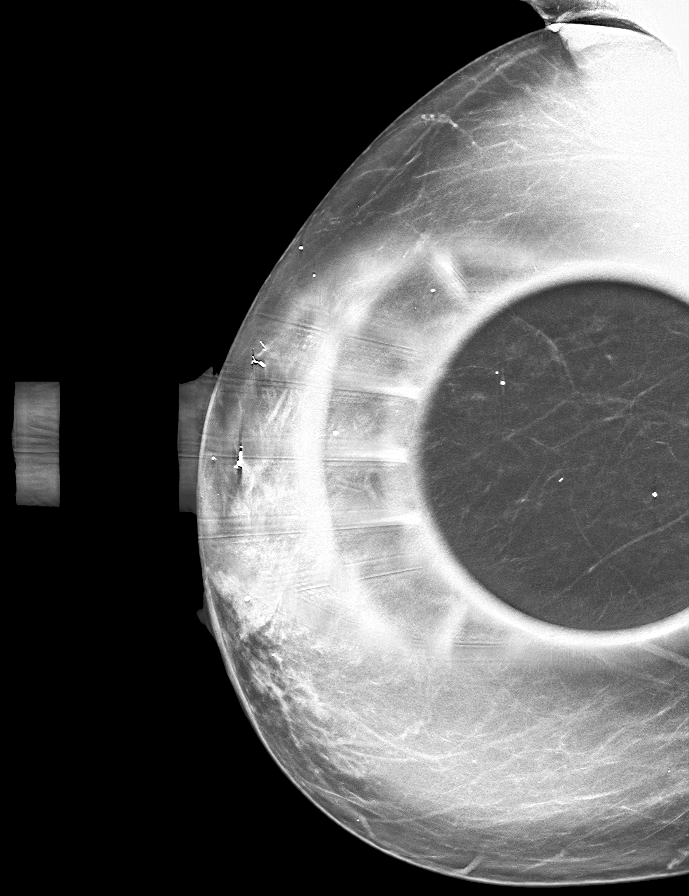

[R MLO synth-2D (2 of 2)]
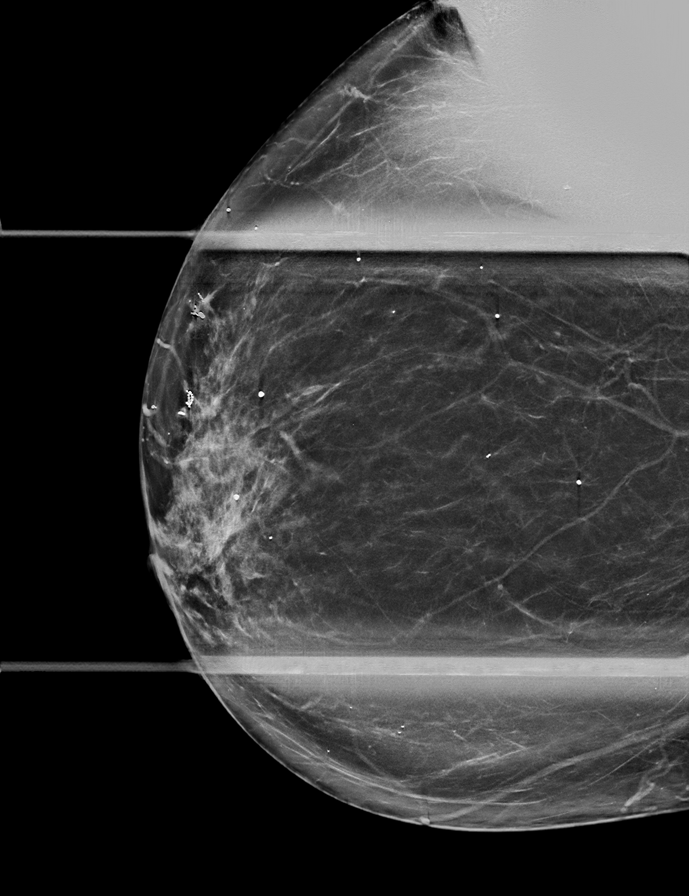

[R CC synth-2D]
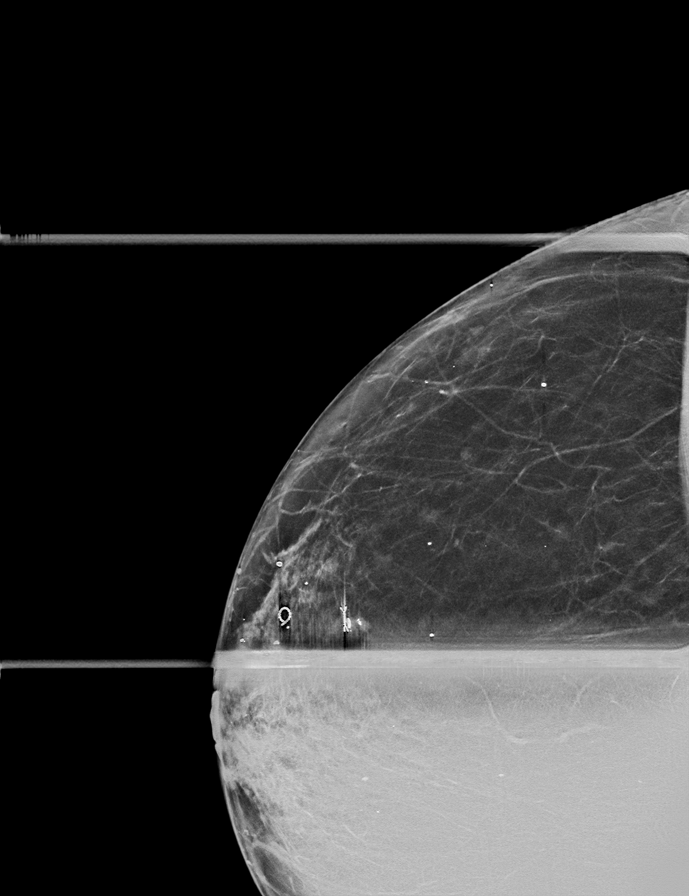

[R CC tomo · tomo slice 29/57.0]
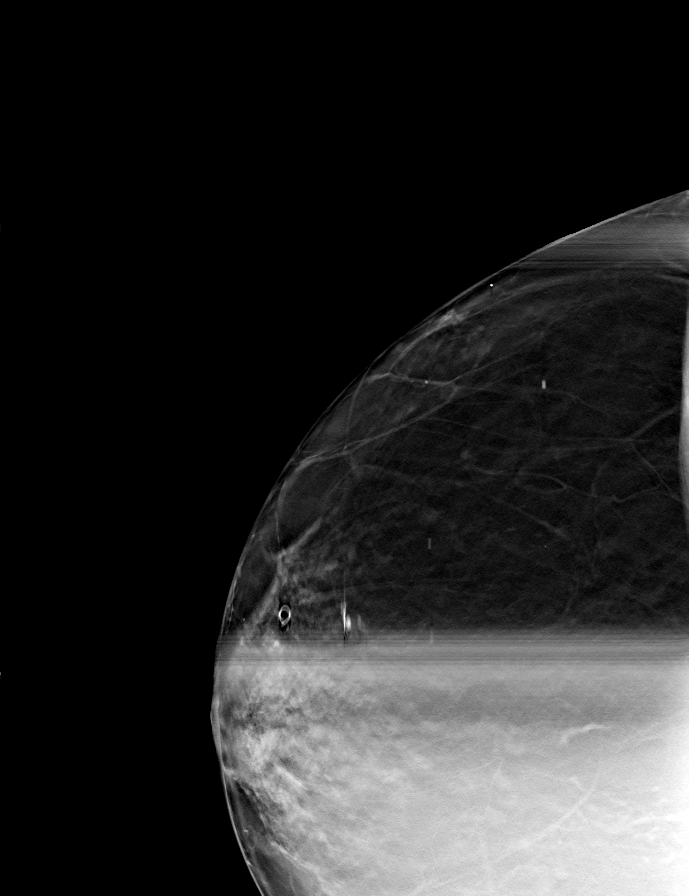

[R ML tomo · tomo slice 35/69.0]
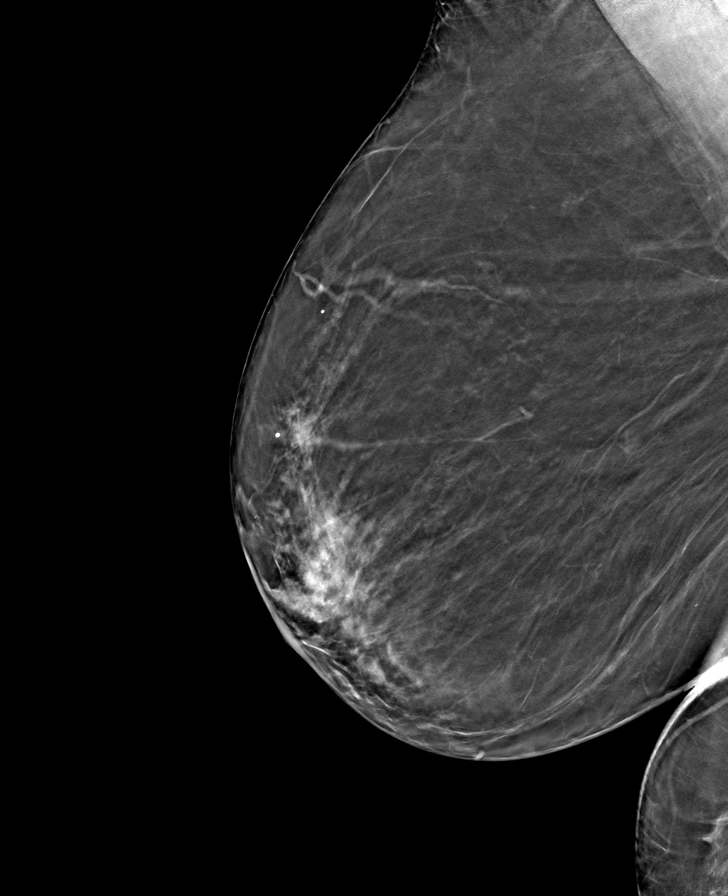

[R MLO tomo (1 of 2) · tomo slice 35/68.0]
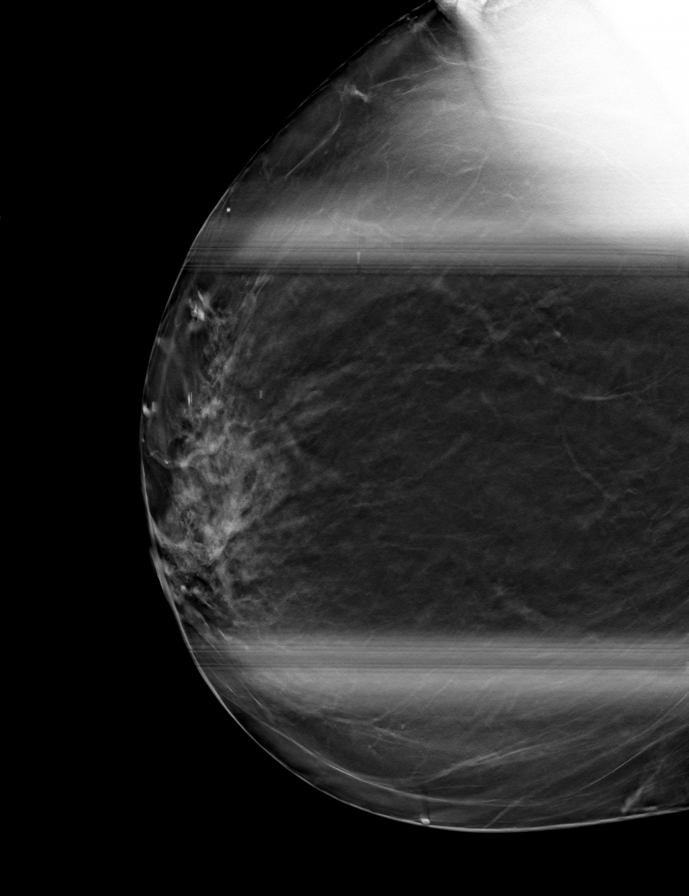

[R MLO tomo (2 of 2) · tomo slice 29/56.0]
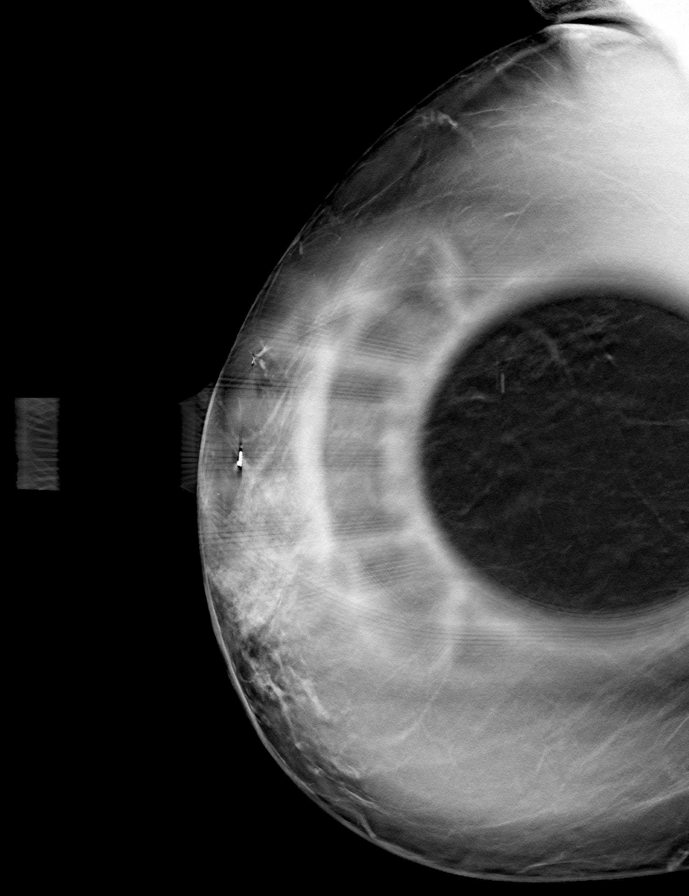

[8 of 24 positions shown; findings below may reference images not displayed]

ACR Breast Density Category b: There are scattered areas of
fibroglandular density.
FINDINGS: The previously described finding does not persist with additional
views, consistent with superimposed fibroglandular tissue. There are
scattered areas of fat containing asymmetries noted throughout the
RIGHT upper outer breast, consistent with benign fat necrosis/oil
cysts. No suspicious mass, microcalcification, or other finding is
identified. Patient has a history of renal transplant.
IMPRESSION: No mammographic evidence of malignancy.

RECOMMENDATION:
Screening mammogram in one year.(Code:[id number]-[id number])

I have discussed the findings and recommendations with the patient.
If applicable, a reminder letter will be sent to the patient
regarding the next appointment.

BI-RADS CATEGORY  2: Benign.

## 2023-10-09 NOTE — Therapy (Signed)
 OUTPATIENT PHYSICAL THERAPY THORACOLUMBAR TREATMENT   Patient Name: Kristen Kirk MRN: [id number] DOB:07-30-1951, 73 y.o., female Today's Date: 10/10/2023  END OF SESSION:  PT End of Session - 10/10/23 1148     Visit Number 12    Number of Visits 16    Date for PT Re-Evaluation 10/29/23    Authorization Type IE 05/05/2021    PT Start Time 1146    PT Stop Time 1229    PT Time Calculation (min) 43 min    Equipment Utilized During Treatment Gait belt    Activity Tolerance Patient tolerated treatment well;Patient limited by pain    Behavior During Therapy [facility] for tasks assessed/performed                Past Medical History:  Diagnosis Date   Anemia in chronic kidney disease    a. s/p aranesp 11/2013 now starting epo with HD 06/2014   Diabetes mellitus type II    a. Currently diet controlled since losing weight.   DJD (degenerative joint disease)    Essential hypertension    GERD (gastroesophageal reflux disease)    Glaucoma, both eyes    a. L>R   History of echocardiogram    a. 01/2015 Echo [facility]): EF ?55%, triv MR, mildly dil LA, nl PV.   History of end stage renal disease    previous on HD;   received kidney transplant 07/ 2018 (on dialysis since 2015 MWF)  followed by [facility] renal (Dr. Sigmund) considering transplant Access: LUE fistula Establishing with [facility]   History of stress test    a. 03/2013 Myoview [facility]): EF 63%, no ischemia.   History of syncope    a. 12/2015.   History of Urge Incontinence    HLD (hyperlipidemia)    Osteoarthritis    Osteoporosis 05/03/2019   DEXA 04/2019 - 1.5 spine, -2.1 hip, -2.5 forearm Check phosphate and PTH to guide further management in h/o kidney transplant   S/P kidney transplant 04/2017   @[facility] ---   per pt one kidney from living-donor   Vaginal atrophy    a. vagifem  caused spotting, normal TVS   Vitamin D  deficiency    Wears glasses    stated on 05/31/2022   Wears partial dentures    upper 2 teeth. 05/31/2022    Past Surgical History:  Procedure Laterality Date   ANAL RECTAL MANOMETRY N/A 03/16/2021   Procedure: ANO RECTAL MANOMETRY;  Surgeon: Shila Gustav GAILS, MD;  Location: [facility];  Service: Endoscopy;  Laterality: N/A;   BLADDER SUSPENSION  2011   for stress incontinence-Dr. MacDiarmid   BREAST BIOPSY Right 12/15/2020   fat necrosis   CATARACT EXTRACTION W/ INTRAOCULAR LENS IMPLANT Bilateral    left 02-25-2021;  right ?   COLONOSCOPY  03/2019   diverticulosis, rpt 10 yrs (Nandigam)   KIDNEY TRANSPLANT Right 04/03/2017   [facility])   left eye laser surgery  06/2011   LIGATION OF ARTERIOVENOUS  FISTULA  10/2017   thrombosed L arm AFV   NEPHRECTOMY TRANSPLANTED ORGAN     REFRACTIVE SURGERY     TUBAL LIGATION Bilateral    1990s   Patient Active Problem List   Diagnosis Date Noted   Secondary corneal edema of left eye 08/02/2023   Cystoid macular degeneration, 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   Anemia in chronic kidney disease    Essential hypertension    UTI (urinary tract infection)    Type II diabetes mellitus with renal manifestations (HCC)  Immunosuppressed status (HCC) 04/15/2022   Health maintenance examination 04/14/2022   Constipation due to outlet dysfunction    Acquired leg length discrepancy 01/28/2021   ASCUS of cervix with negative high risk HPV 10/01/2020   Thoracic spine pain 01/05/2020   Chronic lower back pain 10/08/2019   Osteoporosis 05/03/2019   Advanced care planning/counseling discussion 03/20/2019   MCI (mild cognitive impairment) with memory loss 03/20/2019   History of renal transplant 04/01/2017   Incontinence of feces 01/11/2017    Class: Acute   Insomnia 01/14/2013   Xerostomia 01/29/2012   Medicare annual wellness visit, subsequent 07/20/2011   Vaginal atrophy 04/17/2011   Type 2 diabetes mellitus with other specified complication (HCC)    Hyperlipidemia associated with type 2 diabetes mellitus (HCC)    GERD (gastroesophageal reflux disease)     Mixed incontinence urge and stress    Vitamin D  deficiency 05/19/2009   MUSCLE CRAMPS 02/22/2009   Alopecia 10/05/2008   Vertigo 07/31/2008   Osteoarthritis 01/03/2007   Primary open angle glaucoma (POAG) of both eyes, severe stage 10/03/1999    PCP: Rilla Baller MD  REFERRING PROVIDER: Remigio Baller MD   REFERRING DIAG: lumbar radiculopathy, lumbar spondylosis, spondylolisthesis of lumbar spine, spinal stenosis of lumbar region  Rationale for Evaluation and Treatment: Rehabilitation  THERAPY DIAG:  Other low back pain  Muscle weakness (generalized)  Other abnormalities of gait and mobility  ONSET DATE: 1-2 weeks ago new pain began.   SUBJECTIVE:                                                                                                                                                                                           SUBJECTIVE STATEMENT:  Patient missed last two session. Reports her leg hasn't been too bad.    PERTINENT HISTORY:  Patient presents to PT for evaluation of lumbar radiculopathy, lumbar spondylosis, spondylolisthesis of lumbar spine, spinal stenosis of lumbar region. Pmh includes renal transplant (2018), HTN, HLD, glaucoma, CKD, DM, osteoporosis, anemia, DM type II, DJD, GERD, vaginal atrophy, . She has a sacral stimulator. Per physician note she has one month of constant lateral left leg pain to her knee with pain worsening in the morning and with walking. In 2021 had L L5-S1 TF ESI. Patient reports pain is primarily in L hip, reports she was given a shot. Reports pain goes to back of her knee.   PAIN:  Are you having pain? yes This AM 9/10, 8/10 now  Worst Pain: 8/10  Least pain: 3-4/10   PRECAUTIONS: None  RED FLAGS: Bowel or bladder incontinence: Yes: fecal incontinence     WEIGHT BEARING  RESTRICTIONS: No  FALLS:  Has patient fallen in last 6 months? No  LIVING ENVIRONMENT: Lives with: lives with their spouse Lives in:  House/apartment Stairs:  basement downstairs,  two stories, has a chair lift  Has following equipment at home:  chair lift   OCCUPATION: retired   PLOF: Independent  PATIENT GOALS: to reduce pain     OBJECTIVE:  Note: Objective measures were completed at Evaluation unless otherwise noted.  DIAGNOSTIC FINDINGS:  EXAM: CT LUMBAR SPINE WITHOUT CONTRAST   TECHNIQUE: Multidetector CT imaging of the lumbar spine was performed without intravenous contrast administration. Multiplanar CT image reconstructions were also generated.   RADIATION DOSE REDUCTION: This exam was performed according to the departmental dose-optimization program which includes automated exposure control, adjustment of the mA and/or kV according to patient size and/or use of iterative reconstruction technique.   COMPARISON:  01/13/2020   FINDINGS: Segmentation: 5 lumbar type vertebrae.   Alignment: Grade 1 anterolisthesis of L4 on L5 secondary to facet disease. 2 mm retrolisthesis of L2 on L3 and L1 on L2.   Vertebrae: No acute fracture or aggressive osseous lesion.   Paraspinal and other soft tissues: Abdominal aortic atherosclerosis. Bilateral renal atrophy.   Other: None   Disc levels:   Disc spaces: Degenerative disease with disc height loss at T11-12, L1-2, L2-3, L4-5 and L5-S1.   T12-L1: Mild broad-based disc bulge. Mild bilateral facet arthropathy. No foraminal or central canal stenosis.   L1-L2: Broad-based disc bulge. Moderate bilateral facet arthropathy. Mild right foraminal stenosis. No left foraminal stenosis. No spinal stenosis.   L2-L3: Broad-based disc bulge. Mild bilateral facet arthropathy. Moderate spinal stenosis. Bilateral subarticular recess stenosis. Moderate bilateral foraminal stenosis.   L3-L4: Broad-based disc bulge. Moderate bilateral facet arthropathy. Severe spinal stenosis. Severe bilateral foraminal stenosis.   L4-L5: Broad-based disc bulge. Severe bilateral  facet arthropathy. Mild bilateral foraminal stenosis. Severe spinal stenosis.   L5-S1: Broad-based disc bulge. Severe left and mild right facet arthropathy. Severe bilateral foraminal stenosis.   IMPRESSION: 1. Lumbar spine spondylosis as described above. 2. No acute osseous injury of the lumbar spine. 3.  Aortic Atherosclerosis (ICD10-I70.0).    PATIENT SURVEYS:  FOTO 49  SCREENING FOR RED FLAGS: Bowel or bladder incontinence: Yes:     COGNITION: Overall cognitive status: Within functional limits for tasks assessed     SENSATION: Decreased R adductor region   MUSCLE LENGTH:  Thomas test limited bilaterally   POSTURE: anterior pelvic tilt  PALPATION: Significant tension of L piriformis and quadratus lumborum   LUMBAR ROM:   AROM eval  Flexion 54  Extension 6  Right lateral flexion Limited 50%  Left lateral flexion Limited 50 %  Right rotation Limited 50%  Left rotation Limited 50%    (Blank rows = not tested)    LOWER EXTREMITY MMT:    MMT Right eval Left eval  Hip flexion 4+ 4+  Hip extension    Hip abduction 4+ 4+  Hip adduction 4+ 4+  Hip internal rotation    Hip external rotation    Knee flexion 5 5  Knee extension 4+ 4+  Ankle dorsiflexion 5 5  Ankle plantarflexion 4+ 4+  Ankle inversion    Ankle eversion     (Blank rows = not tested)  LUMBAR SPECIAL TESTS:  Straight leg raise test: Positive, SI Compression/distraction test: Positive, and FABER test: Negative  FUNCTIONAL TESTS:  5 times sit to stand: 15.415 10 meter walk test: 10.94sec 0.962m/s     GAIT: Distance  walked: 30 ft Assistive device utilized: None Level of assistance: Complete Independence Comments: antalgic gait pattern   TODAY'S TREATMENT: DATE: 10/10/23   Therex:  Supine B hamstring stretch, SLR, 30 sec bouts Supine B hamstring stretch, bent knee, 30 sec bouts Supine B figure 4 stretch, 30 sec bouts Supine B piriformis stretch, 30 sec bouts BTB abduction 10x 2  sets BTB march supine 10x; 2 sets   Seated:  RTB df 15x each LE BTB hamstring curl 15x ; 2 sets Heel toe raises 15x   Distraction L hip 3x30 seconds   PATIENT EDUCATION:  Education details: goals, POC, HEP. Pt educated throughout session about proper posture and technique with exercises. Improved exercise technique, movement at target joints, use of target muscles after min to mod verbal, visual, tactile cues.  Person educated: Patient Education method: Explanation, Demonstration, Tactile cues, and Verbal cues Education comprehension: verbalized understanding, returned demonstration, verbal cues required, and tactile cues required  HOME EXERCISE PROGRAM:  Access Code: [email or web address] URL: [email or web address] Date: 08/15/2023 Prepared by: Reyes London  Exercises - Supine Lower Trunk Rotation  - 1-2 x daily - 3 sets - 30 hold - Hooklying Single Knee to Chest Stretch  - 1 x daily - 3 sets - 30 hold - Seated Piriformis Stretch with Trunk Bend  - 1-2 x daily - 3 sets - 30 hold - Seated Hamstring Stretch  - 1-2 x daily - 3 sets - 30 hold    Access Code: [facility] URL: [email or web address] Prepared by: Cristin Penaflor  Exercises - Supine Lower Trunk Rotation  - 7 x weekly - 1 x daily - 2 sets - 10 reps - 5 hold - Supine Posterior Pelvic Tilt  - 7 x weekly - 1 x daily - 2 sets - 10 reps - 5 hold - Supine Piriformis Stretch with Foot on Ground  - 1 x daily - 7 x weekly - 2 sets - 1 reps - 30 hold     ASSESSMENT:  CLINICAL IMPRESSION: Patient reports no pain by end of session. Tolerates progressive strengthening with increased band resistance. Distraction reduces hip pain per patient report.  Patient will continue to benefit from skilled physical to reduce pain and improve mobility.    OBJECTIVE IMPAIRMENTS: Abnormal gait, decreased activity tolerance, decreased endurance, decreased mobility, difficulty walking, decreased ROM, decreased strength,  hypomobility, increased fascial restrictions, impaired perceived functional ability, impaired flexibility, impaired vision/preception, improper body mechanics, postural dysfunction, and pain.   ACTIVITY LIMITATIONS: carrying, lifting, bending, sitting, standing, squatting, sleeping, transfers, bed mobility, continence, dressing, reach over head, locomotion level, and caring for others  PARTICIPATION LIMITATIONS: meal prep, cleaning, laundry, interpersonal relationship, driving, shopping, community activity, yard work, and church  PERSONAL FACTORS: Age, Financial Risk Analyst, Past/current experiences, Transportation, and 3+ comorbidities: enal transplant (2018), HTN, HLD, glaucoma, CKD, DM, osteoporosis, anemia, DM type II, DJD, GERD, vaginal atrophy,  are also affecting patient's functional outcome.   REHAB POTENTIAL: Fair    CLINICAL DECISION MAKING: Evolving/moderate complexity  EVALUATION COMPLEXITY: Moderate   GOALS: Goals reviewed with patient? Yes  SHORT TERM GOALS: Target date: 08/06/2023  Patient will be independent in home exercise program to improve strength/mobility for better functional independence with ADLs. Baseline:10/7 HEP given 12/18: pt reports not as compliant due to increase in pain  Goal status: IN PROGRESS    LONG TERM GOALS: Target date: 10/29/2023  Patient will increase FOTO score to equal to or greater than  67%   to demonstrate statistically significant improvement in mobility and quality of  life.  Baseline: 49  12/18: 33 Goal status: IN PROGRESS  2.  Patient will report a worst pain of 3/10 on VAS in  low back  to improve tolerance with ADLs and reduced symptoms with activities.  Baseline: 10/7: 8/10  12/18:10/10 Goal status: IN PROGRESS  3. Patient will be modified independent in walking on even/uneven surface with least restrictive assistive device, for 20+ minutes without rest break, reporting some difficulty or less to improve walking tolerance with community  ambulation including grocery shopping, going to church,etc.  Baseline: pain with walking  12/18: pt still reports difficulty and pain with prolonged distances  Goal status: IN PROGRESS  4.  Patient (> 50 years old) will complete five times sit to stand test in < 15 seconds indicating an increased LE strength and improved balance. Baseline: 15.415 sec, slight increase in L hip pain  12/18: deferred this visit due to 10/10 hip pain  Goal status: IN PROGRESS    PLAN:  PT FREQUENCY: 2x/week  PT DURATION: 8 weeks  PLANNED INTERVENTIONS: Therapeutic exercises, Therapeutic activity, Neuromuscular re-education, Balance training, Gait training, Patient/Family education, Self Care, Joint mobilization, Stair training, Vestibular training, Canalith repositioning, Visual/preceptual remediation/compensation, DME instructions, Dry Needling, Spinal mobilization, Cryotherapy, Moist heat, Splintting, Taping, Manual therapy, and Re-evaluation.   PLAN FOR NEXT SESSION:   pain reduction, piriformis stretching, core strengthening    Auden Tatar  Leopoldo PT  Physical Therapist - [facility]  Outpatient Physical Therapy- Main Campus [phone]    10/10/23, 12:37 PM

## 2023-10-10 ENCOUNTER — Ambulatory Visit: Payer: Medicare Other

## 2023-10-10 DIAGNOSIS — M6281 Muscle weakness (generalized): Secondary | ICD-10-CM | POA: Diagnosis not present

## 2023-10-10 DIAGNOSIS — R2689 Other abnormalities of gait and mobility: Secondary | ICD-10-CM | POA: Diagnosis not present

## 2023-10-10 DIAGNOSIS — M5459 Other low back pain: Secondary | ICD-10-CM | POA: Diagnosis not present

## 2023-10-12 ENCOUNTER — Ambulatory Visit: Payer: Medicare Other | Admitting: Orthopedic Surgery

## 2023-10-12 ENCOUNTER — Encounter: Payer: Self-pay | Admitting: Orthopedic Surgery

## 2023-10-12 VITALS — BP 120/68 | Ht 62.0 in | Wt 132.4 lb

## 2023-10-12 DIAGNOSIS — M4726 Other spondylosis with radiculopathy, lumbar region: Secondary | ICD-10-CM

## 2023-10-12 DIAGNOSIS — M48061 Spinal stenosis, lumbar region without neurogenic claudication: Secondary | ICD-10-CM | POA: Diagnosis not present

## 2023-10-12 DIAGNOSIS — M5416 Radiculopathy, lumbar region: Secondary | ICD-10-CM

## 2023-10-12 DIAGNOSIS — Z940 Kidney transplant status: Secondary | ICD-10-CM

## 2023-10-12 DIAGNOSIS — M47816 Spondylosis without myelopathy or radiculopathy, lumbar region: Secondary | ICD-10-CM

## 2023-10-12 DIAGNOSIS — M4316 Spondylolisthesis, lumbar region: Secondary | ICD-10-CM | POA: Diagnosis not present

## 2023-10-12 NOTE — Patient Instructions (Signed)
 It was so nice to see you today. Thank you so much for coming in.    I am sorry that you are not feeling better.   You can stop the physical therapy since it is not helping.  You have wear and tear in your back (arthritis) along with spinal stenosis (pressure on the spinal cord) and these things are likely causing your pain.   I want to get a test called a CT myelogram for your lower back. Prior to a CT scan, the radiologist injects dye into your spine so that we can see thing better.   Since you only have 1 kidney, I want to make sure this test is okay with your kidney doctor. I will reach out to Dr. Leobardo and also discuss with the radiologist.   I will call you and let you know the plan after I talk to them. You should hear from me hopefully next week.   Once you have the test done, I will bring you back to see either Dr. Claudene or Dr. Clois to discuss any possible surgery options.   Please do not hesitate to call if you have any questions or concerns. You can also message me in MyChart.   Glade Boys PA-C [phone]     The physicians and staff at [facility] are committed to providing excellent care. You may receive a survey asking for feedback about your experience at our office. We value you your feedback and appreciate you taking the time to to fill it out. [facility] leadership team is also available to discuss your experience in person, feel free to contact us  [phone].

## 2023-10-15 ENCOUNTER — Ambulatory Visit: Payer: Medicare Other

## 2023-10-17 ENCOUNTER — Ambulatory Visit: Payer: Medicare Other

## 2023-10-19 DIAGNOSIS — Z940 Kidney transplant status: Secondary | ICD-10-CM | POA: Diagnosis not present

## 2023-10-22 ENCOUNTER — Ambulatory Visit: Payer: Medicare Other

## 2023-10-22 DIAGNOSIS — N3946 Mixed incontinence: Secondary | ICD-10-CM | POA: Diagnosis not present

## 2023-10-22 DIAGNOSIS — R351 Nocturia: Secondary | ICD-10-CM | POA: Diagnosis not present

## 2023-10-22 DIAGNOSIS — R8271 Bacteriuria: Secondary | ICD-10-CM | POA: Diagnosis not present

## 2023-10-22 DIAGNOSIS — N3000 Acute cystitis without hematuria: Secondary | ICD-10-CM | POA: Diagnosis not present

## 2023-10-22 NOTE — Addendum Note (Signed)
Addended byDrake Leach on: 10/22/2023 08:56 AM   Modules accepted: Orders

## 2023-10-23 ENCOUNTER — Ambulatory Visit
Admission: RE | Admit: 2023-10-23 | Discharge: 2023-10-23 | Disposition: A | Discharge status: Home / Self Care | Payer: Medicare Other | Source: Ambulatory Visit | Attending: Family Medicine | Admitting: Family Medicine

## 2023-10-23 DIAGNOSIS — Z1231 Encounter for screening mammogram for malignant neoplasm of breast: Secondary | ICD-10-CM | POA: Diagnosis not present

## 2023-10-24 ENCOUNTER — Ambulatory Visit: Payer: Medicare Other

## 2023-10-25 ENCOUNTER — Other Ambulatory Visit: Payer: Self-pay | Admitting: Student

## 2023-10-25 DIAGNOSIS — E1169 Type 2 diabetes mellitus with other specified complication: Secondary | ICD-10-CM

## 2023-10-26 NOTE — Progress Notes (Signed)
Patient for DG Lumbar Myelogram Inj/CT Lumbar Myelogram on Monday 10/29/2023, I called and spoke with the patient on the phone and gave pre-procedure instructions. Pt was made aware to be here at 9a and check in at the new entrance. Pt stated understanding.  Called 10/26/2023

## 2023-10-29 ENCOUNTER — Ambulatory Visit (HOSPITAL_BASED_OUTPATIENT_CLINIC_OR_DEPARTMENT_OTHER)
Admission: RE | Admit: 2023-10-29 | Discharge: 2023-10-29 | Disposition: A | Discharge status: Home / Self Care | Payer: Medicare Other | Source: Ambulatory Visit | Attending: Orthopedic Surgery | Admitting: Orthopedic Surgery

## 2023-10-29 ENCOUNTER — Ambulatory Visit: Payer: Medicare Other

## 2023-10-29 ENCOUNTER — Ambulatory Visit
Admission: RE | Admit: 2023-10-29 | Discharge: 2023-10-29 | Disposition: A | Discharge status: Home / Self Care | Payer: Medicare Other | Source: Ambulatory Visit | Attending: Orthopedic Surgery | Admitting: Orthopedic Surgery

## 2023-10-29 DIAGNOSIS — M5416 Radiculopathy, lumbar region: Secondary | ICD-10-CM

## 2023-10-29 DIAGNOSIS — M48061 Spinal stenosis, lumbar region without neurogenic claudication: Secondary | ICD-10-CM

## 2023-10-29 DIAGNOSIS — M4316 Spondylolisthesis, lumbar region: Secondary | ICD-10-CM

## 2023-10-29 DIAGNOSIS — M47816 Spondylosis without myelopathy or radiculopathy, lumbar region: Secondary | ICD-10-CM | POA: Diagnosis not present

## 2023-10-29 DIAGNOSIS — M5126 Other intervertebral disc displacement, lumbar region: Secondary | ICD-10-CM | POA: Insufficient documentation

## 2023-10-29 DIAGNOSIS — M51369 Other intervertebral disc degeneration, lumbar region without mention of lumbar back pain or lower extremity pain: Secondary | ICD-10-CM | POA: Diagnosis not present

## 2023-10-29 DIAGNOSIS — R531 Weakness: Secondary | ICD-10-CM | POA: Diagnosis not present

## 2023-10-29 DIAGNOSIS — M51379 Other intervertebral disc degeneration, lumbosacral region without mention of lumbar back pain or lower extremity pain: Secondary | ICD-10-CM | POA: Diagnosis not present

## 2023-10-29 DIAGNOSIS — M5134 Other intervertebral disc degeneration, thoracic region: Secondary | ICD-10-CM | POA: Diagnosis not present

## 2023-10-29 DIAGNOSIS — E1169 Type 2 diabetes mellitus with other specified complication: Secondary | ICD-10-CM

## 2023-10-29 MED ORDER — LIDOCAINE HCL (PF) 1 % IJ SOLN
5.0000 mL | Freq: Once | INTRAMUSCULAR | Status: AC
  Administered 2023-10-29: 5 mL
  Filled 2023-10-29: qty 5

## 2023-10-29 MED ORDER — SODIUM CHLORIDE 0.9 % IV SOLN: INTRAVENOUS | Status: DC

## 2023-10-29 MED ORDER — IOHEXOL 180 MG/ML  SOLN
20.0000 mL | Freq: Once | INTRAMUSCULAR | Status: AC | PRN
  Administered 2023-10-29: 20 mL

## 2023-10-29 MED ORDER — LIDOCAINE HCL (PF) 1 % IJ SOLN
5.0000 mL | Freq: Once | INTRAMUSCULAR | Status: AC
  Administered 2023-10-29: 2 mL
  Filled 2023-10-29: qty 5

## 2023-10-29 NOTE — Procedures (Signed)
L4/L5 myelogram performed. Please see full dictation under the imaging tab in Epic.  Alwyn Ren, AGACNP-BC 10/29/2023, 12:23 PM

## 2023-10-29 NOTE — OR Nursing (Signed)
Voided 75 ml dark yellow urine via purewik. But scant amount of urine around purewik. When cleaned up noted perineal area with breakdown. Removed purewik and advised patient to call to get out of bed to [facility] with assist. She reported she has been in bed since Friday. Only out of bed once for [facility] Saturday evening.

## 2023-10-31 ENCOUNTER — Ambulatory Visit: Payer: Medicare Other

## 2023-11-02 NOTE — Progress Notes (Unsigned)
Referring Physician:  Drake Leach, PA-C [address] [place],  [place] [address]  Primary Physician:  Eustaquio Boyden, MD  History of Present Illness: 11/05/2023 Kristen Kirk is here today with a chief complaint of back pain that worsens when standing.  Her back pain has been present for multiple years but continues to worsen.  She has had conservative management including injections with physical medicine and rehabilitation as well as a comprehensive physical therapy regimen.  She has not had significant relief with her injections nor her physical therapy.  Given her history of a sacral stimulator she has had a recent CT myelogram and was referred to me by Drake Leach who has been following her in clinic.  She does get back pain that radiates into her left leg predominantly.  This starts in her mid back and radiates down the posterior aspect of her thigh sometimes crossing her knee.  She gets numbness and tingling when she walks or stands for a prolonged period it gets better when she sits down's and leans forward.  Medically she does have a history of a kidney transplant, so has been limited to Tylenol and no NSAID use.  She does not smoke.  She has a history of osteoporosis but has been treated with Fosamax.  The symptoms are causing a significant impact on the patient's life.   I have utilized the care everywhere function in epic to review the outside records available from external health systems.  Review of Systems:  A 10 point review of systems is negative, except for the pertinent positives and negatives detailed in the HPI.  Past Medical History: Past Medical History:  Diagnosis Date   Anemia in chronic kidney disease    a. s/p aranesp 11/2013 now starting epo with HD 06/2014   Diabetes mellitus type II    a. Currently diet controlled since losing weight.   DJD (degenerative joint disease)    Essential hypertension    GERD (gastroesophageal reflux  disease)    Glaucoma, both eyes    a. L>R   History of echocardiogram    a. 01/2015 Echo [facility]): EF ?55%, triv MR, mildly dil LA, nl PV.   History of end stage renal disease    previous on HD;   received kidney transplant 07/ 2018 (on dialysis since 2015 MWF)  followed by [facility] renal (Dr. Linus Salmons) considering transplant Access: LUE fistula Establishing with [place] [facility]   History of stress test    a. 03/2013 Myoview [facility]): EF 63%, no ischemia.   History of syncope    a. 12/2015.   History of Urge Incontinence    HLD (hyperlipidemia)    Osteoarthritis    Osteoporosis 05/03/2019   DEXA 04/2019 - 1.5 spine, -2.1 hip, -2.5 forearm Check phosphate and PTH to guide further management in h/o kidney transplant   S/P kidney transplant 04/2017   @[facility] ---   per pt one kidney from living-donor   Vaginal atrophy    a. vagifem caused spotting, normal TVS   Vitamin D deficiency    Wears glasses    stated on 05/31/2022   Wears partial dentures    upper 2 teeth. 05/31/2022    Past Surgical History: Past Surgical History:  Procedure Laterality Date   ANAL RECTAL MANOMETRY N/A 03/16/2021   Procedure: ANO RECTAL MANOMETRY;  Surgeon: Napoleon Form, MD;  Location: [facility];  Service: Endoscopy;  Laterality: N/A;   BLADDER SUSPENSION  2011   for  stress incontinence-Dr. MacDiarmid   BREAST BIOPSY Right 12/15/2020   fat necrosis   CATARACT EXTRACTION W/ INTRAOCULAR LENS IMPLANT Bilateral    left 02-25-2021;  right ?   COLONOSCOPY  03/2019   diverticulosis, rpt 10 yrs (Nandigam)   KIDNEY TRANSPLANT Right 04/03/2017   [facility])   left eye laser surgery  06/2011   LIGATION OF ARTERIOVENOUS  FISTULA  10/2017   thrombosed L arm AFV   NEPHRECTOMY TRANSPLANTED ORGAN     REFRACTIVE SURGERY     TUBAL LIGATION Bilateral    1990s    Allergies: Allergies as of 11/05/2023 - Review Complete 11/05/2023  Allergen Reaction Noted   Brimonidine  03/02/2014   Shellfish-derived products  Swelling 03/02/2014   Tramadol Other (See Comments) 01/05/2020    Medications:  Current Outpatient Medications:    acetaminophen (TYLENOL) 500 MG tablet, Take 1 tablet (500 mg total) by mouth 3 (three) times daily as needed for moderate pain., Disp: , Rfl:    alendronate (FOSAMAX) 70 MG tablet, Take 1 tablet (70 mg total) by mouth every 7 (seven) days. Take with a full glass of water on an empty stomach., Disp: 12 tablet, Rfl: 4   apraclonidine (IOPIDINE) 0.5 % ophthalmic solution, Place 1 drop into the right eye daily., Disp: , Rfl:    aspirin 81 MG tablet, Take 81 mg by mouth daily., Disp: , Rfl:    atropine 1 % ophthalmic solution, Place 1 drop into the left eye 2 (two) times daily., Disp: , Rfl:    Blood Glucose Monitoring Suppl (ONE TOUCH ULTRA SYSTEM KIT) W/DEVICE KIT, 1 kit by Does not apply route once., Disp: 1 each, Rfl: 0   carvedilol (COREG) 25 MG tablet, Take 12.5 mg by mouth 2 (two) times daily with a meal., Disp: , Rfl:    Cholecalciferol (VITAMIN D3) 1000 units CAPS, Take 2 capsules (2,000 Units total) by mouth daily., Disp: 60 capsule, Rfl:    docusate sodium (COLACE) 100 MG capsule, Take 100 mg by mouth 2 (two) times daily as needed for mild constipation or moderate constipation., Disp: , Rfl: 0   dorzolamide-timolol (COSOPT) 2-0.5 % ophthalmic solution, Apply to eye., Disp: , Rfl:    glucose blood test strip, Check sugars once daily, Disp: 100 each, Rfl: 11   hydrochlorothiazide (HYDRODIURIL) 12.5 MG tablet, Take 1 tablet (12.5 mg total) by mouth daily., Disp: 90 tablet, Rfl: 4   ketorolac (ACULAR) 0.5 % ophthalmic solution, Place 1 drop into the left eye 4 (four) times daily., Disp: , Rfl:    latanoprost (XALATAN) 0.005 % ophthalmic solution, Place 1 drop into the right eye at bedtime., Disp: , Rfl:    Melatonin 3 MG CAPS, Take 1-2 capsules (3-6 mg total) by mouth at bedtime as needed (sleep)., Disp: , Rfl: 0   Multiple Vitamin (MULTIVITAMIN) tablet, Take 1 tablet by mouth  daily., Disp: , Rfl:    mycophenolate (CELLCEPT) 500 MG tablet, Take 500 mg by mouth 2 (two) times daily., Disp: , Rfl:    NEOMYCIN-POLYMYXIN-HYDROCORTISONE (CORTISPORIN) 1 % SOLN OTIC solution, Apply 1-2 drops to toe BID after soaking, Disp: 10 mL, Rfl: 1   pantoprazole (PROTONIX) 20 MG tablet, Take 1 tablet (20 mg total) by mouth daily., Disp: 90 tablet, Rfl: 4   pravastatin (PRAVACHOL) 80 MG tablet, Take 1 tablet (80 mg total) by mouth every evening., Disp: 90 tablet, Rfl: 4   prednisoLONE acetate (PRED FORTE) 1 % ophthalmic suspension, Place 1 drop into the left eye every  2 (two) hours while awake., Disp: , Rfl:    tacrolimus (PROGRAF) 0.5 MG capsule, Take 0.5-1 mg by mouth See admin instructions. Take 2 capsules (1mg ) by mouth every morning and take 1 capsule (0.5mg ) by mouth every night, Disp: , Rfl:    tiZANidine (ZANAFLEX) 2 MG tablet, Take 1 tablet (2 mg total) by mouth 3 times/day as needed-between meals & bedtime for muscle spasms., Disp: 10 tablet, Rfl: 0  Social History: Social History   Tobacco Use   Smoking status: Former    Current packs/day: 0.00    Types: Cigarettes    Quit date: 1987    Years since quitting: 38.1   Smokeless tobacco: Never   Tobacco comments:    Remote- quit ~ late 1980's.  Vaping Use   Vaping status: Never Used  Substance Use Topics   Alcohol use: No    Alcohol/week: 0.0 standard drinks of alcohol   Drug use: No    Family Medical History: Family History  Problem Relation Age of Onset   Other Father        she did not know her father.   Diabetes Mother    Heart failure Mother        died @ 31   Stroke Brother    Hyperlipidemia Brother    Hypertension Brother    Hyperlipidemia Brother    Hypertension Brother    Hyperlipidemia Sister    Hypertension Sister    Hyperlipidemia Sister    Hypertension Sister    Heart failure Maternal Grandfather    Breast cancer Neg Hx    Colon cancer Neg Hx    Esophageal cancer Neg Hx    Stomach cancer  Neg Hx    Rectal cancer Neg Hx     Physical Examination: Vitals:   11/05/23 1000  BP: 130/78    General: Patient is in no apparent distress. Attention to examination is appropriate.  Neck:   Supple.  Full range of motion.  Respiratory: Patient is breathing without any difficulty.   NEUROLOGICAL:     Awake, alert, oriented to person, place, and time.  Speech is clear and fluent.   Cranial Nerves: Pupils equal round and reactive to light.  Facial tone is symmetric.  Facial sensation is symmetric. Shoulder shrug is symmetric. Tongue protrusion is midline.    Strength:  Side Iliopsoas Quads Hamstring PF DF EHL  R 5 5 5 5 5 5   L 5 5 5 5 5 5    Reflexes are 1+ and symmetric at the biceps, triceps, brachioradialis, patella and achilles.   Hoffman's is absent. Clonus is absent  Bilateral upper and lower extremity sensation is intact to light touch .     No evidence of dysmetria noted.  Is antalgic with forward carriage.  Imaging:   Narrative & Impression  CLINICAL DATA:  Spinal stenosis.  Back pain and leg weakness.   EXAM: CT LUMBAR SPINE WITH CONTRAST   TECHNIQUE: Multidetector CT imaging of the lumbar spine was performed with intravenous contrast administration.   RADIATION DOSE REDUCTION: This exam was performed according to the departmental dose-optimization program which includes automated exposure control, adjustment of the mA and/or kV according to patient size and/or use of iterative reconstruction technique.   COMPARISON:  Myelogram study same day   FINDINGS: Primarily subdural injection. The study remains sufficiently diagnostic.   Segmentation: 5 lumbar type vertebral bodies.   Alignment: 6-7 mm degenerative anterolisthesis L4-5.   Vertebrae: No fracture or focal bone lesion.  Paraspinal and other soft tissues: Atrophic native kidneys. Renal transplant right iliac fossa.   Disc levels: Mild non-compressive disc bulges at T11-12, T12-L1  and L1-2.   L2-3: Moderate disc bulge. Mild facet and ligamentous hypertrophy. No compressive canal stenosis. Bilateral foraminal narrowing could affect either L2 nerve.   L3-4: Moderate disc bulge. Facet and ligamentous hypertrophy. Mild multifactorial stenosis of the canal. Mild bilateral foraminal stenosis.   L4-5: Chronic facet arthropathy with degenerative anterolisthesis of 6-7 mm. No disc herniation. Severe stenosis of the central canal and both neural foramina, likely to be symptomatic.   L5-S1: Disc degeneration and vacuum phenomenon. Endplate osteophytes and bulging of the disc. Left posterolateral disc herniation probably focally affecting the left S1 nerve. Bilateral facet degeneration and hypertrophy. Bilateral foraminal stenosis could compress either exiting L5 nerve.   IMPRESSION: 1. Primarily subdural injection. 2. L4-5: Chronic facet arthropathy with degenerative anterolisthesis of 6-7 mm. Severe stenosis of the central canal and both neural foramina, likely to be symptomatic. 3. L5-S1: Disc degeneration and vacuum phenomenon. Endplate osteophytes and bulging of the disc. Left posterolateral disc herniation probably focally affecting the left S1 nerve. Bilateral foraminal stenosis could compress either exiting L5 nerve. 4. L2-3: Moderate disc bulge. Mild facet and ligamentous hypertrophy. Bilateral foraminal narrowing could affect either L2 nerve. 5. L3-4: Moderate disc bulge. Facet and ligamentous hypertrophy. Mild multifactorial stenosis of the canal. Mild bilateral foraminal stenosis.     Electronically Signed   By: Paulina Fusi M.D.   On: 10/29/2023 11:29      Of also reviewed her outside x-rays which demonstrated a grade 2 spondylolisthesis that has some evidence of change on flexion extension at L4-5.  She also has collapsed disc space that tell 5 S1 and foraminal changes at L3-L4.  I have personally reviewed the images and agree with the above  interpretation.  Medical Decision Making/Assessment and Plan: Ms. Hrivnak is a pleasant 73 y.o. female with history of chronic back pain with radiating into her lower extremities.  Her symptoms are consistent with lumbar radiculopathy, neurogenic stenosis and claudication, and mechanical back pain.  She worsens when she is standing and improves with recumbency.  She has had significant physical therapy as well as injection management with physical medicine and rehabilitation.  She has been dealing with these issues for multiple years and feels that she is exhausting her conservative management.  She does have a history of osteoporosis and for that would like to repeat her DEXA scan to evaluate for any improvement in her bone quality in the setting of Fosamax treatment.  Once she undergoes that we will be able to discuss any plans going forward from a surgical standpoint.  She does have diffuse spondylosis throughout her entire thoracolumbar spine, however her grade 2 spondylolisthesis at L4-5 as well as her disc collapse and bilateral foraminal stenosis at L5-S1 seem to be the worst.  She does have some foraminal disease above.  Given that her back pain is worse than her leg pain, if her bone density is quality we would consider a lumbar fusion from L4-S1 or from L3-S1 with a posterior approach and interbody fusions at corresponding levels.  This will give [place] access to her left-sided facets and foramina to help directly and into directly decompress the exiting nerve roots.  We discussed the risks and benefits of surgery as well as the natural history of her disease.  We discussed the major nature of this procedure and that we like to utilize this as  a last resort.  She has done her conservative management including injections and physical therapy.   Thank you for involving me in the care of this patient.    Lovenia Kim MD/MSCR Neurosurgery

## 2023-11-05 ENCOUNTER — Ambulatory Visit: Payer: Medicare Other | Admitting: Neurosurgery

## 2023-11-05 ENCOUNTER — Encounter: Payer: Self-pay | Admitting: Neurosurgery

## 2023-11-05 VITALS — BP 130/78 | Ht 62.0 in | Wt 130.0 lb

## 2023-11-05 DIAGNOSIS — M48062 Spinal stenosis, lumbar region with neurogenic claudication: Secondary | ICD-10-CM

## 2023-11-05 DIAGNOSIS — M810 Age-related osteoporosis without current pathological fracture: Secondary | ICD-10-CM

## 2023-11-05 DIAGNOSIS — M4316 Spondylolisthesis, lumbar region: Secondary | ICD-10-CM | POA: Insufficient documentation

## 2023-11-05 DIAGNOSIS — M48061 Spinal stenosis, lumbar region without neurogenic claudication: Secondary | ICD-10-CM | POA: Insufficient documentation

## 2023-11-05 DIAGNOSIS — M5416 Radiculopathy, lumbar region: Secondary | ICD-10-CM | POA: Diagnosis not present

## 2023-11-07 ENCOUNTER — Ambulatory Visit: Payer: Medicare Other

## 2023-11-12 ENCOUNTER — Ambulatory Visit: Payer: Medicare Other

## 2023-11-12 DIAGNOSIS — H401123 Primary open-angle glaucoma, left eye, severe stage: Secondary | ICD-10-CM | POA: Diagnosis not present

## 2023-11-12 DIAGNOSIS — H401133 Primary open-angle glaucoma, bilateral, severe stage: Secondary | ICD-10-CM | POA: Diagnosis not present

## 2023-11-12 DIAGNOSIS — E113292 Type 2 diabetes mellitus with mild nonproliferative diabetic retinopathy without macular edema, left eye: Secondary | ICD-10-CM | POA: Diagnosis not present

## 2023-11-13 ENCOUNTER — Ambulatory Visit
Admission: RE | Admit: 2023-11-13 | Discharge: 2023-11-13 | Disposition: A | Discharge status: Home / Self Care | Payer: Medicare Other | Source: Ambulatory Visit | Attending: Neurosurgery | Admitting: Neurosurgery

## 2023-11-13 DIAGNOSIS — M48062 Spinal stenosis, lumbar region with neurogenic claudication: Secondary | ICD-10-CM | POA: Diagnosis not present

## 2023-11-13 DIAGNOSIS — Z780 Asymptomatic menopausal state: Secondary | ICD-10-CM | POA: Diagnosis not present

## 2023-11-13 DIAGNOSIS — M810 Age-related osteoporosis without current pathological fracture: Secondary | ICD-10-CM | POA: Insufficient documentation

## 2023-11-13 DIAGNOSIS — M4316 Spondylolisthesis, lumbar region: Secondary | ICD-10-CM | POA: Diagnosis not present

## 2023-11-13 DIAGNOSIS — M5416 Radiculopathy, lumbar region: Secondary | ICD-10-CM | POA: Diagnosis not present

## 2023-11-14 ENCOUNTER — Ambulatory Visit: Payer: Medicare Other

## 2023-11-19 ENCOUNTER — Ambulatory Visit: Payer: Medicare Other

## 2023-11-19 ENCOUNTER — Ambulatory Visit: Payer: Medicare Other | Admitting: Neurosurgery

## 2023-11-19 DIAGNOSIS — M48062 Spinal stenosis, lumbar region with neurogenic claudication: Secondary | ICD-10-CM

## 2023-11-19 DIAGNOSIS — M4316 Spondylolisthesis, lumbar region: Secondary | ICD-10-CM | POA: Diagnosis not present

## 2023-11-19 NOTE — Progress Notes (Signed)
 I had a follow-up phone visit with Kristen Kirk.  She was at home I was in the office.  We discussed her back pain and spondylolisthesis.  We did discuss that the results of her testing show that she would be a candidate for a L4-S1 spinal fusion.  Given that most of her leg symptoms are in the left lower extremity we did approach this from the left.  We discussed the risks and benefits of surgery.  She like to go forward with the procedure.  Will plan on coordinating her schedule to find a time that works for her.  All of her questions were answered.  Total of 10 minutes were spent on the phone visit.

## 2023-11-21 ENCOUNTER — Ambulatory Visit: Payer: Medicare Other

## 2023-11-21 ENCOUNTER — Ambulatory Visit: Payer: Medicare Other | Admitting: Family Medicine

## 2023-11-26 ENCOUNTER — Telehealth: Payer: Self-pay | Admitting: Neurosurgery

## 2023-11-26 ENCOUNTER — Ambulatory Visit: Payer: Medicare Other

## 2023-11-26 DIAGNOSIS — M4316 Spondylolisthesis, lumbar region: Secondary | ICD-10-CM

## 2023-11-26 DIAGNOSIS — M48062 Spinal stenosis, lumbar region with neurogenic claudication: Secondary | ICD-10-CM

## 2023-11-26 DIAGNOSIS — M5416 Radiculopathy, lumbar region: Secondary | ICD-10-CM

## 2023-11-26 NOTE — Telephone Encounter (Signed)
 Patient states she spoke to Dr.Smith and he offered her surgery. Someone was supposed to call her with a surgery date and no one has called her yet.

## 2023-11-27 ENCOUNTER — Ambulatory Visit (INDEPENDENT_AMBULATORY_CARE_PROVIDER_SITE_OTHER): Payer: Medicare Other | Admitting: Family Medicine

## 2023-11-27 ENCOUNTER — Encounter: Payer: Self-pay | Admitting: Family Medicine

## 2023-11-27 VITALS — BP 130/70 | HR 64 | Temp 97.6°F | Ht 62.0 in | Wt 134.4 lb

## 2023-11-27 DIAGNOSIS — N186 End stage renal disease: Secondary | ICD-10-CM | POA: Diagnosis not present

## 2023-11-27 DIAGNOSIS — M5442 Lumbago with sciatica, left side: Secondary | ICD-10-CM

## 2023-11-27 DIAGNOSIS — E1169 Type 2 diabetes mellitus with other specified complication: Secondary | ICD-10-CM

## 2023-11-27 DIAGNOSIS — G8929 Other chronic pain: Secondary | ICD-10-CM

## 2023-11-27 DIAGNOSIS — M48061 Spinal stenosis, lumbar region without neurogenic claudication: Secondary | ICD-10-CM

## 2023-11-27 DIAGNOSIS — Z940 Kidney transplant status: Secondary | ICD-10-CM | POA: Diagnosis not present

## 2023-11-27 DIAGNOSIS — D849 Immunodeficiency, unspecified: Secondary | ICD-10-CM

## 2023-11-27 LAB — POCT GLYCOSYLATED HEMOGLOBIN (HGB A1C): Hemoglobin A1C: 7 % — AB (ref 4.0–5.6)

## 2023-11-27 NOTE — Assessment & Plan Note (Signed)
 Discussing neurosurgical intervention with Dr Katrinka Blazing

## 2023-11-27 NOTE — Patient Instructions (Addendum)
 We will refer you for diabetes classes.  Work on low sugar low carb diabetic diet. When you do eat carbs, choose complex carbs like whole grain bread, whole wheat pasta, brown rice, sweet potatoes, and watch the portion sizes.  Limit added sugars/sweetened beverages in diet  Drink plenty of water.  Return in 3-4 months for diabetes follow up visit

## 2023-11-27 NOTE — Assessment & Plan Note (Signed)
 Continues cellcept/program through transplant team.

## 2023-11-27 NOTE — Assessment & Plan Note (Addendum)
 Chronic, improved based on A1c but still above ideal.  Reviewed diet choices to improve cholesterol levels. She wants to focus on diet for next 3-4 months off antihyperglycemic medication.  She does agree to diabetes education referral.  RTC 3-4 mo DM f/u visit, low threshold to start antihyperglycemic agent if persistently above goal (likely sulfonylurea vs DPP4i januvia in kidney transplant history).  Diabetes self care and diabetes and nutrition handouts provided today

## 2023-11-27 NOTE — Assessment & Plan Note (Signed)
 Sees PM&R and now neurosurgery as per below.

## 2023-11-27 NOTE — Progress Notes (Signed)
 Ph: [phone] Fax: [phone]   Patient ID: Kristen Kirk, female    DOB: November 17, 1950, 73 y.o.   MRN: [id number]  This visit was conducted in person.  BP 130/70   Pulse 64   Temp 97.6 F (36.4 C) (Oral)   Ht 5\' 2"  (1.575 m)   Wt 134 lb 6 oz (61 kg)   LMP  (LMP Unknown)   SpO2 98%   BMI 24.58 kg/m    CC: 3 mo DM f/u visit  Subjective:   HPI: Kristen Kirk is a 74 y.o. female presenting on 11/27/2023 for Medical Management of Chronic Issues (Here for 3 mo DM f/u.)   S/p kidney transplant 04/2017 on prograf and mycophenolate.  Recent labs for upcoming appt tomorrow with kidney transplant team Dr Gwynneth Munson - started on keflex bid 10d course today for possible UTI.   Chronic low back pain with radiation down L>R leg, no numbness or weakness of legs - saw Dr Katrinka Blazing NSG planned back surgery pending clearance - may need a couple surgeries. 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 Worse with first steps after long sitting or laying down. Regularly using cane due to this.   DM - does regularly check sugars but didn't bring in. Compliant with antihyperglycemic regimen which includes: diet control. Denies low sugars or hypoglycemic symptoms. Denies paresthesias. + blurry vision. Last diabetic eye exam 07/2023. Glucometer brand: one-touch ultra meter. Last foot exam: done today. DSME: offered, declined. Lab Results  Component Value Date   HGBA1C 7.0 (A) 11/27/2023   Diabetic Foot Exam - Simple   Simple Foot Form Diabetic Foot exam was performed with the following findings: Yes 11/27/2023  3:34 PM  Visual Inspection No deformities, no ulcerations,  no other skin breakdown bilaterally: Yes Sensation Testing Intact to touch and monofilament testing bilaterally: Yes Pulse Check Posterior Tibialis and Dorsalis pulse intact bilaterally: Yes Comments No claudication    Lab Results  Component Value Date   MICROALBUR 7.6 (H) 08/14/2023    No chest pain, dyspnea, dizziness, palpitations. No signs of infection like diarrhea, skin cellulitis.  Occ cough. UTI symptoms managing with keflex 10d course started today through transplant team.     Relevant past medical, surgical, family and social history reviewed and updated as indicated. Interim medical history since our last visit reviewed. Allergies and medications reviewed and updated. Outpatient Medications Prior to Visit  Medication Sig Dispense Refill   acetaminophen (TYLENOL) 500 MG tablet Take 1 tablet (500 mg total) by mouth 3 (three) times daily as needed for moderate pain.     alendronate (FOSAMAX) 70 MG tablet Take 1 tablet (70 mg total) by mouth every 7 (seven) days. Take with a full glass of water on an empty stomach. 12 tablet 4   amLODipine (NORVASC) 2.5 MG tablet Take 2.5 mg by mouth daily.     apraclonidine (IOPIDINE) 0.5 % ophthalmic solution Place 1 drop into the right eye daily.     aspirin 81 MG tablet Take 81 mg by mouth daily.     atropine 1 % ophthalmic solution Place 1 drop into the left eye 2 (two) times daily.     Blood Glucose Monitoring Suppl (ONE TOUCH ULTRA SYSTEM  KIT) W/DEVICE KIT 1 kit by Does not apply route once. 1 each 0   carvedilol (COREG) 25 MG tablet Take 12.5 mg by mouth 2 (two) times daily with a meal.     Cholecalciferol (VITAMIN D3) 1000 units CAPS Take 2 capsules (2,000 Units total) by mouth daily. 60 capsule    docusate sodium (COLACE) 100 MG capsule Take 100 mg by mouth 2 (two) times daily as needed for mild constipation or moderate constipation.  0   dorzolamide-timolol (COSOPT) 2-0.5 % ophthalmic solution Apply to eye.     glucose blood test  strip Check sugars once daily 100 each 11   hydrochlorothiazide (HYDRODIURIL) 12.5 MG tablet Take 1 tablet (12.5 mg total) by mouth daily. 90 tablet 4   ketorolac (ACULAR) 0.5 % ophthalmic solution Place 1 drop into the left eye 4 (four) times daily.     latanoprost (XALATAN) 0.005 % ophthalmic solution Place 1 drop into the right eye at bedtime.     Melatonin 3 MG CAPS Take 1-2 capsules (3-6 mg total) by mouth at bedtime as needed (sleep).  0   Multiple Vitamin (MULTIVITAMIN) tablet Take 1 tablet by mouth daily.     mycophenolate (CELLCEPT) 500 MG tablet Take 500 mg by mouth 2 (two) times daily.     NEOMYCIN-POLYMYXIN-HYDROCORTISONE (CORTISPORIN) 1 % SOLN OTIC solution Apply 1-2 drops to toe BID after soaking 10 mL 1   pantoprazole (PROTONIX) 20 MG tablet Take 1 tablet (20 mg total) by mouth daily. 90 tablet 4   pravastatin (PRAVACHOL) 80 MG tablet Take 1 tablet (80 mg total) by mouth every evening. 90 tablet 4   prednisoLONE acetate (PRED FORTE) 1 % ophthalmic suspension Place 1 drop into the left eye every 2 (two) hours while awake.     tacrolimus (PROGRAF) 0.5 MG capsule Take 0.5-1 mg by mouth See admin instructions. Take 2 capsules (1mg ) by mouth every morning and take 1 capsule (0.5mg ) by mouth every night     tiZANidine (ZANAFLEX) 2 MG tablet Take 1 tablet (2 mg total) by mouth 3 times/day as needed-between meals & bedtime for muscle spasms. 10 tablet 0   No facility-administered medications prior to visit.     Per HPI unless specifically indicated in ROS section below Review of Systems  Objective:  BP 130/70   Pulse 64   Temp 97.6 F (36.4 C) (Oral)   Ht 5\' 2"  (1.575 m)   Wt 134 lb 6 oz (61 kg)   LMP  (LMP Unknown)   SpO2 98%   BMI 24.58 kg/m   Wt Readings from Last 3 Encounters:  11/27/23 134 lb 6 oz (61 kg)  11/05/23 130 lb (59 kg)  10/29/23 130 lb (59 kg)      Physical Exam Vitals and nursing note reviewed.  Constitutional:      Appearance: Normal appearance. She is  not ill-appearing.  HENT:     Mouth/Throat:     Mouth: Mucous membranes are moist.     Pharynx: Oropharynx is clear. No oropharyngeal exudate or posterior oropharyngeal erythema.  Eyes:     Extraocular Movements: Extraocular movements intact.     Conjunctiva/sclera: Conjunctivae normal.     Pupils: Pupils are equal, round, and reactive to light.  Cardiovascular:     Rate and Rhythm: Normal rate and regular rhythm.     Pulses: Normal pulses.     Heart sounds: Normal heart sounds. No murmur heard. Pulmonary:     Effort: Pulmonary effort is normal. No  respiratory distress.     Breath sounds: Normal breath sounds. No wheezing, rhonchi or rales.  Musculoskeletal:     Cervical back: Normal range of motion and neck supple.     Right lower leg: No edema.     Left lower leg: No edema.  Skin:    General: Skin is warm and dry.     Findings: No rash.  Neurological:     Mental Status: She is alert.  Psychiatric:        Mood and Affect: Mood normal.        Behavior: Behavior normal.       Results for orders placed or performed in visit on 11/27/23  POCT glycosylated hemoglobin (Hb A1C)   Collection Time: 11/27/23  3:15 PM  Result Value Ref Range   Hemoglobin A1C 7.0 (A) 4.0 - 5.6 %   HbA1c POC (<> result, manual entry)     HbA1c, POC (prediabetic range)     HbA1c, POC (controlled diabetic range)     Lab Results  Component Value Date   NA 142 08/14/2023   CL 108 08/14/2023   K 4.0 08/14/2023   CO2 24 08/14/2023   BUN 27 (H) 08/14/2023   CREATININE 0.81 08/14/2023   GFR 73.27 08/14/2023   CALCIUM 10.0 08/14/2023   PHOS 5.6 (H) 03/28/2016   ALBUMIN 4.3 08/14/2023   GLUCOSE 141 (H) 08/14/2023    Lab Results  Component Value Date   WBC 6.4 02/23/2023   HGB 12.9 02/23/2023   HCT 40.9 02/23/2023   MCV 85.0 02/23/2023   PLT 208.0 02/23/2023     Assessment & Plan:   Problem List Items Addressed This Visit     History of renal transplant (Chronic)   Continues  cellcept/program through transplant team.       Relevant Orders   Ambulatory referral to diabetic education   Immunosuppressed status (HCC) (Chronic)   Continues cellcept/program through transplant team.       Relevant Orders   Ambulatory referral to diabetic education   Type 2 diabetes mellitus with other specified complication (HCC) - Primary   Chronic, improved based on A1c but still above ideal.  Reviewed diet choices to improve cholesterol levels. She wants to focus on diet for next 3-4 months off antihyperglycemic medication.  She does agree to diabetes education referral.  RTC 3-4 mo DM f/u visit, low threshold to start antihyperglycemic agent if persistently above goal (likely sulfonylurea vs DPP4i januvia in kidney transplant history).  Diabetes self care and diabetes and nutrition handouts provided today       Relevant Orders   POCT glycosylated hemoglobin (Hb A1C) (Completed)   Ambulatory referral to diabetic education   Chronic lower back pain   Sees PM&R and now neurosurgery as per below.       Spinal stenosis of lumbar region   Discussing neurosurgical intervention with Dr Katrinka Blazing        No orders of the defined types were placed in this encounter.   Orders Placed This Encounter  Procedures   Ambulatory referral to diabetic education    Referral Priority:   Routine    Referral Type:   Consultation    Referral Reason:   Specialty Services Required    Number of Visits Requested:   1   POCT glycosylated hemoglobin (Hb A1C)    Patient Instructions  We will refer you for diabetes classes.  Work on low sugar low carb diabetic diet. When you do eat carbs,  choose complex carbs like whole grain bread, whole wheat pasta, brown rice, sweet potatoes, and watch the portion sizes.  Limit added sugars/sweetened beverages in diet  Drink plenty of water.  Return in 3-4 months for diabetes follow up visit   Follow up plan: Return in about 4 months (around 03/26/2024) for  follow up visit.  Eustaquio Boyden, MD

## 2023-11-28 ENCOUNTER — Ambulatory Visit: Payer: Medicare Other

## 2023-11-28 DIAGNOSIS — D849 Immunodeficiency, unspecified: Secondary | ICD-10-CM | POA: Diagnosis not present

## 2023-11-28 DIAGNOSIS — I10 Essential (primary) hypertension: Secondary | ICD-10-CM | POA: Diagnosis not present

## 2023-11-28 DIAGNOSIS — Z940 Kidney transplant status: Secondary | ICD-10-CM | POA: Diagnosis not present

## 2023-11-28 DIAGNOSIS — E119 Type 2 diabetes mellitus without complications: Secondary | ICD-10-CM | POA: Diagnosis not present

## 2023-11-28 DIAGNOSIS — Z79899 Other long term (current) drug therapy: Secondary | ICD-10-CM | POA: Diagnosis not present

## 2023-11-29 DIAGNOSIS — N3946 Mixed incontinence: Secondary | ICD-10-CM | POA: Diagnosis not present

## 2023-11-29 DIAGNOSIS — N3000 Acute cystitis without hematuria: Secondary | ICD-10-CM | POA: Diagnosis not present

## 2023-11-30 NOTE — Telephone Encounter (Deleted)
 I spoke with [name] on 12/11/23. We discussed the following:   Planned surgery: L4-S1 maximum access transforaminal lumbar interbody fusion (MAS TLIF)   Surgery date: 12/25/23 at [facility] (Medical Mall: [address] [place], [place] [address]) - you will find out your arrival time the business day before your surgery.   Pre-op appointment at [facility] Pre-admit Testing: we will call you with a date/time for this. If you are scheduled for an in person appointment, Pre-admit Testing is located on the first floor of the [facility] building, [address], [address]. Please bring all prescriptions in the original prescription bottles to your appointment. During this appointment, they will advise you which medications you can take the morning of surgery, and which medications you will need to hold for surgery. Labs (such as blood work, EKG) may be done at your pre-op appointment. You are not required to fast for these labs. Should you need to change your pre-op appointment, please call Pre-admit testing at [phone].     Kidney transplant medications:  Cellcept: stay on 500mg  twice/day until surgery. Take 250mg  the morning of surgery, continue 250mg  twice/day until 2 week post op appointment (on 4/7). Resume cellcept 500mg  twice/day after 2 week post op appointment.  Tacrolimus: stay on regular dose, take the morning of surgery per Dr Kristen Kirk  -----------  Hydrochlorothiazide: hold the day of surgery per Dr Kristen Kirk  -----------  Blood thinners:   Aspirin 81mg :   because you stated you are taking aspirin 81mg  as a preventative, stop aspirin 7 days prior, resume aspirin 14 days after     Surgical clearance: we will send a clearance form to Dr Kristen Kirk (Nephrology). They may wish to see you in their office prior to signing the clearance form. If so, they may call you to schedule an appointment.     NSAIDS (Non-steroidal  anti-inflammatory drugs): because you are having a fusion, please avoid taking any NSAIDS (examples: ibuprofen, motrin, aleve, naproxen, meloxicam, diclofenac) for 3 months after surgery. Celebrex is an exception and is OK to take, if prescribed. Tylenol is not an NSAID.    Common restrictions after surgery: No bending, lifting, or twisting ("BLT"). Avoid lifting objects heavier than 10 pounds for the first 6 weeks after surgery. Where possible, avoid household activities that involve lifting, bending, reaching, pushing, or pulling such as laundry, vacuuming, grocery shopping, and childcare. Try to arrange for help from friends and family for these activities while you heal. Do not drive while taking prescription pain medication. Weeks 6 through 12 after surgery: avoid lifting more than 25 pounds.    X-rays after surgery: Because you are having a fusion: for appointments after your 2 week follow-up: please arrive at the [facility] outpatient imaging center ([address], [place]) or [facility] one hour prior to your appointment for x-rays. This applies to every appointment after your 2 week follow-up. Failure to do so may result in your appointment being rescheduled.   How to contact us:  If you have any questions/concerns before or after surgery, you can reach [place] at [phone], or you can send a mychart message. We can be reached by phone or mychart 8am-4pm, Monday-Friday.  *Please note: Calls after 4pm are forwarded to a third party answering service. Mychart messages are not routinely monitored during evenings, weekends, and holidays. Please call our office to contact the answering service for urgent concerns during non-business hours.   If  you have FMLA/disability paperwork, please drop it off or fax it to [phone], attention Kristen Kirk.   Appointments/FMLA & disability paperwork: Joycelyn Rua, & Flonnie Hailstone Registered Nurse/Surgery scheduler:  Royston Cowper Medical Assistants: Nash Mantis Physician Assistants: Synda Flores, PA-C, Manning Charity, PA-C & Drake Leach, PA-C Surgeons: Venetia Night, MD & Ernestine Mcmurray, MD

## 2023-11-30 NOTE — Telephone Encounter (Addendum)
 Attempted to reach patient on [phone] #. No answer. Mailbox full. Attempted to reach patient on [phone] #. No answer, no voicemail option.

## 2023-12-03 ENCOUNTER — Ambulatory Visit: Payer: Medicare Other

## 2023-12-05 ENCOUNTER — Ambulatory Visit: Payer: Medicare Other

## 2023-12-10 ENCOUNTER — Ambulatory Visit: Payer: Medicare Other

## 2023-12-10 DIAGNOSIS — Z940 Kidney transplant status: Secondary | ICD-10-CM | POA: Diagnosis not present

## 2023-12-10 DIAGNOSIS — Z4822 Encounter for aftercare following kidney transplant: Secondary | ICD-10-CM | POA: Diagnosis not present

## 2023-12-10 DIAGNOSIS — N281 Cyst of kidney, acquired: Secondary | ICD-10-CM | POA: Diagnosis not present

## 2023-12-10 DIAGNOSIS — I700 Atherosclerosis of aorta: Secondary | ICD-10-CM | POA: Diagnosis not present

## 2023-12-10 DIAGNOSIS — N270 Small kidney, unilateral: Secondary | ICD-10-CM | POA: Diagnosis not present

## 2023-12-10 NOTE — Telephone Encounter (Signed)
 Left voicemail for [facility] with [facility] requesting a call back for guidance from Dr Gwynneth Munson for cellcept and prograf for this patient to have surgery.

## 2023-12-10 NOTE — Telephone Encounter (Signed)
 I was able to reach Kristen Kirk and update her that as soon as we have clearance and guidance from her nephrologist, we will proceed with scheduling her surgery. She was appreciative of the call.

## 2023-12-11 ENCOUNTER — Other Ambulatory Visit: Payer: Self-pay

## 2023-12-11 DIAGNOSIS — Z01818 Encounter for other preprocedural examination: Secondary | ICD-10-CM

## 2023-12-11 NOTE — Telephone Encounter (Signed)
 I have placed a copy of the instructions in the mail for Kristen Kirk.

## 2023-12-11 NOTE — Telephone Encounter (Signed)
 I spoke with Kristen Kirk. We discussed the following:   Planned surgery: L4-S1 maximum access transforaminal lumbar interbody fusion (MAS TLIF)   Surgery date: 12/25/23 at [facility] (Medical Mall: [address] [place], [place] [address]) - you will find out your arrival time the business day before your surgery.   Pre-op appointment at [facility] Pre-admit Testing: we will call you with a date/time for this. If you are scheduled for an in person appointment, Pre-admit Testing is located on the first floor of the [facility] building, [address], [address]. Please bring all prescriptions in the original prescription bottles to your appointment. During this appointment, they will advise you which medications you can take the morning of surgery, and which medications you will need to hold for surgery. Labs (such as blood work, EKG) may be done at your pre-op appointment. You are not required to fast for these labs. Should you need to change your pre-op appointment, please call Pre-admit testing at [phone].     Kidney transplant medications:  Cellcept: stay on 500mg  twice/day until surgery. Take 250mg  the morning of surgery, continue 250mg  twice/day until 2 week post op appointment (on 4/7). Resume cellcept 500mg  twice/day after 2 week post op appointment.  Tacrolimus: stay on regular dose, take the morning of surgery per Kristen Kirk  -----------  Hydrochlorothiazide: hold the day of surgery per Kristen Kirk  -----------  Blood thinners:   Aspirin 81mg :   because you stated you are taking aspirin 81mg  as a preventative, stop aspirin 7 days prior, resume aspirin 14 days after     Surgical clearance: we will send a clearance form to Kristen Kirk (Nephrology). They may wish to see you in their office prior to signing the clearance form. If so, they may call you to schedule an appointment.     NSAIDS (Non-steroidal anti-inflammatory  drugs): because you are having a fusion, please avoid taking any NSAIDS (examples: ibuprofen, motrin, aleve, naproxen, meloxicam, diclofenac) for 3 months after surgery. Celebrex is an exception and is OK to take, if prescribed. Tylenol is not an NSAID.    Common restrictions after surgery: No bending, lifting, or twisting ("BLT"). Avoid lifting objects heavier than 10 pounds for the first 6 weeks after surgery. Where possible, avoid household activities that involve lifting, bending, reaching, pushing, or pulling such as laundry, vacuuming, grocery shopping, and childcare. Try to arrange for help from friends and family for these activities while you heal. Do not drive while taking prescription pain medication. Weeks 6 through 12 after surgery: avoid lifting more than 25 pounds.    X-rays after surgery: Because you are having a fusion: for appointments after your 2 week follow-up: please arrive at the [facility] outpatient imaging center ([address], [address], [place]) or [employer] [facility] one hour prior to your appointment for x-rays. This applies to every appointment after your 2 week follow-up. Failure to do so may result in your appointment being rescheduled.   How to contact us:  If you have any questions/concerns before or after surgery, you can reach [place] at [phone], or you can send a mychart message. We can be reached by phone or mychart 8am-4pm, Monday-Friday.  *Please note: Calls after 4pm are forwarded to a third party answering service. Mychart messages are not routinely monitored during evenings, weekends, and holidays. Please call our office to contact the answering service for urgent concerns during non-business hours.   If you have  FMLA/disability paperwork, please drop it off or fax it to [phone], attention Patty.   Appointments/FMLA & disability paperwork: Joycelyn Rua, & Flonnie Hailstone Registered Nurse/Surgery scheduler: Royston Cowper Medical Assistants:  Nash Mantis Physician Assistants: Tuwanda Flores, PA-C, Manning Charity, PA-C & Drake Leach, PA-C Surgeons: Venetia Night, MD & Ernestine Mcmurray, MD

## 2023-12-11 NOTE — Telephone Encounter (Addendum)
 I personally spoke with Dr Gwynneth Munson [phone])  For her tacrolimus: He recommends staying on tacrolimus at her regular dose and taking it the morning of surgery.  For her cellcept: he recommends staying on her 500mg  BID until surgery, taking half a dose (250mg ) the morning of surgery, and taking 250mg  BID until her 2 week post op appointment. At her 2 week post op appointment (when we verify that her incisions are healing appropriately), she can resume her 500mg  BID.  He also recommends she hold her hydrochlorothiazide the day of surgery.  I will fax a clearance form to his office for him to sign with the above information.

## 2023-12-12 ENCOUNTER — Ambulatory Visit: Payer: Medicare Other

## 2023-12-17 ENCOUNTER — Ambulatory Visit: Payer: Medicare Other

## 2023-12-18 ENCOUNTER — Other Ambulatory Visit: Payer: Self-pay

## 2023-12-18 ENCOUNTER — Encounter
Admission: RE | Admit: 2023-12-18 | Discharge: 2023-12-18 | Disposition: A | Discharge status: Home / Self Care | Source: Ambulatory Visit | Attending: Neurosurgery | Admitting: Neurosurgery

## 2023-12-18 VITALS — BP 123/76 | HR 64 | Temp 98.3°F | Resp 13 | Ht 62.0 in | Wt 132.0 lb

## 2023-12-18 DIAGNOSIS — E1121 Type 2 diabetes mellitus with diabetic nephropathy: Secondary | ICD-10-CM | POA: Insufficient documentation

## 2023-12-18 DIAGNOSIS — E1169 Type 2 diabetes mellitus with other specified complication: Secondary | ICD-10-CM | POA: Diagnosis not present

## 2023-12-18 DIAGNOSIS — I10 Essential (primary) hypertension: Secondary | ICD-10-CM | POA: Insufficient documentation

## 2023-12-18 DIAGNOSIS — R001 Bradycardia, unspecified: Secondary | ICD-10-CM | POA: Insufficient documentation

## 2023-12-18 DIAGNOSIS — R8290 Unspecified abnormal findings in urine: Secondary | ICD-10-CM | POA: Diagnosis not present

## 2023-12-18 DIAGNOSIS — Z01818 Encounter for other preprocedural examination: Secondary | ICD-10-CM | POA: Diagnosis not present

## 2023-12-18 DIAGNOSIS — Z01810 Encounter for preprocedural cardiovascular examination: Secondary | ICD-10-CM | POA: Diagnosis not present

## 2023-12-18 DIAGNOSIS — Z01812 Encounter for preprocedural laboratory examination: Secondary | ICD-10-CM

## 2023-12-18 DIAGNOSIS — E785 Hyperlipidemia, unspecified: Secondary | ICD-10-CM | POA: Diagnosis not present

## 2023-12-18 HISTORY — DX: Spondylolisthesis, lumbar region: M43.16

## 2023-12-18 HISTORY — DX: Low back pain, unspecified: M54.50

## 2023-12-18 HISTORY — DX: Full incontinence of feces: R15.9

## 2023-12-18 HISTORY — DX: Other chronic pain: G89.29

## 2023-12-18 HISTORY — DX: Mixed incontinence: N39.46

## 2023-12-18 HISTORY — DX: Immunodeficiency, unspecified: D84.9

## 2023-12-18 HISTORY — DX: Spinal stenosis, lumbar region without neurogenic claudication: M48.061

## 2023-12-18 HISTORY — DX: Primary open-angle glaucoma, bilateral, stage unspecified: H40.1130

## 2023-12-18 LAB — URINALYSIS, COMPLETE (UACMP) WITH MICROSCOPIC
Bacteria, UA: NONE SEEN
Bilirubin Urine: NEGATIVE
Glucose, UA: NEGATIVE mg/dL
Hgb urine dipstick: NEGATIVE
Ketones, ur: NEGATIVE mg/dL
Nitrite: NEGATIVE
Protein, ur: NEGATIVE mg/dL
Specific Gravity, Urine: 1.023 (ref 1.005–1.030)
pH: 5 (ref 5.0–8.0)

## 2023-12-18 LAB — BASIC METABOLIC PANEL
Anion gap: 7 (ref 5–15)
BUN: 36 mg/dL — ABNORMAL HIGH (ref 8–23)
CO2: 26 mmol/L (ref 22–32)
Calcium: 10.3 mg/dL (ref 8.9–10.3)
Chloride: 106 mmol/L (ref 98–111)
Creatinine, Ser: 0.93 mg/dL (ref 0.44–1.00)
GFR, Estimated: >60 mL/min (ref >60)
Glucose, Bld: 155 mg/dL — ABNORMAL HIGH (ref 70–99)
Potassium: 3.8 mmol/L (ref 3.5–5.1)
Sodium: 139 mmol/L (ref 135–145)

## 2023-12-18 LAB — SURGICAL PCR SCREEN
MRSA, PCR: NEGATIVE
Staphylococcus aureus: NEGATIVE

## 2023-12-18 NOTE — Patient Instructions (Addendum)
 Your procedure is scheduled on: 01-03-24 Thursday Report to the Registration Desk on the 1st floor of the [facility].Then proceed to the 2nd floor Surgery Desk To find out your arrival time, please call [phone] between 1PM - 3PM on: 01-02-24 Wednesday If your arrival time is 6:00 am, do not arrive before that time as the [facility] entrance doors do not open until 6:00 am.  REMEMBER: Instructions that are not followed completely may result in serious medical risk, up to and including death; or upon the discretion of your surgeon and anesthesiologist your surgery may need to be rescheduled.  Do not eat food after midnight the night before surgery.  No gum chewing or hard candies.  You may however, drink water up to 2 hours before you are scheduled to arrive for your surgery. Do not drink anything within 2 hours of your scheduled arrival time.  One week prior to surgery: starting March 28  Stop ANY OVER THE COUNTER supplements until after surgery. Stop Vitamin D, melatonin, multiple vitamins.   Per Dr. Lucienne Capers note:  We have instructed the patient to hold their blood thinner, transplant medications, and diuretic for surgery as listed below:  Blood thinners: Aspirin 81mg :  stop aspirin 7 days prior, resume aspirin 14 days after -----------  Kidney transplant medications (per Dr Gwynneth Munson (nephrologist):  Cellcept (per Dr Gwynneth Munson): stay on 500mg  twice/day until surgery. Take 250mg  the morning of surgery, continue 250mg  twice/day until 2 week post op appointment (on 4/7). Resume cellcept 500mg  twice/day after 2 week post op appointment.   Tacrolimus: stay on regular dose, take the morning of surgery per Dr Gwynneth Munson ----------- Hydrochlorothiazide: hold the day of surgery per Dr Gwynneth Munson    Continue taking all of your other prescription medications up until the day of surgery.  ON THE DAY OF SURGERY ONLY TAKE THESE MEDICATIONS WITH SIPS OF  WATER:  Amlodipine Carvedilol Mycophenolate (Cellcept) 250 mg Pantoprazole (Protonix) Tacrolimus (Prograf) apraclonidine (IOPIDINE) eye drops dorzolamide-timolol (COSOPT) eye drops prednisoLONE acetate (PRED FORTE) eye drops  Bring your remote control for your spinal cord stimulator on the day of surgery.  No Alcohol for 24 hours before or after surgery.  No Smoking including e-cigarettes for 24 hours before surgery.  No chewable tobacco products for at least 6 hours before surgery.  No nicotine patches on the day of surgery.  Do not use any "recreational" drugs for at least a week (preferably 2 weeks) before your surgery.  Please be advised that the combination of cocaine and anesthesia may have negative outcomes, up to and including death. If you test positive for cocaine, your surgery will be cancelled.  On the morning of surgery brush your teeth with toothpaste and water, you may rinse your mouth with mouthwash if you wish. Do not swallow any toothpaste or mouthwash.  Use CHG Soap as directed on instruction sheet.  Do not wear jewelry, make-up, hairpins, clips or nail polish.  For welded (permanent) jewelry: bracelets, anklets, waist bands, etc.  Please have this removed prior to surgery.  If it is not removed, there is a chance that hospital personnel will need to cut it off on the day of surgery.  Do not wear lotions, powders, or perfumes.   Do not shave body hair from the neck down 48 hours before surgery.  Contact lenses, hearing aids and dentures may not be worn into surgery.  Do not bring valuables to the hospital. [facility] is not responsible for any missing/lost belongings or  valuables.   Notify your doctor if there is any change in your medical condition (cold, fever, infection).  Wear comfortable clothing (specific to your surgery type) to the hospital.  After surgery, you can help prevent lung complications by doing breathing exercises.  Take deep breaths  and cough every 1-2 hours. Your doctor may order a device called an Incentive Spirometer to help you take deep breaths.  If you are being admitted to the hospital overnight, leave your suitcase in the car. After surgery it may be brought to your room.  In case of increased patient census, it may be necessary for you, the patient, to continue your postoperative care in the Same Day Surgery department.  If you are being discharged the day of surgery, you will not be allowed to drive home. You will need a responsible individual to drive you home and stay with you for 24 hours after surgery.   If you are taking public transportation, you will need to have a responsible individual with you.  Please call the Pre-admissions Testing Dept. at [phone] if you have any questions about these instructions.  Surgery Visitation Policy:  Patients having surgery or a procedure may have two visitors.  Children under the age of 1 must have an adult with them who is not the patient.  Temporary Visitor Restrictions Due to increasing cases of flu, RSV and COVID-19: Children ages 70 and under will not be able to visit patients in [facility] hospitals under most circumstances.  Inpatient Visitation:    Visiting hours are 7 a.m. to 8 p.m. Up to four visitors are allowed at one time in a patient room. The visitors may rotate out with other people during the day.  One visitor age 106 or older may stay with the patient overnight and must be in the room by 8 p.m.      Pre-operative 5 CHG Bath Instructions   You can play a key role in reducing the risk of infection after surgery. Your skin needs to be as free of germs as possible. You can reduce the number of germs on your skin by washing with CHG (chlorhexidine gluconate) soap before surgery. CHG is an antiseptic soap that kills germs and continues to kill germs even after washing.   DO NOT use if you have an allergy to chlorhexidine/CHG or  antibacterial soaps. If your skin becomes reddened or irritated, stop using the CHG and notify one of our RNs at [phone].   Please shower with the CHG soap starting 4 days before surgery using the following schedule:     Please keep in mind the following:  DO NOT shave, including legs and underarms, starting the day of your first shower.   You may shave your face at any point before/day of surgery.  Place clean sheets on your bed the day you start using CHG soap. Use a clean washcloth (not used since being washed) for each shower. DO NOT sleep with pets once you start using the CHG.   CHG Shower Instructions:  If you choose to wash your hair and private area, wash first with your normal shampoo/soap.  After you use shampoo/soap, rinse your hair and body thoroughly to remove shampoo/soap residue.  Turn the water OFF and apply about 3 tablespoons (45 ml) of CHG soap to a CLEAN washcloth.  Apply CHG soap ONLY FROM YOUR NECK DOWN TO YOUR TOES (washing for 3-5 minutes)  DO NOT use CHG soap on face, private areas, open  wounds, or sores.  Pay special attention to the area where your surgery is being performed.  If you are having back surgery, having someone wash your back for you may be helpful. Wait 2 minutes after CHG soap is applied, then you may rinse off the CHG soap.  Pat dry with a clean towel  Put on clean clothes/pajamas   If you choose to wear lotion, please use ONLY the CHG-compatible lotions on the back of this paper.     Additional instructions for the day of surgery: DO NOT APPLY any lotions, deodorants, cologne, or perfumes.   Put on clean/comfortable clothes.  Brush your teeth.  Ask your nurse before applying any prescription medications to the skin.      CHG Compatible Lotions   Aveeno Moisturizing lotion  Cetaphil Moisturizing Cream  Cetaphil Moisturizing Lotion  Clairol Herbal Essence Moisturizing Lotion, Dry Skin  Clairol Herbal Essence Moisturizing Lotion,  Extra Dry Skin  Clairol Herbal Essence Moisturizing Lotion, Normal Skin  Curel Age Defying Therapeutic Moisturizing Lotion with Alpha Hydroxy  Curel Extreme Care Body Lotion  Curel Soothing Hands Moisturizing Hand Lotion  Curel Therapeutic Moisturizing Cream, Fragrance-Free  Curel Therapeutic Moisturizing Lotion, Fragrance-Free  Curel Therapeutic Moisturizing Lotion, Original Formula  Eucerin Daily Replenishing Lotion  Eucerin Dry Skin Therapy Plus Alpha Hydroxy Crme  Eucerin Dry Skin Therapy Plus Alpha Hydroxy Lotion  Eucerin Original Crme  Eucerin Original Lotion  Eucerin Plus Crme Eucerin Plus Lotion  Eucerin TriLipid Replenishing Lotion  Keri Anti-Bacterial Hand Lotion  Keri Deep Conditioning Original Lotion Dry Skin Formula Softly Scented  Keri Deep Conditioning Original Lotion, Fragrance Free Sensitive Skin Formula  Keri Lotion Fast Absorbing Fragrance Free Sensitive Skin Formula  Keri Lotion Fast Absorbing Softly Scented Dry Skin Formula  Keri Original Lotion  Keri Skin Renewal Lotion Keri Silky Smooth Lotion  Keri Silky Smooth Sensitive Skin Lotion  Nivea Body Creamy Conditioning Oil  Nivea Body Extra Enriched Teacher, adult education Moisturizing Lotion Nivea Crme  Nivea Skin Firming Lotion  NutraDerm 30 Skin Lotion  NutraDerm Skin Lotion  NutraDerm Therapeutic Skin Cream  NutraDerm Therapeutic Skin Lotion  ProShield Protective Hand Cream  Provon moisturizing lotion

## 2023-12-19 ENCOUNTER — Ambulatory Visit: Payer: Medicare Other

## 2023-12-20 ENCOUNTER — Telehealth: Payer: Self-pay | Admitting: Urgent Care

## 2023-12-20 DIAGNOSIS — Z940 Kidney transplant status: Secondary | ICD-10-CM | POA: Diagnosis not present

## 2023-12-20 DIAGNOSIS — B952 Enterococcus as the cause of diseases classified elsewhere: Secondary | ICD-10-CM

## 2023-12-20 DIAGNOSIS — Z01812 Encounter for preprocedural laboratory examination: Secondary | ICD-10-CM

## 2023-12-20 DIAGNOSIS — B9620 Unspecified Escherichia coli [E. coli] as the cause of diseases classified elsewhere: Secondary | ICD-10-CM

## 2023-12-20 MED ORDER — SULFAMETHOXAZOLE-TRIMETHOPRIM 800-160 MG PO TABS: 1.0000 | ORAL_TABLET | Freq: Two times a day (BID) | ORAL | 0 refills | Status: DC

## 2023-12-20 NOTE — Progress Notes (Signed)
 [facility] Perioperative Services: Pre-Admission/Anesthesia Testing  Abnormal Lab Notification and Treatment Plan of Care   Date: 12/20/23  Name: Kristen Kirk MRN:   [id number]  Re: Abnormal labs noted during PAT appointment   Notified:  Provider Name Provider Role Notification Mode  Ernestine Mcmurray, MD Neurosurgery (Surgeon) Routed and/or faxed via [facility]   Abnormal Lab Value(s):   Lab Results  Component Value Date   COLORURINE YELLOW (A) 12/18/2023   APPEARANCEUR HAZY (A) 12/18/2023   LABSPEC 1.023 12/18/2023   PHURINE 5.0 12/18/2023   GLUCOSEU NEGATIVE 12/18/2023   HGBUR NEGATIVE 12/18/2023   BILIRUBINUR NEGATIVE 12/18/2023   KETONESUR NEGATIVE 12/18/2023   PROTEINUR NEGATIVE 12/18/2023   UROBILINOGEN 0.2 03/21/2023   NITRITE NEGATIVE 12/18/2023   LEUKOCYTESUR MODERATE (A) 12/18/2023   EPIU 6-10 12/18/2023   WBCU 21-50 12/18/2023   RBCU 0-5 12/18/2023   BACTERIA NONE SEEN 12/18/2023   CULT (A) 12/18/2023    50,000 COLONIES/mL ESCHERICHIA COLI 10,000 COLONIES/mL ENTEROCOCCUS FAECALIS SUSCEPTIBILITIES TO FOLLOW Performed at [facility], [address], [place], [place] [address]    Clinical Information and Notes:  Patient is scheduled for L4-S1 MAXIMUM ACCESS (MAS) TRANSFORAMINAL LUMBAR INTERBODY FUSION (TLIF), LEFT APPROACH  on 12/25/2023.    UA performed in PAT consistent with/concerning for infection.  No leukocytosis noted on CBC; WBC 6.4 Renal function: Estimated Creatinine Clearance: 43.9 mL/min (by C-G formula based on SCr of 0.93 mg/dL). Urine C&S added to assess for pathogenically significant growth.  Impression and Plan:  Kristen Kirk with a UA that was (+) for infection; reflex culture sent. Culture grew out both GNR (50K CFU/mL Escherichia coli) and GPC (10K CFU/mL Enterococcus faecalis) bacterial colony counts, with the GNR being more pathogenically significant. Contacted patient to discuss. Patient reporting that she  is experiencing suprapubic pain and mild dysuria.  She denies any nausea, vomiting, fever, or chills.  Patient is unable to determine whether or not she is experiencing any additional pain in her back beyond that for which she is having surgery for on 12/25/2023.   As previously mentioned, patient with surgery scheduled soon. In efforts to avoid delaying patient's procedure, or have her experience any potentially significant perioperative complications related to the aforementioned, I would like to proceed with empiric treatment for urinary tract infection.  Allergies reviewed. Culture report also reviewed to ensure culture appropriate coverage is being provided. Will treat with a 3 day course of SMZ-TMP DS. Patient encouraged to complete the entire course of antibiotics even if she begins to feel better. She was advised that if culture demonstrates resistance to the prescribed antibiotic, she will be contacted and advised of the need to change the antibiotic being used to treat her infection.   Meds ordered this encounter  Medications   sulfamethoxazole-trimethoprim (BACTRIM DS) 800-160 MG tablet    Sig: Take 1 tablet by mouth 2 (two) times daily. Increase water intake while taking this medication.    Dispense:  6 tablet    Refill:  0    Please contact the patient as soon as it is available for pickup. Rx is for preoperative UTI treatment and needs to be started ASAP.   Patient encouraged to increase her fluid intake as much as possible. Discussed that water is always best to flush the urinary tract. She was advised to avoid caffeine containing fluids until her infections clears, as caffeine can cause her to experience painful bladder spasms.   May use Tylenol as needed for  pain/fever should she experience these symptoms.   Patient instructed to call surgeon's office or PAT with any questions or concerns related to the above outlined course of treatment. Additionally, she was instructed to call if  she feels like she is getting worse overall while on treatment. Results and treatment plan of care forwarded to primary attending surgeon to make them aware.   Encounter Diagnoses  Name Primary?   Pre-operative laboratory examination Yes   E. coli UTI (urinary tract infection)    UTI (urinary tract infection) due to Enterococcus    Kristen Mulling, MSN, APRN, FNP-C, CEN [facility]  Perioperative Services Nurse Practitioner Phone: [phone] Fax: [phone] 12/20/23 12:41 PM  NOTE: This note has been prepared using Dragon dictation software. Despite my best ability to proofread, there is always the potential that unintentional transcriptional errors may still occur from this process.

## 2023-12-21 LAB — URINE CULTURE: Culture: 50000 — AB

## 2023-12-24 ENCOUNTER — Ambulatory Visit: Payer: Medicare Other

## 2023-12-24 ENCOUNTER — Telehealth: Payer: Self-pay

## 2023-12-24 NOTE — Telephone Encounter (Signed)
 Dr Katrinka Blazing notified the patient that her surgery (currently scheduled for 12/25/23) will need to be rescheduled to 01/03/24 because the 3D C-arm is currently malfunctioning and is being repaired. I have moved her post-op appointments accordingly.

## 2023-12-25 NOTE — Telephone Encounter (Signed)
 Patient is aware of new post op appts, she asked them to be mailed to her. She is aware she might have to have a telephone pre-op. I will call her if there are any changes. She confirmed.

## 2023-12-28 ENCOUNTER — Encounter
Admission: RE | Admit: 2023-12-28 | Discharge: 2023-12-28 | Disposition: A | Discharge status: Home / Self Care | Source: Ambulatory Visit | Attending: Neurosurgery | Admitting: Neurosurgery

## 2023-12-28 DIAGNOSIS — Z01818 Encounter for other preprocedural examination: Secondary | ICD-10-CM

## 2023-12-31 ENCOUNTER — Encounter
Admission: RE | Admit: 2023-12-31 | Discharge: 2023-12-31 | Disposition: A | Discharge status: Home / Self Care | Source: Ambulatory Visit | Attending: Neurosurgery | Admitting: Neurosurgery

## 2023-12-31 DIAGNOSIS — M62838 Other muscle spasm: Secondary | ICD-10-CM | POA: Diagnosis not present

## 2023-12-31 DIAGNOSIS — R269 Unspecified abnormalities of gait and mobility: Secondary | ICD-10-CM | POA: Diagnosis not present

## 2023-12-31 DIAGNOSIS — Z91013 Allergy to seafood: Secondary | ICD-10-CM | POA: Diagnosis not present

## 2023-12-31 DIAGNOSIS — N1831 Chronic kidney disease, stage 3a: Secondary | ICD-10-CM | POA: Insufficient documentation

## 2023-12-31 DIAGNOSIS — Z8249 Family history of ischemic heart disease and other diseases of the circulatory system: Secondary | ICD-10-CM | POA: Diagnosis not present

## 2023-12-31 DIAGNOSIS — I129 Hypertensive chronic kidney disease with stage 1 through stage 4 chronic kidney disease, or unspecified chronic kidney disease: Secondary | ICD-10-CM | POA: Diagnosis not present

## 2023-12-31 DIAGNOSIS — E785 Hyperlipidemia, unspecified: Secondary | ICD-10-CM | POA: Diagnosis not present

## 2023-12-31 DIAGNOSIS — D631 Anemia in chronic kidney disease: Secondary | ICD-10-CM | POA: Insufficient documentation

## 2023-12-31 DIAGNOSIS — Z01812 Encounter for preprocedural laboratory examination: Secondary | ICD-10-CM | POA: Insufficient documentation

## 2023-12-31 DIAGNOSIS — Z940 Kidney transplant status: Secondary | ICD-10-CM | POA: Insufficient documentation

## 2023-12-31 DIAGNOSIS — K5900 Constipation, unspecified: Secondary | ICD-10-CM | POA: Diagnosis not present

## 2023-12-31 DIAGNOSIS — Z79620 Long term (current) use of immunosuppressive biologic: Secondary | ICD-10-CM | POA: Diagnosis not present

## 2023-12-31 DIAGNOSIS — I10 Essential (primary) hypertension: Secondary | ICD-10-CM | POA: Diagnosis not present

## 2023-12-31 DIAGNOSIS — K219 Gastro-esophageal reflux disease without esophagitis: Secondary | ICD-10-CM | POA: Diagnosis not present

## 2023-12-31 DIAGNOSIS — Z9842 Cataract extraction status, left eye: Secondary | ICD-10-CM | POA: Diagnosis not present

## 2023-12-31 DIAGNOSIS — K2100 Gastro-esophageal reflux disease with esophagitis, without bleeding: Secondary | ICD-10-CM | POA: Diagnosis not present

## 2023-12-31 DIAGNOSIS — R42 Dizziness and giddiness: Secondary | ICD-10-CM | POA: Diagnosis not present

## 2023-12-31 DIAGNOSIS — Z833 Family history of diabetes mellitus: Secondary | ICD-10-CM | POA: Diagnosis not present

## 2023-12-31 DIAGNOSIS — D649 Anemia, unspecified: Secondary | ICD-10-CM | POA: Diagnosis not present

## 2023-12-31 DIAGNOSIS — R1311 Dysphagia, oral phase: Secondary | ICD-10-CM | POA: Diagnosis not present

## 2023-12-31 DIAGNOSIS — Z9841 Cataract extraction status, right eye: Secondary | ICD-10-CM | POA: Diagnosis not present

## 2023-12-31 DIAGNOSIS — Z741 Need for assistance with personal care: Secondary | ICD-10-CM | POA: Diagnosis not present

## 2023-12-31 DIAGNOSIS — M6281 Muscle weakness (generalized): Secondary | ICD-10-CM | POA: Diagnosis not present

## 2023-12-31 DIAGNOSIS — M47816 Spondylosis without myelopathy or radiculopathy, lumbar region: Secondary | ICD-10-CM | POA: Diagnosis not present

## 2023-12-31 DIAGNOSIS — E782 Mixed hyperlipidemia: Secondary | ICD-10-CM | POA: Diagnosis not present

## 2023-12-31 DIAGNOSIS — M4316 Spondylolisthesis, lumbar region: Secondary | ICD-10-CM | POA: Diagnosis not present

## 2023-12-31 DIAGNOSIS — G3184 Mild cognitive impairment, so stated: Secondary | ICD-10-CM | POA: Diagnosis not present

## 2023-12-31 DIAGNOSIS — M5416 Radiculopathy, lumbar region: Secondary | ICD-10-CM | POA: Diagnosis not present

## 2023-12-31 DIAGNOSIS — M4807 Spinal stenosis, lumbosacral region: Secondary | ICD-10-CM | POA: Diagnosis not present

## 2023-12-31 DIAGNOSIS — M810 Age-related osteoporosis without current pathological fracture: Secondary | ICD-10-CM | POA: Diagnosis not present

## 2023-12-31 DIAGNOSIS — Z4789 Encounter for other orthopedic aftercare: Secondary | ICD-10-CM | POA: Diagnosis not present

## 2023-12-31 DIAGNOSIS — R41841 Cognitive communication deficit: Secondary | ICD-10-CM | POA: Diagnosis not present

## 2023-12-31 DIAGNOSIS — M48061 Spinal stenosis, lumbar region without neurogenic claudication: Secondary | ICD-10-CM | POA: Diagnosis not present

## 2023-12-31 DIAGNOSIS — Z01818 Encounter for other preprocedural examination: Secondary | ICD-10-CM

## 2023-12-31 DIAGNOSIS — M48062 Spinal stenosis, lumbar region with neurogenic claudication: Secondary | ICD-10-CM | POA: Diagnosis not present

## 2023-12-31 DIAGNOSIS — H4020X0 Unspecified primary angle-closure glaucoma, stage unspecified: Secondary | ICD-10-CM | POA: Diagnosis not present

## 2023-12-31 DIAGNOSIS — Z83438 Family history of other disorder of lipoprotein metabolism and other lipidemia: Secondary | ICD-10-CM | POA: Diagnosis not present

## 2023-12-31 DIAGNOSIS — Z981 Arthrodesis status: Secondary | ICD-10-CM | POA: Diagnosis not present

## 2023-12-31 DIAGNOSIS — G8929 Other chronic pain: Secondary | ICD-10-CM | POA: Diagnosis not present

## 2023-12-31 DIAGNOSIS — M549 Dorsalgia, unspecified: Secondary | ICD-10-CM | POA: Diagnosis not present

## 2023-12-31 DIAGNOSIS — R262 Difficulty in walking, not elsewhere classified: Secondary | ICD-10-CM | POA: Diagnosis not present

## 2023-12-31 DIAGNOSIS — M545 Low back pain, unspecified: Secondary | ICD-10-CM | POA: Diagnosis not present

## 2023-12-31 DIAGNOSIS — M1990 Unspecified osteoarthritis, unspecified site: Secondary | ICD-10-CM | POA: Diagnosis not present

## 2023-12-31 DIAGNOSIS — E119 Type 2 diabetes mellitus without complications: Secondary | ICD-10-CM | POA: Diagnosis not present

## 2023-12-31 DIAGNOSIS — Z87891 Personal history of nicotine dependence: Secondary | ICD-10-CM | POA: Diagnosis not present

## 2023-12-31 DIAGNOSIS — Z961 Presence of intraocular lens: Secondary | ICD-10-CM | POA: Diagnosis not present

## 2023-12-31 DIAGNOSIS — Z888 Allergy status to other drugs, medicaments and biological substances status: Secondary | ICD-10-CM | POA: Diagnosis not present

## 2023-12-31 DIAGNOSIS — Z885 Allergy status to narcotic agent status: Secondary | ICD-10-CM | POA: Diagnosis not present

## 2023-12-31 LAB — TYPE AND SCREEN
ABO/RH(D): AB POS
Antibody Screen: NEGATIVE

## 2024-01-01 LAB — TYPE AND SCREEN
ABO/RH(D): AB POS
Antibody Screen: NEGATIVE

## 2024-01-03 ENCOUNTER — Inpatient Hospital Stay: Admitting: Certified Registered"

## 2024-01-03 ENCOUNTER — Other Ambulatory Visit: Payer: Self-pay

## 2024-01-03 ENCOUNTER — Encounter
Admission: RE | Disposition: A | Discharge status: Skilled Nursing Facility | Payer: Self-pay | Source: Home / Self Care | Attending: Neurosurgery

## 2024-01-03 ENCOUNTER — Inpatient Hospital Stay
Admission: RE | Admit: 2024-01-03 | Discharge: 2024-01-09 | DRG: 427 | Disposition: A | Discharge status: Skilled Nursing Facility | Attending: Neurosurgery | Admitting: Neurosurgery

## 2024-01-03 ENCOUNTER — Inpatient Hospital Stay: Payer: Self-pay | Admitting: Urgent Care

## 2024-01-03 ENCOUNTER — Encounter: Payer: Self-pay | Admitting: Neurosurgery

## 2024-01-03 ENCOUNTER — Inpatient Hospital Stay

## 2024-01-03 DIAGNOSIS — Z833 Family history of diabetes mellitus: Secondary | ICD-10-CM

## 2024-01-03 DIAGNOSIS — M4316 Spondylolisthesis, lumbar region: Principal | ICD-10-CM | POA: Diagnosis present

## 2024-01-03 DIAGNOSIS — Z888 Allergy status to other drugs, medicaments and biological substances status: Secondary | ICD-10-CM | POA: Diagnosis not present

## 2024-01-03 DIAGNOSIS — Z87891 Personal history of nicotine dependence: Secondary | ICD-10-CM

## 2024-01-03 DIAGNOSIS — Z940 Kidney transplant status: Secondary | ICD-10-CM | POA: Diagnosis not present

## 2024-01-03 DIAGNOSIS — M549 Dorsalgia, unspecified: Secondary | ICD-10-CM | POA: Diagnosis present

## 2024-01-03 DIAGNOSIS — Z83438 Family history of other disorder of lipoprotein metabolism and other lipidemia: Secondary | ICD-10-CM

## 2024-01-03 DIAGNOSIS — Z885 Allergy status to narcotic agent status: Secondary | ICD-10-CM

## 2024-01-03 DIAGNOSIS — G8929 Other chronic pain: Secondary | ICD-10-CM | POA: Diagnosis present

## 2024-01-03 DIAGNOSIS — Z9841 Cataract extraction status, right eye: Secondary | ICD-10-CM | POA: Diagnosis not present

## 2024-01-03 DIAGNOSIS — E119 Type 2 diabetes mellitus without complications: Secondary | ICD-10-CM | POA: Diagnosis present

## 2024-01-03 DIAGNOSIS — E785 Hyperlipidemia, unspecified: Secondary | ICD-10-CM | POA: Diagnosis present

## 2024-01-03 DIAGNOSIS — M5416 Radiculopathy, lumbar region: Secondary | ICD-10-CM | POA: Diagnosis present

## 2024-01-03 DIAGNOSIS — M48062 Spinal stenosis, lumbar region with neurogenic claudication: Secondary | ICD-10-CM | POA: Diagnosis not present

## 2024-01-03 DIAGNOSIS — Z8249 Family history of ischemic heart disease and other diseases of the circulatory system: Secondary | ICD-10-CM

## 2024-01-03 DIAGNOSIS — M545 Low back pain, unspecified: Secondary | ICD-10-CM | POA: Diagnosis present

## 2024-01-03 DIAGNOSIS — M1990 Unspecified osteoarthritis, unspecified site: Secondary | ICD-10-CM | POA: Diagnosis present

## 2024-01-03 DIAGNOSIS — E1121 Type 2 diabetes mellitus with diabetic nephropathy: Secondary | ICD-10-CM

## 2024-01-03 DIAGNOSIS — Z91013 Allergy to seafood: Secondary | ICD-10-CM | POA: Diagnosis not present

## 2024-01-03 DIAGNOSIS — Z981 Arthrodesis status: Secondary | ICD-10-CM | POA: Diagnosis not present

## 2024-01-03 DIAGNOSIS — M810 Age-related osteoporosis without current pathological fracture: Secondary | ICD-10-CM | POA: Diagnosis present

## 2024-01-03 DIAGNOSIS — Z9842 Cataract extraction status, left eye: Secondary | ICD-10-CM

## 2024-01-03 DIAGNOSIS — Z961 Presence of intraocular lens: Secondary | ICD-10-CM | POA: Diagnosis present

## 2024-01-03 DIAGNOSIS — M48061 Spinal stenosis, lumbar region without neurogenic claudication: Principal | ICD-10-CM | POA: Diagnosis present

## 2024-01-03 DIAGNOSIS — K219 Gastro-esophageal reflux disease without esophagitis: Secondary | ICD-10-CM | POA: Diagnosis present

## 2024-01-03 DIAGNOSIS — I10 Essential (primary) hypertension: Secondary | ICD-10-CM | POA: Diagnosis present

## 2024-01-03 DIAGNOSIS — M4807 Spinal stenosis, lumbosacral region: Secondary | ICD-10-CM | POA: Diagnosis present

## 2024-01-03 DIAGNOSIS — Z01818 Encounter for other preprocedural examination: Secondary | ICD-10-CM

## 2024-01-03 DIAGNOSIS — Z01812 Encounter for preprocedural laboratory examination: Secondary | ICD-10-CM

## 2024-01-03 HISTORY — PX: TRANSFORAMINAL LUMBAR INTERBODY FUSION W/ MIS 2 LEVEL: SHX6146

## 2024-01-03 HISTORY — PX: APPLICATION OF INTRAOPERATIVE CT SCAN: SHX6668

## 2024-01-03 LAB — GLUCOSE, CAPILLARY
Glucose-Capillary: 144 mg/dL — ABNORMAL HIGH (ref 70–99)
Glucose-Capillary: 162 mg/dL — ABNORMAL HIGH (ref 70–99)
Glucose-Capillary: 169 mg/dL — ABNORMAL HIGH (ref 70–99)

## 2024-01-03 SURGERY — MINIMALLY INVASIVE (MIS) TRANSFORAMINAL LUMBAR INTERBODY FUSION (TLIF) 2 LEVEL
Anesthesia: General | Site: Back

## 2024-01-03 MED ORDER — HYDROCHLOROTHIAZIDE 12.5 MG PO TABS
12.5000 mg | ORAL_TABLET | Freq: Every day | ORAL | Status: DC
  Administered 2024-01-04 – 2024-01-09 (×6): 12.5 mg via ORAL
  Filled 2024-01-03 (×6): qty 1

## 2024-01-03 MED ORDER — MIDAZOLAM HCL 2 MG/2ML IJ SOLN
INTRAMUSCULAR | Status: AC
Start: 2024-01-03 — End: ?
  Filled 2024-01-03: qty 2

## 2024-01-03 MED ORDER — ONDANSETRON HCL 4 MG PO TABS: 4.0000 mg | ORAL_TABLET | Freq: Four times a day (QID) | ORAL | Status: DC | PRN

## 2024-01-03 MED ORDER — ONDANSETRON HCL 4 MG/2ML IJ SOLN
INTRAMUSCULAR | Status: AC
  Filled 2024-01-03: qty 2

## 2024-01-03 MED ORDER — BUPIVACAINE LIPOSOME 1.3 % IJ SUSP
INTRAMUSCULAR | Status: AC
  Filled 2024-01-03: qty 20

## 2024-01-03 MED ORDER — MIDAZOLAM HCL 2 MG/2ML IJ SOLN
INTRAMUSCULAR | Status: DC | PRN
  Administered 2024-01-03: 2 mg via INTRAVENOUS

## 2024-01-03 MED ORDER — DEXAMETHASONE SODIUM PHOSPHATE 10 MG/ML IJ SOLN
INTRAMUSCULAR | Status: DC | PRN
  Administered 2024-01-03: 10 mg via INTRAVENOUS

## 2024-01-03 MED ORDER — OXYCODONE HCL 5 MG PO TABS
10.0000 mg | ORAL_TABLET | ORAL | Status: DC | PRN
  Administered 2024-01-03 – 2024-01-04 (×3): 10 mg via ORAL
  Filled 2024-01-03 (×2): qty 2

## 2024-01-03 MED ORDER — DOCUSATE SODIUM 100 MG PO CAPS
100.0000 mg | ORAL_CAPSULE | Freq: Two times a day (BID) | ORAL | Status: DC
  Administered 2024-01-03 – 2024-01-09 (×7): 100 mg via ORAL
  Filled 2024-01-03 (×9): qty 1

## 2024-01-03 MED ORDER — HYDROMORPHONE HCL 1 MG/ML IJ SOLN
INTRAMUSCULAR | Status: AC
  Filled 2024-01-03: qty 1

## 2024-01-03 MED ORDER — SODIUM CHLORIDE 0.9 % IV SOLN: INTRAVENOUS | Status: AC

## 2024-01-03 MED ORDER — APRACLONIDINE HCL 0.5 % OP SOLN
1.0000 [drp] | Freq: Two times a day (BID) | OPHTHALMIC | Status: DC
  Administered 2024-01-04 – 2024-01-09 (×11): 1 [drp] via OPHTHALMIC
  Filled 2024-01-03: qty 5

## 2024-01-03 MED ORDER — MAGNESIUM CITRATE PO SOLN
1.0000 | Freq: Once | ORAL | Status: AC | PRN
  Administered 2024-01-05: 1 via ORAL
  Filled 2024-01-03: qty 296

## 2024-01-03 MED ORDER — REMIFENTANIL HCL 1 MG IV SOLR
INTRAVENOUS | Status: AC
  Filled 2024-01-03: qty 1000

## 2024-01-03 MED ORDER — SENNA 8.6 MG PO TABS
ORAL_TABLET | ORAL | Status: AC
  Filled 2024-01-03: qty 1

## 2024-01-03 MED ORDER — SODIUM CHLORIDE (PF) 0.9 % IJ SOLN
INTRAMUSCULAR | Status: AC
  Filled 2024-01-03: qty 10

## 2024-01-03 MED ORDER — GLYCOPYRROLATE 0.2 MG/ML IJ SOLN
INTRAMUSCULAR | Status: DC | PRN
  Administered 2024-01-03: .2 mg via INTRAVENOUS

## 2024-01-03 MED ORDER — CARVEDILOL 12.5 MG PO TABS
12.5000 mg | ORAL_TABLET | Freq: Two times a day (BID) | ORAL | Status: DC
  Administered 2024-01-04 – 2024-01-09 (×10): 12.5 mg via ORAL
  Filled 2024-01-03 (×9): qty 1

## 2024-01-03 MED ORDER — BISACODYL 5 MG PO TBEC: 5.0000 mg | DELAYED_RELEASE_TABLET | Freq: Every day | ORAL | Status: DC | PRN

## 2024-01-03 MED ORDER — SODIUM CHLORIDE (PF) 0.9 % IJ SOLN
INTRAMUSCULAR | Status: AC
  Filled 2024-01-03: qty 20

## 2024-01-03 MED ORDER — LIDOCAINE HCL (CARDIAC) PF 100 MG/5ML IV SOSY
PREFILLED_SYRINGE | INTRAVENOUS | Status: DC | PRN
  Administered 2024-01-03: 100 mg via INTRAVENOUS

## 2024-01-03 MED ORDER — SODIUM CHLORIDE (PF) 0.9 % IJ SOLN
INTRAMUSCULAR | Status: DC | PRN
  Administered 2024-01-03: 45 mL

## 2024-01-03 MED ORDER — OXYCODONE HCL 5 MG/5ML PO SOLN: 5.0000 mg | Freq: Once | ORAL | Status: DC | PRN

## 2024-01-03 MED ORDER — MELATONIN 5 MG PO TABS: 2.5000 mg | ORAL_TABLET | Freq: Every evening | ORAL | Status: DC | PRN

## 2024-01-03 MED ORDER — ONDANSETRON HCL 4 MG/2ML IJ SOLN
INTRAMUSCULAR | Status: DC | PRN
  Administered 2024-01-03: 4 mg via INTRAVENOUS

## 2024-01-03 MED ORDER — TACROLIMUS 1 MG PO CAPS
1.0000 mg | ORAL_CAPSULE | Freq: Every day | ORAL | Status: DC
  Administered 2024-01-04 – 2024-01-09 (×6): 1 mg via ORAL
  Filled 2024-01-03 (×6): qty 1

## 2024-01-03 MED ORDER — ACETAMINOPHEN 10 MG/ML IV SOLN
INTRAVENOUS | Status: AC
  Filled 2024-01-03: qty 100

## 2024-01-03 MED ORDER — POLYVINYL ALCOHOL 1.4 % OP SOLN: 1.0000 [drp] | OPHTHALMIC | Status: DC | PRN

## 2024-01-03 MED ORDER — ACETAMINOPHEN 650 MG RE SUPP: 650.0000 mg | RECTAL | Status: DC | PRN

## 2024-01-03 MED ORDER — SEVOFLURANE IN SOLN
RESPIRATORY_TRACT | Status: AC
  Filled 2024-01-03: qty 250

## 2024-01-03 MED ORDER — IRRISEPT - 450ML BOTTLE WITH 0.05% CHG IN STERILE WATER, USP 99.95% OPTIME
TOPICAL | Status: DC | PRN
  Administered 2024-01-03: 450 mL

## 2024-01-03 MED ORDER — ENOXAPARIN SODIUM 40 MG/0.4ML IJ SOSY
40.0000 mg | PREFILLED_SYRINGE | INTRAMUSCULAR | Status: DC
  Administered 2024-01-04 – 2024-01-09 (×6): 40 mg via SUBCUTANEOUS
  Filled 2024-01-03 (×6): qty 0.4

## 2024-01-03 MED ORDER — SENNA 8.6 MG PO TABS
1.0000 | ORAL_TABLET | Freq: Two times a day (BID) | ORAL | Status: DC
  Administered 2024-01-03 – 2024-01-08 (×6): 8.6 mg via ORAL
  Filled 2024-01-03 (×9): qty 1

## 2024-01-03 MED ORDER — SODIUM CHLORIDE 0.9% FLUSH: 3.0000 mL | INTRAVENOUS | Status: DC | PRN

## 2024-01-03 MED ORDER — MYCOPHENOLATE MOFETIL 250 MG PO CAPS
500.0000 mg | ORAL_CAPSULE | Freq: Two times a day (BID) | ORAL | Status: DC
Start: 2024-01-03 — End: 2024-01-05
  Administered 2024-01-03 – 2024-01-04 (×2): 500 mg via ORAL
  Filled 2024-01-03 (×4): qty 2

## 2024-01-03 MED ORDER — SUCCINYLCHOLINE CHLORIDE 200 MG/10ML IV SOSY
PREFILLED_SYRINGE | INTRAVENOUS | Status: AC
  Filled 2024-01-03: qty 10

## 2024-01-03 MED ORDER — PHENYLEPHRINE HCL-NACL 20-0.9 MG/250ML-% IV SOLN
INTRAVENOUS | Status: AC
  Filled 2024-01-03: qty 250

## 2024-01-03 MED ORDER — DOCUSATE SODIUM 100 MG PO CAPS
ORAL_CAPSULE | ORAL | Status: AC
  Filled 2024-01-03: qty 1

## 2024-01-03 MED ORDER — ALUM & MAG HYDROXIDE-SIMETH 200-200-20 MG/5ML PO SUSP: 30.0000 mL | Freq: Four times a day (QID) | ORAL | Status: DC | PRN

## 2024-01-03 MED ORDER — PROPOFOL 10 MG/ML IV BOLUS
INTRAVENOUS | Status: DC | PRN
  Administered 2024-01-03: 120 mg via INTRAVENOUS

## 2024-01-03 MED ORDER — DEXAMETHASONE SODIUM PHOSPHATE 10 MG/ML IJ SOLN
INTRAMUSCULAR | Status: AC
  Filled 2024-01-03: qty 1

## 2024-01-03 MED ORDER — OXYCODONE HCL 5 MG PO TABS: 5.0000 mg | ORAL_TABLET | Freq: Once | ORAL | Status: DC | PRN

## 2024-01-03 MED ORDER — OXYCODONE HCL 5 MG PO TABS
5.0000 mg | ORAL_TABLET | ORAL | Status: DC | PRN
  Administered 2024-01-05 – 2024-01-09 (×5): 5 mg via ORAL
  Filled 2024-01-03 (×6): qty 1

## 2024-01-03 MED ORDER — DROPERIDOL 2.5 MG/ML IJ SOLN
INTRAMUSCULAR | Status: AC
  Filled 2024-01-03: qty 2

## 2024-01-03 MED ORDER — METHOCARBAMOL 1000 MG/10ML IJ SOLN
INTRAMUSCULAR | Status: AC
  Filled 2024-01-03: qty 10

## 2024-01-03 MED ORDER — HYDROCHLOROTHIAZIDE 25 MG PO TABS
ORAL_TABLET | ORAL | Status: AC
  Filled 2024-01-03: qty 1

## 2024-01-03 MED ORDER — CEFAZOLIN SODIUM-DEXTROSE 2-4 GM/100ML-% IV SOLN
INTRAVENOUS | Status: AC
  Filled 2024-01-03: qty 100

## 2024-01-03 MED ORDER — TACROLIMUS 0.5 MG PO CAPS
0.5000 mg | ORAL_CAPSULE | Freq: Every day | ORAL | Status: DC
  Administered 2024-01-03 – 2024-01-08 (×6): 0.5 mg via ORAL
  Filled 2024-01-03 (×6): qty 1

## 2024-01-03 MED ORDER — OXYCODONE HCL 5 MG PO TABS
ORAL_TABLET | ORAL | Status: AC
  Filled 2024-01-03: qty 2

## 2024-01-03 MED ORDER — SENNOSIDES-DOCUSATE SODIUM 8.6-50 MG PO TABS: 1.0000 | ORAL_TABLET | Freq: Every evening | ORAL | Status: DC | PRN

## 2024-01-03 MED ORDER — CHLORHEXIDINE GLUCONATE 0.12 % MT SOLN
15.0000 mL | Freq: Once | OROMUCOSAL | Status: AC
  Administered 2024-01-03: 15 mL via OROMUCOSAL

## 2024-01-03 MED ORDER — PROPOFOL 1000 MG/100ML IV EMUL
INTRAVENOUS | Status: AC
  Filled 2024-01-03: qty 100

## 2024-01-03 MED ORDER — CEFAZOLIN SODIUM-DEXTROSE 2-4 GM/100ML-% IV SOLN
2.0000 g | Freq: Three times a day (TID) | INTRAVENOUS | Status: AC
  Administered 2024-01-03 – 2024-01-04 (×2): 2 g via INTRAVENOUS
  Filled 2024-01-03: qty 100

## 2024-01-03 MED ORDER — INSULIN ASPART 100 UNIT/ML IJ SOLN
0.0000 [IU] | Freq: Three times a day (TID) | INTRAMUSCULAR | Status: DC
  Administered 2024-01-04: 3 [IU] via SUBCUTANEOUS
  Administered 2024-01-04: 2 [IU] via SUBCUTANEOUS
  Administered 2024-01-04: 3 [IU] via SUBCUTANEOUS
  Administered 2024-01-05: 5 [IU] via SUBCUTANEOUS
  Administered 2024-01-05 – 2024-01-06 (×3): 3 [IU] via SUBCUTANEOUS
  Administered 2024-01-06: 2 [IU] via SUBCUTANEOUS
  Administered 2024-01-06: 5 [IU] via SUBCUTANEOUS
  Administered 2024-01-07: 3 [IU] via SUBCUTANEOUS
  Administered 2024-01-07 (×2): 2 [IU] via SUBCUTANEOUS
  Administered 2024-01-08: 5 [IU] via SUBCUTANEOUS
  Administered 2024-01-08 – 2024-01-09 (×3): 3 [IU] via SUBCUTANEOUS
  Filled 2024-01-03 (×16): qty 1

## 2024-01-03 MED ORDER — ACETAMINOPHEN 500 MG PO TABS
1000.0000 mg | ORAL_TABLET | Freq: Four times a day (QID) | ORAL | Status: AC
  Administered 2024-01-03 – 2024-01-04 (×4): 1000 mg via ORAL
  Filled 2024-01-03 (×3): qty 2

## 2024-01-03 MED ORDER — GABAPENTIN 300 MG PO CAPS
ORAL_CAPSULE | ORAL | Status: AC
  Filled 2024-01-03: qty 1

## 2024-01-03 MED ORDER — FENTANYL CITRATE (PF) 100 MCG/2ML IJ SOLN
25.0000 ug | INTRAMUSCULAR | Status: DC | PRN
  Administered 2024-01-03 (×2): 50 ug via INTRAVENOUS

## 2024-01-03 MED ORDER — CHLORHEXIDINE GLUCONATE 0.12 % MT SOLN
OROMUCOSAL | Status: AC
  Filled 2024-01-03: qty 15

## 2024-01-03 MED ORDER — VANCOMYCIN HCL IN DEXTROSE 1-5 GM/200ML-% IV SOLN
1000.0000 mg | Freq: Once | INTRAVENOUS | Status: AC
  Administered 2024-01-03: 1000 mg via INTRAVENOUS

## 2024-01-03 MED ORDER — DROPERIDOL 2.5 MG/ML IJ SOLN
0.6250 mg | Freq: Once | INTRAMUSCULAR | Status: AC | PRN
  Administered 2024-01-03: 0.625 mg via INTRAVENOUS

## 2024-01-03 MED ORDER — VITAMIN D 25 MCG (1000 UNIT) PO TABS
2000.0000 [IU] | ORAL_TABLET | Freq: Every day | ORAL | Status: DC
  Administered 2024-01-04 – 2024-01-09 (×6): 2000 [IU] via ORAL
  Filled 2024-01-03 (×6): qty 2

## 2024-01-03 MED ORDER — BUPIVACAINE-EPINEPHRINE (PF) 0.5% -1:200000 IJ SOLN
INTRAMUSCULAR | Status: DC | PRN
  Administered 2024-01-03: 10 mL

## 2024-01-03 MED ORDER — ZOLPIDEM TARTRATE 5 MG PO TABS: 5.0000 mg | ORAL_TABLET | Freq: Every evening | ORAL | Status: DC | PRN

## 2024-01-03 MED ORDER — PHENOL 1.4 % MT LIQD: 1.0000 | OROMUCOSAL | Status: DC | PRN

## 2024-01-03 MED ORDER — ACETAMINOPHEN 500 MG PO TABS
ORAL_TABLET | ORAL | Status: AC
  Filled 2024-01-03: qty 2

## 2024-01-03 MED ORDER — 0.9 % SODIUM CHLORIDE (POUR BTL) OPTIME
TOPICAL | Status: DC | PRN
  Administered 2024-01-03: 500 mL

## 2024-01-03 MED ORDER — VANCOMYCIN HCL IN DEXTROSE 1-5 GM/200ML-% IV SOLN
INTRAVENOUS | Status: AC
  Filled 2024-01-03: qty 200

## 2024-01-03 MED ORDER — ACETAMINOPHEN 10 MG/ML IV SOLN: 1000.0000 mg | Freq: Once | INTRAVENOUS | Status: DC | PRN

## 2024-01-03 MED ORDER — CEFAZOLIN SODIUM 1 G IJ SOLR
INTRAMUSCULAR | Status: AC
  Filled 2024-01-03: qty 20

## 2024-01-03 MED ORDER — LATANOPROST 0.005 % OP SOLN
1.0000 [drp] | Freq: Every day | OPHTHALMIC | Status: DC
  Administered 2024-01-03 – 2024-01-08 (×6): 1 [drp] via OPHTHALMIC
  Filled 2024-01-03 (×2): qty 2.5

## 2024-01-03 MED ORDER — PROPOFOL 10 MG/ML IV BOLUS
INTRAVENOUS | Status: AC
  Filled 2024-01-03: qty 20

## 2024-01-03 MED ORDER — METHOCARBAMOL 1000 MG/10ML IJ SOLN
500.0000 mg | Freq: Four times a day (QID) | INTRAMUSCULAR | Status: DC | PRN
  Administered 2024-01-03: 500 mg via INTRAVENOUS

## 2024-01-03 MED ORDER — PREDNISOLONE ACETATE 1 % OP SUSP
1.0000 [drp] | Freq: Four times a day (QID) | OPHTHALMIC | Status: DC
  Administered 2024-01-03 – 2024-01-09 (×22): 1 [drp] via OPHTHALMIC
  Filled 2024-01-03: qty 1

## 2024-01-03 MED ORDER — PRAVASTATIN SODIUM 40 MG PO TABS
ORAL_TABLET | ORAL | Status: AC
  Filled 2024-01-03: qty 2

## 2024-01-03 MED ORDER — PANTOPRAZOLE SODIUM 20 MG PO TBEC
20.0000 mg | DELAYED_RELEASE_TABLET | Freq: Every day | ORAL | Status: DC
  Administered 2024-01-03 – 2024-01-09 (×7): 20 mg via ORAL
  Filled 2024-01-03 (×7): qty 1

## 2024-01-03 MED ORDER — REMIFENTANIL HCL 1 MG IV SOLR
INTRAVENOUS | Status: DC | PRN
  Administered 2024-01-03: .08 ug/kg/min via INTRAVENOUS

## 2024-01-03 MED ORDER — AMLODIPINE BESYLATE 5 MG PO TABS
2.5000 mg | ORAL_TABLET | Freq: Every day | ORAL | Status: DC
  Administered 2024-01-04 – 2024-01-09 (×6): 2.5 mg via ORAL
  Filled 2024-01-03 (×5): qty 1

## 2024-01-03 MED ORDER — HYDROMORPHONE HCL 1 MG/ML IJ SOLN
0.5000 mg | INTRAMUSCULAR | Status: DC | PRN
  Administered 2024-01-03: 0.5 mg via INTRAVENOUS

## 2024-01-03 MED ORDER — METHOCARBAMOL 500 MG PO TABS
500.0000 mg | ORAL_TABLET | Freq: Four times a day (QID) | ORAL | Status: DC | PRN
  Administered 2024-01-04 – 2024-01-09 (×3): 500 mg via ORAL
  Filled 2024-01-03 (×3): qty 1

## 2024-01-03 MED ORDER — SUCCINYLCHOLINE CHLORIDE 200 MG/10ML IV SOSY
PREFILLED_SYRINGE | INTRAVENOUS | Status: DC | PRN
  Administered 2024-01-03: 80 mg via INTRAVENOUS

## 2024-01-03 MED ORDER — SURGIFLO WITH THROMBIN (HEMOSTATIC MATRIX KIT) OPTIME
TOPICAL | Status: DC | PRN
  Administered 2024-01-03: 1 via TOPICAL

## 2024-01-03 MED ORDER — SODIUM CHLORIDE 0.9 % IV SOLN: 250.0000 mL | INTRAVENOUS | Status: AC

## 2024-01-03 MED ORDER — FENTANYL CITRATE (PF) 100 MCG/2ML IJ SOLN
INTRAMUSCULAR | Status: AC
  Filled 2024-01-03: qty 2

## 2024-01-03 MED ORDER — FENTANYL CITRATE (PF) 100 MCG/2ML IJ SOLN
INTRAMUSCULAR | Status: DC | PRN
  Administered 2024-01-03 (×2): 50 ug via INTRAVENOUS

## 2024-01-03 MED ORDER — LIDOCAINE HCL (PF) 2 % IJ SOLN
INTRAMUSCULAR | Status: AC
  Filled 2024-01-03: qty 5

## 2024-01-03 MED ORDER — MENTHOL 3 MG MT LOZG: 1.0000 | LOZENGE | OROMUCOSAL | Status: DC | PRN

## 2024-01-03 MED ORDER — PANTOPRAZOLE SODIUM 40 MG PO TBEC
DELAYED_RELEASE_TABLET | ORAL | Status: AC
  Filled 2024-01-03: qty 1

## 2024-01-03 MED ORDER — TACROLIMUS 0.5 MG PO CAPS: 0.5000 mg | ORAL_CAPSULE | ORAL | Status: DC

## 2024-01-03 MED ORDER — GABAPENTIN 300 MG PO CAPS
300.0000 mg | ORAL_CAPSULE | Freq: Three times a day (TID) | ORAL | Status: DC
  Administered 2024-01-03 – 2024-01-09 (×17): 300 mg via ORAL
  Filled 2024-01-03 (×3): qty 1
  Filled 2024-01-03: qty 3
  Filled 2024-01-03 (×5): qty 1
  Filled 2024-01-03: qty 3
  Filled 2024-01-03 (×6): qty 1

## 2024-01-03 MED ORDER — BUPIVACAINE HCL (PF) 0.5 % IJ SOLN
INTRAMUSCULAR | Status: AC
  Filled 2024-01-03: qty 30

## 2024-01-03 MED ORDER — ACETAMINOPHEN 10 MG/ML IV SOLN
INTRAVENOUS | Status: DC | PRN
  Administered 2024-01-03: 1000 mg via INTRAVENOUS

## 2024-01-03 MED ORDER — SODIUM CHLORIDE 0.9% FLUSH
3.0000 mL | Freq: Two times a day (BID) | INTRAVENOUS | Status: DC
  Administered 2024-01-03 – 2024-01-09 (×12): 3 mL via INTRAVENOUS

## 2024-01-03 MED ORDER — ONDANSETRON HCL 4 MG/2ML IJ SOLN
4.0000 mg | Freq: Four times a day (QID) | INTRAMUSCULAR | Status: DC | PRN
  Administered 2024-01-04: 4 mg via INTRAVENOUS

## 2024-01-03 MED ORDER — ACETAMINOPHEN 325 MG PO TABS
650.0000 mg | ORAL_TABLET | ORAL | Status: DC | PRN
  Administered 2024-01-05 – 2024-01-06 (×4): 650 mg via ORAL
  Filled 2024-01-03 (×4): qty 2

## 2024-01-03 MED ORDER — ORAL CARE MOUTH RINSE: 15.0000 mL | Freq: Once | OROMUCOSAL | Status: AC

## 2024-01-03 MED ORDER — DORZOLAMIDE HCL-TIMOLOL MAL 2-0.5 % OP SOLN
1.0000 [drp] | Freq: Two times a day (BID) | OPHTHALMIC | Status: DC
  Administered 2024-01-03 – 2024-01-09 (×12): 1 [drp] via OPHTHALMIC
  Filled 2024-01-03: qty 10

## 2024-01-03 MED ORDER — PRAVASTATIN SODIUM 20 MG PO TABS
80.0000 mg | ORAL_TABLET | Freq: Every evening | ORAL | Status: DC
  Administered 2024-01-03 – 2024-01-08 (×6): 80 mg via ORAL
  Filled 2024-01-03 (×5): qty 4

## 2024-01-03 MED ORDER — CEFAZOLIN SODIUM-DEXTROSE 2-4 GM/100ML-% IV SOLN
2.0000 g | Freq: Once | INTRAVENOUS | Status: AC
  Administered 2024-01-03 (×2): 2 g via INTRAVENOUS

## 2024-01-03 MED ORDER — PHENYLEPHRINE HCL-NACL 20-0.9 MG/250ML-% IV SOLN
INTRAVENOUS | Status: DC | PRN
  Administered 2024-01-03: 40 ug/min via INTRAVENOUS

## 2024-01-03 SURGICAL SUPPLY — 66 items
ALLOGRAFT BONE FIBER KORE 5 (Bone Implant) IMPLANT
BASIN KIT SINGLE STR (MISCELLANEOUS) ×1 IMPLANT
BRUSH SCRUB EZ 4% CHG (MISCELLANEOUS) ×1 IMPLANT
BUR NEURO DRILL SOFT 3.0X3.8M (BURR) ×1 IMPLANT
CAGE SABLE 10X22 6-12 8D (Cage) IMPLANT
CHLORAPREP W/TINT 26 (MISCELLANEOUS) ×1 IMPLANT
COVERAGE SUPP BRAINLAB NG SPNE (MISCELLANEOUS) IMPLANT
DERMABOND ADVANCED .7 DNX12 (GAUZE/BANDAGES/DRESSINGS) ×1 IMPLANT
DRAPE C-ARM XRAY 36X54 (DRAPES) IMPLANT
DRAPE LAPAROTOMY 100X77 ABD (DRAPES) ×1 IMPLANT
DRAPE MICROSCOPE SPINE 48X150 (DRAPES) IMPLANT
DRAPE SCAN PATIENT (DRAPES) ×1 IMPLANT
DRSG OPSITE POSTOP 4X8 (GAUZE/BANDAGES/DRESSINGS) IMPLANT
DRSG TEGADERM 4X4.75 (GAUZE/BANDAGES/DRESSINGS) IMPLANT
ELECT EZSTD 165MM 6.5IN (MISCELLANEOUS) IMPLANT
ELECT REM PT RETURN 9FT ADLT (ELECTROSURGICAL) ×1 IMPLANT
ELECTRODE EZSTD 165MM 6.5IN (MISCELLANEOUS) IMPLANT
ELECTRODE REM PT RTRN 9FT ADLT (ELECTROSURGICAL) ×1 IMPLANT
EVACUATOR 1/8 PVC DRAIN (DRAIN) IMPLANT
EX-PIN ORTHOLOCK NAV 4X150 (PIN) IMPLANT
FEE CVG SUPP BRAINLAB NG SPNE (MISCELLANEOUS) IMPLANT
FEE INTRAOP CADWELL SUPPLY NCS (MISCELLANEOUS) IMPLANT
FEE INTRAOP MONITOR IMPULS NCS (MISCELLANEOUS) IMPLANT
GAUZE 4X4 16PLY ~~LOC~~+RFID DBL (SPONGE) ×1 IMPLANT
GLOVE BIOGEL PI IND STRL 7.0 (GLOVE) ×2 IMPLANT
GLOVE BIOGEL PI IND STRL 8 (GLOVE) ×1 IMPLANT
GLOVE SRG 8 PF TXTR STRL LF DI (GLOVE) ×1 IMPLANT
GLOVE SURG SYN 7.0 (GLOVE) ×2 IMPLANT
GLOVE SURG SYN 7.0 PF PI (GLOVE) ×2 IMPLANT
GLOVE SURG SYN 7.5 E (GLOVE) ×2 IMPLANT
GLOVE SURG SYN 7.5 PF PI (GLOVE) ×2 IMPLANT
GOWN SRG LRG LVL 4 IMPRV REINF (GOWNS) ×3 IMPLANT
GOWN SRG XL LVL 3 NONREINFORCE (GOWNS) ×1 IMPLANT
HOLDER FOLEY CATH W/STRAP (MISCELLANEOUS) IMPLANT
INTERBODY SABLE 10X26 7-14 15D (Miscellaneous) IMPLANT
INTRAOP CADWELL SUPPLY FEE NCS (MISCELLANEOUS) IMPLANT
INTRAOP MONITOR FEE IMPULS NCS (MISCELLANEOUS) IMPLANT
JET LAVAGE IRRISEPT WOUND (IRRIGATION / IRRIGATOR) ×1 IMPLANT
KIT ACCESS MAXCESS MAS TLIF 2 (KITS) IMPLANT
KIT PREVENA INCISION MGT 13 (CANNISTER) IMPLANT
KIT SPINAL PRONEVIEW (KITS) ×1 IMPLANT
KNIFE BAYONET SHORT DISCETOMY (MISCELLANEOUS) IMPLANT
LAVAGE JET IRRISEPT WOUND (IRRIGATION / IRRIGATOR) ×1 IMPLANT
MANIFOLD NEPTUNE II (INSTRUMENTS) ×1 IMPLANT
MARKER SKIN DUAL TIP RULER LAB (MISCELLANEOUS) ×1 IMPLANT
MARKER SPHERE PSV REFLC 13MM (MARKER) ×7 IMPLANT
NDL SAFETY ECLIPSE 18X1.5 (NEEDLE) ×1 IMPLANT
PACK LAMINECTOMY ARMC (PACKS) ×1 IMPLANT
REDUCTION EXT RELINE MAS MOD (Neuro Prosthesis/Implant) IMPLANT
ROD RELINE MAS LORDOTIC 5.5X60 (Rod) IMPLANT
ROD RELINE TI MAS 5.5X70MM LD (Rod) IMPLANT
SCREW LOCK RELINE 5.5 TULIP (Screw) IMPLANT
SCREW RELINE MAS POLY 6.5X40MM (Screw) IMPLANT
SCREW RELINE RED 6.5X50MM POLY (Screw) IMPLANT
SCREW SHANK MAS MOD 6.5X50MM (Screw) IMPLANT
SCREW SHANK RELINE 6.5X45MM 2C (Screw) IMPLANT
STAPLER SKIN PROX 35W (STAPLE) IMPLANT
SURGIFLO W/THROMBIN 8M KIT (HEMOSTASIS) ×1 IMPLANT
SUT STRATA 3-0 15 PS-2 (SUTURE) ×1 IMPLANT
SUT VIC AB 0 CT1 27XCR 8 STRN (SUTURE) ×1 IMPLANT
SUT VIC AB 2-0 CT1 18 (SUTURE) ×1 IMPLANT
SYR 30ML LL (SYRINGE) ×2 IMPLANT
TOWEL OR 17X26 4PK STRL BLUE (TOWEL DISPOSABLE) ×1 IMPLANT
TRAP FLUID SMOKE EVACUATOR (MISCELLANEOUS) ×1 IMPLANT
TRAY FOLEY SLVR 16FR LF STAT (SET/KITS/TRAYS/PACK) IMPLANT
WATER STERILE IRR 1000ML POUR (IV SOLUTION) ×1 IMPLANT

## 2024-01-03 NOTE — Anesthesia Procedure Notes (Signed)
 Procedure Name: Intubation Date/Time: 01/03/2024 12:10 PM  Performed by: Morene Crocker, CRNAPre-anesthesia Checklist: Patient identified, Emergency Drugs available, Suction available and Patient being monitored Patient Re-evaluated:Patient Re-evaluated prior to induction Oxygen Delivery Method: Circle system utilized Preoxygenation: Pre-oxygenation with 100% oxygen Induction Type: IV induction Ventilation: Mask ventilation without difficulty Laryngoscope Size: McGrath and 3 Grade View: Grade I Tube type: Oral Tube size: 7.0 mm Number of attempts: 1 Airway Equipment and Method: Stylet and Oral airway Placement Confirmation: ETT inserted through vocal cords under direct vision, positive ETCO2 and breath sounds checked- equal and bilateral Secured at: 21 cm Tube secured with: Tape Dental Injury: Teeth and Oropharynx as per pre-operative assessment  Comments: Cords clear, no trauma. CA

## 2024-01-03 NOTE — Op Note (Signed)
 Indications: Ms. Malory Spurr is suffering from left-sided lumbar radiculopathy and stenosis secondary to spondylolisthesis of the lumbar region with lumbar radiculopathy and spinal stenosis as well as claudication.. The patient tried and failed conservative management, prompting surgical intervention.  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   Preoperative Note:   Risks of surgery discussed include: infection, bleeding, stroke, coma, death, paralysis, CSF leak, nerve/spinal cord injury, numbness, tingling, weakness, complex regional pain syndrome, recurrent stenosis and/or disc herniation, vascular injury, development of instability, neck/back pain, need for further surgery, persistent symptoms, development of deformity, and the risks of anesthesia. The patient understood these risks and agreed to proceed.  Operative Note:  1. Transforaminal Lumbar Interbody Fusion L4/S1 2. Posterolateral arthrodesis L4 to S1 3. Posterior segmental instrumentation L5 to S1  4. Use of stereotaxis (Brainlab) 5. Harvesting of autograft via the same incision 6. Placement of a biomechanical device (Globus Sable) at L4/S1 for anterior arthrodesis  The patient was brought to the Operating Room, intubated and turned into the prone position. All pressure points were checked and double checked. The patient was prepped and draped in the standard fashion. A full timeout was performed.  Preoperative antibiotics were given.   After draping, the stereotactic array was placed.  Stereotactic imaging was acquired and registered to the Exline system.  The image guidance system was used to choose bilateral [place] incisions. The incisions were injected with local anesthetic.  Each incision was opened with a scalpel, bovie electrocautery was used to open the fascia.  Using stereotactic guidance, each pedicle from L4-S1 was cannulated.  Utilizing the guide wireless technique we then performed an undersized tap, and placed the NuVasive reline screws on the contralateral side, on the ipsilateral side we placed the NuVasive screws in similar fashion with the integrated MASTLIF retractor system.  After placement of pedicle screws, we then turned our attention to transforaminal lumbar interbody fusion.  The retractor system was placed and helped [place] to expose the posterior lateral elements of the L4-L5 levels.  The facet was removed with osteotomies and handed off for preparation as autograft.    The traversing and exiting nerve roots on the left were identified and protected. The disc was opened using a scalpel. After incising the disc space, we took a combination of pituitary rongeurs, Kerrison rongeurs, disc scrapers, and curettes to remove a majority of the disc material.  We prepared the end plates for accepting the interbody fusion.  We removed the cartilaginous plate, preserved the cortical endplate if possible during this procedure.  The disc space was irrigated.  The space was prefilled with allograft and autograft.  The Globus Sable biomechanical device was inserted, then backfilled with a mixture of allograft and autograft. During placement, the nerve roots and dural sac were carefully protected without any leaks identified. After placement of the device, the canal was fully inspected and all nerve roots were checked to ensure full decompression.  The retractor was removed.  We then  repeated this process at the L5-S1 level.  Utilizing high-speed drill we performed facetectomies and harvested bone for autograft.  We are able  to decompress the nerve root that was traversing as well as the exiting nerve root.  This gave [place] good access to the disc space.  We opened this with the incision.  We are then able to tap into the discectomy and prepare the endplate as previously.  The disc space was irrigated and prefilled and post filled with autograft.  Rods measured and placed. The rods were secured using locking caps to manufacturer's specifications. CT scan was taken to confirm instrumentation placement. The wound was copiously irrigated, then the posterior elements were prepared for arthrodesis.  A mixture of allograft and autograft was placed over the decorticated surfaces for arthrodesis.   After hemostasis sterile/antiseptic irrigation, the wound was closed in layers with 0 and 2-0 vicryl. 3-0 monocryl and dermabond was applied to the incision. A sterile dressing was placed.  The patient was then flipped supine and extubated with incident. All counts were correct times 2 at the end of the case. No immediate complications were noted.  Cheyanna Flores PA assisted in the entire procedure. An assistant was required for this procedure due to the complexity.  The assistant provided assistance in tissue manipulation and suction, and was required for the successful and safe performance of the procedure. I performed the critical portions of the procedure.  Implant Name Type Inv. Item Serial No. Manufacturer Lot No. LRB No. Used Action  ALLOGRAFT BONE FIBER KORE 5 - [id number] Bone Implant ALLOGRAFT BONE FIBER KORE 5 [id number] MUSCULOSKELETL TRANSPLANT FNDN  N/A 1 Implanted  INTERBODY SABLE 10X26 7-14 15D - [id number] Miscellaneous INTERBODY SABLE 10X26 7-14 15D  [employer] [id number] N/A 1 Implanted  CAGE SABLE 10X22 6-12 8D - [id number] Cage CAGE SABLE 10X22 6-12 8D  [employer] [id number] N/A 1 Implanted  SCREW RELINE RED 6.5X50MM POLY - [id number] Screw SCREW RELINE RED 6.5X50MM POLY  [employer]  N/A 2 Implanted  SCREW RELINE MAS POLY 6.5X40MM - [id number] Screw SCREW RELINE MAS POLY 6.5X40MM  [employer]  N/A 1 Implanted  SCREW SHANK MAS MOD 6.5X50MM - [id number] Screw SCREW SHANK MAS MOD 6.5X50MM  [employer]  N/A 2 Implanted  SCREW SHANK RELINE 6.5X45MM 2C - [id number] Screw SCREW SHANK RELINE 6.5X45MM 2C  [employer]  N/A 1 Implanted  REDUCTION EXT RELINE MAS MOD - [id number] Neuro Prosthesis/Implant REDUCTION EXT RELINE MAS MOD  [employer]  N/A 3 Implanted  ROD RELINE MAS LORDOTIC 5.5X60 - [id number] Rod ROD RELINE MAS LORDOTIC 5.5X60  [employer]  N/A 1 Implanted  ROD RELINE TI MAS 5.5X70MM LD - [id number] Rod ROD RELINE TI MAS 5.5X70MM LD  [employer]  N/A 1 Implanted     Lovenia Kim, MD

## 2024-01-03 NOTE — Interval H&P Note (Signed)
 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  The various methods of treatment have been discussed with the patient and family. After consideration of risks, benefits and other options for treatment, the patient has consented to  Procedure(s) with comments: MINIMALLY INVASIVE (MIS) TRANSFORAMINAL LUMBAR INTERBODY FUSION (TLIF) 2 LEVEL (N/A) - L4-S1 MAXIMUM ACCESS (MAS) TRANSFORAMINAL LUMBAR INTERBODY FUSION (TLIF), LEFT APPROACH APPLICATION OF INTRAOPERATIVE CT SCAN (N/A) as a surgical intervention.  The patient's history has been reviewed, patient examined, no change in status, stable for surgery.  I have reviewed the patient's chart and labs.  Questions were answered to the patient's satisfaction.    Heart and Lungs are clear  Lovenia Kim

## 2024-01-03 NOTE — Progress Notes (Signed)
 Referring Physician:  No referring provider defined for this encounter.  Primary Physician:  Eustaquio Boyden, MD  History of Present Illness: 01/03/2024 [name] is here today with a chief complaint of back pain that worsens when standing.  Her back pain has been present for multiple years but continues to worsen.  She has had conservative management including injections with physical medicine and rehabilitation as well as a comprehensive physical therapy regimen.  She has not had significant relief with her injections nor her physical therapy.  Given her history of a sacral stimulator she has had a recent CT myelogram and was referred to me by Kristen Kirk who has been following her in clinic.  She does get back pain that radiates into her left leg predominantly.  This starts in her mid back and radiates down the posterior aspect of her thigh sometimes crossing her knee.  She gets numbness and tingling when she walks or stands for a prolonged period it gets better when she sits down's and leans forward.  Medically she does have a history of a kidney transplant, so has been limited to Tylenol and no NSAID use.  She does not smoke.  She has a history of osteoporosis but has been treated with Fosamax.  The symptoms are causing a significant impact on the patient's life.   I have utilized the care everywhere function in epic to review the outside records available from external health systems.  Review of Systems:  A 10 point review of systems is negative, except for the pertinent positives and negatives detailed in the HPI.  Past Medical History: Past Medical History:  Diagnosis Date   Anemia in chronic kidney disease    a. s/p aranesp 11/2013 now starting epo with HD 06/2014   Chronic low back pain    CKD (chronic kidney disease), stage V (HCC) 01/2014   Diabetes mellitus type II    a. Currently diet controlled since losing weight.   DJD (degenerative joint disease)    Essential  hypertension    Fecal incontinence    decreased internal sphincter function   GERD (gastroesophageal reflux disease)    History of echocardiogram    a. 01/2015 Echo [facility]): EF ?55%, triv MR, mildly dil LA, nl PV.   History of end stage renal disease    previous on HD;   received kidney transplant 07/ 2018 (on dialysis since 2015 MWF)  followed by [facility] (Dr. Linus Salmons) considering transplant Access: LUE fistula Establishing with [facility]   History of stress test    a. 03/2013 Myoview [facility]): EF 63%, no ischemia.   History of syncope    a. 12/2015.   History of Urge Incontinence    HLD (hyperlipidemia)    Immunosuppressed status (HCC)    Mixed incontinence urge and stress    Osteoarthritis    Osteoporosis 05/03/2019   DEXA 04/2019 - 1.5 spine, -2.1 hip, -2.5 forearm Check phosphate and PTH to guide further management in h/o kidney transplant   Primary open angle glaucoma (POAG) of both eyes    a. L>R   S/P kidney transplant 04/2017   @[facility] ---   per pt one kidney from living-donor   Spinal stenosis of lumbar region    Spondylolisthesis of lumbar region    Vaginal atrophy    a. vagifem caused spotting, normal TVS   Vitamin D deficiency    Wears glasses    stated on 05/31/2022   Wears partial dentures  upper 2 teeth. 05/31/2022    Past Surgical History: Past Surgical History:  Procedure Laterality Date   ANAL RECTAL MANOMETRY N/A 03/16/2021   Procedure: ANO RECTAL MANOMETRY;  Surgeon: Napoleon Form, MD;  Location: [facility];  Service: Endoscopy;  Laterality: N/A;   AQUEOUS SHUNT Left 02/25/2021   AV FISTULA PLACEMENT, RADIOCEPHALIC Left 02/20/2014   BLADDER SUSPENSION  2011   for stress incontinence-Dr. MacDiarmid   CATARACT EXTRACTION W/ INTRAOCULAR LENS IMPLANT Right 02/11/2020   CATARACT EXTRACTION W/ INTRAOCULAR LENS IMPLANT Left 11/23/2021   COLONOSCOPY  03/2019   diverticulosis, rpt 10 yrs (Nandigam)   KIDNEY TRANSPLANT Right 04/02/2017   [facility])   LIGATION OF ARTERIOVENOUS  FISTULA Left 10/30/2017   THROMBOSED AVF   NEPHRECTOMY TRANSPLANTED ORGAN     REFRACTIVE SURGERY     REFRACTIVE SURGERY Left 06/2011   SACRAL NERVE STIMULATOR PLACEMENT  07/2022   TRABECULECTOMY Right 04/17/2012   TUBAL LIGATION Bilateral    1990s    Allergies: Allergies as of 12/11/2023 - Review Complete 11/27/2023  Allergen Reaction Noted   Brimonidine  03/02/2014   Shellfish-derived products Swelling 03/02/2014   Tramadol Other (See Comments) 01/05/2020    Medications:  Current Facility-Administered Medications:    0.9 %  sodium chloride infusion, , Intravenous, Continuous, Kristen Kirk, Kristen Harold, MD   ceFAZolin (ANCEF) IVPB 2g/100 mL premix, 2 g, Intravenous, Once, Lovenia Kim, MD   chlorhexidine (PERIDEX) 0.12 % solution 15 mL, 15 mL, Mouth/Throat, Once **OR** Oral care mouth rinse, 15 mL, Mouth Rinse, Once, Foye Deer, MD   vancomycin (VANCOCIN) IVPB 1000 mg/200 mL premix, 1,000 mg, Intravenous, Once, Lovenia Kim, MD  Social History: Social History   Tobacco Use   Smoking status: Former    Current packs/day: 0.00    Types: Cigarettes    Quit date: 1987    Years since quitting: 38.2   Smokeless tobacco: Never   Tobacco comments:    Remote- quit ~ late 1980's.  Vaping Use   Vaping status: Never Used  Substance Use Topics   Alcohol use: No    Alcohol/week: 0.0 standard drinks of alcohol   Drug use: No    Family Medical History: Family History  Problem Relation Age of Onset   Other Father        she did not know her father.   Diabetes Mother    Heart failure Mother        died @ 45   Stroke Brother    Hyperlipidemia Brother    Hypertension Brother    Hyperlipidemia Brother    Hypertension Brother    Hyperlipidemia Sister    Hypertension Sister    Hyperlipidemia Sister    Hypertension Sister    Heart failure Maternal Grandfather    Breast cancer Neg Hx    Colon cancer Neg Hx    Esophageal  cancer Neg Hx    Stomach cancer Neg Hx    Rectal cancer Neg Hx     Physical Examination: There were no vitals filed for this visit.   General: Patient is in no apparent distress. Attention to examination is appropriate.  Neck:   Supple.  Full range of motion.  Respiratory: Patient is breathing without any difficulty.   NEUROLOGICAL:     Awake, alert, oriented to person, place, and time.  Speech is clear and fluent.   Cranial Nerves: Pupils equal round and reactive to light.  Facial tone is symmetric.  Facial sensation is symmetric.  Shoulder shrug is symmetric. Tongue protrusion is midline.    Strength:  Side Iliopsoas Quads Hamstring PF DF EHL  R 5 5 5 5 5 5   L 5 5 5 5 5 5    Reflexes are 1+ and symmetric at the biceps, triceps, brachioradialis, patella and achilles.   Hoffman's is absent. Clonus is absent  Bilateral upper and lower extremity sensation is intact to light touch .     No evidence of dysmetria noted.  Is antalgic with forward carriage.  Imaging:   Narrative & Impression  CLINICAL DATA:  Spinal stenosis.  Back pain and leg weakness.   EXAM: CT LUMBAR SPINE WITH CONTRAST   TECHNIQUE: Multidetector CT imaging of the lumbar spine was performed with intravenous contrast administration.   RADIATION DOSE REDUCTION: This exam was performed according to the departmental dose-optimization program which includes automated exposure control, adjustment of the mA and/or kV according to patient size and/or use of iterative reconstruction technique.   COMPARISON:  Myelogram study same day   FINDINGS: Primarily subdural injection. The study remains sufficiently diagnostic.   Segmentation: 5 lumbar type vertebral bodies.   Alignment: 6-7 mm degenerative anterolisthesis L4-5.   Vertebrae: No fracture or focal bone lesion.   Paraspinal and other soft tissues: Atrophic native kidneys. Renal transplant right iliac fossa.   Disc levels: Mild non-compressive  disc bulges at T11-12, T12-L1 and L1-2.   L2-3: Moderate disc bulge. Mild facet and ligamentous hypertrophy. No compressive canal stenosis. Bilateral foraminal narrowing could affect either L2 nerve.   L3-4: Moderate disc bulge. Facet and ligamentous hypertrophy. Mild multifactorial stenosis of the canal. Mild bilateral foraminal stenosis.   L4-5: Chronic facet arthropathy with degenerative anterolisthesis of 6-7 mm. No disc herniation. Severe stenosis of the central canal and both neural foramina, likely to be symptomatic.   L5-S1: Disc degeneration and vacuum phenomenon. Endplate osteophytes and bulging of the disc. Left posterolateral disc herniation probably focally affecting the left S1 nerve. Bilateral facet degeneration and hypertrophy. Bilateral foraminal stenosis could compress either exiting L5 nerve.   IMPRESSION: 1. Primarily subdural injection. 2. L4-5: Chronic facet arthropathy with degenerative anterolisthesis of 6-7 mm. Severe stenosis of the central canal and both neural foramina, likely to be symptomatic. 3. L5-S1: Disc degeneration and vacuum phenomenon. Endplate osteophytes and bulging of the disc. Left posterolateral disc herniation probably focally affecting the left S1 nerve. Bilateral foraminal stenosis could compress either exiting L5 nerve. 4. L2-3: Moderate disc bulge. Mild facet and ligamentous hypertrophy. Bilateral foraminal narrowing could affect either L2 nerve. 5. L3-4: Moderate disc bulge. Facet and ligamentous hypertrophy. Mild multifactorial stenosis of the canal. Mild bilateral foraminal stenosis.     Electronically Signed   By: Paulina Fusi M.D.   On: 10/29/2023 11:29      Of also reviewed her outside x-rays which demonstrated a grade 2 spondylolisthesis that has some evidence of change on flexion extension at L4-5.  She also has collapsed disc space that tell 5 S1 and foraminal changes at L3-L4.  I have personally reviewed the images  and agree with the above interpretation.  Medical Decision Making/Assessment and Plan: Ms. Cabana is a pleasant 73 y.o. female with history of chronic back pain with radiating into her lower extremities.  Her symptoms are consistent with lumbar radiculopathy, neurogenic stenosis and claudication, and mechanical back pain.  She worsens when she is standing and improves with recumbency.  She has had significant physical therapy as well as injection management with physical medicine and rehabilitation.  She has been dealing with these issues for multiple years and feels that she is exhausting her conservative management.  She does have a history of osteoporosis and for that would like to repeat her DEXA scan to evaluate for any improvement in her bone quality in the setting of Fosamax treatment.  Once she undergoes that we will be able to discuss any plans going forward from a surgical standpoint.  She does have diffuse spondylosis throughout her entire thoracolumbar spine, however her grade 2 spondylolisthesis at L4-5 as well as her disc collapse and bilateral foraminal stenosis at L5-S1 seem to be the worst.  She does have some foraminal disease above.  Given that her back pain is worse than her leg pain, if her bone density is quality we would consider a lumbar fusion from L4-S1 or from L3-S1 with a posterior approach and interbody fusions at corresponding levels.  This will give [place] access to her left-sided facets and foramina to help directly and into directly decompress the exiting nerve roots.  We discussed the risks and benefits of surgery as well as the natural history of her disease.  We discussed the major nature of this procedure and that we like to utilize this as a last resort.  She has done her conservative management including injections and physical therapy.   Thank you for involving me in the care of this patient.    Lovenia Kim MD/MSCR Neurosurgery

## 2024-01-03 NOTE — H&P (View-Only) (Signed)
 Referring Physician:  No referring provider defined for this encounter.  Primary Physician:  Eustaquio Boyden, MD  History of Present Illness: 01/03/2024 [name] is here today with a chief complaint of back pain that worsens when standing.  Her back pain has been present for multiple years but continues to worsen.  She has had conservative management including injections with physical medicine and rehabilitation as well as a comprehensive physical therapy regimen.  She has not had significant relief with her injections nor her physical therapy.  Given her history of a sacral stimulator she has had a recent CT myelogram and was referred to me by Drake Leach who has been following her in clinic.  She does get back pain that radiates into her left leg predominantly.  This starts in her mid back and radiates down the posterior aspect of her thigh sometimes crossing her knee.  She gets numbness and tingling when she walks or stands for a prolonged period it gets better when she sits down's and leans forward.  Medically she does have a history of a kidney transplant, so has been limited to Tylenol and no NSAID use.  She does not smoke.  She has a history of osteoporosis but has been treated with Fosamax.  The symptoms are causing a significant impact on the patient's life.   I have utilized the care everywhere function in epic to review the outside records available from external health systems.  Review of Systems:  A 10 point review of systems is negative, except for the pertinent positives and negatives detailed in the HPI.  Past Medical History: Past Medical History:  Diagnosis Date   Anemia in chronic kidney disease    a. s/p aranesp 11/2013 now starting epo with HD 06/2014   Chronic low back pain    CKD (chronic kidney disease), stage V (HCC) 01/2014   Diabetes mellitus type II    a. Currently diet controlled since losing weight.   DJD (degenerative joint disease)    Essential  hypertension    Fecal incontinence    decreased internal sphincter function   GERD (gastroesophageal reflux disease)    History of echocardiogram    a. 01/2015 Echo [facility]): EF ?55%, triv MR, mildly dil LA, nl PV.   History of end stage renal disease    previous on HD;   received kidney transplant 07/ 2018 (on dialysis since 2015 MWF)  followed by [facility] (Dr. Linus Salmons) considering transplant Access: LUE fistula Establishing with [facility]   History of stress test    a. 03/2013 Myoview [facility]): EF 63%, no ischemia.   History of syncope    a. 12/2015.   History of Urge Incontinence    HLD (hyperlipidemia)    Immunosuppressed status (HCC)    Mixed incontinence urge and stress    Osteoarthritis    Osteoporosis 05/03/2019   DEXA 04/2019 - 1.5 spine, -2.1 hip, -2.5 forearm Check phosphate and PTH to guide further management in h/o kidney transplant   Primary open angle glaucoma (POAG) of both eyes    a. L>R   S/P kidney transplant 04/2017   @[facility] ---   per pt one kidney from living-donor   Spinal stenosis of lumbar region    Spondylolisthesis of lumbar region    Vaginal atrophy    a. vagifem caused spotting, normal TVS   Vitamin D deficiency    Wears glasses    stated on 05/31/2022   Wears partial dentures  upper 2 teeth. 05/31/2022    Past Surgical History: Past Surgical History:  Procedure Laterality Date   ANAL RECTAL MANOMETRY N/A 03/16/2021   Procedure: ANO RECTAL MANOMETRY;  Surgeon: Napoleon Form, MD;  Location: [facility];  Service: Endoscopy;  Laterality: N/A;   AQUEOUS SHUNT Left 02/25/2021   AV FISTULA PLACEMENT, RADIOCEPHALIC Left 02/20/2014   BLADDER SUSPENSION  2011   for stress incontinence-Dr. MacDiarmid   CATARACT EXTRACTION W/ INTRAOCULAR LENS IMPLANT Right 02/11/2020   CATARACT EXTRACTION W/ INTRAOCULAR LENS IMPLANT Left 11/23/2021   COLONOSCOPY  03/2019   diverticulosis, rpt 10 yrs (Nandigam)   KIDNEY TRANSPLANT Right 04/02/2017   [facility]   LIGATION OF ARTERIOVENOUS  FISTULA Left 10/30/2017   THROMBOSED AVF   NEPHRECTOMY TRANSPLANTED ORGAN     REFRACTIVE SURGERY     REFRACTIVE SURGERY Left 06/2011   SACRAL NERVE STIMULATOR PLACEMENT  07/2022   TRABECULECTOMY Right 04/17/2012   TUBAL LIGATION Bilateral    1990s    Allergies: Allergies as of 12/11/2023 - Review Complete 11/27/2023  Allergen Reaction Noted   Brimonidine  03/02/2014   Shellfish-derived products Swelling 03/02/2014   Tramadol Other (See Comments) 01/05/2020    Medications:  Current Facility-Administered Medications:    0.9 %  sodium chloride infusion, , Intravenous, Continuous, Laural Benes, Corey Harold, MD   ceFAZolin (ANCEF) IVPB 2g/100 mL premix, 2 g, Intravenous, Once, Lovenia Kim, MD   chlorhexidine (PERIDEX) 0.12 % solution 15 mL, 15 mL, Mouth/Throat, Once **OR** Oral care mouth rinse, 15 mL, Mouth Rinse, Once, Foye Deer, MD   vancomycin (VANCOCIN) IVPB 1000 mg/200 mL premix, 1,000 mg, Intravenous, Once, Lovenia Kim, MD  Social History: Social History   Tobacco Use   Smoking status: Former    Current packs/day: 0.00    Types: Cigarettes    Quit date: 1987    Years since quitting: 38.2   Smokeless tobacco: Never   Tobacco comments:    Remote- quit ~ late 1980's.  Vaping Use   Vaping status: Never Used  Substance Use Topics   Alcohol use: No    Alcohol/week: 0.0 standard drinks of alcohol   Drug use: No    Family Medical History: Family History  Problem Relation Age of Onset   Other Father        she did not know her father.   Diabetes Mother    Heart failure Mother        died @ 45   Stroke Brother    Hyperlipidemia Brother    Hypertension Brother    Hyperlipidemia Brother    Hypertension Brother    Hyperlipidemia Sister    Hypertension Sister    Hyperlipidemia Sister    Hypertension Sister    Heart failure Maternal Grandfather    Breast cancer Neg Hx    Colon cancer Neg Hx    Esophageal  cancer Neg Hx    Stomach cancer Neg Hx    Rectal cancer Neg Hx     Physical Examination: There were no vitals filed for this visit.   General: Patient is in no apparent distress. Attention to examination is appropriate.  Neck:   Supple.  Full range of motion.  Respiratory: Patient is breathing without any difficulty.   NEUROLOGICAL:     Awake, alert, oriented to person, place, and time.  Speech is clear and fluent.   Cranial Nerves: Pupils equal round and reactive to light.  Facial tone is symmetric.  Facial sensation is symmetric.  Shoulder shrug is symmetric. Tongue protrusion is midline.    Strength:  Side Iliopsoas Quads Hamstring PF DF EHL  R 5 5 5 5 5 5   L 5 5 5 5 5 5    Reflexes are 1+ and symmetric at the biceps, triceps, brachioradialis, patella and achilles.   Hoffman's is absent. Clonus is absent  Bilateral upper and lower extremity sensation is intact to light touch .     No evidence of dysmetria noted.  Is antalgic with forward carriage.  Imaging:   Narrative & Impression  CLINICAL DATA:  Spinal stenosis.  Back pain and leg weakness.   EXAM: CT LUMBAR SPINE WITH CONTRAST   TECHNIQUE: Multidetector CT imaging of the lumbar spine was performed with intravenous contrast administration.   RADIATION DOSE REDUCTION: This exam was performed according to the departmental dose-optimization program which includes automated exposure control, adjustment of the mA and/or kV according to patient size and/or use of iterative reconstruction technique.   COMPARISON:  Myelogram study same day   FINDINGS: Primarily subdural injection. The study remains sufficiently diagnostic.   Segmentation: 5 lumbar type vertebral bodies.   Alignment: 6-7 mm degenerative anterolisthesis L4-5.   Vertebrae: No fracture or focal bone lesion.   Paraspinal and other soft tissues: Atrophic native kidneys. Renal transplant right iliac fossa.   Disc levels: Mild non-compressive  disc bulges at T11-12, T12-L1 and L1-2.   L2-3: Moderate disc bulge. Mild facet and ligamentous hypertrophy. No compressive canal stenosis. Bilateral foraminal narrowing could affect either L2 nerve.   L3-4: Moderate disc bulge. Facet and ligamentous hypertrophy. Mild multifactorial stenosis of the canal. Mild bilateral foraminal stenosis.   L4-5: Chronic facet arthropathy with degenerative anterolisthesis of 6-7 mm. No disc herniation. Severe stenosis of the central canal and both neural foramina, likely to be symptomatic.   L5-S1: Disc degeneration and vacuum phenomenon. Endplate osteophytes and bulging of the disc. Left posterolateral disc herniation probably focally affecting the left S1 nerve. Bilateral facet degeneration and hypertrophy. Bilateral foraminal stenosis could compress either exiting L5 nerve.   IMPRESSION: 1. Primarily subdural injection. 2. L4-5: Chronic facet arthropathy with degenerative anterolisthesis of 6-7 mm. Severe stenosis of the central canal and both neural foramina, likely to be symptomatic. 3. L5-S1: Disc degeneration and vacuum phenomenon. Endplate osteophytes and bulging of the disc. Left posterolateral disc herniation probably focally affecting the left S1 nerve. Bilateral foraminal stenosis could compress either exiting L5 nerve. 4. L2-3: Moderate disc bulge. Mild facet and ligamentous hypertrophy. Bilateral foraminal narrowing could affect either L2 nerve. 5. L3-4: Moderate disc bulge. Facet and ligamentous hypertrophy. Mild multifactorial stenosis of the canal. Mild bilateral foraminal stenosis.     Electronically Signed   By: Paulina Fusi M.D.   On: 10/29/2023 11:29      Of also reviewed her outside x-rays which demonstrated a grade 2 spondylolisthesis that has some evidence of change on flexion extension at L4-5.  She also has collapsed disc space that tell 5 S1 and foraminal changes at L3-L4.  I have personally reviewed the images  and agree with the above interpretation.  Medical Decision Making/Assessment and Plan: [name] is a pleasant 73 y.o. female with history of chronic back pain with radiating into her lower extremities.  Her symptoms are consistent with lumbar radiculopathy, neurogenic stenosis and claudication, and mechanical back pain.  She worsens when she is standing and improves with recumbency.  She has had significant physical therapy as well as injection management with physical medicine and rehabilitation.  She has been dealing with these issues for multiple years and feels that she is exhausting her conservative management.  She does have a history of osteoporosis and for that would like to repeat her DEXA scan to evaluate for any improvement in her bone quality in the setting of Fosamax treatment.  Once she undergoes that we will be able to discuss any plans going forward from a surgical standpoint.  She does have diffuse spondylosis throughout her entire thoracolumbar spine, however her grade 2 spondylolisthesis at L4-5 as well as her disc collapse and bilateral foraminal stenosis at L5-S1 seem to be the worst.  She does have some foraminal disease above.  Given that her back pain is worse than her leg pain, if her bone density is quality we would consider a lumbar fusion from L4-S1 or from L3-S1 with a posterior approach and interbody fusions at corresponding levels.  This will give [place] access to her left-sided facets and foramina to help directly and into directly decompress the exiting nerve roots.  We discussed the risks and benefits of surgery as well as the natural history of her disease.  We discussed the major nature of this procedure and that we like to utilize this as a last resort.  She has done her conservative management including injections and physical therapy.   Thank you for involving me in the care of this patient.    Lovenia Kim MD/MSCR Neurosurgery

## 2024-01-03 NOTE — Progress Notes (Signed)
 BP 141/80 HR 58 sat 98%RA  pain 7/10  medicated as ordered.  Dressing lumbar area c/d/I with dermabond/honeycomb.

## 2024-01-03 NOTE — Transfer of Care (Signed)
 Immediate Anesthesia Transfer of Care Note  Patient: Kristen Kirk  Procedure(s) Performed: MINIMALLY INVASIVE (MIS) TRANSFORAMINAL LUMBAR INTERBODY FUSION (TLIF) 2 LEVEL (Back) APPLICATION OF INTRAOPERATIVE CT SCAN (Back)  Patient Location: PACU  Anesthesia Type:General  Level of Consciousness: drowsy  Airway & Oxygen Therapy: Patient Spontanous Breathing and Patient connected to face mask oxygen  Post-op Assessment: Report given to RN and Post -op Vital signs reviewed and stable  Post vital signs: Reviewed and stable  Last Vitals:  Vitals Value Taken Time  BP 150/76 01/03/24 1653  Temp    Pulse 62 01/03/24 1657  Resp 18 01/03/24 1657  SpO2 100 % 01/03/24 1657  Vitals shown include unfiled device data.  Last Pain:  Vitals:   01/03/24 1144  TempSrc: Temporal         Complications: No notable events documented.

## 2024-01-03 NOTE — Anesthesia Preprocedure Evaluation (Addendum)
 Anesthesia Evaluation  Patient identified by MRN, date of birth, ID band Patient awake    Reviewed: Allergy & Precautions, H&P , NPO status , Patient's Chart, lab work & pertinent test results, reviewed documented beta blocker date and time   Airway Mallampati: II  TM Distance: >3 FB Neck ROM: full    Dental no notable dental hx. (+) Missing,    Pulmonary former smoker   Pulmonary exam normal        Cardiovascular Exercise Tolerance: Poor hypertension, Pt. on medications and Pt. on home beta blockers Normal cardiovascular exam     Neuro/Psych SACRAL NERVE STIMULATOR  Neuromuscular disease (Spinal stenosis of lumbar region)  negative psych ROS   GI/Hepatic Neg liver ROS,GERD  ,,  Endo/Other  diabetes, Type 2  Immunosuppressed status  Renal/GU Renal InsufficiencyRenal disease received kidney transplant 07/ 2018      Musculoskeletal   Abdominal Normal abdominal exam  (+)   Peds  Hematology  (+) Blood dyscrasia, anemia   Anesthesia Other Findings Past Medical History: No date: Anemia in chronic kidney disease     Comment:  a. s/p aranesp 11/2013 now starting epo with HD 06/2014 No date: Chronic low back pain 01/2014: CKD (chronic kidney disease), stage V (HCC) No date: Diabetes mellitus type II     Comment:  a. Currently diet controlled since losing weight. No date: DJD (degenerative joint disease) No date: Essential hypertension No date: Fecal incontinence     Comment:  decreased internal sphincter function No date: GERD (gastroesophageal reflux disease) No date: History of echocardiogram     Comment:  a. 01/2015 Echo [facility]): EF ?55%, triv MR, mildly dil LA,               nl PV. No date: History of end stage renal disease     Comment:  previous on HD;   received kidney transplant 07/ 2018               (on dialysis since 2015 MWF)  followed by [facility] renal               (Dr. Linus Salmons) considering transplant Access: LUE  fistula               Establishing with [facility] No date: History of stress test     Comment:  a. 03/2013 Myoview [facility]): EF 63%, no ischemia. No date: History of syncope     Comment:  a. 12/2015. No date: History of Urge Incontinence No date: HLD (hyperlipidemia) No date: Immunosuppressed status (HCC) No date: Mixed incontinence urge and stress No date: Osteoarthritis 05/03/2019: Osteoporosis     Comment:  DEXA 04/2019 - 1.5 spine, -2.1 hip, -2.5 forearm Check               phosphate and PTH to guide further management in h/o               kidney transplant No date: Primary open angle glaucoma (POAG) of both eyes     Comment:  a. L>R 04/2017: S/P kidney transplant     Comment:  @[facility] ---   per pt one kidney from living-donor No date: Spinal stenosis of lumbar region No date: Spondylolisthesis of lumbar region No date: Vaginal atrophy     Comment:  a. vagifem caused spotting, normal TVS No date: Vitamin D deficiency No date: Wears glasses     Comment:  stated on 05/31/2022 No date: Wears partial dentures     Comment:  upper 2 teeth. 05/31/2022  Past Surgical History: 03/16/2021: ANAL RECTAL MANOMETRY; N/A     Comment:  Procedure: ANO RECTAL MANOMETRY;  Surgeon: Napoleon Form, MD;  Location: [facility];  Service:               Endoscopy;  Laterality: N/A; 02/25/2021: AQUEOUS SHUNT; Left 02/20/2014: AV FISTULA PLACEMENT, RADIOCEPHALIC; Left 2011: BLADDER SUSPENSION     Comment:  for stress incontinence-Dr. MacDiarmid 02/11/2020: CATARACT EXTRACTION W/ INTRAOCULAR LENS IMPLANT; Right 11/23/2021: CATARACT EXTRACTION W/ INTRAOCULAR LENS IMPLANT; Left 03/2019: COLONOSCOPY     Comment:  diverticulosis, rpt 10 yrs (Nandigam) 04/02/2017: KIDNEY TRANSPLANT; Right     Comment:  [facility]) 10/30/2017: LIGATION OF ARTERIOVENOUS  FISTULA; Left     Comment:  THROMBOSED AVF No date: NEPHRECTOMY TRANSPLANTED ORGAN No date: REFRACTIVE SURGERY 06/2011:  REFRACTIVE SURGERY; Left 07/2022: SACRAL NERVE STIMULATOR PLACEMENT 04/17/2012: TRABECULECTOMY; Right No date: TUBAL LIGATION; Bilateral     Comment:  1990s     Reproductive/Obstetrics negative OB ROS                              Anesthesia Physical Anesthesia Plan  ASA: 3  Anesthesia Plan: General ETT   Post-op Pain Management: Minimal or no pain anticipated   Induction: Intravenous  PONV Risk Score and Plan: 2 and Ondansetron and Dexamethasone  Airway Management Planned: Oral ETT  Additional Equipment:   Intra-op Plan:   Post-operative Plan: Extubation in OR  Informed Consent: I have reviewed the patients History and Physical, chart, labs and discussed the procedure including the risks, benefits and alternatives for the proposed anesthesia with the patient or authorized representative who has indicated his/her understanding and acceptance.     Dental Advisory Given  Plan Discussed with: CRNA and Surgeon  Anesthesia Plan Comments:          Anesthesia Quick Evaluation

## 2024-01-03 NOTE — Progress Notes (Signed)
 Patient awake, sleepy, re-oriented. Attempted to eat [place] sandwich for dinner, became nauseate, medicated: medication effective. At this time patient states pain 3/10.  Miss Apodaca is able to bend bilateral lower ext, able to push. Pulses +2. Vitals stable afebrile Dressings to lumbar area x2 c/d/i

## 2024-01-03 NOTE — Anesthesia Postprocedure Evaluation (Signed)
 Anesthesia Post Note  Patient: Kristen Kirk  Procedure(s) Performed: MINIMALLY INVASIVE (MIS) TRANSFORAMINAL LUMBAR INTERBODY FUSION (TLIF) 2 LEVEL (Back) APPLICATION OF INTRAOPERATIVE CT SCAN (Back)  Patient location during evaluation: PACU Anesthesia Type: General Level of consciousness: awake and alert Pain management: pain level controlled Vital Signs Assessment: post-procedure vital signs reviewed and stable Respiratory status: spontaneous breathing, nonlabored ventilation, respiratory function stable and patient connected to nasal cannula oxygen Cardiovascular status: blood pressure returned to baseline and stable Postop Assessment: no apparent nausea or vomiting Anesthetic complications: no   No notable events documented.   Last Vitals:  Vitals:   01/03/24 1900 01/03/24 1958  BP: (!) 159/78 (!) 151/79  Pulse: 61 (!) 59  Resp: 16 14  Temp:  (!) 36.1 C  SpO2: 100% 96%    Last Pain:  Vitals:   01/03/24 2215  TempSrc:   PainSc: 7                  Lenard Simmer

## 2024-01-04 ENCOUNTER — Encounter: Payer: Self-pay | Admitting: Neurosurgery

## 2024-01-04 LAB — GLUCOSE, CAPILLARY
Glucose-Capillary: 134 mg/dL — ABNORMAL HIGH (ref 70–99)
Glucose-Capillary: 164 mg/dL — ABNORMAL HIGH (ref 70–99)
Glucose-Capillary: 175 mg/dL — ABNORMAL HIGH (ref 70–99)

## 2024-01-04 MED ORDER — AMLODIPINE BESYLATE 5 MG PO TABS
ORAL_TABLET | ORAL | Status: AC
  Filled 2024-01-04: qty 1

## 2024-01-04 MED ORDER — ONDANSETRON HCL 4 MG/2ML IJ SOLN
INTRAMUSCULAR | Status: AC
  Filled 2024-01-04: qty 2

## 2024-01-04 NOTE — Evaluation (Signed)
 Occupational Therapy Evaluation Patient Details Name: Kristen Kirk MRN: [id number] DOB: 28-May-1951 Today's Date: 01/04/2024   History of Present Illness   Pt is a 73 y.o. female s/p transforaminal lumbar interbody fusion on 01/03/24. PMH significant of kidney transplantation on immunosuppressants, HTN, HLD, diet-controlled diabetes, GERD, chronic fecal incontinence (s/p of sacral neurostimulator), anemia, CKD-3A, memory impairment     Clinical Impressions PTA, pt lives with spouse and performs ADLs/mobility MOD I with use of SPC. Pt s/p above surgery. Pt alert to self and general information, but presents with decreased functional cognition and deficits in executive functioning. Pt unable to recall working with PT less than an hour earlier, and requires simple, 1-step commands for task performance. OT provides verbal and visual demo of AE for LB dressing, and pt able to return demo doffing/donning socks and underwear with MIN A when provided with cues for step-by-step sequencing. Pt educated in back precautions with handout provided, self care skills, bed mobility and functional transfer training, AE/DME for ADL needs, home/routines modifications and falls prevention strategies to maximize safety and functional independence. Attempted t/f bathroom with RW, pt limited by lethargy and onset of lightheadedness after walking ~5 ft. BP assessed 165/71 (98) while seated in recliner, RN in room and made aware. Checked BP again several minutes later 133/72 (89). Pt dozing off while upright in recliner, session limited by lethargy.   Pt would benefit from skilled OT services to address noted impairments and functional limitations (see below for any additional details) in order to maximize safety and independence while minimizing falls risk and caregiver burden. Anticipate the need for follow up [facility] OT services upon acute hospital DC. Given pt's cognitive and physical status, pt unsafe to return home without  24/7 physical assist and support from spouse.        If plan is discharge home, recommend the following:   A little help with walking and/or transfers;A little help with bathing/dressing/bathroom;Direct supervision/assist for financial management;Direct supervision/assist for medications management;Assist for transportation;Help with stairs or ramp for entrance;Supervision due to cognitive status     Functional Status Assessment   Patient has had a recent decline in their functional status and demonstrates the ability to make significant improvements in function in a reasonable and predictable amount of time.     Equipment Recommendations   BSC/3in1      Precautions/Restrictions   Precautions Precautions: Fall;Back Recall of Precautions/Restrictions: Impaired Precaution/Restrictions Comments: no brace needed Restrictions Weight Bearing Restrictions Per Provider Order: No     Mobility Bed Mobility               General bed mobility comments: NT, recieved up in the recliner and left in recliner    Transfers Overall transfer level: Needs assistance Equipment used: Rolling walker (2 wheels) Transfers: Sit to/from Stand Sit to Stand: Contact guard assist                  Balance Overall balance assessment: Needs assistance Sitting-balance support: Feet supported Sitting balance-Leahy Scale: Good     Standing balance support: Bilateral upper extremity supported, During functional activity Standing balance-Leahy Scale: Fair Standing balance comment: needs hands on assist and RW for safety                           ADL either performed or assessed with clinical judgement   ADL Overall ADL's : Needs assistance/impaired  Lower Body Dressing: With adaptive equipment;Cueing for sequencing;Cueing for back precautions;Sit to/from stand;Minimal assistance Lower Body Dressing Details (indicate cue type and reason):  doffs/dons socks with LB AE; requires simple, 1 step cuing for use. dons underwear with MIN A to sequence and adhere to back preacutions, MIN A to pull over hips             Functional mobility during ADLs: Rolling walker (2 wheels);Cueing for sequencing;Minimal assistance        Pertinent Vitals/Pain Pain Assessment Pain Assessment: 0-10 Pain Score: 5  Pain Location: back/right side Pain Descriptors / Indicators: Discomfort, Sore Pain Intervention(s): Limited activity within patient's tolerance, Monitored during session, RN gave pain meds during session     Extremity/Trunk Assessment Upper Extremity Assessment Upper Extremity Assessment: Overall WFL for tasks assessed   Lower Extremity Assessment Lower Extremity Assessment: Generalized weakness   Cervical / Trunk Assessment Cervical / Trunk Assessment: Back Surgery   Communication Communication Communication: No apparent difficulties   Cognition Arousal: Lethargic Behavior During Therapy: WFL for tasks assessed/performed Cognition: Cognition impaired     Awareness: Intellectual awareness impaired Memory impairment (select all impairments): Short-term memory, Working memory Attention impairment (select first level of impairment): Focused attention Executive functioning impairment (select all impairments): Sequencing, Reasoning, Problem solving, Organization OT - Cognition Comments: Pt with STM deficits, unable to recall working with PT less than an hour earlier. Requires simple, 1 step commands throughout                 Following commands: Impaired Following commands impaired: Follows one step commands with increased time     Cueing  General Comments   Cueing Techniques: Verbal cues;Tactile cues;Visual cues   Secure chat sent to update care team on pt performance.            Home Living Family/patient expects to be discharged to:: Private residence Living Arrangements: Spouse/significant  other Available Help at Discharge: Family;Available 24 hours/day Type of Home: House Home Access: Stairs to enter [employer] of Steps: 2 with rail   Home Layout: One level     Bathroom Shower/Tub: Chief Strategy Officer: Standard     Home Equipment: Cane - single point   Additional Comments: states she has a walker, but unsure the condition of it.      Prior Functioning/Environment Prior Level of Function : Independent/Modified Independent;Driving             Mobility Comments: using cane for mobility ADLs Comments: independent    OT Problem List: Decreased knowledge of use of DME or AE;Decreased knowledge of precautions;Decreased activity tolerance;Decreased strength;Impaired balance (sitting and/or standing);Decreased coordination;Decreased cognition;Decreased range of motion   OT Treatment/Interventions: Self-care/ADL training;Therapeutic exercise;Neuromuscular education;Energy conservation;Therapeutic activities;Patient/family education;Balance training;Cognitive remediation/compensation;DME and/or AE instruction      OT Goals(Current goals can be found in the care plan section)   Acute Rehab OT Goals OT Goal Formulation: With patient Time For Goal Achievement: 01/18/24 Potential to Achieve Goals: Fair   OT Frequency:  Min 3X/week       AM-PAC OT "6 Clicks" Daily Activity     Outcome Measure Help from another person eating meals?: None Help from another person taking care of personal grooming?: None Help from another person toileting, which includes using toliet, bedpan, or urinal?: A Little Help from another person bathing (including washing, rinsing, drying)?: A Little Help from another person to put on and taking off regular upper body clothing?: A Little Help from another person to  put on and taking off regular lower body clothing?: A Little 6 Click Score: 20   End of Session Equipment Utilized During Treatment: Gait  belt;Rolling walker (2 wheels) Nurse Communication: Mobility status  Activity Tolerance: Patient limited by lethargy Patient left: in chair;with call bell/phone within reach;with nursing/sitter in room  OT Visit Diagnosis: Unsteadiness on feet (R26.81);Muscle weakness (generalized) (M62.81);Other abnormalities of gait and mobility (R26.89)                Time: 2841-3244 OT Time Calculation (min): 33 min Charges:  OT General Charges $OT Visit: 1 Visit OT Evaluation $OT Eval Low Complexity: 1 Low OT Treatments $Self Care/Home Management : 8-22 mins  Delberta Folts L. Shakil Dirk, OTR/L  01/04/24, 1:27 PM

## 2024-01-04 NOTE — Progress Notes (Signed)
 Bedside report given to Aurea Graff, RN on 1A.

## 2024-01-04 NOTE — Progress Notes (Signed)
 Transferred pt to room 159.

## 2024-01-04 NOTE — Progress Notes (Signed)
   Neurosurgery Progress Note  History: Kristen Kirk is s/p L4-S1 MIS TLIF.   POD1: Pt doing well this morning with minimal pain  Physical Exam: Vitals:   01/04/24 0259 01/04/24 0729  BP: 138/70 133/65  Pulse: 62 64  Resp: 16 13  Temp: (!) 97 F (36.1 C) (!) 97.2 F (36.2 C)  SpO2: 100% 97%    AA Ox3 CNI  Strength:5/5 throughout BLE   Data:  Other tests/results: NA  Assessment/Plan:  Kristen Kirk is a 73 y.o presenting with symptomatic lumbar stenosis s/p L4-S1 MIS TLIF.   - mobilize - pain control - DVT prophylaxis - PTOT; dispo planning pending recommendations  Manning Charity PA-C Department of Neurosurgery

## 2024-01-04 NOTE — Evaluation (Signed)
 Physical Therapy Evaluation Patient Details Name: Kristen Kirk MRN: [id number] DOB: 1951-09-27 Today's Date: 01/04/2024  History of Present Illness  Kristen Kirk is s/p L4-S1 MIS TLIF.  Clinical Impression  Patient received in bed sleeping, she wakes easily and is agreeable to PT assessment. Patient performed bed mobility with cga. Transfers with cga and cues for safe hand placement. Patient is able to ambulate ~ 60 feet with RW. Slow pace with reports of right side pain. Weakness noted in B LEs with mobility. She will continue to benefit from skilled PT to improve mobility and safety prior to returning home.           If plan is discharge home, recommend the following: A little help with walking and/or transfers;A little help with bathing/dressing/bathroom;Assist for transportation;Help with stairs or ramp for entrance;Assistance with cooking/housework   Can travel by private vehicle    yes    Equipment Recommendations Rolling walker (2 wheels)  Recommendations for Other Services       Functional Status Assessment Patient has had a recent decline in their functional status and demonstrates the ability to make significant improvements in function in a reasonable and predictable amount of time.     Precautions / Restrictions Precautions Precautions: Fall Recall of Precautions/Restrictions: Impaired Precaution/Restrictions Comments: no brace needed Restrictions Weight Bearing Restrictions Per Provider Order: No      Mobility  Bed Mobility Overal bed mobility: Needs Assistance Bed Mobility: Supine to Sit     Supine to sit: Contact guard          Transfers Overall transfer level: Needs assistance Equipment used: Rolling walker (2 wheels) Transfers: Sit to/from Stand Sit to Stand: Contact guard assist                Ambulation/Gait Ambulation/Gait assistance: Contact guard assist Gait Distance (Feet): 60 Feet Assistive device: Rolling walker (2  wheels) Gait Pattern/deviations: Step-to pattern, Decreased step length - right, Decreased step length - left, Decreased stride length Gait velocity: decr     General Gait Details: patient ambulated ~60 feet with reports of R LE pain. Leg weakness during ambulation. CGA  Stairs            Wheelchair Mobility     Tilt Bed    Modified Rankin (Stroke Patients Only)       Balance Overall balance assessment: Needs assistance Sitting-balance support: Feet supported Sitting balance-Leahy Scale: Good     Standing balance support: Bilateral upper extremity supported, During functional activity Standing balance-Leahy Scale: Fair Standing balance comment: needs hands on assist and RW for safety                             Pertinent Vitals/Pain Pain Assessment Pain Assessment: 0-10 Pain Score: 4  Pain Location: back/right side Pain Descriptors / Indicators: Discomfort, Sore Pain Intervention(s): Monitored during session, Repositioned    Home Living Family/patient expects to be discharged to:: Private residence Living Arrangements: Spouse/significant other Available Help at Discharge: Family;Available 24 hours/day Type of Home: House Home Access: Stairs to enter   [employer] of Steps: 2 with rail   Home Layout: One level Home Equipment: Cane - single point Additional Comments: states she has a walker, but unsure the condition of it.    Prior Function Prior Level of Function : Independent/Modified Independent;Driving             Mobility Comments: using cane for mobility ADLs Comments: independent  Extremity/Trunk Assessment   Upper Extremity Assessment Upper Extremity Assessment: Defer to OT evaluation    Lower Extremity Assessment Lower Extremity Assessment: Generalized weakness    Cervical / Trunk Assessment Cervical / Trunk Assessment: Back Surgery  Communication   Communication Communication: No apparent  difficulties    Cognition Arousal: Lethargic Behavior During Therapy: WFL for tasks assessed/performed   PT - Cognitive impairments: No apparent impairments                         Following commands: Intact       Cueing Cueing Techniques: Verbal cues, Gestural cues     General Comments      Exercises     Assessment/Plan    PT Assessment Patient needs continued PT services  PT Problem List Decreased strength;Decreased range of motion;Decreased activity tolerance;Decreased balance;Decreased mobility;Decreased safety awareness;Decreased knowledge of precautions;Pain       PT Treatment Interventions DME instruction;Gait training;Stair training;Functional mobility training;Therapeutic activities;Therapeutic exercise;Balance training;Neuromuscular re-education;Patient/family education    PT Goals (Current goals can be found in the Care Plan section)  Acute Rehab PT Goals Patient Stated Goal: return home PT Goal Formulation: With patient Time For Goal Achievement: 01/11/24 Potential to Achieve Goals: Good    Frequency 7X/week     Co-evaluation               AM-PAC PT "6 Clicks" Mobility  Outcome Measure Help needed turning from your back to your side while in a flat bed without using bedrails?: A Little Help needed moving from lying on your back to sitting on the side of a flat bed without using bedrails?: A Little Help needed moving to and from a bed to a chair (including a wheelchair)?: A Little Help needed standing up from a chair using your arms (e.g., wheelchair or bedside chair)?: A Little Help needed to walk in hospital room?: A Little Help needed climbing 3-5 steps with a railing? : A Little 6 Click Score: 18    End of Session Equipment Utilized During Treatment: Gait belt Activity Tolerance: Patient limited by fatigue Patient left: in chair;with call bell/phone within reach;with nursing/sitter in room Nurse Communication: Mobility status PT  Visit Diagnosis: Other abnormalities of gait and mobility (R26.89);Muscle weakness (generalized) (M62.81);Difficulty in walking, not elsewhere classified (R26.2);Unsteadiness on feet (R26.81);Pain Pain - Right/Left: Right Pain - part of body: Leg    Time: 1610-9604 PT Time Calculation (min) (ACUTE ONLY): 16 min   Charges:   PT Evaluation $PT Eval Moderate Complexity: 1 Mod   PT General Charges $$ ACUTE PT VISIT: 1 Visit         Jesusa Stenerson, PT, GCS 01/04/24,10:12 AM

## 2024-01-04 NOTE — Plan of Care (Signed)
  Problem: Education: Goal: Ability to describe self-care measures that may prevent or decrease complications (Diabetes Survival Skills Education) will improve Outcome: Progressing Goal: Individualized Educational Video(s) Outcome: Progressing   Problem: Coping: Goal: Ability to adjust to condition or change in health will improve Outcome: Progressing   Problem: Fluid Volume: Goal: Ability to maintain a balanced intake and output will improve Outcome: Progressing   Problem: Health Behavior/Discharge Planning: Goal: Ability to identify and utilize available resources and services will improve Outcome: Progressing Goal: Ability to manage health-related needs will improve Outcome: Progressing   Problem: Metabolic: Goal: Ability to maintain appropriate glucose levels will improve Outcome: Progressing   Problem: Nutritional: Goal: Maintenance of adequate nutrition will improve Outcome: Progressing Goal: Progress toward achieving an optimal weight will improve Outcome: Progressing   Problem: Skin Integrity: Goal: Risk for impaired skin integrity will decrease Outcome: Progressing   Problem: Tissue Perfusion: Goal: Adequacy of tissue perfusion will improve Outcome: Progressing   Problem: Education: Goal: Ability to verbalize activity precautions or restrictions will improve Outcome: Progressing Goal: Knowledge of the prescribed therapeutic regimen will improve Outcome: Progressing Goal: Understanding of discharge needs will improve Outcome: Progressing   Problem: Activity: Goal: Ability to avoid complications of mobility impairment will improve Outcome: Progressing Goal: Ability to tolerate increased activity will improve Outcome: Progressing Goal: Will remain free from falls Outcome: Progressing   Problem: Bowel/Gastric: Goal: Gastrointestinal status for postoperative course will improve Outcome: Progressing   Problem: Clinical Measurements: Goal: Ability to  maintain clinical measurements within normal limits will improve Outcome: Progressing Goal: Postoperative complications will be avoided or minimized Outcome: Progressing Goal: Diagnostic test results will improve Outcome: Progressing   Problem: Pain Management: Goal: Pain level will decrease Outcome: Progressing   Problem: Skin Integrity: Goal: Will show signs of wound healing Outcome: Progressing   Problem: Health Behavior/Discharge Planning: Goal: Identification of resources available to assist in meeting health care needs will improve Outcome: Progressing   Problem: Bladder/Genitourinary: Goal: Urinary functional status for postoperative course will improve Outcome: Progressing   Problem: Education: Goal: Knowledge of General Education information will improve Description: Including pain rating scale, medication(s)/side effects and non-pharmacologic comfort measures Outcome: Progressing   Problem: Health Behavior/Discharge Planning: Goal: Ability to manage health-related needs will improve Outcome: Progressing   Problem: Clinical Measurements: Goal: Ability to maintain clinical measurements within normal limits will improve Outcome: Progressing Goal: Will remain free from infection Outcome: Progressing Goal: Diagnostic test results will improve Outcome: Progressing Goal: Respiratory complications will improve Outcome: Progressing Goal: Cardiovascular complication will be avoided Outcome: Progressing   Problem: Activity: Goal: Risk for activity intolerance will decrease Outcome: Progressing   Problem: Nutrition: Goal: Adequate nutrition will be maintained Outcome: Progressing   Problem: Coping: Goal: Level of anxiety will decrease Outcome: Progressing   Problem: Elimination: Goal: Will not experience complications related to bowel motility Outcome: Progressing Goal: Will not experience complications related to urinary retention Outcome: Progressing    Problem: Pain Managment: Goal: General experience of comfort will improve and/or be controlled Outcome: Progressing   Problem: Safety: Goal: Ability to remain free from injury will improve Outcome: Progressing

## 2024-01-04 NOTE — Plan of Care (Signed)
 Documented

## 2024-01-04 NOTE — Progress Notes (Signed)
 Physical Therapy Treatment Patient Details Name: Kristen Kirk MRN: [id number] DOB: Jun 15, 1951 Today's Date: 01/04/2024   History of Present Illness Pt is a 73 y.o. female s/p transforaminal lumbar interbody fusion on 01/03/24. PMH significant of kidney transplantation on immunosuppressants, HTN, HLD, diet-controlled diabetes, GERD, chronic fecal incontinence (s/p of sacral neurostimulator), anemia, CKD-3A, memory impairment    PT Comments  Patient received in bed sleeping. She has decreased stm. Patient reports 7/10 pain with movement of R LE to get out of bed. She requires cues for safety and cga for sit to stand from bed. She is able to ambulate ~ 60 feet with RW and cga. Slow pace. Limited by pain and fatigue. She will continue to benefit from skilled PT to improve safety and independence.        If plan is discharge home, recommend the following: A little help with walking and/or transfers;A little help with bathing/dressing/bathroom;Assist for transportation;Help with stairs or ramp for entrance;Assistance with cooking/housework   Can travel by private vehicle      yes  Equipment Recommendations  Rolling walker (2 wheels)    Recommendations for Other Services       Precautions / Restrictions Precautions Precautions: Fall;Back Recall of Precautions/Restrictions: Impaired Precaution/Restrictions Comments: no brace needed Restrictions Weight Bearing Restrictions Per Provider Order: No     Mobility  Bed Mobility Overal bed mobility: Modified Independent Bed Mobility: Supine to Sit     Supine to sit: Modified independent (Device/Increase time), Used rails, HOB elevated          Transfers Overall transfer level: Needs assistance Equipment used: Rolling walker (2 wheels) Transfers: Sit to/from Stand Sit to Stand: Contact guard assist           General transfer comment: Cues for hand placement, safety    Ambulation/Gait Ambulation/Gait assistance: Contact guard  assist Gait Distance (Feet): 60 Feet Assistive device: Rolling walker (2 wheels) Gait Pattern/deviations: Step-to pattern, Decreased step length - right, Decreased step length - left, Decreased stride length Gait velocity: decr     General Gait Details: patient ambulated ~60 feet with reports of R LE pain. Leg weakness during ambulation. CGA. Reports mild dizziness and 7/10 pain   Stairs             Wheelchair Mobility     Tilt Bed    Modified Rankin (Stroke Patients Only)       Balance Overall balance assessment: Needs assistance Sitting-balance support: Feet supported Sitting balance-Leahy Scale: Good     Standing balance support: Bilateral upper extremity supported, During functional activity, Reliant on assistive device for balance Standing balance-Leahy Scale: Fair Standing balance comment: needs hands on assist and RW for safety                            Communication Communication Communication: No apparent difficulties  Cognition Arousal: Lethargic Behavior During Therapy: WFL for tasks assessed/performed   PT - Cognitive impairments: History of cognitive impairments, Memory                         Following commands: Impaired Following commands impaired: Follows one step commands with increased time    Cueing Cueing Techniques: Verbal cues  Exercises Total Joint Exercises Ankle Circles/Pumps: AROM, Both, 5 reps Gluteal Sets: AROM, Both, 5 reps Hip ABduction/ADduction: AAROM, Both, 5 reps    General Comments        Pertinent Vitals/Pain Pain  Assessment Pain Assessment: 0-10 Pain Score: 7  Pain Location: back/right side Pain Descriptors / Indicators: Discomfort, Sore Pain Intervention(s): Monitored during session, Premedicated before session    Home Living Family/patient expects to be discharged to:: Private residence Living Arrangements: Spouse/significant other Available Help at Discharge: Family;Available 24  hours/day Type of Home: House Home Access: Stairs to enter   [employer] of Steps: 2 with rail   Home Layout: One level Home Equipment: Cane - single point Additional Comments: states she has a walker, but unsure the condition of it.    Prior Function            PT Goals (current goals can now be found in the care plan section) Acute Rehab PT Goals Patient Stated Goal: return home PT Goal Formulation: With patient Time For Goal Achievement: 01/11/24 Potential to Achieve Goals: Good Progress towards PT goals: Progressing toward goals    Frequency    7X/week      PT Plan      Co-evaluation              AM-PAC PT "6 Clicks" Mobility   Outcome Measure  Help needed turning from your back to your side while in a flat bed without using bedrails?: A Little Help needed moving from lying on your back to sitting on the side of a flat bed without using bedrails?: A Little Help needed moving to and from a bed to a chair (including a wheelchair)?: A Little Help needed standing up from a chair using your arms (e.g., wheelchair or bedside chair)?: A Little Help needed to walk in hospital room?: A Little Help needed climbing 3-5 steps with a railing? : A Little 6 Click Score: 18    End of Session Equipment Utilized During Treatment: Gait belt Activity Tolerance: Patient limited by fatigue;Patient limited by pain Patient left: in chair;with call bell/phone within reach;with nursing/sitter in room Nurse Communication: Mobility status PT Visit Diagnosis: Other abnormalities of gait and mobility (R26.89);Muscle weakness (generalized) (M62.81);Difficulty in walking, not elsewhere classified (R26.2);Unsteadiness on feet (R26.81);Pain Pain - Right/Left: Right Pain - part of body: Leg     Time: 1610-9604 PT Time Calculation (min) (ACUTE ONLY): 10 min  Charges:    $Gait Training: 8-22 mins PT General Charges $$ ACUTE PT VISIT: 1 Visit                      Laszlo Ellerby, PT, [facility]CS 01/04/24,1:25 PM

## 2024-01-04 NOTE — Discharge Instructions (Signed)
   Your surgeon has performed an operation on your lumbar spine (low back) to relieve pressure on one or more nerves. Many times, patients feel better immediately after surgery and can "overdo it." Even if you feel well, it is important that you follow these activity guidelines. If you do not let your back heal properly from the surgery, you can increase the chance of hardware complications and/or return of your symptoms. The following are instructions to help in your recovery once you have been discharged from the hospital.  Do not use NSAIDs for 3 months after surgery.   Activity    No bending, lifting, or twisting ("BLT"). Avoid lifting objects heavier than 10 pounds (gallon milk jug).  Where possible, avoid household activities that involve lifting, bending, pushing, or pulling such as laundry, vacuuming, grocery shopping, and childcare. Try to arrange for help from friends and family for these activities while your back heals.  Increase physical activity slowly as tolerated.  Taking short walks is encouraged, but avoid strenuous exercise. Do not jog, run, bicycle, lift weights, or participate in any other exercises unless specifically allowed by your doctor. Avoid prolonged sitting, including car rides.  Talk to your doctor before resuming sexual activity.  You should not drive until cleared by your doctor.  Until released by your doctor, you should not return to work or school.  You should rest at home and let your body heal.   You may shower three days after your surgery.  After showering, lightly dab your incision dry. Do not take a tub bath or go swimming for 3 weeks, or until approved by your doctor at your follow-up appointment.  If you smoke, we strongly recommend that you quit.  Smoking has been proven to interfere with normal healing in your back and will dramatically reduce the success rate of your surgery. Please contact [facility] (800-QUIT-NOW) and use the resources at  www.[facility].com for assistance in stopping smoking.  Surgical Incision   If you have a dressing on your incision, you may remove it three days after your surgery. Keep your incision area clean and dry.  Your incision was closed with Dermabond glue. The glue should begin to peel away within about a week.  Diet            You may return to your usual diet. Be sure to stay hydrated.  When to Contact us  Although your surgery and recovery will likely be uneventful, you may have some residual numbness, aches, and pains in your back and/or legs. This is normal and should improve in the next few weeks.  However, should you experience any of the following, contact us immediately: New numbness or weakness Pain that is progressively getting worse, and is not relieved by your pain medications or rest Bleeding, redness, swelling, pain, or drainage from surgical incision Chills or flu-like symptoms Fever greater than 101.0 F (38.3 C) Problems with bowel or bladder functions Difficulty breathing or shortness of breath Warmth, tenderness, or swelling in your calf  Contact Information How to contact us:  If you have any questions/concerns before or after surgery, you can reach [place] at [phone], or you can send a mychart message. We can be reached by phone or mychart 8am-4pm, Monday-Friday.  *Please note: Calls after 4pm are forwarded to a third party answering service. Mychart messages are not routinely monitored during evenings, weekends, and holidays. Please call our office to contact the answering service for urgent concerns during non-business hours.

## 2024-01-04 NOTE — Progress Notes (Signed)
 Patient states that neurosurgeon advised her to oly take 250 mg of cellcept. Order is for 500 mg. Reached out to neurosurgeon to clarify order. Still waiting for response. Left contact information as well. Patient states that is is due to her kidneys. Patient has recent history of kidney transplant. Will continue to monitor.

## 2024-01-05 LAB — GLUCOSE, CAPILLARY
Glucose-Capillary: 164 mg/dL — ABNORMAL HIGH (ref 70–99)
Glucose-Capillary: 161 mg/dL — ABNORMAL HIGH (ref 70–99)
Glucose-Capillary: 219 mg/dL — ABNORMAL HIGH (ref 70–99)
Glucose-Capillary: 177 mg/dL — ABNORMAL HIGH (ref 70–99)

## 2024-01-05 MED ORDER — MYCOPHENOLATE MOFETIL 250 MG PO CAPS
250.0000 mg | ORAL_CAPSULE | Freq: Two times a day (BID) | ORAL | Status: DC
  Administered 2024-01-05 – 2024-01-09 (×9): 250 mg via ORAL
  Filled 2024-01-05 (×8): qty 1

## 2024-01-05 NOTE — Progress Notes (Signed)
 Occupational Therapy Treatment Patient Details Name: Kristen Kirk MRN: [id number] DOB: [birth year] Today's Date: 01/05/2024   History of present illness Pt is a 73 y.o. female s/p transforaminal lumbar interbody fusion on 01/03/24. PMH significant of kidney transplantation on immunosuppressants, HTN, HLD, diet-controlled diabetes, GERD, chronic fecal incontinence (s/p of sacral neurostimulator), anemia, CKD-3A, memory impairment   OT comments  Chart reviewed to date, pt greeted in bed with aunt present, alert and oriented x4, increased time for one step direction following. Pt requires increased assist for mobility on this date requiring MOD A for log rolling, MIN-MOD A for multiple STS with B knees buckling on each attempts. Grooming tasks attempted in sitting with pt with posterior lean with fatigue requiring MIN A to remain upright. Dizziness reported in all positions, worse when upright but pt reports feeling dizzy throughout. Nurse in room to assess, vitals WNL. Discussed discharge recommendations with pt/aunt, OT will continue to follow to acute to address deficits and to facilitate optimal ADL performance.       If plan is discharge home, recommend the following:  Direct supervision/assist for financial management;Direct supervision/assist for medications management;Assist for transportation;Help with stairs or ramp for entrance;Supervision due to cognitive status;A lot of help with walking and/or transfers;A lot of help with bathing/dressing/bathroom   Equipment Recommendations  BSC/3in1    Recommendations for Other Services      Precautions / Restrictions Precautions Precautions: Fall;Back Recall of Precautions/Restrictions: Impaired Precaution/Restrictions Comments: no brace needed Restrictions Weight Bearing Restrictions Per Provider Order: No       Mobility Bed Mobility Overal bed mobility: Needs Assistance Bed Mobility: Rolling, Sidelying to Sit, Sit to  Sidelying Rolling: Min assist, Used rails Sidelying to sit: Mod assist     Sit to sidelying: Mod assist General bed mobility comments: frequent vcs for technique    Transfers Overall transfer level: Needs assistance Equipment used: Rolling walker (2 wheels) Transfers: Sit to/from Stand Sit to Stand: Min assist, Mod assist           General transfer comment: multiple attempts, pt B knees buckling each attempt, unable to come to full upright;     Balance Overall balance assessment: Needs assistance Sitting-balance support: Feet supported Sitting balance-Leahy Scale: Poor Sitting balance - Comments: Pt with posterior lean after fatigue Postural control: Posterior lean Standing balance support: Bilateral upper extremity supported, During functional activity, Reliant on assistive device for balance Standing balance-Leahy Scale: Poor                             ADL either performed or assessed with clinical judgement   ADL Overall ADL's : Needs assistance/impaired     Grooming: Oral care;Sitting               Lower Body Dressing: With adaptive equipment;Cueing for sequencing;Cueing for back precautions;Sit to/from stand;Moderate assistance               Functional mobility during ADLs: Rolling walker (2 wheels);Cueing for sequencing;Moderate assistance (attempted steps forward pt with buckling, unsafe to progress at this time)      Extremity/Trunk Assessment              Vision       Perception     Praxis     Communication Communication Communication: No apparent difficulties   Cognition Arousal: Alert Behavior During Therapy: WFL for tasks assessed/performed Cognition: Cognition impaired     Awareness: Intellectual awareness impaired Memory  impairment (select all impairments): Short-term memory, Working memory Attention impairment (select first level of impairment): Sustained attention Executive functioning impairment (select all  impairments): Sequencing, Reasoning, Problem solving, Organization                   Following commands: Impaired Following commands impaired: Follows one step commands with increased time      Cueing   Cueing Techniques: Verbal cues  Exercises Other Exercises Other Exercises: edu re: role of OT, role of rehab, discharge recommednations, importance of progressing mobility    Shoulder Instructions       General Comments Vital signs taken after mobility appear WFL; nurse in room post mobility    Pertinent Vitals/ Pain       Pain Assessment Pain Assessment: 0-10 Pain Score: 7  Pain Location: back Pain Descriptors / Indicators: Discomfort, Sore Pain Intervention(s): Limited activity within patient's tolerance, Monitored during session, Premedicated before session  Home Living                                          Prior Functioning/Environment              Frequency  Min 3X/week        Progress Toward Goals  OT Goals(current goals can now be found in the care plan section)  Progress towards OT goals: Progressing toward goals  Acute Rehab OT Goals Time For Goal Achievement: 01/18/24  Plan      Co-evaluation                 AM-PAC OT "6 Clicks" Daily Activity     Outcome Measure   Help from another person eating meals?: None Help from another person taking care of personal grooming?: A Little Help from another person toileting, which includes using toliet, bedpan, or urinal?: A Lot Help from another person bathing (including washing, rinsing, drying)?: A Lot Help from another person to put on and taking off regular upper body clothing?: A Little Help from another person to put on and taking off regular lower body clothing?: A Lot 6 Click Score: 16    End of Session Equipment Utilized During Treatment: Gait belt;Rolling walker (2 wheels)  OT Visit Diagnosis: Unsteadiness on feet (R26.81);Muscle weakness (generalized)  (M62.81);Other abnormalities of gait and mobility (R26.89)   Activity Tolerance Patient tolerated treatment well   Patient Left in bed;with call bell/phone within reach;with bed alarm set;with family/visitor present;with nursing/sitter in room   Nurse Communication Mobility status        Time: 1610-9604 OT Time Calculation (min): 32 min  Charges: OT General Charges $OT Visit: 1 Visit OT Treatments $Self Care/Home Management : 8-22 mins $Therapeutic Activity: 8-22 mins Oleta Mouse, OTD OTR/L  01/05/24, 1:29 PM

## 2024-01-05 NOTE — Progress Notes (Signed)
 PT Cancellation Note  Patient Details Name: Kristen Kirk MRN: [id number] DOB: [birth year]   Cancelled Treatment:     Author returned for a 2nd session when family was present to witness her abilities and assistance required. Pt currently endorses 8/10 pain and unwillingness to get OOB again. Discussed at length PT/OT recs for STR at DC. Pt/Family are in agreement now that STR is safest DC option. Pt's spouse requested 9am PT session tomorrow. Acute PT will continue to follow and progress per current POC. RN aware pt requesting ms relaxer at this time. She had recently (~45 minutes ago) had pain medication that she endorses is only making her sleepy but not helping with the pain.    [name] 01/05/2024, 3:35 PM

## 2024-01-05 NOTE — Progress Notes (Signed)
 Neurosurgery Progress Note   History: Kristen Kirk is s/p L4-S1 MIS TLIF.    POD1: Pt doing well this morning with minimal pain  POD2: doing well, but had some vertigo with PT/OT   Physical Exam:     Vitals:    01/04/24 0259 01/04/24 0729  BP: 138/70 133/65  Pulse: 62 64  Resp: 16 13  Temp: (!) 97 F (36.1 C) (!) 97.2 F (36.2 C)  SpO2: 100% 97%      AA Ox3 CNI   Strength:5/5 throughout BLE    Data:   Other tests/results: NA   Assessment/Plan:   Kristen Kirk is a 73 y.o presenting with symptomatic lumbar stenosis s/p L4-S1 MIS TLIF.    - mobilize - pain control - DVT prophylaxis - PTOT; dispo planning pending recommendations   Peterson Ao MD, PhD

## 2024-01-05 NOTE — Progress Notes (Addendum)
 Physical Therapy Treatment Patient Details Name: Kristen Kirk MRN: [id number] DOB: [birth year] Today's Date: 01/05/2024   History of Present Illness Pt is a 73 y.o. female s/p transforaminal lumbar interbody fusion on 01/03/24. PMH significant of kidney transplantation on immunosuppressants, HTN, HLD, diet-controlled diabetes, GERD, chronic fecal incontinence (s/p of sacral neurostimulator), anemia, CKD-3A, memory impairment    PT Comments  Pt was long sitting in bed upon arrival. She is alert and agreeable to session.  Pt required much more assistance today to safely exit bed, stand to RW several times EOB. Knees slightly flexed throughout standing with occasional buckling. Unsafe o advance to ambulate but was able to stand pivot to recliner with use of RW + author assisting with progression of RW. neurosurgery made aware of pt's change in abilities. DC recs updated to reflect pt's change in abilities. Acute PT will continue to follow per current POC.     If plan is discharge home, recommend the following: A lot of help with walking and/or transfers;A lot of help with bathing/dressing/bathroom;Assistance with cooking/housework;Direct supervision/assist for medications management;Direct supervision/assist for financial management;Assist for transportation;Help with stairs or ramp for entrance     Equipment Recommendations  Rolling walker (2 wheels)       Precautions / Restrictions Precautions Precautions: Fall;Back Recall of Precautions/Restrictions: Impaired Precaution/Restrictions Comments: no brace needed Restrictions Weight Bearing Restrictions Per Provider Order: No     Mobility  Bed Mobility Overal bed mobility: Needs Assistance Bed Mobility: Supine to Sit  Supine to sit: Mod assist  General bed mobility comments: pt required much more assistance to safely exit bed today versus previous date. neurosurgery made aware    Transfers Overall transfer level: Needs  assistance Equipment used: Rolling walker (2 wheels) Transfers: Sit to/from Stand Sit to Stand: Min assist, Mod assist, From elevated surface  General transfer comment: pt required more assistance to stand from elevated EOB surface. knees remain flexed with poor standing tolerance. UNsafe to ambulate however pt was able to stand pivot from elevated be height to recliner.    Ambulation/Gait  General Gait Details: unsafe to advance to gat this date to poor static standing tolerance. Slight knee buckling in standing   Balance Overall balance assessment: Needs assistance Sitting-balance support: Feet supported Sitting balance-Leahy Scale: Fair     Standing balance support: Bilateral upper extremity supported, During functional activity, Reliant on assistive device for balance Standing balance-Leahy Scale: Fair    Hotel manager: No apparent difficulties  Cognition Arousal: Alert Behavior During Therapy: WFL for tasks assessed/performed   PT - Cognitive impairments: History of cognitive impairments, Memory    PT - Cognition Comments: Pt is A and O x 3. Agreeable to session however poor awareness of deficits with poor insight of safety concerns with mobility Following commands: Intact Following commands impaired: Follows one step commands with increased time    Cueing Cueing Techniques: Verbal cues, Tactile cues         Pertinent Vitals/Pain Pain Assessment Pain Assessment: No/denies pain Pain Score: 0-No pain     PT Goals (current goals can now be found in the care plan section) Acute Rehab PT Goals Patient Stated Goal: return home Progress towards PT goals: Not progressing toward goals - comment (regression from previous date)    Frequency    7X/week       AM-PAC PT "6 Clicks" Mobility   Outcome Measure  Help needed turning from your back to your side while in a flat bed without using bedrails?: A  Lot Help needed moving from lying on  your back to sitting on the side of a flat bed without using bedrails?: A Lot Help needed moving to and from a bed to a chair (including a wheelchair)?: A Lot Help needed standing up from a chair using your arms (e.g., wheelchair or bedside chair)?: A Lot Help needed to walk in hospital room?: A Lot Help needed climbing 3-5 steps with a railing? : Total 6 Click Score: 11    End of Session   Activity Tolerance: Patient tolerated treatment well;Patient limited by fatigue Patient left: in chair;with call bell/phone within reach;with nursing/sitter in room;with chair alarm set Nurse Communication: Mobility status PT Visit Diagnosis: Other abnormalities of gait and mobility (R26.89);Muscle weakness (generalized) (M62.81);Difficulty in walking, not elsewhere classified (R26.2);Unsteadiness on feet (R26.81);Pain Pain - Right/Left: Right Pain - part of body: Leg     Time: 9147-8295 PT Time Calculation (min) (ACUTE ONLY): 23 min  Charges:    $Therapeutic Activity: 23-37 mins PT General Charges $$ ACUTE PT VISIT: 1 Visit                     Jetta Lout PTA 01/05/24, 1:24 PM

## 2024-01-05 NOTE — Progress Notes (Signed)
 Patient's daughter called to check on her and see if had gotten oxycodone. Was okay with 5mg  dose. Expressed hopes for patient to stay at least  for the weekend and will check on her later on as well. Will continue to monitor.

## 2024-01-05 NOTE — Plan of Care (Signed)
  Problem: Education: Goal: Ability to describe self-care measures that may prevent or decrease complications (Diabetes Survival Skills Education) will improve Outcome: Progressing Goal: Individualized Educational Video(s) Outcome: Progressing   Problem: Fluid Volume: Goal: Ability to maintain a balanced intake and output will improve Outcome: Progressing   Problem: Skin Integrity: Goal: Risk for impaired skin integrity will decrease Outcome: Progressing   Problem: Tissue Perfusion: Goal: Adequacy of tissue perfusion will improve Outcome: Progressing   Problem: Skin Integrity: Goal: Risk for impaired skin integrity will decrease Outcome: Progressing   Problem: Safety: Goal: Ability to remain free from injury will improve Outcome: Progressing   Problem: Pain Managment: Goal: General experience of comfort will improve and/or be controlled Outcome: Progressing   Problem: Nutrition: Goal: Adequate nutrition will be maintained Outcome: Progressing

## 2024-01-05 NOTE — Progress Notes (Signed)
 Neurology  just called back and said medication is through oncology. Will contact on call oncology. Will continue to monitor.

## 2024-01-06 LAB — GLUCOSE, CAPILLARY
Glucose-Capillary: 123 mg/dL — ABNORMAL HIGH (ref 70–99)
Glucose-Capillary: 209 mg/dL — ABNORMAL HIGH (ref 70–99)
Glucose-Capillary: 179 mg/dL — ABNORMAL HIGH (ref 70–99)
Glucose-Capillary: 184 mg/dL — ABNORMAL HIGH (ref 70–99)

## 2024-01-06 MED ORDER — MECLIZINE HCL 25 MG PO TABS
25.0000 mg | ORAL_TABLET | Freq: Two times a day (BID) | ORAL | Status: DC | PRN
  Administered 2024-01-06: 25 mg via ORAL
  Filled 2024-01-06 (×2): qty 1

## 2024-01-06 MED ORDER — ENSURE ENLIVE PO LIQD
237.0000 mL | Freq: Two times a day (BID) | ORAL | Status: DC
  Administered 2024-01-06 – 2024-01-09 (×6): 237 mL via ORAL

## 2024-01-06 NOTE — Progress Notes (Signed)
 History: Kristen Kirk is s/p L4-S1 MIS TLIF.    POD1: Pt doing well this morning with minimal pain   POD2: doing well, but had some vertigo with PT/OT  POD3: vertigo improved.  Some right lower extremity pain.   Physical Exam:        Vitals:    01/04/24 0259 01/04/24 0729  BP: 138/70 133/65  Pulse: 62 64  Resp: 16 13  Temp: (!) 97 F (36.1 C) (!) 97.2 F (36.2 C)  SpO2: 100% 97%      AA Ox3 CNI   Strength:5/5 throughout BLE    Data:   Other tests/results: NA   Assessment/Plan:   Kristen Kirk is a 73 y.o presenting with symptomatic lumbar stenosis s/p L4-S1 MIS TLIF.    - mobilize - pain control - DVT prophylaxis - PTOT; dispo planning pending recommendations   Peterson Ao MD, PhD

## 2024-01-06 NOTE — Plan of Care (Signed)
  Problem: Education: Goal: Ability to describe self-care measures that may prevent or decrease complications (Diabetes Survival Skills Education) will improve Outcome: Progressing Goal: Individualized Educational Video(s) Outcome: Progressing   Problem: Fluid Volume: Goal: Ability to maintain a balanced intake and output will improve Outcome: Progressing   Problem: Metabolic: Goal: Ability to maintain appropriate glucose levels will improve Outcome: Progressing   Problem: Nutritional: Goal: Maintenance of adequate nutrition will improve Outcome: Progressing Goal: Progress toward achieving an optimal weight will improve Outcome: Progressing   Problem: Skin Integrity: Goal: Risk for impaired skin integrity will decrease Outcome: Progressing   Problem: Tissue Perfusion: Goal: Adequacy of tissue perfusion will improve Outcome: Progressing   Problem: Bowel/Gastric: Goal: Gastrointestinal status for postoperative course will improve Outcome: Progressing   Problem: Activity: Goal: Ability to avoid complications of mobility impairment will improve Outcome: Progressing Goal: Ability to tolerate increased activity will improve Outcome: Progressing Goal: Will remain free from falls Outcome: Progressing   Problem: Pain Management: Goal: Pain level will decrease Outcome: Progressing   Problem: Skin Integrity: Goal: Will show signs of wound healing Outcome: Progressing

## 2024-01-06 NOTE — Progress Notes (Addendum)
 Physical Therapy Treatment Patient Details Name: Kristen Kirk MRN: [id number] DOB: [birth year] Today's Date: 01/06/2024   History of Present Illness Pt is a 73 y.o. female s/p transforaminal lumbar interbody fusion on 01/03/24. PMH significant of kidney transplantation on immunosuppressants, HTN, HLD, diet-controlled diabetes, GERD, chronic fecal incontinence (s/p of sacral neurostimulator), anemia, CKD-3A, memory impairment    PT Comments  Pt in bed, medicated and spouse in room.  She is able to get to EOB with bed features and increased time/cues with min a x 1.  She is generally steady in sitting today and able to assist with upper body bathing.  Stands for lower with min a x 1 for balance and +1 tech for care of small BM.  Initially she denied needing to use bathroom but then agrees and transfers to/from [facility] with min a x 1 and heavy cues as she does try to leave walker at the side.  She is able to progress gait today 25' x 2 with RW and min a x 1.  Mod verbal and hand over hand cues for hand placements for transfers.  Le's remains with general weakness with both knees flexed during gait but no buckling.  She does some weight shifting in standing before returning to recliner with needs met.  Pt with Meclazine prior to session.  Dizziness remains but she stated it is her baseline at home.  Pt continues to need increased assist with mobility vs baseline and husband voices concerns over his ability to assist and general weakness/fall risk.  Voiced agreement and both are open to <3 hours of therapy a day at discharge.   If plan is discharge home, recommend the following: A lot of help with walking and/or transfers;Assistance with cooking/housework;Direct supervision/assist for medications management;Direct supervision/assist for financial management;Assist for transportation;Help with stairs or ramp for entrance;A little help with bathing/dressing/bathroom   Can travel by private vehicle         Equipment Recommendations  Rolling walker (2 wheels)    Recommendations for Other Services       Precautions / Restrictions Precautions Precautions: Fall;Back Recall of Precautions/Restrictions: Impaired Precaution/Restrictions Comments: no brace needed Restrictions Weight Bearing Restrictions Per Provider Order: No     Mobility  Bed Mobility Overal bed mobility: Needs Assistance Bed Mobility: Supine to Sit     Supine to sit: Mod assist       Patient Response: Cooperative  Transfers Overall transfer level: Needs assistance Equipment used: Rolling walker (2 wheels) Transfers: Sit to/from Stand Sit to Stand: Min assist, Mod assist                Ambulation/Gait Ambulation/Gait assistance: Min assist Gait Distance (Feet): 25 Feet Assistive device: Rolling walker (2 wheels) Gait Pattern/deviations: Step-to pattern, Decreased step length - right, Decreased step length - left, Decreased stride length Gait velocity: decr     General Gait Details: able to walk to door and back today with seated rest,  +1 min assist and generally weak knees flexed during gait.  unsafe to walk unassisted   Stairs             Wheelchair Mobility     Tilt Bed Tilt Bed Patient Response: Cooperative  Modified Rankin (Stroke Patients Only)       Balance Overall balance assessment: Needs assistance Sitting-balance support: Feet supported Sitting balance-Leahy Scale: Fair Sitting balance - Comments: does well with supervision and able to participate in bath   Standing balance support: Bilateral upper extremity supported, During  functional activity, Reliant on assistive device for balance Standing balance-Leahy Scale: Poor Standing balance comment: needs hands on assist and RW for safety                            Communication Communication Communication: No apparent difficulties  Cognition Arousal: Alert Behavior During Therapy: WFL for tasks  assessed/performed   PT - Cognitive impairments: History of cognitive impairments, Memory                         Following commands: Impaired Following commands impaired: Follows one step commands with increased time    Cueing Cueing Techniques: Verbal cues  Exercises Other Exercises Other Exercises: to [facility] to void    General Comments        Pertinent Vitals/Pain Pain Assessment Pain Assessment: Faces Faces Pain Scale: Hurts little more Pain Location: back Pain Descriptors / Indicators: Discomfort, Sore Pain Intervention(s): Limited activity within patient's tolerance, Monitored during session, Premedicated before session, Repositioned    Home Living                          Prior Function            PT Goals (current goals can now be found in the care plan section) Progress towards PT goals: Progressing toward goals    Frequency    7X/week      PT Plan      Co-evaluation              AM-PAC PT "6 Clicks" Mobility   Outcome Measure  Help needed turning from your back to your side while in a flat bed without using bedrails?: A Lot Help needed moving from lying on your back to sitting on the side of a flat bed without using bedrails?: A Lot Help needed moving to and from a bed to a chair (including a wheelchair)?: A Little Help needed standing up from a chair using your arms (e.g., wheelchair or bedside chair)?: A Little Help needed to walk in hospital room?: A Little Help needed climbing 3-5 steps with a railing? : Total 6 Click Score: 14    End of Session Equipment Utilized During Treatment: Gait belt Activity Tolerance: Patient tolerated treatment well;Patient limited by fatigue Patient left: in chair;with call bell/phone within reach;with nursing/sitter in room;with chair alarm set Nurse Communication: Mobility status PT Visit Diagnosis: Other abnormalities of gait and mobility (R26.89);Muscle weakness (generalized)  (M62.81);Difficulty in walking, not elsewhere classified (R26.2);Unsteadiness on feet (R26.81);Pain Pain - Right/Left: Right Pain - part of body: Leg     Time: 9629-5284 PT Time Calculation (min) (ACUTE ONLY): 31 min  Charges:    $Gait Training: 8-22 mins $Therapeutic Activity: 8-22 mins PT General Charges $$ ACUTE PT VISIT: 1 Visit                   Danielle Dess, PTA 01/06/24, 10:59 AM

## 2024-01-07 ENCOUNTER — Encounter: Payer: Self-pay | Admitting: Neurosurgery

## 2024-01-07 ENCOUNTER — Encounter: Admitting: Physician Assistant

## 2024-01-07 LAB — GLUCOSE, CAPILLARY
Glucose-Capillary: 191 mg/dL — ABNORMAL HIGH (ref 70–99)
Glucose-Capillary: 150 mg/dL — ABNORMAL HIGH (ref 70–99)
Glucose-Capillary: 143 mg/dL — ABNORMAL HIGH (ref 70–99)
Glucose-Capillary: 213 mg/dL — ABNORMAL HIGH (ref 70–99)

## 2024-01-07 NOTE — Progress Notes (Signed)
 Physical Therapy Treatment Patient Details Name: Kristen Kirk MRN: [id number] DOB: April 10, 1951 Today's Date: 01/07/2024   History of Present Illness Pt is a 73 y.o. female s/p transforaminal lumbar interbody fusion on 01/03/24. PMH significant of kidney transplantation on immunosuppressants, HTN, HLD, diet-controlled diabetes, GERD, chronic fecal incontinence (s/p of sacral neurostimulator), anemia, CKD-3A, memory impairment    PT Comments  To EOB, min a x 1.  Denies needing to use bathroom.  She is able to walk 35', seated rest in hallway then 15' x 2 to bathroom to void where she is inc urine on the floor.  She is able to attend to her own care in sitting but doe struggle making it back to reliner due to general increase in fatigue and increased knee flexion in standing.  Remained up with needs met.     If plan is discharge home, recommend the following: A lot of help with walking and/or transfers;Assistance with cooking/housework;Direct supervision/assist for medications management;Direct supervision/assist for financial management;Assist for transportation;Help with stairs or ramp for entrance;A little help with bathing/dressing/bathroom   Can travel by private vehicle        Equipment Recommendations  Rolling walker (2 wheels)    Recommendations for Other Services       Precautions / Restrictions Precautions Precautions: Fall;Back Recall of Precautions/Restrictions: Impaired Precaution/Restrictions Comments: no brace needed Restrictions Weight Bearing Restrictions Per Provider Order: No     Mobility  Bed Mobility Overal bed mobility: Needs Assistance Bed Mobility: Supine to Sit     Supine to sit: Min assist       Patient Response: Cooperative  Transfers Overall transfer level: Needs assistance Equipment used: Rolling walker (2 wheels) Transfers: Sit to/from Stand Sit to Stand: Min assist                Ambulation/Gait Ambulation/Gait assistance: Armed forces technical officer (Feet): 35 Feet Assistive device: Rolling walker (2 wheels) Gait Pattern/deviations: Step-to pattern, Decreased step length - right, Decreased step length - left, Decreased stride length Gait velocity: decr     General Gait Details: able to progress gait slighlty but limited by general weakness and fatigue   Stairs             Wheelchair Mobility     Tilt Bed Tilt Bed Patient Response: Cooperative  Modified Rankin (Stroke Patients Only)       Balance Overall balance assessment: Needs assistance Sitting-balance support: Feet supported Sitting balance-Leahy Scale: Fair     Standing balance support: Bilateral upper extremity supported, During functional activity, Reliant on assistive device for balance Standing balance-Leahy Scale: Poor Standing balance comment: needs hands on assist and RW for safety                            Communication Communication Communication: No apparent difficulties  Cognition Arousal: Alert Behavior During Therapy: WFL for tasks assessed/performed   PT - Cognitive impairments: History of cognitive impairments, Memory                         Following commands: Impaired Following commands impaired: Follows one step commands with increased time    Cueing Cueing Techniques: Verbal cues  Exercises Other Exercises Other Exercises: to toilet to void    General Comments        Pertinent Vitals/Pain Pain Assessment Pain Assessment: Faces Faces Pain Scale: Hurts a little bit Pain Location: back Pain Descriptors /  Indicators: Discomfort, Sore Pain Intervention(s): Limited activity within patient's tolerance, Monitored during session, Premedicated before session, Repositioned    Home Living                          Prior Function            PT Goals (current goals can now be found in the care plan section) Progress towards PT goals: Progressing toward goals     Frequency    7X/week      PT Plan      Co-evaluation              AM-PAC PT "6 Clicks" Mobility   Outcome Measure  Help needed turning from your back to your side while in a flat bed without using bedrails?: A Little Help needed moving from lying on your back to sitting on the side of a flat bed without using bedrails?: A Lot Help needed moving to and from a bed to a chair (including a wheelchair)?: A Little Help needed standing up from a chair using your arms (e.g., wheelchair or bedside chair)?: A Little Help needed to walk in hospital room?: A Little Help needed climbing 3-5 steps with a railing? : Total 6 Click Score: 15    End of Session Equipment Utilized During Treatment: Gait belt Activity Tolerance: Patient tolerated treatment well;Patient limited by fatigue Patient left: in chair;with call bell/phone within reach;with chair alarm set Nurse Communication: Mobility status PT Visit Diagnosis: Other abnormalities of gait and mobility (R26.89);Muscle weakness (generalized) (M62.81);Difficulty in walking, not elsewhere classified (R26.2);Unsteadiness on feet (R26.81);Pain Pain - Right/Left: Right Pain - part of body: Leg     Time: 1610-9604 PT Time Calculation (min) (ACUTE ONLY): 18 min  Charges:    $Gait Training: 8-22 mins PT General Charges $$ ACUTE PT VISIT: 1 Visit                    Danielle Dess, PTA 01/07/24, 10:32 AM

## 2024-01-07 NOTE — Progress Notes (Signed)
 History: Kristen Kirk is s/p L4-S1 MIS TLIF.    POD1: Pt doing well this morning with minimal pain   POD2: doing well, but had some vertigo with PT/OT  POD3: vertigo improved.  Some right lower extremity pain.  POD4: doing better this morning. Reports improved pain   Physical Exam:        Vitals:    01/04/24 0259 01/04/24 0729  BP: 138/70 133/65  Pulse: 62 64  Resp: 16 13  Temp: (!) 97 F (36.1 C) (!) 97.2 F (36.2 C)  SpO2: 100% 97%      AA Ox3 CNI   Strength:5/5 throughout BLE    Data:   Other tests/results: see results review   Assessment/Plan:   Kristen Kirk is a 73 y.o presenting with symptomatic lumbar stenosis s/p L4-S1 MIS TLIF.    - mobilize - pain control - DVT prophylaxis - PTOT; dispo planning pending recommendations. Medically ready when  SNF approved   Manning Charity PA-C Neurosurgery

## 2024-01-07 NOTE — TOC Initial Note (Signed)
 Transition of Care [facility]) - Initial/Assessment Note    Patient Details  Name: EDNAMAE SCHIANO MRN: [id number] Date of Birth: 1951/03/31  Transition of Care [facility]) CM/SW Contact:    Marlowe Sax, RN Phone Number: 01/07/2024, 1:54 PM  Clinical Narrative:                  Debbora Dus completed to find a str, will review bed offers once obtained, PASSR obtained       Patient Goals and CMS Choice            Expected Discharge Plan and Services                                              Prior Living Arrangements/Services                       Activities of Daily Living   ADL Screening (condition at time of admission) Independently performs ADLs?: Yes (appropriate for developmental age) Is the patient deaf or have difficulty hearing?: No Does the patient have difficulty seeing, even when wearing glasses/contacts?: No Does the patient have difficulty concentrating, remembering, or making decisions?: No  Permission Sought/Granted                  Emotional Assessment              Admission diagnosis:  Spondylolisthesis of lumbar region [M43.16] Lumbar radiculopathy [M54.16] Spinal stenosis of lumbar region, unspecified whether neurogenic claudication present [M48.061] Patient Active Problem List   Diagnosis Date Noted   Spondylolisthesis of lumbar region 11/05/2023   Spinal stenosis of lumbar region 11/05/2023   Lumbar radiculopathy 11/05/2023   Secondary corneal edema of left eye 08/02/2023   Cystoid macular degeneration, 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   Anemia in chronic kidney disease    Essential hypertension    UTI (urinary tract infection)    Type II diabetes mellitus with renal manifestations (HCC)    Immunosuppressed status (HCC) 04/15/2022   Health maintenance examination 04/14/2022   Constipation due to outlet dysfunction    Acquired leg length discrepancy 01/28/2021   ASCUS of cervix with negative high risk HPV 10/01/2020    Thoracic spine pain 01/05/2020   Chronic lower back pain 10/08/2019   Osteoporosis 05/03/2019   Advanced care planning/counseling discussion 03/20/2019   MCI (mild cognitive impairment) with memory loss 03/20/2019   History of renal transplant 04/01/2017   Incontinence of feces 01/11/2017    Class: Acute   Insomnia 01/14/2013   Xerostomia 01/29/2012   Medicare annual wellness visit, subsequent 07/20/2011   Vaginal atrophy 04/17/2011   Type 2 diabetes mellitus with other specified complication (HCC)    Hyperlipidemia associated with type 2 diabetes mellitus (HCC)    GERD (gastroesophageal reflux disease)    Mixed incontinence urge and stress    Vitamin D deficiency 05/19/2009   MUSCLE CRAMPS 02/22/2009   Alopecia 10/05/2008   Vertigo 07/31/2008   Osteoarthritis 01/03/2007   Primary open angle glaucoma (POAG) of both eyes, severe stage 10/03/1999   PCP:  Eustaquio Boyden, MD Pharmacy:   [facility] [address], [place] - [address] [place] [address] Phone: [phone] Fax: [phone]     Social Drivers of Health (SDOH) Social History: SDOH Screenings   Food Insecurity: No Food Insecurity (01/03/2024)  Housing: Low Risk  (  01/03/2024)  Transportation Needs: No Transportation Needs (01/03/2024)  Utilities: Not At Risk (01/03/2024)  Alcohol Screen: Low Risk  (07/31/2023)  Depression (PHQ2-9): Low Risk  (07/31/2023)  Financial Resource Strain: Low Risk  (07/31/2023)  Physical Activity: Inactive (07/31/2023)  Social Connections: Socially Integrated (01/03/2024)  Stress: No Stress Concern Present (07/31/2023)  Tobacco Use: Medium Risk (01/03/2024)  Health Literacy: Adequate Health Literacy (07/31/2023)   SDOH Interventions:     Readmission Risk Interventions     No data to display

## 2024-01-07 NOTE — NC FL2 (Signed)
 [place] MEDICAID FL2 LEVEL OF CARE FORM     IDENTIFICATION  Patient Name: Kristen Kirk Birthdate: Jul 14, 1951 Sex: female Admission Date (Current Location): 01/03/2024  [facility] and [place] Number:  Chiropodist and Address:  [facility], [address], [place], [place] [address]      Provider Number: [id number]  Attending Physician Name and Address:  Lovenia Kim, MD  Relative Name and Phone Number:  Jannelly Bergren  [facility]  Emergency Contact  [phone]  [address]  [place] [address]    Current Level of Care:   Recommended Level of Care:   Prior Approval Number:    Date Approved/Denied:   PASRR Number: [id number]  Discharge Plan: SNF    Current Diagnoses: Patient Active Problem List   Diagnosis Date Noted   Spondylolisthesis of lumbar region 11/05/2023   Spinal stenosis of lumbar region 11/05/2023   Lumbar radiculopathy 11/05/2023   Secondary corneal edema of left eye 08/02/2023   Cystoid macular degeneration, 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   Anemia in chronic kidney disease    Essential hypertension    UTI (urinary tract infection)    Type II diabetes mellitus with renal manifestations (HCC)    Immunosuppressed status (HCC) 04/15/2022   Health maintenance examination 04/14/2022   Constipation due to outlet dysfunction    Acquired leg length discrepancy 01/28/2021   ASCUS of cervix with negative high risk HPV 10/01/2020   Thoracic spine pain 01/05/2020   Chronic lower back pain 10/08/2019   Osteoporosis 05/03/2019   Advanced care planning/counseling discussion 03/20/2019   MCI (mild cognitive impairment) with memory loss 03/20/2019   History of renal transplant 04/01/2017   Incontinence of feces 01/11/2017   Insomnia 01/14/2013   Xerostomia 01/29/2012   Medicare annual wellness visit, subsequent 07/20/2011   Vaginal atrophy 04/17/2011   Type 2 diabetes mellitus with other specified complication (HCC)     Hyperlipidemia associated with type 2 diabetes mellitus (HCC)    GERD (gastroesophageal reflux disease)    Mixed incontinence urge and stress    Vitamin D deficiency 05/19/2009   MUSCLE CRAMPS 02/22/2009   Alopecia 10/05/2008   Vertigo 07/31/2008   Osteoarthritis 01/03/2007   Primary open angle glaucoma (POAG) of both eyes, severe stage 10/03/1999    Orientation RESPIRATION BLADDER Height & Weight     Self, Time, Situation, Place  Normal Incontinent Weight: 59.9 kg Height:  5\' 2"  (157.5 cm)  BEHAVIORAL SYMPTOMS/MOOD NEUROLOGICAL BOWEL NUTRITION STATUS      Continent Diet (carb modified)  AMBULATORY STATUS COMMUNICATION OF NEEDS Skin   Extensive Assist Verbally Normal, Surgical wounds                       Personal Care Assistance Level of Assistance  Bathing, Feeding, Dressing Bathing Assistance: Limited assistance Feeding assistance: Independent Dressing Assistance: Limited assistance     Functional Limitations Info  Sight, Hearing, Speech Sight Info: Adequate Hearing Info: Adequate Speech Info: Adequate    SPECIAL CARE FACTORS FREQUENCY  PT (By licensed PT), OT (By licensed OT)     PT Frequency: 5 times per week OT Frequency: 5 times per week            Contractures Contractures Info: Not present    Additional Factors Info  Code Status, Allergies Code Status Info: Full Code Allergies Info: Shellfish-derived Products, Brimonidine, Tramadol           Current Medications (01/07/2024):  This is the current  hospital active medication list Current Facility-Administered Medications  Medication Dose Route Frequency Provider Last Rate Last Admin   acetaminophen (TYLENOL) tablet 650 mg  650 mg Oral Q4H PRN Jeanmarie Flores, PA-C   650 mg at 01/06/24 2226   Or   acetaminophen (TYLENOL) suppository 650 mg  650 mg Rectal Q4H PRN Blanchie Flores, PA-C       alum & mag hydroxide-simeth (MAALOX/MYLANTA) 200-200-20 MG/5ML suspension 30 mL  30 mL Oral Q6H PRN Frisbie,  Brooke, PA-C       amLODipine (NORVASC) tablet 2.5 mg  2.5 mg Oral Daily Frisbie, Brooke, PA-C   2.5 mg at 01/07/24 0848   apraclonidine (IOPIDINE) 0.5 % ophthalmic solution 1 drop  1 drop Right Eye BID Anyla Flores, PA-C   1 drop at 01/07/24 1610   bisacodyl (DULCOLAX) EC tablet 5 mg  5 mg Oral Daily PRN Frisbie, Brooke, PA-C       carvedilol (COREG) tablet 12.5 mg  12.5 mg Oral BID WC Valencia Flores, PA-C   12.5 mg at 01/07/24 0848   cholecalciferol (VITAMIN D3) 25 MCG (1000 UNIT) tablet 2,000 Units  2,000 Units Oral Daily Makaiya Flores, PA-C   2,000 Units at 01/07/24 0847   docusate sodium (COLACE) capsule 100 mg  100 mg Oral BID Wilmetta Flores, PA-C   100 mg at 01/07/24 0848   dorzolamide-timolol (COSOPT) 2-0.5 % ophthalmic solution 1 drop  1 drop Both Eyes BID Gem Flores, PA-C   1 drop at 01/07/24 0852   enoxaparin (LOVENOX) injection 40 mg  40 mg Subcutaneous Q24H Frisbie, Brooke, PA-C   40 mg at 01/07/24 0847   feeding supplement (ENSURE ENLIVE / ENSURE PLUS) liquid 237 mL  237 mL Oral BID BM Lovenia Kim, MD   237 mL at 01/07/24 9604   gabapentin (NEURONTIN) capsule 300 mg  300 mg Oral TID Roxane Flores, PA-C   300 mg at 01/07/24 0848   hydrochlorothiazide (HYDRODIURIL) tablet 12.5 mg  12.5 mg Oral Daily Gavrielle Flores, PA-C   12.5 mg at 01/07/24 0848   HYDROmorphone (DILAUDID) injection 0.5 mg  0.5 mg Intravenous Q4H PRN Hallelujah Flores, PA-C   0.5 mg at 01/03/24 1828   insulin aspart (novoLOG) injection 0-15 Units  0-15 Units Subcutaneous TID WC Zaniya Flores, PA-C   2 Units at 01/07/24 1247   latanoprost (XALATAN) 0.005 % ophthalmic solution 1 drop  1 drop Right Eye QHS Berenize Flores, PA-C   1 drop at 01/06/24 2226   meclizine (ANTIVERT) tablet 25 mg  25 mg Oral BID PRN Lovenia Kim, MD   25 mg at 01/06/24 0920   melatonin tablet 2.5-5 mg  2.5-5 mg Oral QHS PRN Mikahla Flores, PA-C       menthol-cetylpyridinium (CEPACOL) lozenge 3 mg  1 lozenge Oral PRN  Julita Flores, PA-C       Or   phenol (CHLORASEPTIC) mouth spray 1 spray  1 spray Mouth/Throat PRN Ralonda Flores, PA-C       methocarbamol (ROBAXIN) tablet 500 mg  500 mg Oral Q6H PRN Sheril Flores, PA-C   500 mg at 01/05/24 5409   Or   methocarbamol (ROBAXIN) injection 500 mg  500 mg Intravenous Q6H PRN Yeraldine Flores, PA-C   500 mg at 01/03/24 1745   mycophenolate (CELLCEPT) capsule 250 mg  250 mg Oral BID Lovenia Kim, MD   250 mg at 01/07/24 0850   ondansetron (ZOFRAN) tablet 4 mg  4 mg Oral Q6H PRN Kenzli Flores, PA-C  Or   ondansetron (ZOFRAN) injection 4 mg  4 mg Intravenous Q6H PRN Savayah Flores, PA-C   4 mg at 01/04/24 1339   oxyCODONE (Oxy IR/ROXICODONE) immediate release tablet 10 mg  10 mg Oral Q3H PRN Stefany Flores, PA-C   10 mg at 01/04/24 1050   oxyCODONE (Oxy IR/ROXICODONE) immediate release tablet 5 mg  5 mg Oral Q3H PRN Sandia Flores, PA-C   5 mg at 01/07/24 0717   pantoprazole (PROTONIX) EC tablet 20 mg  20 mg Oral Daily Delissa Flores, PA-C   20 mg at 01/07/24 1610   polyvinyl alcohol (LIQUIFILM TEARS) 1.4 % ophthalmic solution 1 drop  1 drop Both Eyes PRN Vernon Flores, PA-C       pravastatin (PRAVACHOL) tablet 80 mg  80 mg Oral QPM Wileen Flores, PA-C   80 mg at 01/06/24 1824   prednisoLONE acetate (PRED FORTE) 1 % ophthalmic suspension 1 drop  1 drop Left Eye QID Nealie Flores, PA-C   1 drop at 01/07/24 9604   senna (SENOKOT) tablet 8.6 mg  1 tablet Oral BID Evelia Flores, PA-C   8.6 mg at 01/07/24 0848   senna-docusate (Senokot-S) tablet 1 tablet  1 tablet Oral QHS PRN Naome Flores, PA-C       sodium chloride flush (NS) 0.9 % injection 3 mL  3 mL Intravenous Q12H Tabita Flores, PA-C   3 mL at 01/07/24 5409   sodium chloride flush (NS) 0.9 % injection 3 mL  3 mL Intravenous PRN Frisbie, Brooke, PA-C       tacrolimus (PROGRAF) capsule 1 mg  1 mg Oral Daily Otelia Sergeant, RPH   1 mg at 01/07/24 8119   And   tacrolimus (PROGRAF)  capsule 0.5 mg  0.5 mg Oral QHS Otelia Sergeant, RPH   0.5 mg at 01/06/24 2227   zolpidem (AMBIEN) tablet 5 mg  5 mg Oral QHS PRN Beyza Flores, PA-C         Discharge Medications: Please see discharge summary for a list of discharge medications.  Relevant Imaging Results:  Relevant Lab Results:   Additional Information SS# [id number]  Marlowe Sax, RN

## 2024-01-07 NOTE — Care Management Important Message (Signed)
 Important Message  Patient Details  Name: Kristen Kirk MRN: [id number] Date of Birth: [birth year]   Important Message Given:  Yes - Medicare IM     Cristela Blue, CMA 01/07/2024, 10:34 AM

## 2024-01-08 LAB — GLUCOSE, CAPILLARY
Glucose-Capillary: 157 mg/dL — ABNORMAL HIGH (ref 70–99)
Glucose-Capillary: 221 mg/dL — ABNORMAL HIGH (ref 70–99)
Glucose-Capillary: 191 mg/dL — ABNORMAL HIGH (ref 70–99)
Glucose-Capillary: 156 mg/dL — ABNORMAL HIGH (ref 70–99)

## 2024-01-08 NOTE — Progress Notes (Signed)
 Occupational Therapy Treatment Patient Details Name: Kristen Kirk MRN: [id number] DOB: [birth year] Today's Date: 01/08/2024   History of present illness Pt is a 73 y.o. female s/p transforaminal lumbar interbody fusion on 01/03/24. PMH significant of kidney transplantation on immunosuppressants, HTN, HLD, diet-controlled diabetes, GERD, chronic fecal incontinence (s/p of sacral neurostimulator), anemia, CKD-3A, memory impairment   OT comments  Pt seen for OT tx. PT in recliner, family present and supportive throughout. Pt required MIN A to stand from recliner with VC for hand placement and moderate posterior lean requiring assist and VC to correct. Pt unsteady with transfers but balance seems to improve slightly once up and moving. CGA with RW for in-room mobility and to bathroom. CGA for toilet transfer with BSC frame over the toilet with VC for positioning and hand placement to safely descend. As pt descended, pt noted incontinence of urine. Additional time spent to clean the floor while pt voided further to minimize risk of falling. MAX A to doff/don socks and MIN A to doff/don gown. Pt required VC for pericare thoroughness. Pt endorsed mild to moderate pain with mobility. Continues to require significant cues for sequencing to support back precautions and safety. Continues to benefit from skilled OT services.       If plan is discharge home, recommend the following:  Direct supervision/assist for financial management;Direct supervision/assist for medications management;Assist for transportation;Help with stairs or ramp for entrance;Supervision due to cognitive status;A lot of help with bathing/dressing/bathroom;A little help with walking and/or transfers;Assistance with cooking/housework   Equipment Recommendations  BSC/3in1       Precautions / Restrictions Precautions Precautions: Fall;Back Recall of Precautions/Restrictions: Impaired Precaution/Restrictions Comments: no brace  needed Restrictions Weight Bearing Restrictions Per Provider Order: No       Mobility Bed Mobility Overal bed mobility: Needs Assistance Bed Mobility: Sit to Sidelying, Rolling Rolling: Contact guard assist       Sit to sidelying: Mod assist General bed mobility comments: VC for sequencing of log roll    Transfers Overall transfer level: Needs assistance Equipment used: Rolling walker (2 wheels) Transfers: Sit to/from Stand Sit to Stand: Min assist, Mod assist    General transfer comment: MOD A from recliner, MIN A from slightly taller BSC frame over toilet; VC for hand placement both times     Balance Overall balance assessment: Needs assistance Sitting-balance support: Feet supported Sitting balance-Leahy Scale: Fair   Postural control: Posterior lean Standing balance support: Bilateral upper extremity supported, During functional activity, Reliant on assistive device for balance Standing balance-Leahy Scale: Poor Standing balance comment: required MIN A to prevent posterior LOB upon initial stand from recliner, assist to reposition RW for improved balance, unsteady        ADL either performed or assessed with clinical judgement   ADL Overall ADL's : Needs assistance/impaired      Upper Body Dressing : Sitting;Minimal assistance   Lower Body Dressing: Sit to/from stand;Minimal assistance Lower Body Dressing Details (indicate cue type and reason): MAX A for socks, MIN A for threading brief over feet, pt able to complete donning over hips in standing with CGA-MIN A for standing balance, alternating UE support on RW. Toilet Transfer: Contact guard assist;BSC/3in1;Regular Toilet;Rolling walker (2 wheels) Toilet Transfer Details (indicate cue type and reason): BSC over toilet Toileting- Clothing Manipulation and Hygiene: Sitting/lateral lean;Supervision/safety       Functional mobility during ADLs: Rolling walker (2 wheels);Cueing for sequencing;Contact guard  assist;Minimal assistance;Cueing for safety       Communication Communication Communication:  No apparent difficulties   Cognition Arousal: Alert Behavior During Therapy: WFL for tasks assessed/performed Cognition: Cognition impaired     Awareness: Online awareness impaired Memory impairment (select all impairments): Short-term memory   Executive functioning impairment (select all impairments): Problem solving      Following commands: Impaired Following commands impaired: Follows one step commands with increased time      Cueing   Cueing Techniques: Verbal cues  Exercises Other Exercises Other Exercises: Pt/family educated in benefits of frequent opportunities for mobility and encouraged limited use of purewick to promote additional opportunities for mobility when going to the bathroom.            Pertinent Vitals/ Pain       Pain Assessment Pain Assessment: 0-10 Faces Pain Scale: Hurts little more Pain Location: back Pain Descriptors / Indicators: Discomfort, Sore Pain Intervention(s): Limited activity within patient's tolerance, Monitored during session, Repositioned, Premedicated before session   Frequency  Min 3X/week      Progress Toward Goals  OT Goals(current goals can now be found in the care plan section)  Progress towards OT goals: Progressing toward goals  Acute Rehab OT Goals OT Goal Formulation: With patient Time For Goal Achievement: 01/18/24 Potential to Achieve Goals: Good  Plan         AM-PAC OT "6 Clicks" Daily Activity     Outcome Measure   Help from another person eating meals?: None Help from another person taking care of personal grooming?: A Little Help from another person toileting, which includes using toliet, bedpan, or urinal?: A Lot Help from another person bathing (including washing, rinsing, drying)?: A Lot Help from another person to put on and taking off regular upper body clothing?: A Little Help from another person to  put on and taking off regular lower body clothing?: A Lot 6 Click Score: 16    End of Session Equipment Utilized During Treatment: Gait belt;Rolling walker (2 wheels)  OT Visit Diagnosis: Unsteadiness on feet (R26.81);Muscle weakness (generalized) (M62.81);Other abnormalities of gait and mobility (R26.89)   Activity Tolerance Patient tolerated treatment well   Patient Left in bed;with call bell/phone within reach;with bed alarm set;with family/visitor present           Time: 1914-7829 OT Time Calculation (min): 25 min  Charges: OT General Charges $OT Visit: 1 Visit OT Treatments $Self Care/Home Management : 23-37 mins  Arman Filter., MPH, MS, OTR/L ascom [phone] 01/08/24, 1:47 PM

## 2024-01-08 NOTE — TOC Progression Note (Addendum)
 Transition of Care [facility]) - Progression Note    Patient Details  Name: Kristen Kirk MRN: [id number] Date of Birth: [birth year]  [facility] CM/SW Contact  Marlowe Sax, RN Phone Number: 01/08/2024, 1:13 PM  Clinical Narrative:     Met with the patient and her Family to review bed offers, they chose [facility], I notified Rickey at [facility], Ins Approved PlanAuthID:[id number] dates:4/8-4/10/25 next review date: 01/10/2024  [facility] can take tomorrow   Expected Discharge Plan: Skilled Nursing Facility Barriers to Discharge: Insurance Authorization  Expected Discharge Plan and Services   Discharge Planning Services: CM Consult   Living arrangements for the past 2 months: Single Family Home                   DME Agency: NA       HH Arranged: NA           Social Determinants of Health (SDOH) Interventions SDOH Screenings   Food Insecurity: No Food Insecurity (01/03/2024)  Housing: Low Risk  (01/03/2024)  Transportation Needs: No Transportation Needs (01/03/2024)  Utilities: Not At Risk (01/03/2024)  Alcohol Screen: Low Risk  (07/31/2023)  Depression (PHQ2-9): Low Risk  (07/31/2023)  Financial Resource Strain: Low Risk  (07/31/2023)  Physical Activity: Inactive (07/31/2023)  Social Connections: Socially Integrated (01/03/2024)  Stress: No Stress Concern Present (07/31/2023)  Tobacco Use: Medium Risk (01/03/2024)  Health Literacy: Adequate Health Literacy (07/31/2023)    Readmission Risk Interventions     No data to display

## 2024-01-08 NOTE — Plan of Care (Signed)
  Problem: Education: Goal: Ability to describe self-care measures that may prevent or decrease complications (Diabetes Survival Skills Education) will improve Outcome: Progressing Goal: Individualized Educational Video(s) Outcome: Progressing   Problem: Coping: Goal: Ability to adjust to condition or change in health will improve Outcome: Progressing   Problem: Fluid Volume: Goal: Ability to maintain a balanced intake and output will improve Outcome: Progressing   Problem: Health Behavior/Discharge Planning: Goal: Ability to identify and utilize available resources and services will improve Outcome: Progressing Goal: Ability to manage health-related needs will improve Outcome: Progressing   Problem: Metabolic: Goal: Ability to maintain appropriate glucose levels will improve Outcome: Progressing   Problem: Nutritional: Goal: Maintenance of adequate nutrition will improve Outcome: Progressing Goal: Progress toward achieving an optimal weight will improve Outcome: Progressing   Problem: Skin Integrity: Goal: Risk for impaired skin integrity will decrease Outcome: Progressing   Problem: Tissue Perfusion: Goal: Adequacy of tissue perfusion will improve Outcome: Progressing   Problem: Education: Goal: Ability to verbalize activity precautions or restrictions will improve Outcome: Progressing Goal: Knowledge of the prescribed therapeutic regimen will improve Outcome: Progressing Goal: Understanding of discharge needs will improve Outcome: Progressing   Problem: Activity: Goal: Ability to avoid complications of mobility impairment will improve Outcome: Progressing Goal: Ability to tolerate increased activity will improve Outcome: Progressing Goal: Will remain free from falls Outcome: Progressing   Problem: Bowel/Gastric: Goal: Gastrointestinal status for postoperative course will improve Outcome: Progressing   Problem: Clinical Measurements: Goal: Ability to  maintain clinical measurements within normal limits will improve Outcome: Progressing Goal: Postoperative complications will be avoided or minimized Outcome: Progressing Goal: Diagnostic test results will improve Outcome: Progressing   Problem: Pain Management: Goal: Pain level will decrease Outcome: Progressing   Problem: Skin Integrity: Goal: Will show signs of wound healing Outcome: Progressing   Problem: Health Behavior/Discharge Planning: Goal: Identification of resources available to assist in meeting health care needs will improve Outcome: Progressing   Problem: Bladder/Genitourinary: Goal: Urinary functional status for postoperative course will improve Outcome: Progressing   Problem: Education: Goal: Knowledge of General Education information will improve Description: Including pain rating scale, medication(s)/side effects and non-pharmacologic comfort measures Outcome: Progressing   Problem: Health Behavior/Discharge Planning: Goal: Ability to manage health-related needs will improve Outcome: Progressing   Problem: Clinical Measurements: Goal: Ability to maintain clinical measurements within normal limits will improve Outcome: Progressing Goal: Will remain free from infection Outcome: Progressing Goal: Diagnostic test results will improve Outcome: Progressing Goal: Respiratory complications will improve Outcome: Progressing Goal: Cardiovascular complication will be avoided Outcome: Progressing   Problem: Activity: Goal: Risk for activity intolerance will decrease Outcome: Progressing   Problem: Nutrition: Goal: Adequate nutrition will be maintained Outcome: Progressing   Problem: Coping: Goal: Level of anxiety will decrease Outcome: Progressing   Problem: Elimination: Goal: Will not experience complications related to bowel motility Outcome: Progressing Goal: Will not experience complications related to urinary retention Outcome: Progressing    Problem: Pain Managment: Goal: General experience of comfort will improve and/or be controlled Outcome: Progressing   Problem: Safety: Goal: Ability to remain free from injury will improve Outcome: Progressing   Problem: Skin Integrity: Goal: Risk for impaired skin integrity will decrease Outcome: Progressing

## 2024-01-08 NOTE — Progress Notes (Signed)
 History: RAYLEIGH GILLYARD is s/p L4-S1 MIS TLIF.    POD1: Pt doing well this morning with minimal pain   POD2: doing well, but had some vertigo with PT/OT  POD3: vertigo improved.  Some right lower extremity pain.  POD4: doing better this morning. Reports improved pain  POD5: NAEO   Physical Exam:        Vitals:    01/04/24 0259 01/04/24 0729  BP: 138/70 133/65  Pulse: 62 64  Resp: 16 13  Temp: (!) 97 F (36.1 C) (!) 97.2 F (36.2 C)  SpO2: 100% 97%      AA Ox3 CNI   Strength:5/5 throughout BLE    Data:   Other tests/results: see results review   Assessment/Plan:   SCOTTLYN MCHANEY is a 73 y.o presenting with symptomatic lumbar stenosis s/p L4-S1 MIS TLIF.    - mobilize - pain control - DVT prophylaxis - PTOT; dispo planning pending recommendations. Medically ready when  SNF approved   Manning Charity PA-C Neurosurgery

## 2024-01-08 NOTE — Discharge Summary (Signed)
 Discharge Summary  Patient ID: Kristen Kirk MRN: [id number] DOB/AGE: 72-May-1952 72 y.o.  Admit date: 01/03/2024 Discharge date: 01/09/2024  Admission Diagnoses: M43.16 Spondylolisthesis of lumbar region M54.16 Lumbar radiculopathy M48.061 Spinal stenosis of lumbar region, unspecified whether neurogenic claudication  Discharge Diagnoses:  Principal Problem:   Spondylolisthesis of lumbar region Active Problems:   Spinal stenosis of lumbar region   Lumbar radiculopathy   Discharged Condition: good  Hospital Course:  Kristen Kirk is a pleasant 73 y.o presenting with lumbar spondylolisthesis and radiculopathy status post L4-S1 MIS TLIF.  Her intraoperative course was uncomplicated.  She was admitted for pain control and therapy evaluation.  She experienced some vertigo and pain in the first couple of days postoperatively however this improved.  She worked well with therapy and was deemed appropriate for discharge skilled nursing facility.  She was discharged on postop day 5.  Consults: None  Significant Diagnostic Studies: none   Treatments: surgery: as above. Please see separately dictated operative report for further details.   Discharge Exam: Blood pressure 123/81, pulse 74, temperature 98.3 F (36.8 C), resp. rate 18, height 5\' 2"  (1.575 m), weight 59.9 kg, SpO2 96%. A&O Ambulating the halls with PT during evaluation  Disposition: Discharge disposition: 03-Skilled Nursing Facility       Discharge Instructions     Diet - low sodium heart healthy   Complete by: As directed    Incentive spirometry RT   Complete by: As directed    Increase activity slowly   Complete by: As directed       Allergies as of 01/09/2024       Reactions   Shellfish-derived Products Swelling   Brimonidine    Eye redness   Tramadol    Dizziness even at 1/2 tab        Medication List     PAUSE taking these medications    mycophenolate 500 MG tablet Wait to take this until  your doctor or other care provider tells you to start again. Commonly known as: CELLCEPT Take 500 mg by mouth 2 (two) times daily.       STOP taking these medications    sulfamethoxazole-trimethoprim 800-160 MG tablet Commonly known as: BACTRIM DS   tiZANidine 2 MG tablet Commonly known as: ZANAFLEX       TAKE these medications    alendronate 70 MG tablet Commonly known as: FOSAMAX Take 1 tablet (70 mg total) by mouth every 7 (seven) days. Take with a full glass of water on an empty stomach. What changed: additional instructions   amLODipine 2.5 MG tablet Commonly known as: NORVASC Take 2.5 mg by mouth daily.   apraclonidine 0.5 % ophthalmic solution Commonly known as: IOPIDINE Place 1 drop into the right eye in the morning and at bedtime.   aspirin 81 MG tablet Take 81 mg by mouth daily.   Carboxymethylcellulose Sodium 0.25 % Soln Place 1 drop into both eyes as needed.   carvedilol 25 MG tablet Commonly known as: COREG Take 12.5 mg by mouth 2 (two) times daily with a meal.   docusate sodium 100 MG capsule Commonly known as: COLACE Take 100 mg by mouth 2 (two) times daily as needed for mild constipation or moderate constipation.   dorzolamide-timolol 2-0.5 % ophthalmic solution Commonly known as: COSOPT Place 1 drop into both eyes 2 (two) times daily.   gabapentin 300 MG capsule Commonly known as: NEURONTIN Take 1 capsule (300 mg total) by mouth 3 (three) times daily.   glucose  blood test strip Check sugars once daily   hydrochlorothiazide 12.5 MG tablet Commonly known as: HYDRODIURIL Take 1 tablet (12.5 mg total) by mouth daily.   latanoprost 0.005 % ophthalmic solution Commonly known as: XALATAN Place 1 drop into the right eye at bedtime.   Melatonin 3 MG Caps Take 1-2 capsules (3-6 mg total) by mouth at bedtime as needed (sleep).   methocarbamol 500 MG tablet Commonly known as: ROBAXIN Take 1 tablet (500 mg total) by mouth every 6 (six) hours  as needed for muscle spasms.   multivitamin tablet Take 1 tablet by mouth daily.   ONE TOUCH ULTRA SYSTEM KIT w/Device Kit 1 kit by Does not apply route once.   oxyCODONE 5 MG immediate release tablet Commonly known as: Oxy IR/ROXICODONE Take 1 tablet (5 mg total) by mouth every 3 (three) hours as needed for moderate pain (pain score 4-6).   pantoprazole 20 MG tablet Commonly known as: PROTONIX Take 1 tablet (20 mg total) by mouth daily.   pravastatin 80 MG tablet Commonly known as: PRAVACHOL Take 1 tablet (80 mg total) by mouth every evening.   prednisoLONE acetate 1 % ophthalmic suspension Commonly known as: PRED FORTE Place 1 drop into the left eye in the morning, at noon, and at bedtime.   senna 8.6 MG Tabs tablet Commonly known as: SENOKOT Take 1 tablet (8.6 mg total) by mouth 2 (two) times daily.   tacrolimus 0.5 MG capsule Commonly known as: PROGRAF Take 0.5-1 mg by mouth See admin instructions. Take 2 capsules (1mg ) by mouth every morning and take 1 capsule (0.5mg ) by mouth every night   TYLENOL 500 MG tablet Generic drug: acetaminophen Take 500 mg by mouth 3 (three) times daily as needed for moderate pain (pain score 4-6) or mild pain (pain score 1-3).   Vitamin D 50 MCG (2000 UT) Caps Take 2,000 Units by mouth daily.        Follow-up Information     Delara Flores, PA-C Follow up on 01/16/2024.   Specialty: Physician Assistant Contact information: [address] [place], [place] [address] [place] [address] [phone]                 Signed: Susanne Borders 01/09/2024, 7:55 AM

## 2024-01-08 NOTE — Progress Notes (Signed)
 Physical Therapy Treatment Patient Details Name: Kristen Kirk MRN: [id number] DOB: 1950-10-11 Today's Date: 01/08/2024   History of Present Illness Pt is a 73 y.o. female s/p transforaminal lumbar interbody fusion on 01/03/24. PMH significant of kidney transplantation on immunosuppressants, HTN, HLD, diet-controlled diabetes, GERD, chronic fecal incontinence (s/p of sacral neurostimulator), anemia, CKD-3A, memory impairment    PT Comments  Pt was asleep upon arrival. She easily awakes and is oriented x 3. Agreeable to session and motivated throughout. Session greatly limited by breakfast tray arriving. Author will return later to advance gait distances. Overall continues to progress. She was able to roll L to short sit with vcs for sequencing and technique improvements. Sat EOB with only minimal c/o dizziness. "Its better than it has been." Pt was able to stand to RW and ambulated ~ 15 ft around room to recliner. No knee buckling however pt's knees are slightly flexed during standing activity. Pt was repositioning in recliner, post session, with call bell in reach and chair alarm in place. DC recs remain appropriate to maximize independence and safety with all ADLs.    If plan is discharge home, recommend the following: A little help with walking and/or transfers;A lot of help with bathing/dressing/bathroom;Assistance with cooking/housework;Direct supervision/assist for medications management;Direct supervision/assist for financial management;Assist for transportation;Help with stairs or ramp for entrance;Supervision due to cognitive status     Equipment Recommendations  Other (comment) (Defer to next level of care.)       Precautions / Restrictions Precautions Precautions: Fall;Back Recall of Precautions/Restrictions: Impaired Precaution/Restrictions Comments: no brace needed Restrictions Weight Bearing Restrictions Per Provider Order: No     Mobility  Bed Mobility Overal bed mobility:  Needs Assistance Bed Mobility: Rolling, Sidelying to Sit, Supine to Sit Rolling: Min assist, Used rails Sidelying to sit: Mod assist Supine to sit: Mod assist  General bed mobility comments: Pt continues to require mod assist to roll L to short sit. Increased time with vcs for log roll technique to maintain spinal precautions.    Transfers Overall transfer level: Needs assistance Equipment used: Rolling walker (2 wheels) Transfers: Sit to/from Stand Sit to Stand: Min assist  General transfer comment: min assist to stand from EOB and recliner 1 x    Ambulation/Gait Ambulation/Gait assistance: Contact guard assist, Min assist Gait Distance (Feet): 15 Feet Assistive device: Rolling walker (2 wheels) Gait Pattern/deviations: Step-to pattern, Decreased step length - right, Decreased step length - left, Decreased stride length Gait velocity: decr  General Gait Details: Pt ambulated ~ 15 ft with slow gait candance however tolerated well. distance limited by breakfast tray arriving.    Balance Overall balance assessment: Needs assistance Sitting-balance support: Feet supported Sitting balance-Leahy Scale: Fair     Standing balance support: Bilateral upper extremity supported, During functional activity, Reliant on assistive device for balance Standing balance-Leahy Scale: Fair Standing balance comment: BLE feet support and UE support. Close supervision while seated EOB.       Communication Communication Communication: No apparent difficulties  Cognition Arousal: Alert Behavior During Therapy: WFL for tasks assessed/performed   PT - Cognitive impairments: History of cognitive impairments, Memory      PT - Cognition Comments: Pt is A and O x 3. Agreeable to session. slow processing but is able to consistently follow commands. Following commands: Impaired      Cueing Cueing Techniques: Verbal cues         Pertinent Vitals/Pain Pain Assessment Pain Assessment: 0-10 Pain  Score: 2  Pain Location: back Pain  Descriptors / Indicators: Discomfort, Sore Pain Intervention(s): Limited activity within patient's tolerance, Monitored during session, Premedicated before session, Repositioned     PT Goals (current goals can now be found in the care plan section) Acute Rehab PT Goals Patient Stated Goal: rehab then home Progress towards PT goals: Progressing toward goals    Frequency    7X/week       AM-PAC PT "6 Clicks" Mobility   Outcome Measure  Help needed turning from your back to your side while in a flat bed without using bedrails?: A Little Help needed moving from lying on your back to sitting on the side of a flat bed without using bedrails?: A Lot Help needed moving to and from a bed to a chair (including a wheelchair)?: A Little Help needed standing up from a chair using your arms (e.g., wheelchair or bedside chair)?: A Lot Help needed to walk in hospital room?: A Lot Help needed climbing 3-5 steps with a railing? : A Lot 6 Click Score: 14    End of Session   Activity Tolerance: Patient tolerated treatment well Patient left: in chair;with call bell/phone within reach;with chair alarm set Nurse Communication: Mobility status PT Visit Diagnosis: Other abnormalities of gait and mobility (R26.89);Muscle weakness (generalized) (M62.81);Difficulty in walking, not elsewhere classified (R26.2);Unsteadiness on feet (R26.81);Pain Pain - Right/Left: Right Pain - part of body: Leg     Time: 0728-0749 PT Time Calculation (min) (ACUTE ONLY): 21 min  Charges:    $Gait Training: 8-22 mins PT General Charges $$ ACUTE PT VISIT: 1 Visit                    Jetta Lout PTA 01/08/24, 8:43 AM

## 2024-01-09 DIAGNOSIS — H4020X0 Unspecified primary angle-closure glaucoma, stage unspecified: Secondary | ICD-10-CM | POA: Diagnosis not present

## 2024-01-09 DIAGNOSIS — D649 Anemia, unspecified: Secondary | ICD-10-CM | POA: Diagnosis not present

## 2024-01-09 DIAGNOSIS — K5900 Constipation, unspecified: Secondary | ICD-10-CM | POA: Diagnosis not present

## 2024-01-09 DIAGNOSIS — N189 Chronic kidney disease, unspecified: Secondary | ICD-10-CM | POA: Diagnosis not present

## 2024-01-09 DIAGNOSIS — R269 Unspecified abnormalities of gait and mobility: Secondary | ICD-10-CM | POA: Diagnosis not present

## 2024-01-09 DIAGNOSIS — M4316 Spondylolisthesis, lumbar region: Secondary | ICD-10-CM | POA: Diagnosis not present

## 2024-01-09 DIAGNOSIS — E1122 Type 2 diabetes mellitus with diabetic chronic kidney disease: Secondary | ICD-10-CM | POA: Diagnosis not present

## 2024-01-09 DIAGNOSIS — E119 Type 2 diabetes mellitus without complications: Secondary | ICD-10-CM | POA: Diagnosis not present

## 2024-01-09 DIAGNOSIS — Z981 Arthrodesis status: Secondary | ICD-10-CM | POA: Diagnosis not present

## 2024-01-09 DIAGNOSIS — Z4789 Encounter for other orthopedic aftercare: Secondary | ICD-10-CM | POA: Diagnosis not present

## 2024-01-09 DIAGNOSIS — I129 Hypertensive chronic kidney disease with stage 1 through stage 4 chronic kidney disease, or unspecified chronic kidney disease: Secondary | ICD-10-CM | POA: Diagnosis not present

## 2024-01-09 DIAGNOSIS — R41841 Cognitive communication deficit: Secondary | ICD-10-CM | POA: Diagnosis not present

## 2024-01-09 DIAGNOSIS — M62838 Other muscle spasm: Secondary | ICD-10-CM | POA: Diagnosis not present

## 2024-01-09 DIAGNOSIS — M5126 Other intervertebral disc displacement, lumbar region: Secondary | ICD-10-CM | POA: Diagnosis not present

## 2024-01-09 DIAGNOSIS — E1169 Type 2 diabetes mellitus with other specified complication: Secondary | ICD-10-CM | POA: Diagnosis not present

## 2024-01-09 DIAGNOSIS — D72829 Elevated white blood cell count, unspecified: Secondary | ICD-10-CM | POA: Diagnosis not present

## 2024-01-09 DIAGNOSIS — Z9889 Other specified postprocedural states: Secondary | ICD-10-CM | POA: Diagnosis not present

## 2024-01-09 DIAGNOSIS — M810 Age-related osteoporosis without current pathological fracture: Secondary | ICD-10-CM | POA: Diagnosis not present

## 2024-01-09 DIAGNOSIS — Z79620 Long term (current) use of immunosuppressive biologic: Secondary | ICD-10-CM | POA: Diagnosis not present

## 2024-01-09 DIAGNOSIS — M5442 Lumbago with sciatica, left side: Secondary | ICD-10-CM | POA: Diagnosis not present

## 2024-01-09 DIAGNOSIS — E782 Mixed hyperlipidemia: Secondary | ICD-10-CM | POA: Diagnosis not present

## 2024-01-09 DIAGNOSIS — M5432 Sciatica, left side: Secondary | ICD-10-CM | POA: Diagnosis not present

## 2024-01-09 DIAGNOSIS — M79605 Pain in left leg: Secondary | ICD-10-CM | POA: Diagnosis not present

## 2024-01-09 DIAGNOSIS — G3184 Mild cognitive impairment, so stated: Secondary | ICD-10-CM | POA: Diagnosis not present

## 2024-01-09 DIAGNOSIS — M47816 Spondylosis without myelopathy or radiculopathy, lumbar region: Secondary | ICD-10-CM | POA: Diagnosis not present

## 2024-01-09 DIAGNOSIS — Z741 Need for assistance with personal care: Secondary | ICD-10-CM | POA: Diagnosis not present

## 2024-01-09 DIAGNOSIS — R1311 Dysphagia, oral phase: Secondary | ICD-10-CM | POA: Diagnosis not present

## 2024-01-09 DIAGNOSIS — R262 Difficulty in walking, not elsewhere classified: Secondary | ICD-10-CM | POA: Diagnosis not present

## 2024-01-09 DIAGNOSIS — K2100 Gastro-esophageal reflux disease with esophagitis, without bleeding: Secondary | ICD-10-CM | POA: Diagnosis not present

## 2024-01-09 DIAGNOSIS — M6281 Muscle weakness (generalized): Secondary | ICD-10-CM | POA: Diagnosis not present

## 2024-01-09 DIAGNOSIS — I10 Essential (primary) hypertension: Secondary | ICD-10-CM | POA: Diagnosis not present

## 2024-01-09 DIAGNOSIS — M48061 Spinal stenosis, lumbar region without neurogenic claudication: Secondary | ICD-10-CM | POA: Diagnosis not present

## 2024-01-09 DIAGNOSIS — N390 Urinary tract infection, site not specified: Secondary | ICD-10-CM | POA: Diagnosis not present

## 2024-01-09 LAB — GLUCOSE, CAPILLARY: Glucose-Capillary: 158 mg/dL — ABNORMAL HIGH (ref 70–99)

## 2024-01-09 MED ORDER — SENNA 8.6 MG PO TABS: 1.0000 | ORAL_TABLET | Freq: Two times a day (BID) | ORAL | Status: AC

## 2024-01-09 MED ORDER — GABAPENTIN 300 MG PO CAPS: 300.0000 mg | ORAL_CAPSULE | Freq: Three times a day (TID) | ORAL | Status: DC

## 2024-01-09 MED ORDER — METHOCARBAMOL 500 MG PO TABS: 500.0000 mg | ORAL_TABLET | Freq: Four times a day (QID) | ORAL | Status: DC | PRN

## 2024-01-09 MED ORDER — OXYCODONE HCL 5 MG PO TABS: 5.0000 mg | ORAL_TABLET | ORAL | 0 refills | Status: DC | PRN

## 2024-01-09 NOTE — Plan of Care (Signed)
  Problem: Coping: Goal: Ability to adjust to condition or change in health will improve Outcome: Progressing   Problem: Nutritional: Goal: Maintenance of adequate nutrition will improve Outcome: Progressing   Problem: Activity: Goal: Ability to avoid complications of mobility impairment will improve Outcome: Progressing   Problem: Clinical Measurements: Goal: Diagnostic test results will improve Outcome: Progressing   Problem: Pain Management: Goal: Pain level will decrease Outcome: Progressing   Problem: Activity: Goal: Risk for activity intolerance will decrease Outcome: Progressing   Problem: Pain Managment: Goal: General experience of comfort will improve and/or be controlled Outcome: Progressing

## 2024-01-09 NOTE — Progress Notes (Signed)
 Report called to [facility].  Spouse to take wife via car.

## 2024-01-09 NOTE — Progress Notes (Signed)
 History: Kristen Kirk is s/p L4-S1 MIS TLIF.    POD1: Pt doing well this morning with minimal pain   POD2: doing well, but had some vertigo with PT/OT  POD3: vertigo improved.  Some right lower extremity pain.  POD4: doing better this morning. Reports improved pain  POD5: NAEO  POD6: pt doing well this morning and ambulating the halls with PT   Physical Exam:        Vitals:    01/04/24 0259 01/04/24 0729  BP: 138/70 133/65  Pulse: 62 64  Resp: 16 13  Temp: (!) 97 F (36.1 C) (!) 97.2 F (36.2 C)  SpO2: 100% 97%      AA Ox3 CNI   Ambulating in the hall with therapy   Data:   Other tests/results: see results review   Assessment/Plan:   Kristen Kirk is a 73 y.o presenting with symptomatic lumbar stenosis s/p L4-S1 MIS TLIF.    - mobilize - pain control - DVT prophylaxis - PTOT; Plan for d/c this morning  to SNF   Manning Charity PA-C Neurosurgery

## 2024-01-09 NOTE — TOC Progression Note (Signed)
 Transition of Care [facility]) - Progression Note    Patient Details  Name: Kristen Kirk MRN: [id number] Date of Birth: [birth year]  Transition of [facility]) CM/SW Contact  Marlowe Sax, RN Phone Number: 01/09/2024, 10:36 AM  Clinical Narrative:     The patient will DC to [facility] room E10 today, Called Husband He will pick her up and transport to [facility]  Expected Discharge Plan: Skilled Nursing Facility Barriers to Discharge: English as a second language teacher  Expected Discharge Plan and Services   Discharge Planning Services: CM Consult   Living arrangements for the past 2 months: Single Family Home Expected Discharge Date: 01/08/24                 DME Agency: NA       HH Arranged: NA           Social Determinants of Health (SDOH) Interventions SDOH Screenings   Food Insecurity: No Food Insecurity (01/03/2024)  Housing: Low Risk  (01/03/2024)  Transportation Needs: No Transportation Needs (01/03/2024)  Utilities: Not At Risk (01/03/2024)  Alcohol Screen: Low Risk  (07/31/2023)  Depression (PHQ2-9): Low Risk  (07/31/2023)  Financial Resource Strain: Low Risk  (07/31/2023)  Physical Activity: Inactive (07/31/2023)  Social Connections: Socially Integrated (01/03/2024)  Stress: No Stress Concern Present (07/31/2023)  Tobacco Use: Medium Risk (01/03/2024)  Health Literacy: Adequate Health Literacy (07/31/2023)    Readmission Risk Interventions     No data to display

## 2024-01-10 DIAGNOSIS — K5900 Constipation, unspecified: Secondary | ICD-10-CM | POA: Diagnosis not present

## 2024-01-10 DIAGNOSIS — R1311 Dysphagia, oral phase: Secondary | ICD-10-CM | POA: Diagnosis not present

## 2024-01-10 DIAGNOSIS — R41841 Cognitive communication deficit: Secondary | ICD-10-CM | POA: Diagnosis not present

## 2024-01-10 DIAGNOSIS — E1169 Type 2 diabetes mellitus with other specified complication: Secondary | ICD-10-CM | POA: Diagnosis not present

## 2024-01-14 DIAGNOSIS — M47816 Spondylosis without myelopathy or radiculopathy, lumbar region: Secondary | ICD-10-CM | POA: Diagnosis not present

## 2024-01-14 DIAGNOSIS — E119 Type 2 diabetes mellitus without complications: Secondary | ICD-10-CM | POA: Diagnosis not present

## 2024-01-14 DIAGNOSIS — K5900 Constipation, unspecified: Secondary | ICD-10-CM | POA: Diagnosis not present

## 2024-01-16 ENCOUNTER — Encounter: Payer: Self-pay | Admitting: Physician Assistant

## 2024-01-16 ENCOUNTER — Ambulatory Visit (INDEPENDENT_AMBULATORY_CARE_PROVIDER_SITE_OTHER): Admitting: Physician Assistant

## 2024-01-16 VITALS — BP 118/70 | Temp 98.8°F | Ht 62.0 in | Wt 132.0 lb

## 2024-01-16 DIAGNOSIS — Z9889 Other specified postprocedural states: Secondary | ICD-10-CM

## 2024-01-16 DIAGNOSIS — M48062 Spinal stenosis, lumbar region with neurogenic claudication: Secondary | ICD-10-CM

## 2024-01-16 NOTE — Progress Notes (Unsigned)
   REFERRING PHYSICIAN:  Claire Crick, Md [address] [place],  [place] [address]  DOS: MIS TLIF at L4-S1, left approach   HISTORY OF PRESENT ILLNESS: Kristen Kirk is approximately *** status post ***. she is doing well. ***  Pain in back and legs. Hard to tewll if any improvement. Lots of back and leg pain. At [facility]  Taking oxy Both legs    Therapy twice a day.  - walking better  legs and feet are stronger.   Using a walker     PHYSICAL EXAMINATION:  General: Patient is well developed, well nourished, calm, collected, and in no apparent distress.   NEUROLOGICAL:  General: In no acute distress.   Awake, alert, oriented to person, place, and time.  Pupils equal round and reactive to light.  Facial tone is symmetric.  Tongue protrusion is midline.  There is no pronator drift.   Strength:            Side Iliopsoas Quads Hamstring PF DF EHL  R 5 5 5 5 5 5   L 5 5 5 5 5 5    Incision c/d/i   ROS (Neurologic):  Negative except as noted above  IMAGING: ***  ASSESSMENT/PLAN:  Kristen Kirk is doing well approximately *** after ***. she will follow up ***  ***  I have advised the patient to lift up to 10 pounds until 6 weeks after surgery, then increase up to 25 pounds until 12 weeks after surgery.  After 12 weeks post-op, the patient advised to increase activity as tolerated.  Advised to contact the office if any questions or concerns arise.  Ludwig Safer PA-C Department of neurosurgery

## 2024-01-21 ENCOUNTER — Telehealth: Payer: Self-pay | Admitting: Neurosurgery

## 2024-01-21 DIAGNOSIS — M47816 Spondylosis without myelopathy or radiculopathy, lumbar region: Secondary | ICD-10-CM | POA: Diagnosis not present

## 2024-01-21 DIAGNOSIS — E119 Type 2 diabetes mellitus without complications: Secondary | ICD-10-CM | POA: Diagnosis not present

## 2024-01-21 DIAGNOSIS — M5432 Sciatica, left side: Secondary | ICD-10-CM | POA: Diagnosis not present

## 2024-01-21 DIAGNOSIS — I10 Essential (primary) hypertension: Secondary | ICD-10-CM | POA: Diagnosis not present

## 2024-01-21 NOTE — Telephone Encounter (Signed)
 Patient reports Left leg pain that started prior to surgery and came back intermittently since surgery.   Starts at left hip and radiates down to ankle. She states that oxycodone  helps but does come back when the medication wears off.   She denies Numbness, tingling or new weakness.   Describes pain as uncomfortable but says it is not sharp in nature.   Denies any calf pain, redness or swelling.   She states her pain at night can be up to 10/10 causing her difficulty sleeping which was the case last night.  10 at night  Rates pain at 5 when assessed during phone call.  Wants to discuss with MD to see if this pain is expected and what she can take other than tylenol  like she was given last night at the Rehab facility.

## 2024-01-21 NOTE — Telephone Encounter (Signed)
 L4-S1 maximum access transforaminal lumbar interbody fusion (MAS TLIF) on 01/03/24   Patient is calling to let our office know that she has been having left leg pain since her surgery. She states that the pain woke her up this morning about 3am it was a 10/10. She states that she was given Tylenol  by the nurse at the rehab facility but it did nothing to help her pain.

## 2024-01-22 MED ORDER — OXYCODONE HCL 5 MG PO TABS: 5.0000 mg | ORAL_TABLET | ORAL | 0 refills | Status: DC | PRN

## 2024-01-23 ENCOUNTER — Telehealth: Payer: Self-pay | Admitting: Neurosurgery

## 2024-01-23 DIAGNOSIS — M48062 Spinal stenosis, lumbar region with neurogenic claudication: Secondary | ICD-10-CM

## 2024-01-23 DIAGNOSIS — Z9889 Other specified postprocedural states: Secondary | ICD-10-CM

## 2024-01-23 MED ORDER — METHYLPREDNISOLONE 4 MG PO TBPK: ORAL_TABLET | ORAL | 0 refills | Status: DC

## 2024-01-23 NOTE — Telephone Encounter (Signed)
 I called Kristen Kirk, she states continued concerns with pain at night. She says that she is still having exacerbated left leg pain that was present before surgery.   During the day her pain is 0 but as soon as she lays down to sleep she is at 10/10 pain. She rates it the worst pain ever due to her concerns for inability to sleep. She is anxious that something is wrong and notes thinking about going to the ER for evaluation.   She has been taking 5mg  oxycodone  at night. She says this brings her pain and discomfort down to 6-7/10.  I assessed that Kristen Kirk is currently Alert and oriented x4 with clear speech. Unsure about her sister's concerns of incohercy I would like to speak with her sister to find more about these concerns.

## 2024-01-23 NOTE — Addendum Note (Signed)
 Addended by: Mikkel Charrette on: 01/23/2024 04:36 PM   Modules accepted: Orders

## 2024-01-23 NOTE — Telephone Encounter (Signed)
 I also spoke with Kristen Kirk today.  She states that her pain is mostly when she is lying flat, and that when she is seated in her wheelchair or sitting up that she is not having a significant amount of leg pain.  She does state that it is quite uncomfortable when she is laying flat in bed.  We discussed trying to sleep in a recliner tonight to see whether or not that will help with her symptoms or if she has an adjustable bed to make it such that the bed does not go flat during sleep.  I also let her know that I would write her for a steroid taper to see whether or not this could calm down some of her nerve irritation.  Will plan for ordering x-rays as well and will review those to make sure there is no hardware malfunction.

## 2024-01-23 NOTE — Telephone Encounter (Signed)
 Message left during lunch at 12:48pm: L4-S1 maximum access transforaminal lumbar interbody fusion (MAS TLIF) on 01/03/24  [name] calling for her sister Kristen Kirk, calling because pt is having pain down her left leg, she would like to talk to the doctor about it. She is taking oxycodone  but she thinks they might be giving her to much she is incoherent. Call the patient's cell phone please.

## 2024-01-24 ENCOUNTER — Other Ambulatory Visit: Payer: Self-pay

## 2024-01-24 ENCOUNTER — Emergency Department

## 2024-01-24 ENCOUNTER — Emergency Department
Admission: EM | Admit: 2024-01-24 | Discharge: 2024-01-24 | Disposition: A | Discharge status: Home / Self Care | Attending: Emergency Medicine | Admitting: Emergency Medicine

## 2024-01-24 ENCOUNTER — Encounter: Payer: Self-pay | Admitting: *Deleted

## 2024-01-24 DIAGNOSIS — M5126 Other intervertebral disc displacement, lumbar region: Secondary | ICD-10-CM | POA: Diagnosis not present

## 2024-01-24 DIAGNOSIS — N189 Chronic kidney disease, unspecified: Secondary | ICD-10-CM | POA: Diagnosis not present

## 2024-01-24 DIAGNOSIS — E1122 Type 2 diabetes mellitus with diabetic chronic kidney disease: Secondary | ICD-10-CM | POA: Diagnosis not present

## 2024-01-24 DIAGNOSIS — M47816 Spondylosis without myelopathy or radiculopathy, lumbar region: Secondary | ICD-10-CM | POA: Diagnosis not present

## 2024-01-24 DIAGNOSIS — N390 Urinary tract infection, site not specified: Secondary | ICD-10-CM | POA: Diagnosis not present

## 2024-01-24 DIAGNOSIS — D72829 Elevated white blood cell count, unspecified: Secondary | ICD-10-CM | POA: Diagnosis not present

## 2024-01-24 DIAGNOSIS — M5442 Lumbago with sciatica, left side: Secondary | ICD-10-CM | POA: Diagnosis not present

## 2024-01-24 DIAGNOSIS — M5432 Sciatica, left side: Secondary | ICD-10-CM | POA: Diagnosis not present

## 2024-01-24 DIAGNOSIS — M79605 Pain in left leg: Secondary | ICD-10-CM | POA: Diagnosis not present

## 2024-01-24 DIAGNOSIS — M4316 Spondylolisthesis, lumbar region: Secondary | ICD-10-CM | POA: Diagnosis not present

## 2024-01-24 DIAGNOSIS — Z9889 Other specified postprocedural states: Secondary | ICD-10-CM | POA: Diagnosis not present

## 2024-01-24 DIAGNOSIS — I129 Hypertensive chronic kidney disease with stage 1 through stage 4 chronic kidney disease, or unspecified chronic kidney disease: Secondary | ICD-10-CM | POA: Insufficient documentation

## 2024-01-24 DIAGNOSIS — M48061 Spinal stenosis, lumbar region without neurogenic claudication: Secondary | ICD-10-CM | POA: Diagnosis not present

## 2024-01-24 DIAGNOSIS — M62838 Other muscle spasm: Secondary | ICD-10-CM | POA: Diagnosis not present

## 2024-01-24 DIAGNOSIS — Z981 Arthrodesis status: Secondary | ICD-10-CM | POA: Diagnosis not present

## 2024-01-24 LAB — BASIC METABOLIC PANEL WITH GFR
Anion gap: 13 (ref 5–15)
BUN: 30 mg/dL — ABNORMAL HIGH (ref 8–23)
CO2: 19 mmol/L — ABNORMAL LOW (ref 22–32)
Calcium: 9 mg/dL (ref 8.9–10.3)
Chloride: 100 mmol/L (ref 98–111)
Creatinine, Ser: 1.3 mg/dL — ABNORMAL HIGH (ref 0.44–1.00)
GFR, Estimated: 44 mL/min — ABNORMAL LOW (ref >60)
Glucose, Bld: 186 mg/dL — ABNORMAL HIGH (ref 70–99)
Potassium: 4 mmol/L (ref 3.5–5.1)
Sodium: 132 mmol/L — ABNORMAL LOW (ref 135–145)

## 2024-01-24 LAB — URINALYSIS, COMPLETE (UACMP) WITH MICROSCOPIC
Bilirubin Urine: NEGATIVE
Glucose, UA: NEGATIVE mg/dL
Hgb urine dipstick: NEGATIVE
Ketones, ur: NEGATIVE mg/dL
Nitrite: NEGATIVE
Protein, ur: NEGATIVE mg/dL
Specific Gravity, Urine: 1.016 (ref 1.005–1.030)
WBC, UA: >50 WBC/hpf (ref 0–5)
pH: 5 (ref 5.0–8.0)

## 2024-01-24 LAB — CBC
HCT: 36.1 % (ref 36.0–46.0)
Hemoglobin: 11.4 g/dL — ABNORMAL LOW (ref 12.0–15.0)
MCH: 26.8 pg (ref 26.0–34.0)
MCHC: 31.6 g/dL (ref 30.0–36.0)
MCV: 84.7 fL (ref 80.0–100.0)
Platelets: 330 10*3/uL (ref 150–400)
RBC: 4.26 MIL/uL (ref 3.87–5.11)
RDW: 13.7 % (ref 11.5–15.5)
WBC: 17.3 10*3/uL — ABNORMAL HIGH (ref 4.0–10.5)
nRBC: 0 % (ref 0.0–0.2)

## 2024-01-24 MED ORDER — SULFAMETHOXAZOLE-TRIMETHOPRIM 800-160 MG PO TABS
1.0000 | ORAL_TABLET | Freq: Once | ORAL | Status: AC
  Administered 2024-01-24: 1 via ORAL
  Filled 2024-01-24: qty 1

## 2024-01-24 MED ORDER — SULFAMETHOXAZOLE-TRIMETHOPRIM 800-160 MG PO TABS: 1.0000 | ORAL_TABLET | Freq: Two times a day (BID) | ORAL | 0 refills | Status: DC

## 2024-01-24 NOTE — ED Triage Notes (Signed)
 Pt is here from [facility] where she is for rehab post lower back surgery and she states that the back surgery was 2.5 weeks ago here at [facility] and has continued to have lower back pain since the surgery and she has pain in her left leg over a week which has been getting worse no swelling in leg, no sob.  No numbness or tingling, she spoke with surgeons PA and was told to come to ED

## 2024-01-24 NOTE — ED Provider Notes (Signed)
 [facility] Provider Note    Event Date/Time   First MD Initiated Contact with Patient 01/24/24 1944     (approximate)  History   Chief Complaint: Leg Pain  HPI  Kristen Kirk is a 73 y.o. female with a past medical history of anemia, CKD, diabetes, hypertension, hyperlipidemia, approximate 2 weeks status post lumbar surgery presents to the emergency department for left leg pain.  Patient has been following up with her neurosurgeon as well as through phone for worsening pain in the left leg.  Patient states during the daytime the pain is minimal however at night specially when lying down the pain increases and radiates down the left leg.  Patient denies any weakness or numbness of the leg.  No incontinence issues.  Patient states because the pain worsened and she decided to come to the emergency department for evaluation.  She did discuss this with her neurosurgeon yesterday who prescribed her a course of steroids however patient's family just picked them up today and the patient has not yet started the steroid medication.  Patient is prescribed oxycodone  at home which she has been using for the pain which has been helpful however the sister states it makes her loopy at times.  Physical Exam   Triage Vital Signs: ED Triage Vitals  Encounter Vitals Group     BP 01/24/24 1827 (!) 136/101     Systolic BP Percentile --      Diastolic BP Percentile --      Pulse Rate 01/24/24 1827 84     Resp 01/24/24 1827 16     Temp 01/24/24 1827 100 F (37.8 C)     Temp Source 01/24/24 1827 Oral     SpO2 01/24/24 1827 100 %     Weight 01/24/24 1831 132 lb (59.9 kg)     Height 01/24/24 1831 5\' 2"  (1.575 m)     Head Circumference --      Peak Flow --      Pain Score 01/24/24 1840 10     Pain Loc --      Pain Education --      Exclude from Growth Chart --     Most recent vital signs: Vitals:   01/24/24 1827  BP: (!) 136/101  Pulse: 84  Resp: 16  Temp: 100 F (37.8  C)  SpO2: 100%    General: Awake, no distress.  CV:  Good peripheral perfusion.  Regular rate and rhythm  Resp:  Normal effort.  Equal breath sounds bilaterally.  Abd:  No distention.  Soft, nontender.  No rebound or guarding. Other:  Well-appearing incisions to the lower back with no dehiscence or erythema, no signs of infection or abscess   ED Results / Procedures / Treatments   RADIOLOGY  I have reviewed the lumbar x-ray images, no obvious abnormalities or broken hardware identified. Radiology states intact hardware.   MEDICATIONS ORDERED IN ED: Medications  sulfamethoxazole -trimethoprim  (BACTRIM  DS) 800-160 MG per tablet 1 tablet (has no administration in time range)     IMPRESSION / MDM / ASSESSMENT AND PLAN / ED COURSE  I reviewed the triage vital signs and the nursing notes.  Patient's presentation is most consistent with acute presentation with potential threat to life or bodily function.  Patient presents emergency department for pain radiating down her left leg has been ongoing since the surgery but she feels like it is getting worse or at least not improving.  Patient has been calling her  neurosurgeon Dr. Felipe Horton he prescribed a course of steroids yesterday but the patient had not yet started taking them yet.  Patient denies any weakness or numbness appears to have good strength on exam.  Patient's white blood cell count is elevated to 17,000 and the patient has not yet started the prednisone .  Chemistry shows no significant finding.  Patient does have a borderline temperature of 100.0.  I spoke to Dr. Jeris Montes of neurosurgery given the symptoms.  He states the patient has a long history of urinary tract infections, however with the low-grade temperature he does not believe that the patient needs any type of surgical intervention at this time.  He states they will follow-up in the office.  He would like us  to check a urinalysis to ensure no UTI.  Patient's urinalysis has  resulted positive for urinary tract infection with greater than 50 white cells as well as white blood cell clumps.  Will cover the course of Bactrim  and send a urine culture.  Patient will follow-up with neurosurgery and start her prednisone  taper tomorrow.  Patient and family are agreeable to this plan.  Discussed return precautions.  FINAL CLINICAL IMPRESSION(S) / ED DIAGNOSES   Urinary tract infection Sciatica   Note:  This document was prepared using Dragon voice recognition software and may include unintentional dictation errors.   Ruth Cove, MD 01/24/24 [id number]

## 2024-01-24 NOTE — Discharge Instructions (Addendum)
 Please continue to take your antibiotic as prescribed for its entire course.  Please begin taking your steroid tomorrow as prescribed by your neurosurgeon.  Please follow-up with your neurosurgeon for further evaluation.  Return to the emergency department for any significant fever or any other symptom concerning to yourself.

## 2024-01-25 ENCOUNTER — Telehealth: Payer: Self-pay | Admitting: Neurosurgery

## 2024-01-25 NOTE — Telephone Encounter (Signed)
 Appointment made with Dr. Felipe Horton. Ok per Dr. Felipe Horton to see patient.   Patient notified and agreeable for appointment.

## 2024-01-25 NOTE — Telephone Encounter (Signed)
  Media Information    L4-S1 maximum access transforaminal lumbar interbody fusion (MAS TLIF) on 01/03/24  Patient did present to the ER.

## 2024-01-25 NOTE — Telephone Encounter (Signed)
 Appt:  01/28/2024

## 2024-01-27 LAB — URINE CULTURE: Culture: 80000 — AB

## 2024-01-28 ENCOUNTER — Encounter: Payer: Self-pay | Admitting: Neurosurgery

## 2024-01-28 ENCOUNTER — Ambulatory Visit (INDEPENDENT_AMBULATORY_CARE_PROVIDER_SITE_OTHER): Admitting: Neurosurgery

## 2024-01-28 VITALS — BP 110/72 | Temp 98.0°F | Ht 62.0 in | Wt 132.0 lb

## 2024-01-28 DIAGNOSIS — G729 Myopathy, unspecified: Secondary | ICD-10-CM | POA: Diagnosis not present

## 2024-01-28 DIAGNOSIS — Z4789 Encounter for other orthopedic aftercare: Secondary | ICD-10-CM | POA: Diagnosis not present

## 2024-01-28 DIAGNOSIS — Z940 Kidney transplant status: Secondary | ICD-10-CM | POA: Diagnosis not present

## 2024-01-28 DIAGNOSIS — M48062 Spinal stenosis, lumbar region with neurogenic claudication: Secondary | ICD-10-CM

## 2024-01-28 DIAGNOSIS — M62838 Other muscle spasm: Secondary | ICD-10-CM | POA: Diagnosis not present

## 2024-01-28 DIAGNOSIS — G4700 Insomnia, unspecified: Secondary | ICD-10-CM | POA: Diagnosis not present

## 2024-01-28 DIAGNOSIS — K5900 Constipation, unspecified: Secondary | ICD-10-CM | POA: Diagnosis not present

## 2024-01-28 DIAGNOSIS — I10 Essential (primary) hypertension: Secondary | ICD-10-CM | POA: Diagnosis not present

## 2024-01-28 DIAGNOSIS — E782 Mixed hyperlipidemia: Secondary | ICD-10-CM | POA: Diagnosis not present

## 2024-01-28 DIAGNOSIS — Z9889 Other specified postprocedural states: Secondary | ICD-10-CM

## 2024-01-28 DIAGNOSIS — K2100 Gastro-esophageal reflux disease with esophagitis, without bleeding: Secondary | ICD-10-CM | POA: Diagnosis not present

## 2024-01-28 DIAGNOSIS — Z7982 Long term (current) use of aspirin: Secondary | ICD-10-CM | POA: Diagnosis not present

## 2024-01-28 DIAGNOSIS — E119 Type 2 diabetes mellitus without complications: Secondary | ICD-10-CM | POA: Diagnosis not present

## 2024-01-28 DIAGNOSIS — H4020X0 Unspecified primary angle-closure glaucoma, stage unspecified: Secondary | ICD-10-CM | POA: Diagnosis not present

## 2024-01-28 DIAGNOSIS — M810 Age-related osteoporosis without current pathological fracture: Secondary | ICD-10-CM | POA: Diagnosis not present

## 2024-01-28 DIAGNOSIS — Z9181 History of falling: Secondary | ICD-10-CM | POA: Diagnosis not present

## 2024-01-28 DIAGNOSIS — M47816 Spondylosis without myelopathy or radiculopathy, lumbar region: Secondary | ICD-10-CM | POA: Diagnosis not present

## 2024-01-28 DIAGNOSIS — Z5982 Transportation insecurity: Secondary | ICD-10-CM | POA: Diagnosis not present

## 2024-01-28 NOTE — Progress Notes (Signed)
   REFERRING PHYSICIAN:  Claire Crick, Md [address] [place],  [place] [address]  DOS: 01/03/24 MIS TLIF at L4-S1, left approach   HISTORY OF PRESENT ILLNESS: Kristen Kirk is approximately 4 weeks status post MIS TLIF at L4 S1. she is doing okay.  She was initially having significant amount of nerve pain postoperatively, however this has decreased considerably especially during the day.  She still does get nighttime symptoms however these are slowly improving.  She is walking better and feeling stronger with rehab.  She continues to have to use intermittent doses of oxycodone  at night for pain.  PHYSICAL EXAMINATION:  General: Patient is well developed, well nourished, calm, collected, and in no apparent distress.   NEUROLOGICAL:  General: In no acute distress.   Awake, alert, oriented to person, place, and time.  Pupils equal round and reactive to light.  Facial tone is symmetric.    Strength:            Side Iliopsoas Quads Hamstring PF DF EHL  R 5 5 5 5 5 5   L 5 5 5 5 5 5    Incision c/d/i   ROS (Neurologic):  Negative except as noted above  IMAGING: Narrative & Impression  CLINICAL DATA:  Postop low back pain   EXAM: LUMBAR SPINE - COMPLETE 4+ VIEW   COMPARISON:  01/03/2024, CT 10/29/2023, radiograph 05/15/2023   FINDINGS: Right-sided sacral stimulator. Status post posterior spinal rods, transpedicular screws and interbody devices at L4-L5 and L5-S1. Intact appearing hardware. Trace retrolisthesis L2 on L3. Vertebral body heights are maintained. Mild disc space narrowing L1-L2 and L2-L3. Aortic atherosclerosis   IMPRESSION: 1. Status post posterior spinal fusion L4 through S1. Intact appearing hardware. 2. Mild degenerative changes at L1-L2 and L2-L3.     Electronically Signed   By: Esmeralda Hedge M.D.   On: 01/24/2024 20:07      ASSESSMENT/PLAN:  Kristen Kirk is approximately 2 weeks status post MIS TLIF at L4 S1.  Her leg pain has  continued to improve.  She is now having mostly nighttime symptoms when she is fully recumbent.  We discussed experimenting with different positions of the bed as this could take some pressure off of her back.  She feels that she is getting stronger with physical therapy.  Will plan to have her continue to follow-up.  We did discuss that with both her age and preoperative status that her expected recovery will be over the course of 3 to 6 months and that she will likely continue to improve up to that time.  She is still quite early in her recovery.  Carroll Clamp, MD Department of neurosurgery

## 2024-01-29 ENCOUNTER — Telehealth: Payer: Self-pay

## 2024-01-29 ENCOUNTER — Telehealth: Payer: Self-pay | Admitting: Family Medicine

## 2024-01-29 DIAGNOSIS — M62838 Other muscle spasm: Secondary | ICD-10-CM | POA: Diagnosis not present

## 2024-01-29 DIAGNOSIS — Z9181 History of falling: Secondary | ICD-10-CM | POA: Diagnosis not present

## 2024-01-29 DIAGNOSIS — G729 Myopathy, unspecified: Secondary | ICD-10-CM | POA: Diagnosis not present

## 2024-01-29 DIAGNOSIS — I10 Essential (primary) hypertension: Secondary | ICD-10-CM | POA: Diagnosis not present

## 2024-01-29 DIAGNOSIS — E782 Mixed hyperlipidemia: Secondary | ICD-10-CM | POA: Diagnosis not present

## 2024-01-29 DIAGNOSIS — K2100 Gastro-esophageal reflux disease with esophagitis, without bleeding: Secondary | ICD-10-CM | POA: Diagnosis not present

## 2024-01-29 DIAGNOSIS — K5900 Constipation, unspecified: Secondary | ICD-10-CM | POA: Diagnosis not present

## 2024-01-29 DIAGNOSIS — M810 Age-related osteoporosis without current pathological fracture: Secondary | ICD-10-CM | POA: Diagnosis not present

## 2024-01-29 DIAGNOSIS — Z7982 Long term (current) use of aspirin: Secondary | ICD-10-CM | POA: Diagnosis not present

## 2024-01-29 DIAGNOSIS — Z5982 Transportation insecurity: Secondary | ICD-10-CM | POA: Diagnosis not present

## 2024-01-29 DIAGNOSIS — H4020X0 Unspecified primary angle-closure glaucoma, stage unspecified: Secondary | ICD-10-CM | POA: Diagnosis not present

## 2024-01-29 DIAGNOSIS — M47816 Spondylosis without myelopathy or radiculopathy, lumbar region: Secondary | ICD-10-CM | POA: Diagnosis not present

## 2024-01-29 DIAGNOSIS — E119 Type 2 diabetes mellitus without complications: Secondary | ICD-10-CM | POA: Diagnosis not present

## 2024-01-29 DIAGNOSIS — Z940 Kidney transplant status: Secondary | ICD-10-CM | POA: Diagnosis not present

## 2024-01-29 DIAGNOSIS — G4700 Insomnia, unspecified: Secondary | ICD-10-CM | POA: Diagnosis not present

## 2024-01-29 DIAGNOSIS — Z4789 Encounter for other orthopedic aftercare: Secondary | ICD-10-CM | POA: Diagnosis not present

## 2024-01-29 NOTE — Telephone Encounter (Signed)
 Copied from CRM [id number]. Topic: Clinical - Home Health Verbal Orders >> Jan 28, 2024  4:11 PM DeAngela L wrote: Caller/Agency: Soni Physical Therapist calling with [facility]  Callback Number: [phone] Service Requested: Physical Therapy Frequency: 1w7 Any new concerns about the patient? No

## 2024-01-29 NOTE — Telephone Encounter (Signed)
 Copied from CRM [id number]. Topic: Clinical - Home Health Verbal Orders >> Jan 29, 2024 10:09 AM Zipporah Him wrote: Caller/Agency: Stephanie/ Callback Number: [phone], ok to leave voicemail Service Requested: Occupational Therapy Frequency: 1w 6 Any new concerns about the patient? No.

## 2024-01-29 NOTE — Telephone Encounter (Signed)
 Agree with this. Thanks.

## 2024-01-29 NOTE — Telephone Encounter (Signed)
 Spoke with Dairl Dry, of [facility], informing him Dr Crissie Dome is giving verbal orders for services requested for pt.

## 2024-01-29 NOTE — Telephone Encounter (Signed)
 Left detailed vm (per message below) informing Kristen Kirk that Dr Crissie Dome is giving verbal orders for OT services requested for pt.

## 2024-01-30 ENCOUNTER — Inpatient Hospital Stay: Admitting: Family Medicine

## 2024-01-30 ENCOUNTER — Encounter: Payer: Self-pay | Admitting: Neurosurgery

## 2024-02-04 ENCOUNTER — Encounter: Payer: Self-pay | Admitting: Neurosurgery

## 2024-02-06 ENCOUNTER — Encounter: Admitting: Neurosurgery

## 2024-02-07 DIAGNOSIS — G729 Myopathy, unspecified: Secondary | ICD-10-CM | POA: Diagnosis not present

## 2024-02-07 DIAGNOSIS — E119 Type 2 diabetes mellitus without complications: Secondary | ICD-10-CM | POA: Diagnosis not present

## 2024-02-07 DIAGNOSIS — K2100 Gastro-esophageal reflux disease with esophagitis, without bleeding: Secondary | ICD-10-CM | POA: Diagnosis not present

## 2024-02-07 DIAGNOSIS — Z7982 Long term (current) use of aspirin: Secondary | ICD-10-CM | POA: Diagnosis not present

## 2024-02-07 DIAGNOSIS — M810 Age-related osteoporosis without current pathological fracture: Secondary | ICD-10-CM | POA: Diagnosis not present

## 2024-02-07 DIAGNOSIS — G4700 Insomnia, unspecified: Secondary | ICD-10-CM | POA: Diagnosis not present

## 2024-02-07 DIAGNOSIS — Z9181 History of falling: Secondary | ICD-10-CM | POA: Diagnosis not present

## 2024-02-07 DIAGNOSIS — Z940 Kidney transplant status: Secondary | ICD-10-CM | POA: Diagnosis not present

## 2024-02-07 DIAGNOSIS — K5900 Constipation, unspecified: Secondary | ICD-10-CM | POA: Diagnosis not present

## 2024-02-07 DIAGNOSIS — E782 Mixed hyperlipidemia: Secondary | ICD-10-CM | POA: Diagnosis not present

## 2024-02-07 DIAGNOSIS — M62838 Other muscle spasm: Secondary | ICD-10-CM | POA: Diagnosis not present

## 2024-02-07 DIAGNOSIS — M47816 Spondylosis without myelopathy or radiculopathy, lumbar region: Secondary | ICD-10-CM | POA: Diagnosis not present

## 2024-02-07 DIAGNOSIS — Z4789 Encounter for other orthopedic aftercare: Secondary | ICD-10-CM | POA: Diagnosis not present

## 2024-02-07 DIAGNOSIS — Z5982 Transportation insecurity: Secondary | ICD-10-CM | POA: Diagnosis not present

## 2024-02-07 DIAGNOSIS — I10 Essential (primary) hypertension: Secondary | ICD-10-CM | POA: Diagnosis not present

## 2024-02-07 DIAGNOSIS — H4020X0 Unspecified primary angle-closure glaucoma, stage unspecified: Secondary | ICD-10-CM | POA: Diagnosis not present

## 2024-02-11 DIAGNOSIS — G729 Myopathy, unspecified: Secondary | ICD-10-CM | POA: Diagnosis not present

## 2024-02-11 DIAGNOSIS — E119 Type 2 diabetes mellitus without complications: Secondary | ICD-10-CM | POA: Diagnosis not present

## 2024-02-11 DIAGNOSIS — Z5982 Transportation insecurity: Secondary | ICD-10-CM | POA: Diagnosis not present

## 2024-02-11 DIAGNOSIS — E782 Mixed hyperlipidemia: Secondary | ICD-10-CM | POA: Diagnosis not present

## 2024-02-11 DIAGNOSIS — Z7982 Long term (current) use of aspirin: Secondary | ICD-10-CM | POA: Diagnosis not present

## 2024-02-11 DIAGNOSIS — Z4789 Encounter for other orthopedic aftercare: Secondary | ICD-10-CM | POA: Diagnosis not present

## 2024-02-11 DIAGNOSIS — H4020X0 Unspecified primary angle-closure glaucoma, stage unspecified: Secondary | ICD-10-CM | POA: Diagnosis not present

## 2024-02-11 DIAGNOSIS — K2100 Gastro-esophageal reflux disease with esophagitis, without bleeding: Secondary | ICD-10-CM | POA: Diagnosis not present

## 2024-02-11 DIAGNOSIS — I10 Essential (primary) hypertension: Secondary | ICD-10-CM | POA: Diagnosis not present

## 2024-02-11 DIAGNOSIS — M47816 Spondylosis without myelopathy or radiculopathy, lumbar region: Secondary | ICD-10-CM | POA: Diagnosis not present

## 2024-02-11 DIAGNOSIS — G4700 Insomnia, unspecified: Secondary | ICD-10-CM | POA: Diagnosis not present

## 2024-02-11 DIAGNOSIS — Z940 Kidney transplant status: Secondary | ICD-10-CM | POA: Diagnosis not present

## 2024-02-11 DIAGNOSIS — K5900 Constipation, unspecified: Secondary | ICD-10-CM | POA: Diagnosis not present

## 2024-02-11 DIAGNOSIS — M810 Age-related osteoporosis without current pathological fracture: Secondary | ICD-10-CM | POA: Diagnosis not present

## 2024-02-11 DIAGNOSIS — Z9181 History of falling: Secondary | ICD-10-CM | POA: Diagnosis not present

## 2024-02-11 DIAGNOSIS — M62838 Other muscle spasm: Secondary | ICD-10-CM | POA: Diagnosis not present

## 2024-02-13 ENCOUNTER — Ambulatory Visit
Admission: RE | Admit: 2024-02-13 | Discharge: 2024-02-13 | Disposition: A | Discharge status: Home / Self Care | Source: Ambulatory Visit | Attending: Neurosurgery | Admitting: Neurosurgery

## 2024-02-13 ENCOUNTER — Ambulatory Visit (INDEPENDENT_AMBULATORY_CARE_PROVIDER_SITE_OTHER): Admitting: Neurosurgery

## 2024-02-13 ENCOUNTER — Encounter: Payer: Self-pay | Admitting: Neurosurgery

## 2024-02-13 ENCOUNTER — Ambulatory Visit
Admission: RE | Admit: 2024-02-13 | Discharge: 2024-02-13 | Disposition: A | Discharge status: Home / Self Care | Source: Skilled Nursing Facility | Attending: Neurosurgery | Admitting: Neurosurgery

## 2024-02-13 VITALS — BP 102/58 | Temp 98.3°F | Ht 62.0 in | Wt 132.0 lb

## 2024-02-13 DIAGNOSIS — M47816 Spondylosis without myelopathy or radiculopathy, lumbar region: Secondary | ICD-10-CM | POA: Diagnosis not present

## 2024-02-13 DIAGNOSIS — Z9889 Other specified postprocedural states: Secondary | ICD-10-CM | POA: Insufficient documentation

## 2024-02-13 DIAGNOSIS — M48062 Spinal stenosis, lumbar region with neurogenic claudication: Secondary | ICD-10-CM | POA: Insufficient documentation

## 2024-02-13 DIAGNOSIS — M4316 Spondylolisthesis, lumbar region: Secondary | ICD-10-CM

## 2024-02-13 DIAGNOSIS — M8580 Other specified disorders of bone density and structure, unspecified site: Secondary | ICD-10-CM | POA: Diagnosis not present

## 2024-02-13 DIAGNOSIS — M5126 Other intervertebral disc displacement, lumbar region: Secondary | ICD-10-CM | POA: Diagnosis not present

## 2024-02-13 DIAGNOSIS — Z4789 Encounter for other orthopedic aftercare: Secondary | ICD-10-CM | POA: Diagnosis not present

## 2024-02-13 DIAGNOSIS — Z981 Arthrodesis status: Secondary | ICD-10-CM | POA: Insufficient documentation

## 2024-02-13 NOTE — Progress Notes (Signed)
   REFERRING PHYSICIAN:  Claire Crick, Md [address] [place],  [place] [address]  DOS: 01/03/24 MIS TLIF at L4-S1, left approach   HISTORY OF PRESENT ILLNESS: Kristen Kirk is approximately 6 weeks status post MIS TLIF at L4 S1. She is starting to feel like she is improving. Her back pain is better, and she's able to stand for longer periods now. She is off pain medication.   PHYSICAL EXAMINATION:  General: Patient is well developed, well nourished, calm, collected, and in no apparent distress.   NEUROLOGICAL:  General: In no acute distress.   Awake, alert, oriented to person, place, and time.  Pupils equal round and reactive to light.  Facial tone is symmetric.    Strength:            Side Iliopsoas Quads Hamstring PF DF EHL  R 5 5 5 5 5 5   L 5 5 5 5 5 5    Incision c/d/i   ROS (Neurologic):  Negative except as noted above  IMAGING: Images awaiting final read, but no major issues noted   ASSESSMENT/PLAN:  Kristen Kirk is approximately 6 weeks status post MIS TLIF at L4 S1.  Her leg pain has continued to improve, and is much better than preop. She does have back pain as expected, but this is also improving. She's doing more with therapy. Her images are stable. She can continue to follow as scheduled.   Carroll Clamp, MD Department of neurosurgery

## 2024-02-14 DIAGNOSIS — M810 Age-related osteoporosis without current pathological fracture: Secondary | ICD-10-CM | POA: Diagnosis not present

## 2024-02-14 DIAGNOSIS — H4020X0 Unspecified primary angle-closure glaucoma, stage unspecified: Secondary | ICD-10-CM | POA: Diagnosis not present

## 2024-02-14 DIAGNOSIS — E782 Mixed hyperlipidemia: Secondary | ICD-10-CM | POA: Diagnosis not present

## 2024-02-14 DIAGNOSIS — M62838 Other muscle spasm: Secondary | ICD-10-CM | POA: Diagnosis not present

## 2024-02-14 DIAGNOSIS — M47816 Spondylosis without myelopathy or radiculopathy, lumbar region: Secondary | ICD-10-CM | POA: Diagnosis not present

## 2024-02-14 DIAGNOSIS — G729 Myopathy, unspecified: Secondary | ICD-10-CM | POA: Diagnosis not present

## 2024-02-14 DIAGNOSIS — K5900 Constipation, unspecified: Secondary | ICD-10-CM | POA: Diagnosis not present

## 2024-02-14 DIAGNOSIS — E119 Type 2 diabetes mellitus without complications: Secondary | ICD-10-CM | POA: Diagnosis not present

## 2024-02-14 DIAGNOSIS — G4700 Insomnia, unspecified: Secondary | ICD-10-CM | POA: Diagnosis not present

## 2024-02-14 DIAGNOSIS — Z5982 Transportation insecurity: Secondary | ICD-10-CM | POA: Diagnosis not present

## 2024-02-14 DIAGNOSIS — K2100 Gastro-esophageal reflux disease with esophagitis, without bleeding: Secondary | ICD-10-CM | POA: Diagnosis not present

## 2024-02-14 DIAGNOSIS — Z4789 Encounter for other orthopedic aftercare: Secondary | ICD-10-CM | POA: Diagnosis not present

## 2024-02-14 DIAGNOSIS — I10 Essential (primary) hypertension: Secondary | ICD-10-CM | POA: Diagnosis not present

## 2024-02-14 DIAGNOSIS — Z9181 History of falling: Secondary | ICD-10-CM | POA: Diagnosis not present

## 2024-02-14 DIAGNOSIS — Z940 Kidney transplant status: Secondary | ICD-10-CM | POA: Diagnosis not present

## 2024-02-14 DIAGNOSIS — Z7982 Long term (current) use of aspirin: Secondary | ICD-10-CM | POA: Diagnosis not present

## 2024-02-18 DIAGNOSIS — Z7982 Long term (current) use of aspirin: Secondary | ICD-10-CM | POA: Diagnosis not present

## 2024-02-18 DIAGNOSIS — Z940 Kidney transplant status: Secondary | ICD-10-CM | POA: Diagnosis not present

## 2024-02-18 DIAGNOSIS — G4700 Insomnia, unspecified: Secondary | ICD-10-CM | POA: Diagnosis not present

## 2024-02-18 DIAGNOSIS — E782 Mixed hyperlipidemia: Secondary | ICD-10-CM | POA: Diagnosis not present

## 2024-02-18 DIAGNOSIS — Z5982 Transportation insecurity: Secondary | ICD-10-CM | POA: Diagnosis not present

## 2024-02-18 DIAGNOSIS — K5900 Constipation, unspecified: Secondary | ICD-10-CM | POA: Diagnosis not present

## 2024-02-18 DIAGNOSIS — M810 Age-related osteoporosis without current pathological fracture: Secondary | ICD-10-CM | POA: Diagnosis not present

## 2024-02-18 DIAGNOSIS — M62838 Other muscle spasm: Secondary | ICD-10-CM | POA: Diagnosis not present

## 2024-02-18 DIAGNOSIS — I10 Essential (primary) hypertension: Secondary | ICD-10-CM | POA: Diagnosis not present

## 2024-02-18 DIAGNOSIS — Z9181 History of falling: Secondary | ICD-10-CM | POA: Diagnosis not present

## 2024-02-18 DIAGNOSIS — K2100 Gastro-esophageal reflux disease with esophagitis, without bleeding: Secondary | ICD-10-CM | POA: Diagnosis not present

## 2024-02-18 DIAGNOSIS — H4020X0 Unspecified primary angle-closure glaucoma, stage unspecified: Secondary | ICD-10-CM | POA: Diagnosis not present

## 2024-02-18 DIAGNOSIS — M47816 Spondylosis without myelopathy or radiculopathy, lumbar region: Secondary | ICD-10-CM | POA: Diagnosis not present

## 2024-02-18 DIAGNOSIS — Z4789 Encounter for other orthopedic aftercare: Secondary | ICD-10-CM | POA: Diagnosis not present

## 2024-02-18 DIAGNOSIS — E119 Type 2 diabetes mellitus without complications: Secondary | ICD-10-CM | POA: Diagnosis not present

## 2024-02-18 DIAGNOSIS — G729 Myopathy, unspecified: Secondary | ICD-10-CM | POA: Diagnosis not present

## 2024-02-20 ENCOUNTER — Ambulatory Visit: Payer: Self-pay | Admitting: Neurosurgery

## 2024-02-21 ENCOUNTER — Telehealth: Payer: Self-pay | Admitting: Family Medicine

## 2024-02-21 DIAGNOSIS — M810 Age-related osteoporosis without current pathological fracture: Secondary | ICD-10-CM | POA: Diagnosis not present

## 2024-02-21 DIAGNOSIS — Z4789 Encounter for other orthopedic aftercare: Secondary | ICD-10-CM | POA: Diagnosis not present

## 2024-02-21 DIAGNOSIS — M47816 Spondylosis without myelopathy or radiculopathy, lumbar region: Secondary | ICD-10-CM | POA: Diagnosis not present

## 2024-02-21 DIAGNOSIS — I10 Essential (primary) hypertension: Secondary | ICD-10-CM | POA: Diagnosis not present

## 2024-02-21 DIAGNOSIS — Z5982 Transportation insecurity: Secondary | ICD-10-CM | POA: Diagnosis not present

## 2024-02-21 DIAGNOSIS — M62838 Other muscle spasm: Secondary | ICD-10-CM | POA: Diagnosis not present

## 2024-02-21 DIAGNOSIS — E119 Type 2 diabetes mellitus without complications: Secondary | ICD-10-CM | POA: Diagnosis not present

## 2024-02-21 DIAGNOSIS — E782 Mixed hyperlipidemia: Secondary | ICD-10-CM | POA: Diagnosis not present

## 2024-02-21 DIAGNOSIS — K2100 Gastro-esophageal reflux disease with esophagitis, without bleeding: Secondary | ICD-10-CM | POA: Diagnosis not present

## 2024-02-21 DIAGNOSIS — G4700 Insomnia, unspecified: Secondary | ICD-10-CM | POA: Diagnosis not present

## 2024-02-21 DIAGNOSIS — Z7982 Long term (current) use of aspirin: Secondary | ICD-10-CM | POA: Diagnosis not present

## 2024-02-21 DIAGNOSIS — Z9181 History of falling: Secondary | ICD-10-CM | POA: Diagnosis not present

## 2024-02-21 DIAGNOSIS — H4020X0 Unspecified primary angle-closure glaucoma, stage unspecified: Secondary | ICD-10-CM | POA: Diagnosis not present

## 2024-02-21 DIAGNOSIS — K5900 Constipation, unspecified: Secondary | ICD-10-CM | POA: Diagnosis not present

## 2024-02-21 DIAGNOSIS — Z940 Kidney transplant status: Secondary | ICD-10-CM | POA: Diagnosis not present

## 2024-02-21 DIAGNOSIS — G729 Myopathy, unspecified: Secondary | ICD-10-CM | POA: Diagnosis not present

## 2024-02-21 NOTE — Telephone Encounter (Signed)
 Copied from CRM [id number]. Topic: Clinical - Home Health Verbal Orders >> Feb 21, 2024  1:33 PM Adonis Hoot wrote: Caller/Agency: Tanya Fantasia Jones/[facility] Callback Number: [phone]  Any new concerns about the patient? Yes Patient has tried melatonin to help her with sleeping ,she's not able  to sleep at all at night. Wakes up at 2 am and can't get to back to sleep.  Home health nurse was wondering if she could be prescribed any medication to help her sleep that works well with her medications that she's taking?

## 2024-02-26 NOTE — Telephone Encounter (Signed)
 If interested, we could try low dose trazodone  which is antidepressant but may help her sleep. Let me know.  She can alternatively try OTC L-theanine amino acid supplement 200mg  capsule as needed for night time awakenings.

## 2024-02-26 NOTE — Telephone Encounter (Signed)
 Spoke to pt. She said she would like to try the trazodone . Asked to have to sent to [facility] [address]

## 2024-02-27 DIAGNOSIS — G4700 Insomnia, unspecified: Secondary | ICD-10-CM | POA: Diagnosis not present

## 2024-02-27 DIAGNOSIS — G729 Myopathy, unspecified: Secondary | ICD-10-CM | POA: Diagnosis not present

## 2024-02-27 DIAGNOSIS — K2100 Gastro-esophageal reflux disease with esophagitis, without bleeding: Secondary | ICD-10-CM | POA: Diagnosis not present

## 2024-02-27 DIAGNOSIS — M47816 Spondylosis without myelopathy or radiculopathy, lumbar region: Secondary | ICD-10-CM | POA: Diagnosis not present

## 2024-02-27 DIAGNOSIS — H4020X0 Unspecified primary angle-closure glaucoma, stage unspecified: Secondary | ICD-10-CM | POA: Diagnosis not present

## 2024-02-27 DIAGNOSIS — E782 Mixed hyperlipidemia: Secondary | ICD-10-CM | POA: Diagnosis not present

## 2024-02-27 DIAGNOSIS — K5900 Constipation, unspecified: Secondary | ICD-10-CM | POA: Diagnosis not present

## 2024-02-27 DIAGNOSIS — M62838 Other muscle spasm: Secondary | ICD-10-CM | POA: Diagnosis not present

## 2024-02-27 DIAGNOSIS — Z7982 Long term (current) use of aspirin: Secondary | ICD-10-CM | POA: Diagnosis not present

## 2024-02-27 DIAGNOSIS — Z940 Kidney transplant status: Secondary | ICD-10-CM | POA: Diagnosis not present

## 2024-02-27 DIAGNOSIS — Z5982 Transportation insecurity: Secondary | ICD-10-CM | POA: Diagnosis not present

## 2024-02-27 DIAGNOSIS — E119 Type 2 diabetes mellitus without complications: Secondary | ICD-10-CM | POA: Diagnosis not present

## 2024-02-27 DIAGNOSIS — I10 Essential (primary) hypertension: Secondary | ICD-10-CM | POA: Diagnosis not present

## 2024-02-27 DIAGNOSIS — Z9181 History of falling: Secondary | ICD-10-CM | POA: Diagnosis not present

## 2024-02-27 DIAGNOSIS — M810 Age-related osteoporosis without current pathological fracture: Secondary | ICD-10-CM | POA: Diagnosis not present

## 2024-02-27 DIAGNOSIS — Z4789 Encounter for other orthopedic aftercare: Secondary | ICD-10-CM | POA: Diagnosis not present

## 2024-02-27 MED ORDER — TRAZODONE HCL 50 MG PO TABS: 25.0000 mg | ORAL_TABLET | Freq: Every evening | ORAL | 3 refills | Status: AC | PRN

## 2024-02-27 NOTE — Addendum Note (Signed)
 Addended by: Claire Crick on: 02/27/2024 08:02 AM   Modules accepted: Orders

## 2024-02-27 NOTE — Telephone Encounter (Signed)
 ERx

## 2024-02-29 DIAGNOSIS — J0100 Acute maxillary sinusitis, unspecified: Secondary | ICD-10-CM | POA: Diagnosis not present

## 2024-02-29 DIAGNOSIS — J069 Acute upper respiratory infection, unspecified: Secondary | ICD-10-CM | POA: Diagnosis not present

## 2024-02-29 DIAGNOSIS — J040 Acute laryngitis: Secondary | ICD-10-CM | POA: Diagnosis not present

## 2024-02-29 DIAGNOSIS — Z03818 Encounter for observation for suspected exposure to other biological agents ruled out: Secondary | ICD-10-CM | POA: Diagnosis not present

## 2024-03-03 DIAGNOSIS — Z961 Presence of intraocular lens: Secondary | ICD-10-CM | POA: Diagnosis not present

## 2024-03-03 DIAGNOSIS — H401133 Primary open-angle glaucoma, bilateral, severe stage: Secondary | ICD-10-CM | POA: Diagnosis not present

## 2024-03-03 DIAGNOSIS — H401123 Primary open-angle glaucoma, left eye, severe stage: Secondary | ICD-10-CM | POA: Diagnosis not present

## 2024-03-04 DIAGNOSIS — D84821 Immunodeficiency due to drugs: Secondary | ICD-10-CM | POA: Diagnosis not present

## 2024-03-04 DIAGNOSIS — N189 Chronic kidney disease, unspecified: Secondary | ICD-10-CM | POA: Diagnosis not present

## 2024-03-05 DIAGNOSIS — K5900 Constipation, unspecified: Secondary | ICD-10-CM | POA: Diagnosis not present

## 2024-03-05 DIAGNOSIS — Z7982 Long term (current) use of aspirin: Secondary | ICD-10-CM | POA: Diagnosis not present

## 2024-03-05 DIAGNOSIS — Z5982 Transportation insecurity: Secondary | ICD-10-CM | POA: Diagnosis not present

## 2024-03-05 DIAGNOSIS — H4020X0 Unspecified primary angle-closure glaucoma, stage unspecified: Secondary | ICD-10-CM | POA: Diagnosis not present

## 2024-03-05 DIAGNOSIS — K2100 Gastro-esophageal reflux disease with esophagitis, without bleeding: Secondary | ICD-10-CM | POA: Diagnosis not present

## 2024-03-05 DIAGNOSIS — Z4789 Encounter for other orthopedic aftercare: Secondary | ICD-10-CM | POA: Diagnosis not present

## 2024-03-05 DIAGNOSIS — M47816 Spondylosis without myelopathy or radiculopathy, lumbar region: Secondary | ICD-10-CM | POA: Diagnosis not present

## 2024-03-05 DIAGNOSIS — Z9181 History of falling: Secondary | ICD-10-CM | POA: Diagnosis not present

## 2024-03-05 DIAGNOSIS — Z940 Kidney transplant status: Secondary | ICD-10-CM | POA: Diagnosis not present

## 2024-03-05 DIAGNOSIS — I10 Essential (primary) hypertension: Secondary | ICD-10-CM | POA: Diagnosis not present

## 2024-03-05 DIAGNOSIS — M810 Age-related osteoporosis without current pathological fracture: Secondary | ICD-10-CM | POA: Diagnosis not present

## 2024-03-05 DIAGNOSIS — G729 Myopathy, unspecified: Secondary | ICD-10-CM | POA: Diagnosis not present

## 2024-03-05 DIAGNOSIS — G4700 Insomnia, unspecified: Secondary | ICD-10-CM | POA: Diagnosis not present

## 2024-03-05 DIAGNOSIS — M62838 Other muscle spasm: Secondary | ICD-10-CM | POA: Diagnosis not present

## 2024-03-05 DIAGNOSIS — E119 Type 2 diabetes mellitus without complications: Secondary | ICD-10-CM | POA: Diagnosis not present

## 2024-03-05 DIAGNOSIS — E782 Mixed hyperlipidemia: Secondary | ICD-10-CM | POA: Diagnosis not present

## 2024-03-06 DIAGNOSIS — H4020X0 Unspecified primary angle-closure glaucoma, stage unspecified: Secondary | ICD-10-CM | POA: Diagnosis not present

## 2024-03-06 DIAGNOSIS — M62838 Other muscle spasm: Secondary | ICD-10-CM | POA: Diagnosis not present

## 2024-03-06 DIAGNOSIS — E782 Mixed hyperlipidemia: Secondary | ICD-10-CM | POA: Diagnosis not present

## 2024-03-06 DIAGNOSIS — M810 Age-related osteoporosis without current pathological fracture: Secondary | ICD-10-CM | POA: Diagnosis not present

## 2024-03-06 DIAGNOSIS — Z7982 Long term (current) use of aspirin: Secondary | ICD-10-CM | POA: Diagnosis not present

## 2024-03-06 DIAGNOSIS — E119 Type 2 diabetes mellitus without complications: Secondary | ICD-10-CM | POA: Diagnosis not present

## 2024-03-06 DIAGNOSIS — G729 Myopathy, unspecified: Secondary | ICD-10-CM | POA: Diagnosis not present

## 2024-03-06 DIAGNOSIS — K5900 Constipation, unspecified: Secondary | ICD-10-CM | POA: Diagnosis not present

## 2024-03-06 DIAGNOSIS — I10 Essential (primary) hypertension: Secondary | ICD-10-CM | POA: Diagnosis not present

## 2024-03-06 DIAGNOSIS — Z5982 Transportation insecurity: Secondary | ICD-10-CM | POA: Diagnosis not present

## 2024-03-06 DIAGNOSIS — Z9181 History of falling: Secondary | ICD-10-CM | POA: Diagnosis not present

## 2024-03-06 DIAGNOSIS — Z940 Kidney transplant status: Secondary | ICD-10-CM | POA: Diagnosis not present

## 2024-03-06 DIAGNOSIS — Z4789 Encounter for other orthopedic aftercare: Secondary | ICD-10-CM | POA: Diagnosis not present

## 2024-03-06 DIAGNOSIS — K2100 Gastro-esophageal reflux disease with esophagitis, without bleeding: Secondary | ICD-10-CM | POA: Diagnosis not present

## 2024-03-06 DIAGNOSIS — G4700 Insomnia, unspecified: Secondary | ICD-10-CM | POA: Diagnosis not present

## 2024-03-06 DIAGNOSIS — M47816 Spondylosis without myelopathy or radiculopathy, lumbar region: Secondary | ICD-10-CM | POA: Diagnosis not present

## 2024-03-13 DIAGNOSIS — Z940 Kidney transplant status: Secondary | ICD-10-CM | POA: Diagnosis not present

## 2024-03-13 DIAGNOSIS — Z4789 Encounter for other orthopedic aftercare: Secondary | ICD-10-CM | POA: Diagnosis not present

## 2024-03-13 DIAGNOSIS — R351 Nocturia: Secondary | ICD-10-CM | POA: Diagnosis not present

## 2024-03-13 DIAGNOSIS — G729 Myopathy, unspecified: Secondary | ICD-10-CM | POA: Diagnosis not present

## 2024-03-13 DIAGNOSIS — H4020X0 Unspecified primary angle-closure glaucoma, stage unspecified: Secondary | ICD-10-CM | POA: Diagnosis not present

## 2024-03-13 DIAGNOSIS — M62838 Other muscle spasm: Secondary | ICD-10-CM | POA: Diagnosis not present

## 2024-03-13 DIAGNOSIS — N3000 Acute cystitis without hematuria: Secondary | ICD-10-CM | POA: Diagnosis not present

## 2024-03-13 DIAGNOSIS — E782 Mixed hyperlipidemia: Secondary | ICD-10-CM | POA: Diagnosis not present

## 2024-03-13 DIAGNOSIS — I10 Essential (primary) hypertension: Secondary | ICD-10-CM | POA: Diagnosis not present

## 2024-03-13 DIAGNOSIS — Z5982 Transportation insecurity: Secondary | ICD-10-CM | POA: Diagnosis not present

## 2024-03-13 DIAGNOSIS — G4700 Insomnia, unspecified: Secondary | ICD-10-CM | POA: Diagnosis not present

## 2024-03-13 DIAGNOSIS — E119 Type 2 diabetes mellitus without complications: Secondary | ICD-10-CM | POA: Diagnosis not present

## 2024-03-13 DIAGNOSIS — Z9181 History of falling: Secondary | ICD-10-CM | POA: Diagnosis not present

## 2024-03-13 DIAGNOSIS — M810 Age-related osteoporosis without current pathological fracture: Secondary | ICD-10-CM | POA: Diagnosis not present

## 2024-03-13 DIAGNOSIS — M47816 Spondylosis without myelopathy or radiculopathy, lumbar region: Secondary | ICD-10-CM | POA: Diagnosis not present

## 2024-03-13 DIAGNOSIS — K5900 Constipation, unspecified: Secondary | ICD-10-CM | POA: Diagnosis not present

## 2024-03-13 DIAGNOSIS — Z7982 Long term (current) use of aspirin: Secondary | ICD-10-CM | POA: Diagnosis not present

## 2024-03-13 DIAGNOSIS — K2100 Gastro-esophageal reflux disease with esophagitis, without bleeding: Secondary | ICD-10-CM | POA: Diagnosis not present

## 2024-03-14 DIAGNOSIS — N3000 Acute cystitis without hematuria: Secondary | ICD-10-CM | POA: Diagnosis not present

## 2024-03-17 ENCOUNTER — Encounter: Admitting: Physician Assistant

## 2024-03-21 ENCOUNTER — Other Ambulatory Visit: Payer: Self-pay | Admitting: Family Medicine

## 2024-03-21 DIAGNOSIS — M48062 Spinal stenosis, lumbar region with neurogenic claudication: Secondary | ICD-10-CM

## 2024-03-24 ENCOUNTER — Ambulatory Visit (INDEPENDENT_AMBULATORY_CARE_PROVIDER_SITE_OTHER): Admitting: Physician Assistant

## 2024-03-24 ENCOUNTER — Encounter: Payer: Self-pay | Admitting: Physician Assistant

## 2024-03-24 ENCOUNTER — Ambulatory Visit
Admission: RE | Admit: 2024-03-24 | Discharge: 2024-03-24 | Disposition: A | Discharge status: Home / Self Care | Source: Ambulatory Visit | Attending: Physician Assistant | Admitting: Physician Assistant

## 2024-03-24 ENCOUNTER — Ambulatory Visit
Admission: RE | Admit: 2024-03-24 | Discharge: 2024-03-24 | Disposition: A | Discharge status: Home / Self Care | Attending: Physician Assistant | Admitting: Physician Assistant

## 2024-03-24 VITALS — BP 118/60 | Ht 63.0 in | Wt 128.6 lb

## 2024-03-24 DIAGNOSIS — M47814 Spondylosis without myelopathy or radiculopathy, thoracic region: Secondary | ICD-10-CM | POA: Diagnosis not present

## 2024-03-24 DIAGNOSIS — R2681 Unsteadiness on feet: Secondary | ICD-10-CM

## 2024-03-24 DIAGNOSIS — M4316 Spondylolisthesis, lumbar region: Secondary | ICD-10-CM

## 2024-03-24 DIAGNOSIS — Z940 Kidney transplant status: Secondary | ICD-10-CM | POA: Diagnosis not present

## 2024-03-24 DIAGNOSIS — M47816 Spondylosis without myelopathy or radiculopathy, lumbar region: Secondary | ICD-10-CM | POA: Diagnosis not present

## 2024-03-24 DIAGNOSIS — I700 Atherosclerosis of aorta: Secondary | ICD-10-CM | POA: Diagnosis not present

## 2024-03-24 DIAGNOSIS — M48062 Spinal stenosis, lumbar region with neurogenic claudication: Secondary | ICD-10-CM | POA: Insufficient documentation

## 2024-03-24 DIAGNOSIS — Z981 Arthrodesis status: Secondary | ICD-10-CM | POA: Diagnosis not present

## 2024-03-24 DIAGNOSIS — Z09 Encounter for follow-up examination after completed treatment for conditions other than malignant neoplasm: Secondary | ICD-10-CM

## 2024-03-24 NOTE — Progress Notes (Signed)
   REFERRING PHYSICIAN:  Rilla Baller, Md [address] [place],  [place] [address]  DOS: 01/03/24 MIS TLIF at L4-S1, left approach   HISTORY OF PRESENT ILLNESS: Kristen Kirk is approximately 11 weeks status post MIS TLIF at L4 S1.  Overall she is doing pretty well.  She has no back pain she has no pain that radiates down her left leg.  She denies any numbness and tingling.  However, she does feel as though her left leg at times feels a little sore and weak.  Due to that she feels as though her gait is a bit off and she is little bit off balance.  She was having a physical therapist coming to her house, but that has stopped and she has been doing some home exercises that she does not feel as though she is progressing with.   PHYSICAL EXAMINATION:  General: Patient is well developed, well nourished, calm, collected, and in no apparent distress.   NEUROLOGICAL:  General: In no acute distress.   Awake, alert, oriented to person, place, and time.  Pupils equal round and reactive to light.  Facial tone is symmetric.    Strength:            Side Iliopsoas Quads Hamstring PF DF EHL  R 5 5 5 5 5 5   L 5 5 5 5 5 5    Incision c/d/i   ROS (Neurologic):  Negative except as noted above  IMAGING: Images awaiting final read, but no major issues noted   ASSESSMENT/PLAN:  Kristen Kirk is approximately 11 weeks status post MIS TLIF at L4 S1.  Overall she is doing pretty well.  She has no back pain she has no pain that radiates down her left leg.  She denies any numbness and tingling.  However, she does feel as though her left leg at times feels a little sore and weak.  Due to that she feels as though her gait is a bit off and she is little bit off balance.  She was having a physical therapist coming to her house, but that has stopped and she has been doing some home exercises that she does not feel as though she is progressing with.  Pleasure to see patient in clinic today.  I have  ordered outpatient physical therapy to try to have her further progress with her gait.  Referral has been sent.  No scheduled follow-up but encourage patient to reach out to us  for any thing she may need in the future.  Lyle Decamp, PA-C Department of neurosurgery

## 2024-03-26 ENCOUNTER — Ambulatory Visit: Payer: Medicare Other | Admitting: Family Medicine

## 2024-03-26 ENCOUNTER — Encounter: Payer: Self-pay | Admitting: Family Medicine

## 2024-03-26 ENCOUNTER — Ambulatory Visit (INDEPENDENT_AMBULATORY_CARE_PROVIDER_SITE_OTHER)
Admission: RE | Admit: 2024-03-26 | Discharge: 2024-03-26 | Disposition: A | Discharge status: Home / Self Care | Source: Ambulatory Visit | Attending: Family Medicine | Admitting: Family Medicine

## 2024-03-26 VITALS — BP 132/74 | HR 81 | Temp 98.6°F | Ht 63.0 in | Wt 128.2 lb

## 2024-03-26 DIAGNOSIS — Z940 Kidney transplant status: Secondary | ICD-10-CM

## 2024-03-26 DIAGNOSIS — J22 Unspecified acute lower respiratory infection: Secondary | ICD-10-CM

## 2024-03-26 DIAGNOSIS — I10 Essential (primary) hypertension: Secondary | ICD-10-CM

## 2024-03-26 DIAGNOSIS — Z981 Arthrodesis status: Secondary | ICD-10-CM | POA: Diagnosis not present

## 2024-03-26 DIAGNOSIS — E1169 Type 2 diabetes mellitus with other specified complication: Secondary | ICD-10-CM

## 2024-03-26 DIAGNOSIS — H401133 Primary open-angle glaucoma, bilateral, severe stage: Secondary | ICD-10-CM

## 2024-03-26 DIAGNOSIS — R059 Cough, unspecified: Secondary | ICD-10-CM | POA: Diagnosis not present

## 2024-03-26 DIAGNOSIS — D849 Immunodeficiency, unspecified: Secondary | ICD-10-CM

## 2024-03-26 DIAGNOSIS — G4700 Insomnia, unspecified: Secondary | ICD-10-CM

## 2024-03-26 LAB — POCT GLYCOSYLATED HEMOGLOBIN (HGB A1C): Hemoglobin A1C: 6.7 % — AB (ref 4.0–5.6)

## 2024-03-26 MED ORDER — PROMETHAZINE-DM 6.25-15 MG/5ML PO SYRP: 2.5000 mL | ORAL_SOLUTION | Freq: Two times a day (BID) | ORAL | 0 refills | Status: DC | PRN

## 2024-03-26 MED ORDER — DOXYCYCLINE HYCLATE 100 MG PO TABS: 100.0000 mg | ORAL_TABLET | Freq: Two times a day (BID) | ORAL | 0 refills | Status: DC

## 2024-03-26 NOTE — Assessment & Plan Note (Signed)
 Recovering well - has been referred to outpatient PT

## 2024-03-26 NOTE — Progress Notes (Signed)
 Ph: [phone] Fax: [phone]   Patient ID: Kristen Kirk, female    DOB: 1951-08-29, 73 y.o.   MRN: [id number]  This visit was conducted in person.  BP 132/74   Pulse 81   Temp 98.6 F (37 C) (Oral)   Ht 5' 3 (1.6 m)   Wt 128 lb 4 oz (58.2 kg)   LMP  (LMP Unknown)   SpO2 98%   BMI 22.72 kg/m    CC: 4 mo DM f/u visit  Subjective:   HPI: Kristen Kirk is a 73 y.o. female presenting on 03/26/2024 for Medical Management of Chronic Issues (Here for 4 mo DM f/u. Wants to discuss HCTZ. Also, c/o prod cough and chills. Sxs started about 2 wks ago. Seen and treated at [facility]. Sxs have not improved. Pt accompanied by sister, Eva. )   1 mo h/o cold symptoms, productive cough. Seen at [facility] treated with phenergan  cough syrup as well as doxycycline 7d course. Ongoing symptoms. Feels feverish, chills, ST, but no ear or tooth pain, sinus pressure headache, PNdrainage. Coughing up yellow mucous.   S/p kidney transplant 04/2017 on prograf  and mycophenolate . Last seen Dr Leobardo 11/2023  Insomnia - trazodone  25mg  started 01/2024 - this has helped some.   Glaucoma followed by Dr Bethany at [facility] center in [place]. Upcoming R eye surgery on Friday.   S/p MIS TLIF (minimally invasive transforaminal lumbar interbody fusion) at L4/S1 for spinal lumbar stenosis by Dr Claudene, saw [facility] in follow up on Monday - has been referred again to outpatient physical therapy.   DM - does not regularly check sugars, last fasting check was 126 recently. Compliant with antihyperglycemic regimen which includes: diet controlled. Denies low sugars or hypoglycemic symptoms. Denies paresthesias, blurry vision. Last diabetic eye exam 07/2023. Glucometer brand: one touch ultra. Last foot exam: 11/2023. DSME: referred 11/2023 but has not been seen. Lab Results  Component Value Date   HGBA1C 6.7 (A) 03/26/2024   Diabetic Foot Exam - Simple   No data  filed    Lab Results  Component Value Date   MICROALBUR 7.6 (H) 08/14/2023         Relevant past medical, surgical, family and social history reviewed and updated as indicated. Interim medical history since our last visit reviewed. Allergies and medications reviewed and updated. Outpatient Medications Prior to Visit  Medication Sig Dispense Refill   acetaminophen  (TYLENOL ) 500 MG tablet Take 500 mg by mouth 3 (three) times daily as needed for moderate pain (pain score 4-6) or mild pain (pain score 1-3).     alendronate  (FOSAMAX ) 70 MG tablet Take 1 tablet (70 mg total) by mouth every 7 (seven) days. Take with a full glass of water  on an empty stomach. 12 tablet 4   amLODipine  (NORVASC ) 2.5 MG tablet Take 2.5 mg by mouth daily.     apraclonidine  (IOPIDINE ) 0.5 % ophthalmic solution Place 1 drop into the right eye in the morning and at bedtime.     aspirin  EC 81 MG tablet Take 81 mg by mouth daily.     Blood Glucose Monitoring Suppl (ONE TOUCH ULTRA SYSTEM KIT) W/DEVICE KIT 1 kit by Does not apply route once. 1 each 0   Carboxymethylcellulose Sodium 0.25 % SOLN Place 1 drop into both eyes as needed.     carvedilol  (COREG ) 25 MG tablet Take 12.5 mg by mouth 2 (two) times daily with a meal.  Cholecalciferol  (VITAMIN D ) 50 MCG (2000 UT) CAPS Take 2,000 Units by mouth daily.     docusate sodium  (COLACE) 100 MG capsule Take 100 mg by mouth 2 (two) times daily as needed for mild constipation or moderate constipation.  0   dorzolamide -timolol  (COSOPT ) 2-0.5 % ophthalmic solution Place 1 drop into both eyes 2 (two) times daily.     gabapentin  (NEURONTIN ) 300 MG capsule Take 1 capsule (300 mg total) by mouth 3 (three) times daily.     glucose blood test strip Check sugars once daily 100 each 11   hydrochlorothiazide  (HYDRODIURIL ) 12.5 MG tablet Take 1 tablet (12.5 mg total) by mouth daily. 90 tablet 4   latanoprost  (XALATAN ) 0.005 % ophthalmic solution Place 1 drop into the right eye at bedtime.      Melatonin 3 MG CAPS Take 1-2 capsules (3-6 mg total) by mouth at bedtime as needed (sleep).  0   methocarbamol  (ROBAXIN ) 500 MG tablet Take 1 tablet (500 mg total) by mouth every 6 (six) hours as needed for muscle spasms.     Multiple Vitamin (MULTIVITAMIN) tablet Take 1 tablet by mouth daily.     mycophenolate  (CELLCEPT ) 500 MG tablet Take 500 mg by mouth 2 (two) times daily.     pantoprazole  (PROTONIX ) 20 MG tablet Take 1 tablet (20 mg total) by mouth daily. 90 tablet 4   pravastatin  (PRAVACHOL ) 80 MG tablet Take 1 tablet (80 mg total) by mouth every evening. 90 tablet 4   prednisoLONE  acetate (PRED FORTE ) 1 % ophthalmic suspension Place 1 drop into the left eye in the morning, at noon, and at bedtime.     senna (SENOKOT) 8.6 MG TABS tablet Take 1 tablet (8.6 mg total) by mouth 2 (two) times daily.     tacrolimus  (PROGRAF ) 0.5 MG capsule Take 0.5-1 mg by mouth See admin instructions. Take 2 capsules (1mg ) by mouth every morning and take 1 capsule (0.5mg ) by mouth every night     traZODone  (DESYREL ) 50 MG tablet Take 0.5-1 tablets (25-50 mg total) by mouth at bedtime as needed for sleep. 30 tablet 3   No facility-administered medications prior to visit.     Per HPI unless specifically indicated in ROS section below Review of Systems  Objective:  BP 132/74   Pulse 81   Temp 98.6 F (37 C) (Oral)   Ht 5' 3 (1.6 m)   Wt 128 lb 4 oz (58.2 kg)   LMP  (LMP Unknown)   SpO2 98%   BMI 22.72 kg/m   Wt Readings from Last 3 Encounters:  03/26/24 128 lb 4 oz (58.2 kg)  03/24/24 128 lb 9.6 oz (58.3 kg)  02/13/24 132 lb (59.9 kg)      Physical Exam Vitals and nursing note reviewed.  Constitutional:      Appearance: Normal appearance. She is not ill-appearing.  HENT:     Head: Normocephalic and atraumatic.     Right Ear: Tympanic membrane, ear canal and external ear normal. There is no impacted cerumen.     Left Ear: Tympanic membrane, ear canal and external ear normal. There is no  impacted cerumen.     Nose: Congestion present.     Right Sinus: No maxillary sinus tenderness or frontal sinus tenderness.     Left Sinus: No maxillary sinus tenderness or frontal sinus tenderness.     Comments: Wearing mask    Mouth/Throat:     Mouth: Mucous membranes are moist.     Pharynx: Oropharynx is  clear. No oropharyngeal exudate or posterior oropharyngeal erythema.   Eyes:     Extraocular Movements: Extraocular movements intact.     Conjunctiva/sclera: Conjunctivae normal.     Pupils: Pupils are equal, round, and reactive to light.    Cardiovascular:     Rate and Rhythm: Normal rate and regular rhythm.     Pulses: Normal pulses.     Heart sounds: Normal heart sounds. No murmur heard. Pulmonary:     Effort: Pulmonary effort is normal. No respiratory distress.     Breath sounds: Normal breath sounds. No wheezing, rhonchi or rales.  Lymphadenopathy:     Head:     Right side of head: No submental, submandibular, tonsillar, preauricular or posterior auricular adenopathy.     Left side of head: No submental, submandibular, tonsillar, preauricular or posterior auricular adenopathy.     Cervical: No cervical adenopathy.     Right cervical: No superficial cervical adenopathy.    Left cervical: No superficial cervical adenopathy.     Upper Body:     Right upper body: No supraclavicular adenopathy.     Left upper body: No supraclavicular adenopathy.   Skin:    Findings: No rash.   Neurological:     Mental Status: She is alert.   Psychiatric:        Mood and Affect: Mood normal.        Behavior: Behavior normal.       Results for orders placed or performed in visit on 03/26/24  POCT glycosylated hemoglobin (Hb A1C)   Collection Time: 03/26/24  9:12 AM  Result Value Ref Range   Hemoglobin A1C 6.7 (A) 4.0 - 5.6 %   HbA1c POC (<> result, manual entry)     HbA1c, POC (prediabetic range)     HbA1c, POC (controlled diabetic range)      Assessment & Plan:   Problem  List Items Addressed This Visit     History of renal transplant (Chronic)   Immunosuppressed status (HCC) (Chronic)   Primary open angle glaucoma (POAG) of both eyes, severe stage   Sees Dr Bethany - planning glaucoma surgery to R eye       Type 2 diabetes mellitus with other specified complication (HCC)   Chronic, stable diet controlled. Reviewed renewed efforts to follow diabetic diet.  DSME referral placed 11/2023, she has not seen however      Relevant Orders   POCT glycosylated hemoglobin (Hb A1C) (Completed)   Insomnia   Melatonin lost effect Back on trazodone  25mg  nightly with benefit.      Essential hypertension   Notes frequent urination - will try hydrochlorothiazide  every other day Dosing.       S/P lumbar fusion   Recovering well - has been referred to outpatient PT      Acute respiratory infection - Primary   Ongoing for 4 weeks. Initial improvement on doxycycline 7d course but symptoms then recurred. Normal vitals today, lungs clear on exam.  She is immunocompromised.  Will update CXR and repeat doxycycline course, 10d this time.  Refilled phenergan  cough syrup. Update if not improving with treatment.  Azithromycin  interacts with cellcept .       Relevant Orders   DG Chest 2 View     Meds ordered this encounter  Medications   promethazine -dextromethorphan (PROMETHAZINE -DM) 6.25-15 MG/5ML syrup    Sig: Take 2.5-5 mLs by mouth 2 (two) times daily as needed for cough (sedation precautions).    Dispense:  118 mL  Refill:  0   doxycycline (VIBRA-TABS) 100 MG tablet    Sig: Take 1 tablet (100 mg total) by mouth 2 (two) times daily.    Dispense:  20 tablet    Refill:  0    Orders Placed This Encounter  Procedures   DG Chest 2 View    Standing Status:   Future    Number of Occurrences:   1    Expiration Date:   03/26/2025    Reason for Exam (SYMPTOM  OR DIAGNOSIS REQUIRED):   ongoing cough x 1 month, no improvement after doxycycline abx course     Preferred imaging location?:    [facility]   POCT glycosylated hemoglobin (Hb A1C)    Patient Instructions  Continue trazodone  1/2 pill for sleep.  Try cutting down on hydrochlorothiazide  to every other day, monitor blood pressures with this change.  A1c is doing well at 6.7%. Continue working on low sugar low carb diabetic diet to control sugars.  For cough - possible ongoing bronchitis. Xray today and treat with another course of doxycycline antibiotic. I have also refilled the phenergan  cough to use at night time.  Return in 5 months for physical sooner if needed  Follow up plan: Return in about 5 months (around 08/26/2024) for annual exam, prior fasting for blood work, medicare wellness visit.  Anton Blas, MD

## 2024-03-26 NOTE — Patient Instructions (Addendum)
 Continue trazodone  1/2 pill for sleep.  Try cutting down on hydrochlorothiazide  to every other day, monitor blood pressures with this change.  A1c is doing well at 6.7%. Continue working on low sugar low carb diabetic diet to control sugars.  For cough - possible ongoing bronchitis. Xray today and treat with another course of doxycycline antibiotic. I have also refilled the phenergan  cough to use at night time.  Return in 5 months for physical sooner if needed

## 2024-03-26 NOTE — Assessment & Plan Note (Signed)
 Melatonin lost effect Back on trazodone  25mg  nightly with benefit.

## 2024-03-26 NOTE — Assessment & Plan Note (Signed)
 Sees Dr Bethany - planning glaucoma surgery to R eye

## 2024-03-26 NOTE — Assessment & Plan Note (Signed)
 Notes frequent urination - will try hydrochlorothiazide  every other day Dosing.

## 2024-03-26 NOTE — Assessment & Plan Note (Signed)
 Ongoing for 4 weeks. Initial improvement on doxycycline 7d course but symptoms then recurred. Normal vitals today, lungs clear on exam.  She is immunocompromised.  Will update CXR and repeat doxycycline course, 10d this time.  Refilled phenergan  cough syrup. Update if not improving with treatment.  Azithromycin  interacts with cellcept .

## 2024-03-26 NOTE — Assessment & Plan Note (Addendum)
 Chronic, stable diet controlled. Reviewed renewed efforts to follow diabetic diet.  DSME referral placed 11/2023, she has not seen however

## 2024-03-27 ENCOUNTER — Ambulatory Visit: Payer: Self-pay | Admitting: Family Medicine

## 2024-04-16 DIAGNOSIS — Z87891 Personal history of nicotine dependence: Secondary | ICD-10-CM | POA: Diagnosis not present

## 2024-04-16 DIAGNOSIS — Z940 Kidney transplant status: Secondary | ICD-10-CM | POA: Diagnosis not present

## 2024-04-16 DIAGNOSIS — E1139 Type 2 diabetes mellitus with other diabetic ophthalmic complication: Secondary | ICD-10-CM | POA: Diagnosis not present

## 2024-04-16 DIAGNOSIS — K219 Gastro-esophageal reflux disease without esophagitis: Secondary | ICD-10-CM | POA: Diagnosis not present

## 2024-04-16 DIAGNOSIS — Z7982 Long term (current) use of aspirin: Secondary | ICD-10-CM | POA: Diagnosis not present

## 2024-04-16 DIAGNOSIS — H401113 Primary open-angle glaucoma, right eye, severe stage: Secondary | ICD-10-CM | POA: Diagnosis not present

## 2024-04-16 DIAGNOSIS — Z79899 Other long term (current) drug therapy: Secondary | ICD-10-CM | POA: Diagnosis not present

## 2024-04-16 DIAGNOSIS — H42 Glaucoma in diseases classified elsewhere: Secondary | ICD-10-CM | POA: Diagnosis not present

## 2024-04-16 DIAGNOSIS — E782 Mixed hyperlipidemia: Secondary | ICD-10-CM | POA: Diagnosis not present

## 2024-04-16 DIAGNOSIS — I10 Essential (primary) hypertension: Secondary | ICD-10-CM | POA: Diagnosis not present

## 2024-04-17 NOTE — Progress Notes (Signed)
 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 Advanced POAG OS > OD Severe stage OS Moderate OD.   OD:  s/p Trab with MMC (3 min) OD 04/13/12 s/p 5-FU #2  - HVF with flux, sup arcuate seen in 2015, but inferior changes not present today  OS:  ONH S/P hx Drance heme s/p SLT OS 2008, s/p SLT OS 05/29/11  Prior IOP 25, 26 on travatan  OU QHS, Cosopt  BID OU (per Dr Ivonne)  s/p trab/MMC OS (08/30/11) s/p LSL #1 (09/04/11), #2 and #3 (09/14/11) s/p 5FU #5 (09/28/11). HVF with progression.  - HVF total depression, but mean deviation stable  Current meds: cosopt  BID OU, latanoprost  qhs OS Allergy to brimonidine B12 525 and folate >25.4 taken 4/15 WNL. Pt reports BP in normal range- not checked daily but pt brought her BP for the 11 days that she checked (mostly in the AM, some in PM) - range from systolic 108-161/ diastolic 51-75 .   Pt is S/P kidney transplant in July. On pred and now with elevated IOPS and HVF progression OU Make cosopt  OU bid and latanoprost  OU qhs. Add Iopidine  OU bid. Option of RHopressa but cost an issue 2. 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 Age related nuclear cataract ou : Monitor for now.  4. Chronic renal disease (unknown type): being considered for renal [facility] at [facility].  Follow HVF in future, diffuse thinning on OCT with limited utility of following  Pt unsure if still on prednisone  for kidney transplant. LTFU since 05/2017. Medically had to recover from kidney transplant, but pt reports doing better now. HVF/OCT shows no progression. Obtain HVF 10-2 in the future.   Pt taking dor (or cosopt , pt has 2 bottles of  dor) 2/2, iopidine  2/2, and latan 0/1. CPM.   01/19/20 Pt lost to f/u since 01/2019. Counseled on need for compliance.  Missed gtt since yesterday. Restart Cos 2/2, Iop 2/2, and make Lat 1/1  01/21/2020 IOP still high OD  S/p phaco + BVT 350 OD 02/11/2020 (entry into prior trab capsule, so venting slits deferred)   02/12/2020  POD1 s/p phaco/BVT OD Doing well, Ta 9, VA 20/70 Corneal suture removed, moxi pre/post  PF and Cipro  QID OD Iop, Cos, lat OS only   02/19/2020 POW1 Phaco/BVT OD Comfortable, feels well VA 20/250 IOP 5/14. Deep AC, bleb, Seidel negative. No choroidals. Suspect overfiltration in setting of prior trab capsule near tube Continue Cipro  QID (until bottle runs out), Make PF TID  RTC 3 weeks, sooner PRN. Plan for additional PF taper starting next visit.   03/11/20 POM#1 s/p phaco BVT OD.  VA improved 20/40 today.  Stop Cipro  OD Taper PF to BID x 1 week, then QDay x 1 week, then stop RTC 2 weeks around time of tube opening.  If IOP elevated OD at that time, will start Cosopt  for early hypertensive phase.  MRx at that time also.   04/01/20 IOP doing well CPM. No gtt OD. Cont gtt oS  05/14/20 Add-on for intermittent blurry vision OD VA stable, IOP 7/15 Discussed dry eye management Cont iopidine  0/2, cosopt  0/2, latan 0/1 Fellow  only visit  06/14/20 IOP excellent OD borderline OS.  HVF OD with possible changes but IOP excellent. OS with flux Will check B12 and folate levels as pt has possible changes OU despite excelletn IOP OD.   Pt S/P Renal transplant. Kidney damage due to HTN.   RTC 2 m IOP  08/16/20 S/p Phaco/BVT OD 02/11/20 Doing well, no complaints IOP today 24/14 Drops in OD  -ADD cosopt  BID OD and lat OD qhs Drops in OS  -Continue iopidine  BID, cosopt  BID, latan QHS   RTC 6 weeks IOP check, RNFL and MRX OU  10/29/20  S/p phaco/BVT OD 02/11/20 IOP 13/13 on cos 2/2, latan 1/1, iopidine  0/2 OCT RNFL OD mild change since 2019 (since then has  had phaco/BVT), OS stable  MRx dispensed today Fellow only encounter  RTC 3 months HVF 24-2 OU  01/03/21 IOP 14/14 on cos 2/2, latan 1/1, iopidine  0/2 HVF 24-2 appears progressed OU since 06/2020. OD had elevated IOP after tube however OS has had IOP in mid-teens. Schedule 350 BVT OS. Goal circa 10  02/25/21 POD #0 S/P 350 BVT OS.  Doing well PF and cil qid Start cos OS bid Lat OD qhs and cos OD bid  03/03/21 POW1 s/p BVT 350 OS IOP 14/7 Doing well Make cosopt  OD only. Cont latan OD QHS PF taper OS, cipro  until bottle runs out   04/14/21 S/P 350 BVT OS doing well Taper PF to off OS  06/13/21 IOP stable   09/12/2021 IOP 11/23 POM #6 s/p BVT 350 OS Has had shoulder pain since Saturday, stiffness Accidentally used mometasone furoate (ear drop) in OS - now with mild stinging with eye drops. Stinging likely from exposed ripcord. Moxi pre and post and removed. Rip cord exposed OS - removed today with IOP=11. Suspect lower IOP likely temporary as pt already had bleb over capsule.  OCT overall stable OU (some segmentation issues OD, but macular scan stable)  Recommend making cosopt  OU and cont Latan OD Recommend CEIOL OS but may benefit from ECPC vs 2nd tube with CE. Pt reports glare OS stopping her from driving at night  Increase pres-free artificial tears 6x/day OS  10/20/21 IOP stable oU with improved compliance.  Vis signif cat OS Schedule phaco/PCIOL OS. Defer ECPC as IOP excellent when using cos OU bid and lat OD qhs  RTC OR for complex phaco/PCIOL OS.  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 Still with 1+cell Keto/pred TID OS.  02/09/22 VA improving IOP 14, 14 on cos 2/0, latan 1/0 OCT with CME Ran out of PF 2 days ago and ketorolac  1 week ago Restart Keto/pred QID OS for 1 month Make Cosopt  BID OU Latan qhs OD only  03/13/22 IOP stable OCT OS with tr CME Can taper PF and decr acular   06/15/22 IOP 17/05 No CME OS IOP borderline OD.Coming off of Pred Forte  Will see how IOP OD does Iopidine  hard to get  currently and Rhopressa cost prohibitive If IOP still elevated on return  then cna do SLT same day  07/05/2022 Urgent visit for redness, pain and blurry vision OS IOP 0 with mild AC cell/flare and K edema OCT showing macular edema No leaks, no signs of infection Likely inflammation with CB shutdown Stop Cosopt  OS Start PF q2h, ketorolac  QID OS  07/31/22 IOP OD above goal OS improving Inflammation better  OD IOP may be due to steroids Will decr PF to 6x/d OS x1 wk then 5x/d x 1 wk then qid until seen  08/21/22 IOP stable Pt decr PF to qd on own USe PF and Ketorlac bid OS Cont Cos and Lat OD  09/07/2022 OCT Mac OS appears compact IOP 15/15 on cosopt  2/0, latan 1/0, PF 0/2, ketorolac  0/2.  Lower PF/keto to Qday. Stop in 4 weeks.  10/30/22 IOP above goal OS.  Make COs Ou  11/16/22 urgent visit w/ Dr. Genna for tearing OS and blurred vision - OCT mac with CME OS, and low IOP of 3. No evidence of leak. She just stopped her PF and Ketorolac  2 weeks ago. (Was on it for CME OS, and an episode of suspected CB shut down due to inflammation).  - Per prior Dr. Selestine notes, suspect that she gets CB shutdown with inflammation. - Stop IOP drops OS (continue OD), restart PF QID and Ketorolac  QID - Will message Dr. Bethany to determine f/u.  - K edema likely secondary to low IOP  12/14/22 Taking PF 0/4, ketorolac  0/4 Taking Cosopt  2/0, Latan 1/0 HVF relatively stable OU from 4/22. Possible change  OD IOP 18 OD too high, possibly from PF use OS? IOP 8 OS, good VA improved from last visit, K edema resolved Denies any recent irritation but reports vision waxes and wanes OU Add Iopidine  Od bid  Plan: Start tapering PF 3-2-1 at two week intervals RTC in 6-8 weeks for IOP check. And OCT OU  01/08/2023 IOP 15/15 on cos 2/0, lat 1/0, iopidine  2/0 Still taking PF and ketorolac  qid OS Will taper PF/ketorolac  3-2-1 at two week intervals Make cosopt  BID OU  02/19/23 IOP stable D/C PF and acular  qd OS DFE with BDR OS nl OD Rec good BS control and f/u with internist  05/10/2023 IOP 9/3 on cos2/2, iop 2/0, xal 1/0, acu 0/2 Patient objectively testing the same for Va OS but notices it subjectively being a little more blurry Taper of acular  qday x 1 week then stop OS (Oct mac without edema 01/08/23) Stop Cosopt  OS (given pt symptoms and low iop) Cont Iop2/cos2/xal1 OD   RTC 4-6 weeks, iop check, Oct mac, Oct RNFL   07/02/23 Reports decreased vision OS VA: 20/150(down from 20/30) IOP 9 and 0 Seidel neg  OCT with increased thickness OS  No choroidals on DFE or B-scan. 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 Unusual to be so low. No RD Tube seidel neg. Hyposecretion can occur due to inflammation Start atropine BID  Start PF q 2 to 3 H WA OS   07/09/23 IOP 08/02. No RD,etc seen Cont PF q 2 H WA and atropien OS bid RTC 1 wk if no better then get B scan OS and consider Tube tie off  07/16/23 20/70(improving) IOP 10/3, stable  Exam with deep AC, ST well positioned tube  Continue to suspect hyposecretion likely inflammatory etiology, no choroidals On Atropine BID OS Prednisolone  q2hWA OS Apraclonidine   BID OD Cosopt  BID OD Latanoprost  qhs OD CPM   08/02/2023 Continued slowly improving vision and IOP Still with flare but no cell Already using PF  QID per patient (rather than Q2H) Continue PF 0/4 until next visit Trial off atropine (restart if photophobia) Continue apra 2/0, cos 2/0, xal 1/0  11/12/2023 IOP 19/16 on apra 2/0, xal 1/0, PF 0/4 AC OS quiet Restart cosopt  2/2- not using since 10/24 CME resolved, taper pred forte  off qweek OS  03/03/24 IOP 18/06 Recently had back surgery an then in rehab. States she got drops but unsure which ones.  IOP too high OD. On MTMT Rec IN tube OD.   04/17/24 POD #1 S/p IN 350 BVT OD IOP 05/08 Doing well. Wounds intact Std precautions and gtt taper.  Hold lat and Apraclonidine  OD  RTC 1 wk  Brim- allergy CME OS DFE 02/19/23  The Chief Complaint, HPI, ROS and PFSHx as documented were reviewed and I agree as documented, or /vised as indicated

## 2024-04-22 ENCOUNTER — Ambulatory Visit: Admitting: Physician Assistant

## 2024-04-22 ENCOUNTER — Encounter: Payer: Self-pay | Admitting: Physician Assistant

## 2024-04-22 VITALS — BP 126/72 | Ht 63.0 in | Wt 128.0 lb

## 2024-04-22 DIAGNOSIS — Z981 Arthrodesis status: Secondary | ICD-10-CM | POA: Diagnosis not present

## 2024-04-22 DIAGNOSIS — R2681 Unsteadiness on feet: Secondary | ICD-10-CM

## 2024-04-22 DIAGNOSIS — M62838 Other muscle spasm: Secondary | ICD-10-CM | POA: Diagnosis not present

## 2024-04-22 DIAGNOSIS — M549 Dorsalgia, unspecified: Secondary | ICD-10-CM | POA: Diagnosis not present

## 2024-04-22 MED ORDER — METHOCARBAMOL 500 MG PO TABS: 500.0000 mg | ORAL_TABLET | Freq: Four times a day (QID) | ORAL | Status: DC | PRN

## 2024-04-22 NOTE — Progress Notes (Addendum)
   REFERRING PHYSICIAN:  Rilla Baller, Md [address] [place],  [place] [address]  DOS: 01/03/24 MIS TLIF at L4-S1, left approach   HISTORY OF PRESENT ILLNESS: Kristen Kirk is approximately 4 months weeks status post MIS TLIF at L4 S1.  She continues to have some residual back pain and left lower extremity pain.  Unfortunately she was unable to go to physical therapy like we discussed at her last appointment.  She also has been complaining of some muscle spasms in her low back and down her left leg.  No new weakness numbness or tingling.   PHYSICAL EXAMINATION:  General: Patient is well developed, well nourished, calm, collected, and in no apparent distress.   NEUROLOGICAL:  General: In no acute distress.   Awake, alert, oriented to person, place, and time.  Pupils equal round and reactive to light.  Facial tone is symmetric.    Strength:            Side Iliopsoas Quads Hamstring PF DF EHL  R 5 5 5 5 5 5   L 5 5 5 5 5 5    Incision c/d/i   ROS (Neurologic):  Negative except as noted above  IMAGING:   EXAM: LUMBAR SPINE - 2-3 VIEW   COMPARISON:  02/13/2024   FINDINGS: Stable posterior fusion at L4-5 and L5-S1. Inter body disc spacers again noted at both levels. Stable alignment and hardware. Stable lower thoracic and lumbar degenerative changes above the hardware spanning T11-L3. Preserved vertebral body heights. No acute osseous finding or fracture. Sacral stimulator again noted. Aorta atherosclerotic. Bones are osteopenic.   IMPRESSION: Stable postoperative and degenerative changes as above. No acute finding by plain radiography.     Electronically Signed   By: CHRISTELLA.  Shick M.D.   On: 03/31/2024 20:05   ASSESSMENT/PLAN:  Kristen Kirk is approximately 4 months weeks status post MIS TLIF at L4 S1.  She continues to have some residual back pain and left lower extremity pain.  Unfortunately she was unable to go to physical therapy like we discussed at  her last appointment.  She also has been complaining of some muscle spasms in her low back and down her left leg.  No new weakness numbness or tingling.  Examination remained stable.  Plan to see patient in clinic today.  We decided to restart her muscle relaxer due to her intense muscle spasms.  I have replaced a referral for physical therapy and emphasized the importance of this to the patient.  I do believe will help her with her pain and gait instability.  Advised patient to contact us  for any questions or concerns that she has in the future.  She may follow-up as needed.  Lyle Decamp, PA-C Department of neurosurgery

## 2024-04-28 ENCOUNTER — Telehealth: Payer: Self-pay | Admitting: Physician Assistant

## 2024-04-28 ENCOUNTER — Other Ambulatory Visit: Payer: Self-pay | Admitting: Physician Assistant

## 2024-04-28 MED ORDER — METHOCARBAMOL 500 MG PO TABS: 500.0000 mg | ORAL_TABLET | Freq: Four times a day (QID) | ORAL | Status: DC | PRN

## 2024-04-28 NOTE — Telephone Encounter (Signed)
 Patient notified and expressed understanding.

## 2024-04-28 NOTE — Telephone Encounter (Signed)
 Patient called to let our office know that [facility] on [address] did not receive the refill of Methocarbamol . Can this be resent?

## 2024-04-29 ENCOUNTER — Other Ambulatory Visit: Payer: Self-pay | Admitting: Physician Assistant

## 2024-04-29 MED ORDER — METHOCARBAMOL 500 MG PO TABS: 500.0000 mg | ORAL_TABLET | Freq: Three times a day (TID) | ORAL | 2 refills | Status: DC | PRN

## 2024-05-06 ENCOUNTER — Telehealth: Payer: Self-pay | Admitting: Physician Assistant

## 2024-05-06 NOTE — Telephone Encounter (Signed)
 Patient is calling because her muscle relaxer is not working and she would like a call back.  She states that she could not go to PT today because she can hardly walk due to the muscle spasms in her legs.

## 2024-05-06 NOTE — Telephone Encounter (Signed)
 LOV 04/22/2024. Patient has been taking Methocarbamol -taking 1 tablet every 8 hours every day since last visit. Muscle spasms/pain is the same as it was when she was last seen. Would like guidance on what else she can take or try at this time.

## 2024-05-06 NOTE — Telephone Encounter (Signed)
 Unable to leave message, Voicemail has not been set up.

## 2024-05-08 NOTE — Telephone Encounter (Signed)
 Patient left a voicemail with the answering service that she was returning a call.

## 2024-05-08 NOTE — Telephone Encounter (Signed)
Attempted to call again, voicemail not set up.

## 2024-05-12 NOTE — Telephone Encounter (Signed)
 Spoke to patient and she would like to try Baclofen.

## 2024-05-12 NOTE — Telephone Encounter (Signed)
 Patient is returning call. She states she will be home all day and should be able to answer her phone.

## 2024-05-14 ENCOUNTER — Encounter: Payer: Self-pay | Admitting: Neurosurgery

## 2024-05-14 ENCOUNTER — Ambulatory Visit: Admitting: Neurosurgery

## 2024-05-14 ENCOUNTER — Ambulatory Visit
Admission: RE | Admit: 2024-05-14 | Discharge: 2024-05-14 | Disposition: A | Discharge status: Home / Self Care | Attending: Neurosurgery | Admitting: Neurosurgery

## 2024-05-14 ENCOUNTER — Ambulatory Visit
Admission: RE | Admit: 2024-05-14 | Discharge: 2024-05-14 | Disposition: A | Discharge status: Home / Self Care | Source: Ambulatory Visit | Attending: Neurosurgery | Admitting: Neurosurgery

## 2024-05-14 VITALS — BP 124/76 | Ht 63.0 in | Wt 128.0 lb

## 2024-05-14 DIAGNOSIS — R252 Cramp and spasm: Secondary | ICD-10-CM

## 2024-05-14 DIAGNOSIS — Z981 Arthrodesis status: Secondary | ICD-10-CM | POA: Insufficient documentation

## 2024-05-14 DIAGNOSIS — M5126 Other intervertebral disc displacement, lumbar region: Secondary | ICD-10-CM | POA: Diagnosis not present

## 2024-05-14 DIAGNOSIS — M47816 Spondylosis without myelopathy or radiculopathy, lumbar region: Secondary | ICD-10-CM | POA: Diagnosis not present

## 2024-05-14 DIAGNOSIS — Z09 Encounter for follow-up examination after completed treatment for conditions other than malignant neoplasm: Secondary | ICD-10-CM

## 2024-05-14 DIAGNOSIS — M48061 Spinal stenosis, lumbar region without neurogenic claudication: Secondary | ICD-10-CM | POA: Diagnosis not present

## 2024-05-14 MED ORDER — TIZANIDINE HCL 4 MG PO TABS: 4.0000 mg | ORAL_TABLET | Freq: Three times a day (TID) | ORAL | 1 refills | Status: DC | PRN

## 2024-05-14 NOTE — Progress Notes (Signed)
   REFERRING PHYSICIAN:  Rilla Baller, Md [address] [place],  [place] [address]  DOS: 01/03/24 MIS TLIF at L4-S1, left approach   HISTORY OF PRESENT ILLNESS: Kristen Kirk is approximately 4.5 months weeks status post MIS TLIF at L4 S1.  She has not been able to undergo physical therapy yet.  She has not been tolerating her muscle relaxant would like to try something new.  She has not yet had recent x-rays.   PHYSICAL EXAMINATION:  General: Patient is well developed, well nourished, calm, collected, and in no apparent distress.   NEUROLOGICAL:  General: In no acute distress.   Awake, alert, oriented to person, place, and time.  Pupils equal round and reactive to light.  Facial tone is symmetric.    Strength:            Side Iliopsoas Quads Hamstring PF DF EHL  R 5 5 5 5 5 5   L 5 5 5 5 5 5    Incision c/d/i   ROS (Neurologic):  Negative except as noted above  IMAGING:  X-rays pending  ASSESSMENT/PLAN:  Kristen Kirk is approximately 4.5  months status post MIS TLIF at L4-S1.  She is having intermittent buttocks region spasms.  Like to get repeat x-rays.  She is also not tolerating her Robaxin  at this time, we will plan on transitioning her to alternative medication. Will follow up after xrays.   [name] Sharps, MD Department of neurosurgery

## 2024-05-15 DIAGNOSIS — Z20822 Contact with and (suspected) exposure to covid-19: Secondary | ICD-10-CM | POA: Diagnosis not present

## 2024-05-29 DIAGNOSIS — Z940 Kidney transplant status: Secondary | ICD-10-CM | POA: Diagnosis not present

## 2024-05-30 ENCOUNTER — Ambulatory Visit: Payer: Self-pay | Admitting: Neurosurgery

## 2024-06-24 DIAGNOSIS — K080 Exfoliation of teeth due to systemic causes: Secondary | ICD-10-CM | POA: Diagnosis not present

## 2024-06-25 DIAGNOSIS — M545 Low back pain, unspecified: Secondary | ICD-10-CM | POA: Diagnosis not present

## 2024-06-25 DIAGNOSIS — M79669 Pain in unspecified lower leg: Secondary | ICD-10-CM | POA: Diagnosis not present

## 2024-06-30 DIAGNOSIS — M545 Low back pain, unspecified: Secondary | ICD-10-CM | POA: Diagnosis not present

## 2024-06-30 DIAGNOSIS — M79669 Pain in unspecified lower leg: Secondary | ICD-10-CM | POA: Diagnosis not present

## 2024-07-02 DIAGNOSIS — M545 Low back pain, unspecified: Secondary | ICD-10-CM | POA: Diagnosis not present

## 2024-07-02 DIAGNOSIS — M79669 Pain in unspecified lower leg: Secondary | ICD-10-CM | POA: Diagnosis not present

## 2024-07-07 DIAGNOSIS — M545 Low back pain, unspecified: Secondary | ICD-10-CM | POA: Diagnosis not present

## 2024-07-07 DIAGNOSIS — M79669 Pain in unspecified lower leg: Secondary | ICD-10-CM | POA: Diagnosis not present

## 2024-07-10 DIAGNOSIS — E113292 Type 2 diabetes mellitus with mild nonproliferative diabetic retinopathy without macular edema, left eye: Secondary | ICD-10-CM | POA: Diagnosis not present

## 2024-07-10 DIAGNOSIS — H401123 Primary open-angle glaucoma, left eye, severe stage: Secondary | ICD-10-CM | POA: Diagnosis not present

## 2024-07-10 DIAGNOSIS — H401133 Primary open-angle glaucoma, bilateral, severe stage: Secondary | ICD-10-CM | POA: Diagnosis not present

## 2024-07-10 DIAGNOSIS — Z961 Presence of intraocular lens: Secondary | ICD-10-CM | POA: Diagnosis not present

## 2024-07-10 LAB — HM DIABETES EYE EXAM

## 2024-07-11 DIAGNOSIS — M79669 Pain in unspecified lower leg: Secondary | ICD-10-CM | POA: Diagnosis not present

## 2024-07-11 DIAGNOSIS — M545 Low back pain, unspecified: Secondary | ICD-10-CM | POA: Diagnosis not present

## 2024-07-16 DIAGNOSIS — M79669 Pain in unspecified lower leg: Secondary | ICD-10-CM | POA: Diagnosis not present

## 2024-07-16 DIAGNOSIS — M545 Low back pain, unspecified: Secondary | ICD-10-CM | POA: Diagnosis not present

## 2024-07-21 DIAGNOSIS — M545 Low back pain, unspecified: Secondary | ICD-10-CM | POA: Diagnosis not present

## 2024-07-21 DIAGNOSIS — M79669 Pain in unspecified lower leg: Secondary | ICD-10-CM | POA: Diagnosis not present

## 2024-07-24 ENCOUNTER — Telehealth: Payer: Self-pay | Admitting: Physician Assistant

## 2024-07-24 ENCOUNTER — Other Ambulatory Visit: Payer: Self-pay | Admitting: Physician Assistant

## 2024-07-24 MED ORDER — CYCLOBENZAPRINE HCL 5 MG PO TABS: 5.0000 mg | ORAL_TABLET | Freq: Three times a day (TID) | ORAL | 0 refills | Status: AC | PRN

## 2024-07-24 NOTE — Telephone Encounter (Signed)
 Patient notified and expressed understanding.

## 2024-07-24 NOTE — Telephone Encounter (Signed)
 Patient is calling to see if there is anything else she can take for muscle spasms. She states that the current medication is not helping her at all. She states that the physical therapy is helping her with her pain but not the spasms. Please advise.  [facility] [address]

## 2024-07-25 DIAGNOSIS — M79669 Pain in unspecified lower leg: Secondary | ICD-10-CM | POA: Diagnosis not present

## 2024-07-25 DIAGNOSIS — M545 Low back pain, unspecified: Secondary | ICD-10-CM | POA: Diagnosis not present

## 2024-07-28 DIAGNOSIS — M79669 Pain in unspecified lower leg: Secondary | ICD-10-CM | POA: Diagnosis not present

## 2024-07-30 ENCOUNTER — Telehealth: Payer: Self-pay | Admitting: Physician Assistant

## 2024-07-30 DIAGNOSIS — Z940 Kidney transplant status: Secondary | ICD-10-CM | POA: Diagnosis not present

## 2024-07-30 NOTE — Telephone Encounter (Signed)
 Pt is stating that the muscle spasms have not gone away with the new flexeril  rx. Pt says that PT works for a short amount of time & then when they get home it no longer works. She is wondering her other options.

## 2024-07-30 NOTE — Telephone Encounter (Signed)
 I spoke with the patient. She states sometimes it is bad and sometimes it is not. The pain is still in her buttock and the back of her left thigh (and sometimes the right) and when she first gets up in the morning, she is bent over walking with the cane for a bit before she can stand up straight. The pain feels sharp. She takes tylenol  maybe every other day or when she is out somewhere because she doesn't take her prescriptions out with her. She doesn't feel like the flexeril  has helped at all.   [facility] [address]

## 2024-07-31 ENCOUNTER — Other Ambulatory Visit: Payer: Self-pay | Admitting: Physician Assistant

## 2024-07-31 MED ORDER — BACLOFEN 10 MG PO TABS: 10.0000 mg | ORAL_TABLET | Freq: Three times a day (TID) | ORAL | 0 refills | Status: DC

## 2024-07-31 NOTE — Telephone Encounter (Signed)
 I notified the patient that [facility] sent in baclofen. I advised her to stop flexeril  and try the baclofen instead.

## 2024-08-06 DIAGNOSIS — M79669 Pain in unspecified lower leg: Secondary | ICD-10-CM | POA: Diagnosis not present

## 2024-08-08 DIAGNOSIS — M79669 Pain in unspecified lower leg: Secondary | ICD-10-CM | POA: Diagnosis not present

## 2024-08-13 ENCOUNTER — Telehealth: Payer: Self-pay | Admitting: Physician Assistant

## 2024-08-13 NOTE — Telephone Encounter (Signed)
 Pt is hurting more than she was after PT, wanting to know her next steps & to speak with brooke about this.

## 2024-08-13 NOTE — Telephone Encounter (Signed)
 Please see the message below.

## 2024-08-13 NOTE — Telephone Encounter (Signed)
 Would you like the patient to be seen for a follow up? Should it be a PA or MD?

## 2024-08-16 ENCOUNTER — Other Ambulatory Visit: Payer: Self-pay | Admitting: Family Medicine

## 2024-08-16 DIAGNOSIS — E559 Vitamin D deficiency, unspecified: Secondary | ICD-10-CM

## 2024-08-16 DIAGNOSIS — Z940 Kidney transplant status: Secondary | ICD-10-CM

## 2024-08-16 DIAGNOSIS — E1169 Type 2 diabetes mellitus with other specified complication: Secondary | ICD-10-CM

## 2024-08-18 DIAGNOSIS — H7011 Chronic mastoiditis, right ear: Secondary | ICD-10-CM | POA: Diagnosis not present

## 2024-08-18 DIAGNOSIS — R42 Dizziness and giddiness: Secondary | ICD-10-CM | POA: Diagnosis not present

## 2024-08-18 DIAGNOSIS — H90A21 Sensorineural hearing loss, unilateral, right ear, with restricted hearing on the contralateral side: Secondary | ICD-10-CM | POA: Diagnosis not present

## 2024-08-19 ENCOUNTER — Ambulatory Visit: Payer: Self-pay | Admitting: Family Medicine

## 2024-08-19 ENCOUNTER — Other Ambulatory Visit

## 2024-08-19 DIAGNOSIS — E1169 Type 2 diabetes mellitus with other specified complication: Secondary | ICD-10-CM

## 2024-08-19 DIAGNOSIS — E785 Hyperlipidemia, unspecified: Secondary | ICD-10-CM

## 2024-08-19 DIAGNOSIS — E559 Vitamin D deficiency, unspecified: Secondary | ICD-10-CM

## 2024-08-19 DIAGNOSIS — Z940 Kidney transplant status: Secondary | ICD-10-CM | POA: Diagnosis not present

## 2024-08-19 LAB — LIPID PANEL
Cholesterol: 147 mg/dL (ref 0–200)
HDL: 45.9 mg/dL (ref >39.00)
LDL Cholesterol: 83 mg/dL (ref 0–99)
NonHDL: 101.02
Total CHOL/HDL Ratio: 3
Triglycerides: 91 mg/dL (ref 0.0–149.0)
VLDL: 18.2 mg/dL (ref 0.0–40.0)

## 2024-08-19 LAB — MICROALBUMIN / CREATININE URINE RATIO
Creatinine,U: 91.1 mg/dL
Microalb Creat Ratio: 189.5 mg/g — ABNORMAL HIGH (ref 0.0–30.0)
Microalb, Ur: 17.3 mg/dL — ABNORMAL HIGH (ref 0.0–1.9)

## 2024-08-19 LAB — COMPREHENSIVE METABOLIC PANEL WITH GFR
ALT: 12 U/L (ref 0–35)
AST: 14 U/L (ref 0–37)
Albumin: 4.5 g/dL (ref 3.5–5.2)
Alkaline Phosphatase: 68 U/L (ref 39–117)
BUN: 33 mg/dL — ABNORMAL HIGH (ref 6–23)
CO2: 25 meq/L (ref 19–32)
Calcium: 10 mg/dL (ref 8.4–10.5)
Chloride: 104 meq/L (ref 96–112)
Creatinine, Ser: 1 mg/dL (ref 0.40–1.20)
GFR: 56.49 mL/min — ABNORMAL LOW (ref >60.00)
Glucose, Bld: 124 mg/dL — ABNORMAL HIGH (ref 70–99)
Potassium: 4 meq/L (ref 3.5–5.1)
Sodium: 140 meq/L (ref 135–145)
Total Bilirubin: 0.5 mg/dL (ref 0.2–1.2)
Total Protein: 7.2 g/dL (ref 6.0–8.3)

## 2024-08-19 LAB — CBC WITH DIFFERENTIAL/PLATELET
Basophils Absolute: 0 K/uL (ref 0.0–0.1)
Basophils Relative: 0.3 % (ref 0.0–3.0)
Eosinophils Absolute: 0 K/uL (ref 0.0–0.7)
Eosinophils Relative: 0.3 % (ref 0.0–5.0)
HCT: 39.5 % (ref 36.0–46.0)
Hemoglobin: 12.7 g/dL (ref 12.0–15.0)
Lymphocytes Relative: 15.3 % (ref 12.0–46.0)
Lymphs Abs: 1 K/uL (ref 0.7–4.0)
MCHC: 32.2 g/dL (ref 30.0–36.0)
MCV: 82.8 fl (ref 78.0–100.0)
Monocytes Absolute: 0.6 K/uL (ref 0.1–1.0)
Monocytes Relative: 9.4 % (ref 3.0–12.0)
Neutro Abs: 5.1 K/uL (ref 1.4–7.7)
Neutrophils Relative %: 74.7 % (ref 43.0–77.0)
Platelets: 233 K/uL (ref 150.0–400.0)
RBC: 4.78 Mil/uL (ref 3.87–5.11)
RDW: 14.8 % (ref 11.5–15.5)
WBC: 6.8 K/uL (ref 4.0–10.5)

## 2024-08-19 LAB — VITAMIN D 25 HYDROXY (VIT D DEFICIENCY, FRACTURES): VITD: 43.92 ng/mL (ref 30.00–100.00)

## 2024-08-19 LAB — HEMOGLOBIN A1C: Hgb A1c MFr Bld: 6.9 % — ABNORMAL HIGH (ref 4.6–6.5)

## 2024-08-19 LAB — VITAMIN B12: Vitamin B-12: 446 pg/mL (ref 211–911)

## 2024-08-27 ENCOUNTER — Encounter: Admitting: Family Medicine

## 2024-08-27 NOTE — Telephone Encounter (Signed)
 Patient missed her CPE appt today which is unusual for her Can we call and offer to reschedule?  She already had labs done last week.

## 2024-09-01 ENCOUNTER — Encounter: Payer: Self-pay | Admitting: Family Medicine

## 2024-09-01 ENCOUNTER — Telehealth: Payer: Self-pay | Admitting: Family Medicine

## 2024-09-01 ENCOUNTER — Telehealth: Payer: Self-pay | Admitting: Neurosurgery

## 2024-09-01 ENCOUNTER — Ambulatory Visit: Admitting: Family Medicine

## 2024-09-01 VITALS — BP 138/72 | HR 65 | Temp 98.1°F | Ht 63.0 in | Wt 130.8 lb

## 2024-09-01 DIAGNOSIS — D849 Immunodeficiency, unspecified: Secondary | ICD-10-CM

## 2024-09-01 DIAGNOSIS — Z23 Encounter for immunization: Secondary | ICD-10-CM

## 2024-09-01 DIAGNOSIS — R152 Fecal urgency: Secondary | ICD-10-CM

## 2024-09-01 DIAGNOSIS — Z940 Kidney transplant status: Secondary | ICD-10-CM | POA: Diagnosis not present

## 2024-09-01 DIAGNOSIS — Z7189 Other specified counseling: Secondary | ICD-10-CM

## 2024-09-01 DIAGNOSIS — E1129 Type 2 diabetes mellitus with other diabetic kidney complication: Secondary | ICD-10-CM

## 2024-09-01 DIAGNOSIS — E785 Hyperlipidemia, unspecified: Secondary | ICD-10-CM

## 2024-09-01 DIAGNOSIS — H401133 Primary open-angle glaucoma, bilateral, severe stage: Secondary | ICD-10-CM

## 2024-09-01 DIAGNOSIS — G8929 Other chronic pain: Secondary | ICD-10-CM

## 2024-09-01 DIAGNOSIS — K219 Gastro-esophageal reflux disease without esophagitis: Secondary | ICD-10-CM

## 2024-09-01 DIAGNOSIS — N186 End stage renal disease: Secondary | ICD-10-CM | POA: Diagnosis not present

## 2024-09-01 DIAGNOSIS — E1169 Type 2 diabetes mellitus with other specified complication: Secondary | ICD-10-CM

## 2024-09-01 DIAGNOSIS — Z0000 Encounter for general adult medical examination without abnormal findings: Secondary | ICD-10-CM | POA: Diagnosis not present

## 2024-09-01 DIAGNOSIS — N3946 Mixed incontinence: Secondary | ICD-10-CM

## 2024-09-01 DIAGNOSIS — M5442 Lumbago with sciatica, left side: Secondary | ICD-10-CM | POA: Diagnosis not present

## 2024-09-01 DIAGNOSIS — G3184 Mild cognitive impairment, so stated: Secondary | ICD-10-CM

## 2024-09-01 DIAGNOSIS — E559 Vitamin D deficiency, unspecified: Secondary | ICD-10-CM

## 2024-09-01 DIAGNOSIS — I10 Essential (primary) hypertension: Secondary | ICD-10-CM

## 2024-09-01 DIAGNOSIS — M818 Other osteoporosis without current pathological fracture: Secondary | ICD-10-CM

## 2024-09-01 NOTE — Assessment & Plan Note (Signed)
 Advanced directive - does not have this yet. Packet previously provided. HCPOA would be husband and sister. Full code, would be ok with feeding tube. Would not want prolonged life support if terminal condition.

## 2024-09-01 NOTE — Assessment & Plan Note (Signed)
 Continues cellcept , prograf  through nephrology.

## 2024-09-01 NOTE — Progress Notes (Signed)
 Ph: [phone] Fax: [phone]   Patient ID: Kristen Kirk, female    DOB: Feb 04, 1951, 73 y.o.   MRN: [id number]  This visit was conducted in person.  BP 138/72   Pulse 65   Temp 98.1 F (36.7 C) (Oral)   Ht 5' 3 (1.6 m)   Wt 130 lb 12.8 oz (59.3 kg)   LMP  (LMP Unknown)   SpO2 97%   BMI 23.17 kg/m    CC: CPE/AMW Subjective:   HPI: Kristen Kirk is a 73 y.o. female presenting on 09/01/2024 for No chief complaint on file.   Did not see health advisor this year.   No results found.  Flowsheet Row Office Visit from 09/01/2024 in [facility]  PHQ-2 Total Score 0       09/01/2024    2:28 PM 03/26/2024    9:12 AM 07/31/2023    2:46 PM 05/15/2023    9:27 AM 03/21/2023    3:06 PM  Fall Risk   Falls in the past year? 0 0 0 0 0  Number falls in past yr: 0  0    Injury with Fall? 0   0     Risk for fall due to : No Fall Risks  No Fall Risks    Follow up Falls evaluation completed  Education provided;Falls prevention discussed       Data saved with a previous flowsheet row definition   Severe bilateral POAG followed by [facility] eye doctor Dr Bethany. Has had eye surgery.   Kidney transplant 04/2017 continues prograf  and mycophenolate . Goal BP <130/80. Regularly sees nephrologist Dr Leobardo.    Fecal incontinence - saw gen surgery Dr Debby - anal manometry showed decreased int sphincter function. Now taking fiber supplement. S/p sacral nerve stimulator placement 2023. Last saw gen surg 08/07/2023. Notes ongoing fecal incontinence.   Sacral nerve stimulator limits MRI use.   Sees [facility]R Chasnis and Whitney NP as well as neurosurgery Dr Claudene s/p MIS TLIF (minimally invasive transforaminal lumbar interbody fusion) L4/S1 12/2023. She completed physical therapy. Having significant ongoing pain - points to L groin, worse when walking but even present when supine. Notes dependence on cane to walk.    She is on fosamax  since 2021. She stopped  Namenda  due to dry mouth   Preventative: COLONOSCOPY 03/2019 - diverticulosis, rpt 10 yrs (Nandigam)  Pap normal 07/2011, 08/2014 WNL. Pap 03/2019 - ASCUS, HPV neg, normal again 08/2023 - aged out.  Mammogram 10/2023 Birads1 @ [place]  DEXA 04/2019 - 1.5 spine, -2.1 hip, -2.5 forearm - osteoporosis  DEXA 07/2022 - T -2.6 R forearm - continue weekly fosamax  started by renal 2021. Regular walking, vitamin D  2000IU daily, dietary calcium intake.  Lung cancer screen - quit smoking ~1987 Flu shot yearly.  COVD vaccine Pfizer 11/2019, 12/2019, booster 06/2020  Tetanus 2009, Tdap 08/2023.  RSV 06/2022, 08/2023 Pneumovax 2013. Prevnar-13 2020 Shingrix - 07/2018, 10/2020, 08/2023 Advanced directive - does not have this yet. Packet provided today. HCPOA would be husband and sister. Full code, would be ok with feeding tube. Would not want prolonged life support if terminal condition.  Seat belt use discussed  Sunscreen use discussed. No changing moles on skin  Non smoker Alcohol  - none Dentist q6 mo  Eye exam yearly - known severe bilateral POAG sees Dr Bethany at [facility].  Bowel - ongoing incontinence issue as per above. Wears pad regularly - overall better after sacral nerve stimulator placement  Bladder - chronic urge incontinence previously followed by uro s/p PTNS - now off myrbetriq, worse symptoms at night time. Has tried multiple other treatments. Wears pad regularly (MacDiarmid).   Lives with husband (on disability) Grown children - daughter in [place] Occupation: engineer, mining at [employer], 3rd shift Activity: walking regularly - twice weekly  Diet: good water , fruits/vegetables daily      Relevant past medical, surgical, family and social history reviewed and updated as indicated. Interim medical history since our last visit reviewed. Allergies and medications reviewed and updated. Outpatient Medications Prior to Visit  Medication Sig Dispense Refill   acetaminophen  (TYLENOL ) 500 MG tablet  Take 500 mg by mouth 3 (three) times daily as needed for moderate pain (pain score 4-6) or mild pain (pain score 1-3).     alendronate  (FOSAMAX ) 70 MG tablet Take 1 tablet (70 mg total) by mouth every 7 (seven) days. Take with a full glass of water  on an empty stomach. 12 tablet 4   amLODipine  (NORVASC ) 2.5 MG tablet Take 2.5 mg by mouth daily.     apraclonidine  (IOPIDINE ) 0.5 % ophthalmic solution Place 1 drop into the right eye in the morning and at bedtime.     aspirin  EC 81 MG tablet Take 81 mg by mouth daily.     atropine 1 % ophthalmic solution Place 1 drop into the right eye 2 (two) times daily.     baclofen  (LIORESAL ) 10 MG tablet Take 1 tablet (10 mg total) by mouth 3 (three) times daily. 30 each 0   Blood Glucose Monitoring Suppl (ONE TOUCH ULTRA SYSTEM KIT) W/DEVICE KIT 1 kit by Does not apply route once. 1 each 0   Carboxymethylcellulose Sodium 0.25 % SOLN Place 1 drop into both eyes as needed.     carvedilol  (COREG ) 25 MG tablet Take 12.5 mg by mouth 2 (two) times daily with a meal.     Cholecalciferol  (VITAMIN D ) 50 MCG (2000 UT) CAPS Take 2,000 Units by mouth daily.     ciprofloxacin  (CILOXAN ) 0.3 % ophthalmic solution Place 1 drop into the right eye 4 (four) times daily.     cyclobenzaprine  (FLEXERIL ) 5 MG tablet Take 1 tablet (5 mg total) by mouth 3 (three) times daily as needed for muscle spasms. 30 tablet 0   docusate sodium  (COLACE) 100 MG capsule Take 100 mg by mouth 2 (two) times daily as needed for mild constipation or moderate constipation.  0   dorzolamide -timolol  (COSOPT ) 2-0.5 % ophthalmic solution Place 1 drop into both eyes 2 (two) times daily.     glucose blood test strip Check sugars once daily 100 each 11   hydrochlorothiazide  (HYDRODIURIL ) 12.5 MG tablet Take 1 tablet (12.5 mg total) by mouth daily. 90 tablet 4   latanoprost  (XALATAN ) 0.005 % ophthalmic solution Place 1 drop into the right eye at bedtime.     Melatonin 3 MG CAPS Take 1-2 capsules (3-6 mg total) by  mouth at bedtime as needed (sleep).  0   Multiple Vitamin (MULTIVITAMIN) tablet Take 1 tablet by mouth daily.     mycophenolate  (CELLCEPT ) 500 MG tablet Take 500 mg by mouth 2 (two) times daily.     pantoprazole  (PROTONIX ) 20 MG tablet Take 1 tablet (20 mg total) by mouth daily. 90 tablet 4   pravastatin  (PRAVACHOL ) 80 MG tablet Take 1 tablet (80 mg total) by mouth every evening. 90 tablet 4   prednisoLONE  acetate (PRED FORTE ) 1 % ophthalmic suspension Place 1 drop into the left  eye in the morning, at noon, and at bedtime.     promethazine -dextromethorphan (PROMETHAZINE -DM) 6.25-15 MG/5ML syrup Take 2.5-5 mLs by mouth 2 (two) times daily as needed for cough (sedation precautions). 118 mL 0   senna (SENOKOT) 8.6 MG TABS tablet Take 1 tablet (8.6 mg total) by mouth 2 (two) times daily.     tacrolimus  (PROGRAF ) 0.5 MG capsule Take 0.5-1 mg by mouth See admin instructions. Take 2 capsules (1mg ) by mouth every morning and take 1 capsule (0.5mg ) by mouth every night     traZODone  (DESYREL ) 50 MG tablet Take 0.5-1 tablets (25-50 mg total) by mouth at bedtime as needed for sleep. 30 tablet 3   doxycycline  (VIBRA -TABS) 100 MG tablet Take 1 tablet (100 mg total) by mouth 2 (two) times daily. 20 tablet 0   No facility-administered medications prior to visit.     Per HPI unless specifically indicated in ROS section below Review of Systems  Constitutional:  Negative for activity change, appetite change, chills, fatigue, fever and unexpected weight change.  HENT:  Negative for hearing loss.   Eyes:  Negative for visual disturbance.  Respiratory:  Negative for cough, chest tightness, shortness of breath and wheezing.   Cardiovascular:  Negative for chest pain, palpitations and leg swelling.  Gastrointestinal:  Positive for constipation (occ) and diarrhea (occ). Negative for abdominal distention, abdominal pain, blood in stool, nausea and vomiting.  Genitourinary:  Negative for difficulty urinating and  hematuria.  Musculoskeletal:  Negative for arthralgias, myalgias and neck pain.  Skin:  Negative for rash.  Neurological:  Negative for dizziness, seizures, syncope and headaches.  Hematological:  Negative for adenopathy. Does not bruise/bleed easily.  Psychiatric/Behavioral:  Negative for dysphoric mood. The patient is not nervous/anxious.     Objective:  BP 138/72   Pulse 65   Temp 98.1 F (36.7 C) (Oral)   Ht 5' 3 (1.6 m)   Wt 130 lb 12.8 oz (59.3 kg)   LMP  (LMP Unknown)   SpO2 97%   BMI 23.17 kg/m   Wt Readings from Last 3 Encounters:  09/01/24 130 lb 12.8 oz (59.3 kg)  05/14/24 128 lb (58.1 kg)  04/22/24 128 lb (58.1 kg)      Physical Exam Vitals and nursing note reviewed.  Constitutional:      Appearance: Normal appearance. She is not ill-appearing.  HENT:     Head: Normocephalic and atraumatic.     Right Ear: Tympanic membrane, ear canal and external ear normal. There is no impacted cerumen.     Left Ear: Tympanic membrane, ear canal and external ear normal. There is no impacted cerumen.     Mouth/Throat:     Mouth: Mucous membranes are moist.     Pharynx: Oropharynx is clear. No oropharyngeal exudate or posterior oropharyngeal erythema.  Eyes:     General:        Right eye: No discharge.        Left eye: No discharge.     Extraocular Movements: Extraocular movements intact.     Conjunctiva/sclera: Conjunctivae normal.     Pupils: Pupils are equal, round, and reactive to light.  Neck:     Thyroid : No thyroid  mass or thyromegaly.  Cardiovascular:     Rate and Rhythm: Normal rate and regular rhythm.     Pulses: Normal pulses.     Heart sounds: Normal heart sounds. No murmur heard. Pulmonary:     Effort: Pulmonary effort is normal. No respiratory distress.  Breath sounds: Normal breath sounds. No wheezing, rhonchi or rales.  Abdominal:     General: Bowel sounds are normal. There is no distension.     Palpations: Abdomen is soft. There is no mass.      Tenderness: There is no abdominal tenderness. There is no guarding or rebound.     Hernia: No hernia is present.  Musculoskeletal:     Cervical back: Normal range of motion and neck supple. No rigidity.     Right lower leg: No edema.     Left lower leg: No edema.     Comments:  Discomfort with SLR on left. No pain with int/ext rotation at bilateral hips  Lymphadenopathy:     Cervical: No cervical adenopathy.  Skin:    General: Skin is warm and dry.     Findings: No rash.  Neurological:     General: No focal deficit present.     Mental Status: She is alert. Mental status is at baseline.     Comments:  Knows date President recall - doesn't remember  Recall 2/3, 2/3 with cue Calculation 5/5 DLROW  Psychiatric:        Mood and Affect: Mood normal.        Behavior: Behavior normal.       Results for orders placed or performed in visit on 09/01/24  HM DIABETES EYE EXAM   Collection Time: 07/10/24 12:00 AM  Result Value Ref Range   HM Diabetic Eye Exam Retinopathy (A) No Retinopathy   Lab Results  Component Value Date   NA 140 08/19/2024   CL 104 08/19/2024   K 4.0 08/19/2024   CO2 25 08/19/2024   BUN 33 (H) 08/19/2024   CREATININE 1.00 08/19/2024   GFR 56.49 (L) 08/19/2024   CALCIUM 10.0 08/19/2024   PHOS 5.6 (H) 03/28/2016   ALBUMIN 4.5 08/19/2024   GLUCOSE 124 (H) 08/19/2024    Lab Results  Component Value Date   CHOL 147 08/19/2024   HDL 45.90 08/19/2024   LDLCALC 83 08/19/2024   LDLDIRECT 163.2 05/17/2009   TRIG 91.0 08/19/2024   CHOLHDL 3 08/19/2024    Lab Results  Component Value Date   HGBA1C 6.9 (H) 08/19/2024    Lab Results  Component Value Date   ALT 12 08/19/2024   AST 14 08/19/2024   ALKPHOS 68 08/19/2024   BILITOT 0.5 08/19/2024    Lab Results  Component Value Date   WBC 6.8 08/19/2024   HGB 12.7 08/19/2024   HCT 39.5 08/19/2024   MCV 82.8 08/19/2024   PLT 233.0 08/19/2024    Assessment & Plan:   Problem List Items Addressed This  Visit     Medicare annual wellness visit, subsequent - Primary (Chronic)   I have personally reviewed the Medicare Annual Wellness questionnaire and have noted 1. The patient's medical and social history 2. Their use of alcohol , tobacco or illicit drugs 3. Their current medications and supplements 4. The patient's functional ability including ADL's, fall risks, home safety risks and hearing or visual impairment. Cognitive function has been assessed and addressed as indicated.  5. Diet and physical activity 6. Evidence for depression or mood disorders The patients weight, height, BMI have been recorded in the chart. I have made referrals, counseling and provided education to the patient based on review of the above and I have provided the pt with a written personalized care plan for preventive services. Provider list updated.. See scanned questionairre as needed for further documentation. Reviewed  preventative protocols and updated unless pt declined.       History of renal transplant (Chronic)   Continues cellcept , prograf  through nephrology.       Advanced care planning/counseling discussion (Chronic)   Advanced directive - does not have this yet. Packet previously provided. HCPOA would be husband and sister. Full code, would be ok with feeding tube. Would not want prolonged life support if terminal condition.       Health maintenance examination (Chronic)   Preventative protocols reviewed and updated unless pt declined. Discussed healthy diet and lifestyle.       Immunosuppressed status (Chronic)   On cellcept /program.      Vitamin D  deficiency   Continue vit D 2000 units daily.       Primary open angle glaucoma (POAG) of both eyes, severe stage   Sees Dr Bethany ophthalmology at [facility] Worsening vision despite treatment / surgeries.       Relevant Medications   ciprofloxacin  (CILOXAN ) 0.3 % ophthalmic solution   Type 2 diabetes mellitus with other specified complication  (HCC)   Chronic, diet controlled, with A1c stable at 6.9% Continue to encourage adherence to diabetic diet.  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      Hyperlipidemia associated with type 2 diabetes mellitus (HCC)   Chronic, stable on high dose pravastatin  - continue  The 10-year ASCVD risk score (Arnett DK, et al., 2019) is: 24.4%   Values used to calculate the score:     Age: 26 years     Clincally relevant sex: Female     Is Non-Hispanic African American: Yes     Diabetic: Yes     Tobacco smoker: No     Systolic Blood Pressure: 138 mmHg     Is BP treated: Yes     HDL Cholesterol: 45.9 mg/dL     Total Cholesterol: 147 mg/dL       GERD (gastroesophageal reflux disease)   Chronic, stable on pantoprazole  20mg  daily      Mixed incontinence urge and stress   Myrbetriq was helpful but now off this. Consider restarting.       Incontinence of feces   S/p sacral nerve stimulator placement.  Notes ongoing, worsening fecal incontinence.  Requests reassessment.  She is due  for 1 year f/u with general surgeon Dr Debby - # provided to schedule appt      MCI (mild cognitive impairment) with memory loss   Stable period, but was unable to recall president name. Also recall 2/3 words  Namenda  caused marked mouth dryness.  Consider rpt MMSE at 6 mo f/u, consider trial of aricept, previously avoiding in GERD hx.  Reviewed lifestyle strategies to support a healthy mind, checklist provided      Osteoporosis   Continue calcium, fosamax .  Consider updating DEXA next visit Activity limited by back/leg issues.      Chronic lower back pain   S/p minimally invasive transforaminal lumbar interbody fusion 12/2023. Ongoing trouble with this - see below.  She does have neurosurgery f/u planned for next week.       Essential hypertension   Chronic, stable. Continue current regimen of amlodipine  2.5mg  daily, carvedilol  12.5mg  bid, hydrochlorothiazide  12.5mg  daily.       Type  II diabetes mellitus with renal manifestations (HCC)   Other Visit Diagnoses       Encounter for immunization       Relevant Orders   Flu vaccine HIGH DOSE PF(Fluzone Trivalent) (Completed)  No orders of the defined types were placed in this encounter.   Orders Placed This Encounter  Procedures   Flu vaccine HIGH DOSE PF(Fluzone Trivalent)    Patient Instructions  Flu shot today  Good to see you today  Reviewed 4 core lifestyle modifications to support a healthy mind:  1. Nutritious well balance diet.  2. Regular physical activity routine.  3. Regular mental activity such as reading books, word puzzles, math puzzles, jigsaw puzzles.  4. Social engagement.  Also ensure good blood pressure control, limit alcohol , no smoking.    Return in 6 months for diabetes and follow up visit.   Follow up plan: Return in about 6 months (around 03/02/2025) for follow up visit.  Anton Blas, MD

## 2024-09-01 NOTE — Assessment & Plan Note (Addendum)
 S/p minimally invasive transforaminal lumbar interbody fusion 12/2023. Ongoing trouble with this - see below.  She does have neurosurgery f/u planned for next week.

## 2024-09-01 NOTE — Assessment & Plan Note (Addendum)
 Stable period, but was unable to recall president name. Also recall 2/3 words  Namenda  caused marked mouth dryness.  Consider rpt MMSE at 6 mo f/u, consider trial of aricept, previously avoiding in GERD hx.  Reviewed lifestyle strategies to support a healthy mind, checklist provided

## 2024-09-01 NOTE — Assessment & Plan Note (Signed)
 On cellcept /program.

## 2024-09-01 NOTE — Assessment & Plan Note (Addendum)
 Chronic, diet controlled, with A1c stable at 6.9% Continue to encourage adherence to diabetic diet.  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

## 2024-09-01 NOTE — Assessment & Plan Note (Signed)
 S/p sacral nerve stimulator placement.  Notes ongoing, worsening fecal incontinence.  Requests reassessment.  She is due  for 1 year f/u with general surgeon Dr Debby - # provided to schedule appt

## 2024-09-01 NOTE — Telephone Encounter (Signed)
 [facility] called to let us  know that they are having a hard time getting in contact with her, but they have however faxed some information about her PT over via fax. Will let you know once it comes through.

## 2024-09-01 NOTE — Assessment & Plan Note (Signed)
 Continue calcium, fosamax .  Consider updating DEXA next visit Activity limited by back/leg issues.

## 2024-09-01 NOTE — Assessment & Plan Note (Signed)
 Sees Dr Bethany ophthalmology at [facility] Worsening vision despite treatment / surgeries.

## 2024-09-01 NOTE — Assessment & Plan Note (Signed)
 Continue vit D 2000 units daily

## 2024-09-01 NOTE — Assessment & Plan Note (Signed)
 Chronic, stable. Continue current regimen of amlodipine  2.5mg  daily, carvedilol  12.5mg  bid, hydrochlorothiazide  12.5mg  daily.

## 2024-09-01 NOTE — Assessment & Plan Note (Signed)

## 2024-09-01 NOTE — Assessment & Plan Note (Signed)
 Myrbetriq was helpful but now off this. Consider restarting.

## 2024-09-01 NOTE — Telephone Encounter (Signed)
 Reviewing chart, she is due for f/u with general surgeon Dr DELENA Ned for 1 year follow up visit after sacral nerve stimulator placement.  Recommend we start there, not with GI docs.  She may call [phone] [facility] [place] [facility] to schedule an appointment.

## 2024-09-01 NOTE — Assessment & Plan Note (Signed)
 Preventative protocols reviewed and updated unless pt declined. Discussed healthy diet and lifestyle.

## 2024-09-01 NOTE — Patient Instructions (Addendum)
 Flu shot today  Good to see you today  Reviewed 4 core lifestyle modifications to support a healthy mind:  1. Nutritious well balance diet.  2. Regular physical activity routine.  3. Regular mental activity such as reading books, word puzzles, math puzzles, jigsaw puzzles.  4. Social engagement.  Also ensure good blood pressure control, limit alcohol , no smoking.    Return in 6 months for diabetes and follow up visit.

## 2024-09-01 NOTE — Assessment & Plan Note (Signed)
 Chronic, stable on high dose pravastatin  - continue  The 10-year ASCVD risk score (Arnett DK, et al., 2019) is: 24.4%   Values used to calculate the score:     Age: 73 years     Clincally relevant sex: Female     Is Non-Hispanic African American: Yes     Diabetic: Yes     Tobacco smoker: No     Systolic Blood Pressure: 138 mmHg     Is BP treated: Yes     HDL Cholesterol: 45.9 mg/dL     Total Cholesterol: 147 mg/dL

## 2024-09-01 NOTE — Assessment & Plan Note (Signed)
 Chronic, stable on pantoprazole '20mg'$  daily.

## 2024-09-02 NOTE — Telephone Encounter (Signed)
 Please add patient to wait list and keep her appt for 09/08/24 per Lyle.

## 2024-09-02 NOTE — Telephone Encounter (Signed)
 Has an appointment with Dr Claudene on 09/08/2024-does patient need to come in sooner? No availability right now. Any other recommendations?

## 2024-09-02 NOTE — Telephone Encounter (Signed)
 Added to wait list.

## 2024-09-02 NOTE — Telephone Encounter (Signed)
  Media Information  Document Information  AMB Correspondence  VANETTA MASH PROVIDER NOTE  09/01/2024 10:54  Attached To:  Candis SHAUNNA Cordon  Source Information  Default, Provider, MD

## 2024-09-02 NOTE — Telephone Encounter (Signed)
 Voicemail not set up.    Please provide message from provider/office when call is returned from patient.

## 2024-09-03 NOTE — Telephone Encounter (Signed)
 Called patient reviewed all information and repeated back to me. Will call if any questions.  Number provided to patient she will reach to if any issues getting scheduled.

## 2024-09-04 DIAGNOSIS — H401123 Primary open-angle glaucoma, left eye, severe stage: Secondary | ICD-10-CM | POA: Diagnosis not present

## 2024-09-06 ENCOUNTER — Other Ambulatory Visit: Payer: Self-pay | Admitting: Family Medicine

## 2024-09-06 DIAGNOSIS — K219 Gastro-esophageal reflux disease without esophagitis: Secondary | ICD-10-CM

## 2024-09-08 ENCOUNTER — Ambulatory Visit: Admitting: Neurosurgery

## 2024-09-08 ENCOUNTER — Encounter: Payer: Self-pay | Admitting: Neurosurgery

## 2024-09-08 VITALS — BP 122/60 | Ht 63.0 in | Wt 130.0 lb

## 2024-09-08 DIAGNOSIS — M25552 Pain in left hip: Secondary | ICD-10-CM | POA: Diagnosis not present

## 2024-09-08 DIAGNOSIS — Z981 Arthrodesis status: Secondary | ICD-10-CM

## 2024-09-08 DIAGNOSIS — M5416 Radiculopathy, lumbar region: Secondary | ICD-10-CM | POA: Diagnosis not present

## 2024-09-08 NOTE — Progress Notes (Signed)
   REFERRING PHYSICIAN:  Rilla Baller, Md [address] [place],  [place] [address]  DOS: 01/03/24 MIS TLIF at L4-S1, left approach   Discussed the use of AI scribe software for clinical note transcription with the patient, who gave verbal consent to proceed.  History of Present Illness Kristen Kirk is a 73 year old female with a history of spinal surgery who presents with left groin and hip pain. She describes severe left groin pain that prevents her from standing upright in the mornings. She must bend forward and walk in the hallway before she can stand straight. Prolonged sitting, such as watching television, worsens the pain and makes rising difficult, so she uses a cane. She denies recent falls or trauma. She has prior spinal surgery. Past physical therapy helped briefly but pain recurs with activity. She has had injections in the past, though not recently.  PHYSICAL EXAMINATION:  General: Patient is well developed, well nourished, calm, collected, and in no apparent distress.   NEUROLOGICAL:  General: In no acute distress.   Awake, alert, oriented to person, place, and time.  Pupils equal round and reactive to light.  Facial tone is symmetric.    Strength:            Side Iliopsoas Quads Hamstring PF DF EHL  R 5 5 5 5 5 5   L 5 5 5 5 5 5    Incision c/d/i   ROS (Neurologic):  Negative except as noted above  IMAGING:  X-rays pending  Assessment and Plan Assessment & Plan Lumbar radiculopathy, post lumbar fusion Chronic lumbar radiculopathy with pain radiating to the left groin area, exacerbated by prolonged sitting and standing. Previous MIS TLIF at L4-S1. Current pain is higher up than previous surgical site, suggesting possible involvement of higher lumbar levels. Differential includes compression at  higher lumbar levels and potential need for further intervention. Avoidance of extensive fusion surgery due to previous experience and current pain pattern. -  Referred to pain management for injections at higher lumbar levels (L2-L3) to address left groin pain. - Discussed potential for spinal stimulator if injections are ineffective. - Will consider decompression surgery if injections fail and pain persists. - Avoid extensive fusion surgery due to previous experience and current pain pattern.  Left hip pain Intermittent left hip pain, possibly related to lumbar radiculopathy or independent hip pathology. Pain is significant enough to affect daily activities and mobility. - Referred to orthopedics for evaluation of left hip to rule out hip pathology. - Will coordinate with orthopedic team for further assessment and management.    [name] Sharps, MD Department of neurosurgery

## 2024-09-10 ENCOUNTER — Ambulatory Visit

## 2024-09-10 DIAGNOSIS — M1612 Unilateral primary osteoarthritis, left hip: Secondary | ICD-10-CM | POA: Diagnosis not present

## 2024-09-10 DIAGNOSIS — M25552 Pain in left hip: Secondary | ICD-10-CM

## 2024-09-10 NOTE — Patient Instructions (Signed)

## 2024-09-10 NOTE — Progress Notes (Signed)
 Orthopaedic Surgery New Patient Visit   History of Present Illness: The patient is a 73 y.o. female seen in clinic for 45-month history of left hip pain.  Patient states left hip pain has been present for several years, however more severe over past 1 to 2 months.  No injury/trauma.  Patient describes pain as throbbing.  Constant but significantly exacerbated with walking.  Has noticed it hurts when moving to get in and out of a vehicle.  Denies radiating pain to left lower extremity including thigh, lower leg, foot/toes.  Has been using over-the-counter Tylenol  with minimal symptom improvement.  Patient states she has started course of physical therapy ([employer]), did help but did not resolve pain.  Patient has been using single-point cane for ambulation assistance.  Patient does have associated low back pain that will occasionally wraparound to the lateral aspect of hips.  Patient seen by Dr. Penne Sharps with [facility] at [facility] on 09/08/2024, earlier this week.  Patient reported left groin and left hip pain.  Patient with underlying chronic lumbar radiculopathy, previous MIS TLIF at L4-S1 on 01/03/2024. Patient referred to pain management for possible LESI. Neurosurgery also considering spinal stimulator and/or decompression surgery if pain refractory to injections.  Patient does have placement of sacral nerve stimulator for management of stool incontinence.  Has been present for several years.  Patient has DM2, last A1c 6.9% on 08/19/2024, 3 weeks ago.  BUN 20, creatinine 0.71, eGFR 90 on 09/01/2024.  Patient is retired.  Patient accompanied to today's appointment by sister, Nick.   Past Medical, Social and Family History: Past Medical History:  Diagnosis Date   Anemia in chronic kidney disease    a. s/p aranesp 11/2013 now starting epo with HD 06/2014   Chronic low back pain    CKD (chronic kidney disease), stage V (HCC) 01/2014   Diabetes mellitus type II    a.  Currently diet controlled since losing weight.   DJD (degenerative joint disease)    Essential hypertension    Fecal incontinence    decreased internal sphincter function   GERD (gastroesophageal reflux disease)    History of echocardiogram    a. 01/2015 Echo [facility]): EF ?55%, triv MR, mildly dil LA, nl PV.   History of end stage renal disease    previous on HD;   received kidney transplant 07/ 2018 (on dialysis since 2015 MWF)  followed by [facility] renal (Dr. Sigmund) considering transplant Access: LUE fistula Establishing with [place] [facility]   History of stress test    a. 03/2013 Myoview [facility]): EF 63%, no ischemia.   History of syncope    a. 12/2015.   History of Urge Incontinence    HLD (hyperlipidemia)    Immunosuppressed status    Mixed incontinence urge and stress    Osteoarthritis    Osteoporosis 05/03/2019   DEXA 04/2019 - 1.5 spine, -2.1 hip, -2.5 forearm Check phosphate and PTH to guide further management in h/o kidney transplant   Primary open angle glaucoma (POAG) of both eyes    a. L>R   S/P kidney transplant 04/2017   @[facility] ---   per pt one kidney from living-donor   Spinal stenosis of lumbar region    Spondylolisthesis of lumbar region    Vaginal atrophy    a. vagifem  caused spotting, normal TVS   Vitamin D  deficiency    Wears glasses    stated on 05/31/2022   Wears partial dentures    upper 2 teeth. 05/31/2022  Past Surgical History:  Procedure Laterality Date   ANAL RECTAL MANOMETRY N/A 03/16/2021   Procedure: ANO RECTAL MANOMETRY;  Surgeon: Shila Gustav GAILS, MD;  Location: [facility];  Service: Endoscopy;  Laterality: N/A;   APPLICATION OF INTRAOPERATIVE CT SCAN N/A 01/03/2024   Procedure: APPLICATION OF INTRAOPERATIVE CT SCAN;  Surgeon: Claudene Penne ORN, MD;  Location: [facility] ORS;  Service: Neurosurgery;  Laterality: N/A;   AQUEOUS SHUNT Left 02/25/2021   AV FISTULA PLACEMENT, RADIOCEPHALIC Left 02/20/2014   BLADDER SUSPENSION  2011   for stress  incontinence-Dr. MacDiarmid   CATARACT EXTRACTION W/ INTRAOCULAR LENS IMPLANT Right 02/11/2020   CATARACT EXTRACTION W/ INTRAOCULAR LENS IMPLANT Left 11/23/2021   COLONOSCOPY  03/2019   diverticulosis, rpt 10 yrs (Nandigam)   KIDNEY TRANSPLANT Right 04/02/2017   [facility])   LIGATION OF ARTERIOVENOUS  FISTULA Left 10/30/2017   THROMBOSED AVF   NEPHRECTOMY TRANSPLANTED ORGAN     REFRACTIVE SURGERY     REFRACTIVE SURGERY Left 06/2011   SACRAL NERVE STIMULATOR PLACEMENT  07/2022   TRABECULECTOMY Right 04/17/2012   TRANSFORAMINAL LUMBAR INTERBODY FUSION W/ MIS 2 LEVEL N/A 01/03/2024   Procedure: MINIMALLY INVASIVE (MIS) TRANSFORAMINAL LUMBAR INTERBODY FUSION (TLIF) 2 LEVEL;  Surgeon: Claudene Penne ORN, MD;  Location: [facility] ORS;  Service: Neurosurgery;  Laterality: N/A;  L4-S1 MAXIMUM ACCESS (MAS) TRANSFORAMINAL LUMBAR INTERBODY FUSION (TLIF), LEFT APPROACH   TUBAL LIGATION Bilateral    1990s   Allergies  Allergen Reactions   Shellfish Protein-Containing Drug Products Swelling   Baclofen  Other (See Comments)    hallucination   Brimonidine     Eye redness   Tramadol      Dizziness even at 1/2 tab   Current Outpatient Medications on File Prior to Visit  Medication Sig Dispense Refill   acetaminophen  (TYLENOL ) 500 MG tablet Take 500 mg by mouth 3 (three) times daily as needed for moderate pain (pain score 4-6) or mild pain (pain score 1-3).     alendronate  (FOSAMAX ) 70 MG tablet Take 1 tablet (70 mg total) by mouth every 7 (seven) days. Take with a full glass of water  on an empty stomach. 12 tablet 4   amLODipine  (NORVASC ) 2.5 MG tablet Take 2.5 mg by mouth daily.     apraclonidine  (IOPIDINE ) 0.5 % ophthalmic solution Place 1 drop into the right eye in the morning and at bedtime.     aspirin  EC 81 MG tablet Take 81 mg by mouth daily.     atropine 1 % ophthalmic solution Place 1 drop into the right eye 2 (two) times daily.     Blood Glucose Monitoring Suppl (ONE TOUCH ULTRA SYSTEM KIT)  W/DEVICE KIT 1 kit by Does not apply route once. 1 each 0   Carboxymethylcellulose Sodium 0.25 % SOLN Place 1 drop into both eyes as needed.     carvedilol  (COREG ) 25 MG tablet Take 12.5 mg by mouth 2 (two) times daily with a meal.     Cholecalciferol  (VITAMIN D ) 50 MCG (2000 UT) CAPS Take 2,000 Units by mouth daily.     ciprofloxacin  (CILOXAN ) 0.3 % ophthalmic solution Place 1 drop into the right eye 4 (four) times daily.     cyclobenzaprine  (FLEXERIL ) 5 MG tablet Take 1 tablet (5 mg total) by mouth 3 (three) times daily as needed for muscle spasms. 30 tablet 0   docusate sodium  (COLACE) 100 MG capsule Take 100 mg by mouth 2 (two) times daily as needed for mild constipation or moderate constipation.  0  dorzolamide -timolol  (COSOPT ) 2-0.5 % ophthalmic solution Place 1 drop into both eyes 2 (two) times daily.     glucose blood test strip Check sugars once daily 100 each 11   hydrochlorothiazide  (HYDRODIURIL ) 12.5 MG tablet Take 1 tablet (12.5 mg total) by mouth daily. 90 tablet 4   latanoprost  (XALATAN ) 0.005 % ophthalmic solution Place 1 drop into the right eye at bedtime.     Melatonin 3 MG CAPS Take 1-2 capsules (3-6 mg total) by mouth at bedtime as needed (sleep).  0   Multiple Vitamin (MULTIVITAMIN) tablet Take 1 tablet by mouth daily.     mycophenolate  (CELLCEPT ) 500 MG tablet Take 500 mg by mouth 2 (two) times daily.     pantoprazole  (PROTONIX ) 20 MG tablet Take 1 tablet by mouth once daily 90 tablet 0   pravastatin  (PRAVACHOL ) 80 MG tablet Take 1 tablet (80 mg total) by mouth every evening. 90 tablet 4   prednisoLONE  acetate (PRED FORTE ) 1 % ophthalmic suspension Place 1 drop into the left eye in the morning, at noon, and at bedtime.     senna (SENOKOT) 8.6 MG TABS tablet Take 1 tablet (8.6 mg total) by mouth 2 (two) times daily.     tacrolimus  (PROGRAF ) 0.5 MG capsule Take 0.5-1 mg by mouth See admin instructions. Take 2 capsules (1mg ) by mouth every morning and take 1 capsule (0.5mg ) by  mouth every night     traZODone  (DESYREL ) 50 MG tablet Take 0.5-1 tablets (25-50 mg total) by mouth at bedtime as needed for sleep. 30 tablet 3   No current facility-administered medications on file prior to visit.   Social History   Tobacco Use   Smoking status: Former    Current packs/day: 0.00    Types: Cigarettes    Quit date: 1987    Years since quitting: 38.9   Smokeless tobacco: Never   Tobacco comments:    Remote- quit ~ late 1980's.  Vaping Use   Vaping status: Never Used  Substance Use Topics   Alcohol  use: No    Alcohol /week: 0.0 standard drinks of alcohol    Drug use: No      I have reviewed past medical, surgical, social and family history, medications and allergies as documented in the EMR.  Review of Systems - A ROS was performed including pertinent positives and negatives as documented in the HPI.     Physical Exam:  General/Constitutional: NAD Vascular: No edema, swelling or tenderness, except as noted in detailed exam Integumentary: No impressive skin lesions present, except as noted in detailed exam Neuro/Psych: Normal mood and affect, oriented to person, place and time Musculoskeletal: Normal, except as noted in detailed exam and in HPI Positive left straight leg raise   Focused Orthopaedic Examination:  Hip Examination (focused):  Left hip normal to inspection. No gross deformity. Mild tenderness with palpation in the groin. Mild tenderness with palpation over left IT band; no point tenderness over greater trochanter.     RIGHT LEFT  ROM (degrees): Extension - Flexion 15-120 15-120    Abduction 30 30    Adduction 20 20    IR 35 35    ER 45 45   Palpation (pain): Anterior Not performed positive   Iliopsoas negative negative   Greater troch negative negative   ASIS negative negative   AIIS negative negative   Posterior positive positive   Adductors negative negative  Special Tests: FADIR negative Positive (minimal)   FABER negative  Positive (minimal)   Stinchfield negative  Positive (weakness)   Log Roll negative negative   Ober's negative positive  Other: Hip flexion strength  4+/5 4+/5   Hip adductor strength  5/5  5/5   Hip abductor strength  5/5 5/5    Vascular/Lymphatic: 2+ dorsalis pedis/posterior tibialis pulses,  foot warm and well perfused Neurologic: Sensation intact to light touch to Superficial peroneal/Deep peroneal/Tibial/Sural/Saphenous nerves    XR Left Hip Imaging: X-rays of the Left Hip including 3-views (AP Pelvis, AP/Lateral left hip) obtained today 09/10/2024 in office and were reviewed personally by me and with patient.  Per my independent interpretation these images show mild degenerative changes with minimal joint space loss of the bilateral hips. Mild subchondral sclerosis along weightbearing portion of acetabulum bilaterally. No acute fracture. No dislocation. Lower lumbar fusion and sacral stimulator visualized.    Radiology Read: Lumbar Spine Xray on 05/14/2024 IMPRESSION: 1. Stable 6-8 mm retrolisthesis at L1-2, L2-3, and L3-4. 2. Stable marked disc space narrowing and remodeling at T12-L4, most severe at T12-L2, consistent with severe degenerative disc disease. 3. Anterior and posterior lumbar fusion with instrumentation of L4-S1. No definite bridging callus identified on this limited examination.    Assessment:  Left hip osteoarthritis Lumbar radiculopathy, post lumbar fusion  Plan:  Patient was seen and examined in office today. We reviewed patient's history, examination, and imaging in detail. Based on information available for this encounter, patient with 1 to 2 months of worsening left groin pain.  No precipitating injury/trauma.  Patient with history of L4-S1 lumbar fusion and known degenerative disc disease at higher lumbar levels.  Patient on physical exam with preserved hip range of motion, mild hip flexion strength loss, and positive FADIR/FABER.  Patient's imaging/left  hip x-ray performed in office today revealed mild osteoarthritic/degenerative changes of the left hip.  Discussed current symptoms and differential diagnosis with patient.  Believe she may have multiple pain generators including lumbar radiculopathy at the L1-L3 level as well as left hip osteoarthritis.  Agree with neurosurgery's recommendation for referral to pain management for diagnostic/therapeutic injections, including possible lumbar epidural steroid injection and/or left hip intra-articular steroid injection for diagnostic and therapeutic purposes.  If intra-articular hip injection does provide significant relief of groin pain, next steps can be discussed with patient as she may require evaluation by hip arthroplasty specialist.  Advised continuation of ice/heat, physical therapy, over-the-counter Tylenol  for pain management.   Scheduled follow-up with [facility] office in 8 weeks for reevaluation, sooner if any new/worsening symptoms or concerns.    Patient education material was provided.  All questions, concerns and comments were addressed to the best of my ability.  Follow-up: 8 weeks   I discussed with the patient today that I will be transitioning out of my role within the near future. In order to provide appropriate continuity of care, we offered the patient options for follow up regarding their orthopedic concerns. Patient has chosen to follow up with [facility].  Patient may reach out to our office if there are any difficulties in scheduling follow up care. The patient understands who to contact for future orthopedic concerns and has contact information for the receiving practice.   Arlyss GEANNIE Schneider, DO Orthopedic Surgery & Sports Medicine [place]   This document was dictated using Dragon voice recognition software. A reasonable attempt at proof reading has been made to minimize errors.

## 2024-09-16 ENCOUNTER — Telehealth: Payer: Self-pay

## 2024-09-16 NOTE — Telephone Encounter (Signed)
 Kristen Kirk  Looks like Dr Claudene but this referral in. Do you mind reaching out to her about this

## 2024-09-16 NOTE — Telephone Encounter (Signed)
 Pt called requesting a call from Autumn H. Pt asking for call concerning her pains and see if her appt can be pushed up at [facility]. Unsure if we can call them to get sooner appt for pt. Pt number is [phone]

## 2024-09-16 NOTE — Telephone Encounter (Signed)
 Spoke with the patient and notified her that she should call Physiatry at [facility] to see if they could see her sooner. I gave her the phone number to their office so she could call, she verbalized understanding.

## 2024-09-25 ENCOUNTER — Other Ambulatory Visit: Payer: Self-pay

## 2024-09-25 ENCOUNTER — Emergency Department
Admission: EM | Admit: 2024-09-25 | Discharge: 2024-09-25 | Disposition: A | Discharge status: Home / Self Care | Attending: Emergency Medicine | Admitting: Emergency Medicine

## 2024-09-25 DIAGNOSIS — M25552 Pain in left hip: Secondary | ICD-10-CM | POA: Insufficient documentation

## 2024-09-25 MED ORDER — HYDROCODONE-ACETAMINOPHEN 5-325 MG PO TABS
1.0000 | ORAL_TABLET | Freq: Once | ORAL | Status: AC
  Administered 2024-09-25: 1 via ORAL
  Filled 2024-09-25: qty 1

## 2024-09-25 MED ORDER — HYDROCODONE-ACETAMINOPHEN 5-325 MG PO TABS: 1.0000 | ORAL_TABLET | ORAL | 0 refills | Status: AC | PRN

## 2024-09-25 MED ORDER — HYDROCODONE-ACETAMINOPHEN 5-325 MG PO TABS: 1.0000 | ORAL_TABLET | ORAL | 0 refills | Status: DC | PRN

## 2024-09-25 NOTE — ED Provider Notes (Signed)
 "   [facility] Emergency Department Provider Note     Event Date/Time   First MD Initiated Contact with Patient 09/25/24 1335     (approximate)   History   Groin Pain   HPI  Kristen Kirk is a 73 y.o. female presents to the ED with left hip pain ongoing for 3 months. She had lumbar surgery approximatley 3 months ago when the pain initially started. She took tylenol  with some relief but the pain persisted. No new injuries. No falls. Good ROM of hip, no skin color changes.  Patient is still able to ambulate with the assistance of her cane which is her baseline.  She reports pain is worse in the morning upon waking up and at times radiates deep into her groin pelvic rim region..  Muscle relaxants at home are not working.        Physical Exam   Triage Vital Signs: ED Triage Vitals  Encounter Vitals Group     BP 09/25/24 1259 (!) 146/69     Girls Systolic BP Percentile --      Girls Diastolic BP Percentile --      Boys Systolic BP Percentile --      Boys Diastolic BP Percentile --      Pulse Rate 09/25/24 1259 82     Resp 09/25/24 1259 20     Temp 09/25/24 1259 98.2 F (36.8 C)     Temp Source 09/25/24 1259 Oral     SpO2 09/25/24 1259 97 %     Weight 09/25/24 1301 129 lb 13.6 oz (58.9 kg)     Height 09/25/24 1301 5' 3 (1.6 m)     Head Circumference --      Peak Flow --      Pain Score 09/25/24 1300 9     Pain Loc --      Pain Education --      Exclude from Growth Chart --     Most recent vital signs: Vitals:   09/25/24 1259  BP: (!) 146/69  Pulse: 82  Resp: 20  Temp: 98.2 F (36.8 C)  SpO2: 97%    General Awake, no distress HEENT NCAT.  CV:  Good peripheral perfusion.  RESP:  Normal effort.  ABD:  No distention.  Other:  No visible deformity to left hip.  There is no color changes.  There is no tenderness to palpation.  No edema.  Full passive and active range of motion at hip joint.  Patient is ambulatory with the assistance of  her cane and gait is steady.  Normal lower extremity strength.  Neurovascular status intact all throughout.   ED Results / Procedures / Treatments   Labs (all labs ordered are listed, but only abnormal results are displayed) Labs Reviewed - No data to display  No results found.  PROCEDURES:  Critical Care performed: No  Procedures   MEDICATIONS ORDERED IN ED: Medications  HYDROcodone -acetaminophen  (NORCO/VICODIN) 5-325 MG per tablet 1 tablet (1 tablet Oral Given 09/25/24 1440)     IMPRESSION / MDM / ASSESSMENT AND PLAN / ED COURSE  I reviewed the triage vital signs and the nursing notes.                              Clinical Course as of 09/25/24 1832  Thu Sep 25, 2024  1431 Normally use a cane to walk. Hip xray 12/10 [MH]  Clinical Course User Index [MH] Margrette Monte A, PA-C    73 y.o. female presents to the emergency department for evaluation and treatment of left hip pain. See HPI for further details.   Differential diagnosis includes, but is not limited to strain, spasm, postop complication, less likely fracture however considered  Patient's presentation is most consistent with acute, uncomplicated illness.  Chart reviewed patient had recent hip x-ray on 12/10 for the same complaint.  Reassuring results.  She was told to alternate ibuprofen and Tylenol  however patient reports she cannot take ibuprofen.  Given history of pain onset early in the morning I do suspect arthritis being a factor.  Short course of Norco sent to pharmacy for breakthrough pain.  First dose given in ED considering holiday.  Patient is in stable and satisfactory condition for discharge home.  Advise close follow-up with her primary care provider for further evaluation.  ED return precaution discussed.  FINAL CLINICAL IMPRESSION(S) / ED DIAGNOSES   Final diagnoses:  Left hip pain   Rx / DC Orders   ED Discharge Orders          Ordered    HYDROcodone -acetaminophen  (NORCO/VICODIN)  5-325 MG tablet  Every 4 hours PRN,   Status:  Discontinued        09/25/24 1508    HYDROcodone -acetaminophen  (NORCO/VICODIN) 5-325 MG tablet  Every 4 hours PRN        09/25/24 1509             Note:  This document was prepared using Dragon voice recognition software and may include unintentional dictation errors.    Margrette Monte A, PA-C 09/25/24 1832  "

## 2024-09-25 NOTE — ED Triage Notes (Signed)
 Pt ambulatory with cane to triage. Pt had lumbar spinal surgery 2.5 months ago. Complains that since surgery has had L groin pain, worse with walking. States muscle relaxants are not working.

## 2024-09-25 NOTE — Discharge Instructions (Signed)
 Follow-up with your primary care provider

## 2024-10-10 ENCOUNTER — Inpatient Hospital Stay: Admission: RE | Admit: 2024-10-10 | Source: Ambulatory Visit

## 2024-10-13 ENCOUNTER — Telehealth: Payer: Self-pay

## 2024-10-13 DIAGNOSIS — M1612 Unilateral primary osteoarthritis, left hip: Secondary | ICD-10-CM

## 2024-10-13 DIAGNOSIS — M5416 Radiculopathy, lumbar region: Secondary | ICD-10-CM

## 2024-10-13 NOTE — Progress Notes (Signed)
 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 Advanced POAG OS > OD Severe stage OS Moderate OD.   OD:  s/p Trab with [facility] (3 min) OD 04/13/12 s/p 5-FU #2  - HVF with flux, sup arcuate seen in 2015, but inferior changes not present today  OS:  ONH S/P hx [place]ance heme s/p SLT OS 2008, s/p SLT OS 05/29/11  Prior IOP 25, 26 on travatan  OU QHS, Cosopt  BID OU (per Dr Ivonne)  s/p trab/[facility] OS (08/30/11) s/p LSL #1 (09/04/11), #2 and #3 (09/14/11) s/p 5FU #5 (09/28/11). HVF with progression.  - HVF total depression, but mean deviation stable  Current meds: cosopt  BID OU, latanoprost  qhs OS Allergy to brimonidine B12 525 and folate >25.4 taken 4/15 WNL. Pt reports BP in normal range- not checked daily but pt brought her BP for the 11 days that she checked (mostly in the AM, some in PM) - range from systolic 108-161/ diastolic 51-75 .   Pt is S/P kidney transplant in July. On pred and now with elevated IOPS and HVF progression OU Make cosopt  OU bid and latanoprost  OU qhs. Add Iopidine  OU bid. Option of RHopressa but cost an issue 2. 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 Age related nuclear cataract ou : Monitor for now.  4. Chronic renal disease (unknown type): being considered for renal transplant at [facility].  Follow HVF in future, diffuse thinning on OCT with limited utility of following  Pt unsure if still on prednisone  for  kidney transplant. LTFU since 05/2017. Medically had to recover from kidney transplant, but pt reports doing better now. HVF/OCT shows no progression. Obtain HVF 10-2 in the future.   Pt taking dor (or cosopt , pt has 2 bottles of dor) 2/2, iopidine  2/2, and latan 0/1. CPM.   01/19/20 Pt lost to f/u since 01/2019. Counseled on need for compliance.  Missed gtt since yesterday. Restart Cos 2/2, Iop 2/2, and make Lat 1/1  01/21/2020 IOP still high OD  S/p phaco + BVT 350 OD 02/11/2020 (entry into prior trab capsule, so venting slits deferred)   02/12/2020  POD1 s/p phaco/BVT OD Doing well, Ta 9, VA 20/70 Corneal suture removed, moxi pre/post  PF and Cipro  QID OD Iop, Cos, lat OS only   02/19/2020 POW1 Phaco/BVT OD Comfortable, feels well VA 20/250 IOP 5/14. Deep AC, bleb, Seidel negative. No choroidals. Suspect overfiltration in setting of prior trab capsule near tube Continue Cipro  QID (until bottle runs out), Make PF TID  RTC 3 weeks, sooner PRN. Plan for additional PF taper starting next visit.   03/11/20 POM#1 s/p phaco BVT OD.  VA improved 20/40 today.  Stop Cipro  OD Taper PF to BID x 1 week, then QDay x 1 week, then stop RTC 2 weeks around time of tube opening.  If IOP elevated OD at that  time, will start Cosopt  for early hypertensive phase.  MRx at that time also.   04/01/20 IOP doing well CPM. No gtt OD. Cont gtt oS  05/14/20 Add-on for intermittent blurry vision OD VA stable, IOP 7/15 Discussed dry eye management Cont iopidine  0/2, cosopt  0/2, latan 0/1 Fellow only visit  06/14/20 IOP excellent OD borderline OS.  HVF OD with possible changes but IOP excellent. OS with flux Will check B12 and folate levels as pt has possible changes OU despite excelletn IOP OD.   Pt S/P Renal transplant. Kidney damage due to HTN.   RTC 2 m IOP  08/16/20 S/p Phaco/BVT OD 02/11/20 Doing well, no complaints IOP today 24/14 Drops in OD  -ADD cosopt  BID OD and lat OD qhs Drops in  OS  -Continue iopidine  BID, cosopt  BID, latan QHS   RTC 6 weeks IOP check, RNFL and MRX OU  10/29/20  S/p phaco/BVT OD 02/11/20 IOP 13/13 on cos 2/2, latan 1/1, iopidine  0/2 OCT RNFL OD mild change since 2019 (since then has had phaco/BVT), OS stable  MRx dispensed today Fellow only encounter  RTC 3 months HVF 24-2 OU  01/03/21 IOP 14/14 on cos 2/2, latan 1/1, iopidine  0/2 HVF 24-2 appears progressed OU since 06/2020. OD had elevated IOP after tube however OS has had IOP in mid-teens. Schedule 350 BVT OS. Goal circa 10  02/25/21 POD #0 S/P 350 BVT OS.  Doing well PF and cil qid Start cos OS bid Lat OD qhs and cos OD bid  03/03/21 POW1 s/p BVT 350 OS IOP 14/7 Doing well Make cosopt  OD only. Cont latan OD QHS PF taper OS, cipro  until bottle runs out   04/14/21 S/P 350 BVT OS doing well Taper PF to off OS  06/13/21 IOP stable   09/12/2021 IOP 11/23 POM #6 s/p BVT 350 OS Has had shoulder pain since Saturday, stiffness Accidentally used mometasone furoate (ear drop) in OS - now with mild stinging with eye drops. Stinging likely from exposed ripcord. Moxi pre and post and removed. Rip cord exposed OS - removed today with IOP=11. Suspect lower IOP likely temporary as pt already had bleb over capsule.  OCT overall stable OU (some segmentation issues OD, but macular scan stable)  Recommend making cosopt  OU and cont Latan OD Recommend CEIOL OS but may benefit from ECPC vs 2nd tube with CE. Pt reports glare OS stopping her from driving at night  Increase pres-free artificial tears 6x/day OS  10/20/21 IOP stable oU with improved compliance.  Vis signif cat OS Schedule phaco/PCIOL OS. Defer ECPC as IOP excellent when using cos OU bid and lat OD qhs  RTC OR for complex phaco/PCIOL OS.  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 Still with 1+cell Keto/pred TID OS.  02/09/22 VA improving IOP 14, 14 on cos 2/0, latan 1/0 OCT with CME Ran out of PF 2 days ago and ketorolac  1 week ago Restart Keto/pred QID OS for 1 month Make Cosopt  BID OU Latan qhs OD only  03/13/22 IOP stable OCT OS with tr CME Can taper PF and decr acular   06/15/22 IOP 17/05 No CME OS IOP borderline OD.Coming off of Pred Forte  Will see how IOP OD does Iopidine  hard to get  currently and Rhopressa cost prohibitive If IOP still elevated on return then cna do SLT same day  07/05/2022 Urgent visit for redness, pain and blurry vision OS IOP 0 with mild AC cell/flare and K edema OCT showing macular edema No leaks, no signs of infection Likely inflammation with CB shutdown Stop Cosopt  OS Start PF q2h, ketorolac  QID OS  07/31/22 IOP OD above goal OS improving Inflammation better  OD IOP may be due to steroids Will decr PF to 6x/d OS x1 wk then 5x/d x 1 wk then qid until seen  08/21/22 IOP stable Pt decr PF to qd on own USe PF and Ketorlac bid OS Cont Cos and Lat OD  09/07/2022 OCT Mac OS appears compact IOP 15/15 on cosopt  2/0, latan 1/0, PF 0/2, ketorolac  0/2.  Lower PF/keto to Qday. Stop in 4 weeks.  10/30/22 IOP above goal OS.  Make COs Ou  11/16/22 urgent visit w/ Dr. Genna for tearing OS and blurred vision - OCT mac with CME OS, and low IOP of 3. No evidence of leak. She just stopped her PF and Ketorolac  2 weeks ago. (Was on it for CME OS, and an episode of suspected CB shut down due to inflammation).  - Per prior Dr. Selestine notes, suspect that she gets CB shutdown with inflammation. - Stop IOP drops OS (continue OD),  restart PF QID and Ketorolac  QID - Will message Dr. Bethany to determine f/u.  - K edema likely secondary to low IOP  12/14/22 Taking PF 0/4, ketorolac  0/4 Taking Cosopt  2/0, Latan 1/0 HVF relatively stable OU from 4/22. Possible change OD IOP 18 OD too high, possibly from PF use OS? IOP 8 OS, good VA improved from last visit, K edema resolved Denies any recent irritation but reports vision waxes and wanes OU Add Iopidine  Od bid  Plan: Start tapering PF 3-2-1 at two week intervals RTC in 6-8 weeks for IOP check. And OCT OU  01/08/2023 IOP 15/15 on cos 2/0, lat 1/0, iopidine  2/0 Still taking PF and ketorolac  qid OS Will taper PF/ketorolac  3-2-1 at two week intervals Make cosopt  BID OU  02/19/23 IOP stable D/C PF and acular  qd OS DFE with BDR OS nl OD Rec good BS control and f/u with internist  05/10/2023 IOP 9/3 on cos2/2, iop 2/0, xal 1/0, acu 0/2 Patient objectively testing the same for Va OS but notices it subjectively being a little more blurry Taper of acular  qday x 1 week then stop OS (Oct mac without edema 01/08/23) Stop Cosopt  OS (given pt symptoms and low iop) Cont Iop2/cos2/xal1 OD   RTC 4-6 weeks, iop check, Oct mac, Oct RNFL   07/02/23 Reports decreased vision OS VA: 20/150(down from 20/30) IOP 9 and 0 Seidel neg  OCT with increased thickness OS  No choroidals on DFE or B-scan. 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 Unusual to be so low. No RD Tube seidel neg. Hyposecretion can occur due to inflammation Start atropine BID  Start PF q 2 to 3 H WA OS   07/09/23 IOP 08/02. No RD,etc seen Cont PF q 2 H WA and atropien OS  bid RTC 1 wk if no better then get B scan OS and consider Tube tie off  07/16/23 20/70(improving) IOP 10/3, stable  Exam with deep AC, ST well positioned tube  Continue to suspect hyposecretion likely  inflammatory etiology, no choroidals On Atropine BID OS Prednisolone  q2hWA OS Apraclonidine  BID OD Cosopt  BID OD Latanoprost  qhs OD CPM   08/02/2023 Continued slowly improving vision and IOP Still with flare but no cell Already using PF QID per patient (rather than Q2H) Continue PF 0/4 until next visit Trial off atropine (restart if photophobia) Continue apra 2/0, cos 2/0, xal 1/0  11/12/2023 IOP 19/16 on apra 2/0, xal 1/0, PF 0/4 AC OS quiet Restart cosopt  2/2- not using since 10/24 CME resolved, taper pred forte  off qweek OS  03/03/24 IOP 18/06 Recently had back surgery an then in rehab. States she got drops but unsure which ones.  IOP too high OD. On MTMT Rec IN tube OD.   04/17/24 POD #1 S/p IN 350 BVT OD IOP 05/08 Doing well. Wounds intact Std precautions and gtt taper.  Hold lat and Apraclonidine  OD  04/24/24 POW #1  IOP 05/06. Hold cos OD cont oS No choroidals Cont gtt taper  05/20/24  POM1 s/p IN 350 BVT OD  IOP 14 OD, 2 OS Currently using PF TID OD, Ofloxacin QID, Atropine BID. Ran out of Cosopt  yesterday.  Cornea OS with d folds/ hypotony. Bscan OS with mild scleral thickening. No RD Incr PF to qid OS. D/C cosopt  OS. COnt PF OD qd x 1 wk then stop  06/12/24 IOP OS improved to 9. VA slightly improved. Exam with improved d-folds.   Continue PF daily for 2 weeks then return 2-3 weeks for K check. If better then check MRX  07/10/24:  IOP 10, 14 on PF 0/4 Start PF taper given K edema and IOP improved  Chalazion formation OD temporally  Start WC and emycin ung TID OD    09/04/2024  Here for Urgent visit for new floaters OS  Patient reports large black floaters OS since last appointment (OCT 2025)  They have been stable w/o associated flashes/floaters/curtain in vision  VA stable  IOP 10/12 off IOP drops  DFE without any new retinal breaks  Return precautions reviewed   10/13/24 IOP 18/10. Borderline OD Restart Lat OD qhs  RTC 70m IOP and OCT RNFL OU and  MRX OU   Keep scheduled f/u for IOP and MRX OU Brim- allergy CME OS DFE 02/19/23  The Chief Complaint, HPI, ROS and PFSHx as documented were reviewed and I agree as documented, or /vised as indicated

## 2024-10-13 NOTE — Telephone Encounter (Signed)
 Received following fax from office please advise:

## 2024-10-13 NOTE — Telephone Encounter (Signed)
Ok to send as pended.

## 2024-10-13 NOTE — Telephone Encounter (Signed)
 Ok to do this - does it need to go through [facility] online portal?

## 2024-10-14 ENCOUNTER — Other Ambulatory Visit: Payer: Self-pay | Admitting: Family Medicine

## 2024-10-14 DIAGNOSIS — M25552 Pain in left hip: Secondary | ICD-10-CM

## 2024-10-14 DIAGNOSIS — M4807 Spinal stenosis, lumbosacral region: Secondary | ICD-10-CM

## 2024-10-14 NOTE — Telephone Encounter (Signed)
 Received message from referral team it has been sent and approved.  No further action needed at this time.

## 2024-10-15 ENCOUNTER — Ambulatory Visit
Admission: RE | Admit: 2024-10-15 | Discharge: 2024-10-15 | Disposition: A | Discharge status: Home / Self Care | Source: Ambulatory Visit | Attending: Family Medicine | Admitting: Family Medicine

## 2024-10-15 DIAGNOSIS — M4807 Spinal stenosis, lumbosacral region: Secondary | ICD-10-CM

## 2024-10-15 DIAGNOSIS — M25552 Pain in left hip: Secondary | ICD-10-CM

## 2024-10-17 ENCOUNTER — Ambulatory Visit

## 2024-10-17 ENCOUNTER — Telehealth: Payer: Self-pay | Admitting: Neurosurgery

## 2024-10-17 VITALS — BP 135/75 | HR 74 | Ht 63.0 in | Wt 131.6 lb

## 2024-10-17 DIAGNOSIS — M25552 Pain in left hip: Secondary | ICD-10-CM

## 2024-10-17 NOTE — Telephone Encounter (Signed)
 error

## 2024-10-17 NOTE — Progress Notes (Signed)
 "  Office Visit Note   Patient: Kristen Kirk           Date of Birth: 23-Aug-1951           MRN: [id number] Visit Date: 10/17/2024              Requested by: Rilla Baller, MD [address] [place],  [place] [address] PCP: Rilla Baller, MD   Assessment & Plan: Visit Diagnoses:  1. Pain in left hip     Plan: Natural history and expected course discussed. Questions answered. Patients hip pain not responsive to hip injection, unlikely to be hip. Suspect pain is coming from her back. Referred back to see Neurosurgery for evaluation and treatment.   Orders:  No orders of the defined types were placed in this encounter.    Subjective: Chief Complaint: Left hip pain  HPI Patient is a 74 y.o. year old female who complains of left hip pain. Onset of the symptoms was several years ago. Inciting event: none. Current symptoms include inability to walk, groin pain, and buttock pain. Aggravating symptoms: rising after sitting. Patient's course of pain: gradually worsening.  Reports that pain started after she had lumbar spine fusion. No other complaints. Had intraarticular injection left hip which she reports did not help at all.  Objective: Vital Signs: BP 135/75   Pulse 74   Ht 5' 3 (1.6 m)   Wt 131 lb 9.6 oz (59.7 kg)   LMP  (LMP Unknown)   BMI 23.31 kg/m   Physical Exam Gen: Alert, No Acute Distress left hip: Skin intact, no erythema or induration noted. groin and buttock tenderness to palpation. Full range of motion. positive log roll  Imaging: Radiographs personally reviewed by me; reveal mild osteoarthritis of the left hip  CT scan: CT left hip reveals mild degenerative changes in the left hip  FINDINGS:   BONES AND ALIGNMENT: Normal vertebral body heights. No acute fracture or suspicious bone lesion. Normal alignment except for 4 mm of anterolisthesis of the L4 vertebral body on L5 and 3 mm degenerative retrolisthesis at L1-L2 and L2-L3. Sacral  nerve stimulator with lead extending through the left posterior S3 foramen and pulse generator on the right. Posterolateral rod and pedicle screw fixation at L4-L5-S1 with interbody spacers. Comparison left L4 and L5 laminectomies.   DEGENERATIVE CHANGES: T12-L1: Unremarkable. L1-L2: Borderline right foraminal stenosis due to facet arthropathy. Disc bulge noted. L2-L3: Moderate bilateral foraminal stenosis and borderline central stenosis due to disc bulge, suspected right lateral recess disc protrusion, and facet arthropathy. L3-L4: Moderate to severe central stenosis and moderate to severe bilateral foraminal stenosis due to disc bulge, ligamentum flavum redundancy, facet arthropathy, and intervertebral spurring. L4-L5: Indeterminate but likely moderate central stenosis and moderate bilateral foraminal stenosis due to facet and intervertebral spurring. L5-S1: At least moderate left foraminal stenosis due to intervertebral spurring, with borderline right foraminal stenosis.   SOFT TISSUES: No acute abnormality. Severe bilateral native renal atrophy. Systemic atherosclerosis is present, including the aorta and iliac arteries. Right pelvic transplant kidney. Sigmoid colon diverticulosis.   IMPRESSION: 1. Multilevel lumbar spondylosis with stenosis, including borderline right foraminal stenosis at L1-2, moderate bilateral foraminal and borderline central stenosis at L2-3, moderate to severe central and moderate to severe bilateral foraminal stenosis at L3-4, likely moderate central and moderate bilateral foraminal stenosis at L4-5, and at least moderate left foraminal stenosis with borderline right foraminal stenosis at L5-S1. 2. Degenerative listhesis with 4 mm anterolisthesis of L4 on L5,  and 3 mm degenerative retrolisthesis at L1-2 and L2-3. 3. Posterolateral rod and pedicle screw fixation at L4, L5, and S1 with interbody spacers, with left L4 and L5 laminectomies. 4. Sacral  nerve stimulator with lead through the left posterior S3 foramen and pulse generator on the right. 5. No acute osseous abnormality. 6. Severe bilateral native renal atrophy, with right pelvic transplant kidney. 7. Systemic atherosclerosis including the aorta and iliac arteries. 8. Sigmoid colon diverticulosis.   Electronically signed by: Ryan Salvage MD 10/15/2024 02:13 PM EST RP Workstation: [id number]  PMFS History: Patient Active Problem List   Diagnosis Date Noted   S/P lumbar fusion 02/13/2024   Spondylolisthesis of lumbar region 11/05/2023   Spinal stenosis of lumbar region 11/05/2023   Lumbar radiculopathy 11/05/2023   Secondary corneal edema of left eye 08/02/2023   Cystoid macular degeneration, 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   Anemia in chronic kidney disease    Essential hypertension    Type II diabetes mellitus with renal manifestations (HCC)    Immunosuppressed status 04/15/2022   Health maintenance examination 04/14/2022   Constipation due to outlet dysfunction    Acquired leg length discrepancy 01/28/2021   Thoracic spine pain 01/05/2020   Chronic lower back pain 10/08/2019   Osteoporosis 05/03/2019   Advanced care planning/counseling discussion 03/20/2019   MCI (mild cognitive impairment) with memory loss 03/20/2019   History of renal transplant 04/01/2017   Incontinence of feces 01/11/2017    Class: Acute   Insomnia 01/14/2013   Xerostomia 01/29/2012   Medicare annual wellness visit, subsequent 07/20/2011   Vaginal atrophy 04/17/2011   Type 2 diabetes mellitus with other specified complication (HCC)    Hyperlipidemia associated with type 2 diabetes mellitus (HCC)    GERD (gastroesophageal reflux disease)    Mixed incontinence urge and stress    Vitamin D  deficiency 05/19/2009   MUSCLE CRAMPS 02/22/2009   Alopecia 10/05/2008   Vertigo 07/31/2008   Osteoarthritis 01/03/2007   Primary open angle glaucoma (POAG) of both eyes, severe stage 10/03/1999   Past  Medical History:  Diagnosis Date   Anemia in chronic kidney disease    a. s/p aranesp 11/2013 now starting epo with HD 06/2014   Chronic low back pain    CKD (chronic kidney disease), stage V (HCC) 01/2014   Diabetes mellitus type II    a. Currently diet controlled since losing weight.   DJD (degenerative joint disease)    Essential hypertension    Fecal incontinence    decreased internal sphincter function   GERD (gastroesophageal reflux disease)    History of echocardiogram    a. 01/2015 Echo [facility]): EF ?55%, triv MR, mildly dil LA, nl PV.   History of end stage renal disease    previous on HD;   received kidney transplant 07/ 2018 (on dialysis since 2015 MWF)  followed by [facility] renal (Dr. Sigmund) considering transplant Access: LUE fistula Establishing with [facility]   History of stress test    a. 03/2013 [facility]): EF 63%, no ischemia.   History of syncope    a. 12/2015.   History of Urge Incontinence    HLD (hyperlipidemia)    Immunosuppressed status    Mixed incontinence urge and stress    Osteoarthritis    Osteoporosis 05/03/2019   DEXA 04/2019 - 1.5 spine, -2.1 hip, -2.5 forearm Check phosphate and PTH to guide further management in h/o kidney transplant   Primary open angle glaucoma (POAG) of both eyes    a. L>R  S/P kidney transplant 04/2017   @[facility] ---   per pt one kidney from living-donor   Spinal stenosis of lumbar region    Spondylolisthesis of lumbar region    Vaginal atrophy    a. vagifem  caused spotting, normal TVS   Vitamin D  deficiency    Wears glasses    stated on 05/31/2022   Wears partial dentures    upper 2 teeth. 05/31/2022    Family History  Problem Relation Age of Onset   Other Father        she did not know her father.   Diabetes Mother    Heart failure Mother        died @ 92   Stroke Brother    Hyperlipidemia Brother    Hypertension Brother    Hyperlipidemia Brother    Hypertension Brother    Hyperlipidemia Sister     Hypertension Sister    Hyperlipidemia Sister    Hypertension Sister    Heart failure Maternal Grandfather    Breast cancer Neg Hx    Colon cancer Neg Hx    Esophageal cancer Neg Hx    Stomach cancer Neg Hx    Rectal cancer Neg Hx     Past Surgical History:  Procedure Laterality Date   ANAL RECTAL MANOMETRY N/A 03/16/2021   Procedure: ANO RECTAL MANOMETRY;  Surgeon: Nandigam, Kavitha V, MD;  Location: [facility];  Service: Endoscopy;  Laterality: N/A;   APPLICATION OF INTRAOPERATIVE CT SCAN N/A 01/03/2024   Procedure: APPLICATION OF INTRAOPERATIVE CT SCAN;  Surgeon: Claudene Penne ORN, MD;  Location: [facility] OR[facility];  Service: Neurosurgery;  Laterality: N/A;   AQUEOUS SHUNT Left 02/25/2021   AV FISTULA PLACEMENT, RADIOCEPHALIC Left 02/20/2014   BLADDER SUSPENSION  2011   for stress incontinence-Dr. MacDiarmid   CATARACT EXTRACTION W/ INTRAOCULAR LENS IMPLANT Right 02/11/2020   CATARACT EXTRACTION W/ INTRAOCULAR LENS IMPLANT Left 11/23/2021   COLONOSCOPY  03/2019   diverticulosis, rpt 10 yrs (Nandigam)   KIDNEY TRANSPLANT Right 04/02/2017   [facility]   LIGATION OF ARTERIOVENOUS  FISTULA Left 10/30/2017   THROMBOSED AVF   NEPHRECTOMY TRANSPLANTED ORGAN     REFRACTIVE SURGERY     REFRACTIVE SURGERY Left 06/2011   SACRAL NERVE STIMULATOR PLACEMENT  07/2022   TRABECULECTOMY Right 04/17/2012   TRANSFORAMINAL LUMBAR INTERBODY FUSION W/ MIS 2 LEVEL N/A 01/03/2024   Procedure: MINIMALLY INVASIVE (MIS) TRANSFORAMINAL LUMBAR INTERBODY FUSION (TLIF) 2 LEVEL;  Surgeon: Claudene Penne ORN, MD;  Location: [facility] ORS;  Service: Neurosurgery;  Laterality: N/A;  L4-S1 MAXIMUM ACCESS (MAS) TRANSFORAMINAL LUMBAR INTERBODY FUSION (TLIF), LEFT APPROACH   TUBAL LIGATION Bilateral    1990s   Social History   Occupational History   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  Vaping Use   Vaping status: Never Used  Substance and Sexual Activity   Alcohol  use: No    Alcohol /week: 0.0 standard drinks of alcohol    Drug use: No   Sexual activity: Yes    Partners: Male   Current Outpatient Medications  Medication Instructions   acetaminophen  (TYLENOL ) 500 mg, 3 times daily PRN   alendronate  (FOSAMAX ) 70 mg, Oral, Every 7 days, Take with a full glass of water   on an empty stomach.   amLODipine  (NORVASC ) 2.5 mg, Daily   apraclonidine  (IOPIDINE ) 0.5 % ophthalmic solution 1 drop, 2 times daily   aspirin  81 mg, Daily   atropine 1 % ophthalmic solution 1 drop, 2 times daily   Blood Glucose Monitoring Suppl (ONE TOUCH ULTRA SYSTEM KIT) W/DEVICE KIT 1 kit, Does not apply,  Once   Carboxymethylcellulose Sodium 0.25 % SOLN 1 drop, As needed   carvedilol  (COREG ) 12.5 mg, 2 times daily with meals   ciprofloxacin  (CILOXAN ) 0.3 % ophthalmic solution 1 drop, 4 times daily   cyclobenzaprine  (FLEXERIL ) 5 mg, Oral, 3 times daily PRN   docusate sodium  (COLACE) 100 mg, 2 times daily PRN   dorzolamide -timolol  (COSOPT ) 2-0.5 % ophthalmic solution 1 drop, 2 times daily   glucose blood test strip Check sugars once daily   hydrochlorothiazide  (HYDRODIURIL ) 12.5 mg, Oral, Daily   HYDROcodone -acetaminophen  (NORCO/VICODIN) 5-325 MG tablet 1 tablet, Oral, Every 4 hours PRN   latanoprost  (XALATAN ) 0.005 % ophthalmic solution 1 drop, Daily at bedtime   Melatonin 3-6 mg, Oral, At bedtime PRN   Multiple Vitamin (MULTIVITAMIN) tablet 1 tablet, Daily   mycophenolate  (CELLCEPT ) 500 mg, 2 times daily   pantoprazole  (PROTONIX ) 20 mg, Oral, Daily   pravastatin  (PRAVACHOL ) 80 mg, Oral, Every evening   prednisoLONE  acetate (PRED FORTE ) 1 % ophthalmic suspension 1 drop, 3 times daily   senna (SENOKOT) 8.6 mg, Oral, 2 times daily   tacrolimus  (PROGRAF ) 0.5-1 mg, See admin instructions   traZODone   (DESYREL ) 25-50 mg, Oral, At bedtime PRN   Vitamin D  2,000 Units, Daily   Allergies as of 10/17/2024 - Review Complete 09/25/2024  Allergen Reaction Noted   Shellfish protein-containing drug products Swelling 03/02/2014   Baclofen  Other (See Comments) 09/08/2024   Brimonidine  03/02/2014   Tramadol   01/05/2020   "

## 2024-10-20 ENCOUNTER — Other Ambulatory Visit: Payer: Self-pay | Admitting: Family Medicine

## 2024-10-20 ENCOUNTER — Ambulatory Visit: Payer: Self-pay

## 2024-10-20 DIAGNOSIS — K219 Gastro-esophageal reflux disease without esophagitis: Secondary | ICD-10-CM

## 2024-10-20 DIAGNOSIS — E1169 Type 2 diabetes mellitus with other specified complication: Secondary | ICD-10-CM

## 2024-10-20 NOTE — Telephone Encounter (Signed)
 FYI Only or Action Required?: Action required by provider: appointment booked outside dispo 1/28 pt requesting to see PCP only .  Patient was last seen in primary care on 09/01/2024 by Rilla Baller, MD.  Called Nurse Triage reporting Hip Pain.  Symptoms began several months ago.  Interventions attempted: OTC medications: tylenol  .  Symptoms are: stable.  Triage Disposition: Discuss With PCP and Callback by Nurse Today (overriding See PCP When Office is Open (Within 3 Days))  Patient/caregiver understands and will follow disposition?: No, wishes to speak with PCP                      Reason for Disposition  [1] MODERATE pain (e.g., interferes with normal activities, limping) AND [2] present > 3 days  Answer Assessment - Initial Assessment Questions Patient reports after spinal surgery , left hip pain.  Ongoing for few months. Pain varies never below 8/10 but can be 10/10. Pain with walking , has to bend over when walking. Pain in left goes into thigh sometimes. No redness of hip. No swelling of legs. Started muscle relaxers did not help. Tylenol  helps a little to moderate. Wants to see PCP only for this. Booked appoiontment soonest with PCP 10/29/2024 per pt request will route to office as was booked outside dispo.    1. LOCATION and RADIATION: Where is the pain located? Does the pain spread (shoot) anywhere else?     Left hip sometimes into left thigh 2. QUALITY: What does the pain feel like?  (e.g., sharp, dull, aching, burning)     Aching  3. SEVERITY: How bad is the pain? What does it keep you from doing?   (Scale 1-10; or mild, moderate, severe)     8/10 sometimes 10/10  4. ONSET: When did the pain start? Does it come and go, or is it there all the time?     Started several months ago after spinal surgery   6. CAUSE: What do you think is causing the hip pain?      Thinks started after surgery  7. AGGRAVATING FACTORS: What makes the hip  pain worse? (e.g., walking, climbing stairs, running)     Moving around walking  8. OTHER SYMPTOMS: Do you have any other symptoms? (e.g., back pain, pain shooting down leg,  fever, rash)     Pain is into left thigh sometimes Patient denies the following chest pain, shortness of breath , issues with bowel and bladder, fever, vomiting, numbness  Protocols used: Hip Pain-A-AH  Message from Mesmerise C sent at 10/20/2024 10:59 AM EST  Reason for Triage: Patient states she's been experiencing constant hip pain for the last several months since she's had spinal surgery and evr since has been hurting

## 2024-10-21 MED ORDER — HYDROCHLOROTHIAZIDE 12.5 MG PO TABS: 12.5000 mg | ORAL_TABLET | Freq: Every day | ORAL | 3 refills | Status: AC

## 2024-10-21 MED ORDER — PANTOPRAZOLE SODIUM 20 MG PO TBEC: 20.0000 mg | DELAYED_RELEASE_TABLET | Freq: Every day | ORAL | 3 refills | Status: AC

## 2024-10-21 MED ORDER — ALENDRONATE SODIUM 70 MG PO TABS: 70.0000 mg | ORAL_TABLET | ORAL | 3 refills | Status: AC

## 2024-10-21 NOTE — Telephone Encounter (Signed)
 ERx meds

## 2024-10-21 NOTE — Telephone Encounter (Signed)
 Noted. She did not respond to hip injection so they think it's coming from spine.  She has PM&R f/u on Friday and neurosurgery f/u next week.  Do recommend keeping these appointments.   Does she still have hydrocodone  prescribed last month?  Is there a pain medication that she has benefited from and tolerated well? If she'd like I could prescribe this while we await above appointments.

## 2024-10-21 NOTE — Telephone Encounter (Signed)
 Pt states that she no longer has hydrocodone  from last month and is not currently taking anything for pain. States she took gabapentin  in the past for pain and felt she benefited best from this. Pt states she would appreciate this for the pain as she is hurting a lot. Pt states she will go to these appts.

## 2024-10-22 ENCOUNTER — Other Ambulatory Visit: Payer: Self-pay | Admitting: Family Medicine

## 2024-10-22 MED ORDER — GABAPENTIN 300 MG PO CAPS: 300.0000 mg | ORAL_CAPSULE | Freq: Every day | ORAL | 1 refills | Status: AC

## 2024-10-22 NOTE — Telephone Encounter (Signed)
 Please notify I have sent in gabapentin  300mg  to [facility] pharmacy to take 1 capsule nightly for a few days and if doing well, may increase to twice daily as tolerated. Update us  with effect.

## 2024-10-22 NOTE — Progress Notes (Signed)
 ERx gabapentin  300mg  nightly with option to tolerate if helpful.

## 2024-10-22 NOTE — Telephone Encounter (Signed)
 Not able to leave message voice mail not set up.

## 2024-10-23 NOTE — Telephone Encounter (Signed)
 Called patient reviewed all information and repeated back to me. Will call to update us . States she is taking 1 tablet as of today but is still having pain, states she will increase as directed. Pt has no questions or concerns at this time

## 2024-10-28 ENCOUNTER — Other Ambulatory Visit: Payer: Self-pay | Admitting: Family Medicine

## 2024-10-28 DIAGNOSIS — Z1231 Encounter for screening mammogram for malignant neoplasm of breast: Secondary | ICD-10-CM

## 2024-10-29 ENCOUNTER — Ambulatory Visit: Admitting: Family Medicine

## 2024-11-03 ENCOUNTER — Encounter

## 2024-11-04 ENCOUNTER — Encounter

## 2024-11-04 ENCOUNTER — Ambulatory Visit

## 2024-11-05 ENCOUNTER — Ambulatory Visit: Admitting: Physician Assistant

## 2024-11-05 ENCOUNTER — Encounter: Payer: Self-pay | Admitting: Neurosurgery

## 2024-11-05 ENCOUNTER — Ambulatory Visit: Admitting: Neurosurgery

## 2024-11-05 VITALS — BP 118/60 | Ht 63.0 in | Wt 133.0 lb

## 2024-11-05 DIAGNOSIS — M5416 Radiculopathy, lumbar region: Secondary | ICD-10-CM | POA: Diagnosis not present

## 2024-11-05 DIAGNOSIS — M48061 Spinal stenosis, lumbar region without neurogenic claudication: Secondary | ICD-10-CM | POA: Diagnosis not present

## 2024-11-05 DIAGNOSIS — Z981 Arthrodesis status: Secondary | ICD-10-CM | POA: Diagnosis not present

## 2024-11-05 DIAGNOSIS — M51369 Other intervertebral disc degeneration, lumbar region without mention of lumbar back pain or lower extremity pain: Secondary | ICD-10-CM

## 2024-11-05 NOTE — Progress Notes (Signed)
 "  REFERRING PHYSICIAN:  Rilla Baller, Md [address] [place],  [place] [address]  DOS: 01/03/24 MIS TLIF at L4-S1, left approach   Discussed the use of AI scribe software for clinical note transcription with the patient, who gave verbal consent to proceed.  History of Present Illness Kristen Kirk is a 74 year old female with prior lumbar fusion who presents with persistent left groin and anterior thigh pain.  She has constant severe pain in the left groin radiating down the anterior left thigh to just above the knee, without extension below the knee or right leg symptoms. The pain is radiating and debilitating.  Standing upright worsens both back and leg pain, and she walks bent forward due to discomfort.  Her prior lumbar fusion was for back and radiating leg pain at the lower lumbar levels. Since that surgery, she has developed new pain above the fusion, with marked worsening of pain and functional limitation in November and December.  A left hip injection by orthopedics gave no relief. She is scheduled for spinal injections at L3-4 and L4-5, and her family is trying to expedite this due to uncontrolled pain.  Her current pain regimen is gabapentin  300 mg as needed and intermittent hydrocodone  for severe pain. Gabapentin  can cause morning sleepiness if taken late, though she denies significant sedation. Her family reports prior confusion with regular narcotic use, but she has not had this with the current intermittent dosing.  Her family asks about alternative therapies including acupuncture, electrostimulation, chiropractic manipulation, and oral liquid collagen. She has avoided chiropractic care because of her spinal fusion. She has not used muscle relaxants for this episode and reports no benefit from them in the past.  PHYSICAL EXAMINATION:  General: Patient is well developed, well nourished, calm, collected, and in no apparent distress.   NEUROLOGICAL:  General: In no  acute distress.   Awake, alert, oriented to person, place, and time.  Pupils equal round and reactive to light.  Facial tone is symmetric.    Strength:            Side Iliopsoas Quads Hamstring PF DF EHL  R 5 5 5 5 5 5   L 5 5 5 5 5 5    Incision c/d/i   ROS (Neurologic):  Negative except as noted above  IMAGING:  X-rays pending  Assessment and Plan Assessment & Plan Adjacent segment disease with lumbar radiculopathy, post lumbar fusion She developed adjacent segment disease above her prior lumbar fusion, resulting in severe foraminal stenosis and nerve root compression at L2 and L3 (left greater than right) on recent CT. This causes mechanical nerve compression with neuropathic pain in the left groin and anterior thigh, and significant functional impairment, including inability to ambulate upright. Symptoms are distinct from her pre-fusion pain and directly related to increased stress and degeneration at levels above the prior fusion. Conservative management is preferred due to her prior difficult postoperative course. Surgical intervention would be considered only if non-surgical measures fail, given the risks and morbidity of repeat decompression and extension of fusion. Alternative therapies have limited evidence for efficacy in mechanical nerve compression. Spinal cord stimulator may be considered for refractory unilateral radicular pain if she is not a surgical candidate or declines further surgery. Radiofrequency ablation is not appropriate for radicular pain but may be considered for axial back pain. - Continue gabapentin  as prescribed, dosing at supper to minimize morning sedation and optimize neuropathic pain control. - Use hydrocodone  only for intolerable breakthrough  pain, not as a routine analgesic; contact primary care for refills if needed. - Proceed with scheduled lumbar epidural steroid injections at L3-L4 and L4-L5 with physical medicine and rehabilitation on February 18th. -  Follow up via phone or video in the first week of March to assess response to injections. - If injections are ineffective, consider repeat decompressive surgery at affected levels or spinal cord stimulator for refractory unilateral radicular pain. - Radiofrequency ablation may be considered for axial back pain, not for radicular leg pain. - Reinforced conservative management and avoidance of major surgery unless absolutely necessary due to prior difficult recovery.  Kristen LELON Sharps, MD Department of neurosurgery    "

## 2024-11-06 ENCOUNTER — Ambulatory Visit: Payer: Self-pay

## 2024-11-06 NOTE — Telephone Encounter (Signed)
 Pt has run out of hydrocodone  and is requesting more to keep her comfortable as she waits for her pain injection on 2/18. Pt states she takes gabapentin  nightly but it does not take the pain away. Pt referred to her neuro surgeon's office and instructed to call back if they are unable to assist. Pt agreeable.    FYI Only or Action Required?: FYI only for provider: pt referred back to her neuro surgeon.  Patient was last seen in primary care on 09/01/2024 by Rilla Baller, MD.  Called Nurse Triage reporting Hip Pain.  Symptoms began several months ago.  Interventions attempted: Prescription medications: gabapentin  and tylenol .  Symptoms are: . unchanged Triage Disposition: Call Specialist Now  Patient/caregiver understands and will follow disposition?: Yes  Reason for Triage: Patient having pain in hip and spine - would like HYDROcodone -acetaminophen  (NORCO/VICODIN) 5-325 MG tablet - injection isn't until 11/19/24 and wants to know what she can do until then   Reason for Disposition  [1] SEVERE back pain (e.g., excruciating, unable to do any normal activities) AND [2] not improved 2 hours after pain medicine  Answer Assessment - Initial Assessment Questions 1. ONSET: When did the pain begin? (e.g., minutes, hours, days)     Ongoing since lumbar fusion in October  2. LOCATION: Where does it hurt? (upper, mid or lower back)     L lower back   3. SEVERITY: How bad is the pain?  (e.g., Scale 1-10; mild, moderate, or severe)     10/10  4. PATTERN: Is the pain constant? (e.g., yes, no; constant, intermittent)      Constant, worse after sitting down, pt expresses she struggles to stand from a sitting position   6. CAUSE:  What do you think is causing the back pain?      S/P lumbar fusion  Protocols used: Back Pain-A-AH

## 2024-12-10 ENCOUNTER — Ambulatory Visit: Admitting: Neurosurgery

## 2025-03-02 ENCOUNTER — Ambulatory Visit: Admitting: Family Medicine
